# Patient Record
Sex: Female | Born: 1945 | Race: Black or African American | Hispanic: No | Marital: Married | Smoking: Former smoker
Health system: Southern US, Community
[De-identification: ages and names within clinical notes are randomized; demographics above are authoritative.]

## PROBLEM LIST (undated history)

## (undated) DIAGNOSIS — B0229 Other postherpetic nervous system involvement: Secondary | ICD-10-CM

## (undated) DIAGNOSIS — D869 Sarcoidosis, unspecified: Secondary | ICD-10-CM

## (undated) DIAGNOSIS — J449 Chronic obstructive pulmonary disease, unspecified: Secondary | ICD-10-CM

## (undated) DIAGNOSIS — Z9089 Acquired absence of other organs: Secondary | ICD-10-CM

## (undated) DIAGNOSIS — M1990 Unspecified osteoarthritis, unspecified site: Secondary | ICD-10-CM

## (undated) DIAGNOSIS — D860 Sarcoidosis of lung: Secondary | ICD-10-CM

## (undated) DIAGNOSIS — T7840XA Allergy, unspecified, initial encounter: Secondary | ICD-10-CM

## (undated) DIAGNOSIS — M810 Age-related osteoporosis without current pathological fracture: Secondary | ICD-10-CM

## (undated) DIAGNOSIS — H409 Unspecified glaucoma: Secondary | ICD-10-CM

## (undated) DIAGNOSIS — D649 Anemia, unspecified: Secondary | ICD-10-CM

## (undated) DIAGNOSIS — I10 Essential (primary) hypertension: Secondary | ICD-10-CM

## (undated) DIAGNOSIS — C569 Malignant neoplasm of unspecified ovary: Secondary | ICD-10-CM

## (undated) HISTORY — PX: DILATION AND CURETTAGE OF UTERUS: SHX78

## (undated) HISTORY — PX: VULVAR LESION REMOVAL: SHX5391

## (undated) HISTORY — DX: Allergy, unspecified, initial encounter: T78.40XA

## (undated) HISTORY — DX: Unspecified osteoarthritis, unspecified site: M19.90

## (undated) HISTORY — PX: ABDOMINAL HYSTERECTOMY: SHX81

## (undated) HISTORY — PX: EYE SURGERY: SHX253

## (undated) HISTORY — PX: DIAGNOSTIC LAPAROSCOPY: SUR761

## (undated) HISTORY — PX: BREAST EXCISIONAL BIOPSY: SUR124

## (undated) HISTORY — DX: Age-related osteoporosis without current pathological fracture: M81.0

---

## 1999-10-11 DIAGNOSIS — D860 Sarcoidosis of lung: Secondary | ICD-10-CM | POA: Insufficient documentation

## 2004-03-25 ENCOUNTER — Ambulatory Visit: Payer: Self-pay | Admitting: Internal Medicine

## 2005-04-08 ENCOUNTER — Ambulatory Visit: Payer: Self-pay | Admitting: Internal Medicine

## 2006-04-13 ENCOUNTER — Ambulatory Visit: Payer: Self-pay | Admitting: Internal Medicine

## 2007-04-21 ENCOUNTER — Ambulatory Visit: Payer: Self-pay | Admitting: Internal Medicine

## 2008-04-23 ENCOUNTER — Ambulatory Visit: Payer: Self-pay | Admitting: Internal Medicine

## 2008-11-19 ENCOUNTER — Ambulatory Visit: Payer: Self-pay | Admitting: Gastroenterology

## 2009-04-29 ENCOUNTER — Ambulatory Visit: Payer: Self-pay | Admitting: Internal Medicine

## 2009-05-31 ENCOUNTER — Emergency Department: Payer: Self-pay | Admitting: Emergency Medicine

## 2009-12-09 ENCOUNTER — Ambulatory Visit: Payer: Self-pay | Admitting: Internal Medicine

## 2009-12-11 ENCOUNTER — Ambulatory Visit: Payer: Self-pay | Admitting: Gynecologic Oncology

## 2009-12-11 DIAGNOSIS — C569 Malignant neoplasm of unspecified ovary: Secondary | ICD-10-CM | POA: Insufficient documentation

## 2009-12-17 ENCOUNTER — Ambulatory Visit: Payer: Self-pay | Admitting: Internal Medicine

## 2009-12-23 ENCOUNTER — Ambulatory Visit: Payer: Self-pay | Admitting: Gynecologic Oncology

## 2010-01-10 ENCOUNTER — Ambulatory Visit: Payer: Self-pay | Admitting: Gynecologic Oncology

## 2010-04-30 ENCOUNTER — Ambulatory Visit: Payer: Self-pay | Admitting: Internal Medicine

## 2011-05-04 ENCOUNTER — Ambulatory Visit: Payer: Self-pay | Admitting: Internal Medicine

## 2011-05-25 ENCOUNTER — Emergency Department: Payer: Self-pay | Admitting: Emergency Medicine

## 2011-05-25 LAB — BASIC METABOLIC PANEL
Anion Gap: 7 (ref 7–16)
BUN: 20 mg/dL — ABNORMAL HIGH (ref 7–18)
Calcium, Total: 9.8 mg/dL (ref 8.5–10.1)
Chloride: 98 mmol/L (ref 98–107)
Co2: 33 mmol/L — ABNORMAL HIGH (ref 21–32)
Creatinine: 0.72 mg/dL (ref 0.60–1.30)
EGFR (African American): >60
EGFR (Non-African Amer.): >60
Glucose: 87 mg/dL (ref 65–99)
Osmolality: 278 (ref 275–301)
Potassium: 4 mmol/L (ref 3.5–5.1)
Sodium: 138 mmol/L (ref 136–145)

## 2011-05-25 LAB — CBC WITH DIFFERENTIAL/PLATELET
Basophil #: 0 10*3/uL (ref 0.0–0.1)
Basophil %: 0.6 %
Eosinophil #: 0 10*3/uL (ref 0.0–0.7)
Eosinophil %: 0.6 %
HCT: 36.3 % (ref 35.0–47.0)
HGB: 11.7 g/dL — ABNORMAL LOW (ref 12.0–16.0)
Lymphocyte #: 0.6 10*3/uL — ABNORMAL LOW (ref 1.0–3.6)
Lymphocyte %: 9 %
MCH: 27.2 pg (ref 26.0–34.0)
MCHC: 32.3 g/dL (ref 32.0–36.0)
MCV: 84 fL (ref 80–100)
Monocyte #: 0.5 10*3/uL (ref 0.0–0.7)
Monocyte %: 8.1 %
Neutrophil #: 5.4 10*3/uL (ref 1.4–6.5)
Neutrophil %: 81.7 %
Platelet: 315 10*3/uL (ref 150–440)
RBC: 4.32 10*6/uL (ref 3.80–5.20)
RDW: 13.3 % (ref 11.5–14.5)
WBC: 6.6 10*3/uL (ref 3.6–11.0)

## 2011-05-25 LAB — SEDIMENTATION RATE: Erythrocyte Sed Rate: 48 mm/hr — ABNORMAL HIGH (ref 0–30)

## 2011-08-05 ENCOUNTER — Ambulatory Visit: Payer: Self-pay | Admitting: Pain Medicine

## 2011-08-16 ENCOUNTER — Ambulatory Visit: Payer: Self-pay | Admitting: Pain Medicine

## 2011-08-19 ENCOUNTER — Ambulatory Visit: Payer: Self-pay | Admitting: Pain Medicine

## 2011-08-31 ENCOUNTER — Emergency Department: Payer: Self-pay | Admitting: Internal Medicine

## 2011-08-31 LAB — CBC
HCT: 39.5 % (ref 35.0–47.0)
HGB: 12.1 g/dL (ref 12.0–16.0)
MCH: 26.8 pg (ref 26.0–34.0)
MCHC: 30.7 g/dL — ABNORMAL LOW (ref 32.0–36.0)
MCV: 87 fL (ref 80–100)
Platelet: 304 10*3/uL (ref 150–440)
RBC: 4.52 10*6/uL (ref 3.80–5.20)
RDW: 13.7 % (ref 11.5–14.5)
WBC: 16.3 10*3/uL — ABNORMAL HIGH (ref 3.6–11.0)

## 2011-08-31 LAB — COMPREHENSIVE METABOLIC PANEL
Albumin: 4.2 g/dL (ref 3.4–5.0)
Alkaline Phosphatase: 53 U/L (ref 50–136)
Anion Gap: 11 (ref 7–16)
BUN: 29 mg/dL — ABNORMAL HIGH (ref 7–18)
Bilirubin,Total: 0.4 mg/dL (ref 0.2–1.0)
Calcium, Total: 9.8 mg/dL (ref 8.5–10.1)
Chloride: 97 mmol/L — ABNORMAL LOW (ref 98–107)
Co2: 28 mmol/L (ref 21–32)
Creatinine: 0.82 mg/dL (ref 0.60–1.30)
EGFR (African American): >60
EGFR (Non-African Amer.): >60
Glucose: 114 mg/dL — ABNORMAL HIGH (ref 65–99)
Osmolality: 279 (ref 275–301)
Potassium: 3.4 mmol/L — ABNORMAL LOW (ref 3.5–5.1)
SGOT(AST): 27 U/L (ref 15–37)
SGPT (ALT): 21 U/L
Sodium: 136 mmol/L (ref 136–145)
Total Protein: 9 g/dL — ABNORMAL HIGH (ref 6.4–8.2)

## 2011-08-31 LAB — TROPONIN I: Troponin-I: <0.02 ng/mL

## 2011-08-31 LAB — URINALYSIS, COMPLETE
Bilirubin,UR: NEGATIVE
Blood: NEGATIVE
Glucose,UR: NEGATIVE mg/dL (ref 0–75)
Hyaline Cast: 6
Nitrite: NEGATIVE
Ph: 5 (ref 4.5–8.0)
Protein: 30
RBC,UR: 6 /HPF (ref 0–5)
Specific Gravity: 1.024 (ref 1.003–1.030)
Squamous Epithelial: 1
WBC UR: 6 /HPF (ref 0–5)

## 2011-09-07 ENCOUNTER — Ambulatory Visit: Payer: Self-pay | Admitting: Pain Medicine

## 2012-01-10 DIAGNOSIS — D071 Carcinoma in situ of vulva: Secondary | ICD-10-CM | POA: Insufficient documentation

## 2012-05-05 ENCOUNTER — Ambulatory Visit: Payer: Self-pay

## 2012-05-31 DIAGNOSIS — R87629 Unspecified abnormal cytological findings in specimens from vagina: Secondary | ICD-10-CM | POA: Insufficient documentation

## 2012-06-26 DIAGNOSIS — D072 Carcinoma in situ of vagina: Secondary | ICD-10-CM | POA: Insufficient documentation

## 2013-01-16 ENCOUNTER — Encounter: Payer: Self-pay | Admitting: Internal Medicine

## 2013-02-10 ENCOUNTER — Encounter: Payer: Self-pay | Admitting: Internal Medicine

## 2013-02-17 ENCOUNTER — Inpatient Hospital Stay: Payer: Self-pay

## 2013-02-17 LAB — CBC
HCT: 32.9 % — ABNORMAL LOW (ref 35.0–47.0)
HGB: 10.6 g/dL — ABNORMAL LOW (ref 12.0–16.0)
MCH: 26.3 pg (ref 26.0–34.0)
MCHC: 32.1 g/dL (ref 32.0–36.0)
MCV: 82 fL (ref 80–100)
Platelet: 388 10*3/uL (ref 150–440)
RBC: 4.01 10*6/uL (ref 3.80–5.20)
RDW: 13.9 % (ref 11.5–14.5)
WBC: 8.5 10*3/uL (ref 3.6–11.0)

## 2013-02-17 LAB — COMPREHENSIVE METABOLIC PANEL
Albumin: 3.3 g/dL — ABNORMAL LOW (ref 3.4–5.0)
Alkaline Phosphatase: 68 U/L (ref 50–136)
Anion Gap: 3 — ABNORMAL LOW (ref 7–16)
BUN: 19 mg/dL — ABNORMAL HIGH (ref 7–18)
Bilirubin,Total: 0.3 mg/dL (ref 0.2–1.0)
Calcium, Total: 9.2 mg/dL (ref 8.5–10.1)
Chloride: 99 mmol/L (ref 98–107)
Co2: 35 mmol/L — ABNORMAL HIGH (ref 21–32)
Creatinine: 0.88 mg/dL (ref 0.60–1.30)
EGFR (African American): >60
EGFR (Non-African Amer.): >60
Glucose: 75 mg/dL (ref 65–99)
Osmolality: 275 (ref 275–301)
Potassium: 3.9 mmol/L (ref 3.5–5.1)
SGOT(AST): 23 U/L (ref 15–37)
SGPT (ALT): 21 U/L (ref 12–78)
Sodium: 137 mmol/L (ref 136–145)
Total Protein: 7.7 g/dL (ref 6.4–8.2)

## 2013-02-17 LAB — TROPONIN I: Troponin-I: <0.02 ng/mL

## 2013-02-17 LAB — PROTIME-INR
INR: 1
Prothrombin Time: 13.2 secs (ref 11.5–14.7)

## 2013-02-17 LAB — CK TOTAL AND CKMB (NOT AT ARMC)
CK, Total: 66 U/L (ref 21–215)
CK-MB: 0.7 ng/mL (ref 0.5–3.6)

## 2013-02-18 LAB — CBC WITH DIFFERENTIAL/PLATELET
Basophil #: 0 10*3/uL (ref 0.0–0.1)
Basophil %: 0.2 %
Eosinophil #: 0 10*3/uL (ref 0.0–0.7)
Eosinophil %: 0 %
HCT: 32 % — ABNORMAL LOW (ref 35.0–47.0)
HGB: 10.2 g/dL — ABNORMAL LOW (ref 12.0–16.0)
Lymphocyte #: 0.4 10*3/uL — ABNORMAL LOW (ref 1.0–3.6)
Lymphocyte %: 3.9 %
MCH: 26.1 pg (ref 26.0–34.0)
MCHC: 31.8 g/dL — ABNORMAL LOW (ref 32.0–36.0)
MCV: 82 fL (ref 80–100)
Monocyte #: 0.5 x10 3/mm (ref 0.2–0.9)
Monocyte %: 4.7 %
Neutrophil #: 10.4 10*3/uL — ABNORMAL HIGH (ref 1.4–6.5)
Neutrophil %: 91.2 %
Platelet: 395 10*3/uL (ref 150–440)
RBC: 3.9 10*6/uL (ref 3.80–5.20)
RDW: 13.9 % (ref 11.5–14.5)
WBC: 11.4 10*3/uL — ABNORMAL HIGH (ref 3.6–11.0)

## 2013-02-18 LAB — BASIC METABOLIC PANEL
Anion Gap: 1 — ABNORMAL LOW (ref 7–16)
BUN: 19 mg/dL — ABNORMAL HIGH (ref 7–18)
Calcium, Total: 9.3 mg/dL (ref 8.5–10.1)
Chloride: 100 mmol/L (ref 98–107)
Co2: 35 mmol/L — ABNORMAL HIGH (ref 21–32)
Creatinine: 0.81 mg/dL (ref 0.60–1.30)
EGFR (African American): >60
EGFR (Non-African Amer.): >60
Glucose: 120 mg/dL — ABNORMAL HIGH (ref 65–99)
Osmolality: 275 (ref 275–301)
Potassium: 3.7 mmol/L (ref 3.5–5.1)
Sodium: 136 mmol/L (ref 136–145)

## 2013-02-22 LAB — CULTURE, BLOOD (SINGLE)

## 2013-03-12 ENCOUNTER — Encounter: Payer: Self-pay | Admitting: Internal Medicine

## 2013-04-12 ENCOUNTER — Encounter: Payer: Self-pay | Admitting: Internal Medicine

## 2013-05-07 ENCOUNTER — Ambulatory Visit: Payer: Self-pay

## 2013-05-13 ENCOUNTER — Encounter: Payer: Self-pay | Admitting: Internal Medicine

## 2013-06-10 ENCOUNTER — Encounter: Payer: Self-pay | Admitting: Internal Medicine

## 2013-10-05 DIAGNOSIS — I272 Pulmonary hypertension, unspecified: Secondary | ICD-10-CM | POA: Insufficient documentation

## 2013-10-09 ENCOUNTER — Ambulatory Visit: Payer: Self-pay | Admitting: Cardiology

## 2013-12-15 ENCOUNTER — Emergency Department: Payer: Self-pay | Admitting: Emergency Medicine

## 2013-12-15 LAB — CBC WITH DIFFERENTIAL/PLATELET
Basophil #: 0 10*3/uL (ref 0.0–0.1)
Basophil %: 0.4 %
Eosinophil #: 0 10*3/uL (ref 0.0–0.7)
Eosinophil %: 0.2 %
HCT: 36.5 % (ref 35.0–47.0)
HGB: 11.5 g/dL — ABNORMAL LOW (ref 12.0–16.0)
Lymphocyte #: 0.4 10*3/uL — ABNORMAL LOW (ref 1.0–3.6)
Lymphocyte %: 3.2 %
MCH: 27.1 pg (ref 26.0–34.0)
MCHC: 31.4 g/dL — ABNORMAL LOW (ref 32.0–36.0)
MCV: 86 fL (ref 80–100)
Monocyte #: 0.6 x10 3/mm (ref 0.2–0.9)
Monocyte %: 5.4 %
Neutrophil #: 10.2 10*3/uL — ABNORMAL HIGH (ref 1.4–6.5)
Neutrophil %: 90.8 %
Platelet: 365 10*3/uL (ref 150–440)
RBC: 4.23 10*6/uL (ref 3.80–5.20)
RDW: 13.7 % (ref 11.5–14.5)
WBC: 11.3 10*3/uL — ABNORMAL HIGH (ref 3.6–11.0)

## 2013-12-15 LAB — COMPREHENSIVE METABOLIC PANEL
Albumin: 3.8 g/dL (ref 3.4–5.0)
Alkaline Phosphatase: 48 U/L
Anion Gap: 5 — ABNORMAL LOW (ref 7–16)
BUN: 21 mg/dL — ABNORMAL HIGH (ref 7–18)
Bilirubin,Total: 0.4 mg/dL (ref 0.2–1.0)
Calcium, Total: 9.5 mg/dL (ref 8.5–10.1)
Chloride: 97 mmol/L — ABNORMAL LOW (ref 98–107)
Co2: 35 mmol/L — ABNORMAL HIGH (ref 21–32)
Creatinine: 0.96 mg/dL (ref 0.60–1.30)
EGFR (African American): >60
EGFR (Non-African Amer.): >60
Glucose: 98 mg/dL (ref 65–99)
Osmolality: 277 (ref 275–301)
Potassium: 3.8 mmol/L (ref 3.5–5.1)
SGOT(AST): 29 U/L (ref 15–37)
SGPT (ALT): 26 U/L
Sodium: 137 mmol/L (ref 136–145)
Total Protein: 7.8 g/dL (ref 6.4–8.2)

## 2013-12-15 LAB — URINALYSIS, COMPLETE
Bacteria: NONE SEEN
Bilirubin,UR: NEGATIVE
Blood: NEGATIVE
Glucose,UR: NEGATIVE mg/dL (ref 0–75)
Hyaline Cast: 3
Ketone: NEGATIVE
Nitrite: NEGATIVE
Ph: 7 (ref 4.5–8.0)
Protein: NEGATIVE
RBC,UR: NONE SEEN /HPF (ref 0–5)
Specific Gravity: 1.01 (ref 1.003–1.030)
Squamous Epithelial: 8
WBC UR: 2 /HPF (ref 0–5)

## 2013-12-15 LAB — TROPONIN I
Troponin-I: <0.02 ng/mL
Troponin-I: <0.02 ng/mL

## 2013-12-15 LAB — LIPASE, BLOOD: Lipase: 116 U/L (ref 73–393)

## 2014-01-15 ENCOUNTER — Encounter: Payer: Self-pay | Admitting: Internal Medicine

## 2014-02-10 ENCOUNTER — Encounter: Payer: Self-pay | Admitting: Internal Medicine

## 2014-03-12 ENCOUNTER — Encounter: Payer: Self-pay | Admitting: Internal Medicine

## 2014-04-12 ENCOUNTER — Encounter: Payer: Self-pay | Admitting: Internal Medicine

## 2014-05-13 ENCOUNTER — Encounter: Payer: Self-pay | Admitting: Internal Medicine

## 2014-05-14 ENCOUNTER — Ambulatory Visit: Payer: Self-pay

## 2014-05-18 ENCOUNTER — Inpatient Hospital Stay: Payer: Self-pay | Admitting: Internal Medicine

## 2014-05-18 LAB — COMPREHENSIVE METABOLIC PANEL
Albumin: 3.1 g/dL — ABNORMAL LOW (ref 3.4–5.0)
Alkaline Phosphatase: 64 U/L (ref 46–116)
Anion Gap: 7 (ref 7–16)
BUN: 22 mg/dL — ABNORMAL HIGH (ref 7–18)
Bilirubin,Total: 0.3 mg/dL (ref 0.2–1.0)
Calcium, Total: 9.7 mg/dL (ref 8.5–10.1)
Chloride: 93 mmol/L — ABNORMAL LOW (ref 98–107)
Co2: 34 mmol/L — ABNORMAL HIGH (ref 21–32)
Creatinine: 0.72 mg/dL (ref 0.60–1.30)
EGFR (African American): >60
EGFR (Non-African Amer.): >60
Glucose: 109 mg/dL — ABNORMAL HIGH (ref 65–99)
Osmolality: 272 (ref 275–301)
Potassium: 3.7 mmol/L (ref 3.5–5.1)
SGOT(AST): 34 U/L (ref 15–37)
SGPT (ALT): 42 U/L (ref 14–63)
Sodium: 134 mmol/L — ABNORMAL LOW (ref 136–145)
Total Protein: 8 g/dL (ref 6.4–8.2)

## 2014-05-18 LAB — CBC WITH DIFFERENTIAL/PLATELET
Basophil #: 0.2 10*3/uL — ABNORMAL HIGH (ref 0.0–0.1)
Basophil %: 0.6 %
Eosinophil #: 0 10*3/uL (ref 0.0–0.7)
Eosinophil %: 0 %
HCT: 34.5 % — ABNORMAL LOW (ref 35.0–47.0)
HGB: 10.7 g/dL — ABNORMAL LOW (ref 12.0–16.0)
Lymphocyte #: 0.3 10*3/uL — ABNORMAL LOW (ref 1.0–3.6)
Lymphocyte %: 1.1 %
MCH: 25.6 pg — ABNORMAL LOW (ref 26.0–34.0)
MCHC: 31.1 g/dL — ABNORMAL LOW (ref 32.0–36.0)
MCV: 82 fL (ref 80–100)
Monocyte #: 1 x10 3/mm — ABNORMAL HIGH (ref 0.2–0.9)
Monocyte %: 3.6 %
Neutrophil #: 25.8 10*3/uL — ABNORMAL HIGH (ref 1.4–6.5)
Neutrophil %: 94.7 %
Platelet: 430 10*3/uL (ref 150–440)
RBC: 4.19 10*6/uL (ref 3.80–5.20)
RDW: 13.7 % (ref 11.5–14.5)
WBC: 27.3 10*3/uL — ABNORMAL HIGH (ref 3.6–11.0)

## 2014-05-18 LAB — TROPONIN I: Troponin-I: <0.02 ng/mL

## 2014-05-19 LAB — CBC WITH DIFFERENTIAL/PLATELET
Basophil #: 0 10*3/uL (ref 0.0–0.1)
Basophil %: 0.1 %
Eosinophil #: 0 10*3/uL (ref 0.0–0.7)
Eosinophil %: 0 %
HCT: 31.7 % — ABNORMAL LOW (ref 35.0–47.0)
HGB: 9.8 g/dL — ABNORMAL LOW (ref 12.0–16.0)
Lymphocyte #: 0.4 10*3/uL — ABNORMAL LOW (ref 1.0–3.6)
Lymphocyte %: 1.9 %
MCH: 25.4 pg — ABNORMAL LOW (ref 26.0–34.0)
MCHC: 30.9 g/dL — ABNORMAL LOW (ref 32.0–36.0)
MCV: 82 fL (ref 80–100)
Monocyte #: 0.4 x10 3/mm (ref 0.2–0.9)
Monocyte %: 1.9 %
Neutrophil #: 19.1 10*3/uL — ABNORMAL HIGH (ref 1.4–6.5)
Neutrophil %: 96.1 %
Platelet: 411 10*3/uL (ref 150–440)
RBC: 3.86 10*6/uL (ref 3.80–5.20)
RDW: 13.5 % (ref 11.5–14.5)
WBC: 19.9 10*3/uL — ABNORMAL HIGH (ref 3.6–11.0)

## 2014-05-19 LAB — BASIC METABOLIC PANEL
Anion Gap: 2 — ABNORMAL LOW (ref 7–16)
BUN: 20 mg/dL — ABNORMAL HIGH (ref 7–18)
Calcium, Total: 9.6 mg/dL (ref 8.5–10.1)
Chloride: 97 mmol/L — ABNORMAL LOW (ref 98–107)
Co2: 39 mmol/L — ABNORMAL HIGH (ref 21–32)
Creatinine: 0.8 mg/dL (ref 0.60–1.30)
EGFR (African American): >60
EGFR (Non-African Amer.): >60
Glucose: 124 mg/dL — ABNORMAL HIGH (ref 65–99)
Osmolality: 280 (ref 275–301)
Potassium: 4 mmol/L (ref 3.5–5.1)
Sodium: 138 mmol/L (ref 136–145)

## 2014-05-20 LAB — CBC WITH DIFFERENTIAL/PLATELET
Basophil #: 0.1 10*3/uL (ref 0.0–0.1)
Basophil %: 0.4 %
Eosinophil #: 0 10*3/uL (ref 0.0–0.7)
Eosinophil %: 0 %
HCT: 29.8 % — ABNORMAL LOW (ref 35.0–47.0)
HGB: 9.3 g/dL — ABNORMAL LOW (ref 12.0–16.0)
Lymphocyte #: 0.3 10*3/uL — ABNORMAL LOW (ref 1.0–3.6)
Lymphocyte %: 1 %
MCH: 25.7 pg — ABNORMAL LOW (ref 26.0–34.0)
MCHC: 31.2 g/dL — ABNORMAL LOW (ref 32.0–36.0)
MCV: 82 fL (ref 80–100)
Monocyte #: 0.9 x10 3/mm (ref 0.2–0.9)
Monocyte %: 3.3 %
Neutrophil #: 25.6 10*3/uL — ABNORMAL HIGH (ref 1.4–6.5)
Neutrophil %: 95.3 %
Platelet: 401 10*3/uL (ref 150–440)
RBC: 3.62 10*6/uL — ABNORMAL LOW (ref 3.80–5.20)
RDW: 13.9 % (ref 11.5–14.5)
WBC: 26.9 10*3/uL — ABNORMAL HIGH (ref 3.6–11.0)

## 2014-05-20 LAB — CULTURE, BLOOD (SINGLE)

## 2014-06-11 ENCOUNTER — Encounter: Payer: Self-pay | Admitting: Internal Medicine

## 2014-08-02 NOTE — H&P (Signed)
PATIENT NAME:  Autumn [name], SCHRECKENGOST I MR#:  [id number] DATE OF BIRTH:  1945-09-08  DATE OF ADMISSION:  02/17/2013  REFERRING PHYSICIAN: Sheryl L. Benjaman Lobe, MD  PRIMARY CARE PHYSICIAN: Cheral Marker. Ola Spurr, MD, at [facility].  PRIMARY PULMONOLOGIST: [facility].  CHIEF COMPLAINT: Shortness of breath.   HISTORY OF PRESENT ILLNESS: The patient is a pleasant 69 year old African-American female with a history of COPD and sarcoidosis. She presented to the hospital after experiencing significant shortness of breath and dyspnea on exertion than her baseline. Her symptoms started about 2 to 3 days ago with dry cough and rhinorrhea. The cough is persisting, but the rhinorrhea has resolved. The patient has had no sick contacts. No recent travels. No fevers or chills. Of note, the patient is immunosuppressed as she is on chronic steroid therapy for her sarcoidosis. She states that when she wakes up, she is usually short of breath for a little bit, and then the shortness of breath improves during the day. However, the last several days, the shortness of breath has persisted throughout, and she feels that even while she talks for some extended periods of time, she is short of breath. She came into the hospital therefore, and although she has no fever, she was noted to have pneumonia bilaterally on x-ray of the chest, and hospitalist services were contacted for further evaluation and management.   PAST MEDICAL HISTORY: History of sarcoidosis, COPD, ovarian cancer, status post chemotherapy,  history of shingles and posthepatic neuralgia as well as hypertension.  SURGERIES: Vulva surgery.   ALLERGIES: IV DYE AND IODINE.   FAMILY HISTORY: Dad had glaucoma and multiple pneumonias.   SOCIAL HISTORY: Ex-smoker. No alcohol or drug use.   OUTPATIENT MEDICATIONS:  1. Advair 100/50 mcg 1 puff 2 times a day. 2. Albuterol p.r.n. nebulizers 2 puffs 4 times a day. 3. Aleve 220 mg every 8 hours as needed.  4. Atenolol 25 mg 2  times a day.  5. Brimonidine tartrate 0.15% eye solution.  6. Duloxetine 60 mg daily.  7. Gabapentin 300 mg 3 caps 2 times a day.  8. Hydrochlorothiazide/triamterene 25/37.5 one cap once a day.  9. Latanoprost 0.005% ophthalmic solution 1 drop to each eye once a day at bedtime. 10. Nortriptyline 75 mg daily.  11. Prednisone 5 mg daily. 12. ProAir 2 puffs 4 times a day as needed.   REVIEW OF SYSTEMS: CONSTITUTIONAL: Positive for some weight gain recently. Her appetite is good. No fever.  EYES: No blurry vision or double vision.  ENT: No tinnitus or hearing loss. No seasonal allergies. Had some rhinorrhea for a brief period that has resolved. No sore throat.  RESPIRATORY: Positive for dry cough, significant shortness of breath and dyspnea on exertion more so than her baseline. No orthopnea or swelling in the legs.  CARDIOVASCULAR: Denies chest pain. No orthopnea. Has a history of murmur. Although she does not think she has hypertension, she is on a blood pressure medication.  GASTROINTESTINAL: No nausea, vomiting, abdominal pain, bloody stools or dark stools. No rectal bleeding.  ENDOCRINE: No polyuria or nocturia. HEMATOLOGIC AND LYMPHATIC: No anemia or easy bruising.  SKIN: No rashes.  MUSCULOSKELETAL: Denies arthritis or gout.  NEUROLOGIC: Denies focal weakness or numbness.  PSYCHIATRIC: Has history of depression.   PHYSICAL EXAMINATION:  VITAL SIGNS: Temperature on arrival 97.9, pulse rate 81, respiratory rate 18, blood pressure 106/59. O2 saturation: It is stated that it is 98% on room air, but I think she was on oxygen per ER physician while the  vitals were taken.  GENERAL: The patient is a well-developed African-American female sitting in bed in no obvious distress.  HEENT: Normocephalic, atraumatic. Pupils are equal and reactive. Anicteric sclerae. Extraocular muscles intact. Moist mucous membranes.  NECK: Supple. No thyroid tenderness or cervical adenopathy.  CARDIOVASCULAR: S1,  S2, irregularly irregular. Positive for 4/6 murmur at left sternum.  LUNGS: The patient has mild to moderate wheezing mostly at the bases and some rhonchi at the left mid lung.  ABDOMEN: Soft, nontender, nondistended. Positive bowel sounds in all quadrants.  EXTREMITIES: No significant lower extremity edema.  NEUROLOGIC: Cranial nerves II through XII grossly intact. Strength is 5 out of 5 in all extremities. Sensation is intact to light touch.  PSYCHIATRIC: Awake, alert and oriented x3. Pleasant, cooperative.   LABORATORIES AND IMAGING: Chest, PA and lateral, x-ray showing streaky airspace disease with central distribution bilaterally, suspicious for infiltrate/pneumonia and also chronic interstitial prominence, without convincing pulmonary edema. INR is 1. Lactic acid of 2.1. The pH on the ABG shows 7.43, pCO2 of 56, pO2 of 56. WBC of 8.5, hemoglobin 10.6 and platelets 388. Troponin negative. LFTs: Albumin of 3.3, otherwise within normal limits. BUN 19, creatinine 0.88. EKG: Sinus with first-degree AV block, nonspecific ST changes, rate is 81, QRS duration is 84. No acute ST elevations or depressions.   ASSESSMENT AND PLAN: We have a 69 year old female with a history of pulmonary sarcoid, chronic obstructive pulmonary disease, who does go to [place] for pulmonary rehabilitation, not on oxygen at baseline, who comes in for shortness of breath, more so than her baseline, nonproductive cough and is noted to have bilateral pneumonia. She has been given ceftriaxone and a dose of Levaquin here. Given that she is immunocompromised, even though she does not have a fever or significant leukocytosis, which I believe could potentially be secondary to being on steroids chronically, I would bring her into the hospital and start her on IV antibiotics. I would follow with the blood cultures, send in sputum cultures and start the patient on Levaquin with standing IV Solu-Medrol, oxygen as needed. Would also start her on  p.r.n. nebulizers and see how she does. Given that she is immunocompromised, she possibly might not be able to mount a significant response, and therefore the need for admission. We would follow her symptoms and consider another x-ray of the chest if she does not improve. The patient does have mild lactic acidosis likely in the setting of pneumonia as well, and I will go ahead and start her on some IV fluids. Hold the diuretic at this point as she does have borderline blood pressure, but continue the atenolol for her heart, and it is a low dose nonetheless. I would hold Advair at this point. Continue her outpatient depression and neuropathic pain medications as well. I will start her on deep vein thrombosis prophylaxis, put her on some cough medicine as needed.   CODE STATUS: The patient is full code.   TOTAL TIME SPENT: 50 minutes.   ____________________________ Vivien Presto, MD sa:lb D: 02/17/2013 14:34:26 ET T: 02/17/2013 15:04:37 ET JOB#: [id number]  cc: Vivien Presto, MD, <Dictator> Cheral Marker. Ola Spurr, MD Karel Jarvis [facility] MD ELECTRONICALLY SIGNED 03/01/2013 7:04

## 2014-08-02 NOTE — Discharge Summary (Signed)
PATIENT NAME:  Autumn [name], Autumn [name] MR#:  [id number] DATE OF BIRTH:  [birth year]  DATE OF ADMISSION:  02/17/2013 DATE OF DISCHARGE:  02/21/2013  DISCHARGE DIAGNOSES:  1.  Acute on chronic respiratory failure.  2.  Chronic obstructive pulmonary disease.  3.  Advanced sarcoid.  CONSULTING PULMONOLOGIST: Dr. Raul Del.  PRIMARY CARE PHYSICIAN: Dr. Adrian Prows.  HISTORY AND PHYSICAL: Please see initial history and physical for details. Briefly, this is a 69 year old with COPD and advanced sarcoid followed at [facility] pulmonary. She developed increasing shortness of breath, dyspnea on exertion and a dry cough. She had a chest x-ray that showed worsening disease as well as possible pneumonia bilaterally. On admission, she was hypoxic. ABG showed pH 7.43, pCO2 56 and pO2 56 on room air. White blood count was normal.  HOSPITAL COURSE BY ISSUE:  1.  Acute on chronic pulmonary failure. The patient was started on IV Solu-Medrol as well as levofloxacin. She was continued on her outpatient inhalers and nebulizers. She had clinical improvement in her cough and shortness of breath; however, she remained hypoxic. She had a CT scan done without contrast due to her severe allergy. This showed progressive sarcoid disease as well as a tree and bud pattern potentially consistent with atypical infection. Pulmonary recommended continuing her steroids and starting her on home oxygen and having her follow up with [facility] pulmonary. We will arrange that and also send with her a copy of her CT scan.  2.  Hypertension. She remained hypertensive on the atenolol and Maxzide she is on as an outpatient. We did add Norvasc 5 mg once a day and she will continue on this.   DISCHARGE INSTRUCTIONS: The patient will call if she has any worsening chest pain, shortness of breath, fevers or other concerning symptoms.   DISCHARGE MEDICATIONS:  1.  Atenolol 25 mg twice a day.  2.  Maxzide 25/37.5 once a day.  3.  Advair 100/50 mg 1 puff twice a  day.  4.  Latanoprost 0.005 one drop to affected eye once a day.  5.  Gabapentin 300 mg 2 capsules twice a day.  6.  Fluoxetine 60 mg once a day.  7.  Nortriptyline 75 mg once a day.  8.  Brimonidine tartrate 0.15 eye solution.  9.  Aleve 220 mg every 8 hours as needed.  10. Albuterol inhaler 2 puffs 4 times a day. 11. Prednisone 20 mg taper, with 3 tablets a day for 3 days, then 2 tablets a day for 3 days, then 1 tablet a day for 3 days, then 1/2 tablet for 3 days and then resume the home 5 mg dose.  12. Guaifenesin 10 mL every 6 hours as needed for cough.  13. Amlodipine 5 mg once a day.  14. Levofloxacin 750 mg once a day.   HOME OXYGEN: The patient will be on home oxygen 2 L per nasal cannula.   DISCHARGE DIET: Low sodium, regular consistency.   DISCHARGE FOLLOWUP: The patient will follow up with Dr. Ola Spurr in 1 to 2 weeks and with pulmonary at [facility] in 1 to 2 weeks.   TIME SPENT: This discharge took 40 minutes.  ____________________________ Autumn [name]. Ola Spurr, MD dpf:aw D: 02/21/2013 08:34:08 ET T: 02/21/2013 08:50:34 ET JOB#: [id number]  cc: Autumn [name]. Ola Spurr, MD, <Dictator> Peggie Hornak Ola Spurr MD ELECTRONICALLY SIGNED 02/21/2013 21:06

## 2014-08-11 NOTE — H&P (Signed)
PATIENT NAME:  Autumn [name], Autumn [name] MR#:  [id number] DATE OF BIRTH:  09-02-1945  DATE OF ADMISSION:  05/18/2014  REFERRING PHYSICIAN: Ahmed Prima, MD.   FAMILY PHYSICIAN: Cheral Marker. Ola Spurr, MD.   REASON FOR ADMISSION: Acute on chronic respiratory failure.   HISTORY OF PRESENT ILLNESS: The patient is an 69 year old female with chronic respiratory failure on 3 liters of oxygen at home. The patient has a history of sarcoidosis and COPD followed by Dr. Cyndie Chime at [facility]. Also has a history of benign hypertension and previous ovarian cancer status post hysterectomy/oophorectomy. Has been having issues with shortness of breath and cough over the past 1 to 2 weeks. Was given prednisone and Levaquin as an outpatient. She presents to the emergency room with persistent cough and congestion with worsening shortness of breath. In the emergency room, the patient was noted to be febrile with an elevated white count. She is now admitted for further evaluation.   PAST MEDICAL HISTORY: 1. COPD.  2. Sarcoidosis.  3. Chronic respiratory failure, on oxygen.  4. History of ovarian cancer.  5. History of herpes zoster.  6. Glaucoma.  7. Benign hypertension.  8. Hyperlipidemia.  9. Status post hysterectomy/oophorectomy.   MEDICATIONS: 1. Vitamin D3 - 5000 units p.o. daily.  2. Zoloft 25 mg p.o. daily.  3. Prednisone 10 mg p.o. daily.  4. Lipitor 10 mg p.o. at bedtime.  5. Latanoprost eyedrops as directed.  6. Dyazide 1 p.o. daily.  7. Gabapentin 900 mg p.o. b.i.d.  8. Atenolol 25 mg p.o. b.i.d.  9. Advair 250/50, 1 puff b.i.d.   ALLERGIES: IVP DYE AND IODINE.   SOCIAL HISTORY: Negative for alcohol or tobacco abuse.   FAMILY HISTORY: Positive for diabetes and stroke. Negative for colon or breast cancer.   REVIEW OF SYSTEMS:  CONSTITUTIONAL: The patient has had fever, but no change in weight.   EYES: No blurred or double vision. Does have glaucoma which is monitored closely.   ENT: No tinnitus  or hearing loss. No nasal discharge or bleeding. No difficulty swallowing.   RESPIRATORY: The patient has had cough and wheezing, but denies hemoptysis. No painful respiration.   CARDIOVASCULAR: No chest pain or palpitations. No syncope.   GASTROINTESTINAL: Some nausea, but no vomiting or diarrhea. No abdominal pain or change in bowel habits.   GENITOURINARY: No dysuria or hematuria. No incontinence.   ENDOCRINE: No polyuria or polydipsia. No heat or cold intolerance.   HEMATOLOGIC: The patient denies anemia, easy bruising or bleeding.   LYMPHATIC: No swollen glands.   MUSCULOSKELETAL: The patient denies pain in her neck, back, shoulders, knees or hips. No gout.   NEUROLOGIC: No numbness or migraines. Denies stroke or seizures.   PSYCHOLOGICAL: The patient denies anxiety, insomnia, depression.   PHYSICAL EXAMINATION: GENERAL: The patient is chronically ill-appearing, in mild respiratory distress.   VITAL SIGNS: Currently remarkable for a blood pressure of 134/77 with a heart rate of 97 and a respiratory rate of 24 with a temperature of 99.8 and a saturation of 96% on 3 liters.   HEENT: Normocephalic, atraumatic. Pupils equally round and reactive to light and accommodation. Extraocular movements are intact. Sclerae are anicteric. Conjunctivae are clear. Oropharynx is dry but clear.   NECK: Supple without JVD. No adenopathy or thyromegaly is noted.   LUNGS: Reveal diffuse wheezes and rhonchi. No dullness. Respiratory effort is increased.   CARDIAC: Regular rate and rhythm. Normal S1, S2. No scuba rubs, murmurs or gallops. PMI is nondisplaced. Chest wall is nontender.  ABDOMEN: Soft, nontender with normoactive bowel sounds. No organomegaly or masses were appreciated. No hernias or bruits were noted.   EXTREMITIES: Without clubbing, cyanosis, edema. Pulses were 2+ bilaterally.   SKIN: Warm and dry without rash or lesions.   NEUROLOGIC: Cranial nerves 2 through 12 grossly intact.  Deep tendon reflexes were symmetric. Motor and sensory exam is nonfocal.   PSYCHIATRIC: Revealed a patient who was alert and oriented to person, place and time. She was cooperative and used good judgment.   DIAGNOSTIC DATA: Chest x-ray reveals increased markings in the right mid lung suggestive of a developing pneumonia.   LABORATORY DATA: Her white count is 27.3 with a hemoglobin of 10.7. Glucose 109 with a BUN of 22 and a creatinine of 0.72 with a sodium of 134 and a GFR of greater than 60. Troponin was less than 0.02.   ASSESSMENT: 1. Acute on chronic respiratory failure.  2. Developing pneumonia.  3. Chronic obstructive pulmonary disease exacerbation.  4. Anemia of chronic disease.  5. Sarcoidosis.   PLAN: The patient will be admitted to the floor with oxygen, IV steroids, IV antibiotics and DuoNeb SVNs. We will monitor her sugars while on steroids. We will continue to maximize her pulmonary toilet. Blood cultures have been sent. Wean oxygen as tolerated. Follow up labs and chest x-ray in the morning. Further treatment and evaluation will depend upon the patient's progress.   TOTAL TIME SPENT: On this patient was 50 minutes.    ____________________________ Leonie Douglas Doy Hutching, MD jds:TT D: 05/18/2014 15:36:54 ET T: 05/18/2014 15:52:43 ET JOB#: [id number]  cc: Leonie Douglas. Doy Hutching, MD, <Dictator> Cheral Marker. Ola Spurr, MD Lillian Ballester Lennice Sites MD ELECTRONICALLY SIGNED 05/19/2014 13:34

## 2014-08-11 NOTE — Discharge Summary (Signed)
PATIENT NAME:  Autumn [name], Autumn [name] MR#:  [id number] DATE OF BIRTH:  May 31, 1945  DATE OF ADMISSION:  05/18/2014 DATE OF DISCHARGE:  05/20/2014  DISCHARGE DIAGNOSES: 1. Chronic obstructive pulmonary disease exacerbation.  2. Pneumonia.  3. Hypertension.  4. Hyperlipidemia.  5. Sarcoidosis.  6. Leukocytosis due to sarcoid.  MEDICATIONS ON DISCHARGE:  1. Atenolol 25 mg 2 times a day.  2. Hydrochlorothiazide/triamterene 25/37.5 mg once a day.  3. Latanoprost 0.005% ophthalmic solution each eye at bedtime.  4. Advair Diskus 1 puff inhalation 2 times a day.  5. Vitamin D3 5000 international units capsule once a day.  6. Gabapentin 300 mg 3 capsules 2 times a day.  7. Sertraline 25 mg 1 tablet once a day.  8. Atorvastatin 10 mg once a day.  9. Prednisone 20 mg tablet 3 tablets for 5 days, then 2 tablets daily for 5 days, and then 1 tablet once a day for 5 days, and then continue 1/2 tablet once a day.  10. Prednisone 10 mg tablet 1 tablet once a day. After finishing tapering steroids.  11. Albuterol 3 mm inhalation every 6 hours as needed for shortness of breath.  13. Spiriva 18 mcg inhalation capsule once a day.  14. Levaquin 500 mg daily for 3 days.   DIET ON DISCHARGE: Low-sodium, low-fat, low-cholesterol diet with regular consistency.   ACTIVITY: As tolerated.   FOLLOWUP INSTRUCTIONS: Advised to follow in 1-2 weeks with primary care physician,  Cheral Marker. Ola Spurr, MD  HISTORY OF PRESENTING ILLNESS: A 69 year old female with chronic respiratory failure on 3 liters oxygen at home, with sarcoidosis and COPD, history of benign hypertension, and previous ovarian cancer, status post hysterectomy and oophorectomy, had been having issues with shortness of breath and cough over 1-2 weeks, so she was given prednisone and Levaquin as outpatient. Presented to Emergency Room with persistent cough and congestion with worsening shortness of breath. In the Emergency Room, she was noted to be febrile with an  elevated white cell count, and so she was admitted for further evaluation.   HOSPITAL COURSE AND STAY:  1. Chronic obstructive pulmonary disease exacerbation. This was secondary to pneumonia, as it was suspected by chest x-ray. The patient was continued on IV steroid, nebulizer treatment, Spiriva, and Advair. She was also started on erythromycin and ceftriaxone for pneumonia, which we changed to Levaquin on discharge, and the patient was already on home oxygen. After improvement, we switched steroid to oral and arranged for nebulizer at home  2. History of sarcoid. She was on chronic prednisone, so we advised tapering steroid for chronic obstructive pulmonary disease and then continue her baseline steroid for sarcoid.  3. Hypertension. Hydrochlorothiazide/triamterene and atenolol were able to control it, and we continued the same.  4. Hyperlipidemia. We continued statin.  5. Depression. Continued Zoloft.  6. Glaucoma. We continued her eyedrops, which she was taking at home.   [place]. Chest x-ray, PA and lateral, showed increased patchy opacity in mid right lung on frontal view. WBC count on presentation 27.3, hemoglobin 10.7, platelet count 430,000, and MCV 82. On admission, BUN 22, creatine 0.72, sodium 134, potassium 3.7, and chloride 93. Troponin less than 0.02. Blood culture: No growth since admission. WBC count came down to 19.9 on next day on follow-up.   TOTAL TIME SPENT ON THIS DISCHARGE: 35 minutes dizzy.    ____________________________ Ceasar Lund Anselm Jungling, MD vgv:mw D: 05/23/2014 09:09:20 ET T: 05/23/2014 14:03:47 ET JOB#: [id number]  cc: Ceasar Lund. Anselm Jungling,  MD, <Dictator> Cheral Marker. Ola Spurr, MD Rosalio Macadamia [facility] MD ELECTRONICALLY SIGNED 05/29/2014 15:57

## 2015-02-14 DIAGNOSIS — Z7952 Long term (current) use of systemic steroids: Secondary | ICD-10-CM | POA: Insufficient documentation

## 2015-03-29 ENCOUNTER — Emergency Department
Admission: EM | Admit: 2015-03-29 | Discharge: 2015-03-29 | Disposition: A | Discharge status: Home / Self Care | Payer: Medicare Other | Attending: Emergency Medicine | Admitting: Emergency Medicine

## 2015-03-29 ENCOUNTER — Emergency Department: Payer: Medicare Other

## 2015-03-29 ENCOUNTER — Encounter: Payer: Self-pay | Admitting: *Deleted

## 2015-03-29 DIAGNOSIS — I10 Essential (primary) hypertension: Secondary | ICD-10-CM | POA: Insufficient documentation

## 2015-03-29 DIAGNOSIS — Z9981 Dependence on supplemental oxygen: Secondary | ICD-10-CM | POA: Insufficient documentation

## 2015-03-29 DIAGNOSIS — J441 Chronic obstructive pulmonary disease with (acute) exacerbation: Secondary | ICD-10-CM | POA: Diagnosis not present

## 2015-03-29 DIAGNOSIS — R0602 Shortness of breath: Secondary | ICD-10-CM | POA: Diagnosis present

## 2015-03-29 HISTORY — DX: Unspecified glaucoma: H40.9

## 2015-03-29 HISTORY — DX: Chronic obstructive pulmonary disease, unspecified: J44.9

## 2015-03-29 HISTORY — DX: Sarcoidosis, unspecified: D86.9

## 2015-03-29 HISTORY — DX: Essential (primary) hypertension: I10

## 2015-03-29 LAB — TROPONIN I: Troponin I: <0.03 ng/mL (ref <0.031)

## 2015-03-29 LAB — COMPREHENSIVE METABOLIC PANEL
ALT: 20 U/L (ref 14–54)
AST: 21 U/L (ref 15–41)
Albumin: 4.4 g/dL (ref 3.5–5.0)
Alkaline Phosphatase: 46 U/L (ref 38–126)
Anion gap: 8 (ref 5–15)
BUN: 28 mg/dL — ABNORMAL HIGH (ref 6–20)
CO2: 36 mmol/L — ABNORMAL HIGH (ref 22–32)
Calcium: 9.8 mg/dL (ref 8.9–10.3)
Chloride: 98 mmol/L — ABNORMAL LOW (ref 101–111)
Creatinine, Ser: 0.88 mg/dL (ref 0.44–1.00)
GFR calc Af Amer: >60 mL/min (ref >60)
GFR calc non Af Amer: >60 mL/min (ref >60)
Glucose, Bld: 69 mg/dL (ref 65–99)
Potassium: 3.1 mmol/L — ABNORMAL LOW (ref 3.5–5.1)
Sodium: 142 mmol/L (ref 135–145)
Total Bilirubin: 0.7 mg/dL (ref 0.3–1.2)
Total Protein: 7.9 g/dL (ref 6.5–8.1)

## 2015-03-29 LAB — CBC
HCT: 37.6 % (ref 35.0–47.0)
Hemoglobin: 11.8 g/dL — ABNORMAL LOW (ref 12.0–16.0)
MCH: 26.5 pg (ref 26.0–34.0)
MCHC: 31.4 g/dL — ABNORMAL LOW (ref 32.0–36.0)
MCV: 84.2 fL (ref 80.0–100.0)
Platelets: 342 10*3/uL (ref 150–440)
RBC: 4.47 MIL/uL (ref 3.80–5.20)
RDW: 13.9 % (ref 11.5–14.5)
WBC: 8.4 10*3/uL (ref 3.6–11.0)

## 2015-03-29 MED ORDER — HYDROCODONE-HOMATROPINE 5-1.5 MG/5ML PO SYRP: 5.0000 mL | ORAL_SOLUTION | Freq: Four times a day (QID) | ORAL | Status: DC | PRN

## 2015-03-29 MED ORDER — IPRATROPIUM-ALBUTEROL 0.5-2.5 (3) MG/3ML IN SOLN
RESPIRATORY_TRACT | Status: AC
  Administered 2015-03-29: 3 mL via RESPIRATORY_TRACT
  Filled 2015-03-29: qty 3

## 2015-03-29 MED ORDER — IPRATROPIUM-ALBUTEROL 0.5-2.5 (3) MG/3ML IN SOLN
3.0000 mL | Freq: Once | RESPIRATORY_TRACT | Status: DC
  Filled 2015-03-29: qty 3

## 2015-03-29 MED ORDER — IPRATROPIUM-ALBUTEROL 0.5-2.5 (3) MG/3ML IN SOLN
3.0000 mL | Freq: Once | RESPIRATORY_TRACT | Status: DC
  Administered 2015-03-29: 3 mL via RESPIRATORY_TRACT
  Filled 2015-03-29: qty 3

## 2015-03-29 MED ORDER — IPRATROPIUM-ALBUTEROL 0.5-2.5 (3) MG/3ML IN SOLN
3.0000 mL | Freq: Once | RESPIRATORY_TRACT | Status: AC
  Administered 2015-03-29: 3 mL via RESPIRATORY_TRACT

## 2015-03-29 MED ORDER — ALBUTEROL SULFATE (2.5 MG/3ML) 0.083% IN NEBU
5.0000 mg | INHALATION_SOLUTION | Freq: Once | RESPIRATORY_TRACT | Status: DC
  Filled 2015-03-29: qty 6

## 2015-03-29 MED ORDER — GUAIFENESIN 100 MG/5ML PO SOLN: 5.0000 mL | ORAL | Status: DC | PRN

## 2015-03-29 MED ORDER — AZITHROMYCIN 250 MG PO TABS: ORAL_TABLET | ORAL | Status: DC

## 2015-03-29 MED ORDER — PREDNISONE 20 MG PO TABS: ORAL_TABLET | ORAL | Status: DC

## 2015-03-29 MED ORDER — PREDNISONE 20 MG PO TABS
40.0000 mg | ORAL_TABLET | Freq: Once | ORAL | Status: AC
  Administered 2015-03-29: 40 mg via ORAL
  Filled 2015-03-29: qty 2

## 2015-03-29 NOTE — ED Notes (Signed)
Pt informed to return if any life threatening symptoms occur.  

## 2015-03-29 NOTE — Discharge Instructions (Signed)

## 2015-03-29 NOTE — ED Provider Notes (Signed)
[facility] Emergency Department Provider Note  ____________________________________________  Time seen: 2:00 PM  I have reviewed the triage vital signs and the nursing notes.   HISTORY  Chief Complaint Cough and Shortness of Breath    HPI Autumn [name] is a 69 y.o. female who complains of nonproductive cough and shortness of breath for the past week. Worse with exertion. She takes 3 L nasal cannula at home when needed as well as Advair and Spiriva. She's been compliant with these but feels like she is having difficulty getting her symptoms under control. She has COPD. She completed a prednisone taper course today without significant relief of her symptoms. No purulent sputum or fevers or chills. No chest pain or dizziness.     Past Medical History  Diagnosis Date  . COPD (chronic obstructive pulmonary disease) ([place])   . Sarcoid ([place])   . Glaucoma   . Hypertension      There are no active problems to display for this patient.    History reviewed. No pertinent past surgical history.   Current Outpatient Rx  Name  Route  Sig  Dispense  Refill  . azithromycin (ZITHROMAX Z-PAK) 250 MG tablet      Take 2 tablets (500 mg) on  Day 1,  followed by 1 tablet (250 mg) once daily on Days 2 through 5.   6 each   0   . guaiFENesin (ROBITUSSIN) 100 MG/5ML SOLN   Oral   Take 5 mLs (100 mg total) by mouth every 4 (four) hours as needed for cough or to loosen phlegm.   120 mL   0   . HYDROcodone-homatropine (HYCODAN) 5-1.5 MG/5ML syrup   Oral   Take 5 mLs by mouth every 6 (six) hours as needed for cough.   120 mL   0   . predniSONE (DELTASONE) 20 MG tablet      40mg  daily x 3 days, then 20mg  daily x 5 days, then 10mg  daily x 4 days   13 tablet   0      Allergies Ivp dye   History reviewed. No pertinent family history.  Social History Social History  Substance Use Topics  . Smoking status: Never Smoker   . Smokeless tobacco: None  .  Alcohol Use: None    Review of Systems  Constitutional:   No fever or chills. No weight changes Eyes:   No blurry vision or double vision.  ENT:   No sore throat. Cardiovascular:   No chest pain. Respiratory:   Positive shortness of breath and nonproductive cough. Gastrointestinal:   Negative for abdominal pain, vomiting and diarrhea.  No BRBPR or melena. Genitourinary:   Negative for dysuria, urinary retention, bloody urine, or difficulty urinating. Musculoskeletal:   Negative for back pain. No joint swelling or pain. Skin:   Negative for rash. Neurological:   Negative for headaches, focal weakness or numbness. Psychiatric:  No anxiety or depression.   Endocrine:  No hot/cold intolerance, changes in energy, or sleep difficulty.  10-point ROS otherwise negative.  ____________________________________________   PHYSICAL EXAM:  VITAL SIGNS: ED Triage Vitals  Enc Vitals Group     BP 03/29/15 1107 157/66 mmHg     Pulse Rate 03/29/15 1107 84     Resp 03/29/15 1107 18     Temp 03/29/15 1107 98.9 F (37.2 C)     Temp Source 03/29/15 1107 Oral     SpO2 03/29/15 1107 96 %     Weight 03/29/15  1107 149 lb (67.586 kg)     Height 03/29/15 1107 5\' 8"  (1.727 m)     Head Cir --      Peak Flow --      Pain Score 03/29/15 1349 3     Pain Loc --      Pain Edu? --      Excl. in [place]? --      Constitutional:   Alert and oriented. Well appearing and in no distress. Eyes:   No scleral icterus. No conjunctival pallor. PERRL. EOMI ENT   Head:   Normocephalic and atraumatic.   Nose:   No congestion/rhinnorhea. No septal hematoma   Mouth/Throat:   MMM, no pharyngeal erythema. No peritonsillar mass. No uvula shift.   Neck:   No stridor. No SubQ emphysema. No meningismus. Hematological/Lymphatic/Immunilogical:   No cervical lymphadenopathy. Cardiovascular:   RRR. Normal and symmetric distal pulses are present in all extremities. No murmurs, rubs, or gallops. Respiratory:   Coarse  breath sounds diffusely with expiratory wheezing. Prolonged expiratory phase. This is accentuated with forced expiration Gastrointestinal:   Soft and nontender. No distention. There is no CVA tenderness.  No rebound, rigidity, or guarding. Genitourinary:   deferred Musculoskeletal:   Nontender with normal range of motion in all extremities. No joint effusions.  No lower extremity tenderness.  No edema. Neurologic:   Normal speech and language.  CN 2-10 normal. Motor grossly intact. No pronator drift.  Normal gait. No gross focal neurologic deficits are appreciated.  Skin:    Skin is warm, dry and intact. No rash noted.  No petechiae, purpura, or bullae. Psychiatric:   Mood and affect are normal. Speech and behavior are normal. Patient exhibits appropriate insight and judgment.  ____________________________________________    LABS (pertinent positives/negatives) (all labs ordered are listed, but only abnormal results are displayed) Labs Reviewed  CBC - Abnormal; Notable for the following:    Hemoglobin 11.8 (*)    MCHC 31.4 (*)    All other components within normal limits  COMPREHENSIVE METABOLIC PANEL - Abnormal; Notable for the following:    Potassium 3.1 (*)    Chloride 98 (*)    CO2 36 (*)    BUN 28 (*)    All other components within normal limits  TROPONIN I   ____________________________________________   EKG  Interpreted by me Normal sinus rhythm rate of 81, normal axis intervals QRS and ST segments and T waves.  ____________________________________________    RADIOLOGY  Chest x-ray unremarkable  ____________________________________________   PROCEDURES   ____________________________________________   INITIAL IMPRESSION / ASSESSMENT AND PLAN / ED COURSE  Pertinent labs & imaging results that were available during my care of the patient were reviewed by me and considered in my medical decision making (see chart for details).  Patient resents with COPD  exacerbation. Low suspicion for ACS PE TAD pneumothorax or carditis pneumonia or sepsis. We'll start her on a high-dose longer course of prednisone, continue bronchodilator therapy. Also start azithromycin and some additional medications for cough suppression and symptom relief. Follow-up with primary care within a week.     ____________________________________________   FINAL CLINICAL IMPRESSION(S) / ED DIAGNOSES  Final diagnoses:  COPD with acute exacerbation ([place])      Carrie Mew, MD 03/29/15 1450

## 2015-03-29 NOTE — ED Notes (Signed)
Pt states dry cough and SOB for 1 week, pt on 3L [place] PRN, states she tried predisone but not working

## 2015-05-08 ENCOUNTER — Other Ambulatory Visit: Payer: Self-pay | Admitting: Infectious Diseases

## 2015-05-08 DIAGNOSIS — Z1231 Encounter for screening mammogram for malignant neoplasm of breast: Secondary | ICD-10-CM

## 2015-05-16 ENCOUNTER — Ambulatory Visit: Payer: Medicare Other | Attending: Infectious Diseases

## 2015-05-30 ENCOUNTER — Ambulatory Visit
Admission: RE | Admit: 2015-05-30 | Discharge: 2015-05-30 | Disposition: A | Discharge status: Home / Self Care | Payer: Medicare Other | Source: Ambulatory Visit | Attending: Infectious Diseases | Admitting: Infectious Diseases

## 2015-05-30 DIAGNOSIS — Z1231 Encounter for screening mammogram for malignant neoplasm of breast: Secondary | ICD-10-CM

## 2015-05-30 HISTORY — DX: Malignant neoplasm of unspecified ovary: C56.9

## 2015-11-17 ENCOUNTER — Inpatient Hospital Stay
Admission: EM | Admit: 2015-11-17 | Discharge: 2015-11-21 | DRG: 190 | Disposition: A | Discharge status: Home / Self Care | Payer: Medicare Other | Attending: Internal Medicine | Admitting: Internal Medicine

## 2015-11-17 ENCOUNTER — Encounter: Payer: Self-pay | Admitting: Emergency Medicine

## 2015-11-17 ENCOUNTER — Emergency Department: Payer: Medicare Other

## 2015-11-17 DIAGNOSIS — Z9981 Dependence on supplemental oxygen: Secondary | ICD-10-CM | POA: Diagnosis not present

## 2015-11-17 DIAGNOSIS — T380X5A Adverse effect of glucocorticoids and synthetic analogues, initial encounter: Secondary | ICD-10-CM | POA: Diagnosis present

## 2015-11-17 DIAGNOSIS — J441 Chronic obstructive pulmonary disease with (acute) exacerbation: Secondary | ICD-10-CM

## 2015-11-17 DIAGNOSIS — J189 Pneumonia, unspecified organism: Secondary | ICD-10-CM | POA: Diagnosis present

## 2015-11-17 DIAGNOSIS — D72829 Elevated white blood cell count, unspecified: Secondary | ICD-10-CM | POA: Diagnosis present

## 2015-11-17 DIAGNOSIS — H409 Unspecified glaucoma: Secondary | ICD-10-CM | POA: Diagnosis present

## 2015-11-17 DIAGNOSIS — G8929 Other chronic pain: Secondary | ICD-10-CM | POA: Diagnosis present

## 2015-11-17 DIAGNOSIS — G629 Polyneuropathy, unspecified: Secondary | ICD-10-CM | POA: Diagnosis present

## 2015-11-17 DIAGNOSIS — Z803 Family history of malignant neoplasm of breast: Secondary | ICD-10-CM | POA: Diagnosis not present

## 2015-11-17 DIAGNOSIS — J440 Chronic obstructive pulmonary disease with acute lower respiratory infection: Secondary | ICD-10-CM | POA: Diagnosis present

## 2015-11-17 DIAGNOSIS — D869 Sarcoidosis, unspecified: Secondary | ICD-10-CM

## 2015-11-17 DIAGNOSIS — A419 Sepsis, unspecified organism: Secondary | ICD-10-CM

## 2015-11-17 DIAGNOSIS — Z91041 Radiographic dye allergy status: Secondary | ICD-10-CM | POA: Diagnosis not present

## 2015-11-17 DIAGNOSIS — Z8543 Personal history of malignant neoplasm of ovary: Secondary | ICD-10-CM | POA: Diagnosis not present

## 2015-11-17 DIAGNOSIS — R0602 Shortness of breath: Secondary | ICD-10-CM

## 2015-11-17 DIAGNOSIS — I10 Essential (primary) hypertension: Secondary | ICD-10-CM | POA: Diagnosis present

## 2015-11-17 DIAGNOSIS — R748 Abnormal levels of other serum enzymes: Secondary | ICD-10-CM | POA: Diagnosis present

## 2015-11-17 LAB — BASIC METABOLIC PANEL
Anion gap: 10 (ref 5–15)
BUN: 24 mg/dL — ABNORMAL HIGH (ref 6–20)
CO2: 34 mmol/L — ABNORMAL HIGH (ref 22–32)
Calcium: 10.3 mg/dL (ref 8.9–10.3)
Chloride: 94 mmol/L — ABNORMAL LOW (ref 101–111)
Creatinine, Ser: 0.82 mg/dL (ref 0.44–1.00)
GFR calc Af Amer: >60 mL/min (ref >60)
GFR calc non Af Amer: >60 mL/min (ref >60)
Glucose, Bld: 113 mg/dL — ABNORMAL HIGH (ref 65–99)
Potassium: 3.7 mmol/L (ref 3.5–5.1)
Sodium: 138 mmol/L (ref 135–145)

## 2015-11-17 LAB — CBC
HCT: 34.7 % — ABNORMAL LOW (ref 35.0–47.0)
Hemoglobin: 10.9 g/dL — ABNORMAL LOW (ref 12.0–16.0)
MCH: 26.3 pg (ref 26.0–34.0)
MCHC: 31.5 g/dL — ABNORMAL LOW (ref 32.0–36.0)
MCV: 83.6 fL (ref 80.0–100.0)
Platelets: 322 10*3/uL (ref 150–440)
RBC: 4.15 MIL/uL (ref 3.80–5.20)
RDW: 14.4 % (ref 11.5–14.5)
WBC: 22.9 10*3/uL — ABNORMAL HIGH (ref 3.6–11.0)

## 2015-11-17 LAB — TROPONIN I: Troponin I: 0.04 ng/mL (ref <0.03)

## 2015-11-17 MED ORDER — HYDROCODONE-HOMATROPINE 5-1.5 MG/5ML PO SYRP
5.0000 mL | ORAL_SOLUTION | Freq: Four times a day (QID) | ORAL | Status: DC | PRN
Start: 2015-11-17 — End: 2015-11-21

## 2015-11-17 MED ORDER — IPRATROPIUM-ALBUTEROL 0.5-2.5 (3) MG/3ML IN SOLN
3.0000 mL | RESPIRATORY_TRACT | Status: DC
  Administered 2015-11-17 – 2015-11-18 (×4): 3 mL via RESPIRATORY_TRACT
  Filled 2015-11-17 (×4): qty 3

## 2015-11-17 MED ORDER — HEPARIN SODIUM (PORCINE) 5000 UNIT/ML IJ SOLN
5000.0000 [IU] | Freq: Three times a day (TID) | INTRAMUSCULAR | Status: DC
  Administered 2015-11-17 – 2015-11-18 (×2): 5000 [IU] via SUBCUTANEOUS
  Filled 2015-11-17 (×2): qty 1

## 2015-11-17 MED ORDER — TRAZODONE HCL 50 MG PO TABS: 25.0000 mg | ORAL_TABLET | Freq: Every evening | ORAL | Status: DC | PRN

## 2015-11-17 MED ORDER — IPRATROPIUM-ALBUTEROL 0.5-2.5 (3) MG/3ML IN SOLN
3.0000 mL | Freq: Once | RESPIRATORY_TRACT | Status: AC
  Administered 2015-11-17: 3 mL via RESPIRATORY_TRACT
  Filled 2015-11-17: qty 3

## 2015-11-17 MED ORDER — SODIUM CHLORIDE 0.9 % IV SOLN
INTRAVENOUS | Status: DC
  Administered 2015-11-17: 22:00:00 via INTRAVENOUS

## 2015-11-17 MED ORDER — ONDANSETRON HCL 4 MG PO TABS: 4.0000 mg | ORAL_TABLET | Freq: Four times a day (QID) | ORAL | Status: DC | PRN

## 2015-11-17 MED ORDER — HYDROCODONE-ACETAMINOPHEN 5-325 MG PO TABS: 1.0000 | ORAL_TABLET | ORAL | Status: DC | PRN

## 2015-11-17 MED ORDER — METHYLPREDNISOLONE SODIUM SUCC 125 MG IJ SOLR
60.0000 mg | INTRAMUSCULAR | Status: DC
  Administered 2015-11-18 – 2015-11-20 (×3): 60 mg via INTRAVENOUS
  Filled 2015-11-17 (×3): qty 2

## 2015-11-17 MED ORDER — GUAIFENESIN 100 MG/5ML PO SOLN
5.0000 mL | ORAL | Status: DC | PRN
  Administered 2015-11-18 – 2015-11-19 (×3): 100 mg via ORAL
  Filled 2015-11-17 (×3): qty 10

## 2015-11-17 MED ORDER — DOCUSATE SODIUM 100 MG PO CAPS
100.0000 mg | ORAL_CAPSULE | Freq: Two times a day (BID) | ORAL | Status: DC
  Administered 2015-11-17 – 2015-11-21 (×8): 100 mg via ORAL
  Filled 2015-11-17 (×8): qty 1

## 2015-11-17 MED ORDER — AZITHROMYCIN 500 MG PO TABS
500.0000 mg | ORAL_TABLET | Freq: Once | ORAL | Status: AC
  Administered 2015-11-17: 500 mg via ORAL
  Filled 2015-11-17: qty 1

## 2015-11-17 MED ORDER — DEXTROSE 5 % IV SOLN
500.0000 mg | INTRAVENOUS | Status: DC
  Administered 2015-11-18 – 2015-11-20 (×3): 500 mg via INTRAVENOUS
  Filled 2015-11-17 (×4): qty 500

## 2015-11-17 MED ORDER — DEXTROSE 5 % IV SOLN
1.0000 g | INTRAVENOUS | Status: DC
  Administered 2015-11-18 – 2015-11-20 (×3): 1 g via INTRAVENOUS
  Filled 2015-11-17 (×4): qty 10

## 2015-11-17 MED ORDER — ACETAMINOPHEN 650 MG RE SUPP: 650.0000 mg | Freq: Four times a day (QID) | RECTAL | Status: DC | PRN

## 2015-11-17 MED ORDER — DEXTROSE 5 % IV SOLN
2.0000 g | Freq: Once | INTRAVENOUS | Status: AC
  Administered 2015-11-17: 2 g via INTRAVENOUS
  Filled 2015-11-17: qty 2

## 2015-11-17 MED ORDER — ONDANSETRON HCL 4 MG/2ML IJ SOLN: 4.0000 mg | Freq: Four times a day (QID) | INTRAMUSCULAR | Status: DC | PRN

## 2015-11-17 MED ORDER — BISACODYL 5 MG PO TBEC: 5.0000 mg | DELAYED_RELEASE_TABLET | Freq: Every day | ORAL | Status: DC | PRN

## 2015-11-17 MED ORDER — METHYLPREDNISOLONE SODIUM SUCC 125 MG IJ SOLR
125.0000 mg | INTRAMUSCULAR | Status: AC
  Administered 2015-11-17: 125 mg via INTRAVENOUS
  Filled 2015-11-17: qty 2

## 2015-11-17 MED ORDER — ACETAMINOPHEN 325 MG PO TABS
650.0000 mg | ORAL_TABLET | Freq: Four times a day (QID) | ORAL | Status: DC | PRN
  Administered 2015-11-18: 11:00:00 650 mg via ORAL
  Filled 2015-11-17: qty 2

## 2015-11-17 NOTE — ED Notes (Signed)
Pt up to bathroom in room for urination. Pt becomes extremely SOB, fatigued. Pt states she uses oxygen at home as needed, which is about once per month. Pt placed on 2L [place] by RN at time of triage. Pt back in bed and is on 2L , initial saturation when back in bed low 90's. Pt back up to 96% on 2L in approx 10 seconds.

## 2015-11-17 NOTE — H&P (Signed)
[name] NAME: Autumn [name]    MR#:  [id number]  DATE OF BIRTH:  [birth year]  DATE OF ADMISSION:  11/17/2015  PRIMARY CARE PHYSICIAN: Leonel Ramsay, MD   REQUESTING/REFERRING PHYSICIAN: Dr.quale  CHIEF COMPLAINT: Shortness of breath    Chief Complaint  Patient presents with  . Shortness of Breath    cough, wheezing    HISTORY OF PRESENT ILLNESS:  Mikayela [name]  is a 70 y.o. female with a known history of Coronary doses, COPD on oxygen 2 L all the time at home comes in with shortness of breath, wheezing for the past 1 week, patient was given prednisone dose tapper by primary doctor. Patient was on 40 mg of prednisone. Still having shortness of breath, productive cough. No fever. Patient has wheezing.  PAST MEDICAL HISTORY:   Past Medical History:  Diagnosis Date  . COPD (chronic obstructive pulmonary disease) ([place])   . Glaucoma   . Hypertension   . Ovarian cancer ([place])   . Sarcoid ([place])     PAST SURGICAL HISTOIRY:   Past Surgical History:  Procedure Laterality Date  . BREAST BIOPSY Right     SOCIAL HISTORY:   Social History  Substance Use Topics  . Smoking status: Never Smoker  . Smokeless tobacco: Never Used  . Alcohol use Not on file    FAMILY HISTORY:   Family History  Problem Relation Age of Onset  . Breast cancer Mother 72    DRUG ALLERGIES:   Allergies  Allergen Reactions  . Ivp Dye [Iodinated Diagnostic Agents] Hives and Itching    REVIEW OF SYSTEMS:  CONSTITUTIONAL: No fever, fatigue or weakness.  EYES: No blurred or double vision.  EARS, NOSE, AND THROAT: No tinnitus or ear pain.  RESPIRATORY:cough , shortness of breath, wheezing. CARDIOVASCULAR: No chest pain, orthopnea, edema.  GASTROINTESTINAL: No nausea, vomiting, diarrhea or abdominal pain.  GENITOURINARY: No dysuria, hematuria.  ENDOCRINE: No polyuria, nocturia,  HEMATOLOGY: No anemia, easy bruising or  bleeding SKIN: No rash or lesion. MUSCULOSKELETAL: No joint pain or arthritis.   NEUROLOGIC: No tingling, numbness, weakness.  PSYCHIATRY: No anxiety or depression.   MEDICATIONS AT HOME:   Prior to Admission medications   Medication Sig Start Date End Date Taking? Authorizing Provider  azithromycin (ZITHROMAX Z-PAK) 250 MG tablet Take 2 tablets (500 mg) on  Day 1,  followed by 1 tablet (250 mg) once daily on Days 2 through 5. 03/29/15   Carrie Mew, MD  guaiFENesin (ROBITUSSIN) 100 MG/5ML SOLN Take 5 mLs (100 mg total) by mouth every 4 (four) hours as needed for cough or to loosen phlegm. 03/29/15   Carrie Mew, MD  HYDROcodone-homatropine [facility]) 5-1.5 MG/5ML syrup Take 5 mLs by mouth every 6 (six) hours as needed for cough. 03/29/15   Carrie Mew, MD  predniSONE (DELTASONE) 20 MG tablet 40mg  daily x 3 days, then 20mg  daily x 5 days, then 10mg  daily x 4 days 03/30/15   Carrie Mew, MD      VITAL SIGNS:  Blood pressure (!) 176/77, pulse 93, temperature 98.9 F (37.2 C), temperature source Oral, resp. rate 16, height 5\' 7"  (1.702 m), weight 63.5 kg (140 lb), SpO2 98 %.  PHYSICAL EXAMINATION:  GENERAL:  70 y.o.-year-old patient lying in the bed with no acute distress.  EYES: Pupils equal, round, reactive to light and accommodation. No scleral icterus. Extraocular muscles intact.  HEENT: Head atraumatic, normocephalic. Oropharynx and nasopharynx clear.  NECK:  Supple, no jugular venous distention. No thyroid enlargement, no tenderness.  LUNGS: billateral expiratory wheeze in all lung fields. Not using accessory muscles of respiration. CARDIOVASCULAR: S1, S2 normal. No murmurs, rubs, or gallops.  ABDOMEN: Soft, nontender, nondistended. Bowel sounds present. No organomegaly or mass.  EXTREMITIES: No pedal edema, cyanosis, or clubbing.  NEUROLOGIC: Cranial nerves II through XII are intact. Muscle strength 5/5 in all extremities. Sensation intact. Gait not checked.   PSYCHIATRIC: The patient is alert and oriented x 3.  SKIN: No obvious rash, lesion, or ulcer.   LABORATORY PANEL:   CBC  Recent Labs Lab 11/17/15 1711  WBC 22.9*  HGB 10.9*  HCT 34.7*  PLT 322   ------------------------------------------------------------------------------------------------------------------  Chemistries   Recent Labs Lab 11/17/15 1711  NA 138  K 3.7  CL 94*  CO2 34*  GLUCOSE 113*  BUN 24*  CREATININE 0.82  CALCIUM 10.3   ------------------------------------------------------------------------------------------------------------------  Cardiac Enzymes  Recent Labs Lab 11/17/15 1711  TROPONINI 0.04*   ------------------------------------------------------------------------------------------------------------------  RADIOLOGY:  Dg Chest 2 View  Result Date: 11/17/2015 CLINICAL DATA:  Extreme shortness of breath, fatigue, history COPD, sarcoidosis, hypertension, ovarian cancer EXAM: CHEST  2 VIEW COMPARISON:  03/29/2015 FINDINGS: Normal heart size mediastinal contours. Rotated to the LEFT. Extensive LEFT upper lobe scarring and volume loss, less severe RIGHT apex. Underlying emphysematous changes. Question new infiltrate at lateral RIGHT lung base. Remaining lungs clear. No pleural effusion or pneumothorax. IMPRESSION: COPD changes with biapical scarring much more severe on LEFT compatible with history of sarcoidosis. Suspected new infiltrate at lateral RIGHT lung base. Electronically Signed   By: Lavonia Dana M.D.   On: 11/17/2015 18:03    EKG:   Orders placed or performed during the hospital encounter of 11/17/15  . EKG 12-Lead  . EKG 12-Lead  . EKG 12-Lead  . EKG 12-Lead  . ED EKG  . ED EKG   sinus rhythm 90 bpm ,no ST-T changes per.  IMPRESSION AND PLAN:   Right lobe pneumonia; and copd exacerbation;admit to medical service,,IV Medrol, Rocephin, Zithromax, nebulizers. And  See how she does. #2 history of sarcoidosis: #3 leukocytosis  secondary to steroid use.  4. slightly elevated troponins likely secondary to respiratory distress rather than any cardiac event. KG shows normal sinus rhythm 90 bpm no ST-T changes. Discussed  with patient, patient's husband.   All the records are reviewed and case discussed with ED provider. Management plans discussed with the patient, family and they are in agreement.  CODE STATUS: Full code  TOTAL TIME TAKING CARE OF THIS PATIENT: 55 minutes.    Epifanio Lesches M.D on 11/17/2015 at 7:31 PM  Between 7am to 6pm - Pager - [phone]  After 6pm go to [email or web address] - password [email or web address] [facility]  [place] [facility]  Office  [phone]  CC: Primary care physician; FITZGERALD, DAVID Mamie Nick, MD  Note: This dictation was prepared with Dragon dictation along with smaller phrase technology. Any transcriptional errors that result from this process are unintentional.

## 2015-11-17 NOTE — ED Notes (Signed)
Patient transported to X-ray 

## 2015-11-17 NOTE — ED Provider Notes (Signed)
[facility] Emergency Department Provider Note   ____________________________________________   First MD Initiated Contact with Patient 11/17/15 1655     (approximate)  I have reviewed the triage vital signs and the nursing notes.   HISTORY  Chief Complaint Shortness of Breath (cough, wheezing)    HPI Autumn [name] is a 70 y.o. female presents for evaluation of shortness of breath and a cough that has been worsening for the last week. She has COPD and sarcoid and has been taking prednisone for the last several days, was on small baseline dosing and then had it increased to 40 mg and now tapering the last couple days. She reports increasing shortness of breath, wheezing, as well as a productive cough for the last several days.  No chest pain. No fever but occasionally chilled. No nausea vomiting or abdominal pain. Patient reports that she is not getting better with prednisone and inhalers and nebulizers at home  She uses oxygen 2 L at nighttime, but often does not use oxygen during the day. He has been more short of breath recently Past Medical History:  Diagnosis Date  . COPD (chronic obstructive pulmonary disease) ([place])   . Glaucoma   . Hypertension   . Ovarian cancer ([place])   . 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   Past Surgical History:  Procedure Laterality Date  . BREAST BIOPSY Right     Prior to Admission medications   Medication Sig Start Date End Date Taking? Authorizing Provider  azithromycin (ZITHROMAX Z-PAK) 250 MG tablet Take 2 tablets (500 mg) on  Day 1,  followed by 1 tablet (250 mg) once daily on Days 2 through 5. 03/29/15   Carrie Mew, MD  guaiFENesin (ROBITUSSIN) 100 MG/5ML SOLN Take 5 mLs (100 mg total) by mouth every 4 (four) hours as needed for cough or to loosen phlegm. 03/29/15   Carrie Mew, MD  HYDROcodone-homatropine [facility]) 5-1.5 MG/5ML syrup Take 5 mLs by mouth every  6 (six) hours as needed for cough. 03/29/15   Carrie Mew, MD  predniSONE (DELTASONE) 20 MG tablet 40mg  daily x 3 days, then 20mg  daily x 5 days, then 10mg  daily x 4 days 03/30/15   Carrie Mew, MD    Allergies Ivp dye [iodinated diagnostic agents]  Family History  Problem Relation Age of Onset  . Breast cancer Mother 73    Social History Social History  Substance Use Topics  . Smoking status: Never Smoker  . Smokeless tobacco: Never Used  . Alcohol use Not on file    Review of Systems Constitutional: No fever. Increased fatigue Eyes: No visual changes. ENT: No sore throat. Cardiovascular: Denies chest pain. Respiratory: See history of present illness Gastrointestinal: No abdominal pain.  No nausea, no vomiting.  No diarrhea.  No constipation. Genitourinary: Negative for dysuria. Musculoskeletal: Negative for back pain. Skin: Negative for rash. Neurological: Negative for headaches, focal weakness or numbness.  10-point ROS otherwise negative.  ____________________________________________   PHYSICAL EXAM:  VITAL SIGNS: ED Triage Vitals  Enc Vitals Group     BP 11/17/15 1620 (!) 162/90     Pulse Rate 11/17/15 1620 91     Resp 11/17/15 1620 (!) 22     Temp 11/17/15 1623 98.9 F (37.2 C)     Temp Source 11/17/15 1623 Oral     SpO2 11/17/15 1620 (!) 87 %     Weight 11/17/15 1620 140 lb (63.5 kg)  Height 11/17/15 1620 5\' 7"  (1.702 m)     Head Circumference --      Peak Flow --      Pain Score 11/17/15 1621 4     Pain Loc --      Pain Edu? --      Excl. in [place]? --     Constitutional: Alert and oriented. Well appearing and With mild dyspnea. Eyes: Conjunctivae are normal. PERRL. EOMI. Head: Atraumatic. Nose: No congestion/rhinnorhea. Mouth/Throat: Mucous membranes are moist.  Oropharynx non-erythematous. Neck: No stridor.   Cardiovascular: Normal rate, regular rhythm. Grossly normal heart sounds.  Good peripheral circulation. Respiratory: Mildly  tachypnea, speaks in phrases, does have mild end expiratory wheezing and dry crackles noted in multiple lobes. No extremity. Gastrointestinal: Soft and nontender. No distention.  Musculoskeletal: No lower extremity tenderness nor edema.  No joint effusions. Neurologic:  Normal speech and language. No gross focal neurologic deficits are appreciated. Skin:  Skin is warm, dry and intact. No rash noted. Psychiatric: Mood and affect are normal. Speech and behavior are normal.  ____________________________________________   LABS (all labs ordered are listed, but only abnormal results are displayed)  Labs Reviewed  CBC - Abnormal; Notable for the following:       Result Value   WBC 22.9 (*)    Hemoglobin 10.9 (*)    HCT 34.7 (*)    MCHC 31.5 (*)    All other components within normal limits  BASIC METABOLIC PANEL - Abnormal; Notable for the following:    Chloride 94 (*)    CO2 34 (*)    Glucose, Bld 113 (*)    BUN 24 (*)    All other components within normal limits  TROPONIN I - Abnormal; Notable for the following:    Troponin I 0.04 (*)    All other components within normal limits  CULTURE, BLOOD (ROUTINE X 2)  CULTURE, BLOOD (ROUTINE X 2)   ____________________________________________  EKG  Reviewed and interpreted by me at 1640 Heart rate 90 PR 190 QTc 440 Normal sinus rhythm, significant artifact likely due to respiratory variation, no evidence of clear ischemic abnormality is noted. An incomplete right bundle branch block is noted. ____________________________________________  RADIOLOGY  Dg Chest 2 View  Result Date: 11/17/2015 CLINICAL DATA:  Extreme shortness of breath, fatigue, history COPD, sarcoidosis, hypertension, ovarian cancer EXAM: CHEST  2 VIEW COMPARISON:  03/29/2015 FINDINGS: Normal heart size mediastinal contours. Rotated to the LEFT. Extensive LEFT upper lobe scarring and volume loss, less severe RIGHT apex. Underlying emphysematous changes. Question new  infiltrate at lateral RIGHT lung base. Remaining lungs clear. No pleural effusion or pneumothorax. IMPRESSION: COPD changes with biapical scarring much more severe on LEFT compatible with history of sarcoidosis. Suspected new infiltrate at lateral RIGHT lung base. Electronically Signed   By: Ulyses SouthwardMark  Boles M.D.   On: 11/17/2015 18:03    ____________________________________________   PROCEDURES  Procedure(s) performed: None  Procedures  Critical Care performed: No    ____________________________________________   INITIAL IMPRESSION / ASSESSMENT AND PLAN / ED COURSE  Pertinent labs & imaging results that were available during my care of the patient were reviewed by me and considered in my medical decision making (see chart for details).  Patient history increasing dyspnea. Negative cough. Nebulizers and prednisone at home without improvement.  Clinical Course  Patient appears to be failing outpatient treatment for COPD exacerbation. Chest x-ray today notable infiltrate, also significant leukocytosis in this clinical setting I suspect the patient has community-acquired pneumonia.  She denies any recent hospitalizations or antibiotic treatments.  Because of the patient's elevated white count, mild tachypnea on presentation, and significant leukocytosis/chest x-ray finding I will admit her for ongoing management of COPD exacerbation as well as probable mild sepsis due to pneumonia.  Presenting vital signs include a mild tachypnea, in the setting of this and leukocytosis and infiltrate on x-ray appears consistent with mild clinical sepsis. ____________________________________________   FINAL CLINICAL IMPRESSION(S) / ED DIAGNOSES  Final diagnoses:  Community acquired pneumonia  Sepsis, due to unspecified organism ([place])  COPD exacerbation ([place])  Sarcoidosis ([place])      NEW MEDICATIONS STARTED DURING THIS VISIT:  New Prescriptions   No medications on file     Note:  This  document was prepared using Dragon voice recognition software and may include unintentional dictation errors.     Delman Kitten, MD 11/17/15 [phone]

## 2015-11-17 NOTE — ED Notes (Signed)
Report to Sarah, RN

## 2015-11-17 NOTE — ED Notes (Signed)
Report given to floor RN. Pt taken to floor via stretcher. Vital signs stable prior to transport.  

## 2015-11-17 NOTE — ED Triage Notes (Signed)
Pt presents with shortness of breath for one week. Pt currently on oxygen in triage with sats in the 80's.

## 2015-11-18 LAB — GLUCOSE, CAPILLARY: Glucose-Capillary: 116 mg/dL — ABNORMAL HIGH (ref 65–99)

## 2015-11-18 LAB — CBC
HCT: 33.7 % — ABNORMAL LOW (ref 35.0–47.0)
Hemoglobin: 10.8 g/dL — ABNORMAL LOW (ref 12.0–16.0)
MCH: 26.9 pg (ref 26.0–34.0)
MCHC: 32.2 g/dL (ref 32.0–36.0)
MCV: 83.5 fL (ref 80.0–100.0)
Platelets: 293 10*3/uL (ref 150–440)
RBC: 4.04 MIL/uL (ref 3.80–5.20)
RDW: 14.5 % (ref 11.5–14.5)
WBC: 23.2 10*3/uL — ABNORMAL HIGH (ref 3.6–11.0)

## 2015-11-18 LAB — BASIC METABOLIC PANEL
Anion gap: 7 (ref 5–15)
BUN: 23 mg/dL — ABNORMAL HIGH (ref 6–20)
CO2: 35 mmol/L — ABNORMAL HIGH (ref 22–32)
Calcium: 9.7 mg/dL (ref 8.9–10.3)
Chloride: 97 mmol/L — ABNORMAL LOW (ref 101–111)
Creatinine, Ser: 0.7 mg/dL (ref 0.44–1.00)
GFR calc Af Amer: >60 mL/min (ref >60)
GFR calc non Af Amer: >60 mL/min (ref >60)
Glucose, Bld: 143 mg/dL — ABNORMAL HIGH (ref 65–99)
Potassium: 3.6 mmol/L (ref 3.5–5.1)
Sodium: 139 mmol/L (ref 135–145)

## 2015-11-18 MED ORDER — IBUPROFEN-DIPHENHYDRAMINE HCL 200-25 MG PO CAPS: 1.0000 | ORAL_CAPSULE | Freq: Every evening | ORAL | Status: DC | PRN

## 2015-11-18 MED ORDER — GABAPENTIN 300 MG PO CAPS
600.0000 mg | ORAL_CAPSULE | Freq: Three times a day (TID) | ORAL | Status: DC
  Administered 2015-11-18 – 2015-11-21 (×10): 600 mg via ORAL
  Filled 2015-11-18 (×10): qty 2

## 2015-11-18 MED ORDER — ENOXAPARIN SODIUM 40 MG/0.4ML ~~LOC~~ SOLN
40.0000 mg | SUBCUTANEOUS | Status: DC
  Administered 2015-11-18 – 2015-11-20 (×3): 40 mg via SUBCUTANEOUS
  Filled 2015-11-18 (×4): qty 0.4

## 2015-11-18 MED ORDER — DIPHENHYDRAMINE HCL 25 MG PO CAPS: 25.0000 mg | ORAL_CAPSULE | Freq: Every evening | ORAL | Status: DC | PRN

## 2015-11-18 MED ORDER — ATENOLOL 25 MG PO TABS
25.0000 mg | ORAL_TABLET | Freq: Two times a day (BID) | ORAL | Status: DC
  Administered 2015-11-18 – 2015-11-21 (×7): 25 mg via ORAL
  Filled 2015-11-18 (×7): qty 1

## 2015-11-18 MED ORDER — CALCIUM CARBONATE-VITAMIN D 500-200 MG-UNIT PO TABS
1.0000 | ORAL_TABLET | Freq: Two times a day (BID) | ORAL | Status: DC
  Administered 2015-11-18 – 2015-11-21 (×7): 1 via ORAL
  Filled 2015-11-18 (×7): qty 1

## 2015-11-18 MED ORDER — MOMETASONE FURO-FORMOTEROL FUM 200-5 MCG/ACT IN AERO
2.0000 | INHALATION_SPRAY | Freq: Two times a day (BID) | RESPIRATORY_TRACT | Status: DC
  Administered 2015-11-18 – 2015-11-21 (×7): 2 via RESPIRATORY_TRACT
  Filled 2015-11-18: qty 8.8

## 2015-11-18 MED ORDER — CALCIUM CARB-CHOLECALCIFEROL 600-800 MG-UNIT PO TABS: 1.0000 | ORAL_TABLET | Freq: Two times a day (BID) | ORAL | Status: DC

## 2015-11-18 MED ORDER — OMEGA-3-ACID ETHYL ESTERS 1 G PO CAPS
1.0000 g | ORAL_CAPSULE | Freq: Every day | ORAL | Status: DC
  Administered 2015-11-18 – 2015-11-21 (×4): 1 g via ORAL
  Filled 2015-11-18 (×4): qty 1

## 2015-11-18 MED ORDER — LATANOPROST 0.005 % OP SOLN
1.0000 [drp] | Freq: Every day | OPHTHALMIC | Status: DC
  Administered 2015-11-18 – 2015-11-20 (×3): 1 [drp] via OPHTHALMIC
  Filled 2015-11-18: qty 2.5

## 2015-11-18 MED ORDER — TIOTROPIUM BROMIDE MONOHYDRATE 18 MCG IN CAPS
1.0000 | ORAL_CAPSULE | Freq: Every day | RESPIRATORY_TRACT | Status: DC
  Administered 2015-11-18 – 2015-11-21 (×4): 18 ug via RESPIRATORY_TRACT
  Filled 2015-11-18: qty 5

## 2015-11-18 MED ORDER — IPRATROPIUM-ALBUTEROL 0.5-2.5 (3) MG/3ML IN SOLN
3.0000 mL | Freq: Three times a day (TID) | RESPIRATORY_TRACT | Status: DC
  Administered 2015-11-18 – 2015-11-21 (×8): 3 mL via RESPIRATORY_TRACT
  Filled 2015-11-18 (×8): qty 3

## 2015-11-18 MED ORDER — ATORVASTATIN CALCIUM 20 MG PO TABS
10.0000 mg | ORAL_TABLET | Freq: Every day | ORAL | Status: DC
  Administered 2015-11-18 – 2015-11-20 (×3): 10 mg via ORAL
  Filled 2015-11-18 (×3): qty 1

## 2015-11-18 MED ORDER — IBUPROFEN 400 MG PO TABS: 200.0000 mg | ORAL_TABLET | Freq: Every evening | ORAL | Status: DC | PRN

## 2015-11-18 MED ORDER — TRAMADOL HCL 50 MG PO TABS
50.0000 mg | ORAL_TABLET | Freq: Two times a day (BID) | ORAL | Status: DC
  Administered 2015-11-18 – 2015-11-21 (×7): 50 mg via ORAL
  Filled 2015-11-18 (×7): qty 1

## 2015-11-18 NOTE — Plan of Care (Signed)
Problem: Education: Goal: Knowledge of [facility] Education information/materials will improve Outcome: Progressing Pt instructed re spiriva inhaler. Care of capsule post  breakage

## 2015-11-18 NOTE — Care Management Important Message (Signed)
Important Message  Patient Details  Name: Autumn [name] MRN: [id number] Date of Birth: 09-30-1945   Medicare Important Message Given:  Yes    Shelbie Ammons, RN 11/18/2015, 8:28 AM

## 2015-11-18 NOTE — Care Management (Signed)
Admitted to [facility] with the diagnosis of pneumonia. Lives with husband, Chrissie Noa [phone]). Home oxygen x 3 years through [employer]. Uses oxygen as needed 2 liters per nasal cannula. Last seen Dr. Ola Spurr 2 months ago. [facility] Nurse does yearly physical in the home. No home Health. No skilled facility. No falls. Good appetite. Takes care of all basic and instrumental activities of daily living herself, drives. Prescriptions are filled at [facility]. Husband will transport. Shelbie Ammons RN MSN CCM Care Management [phone]

## 2015-11-18 NOTE — Progress Notes (Signed)
[place] at [place] NAME: Yarelin Merenda    MR#:  [id number]  DATE OF BIRTH:  [birth year]  SUBJECTIVE: Patient feels better today. Could not sleep last night.   CHIEF COMPLAINT:   Chief Complaint  Patient presents with  . Shortness of Breath    cough, wheezing    REVIEW OF SYSTEMS:   ROS CONSTITUTIONAL: No fever, fatigue or weakness.  EYES: No blurred or double vision.  EARS, NOSE, AND THROAT: No tinnitus or ear pain.  RESPIRATORY: No cough, shortness of breath, wheezing or hemoptysis.  CARDIOVASCULAR: No chest pain, orthopnea, edema.  GASTROINTESTINAL: No nausea, vomiting, diarrhea or abdominal pain.  GENITOURINARY: No dysuria, hematuria.  ENDOCRINE: No polyuria, nocturia,  HEMATOLOGY: No anemia, easy bruising or bleeding SKIN: No rash or lesion. MUSCULOSKELETAL: No joint pain or arthritis.   NEUROLOGIC: No tingling, numbness, weakness.  PSYCHIATRY: No anxiety or depression.   DRUG ALLERGIES:   Allergies  Allergen Reactions  . Ivp Dye [Iodinated Diagnostic Agents] Hives and Itching    VITALS:  Blood pressure (!) 176/76, pulse 91, temperature 98.4 F (36.9 C), temperature source Oral, resp. rate 18, height 5\' 7"  (1.702 m), weight 62.8 kg (138 lb 6.4 oz), SpO2 100 %.  PHYSICAL EXAMINATION:  GENERAL:  70 y.o.-year-old patient lying in the bed with no acute distress.  EYES: Pupils equal, round, reactive to light and accommodation. No scleral icterus. Extraocular muscles intact.  HEENT: Head atraumatic, normocephalic. Oropharynx and nasopharynx clear.  NECK:  Supple, no jugular venous distention. No thyroid enlargement, no tenderness.  LUNGS:Mild expiratory wheeze in all lung fields. mCARDIOVASCULAR: S1, S2 normal. No murmurs, rubs, or gallops.  ABDOMEN: Soft, nontender, nondistended. Bowel sounds present. No organomegaly or mass.  EXTREMITIES: No pedal edema, cyanosis, or clubbing.  NEUROLOGIC: Cranial nerves II through  XII are intact. Muscle strength 5/5 in all extremities. Sensation intact. Gait not checked.  PSYCHIATRIC: The patient is alert and oriented x 3.  SKIN: No obvious rash, lesion, or ulcer.    LABORATORY PANEL:   CBC  Recent Labs Lab 11/18/15 0503  WBC 23.2*  HGB 10.8*  HCT 33.7*  PLT 293   ------------------------------------------------------------------------------------------------------------------  Chemistries   Recent Labs Lab 11/18/15 0503  NA 139  K 3.6  CL 97*  CO2 35*  GLUCOSE 143*  BUN 23*  CREATININE 0.70  CALCIUM 9.7   ------------------------------------------------------------------------------------------------------------------  Cardiac Enzymes  Recent Labs Lab 11/17/15 1711  TROPONINI 0.04*   ------------------------------------------------------------------------------------------------------------------  RADIOLOGY:  Dg Chest 2 View  Result Date: 11/17/2015 CLINICAL DATA:  Extreme shortness of breath, fatigue, history COPD, sarcoidosis, hypertension, ovarian cancer EXAM: CHEST  2 VIEW COMPARISON:  03/29/2015 FINDINGS: Normal heart size mediastinal contours. Rotated to the LEFT. Extensive LEFT upper lobe scarring and volume loss, less severe RIGHT apex. Underlying emphysematous changes. Question new infiltrate at lateral RIGHT lung base. Remaining lungs clear. No pleural effusion or pneumothorax. IMPRESSION: COPD changes with biapical scarring much more severe on LEFT compatible with history of sarcoidosis. Suspected new infiltrate at lateral RIGHT lung base. Electronically Signed   By: Lavonia Dana M.D.   On: 11/17/2015 18:03    EKG:   Orders placed or performed during the hospital encounter of 11/17/15  . EKG 12-Lead  . EKG 12-Lead  . EKG 12-Lead  . EKG 12-Lead  . ED EKG  . ED EKG    ASSESSMENT AND PLAN:   Community-acquired pneumonia and COPD exacerbation: Patient clinical improving. Continue IV antibiotics, IV  steroids, nebulizers.  Likely discharge home tomorrow with tapering of prednisone, by mouth antibiotics. #2 history of neuropathy: Patient takes Neurontin: Restarted the home medications. #3 history of COPD, sarcoidosis. Started back on home medication with Spiriva. #4 chronic pain: Patient on tramadol. Restarted them.     All the records are reviewed and case discussed with Care Management/Social Workerr. Management plans discussed with the patient, family and they are in agreement.  CODE STATUS: full  TOTAL TIME TAKING CARE OF THIS PATIENT: 35 minutes.   POSSIBLE D/C IN 1-2  DAYS, DEPENDING ON CLINICAL CONDITION.   Epifanio Lesches M.D on 11/18/2015 at 8:00 AM  Between 7am to 6pm - Pager - [phone]  After 6pm go to [email or web address] - password [email or web address] [facility]  [place] [facility]  Office  [phone]  CC: Primary care physician; FITZGERALD, DAVID Mamie Nick, MD   Note: This dictation was prepared with Dragon dictation along with smaller phrase technology. Any transcriptional errors that result from this process are unintentional.

## 2015-11-19 ENCOUNTER — Inpatient Hospital Stay: Payer: Medicare Other

## 2015-11-19 LAB — CBC
HCT: 32.2 % — ABNORMAL LOW (ref 35.0–47.0)
Hemoglobin: 10.6 g/dL — ABNORMAL LOW (ref 12.0–16.0)
MCH: 27.5 pg (ref 26.0–34.0)
MCHC: 33 g/dL (ref 32.0–36.0)
MCV: 83.5 fL (ref 80.0–100.0)
Platelets: 283 10*3/uL (ref 150–440)
RBC: 3.86 MIL/uL (ref 3.80–5.20)
RDW: 14.6 % — ABNORMAL HIGH (ref 11.5–14.5)
WBC: 18.5 10*3/uL — ABNORMAL HIGH (ref 3.6–11.0)

## 2015-11-19 LAB — GLUCOSE, CAPILLARY
Glucose-Capillary: 120 mg/dL — ABNORMAL HIGH (ref 65–99)
Glucose-Capillary: 112 mg/dL — ABNORMAL HIGH (ref 65–99)
Glucose-Capillary: 109 mg/dL — ABNORMAL HIGH (ref 65–99)

## 2015-11-19 MED ORDER — HYDROCODONE-ACETAMINOPHEN 5-325 MG PO TABS: 1.0000 | ORAL_TABLET | ORAL | 0 refills | Status: DC | PRN

## 2015-11-19 MED ORDER — AZITHROMYCIN 500 MG PO TABS: 500.0000 mg | ORAL_TABLET | Freq: Every day | ORAL | 0 refills | Status: DC

## 2015-11-19 MED ORDER — HYDRALAZINE HCL 20 MG/ML IJ SOLN
10.0000 mg | Freq: Once | INTRAMUSCULAR | Status: AC
  Administered 2015-11-19: 10 mg via INTRAVENOUS
  Filled 2015-11-19: qty 1

## 2015-11-19 MED ORDER — AMOXICILLIN-POT CLAVULANATE 875-125 MG PO TABS: 1.0000 | ORAL_TABLET | Freq: Two times a day (BID) | ORAL | 0 refills | Status: DC

## 2015-11-19 MED ORDER — ONDANSETRON HCL 4 MG PO TABS: 4.0000 mg | ORAL_TABLET | Freq: Four times a day (QID) | ORAL | 0 refills | Status: DC | PRN

## 2015-11-19 MED ORDER — PREDNISONE 20 MG PO TABS: ORAL_TABLET | ORAL | 0 refills | Status: DC

## 2015-11-19 NOTE — Care Management (Signed)
CM noticed that discharge order has been discontinued.  Informed that patient had increase in respiratory sx that were concerning for heart failure.  CXR with acute findings when compared to previous chest xray results.

## 2015-11-19 NOTE — Progress Notes (Signed)
[place] at [place] NAME: Autumn [name]    MR#:  [id number]  DATE OF BIRTH:  Mar 10, 1946  SUBJECTIVE: discharge cancelled due to persitent wheezing,high BP,   CHIEF COMPLAINT:   Chief Complaint  Patient presents with  . Shortness of Breath    cough, wheezing    REVIEW OF SYSTEMS:   ROS CONSTITUTIONAL: No fever, fatigue or weakness.  EYES: No blurred or double vision.  EARS, NOSE, AND THROAT: No tinnitus or ear pain.  RESPIRATORY: No cough, shortness of breath, wheezingpresent  CARDIOVASCULAR: No chest pain, orthopnea, edema.  GASTROINTESTINAL: No nausea, vomiting, diarrhea or abdominal pain.  GENITOURINARY: No dysuria, hematuria.  ENDOCRINE: No polyuria, nocturia,  HEMATOLOGY: No anemia, easy bruising or bleeding SKIN: No rash or lesion. MUSCULOSKELETAL: No joint pain or arthritis.   NEUROLOGIC: No tingling, numbness, weakness.  PSYCHIATRY: No anxiety or depression.   DRUG ALLERGIES:   Allergies  Allergen Reactions  . Ivp Dye [Iodinated Diagnostic Agents] Hives and Itching    VITALS:  Blood pressure (!) 115/50, pulse 83, temperature 98.1 F (36.7 C), temperature source Oral, resp. rate 17, height 5\' 7"  (1.702 m), weight 62.1 kg (137 lb), SpO2 98 %.  PHYSICAL EXAMINATION:  GENERAL:  70 y.o.-year-old patient lying in the bed with no acute distress.  EYES: Pupils equal, round, reactive to light and accommodation. No scleral icterus. Extraocular muscles intact.  HEENT: Head atraumatic, normocephalic. Oropharynx and nasopharynx clear.  NECK:  Supple, no jugular venous distention. No thyroid enlargement, no tenderness.  LUNGS:Mild expiratory wheeze in all lung fields. CARDIOVASCULAR: S1, S2 normal. No murmurs, rubs, or gallops.  ABDOMEN: Soft, nontender, nondistended. Bowel sounds present. No organomegaly or mass.  EXTREMITIES: No pedal edema, cyanosis, or clubbing.  NEUROLOGIC: Cranial nerves II through XII are intact.  Muscle strength 5/5 in all extremities. Sensation intact. Gait not checked.  PSYCHIATRIC: The patient is alert and oriented x 3.  SKIN: No obvious rash, lesion, or ulcer.    LABORATORY PANEL:   CBC  Recent Labs Lab 11/19/15 0608  WBC 18.5*  HGB 10.6*  HCT 32.2*  PLT 283   ------------------------------------------------------------------------------------------------------------------  Chemistries   Recent Labs Lab 11/18/15 0503  NA 139  K 3.6  CL 97*  CO2 35*  GLUCOSE 143*  BUN 23*  CREATININE 0.70  CALCIUM 9.7   ------------------------------------------------------------------------------------------------------------------  Cardiac Enzymes  Recent Labs Lab 11/17/15 1711  TROPONINI 0.04*   ------------------------------------------------------------------------------------------------------------------  RADIOLOGY:  Dg Chest 2 View  Result Date: 11/19/2015 CLINICAL DATA:  Shortness of breath. EXAM: CHEST  2 VIEW COMPARISON:  Radiographs of November 17, 2015. FINDINGS: Stable cardiomegaly. Stable biapical scarring is noted, left greater than right, consistent with history of sarcoidosis. Moderate dextroscoliosis of thoracic spine is noted. No pneumothorax or pleural effusion is noted. Right basilar scarring is noted. Opacity seen laterally in right lung base on prior exam appears to be slightly improved. Hyperexpansion of the lungs is noted. Bony thorax is unremarkable. IMPRESSION: Right lateral basilar opacity noted on prior exam appears to be slightly improved, suggesting improving atelectasis or infiltrate. Stable other chronic findings are noted as described above. Electronically Signed   By: Marijo Conception, M.D.   On: 11/19/2015 12:30    EKG:   Orders placed or performed during the hospital encounter of 11/17/15  . EKG 12-Lead  . EKG 12-Lead  . EKG 12-Lead  . EKG 12-Lead  . ED EKG  . ED EKG    ASSESSMENT AND  PLAN:   Community-acquired pneumonia and  COPD exacerbation:  More wheezing on right side;chest xray shows resolving pneumonia, hold discharge today, Continue IV antibiotics, IV steroids, nebulizers.   #2 history of neuropathy: Patient takes Neurontin: Restarted the home medications. #3 history of COPD, sarcoidosis. Started back on home medication with Spiriva. #4 chronic pain: Patient on tramadol. Restarted them. 5.hyn;elevated bp this am .received one dose IV hydrallazine.bp better now.,    All the records are reviewed and case discussed with Care Management/Social Workerr. Management plans discussed with the patient, family and they are in agreement.  CODE STATUS: full  TOTAL TIME TAKING CARE OF THIS PATIENT: 35 minutes.   POSSIBLE D/C IN 1-2  DAYS, DEPENDING ON CLINICAL CONDITION.   Epifanio Lesches M.D on 11/19/2015 at 8:10 PM  Between 7am to 6pm - Pager - [phone]  After 6pm go to [email or web address] - password [email or web address] [facility]  [place] [facility]  Office  [phone]  CC: Primary care physician; FITZGERALD, DAVID Mamie Nick, MD   Note: This dictation was prepared with Dragon dictation along with smaller phrase technology. Any transcriptional errors that result from this process are unintentional.

## 2015-11-19 NOTE — Care Management (Signed)
Patient for discharge home today and no discharge needs identified by care team

## 2015-11-19 NOTE — Discharge Summary (Signed)
Autumn [name], is a 70 y.o. female  DOB 12-Jan-1946  MRN [id number].  Admission date:  11/17/2015  Admitting Physician  Epifanio Lesches, MD  Discharge Date:  11/19/2015   Primary MD  Leonel Ramsay, MD  Recommendations for primary care physician for things to follow:   Follow-up with the primary doctor in 1 week Follow-up with primary pulmonologist in 1-2 weeks.   Admission Diagnosis  Sarcoidosis ([place]) [D86.9] Community acquired pneumonia [J18.9] COPD exacerbation ([place]) [J44.1] Sepsis, due to unspecified organism [facility]) [A41.9]   Discharge Diagnosis  Sarcoidosis ([place]) [D86.9] Community acquired pneumonia [J18.9] COPD exacerbation ([place]) [J44.1] Sepsis, due to unspecified organism ([place]) [A41.9]    Active Problems:   Pneumonia      Past Medical History:  Diagnosis Date  . COPD (chronic obstructive pulmonary disease) ([place])   . Glaucoma   . Hypertension   . Ovarian cancer ([place])   . Sarcoid [facility] ([place]     Past Surgical History:  Procedure Laterality Date  . BREAST BIOPSY Right        History of present illness and  Hospital Course:     Kindly see H&P for history of present illness and admission details, please review complete Labs, Consult reports and Test reports for all details in brief  HPI  from the history and physical done on the day of admission 70 year old female patient with history of sarcoidosis, COPD on oxygen 2 L comes in because of shortness of breath, wheezing. Patient was given prednisone taper as an outpatient.   Hospital Course  #1. Lower lobe pneumonia community-acquired: Started on Rocephin, Zithromax, Solu-Medrol,  nebulizers. had  Severe  wheezing on admission but improved a lot. Discharging her home today with Augmentin, Zithromax. So given prescription for prednisone taper. #2  essential hypertension: Patient takes atenolol.  bp This morning is of elevated to systolic of [id number]. One dose of hydralazine 10 mg IV given. Repeat blood pressure is 160/70. Patient can continue atenolol.25 mg po bid. #3 patient has a history of sarcoidosis: Follows up with the pulmonology at [facility]. #4 history of chronic pain, neuropathy: Patient takes tramadol, Neurontin. Elevated white count on admission secondary to recent steroid use and also pneumonia: White count improved.  Discharge Condition: Stable   Follow UP      Discharge Instructions  and  Discharge Medications   Follow with primary doctor     Medication List    TAKE these medications   ADVAIR DISKUS 250-50 MCG/DOSE Aepb Generic drug:  Fluticasone-Salmeterol Inhale into the lungs.   ADVIL PM 200-25 MG Caps Generic drug:  Ibuprofen-Diphenhydramine HCl Take 1 tablet by mouth at bedtime as needed.   albuterol (2.5 MG/3ML) 0.083% nebulizer solution Commonly known as:  PROVENTIL Take 2.5 mg by nebulization every 4 (four) hours as needed.   amoxicillin-clavulanate 875-125 MG tablet Commonly known as:  AUGMENTIN Take 1 tablet by mouth 2 (two) times daily.   atenolol 25 MG tablet Commonly known as:  TENORMIN Take 1 tablet by mouth 2 (two) times daily.   atorvastatin 10 MG tablet Commonly known as:  LIPITOR Take 1 tablet by mouth daily at 6 PM.   azithromycin 500 MG tablet Commonly known as:  ZITHROMAX Take 1 tablet (500 mg total) by mouth daily.   Brinzolamide-Brimonidine 1-0.2 % Susp Apply 1 drop to eye 3 (three) times daily.   CALCIUM 600+D3 600-800 MG-UNIT Tabs Generic drug:  Calcium Carb-Cholecalciferol Take 1 tablet by mouth 2 (two) times daily with a meal.  FISH OIL PO Take 1 capsule by mouth every evening.   gabapentin 300 MG capsule Commonly known as:  NEURONTIN Take 600 mg by mouth 3 (three) times daily.   HYDROcodone-acetaminophen 5-325 MG tablet Commonly known as:  NORCO/VICODIN Take 1-2  tablets by mouth every 4 (four) hours as needed for moderate pain.   ibuprofen 200 MG tablet Commonly known as:  ADVIL,MOTRIN Take 200 mg by mouth every 6 (six) hours as needed.   latanoprost 0.005 % ophthalmic solution Commonly known as:  XALATAN Apply 1 drop to eye at bedtime.   lidocaine-prilocaine cream Commonly known as:  EMLA Apply 1 application topically 2 (two) times daily as needed.   ondansetron 4 MG tablet Commonly known as:  ZOFRAN Take 1 tablet (4 mg total) by mouth every 6 (six) hours as needed for nausea.   predniSONE 20 MG tablet Commonly known as:  DELTASONE 40mg  daily x 3 days, then 20mg  daily x 5 days, then 10mg  daily x 4 days   SPIRIVA HANDIHALER 18 MCG inhalation capsule Generic drug:  tiotropium Place 1 capsule into inhaler and inhale daily.   traMADol 50 MG tablet Commonly known as:  ULTRAM Take 1 tablet by mouth 2 (two) times daily.         Diet and Activity recommendation: See Discharge Instructions above   Consults obtained -none   Major procedures and Radiology Reports - PLEASE review detailed and final reports for all details, in brief -      Dg Chest 2 View  Result Date: 11/17/2015 CLINICAL DATA:  Extreme shortness of breath, fatigue, history COPD, sarcoidosis, hypertension, ovarian cancer EXAM: CHEST  2 VIEW COMPARISON:  03/29/2015 FINDINGS: Normal heart size mediastinal contours. Rotated to the LEFT. Extensive LEFT upper lobe scarring and volume loss, less severe RIGHT apex. Underlying emphysematous changes. Question new infiltrate at lateral RIGHT lung base. Remaining lungs clear. No pleural effusion or pneumothorax. IMPRESSION: COPD changes with biapical scarring much more severe on LEFT compatible with history of sarcoidosis. Suspected new infiltrate at lateral RIGHT lung base. Electronically Signed   By: Lavonia Dana M.D.   On: 11/17/2015 18:03    Micro Results     No results found for this or any previous visit (from the past  240 hour(s)).     Today   Subjective:   Autumn [name] today has no headache,no chest abdominal pain,no new weakness tingling or numbness, feels much better wants to go home today.   Objective:   Blood pressure (!) 164/74, pulse 87, temperature 98.4 F (36.9 C), temperature source Oral, resp. rate 17, height 5\' 7"  (1.702 m), weight 62.1 kg (137 lb), SpO2 100 %.   Intake/Output Summary (Last 24 hours) at 11/19/15 0910 Last data filed at 11/19/15 0153  Gross per 24 hour  Intake              360 ml  Output              150 ml  Net              210 ml    Exam Awake Alert, Oriented x 3, No new F.N deficits, Normal affect [place].AT,PERRAL Supple Neck,No JVD, No cervical lymphadenopathy appriciated.  Symmetrical Chest wall movement, Good air movement bilaterally, CTAB RRR,No Gallops,Rubs or new Murmurs, No Parasternal Heave +ve B.Sounds, Abd Soft, Non tender, No organomegaly appriciated, No rebound -guarding or rigidity. No Cyanosis, Clubbing or edema, No new Rash or bruise  Data Review   CBC  w Diff: Lab Results  Component Value Date   WBC 18.5 (H) 11/19/2015   HGB 10.6 (L) 11/19/2015   HGB 9.3 (L) 05/20/2014   HCT 32.2 (L) 11/19/2015   HCT 29.8 (L) 05/20/2014   PLT 283 11/19/2015   PLT 401 05/20/2014   LYMPHOPCT 1.0 05/20/2014   MONOPCT 3.3 05/20/2014   EOSPCT 0.0 05/20/2014   BASOPCT 0.4 05/20/2014    CMP: Lab Results  Component Value Date   NA 139 11/18/2015   NA 138 05/19/2014   K 3.6 11/18/2015   K 4.0 05/19/2014   CL 97 (L) 11/18/2015   CL 97 (L) 05/19/2014   CO2 35 (H) 11/18/2015   CO2 39 (H) 05/19/2014   BUN 23 (H) 11/18/2015   BUN 20 (H) 05/19/2014   CREATININE 0.70 11/18/2015   CREATININE 0.80 05/19/2014   PROT 7.9 03/29/2015   PROT 8.0 05/18/2014   ALBUMIN 4.4 03/29/2015   ALBUMIN 3.1 (L) 05/18/2014   BILITOT 0.7 03/29/2015   BILITOT 0.3 05/18/2014   ALKPHOS 46 03/29/2015   ALKPHOS 64 05/18/2014   AST 21 03/29/2015   AST 34 05/18/2014   ALT 20  03/29/2015   ALT 42 05/18/2014  .   Total Time in preparing paper work, data evaluation and todays exam - 64 minutes  Zackary Mckeone M.D on 11/19/2015 at 9:10 AM    Note: This dictation was prepared with Dragon dictation along with smaller phrase technology. Any transcriptional errors that result from this process are unintentional.

## 2015-11-20 LAB — GLUCOSE, CAPILLARY
Glucose-Capillary: 113 mg/dL — ABNORMAL HIGH (ref 65–99)
Glucose-Capillary: 119 mg/dL — ABNORMAL HIGH (ref 65–99)

## 2015-11-20 LAB — CBC
HCT: 34.5 % — ABNORMAL LOW (ref 35.0–47.0)
Hemoglobin: 11.6 g/dL — ABNORMAL LOW (ref 12.0–16.0)
MCH: 28 pg (ref 26.0–34.0)
MCHC: 33.6 g/dL (ref 32.0–36.0)
MCV: 83.4 fL (ref 80.0–100.0)
Platelets: 299 10*3/uL (ref 150–440)
RBC: 4.14 MIL/uL (ref 3.80–5.20)
RDW: 14.2 % (ref 11.5–14.5)
WBC: 15.7 10*3/uL — ABNORMAL HIGH (ref 3.6–11.0)

## 2015-11-20 NOTE — Care Management Important Message (Signed)
Important Message  Patient Details  Name: Autumn [name] MRN: [id number] Date of Birth: November 17, 1945   Medicare Important Message Given:  Yes    Katrina Stack, RN 11/20/2015, 9:29 AM

## 2015-11-20 NOTE — Progress Notes (Signed)
[place] at [place] NAME: Autumn [name]    MR#:  [id number]  DATE OF BIRTH:  Sep 19, 1945  SUBJECTIVE: Wheezing is better. Does have some cough. Blood pressure is better. Chest x-ray showed improvement of right-sided pneumonia.   CHIEF COMPLAINT:   Chief Complaint  Patient presents with  . Shortness of Breath    cough, wheezing    REVIEW OF SYSTEMS:   ROS CONSTITUTIONAL: No fever, fatigue or weakness.  EYES: No blurred or double vision.  EARS, NOSE, AND THROAT: No tinnitus or ear pain.  RESPIRATORY:Cough present,  No shortness of breath, wheezingpresent  CARDIOVASCULAR: No chest pain, orthopnea, edema.  GASTROINTESTINAL: No nausea, vomiting, diarrhea or abdominal pain.  GENITOURINARY: No dysuria, hematuria.  ENDOCRINE: No polyuria, nocturia,  HEMATOLOGY: No anemia, easy bruising or bleeding SKIN: No rash or lesion. MUSCULOSKELETAL: No joint pain or arthritis.   NEUROLOGIC: No tingling, numbness, weakness.  PSYCHIATRY: No anxiety or depression.   DRUG ALLERGIES:   Allergies  Allergen Reactions  . Ivp Dye [Iodinated Diagnostic Agents] Hives and Itching    VITALS:  Blood pressure (!) 161/67, pulse 78, temperature 97.6 F (36.4 C), temperature source Oral, resp. rate 14, height 5\' 7"  (1.702 m), weight 62.4 kg (137 lb 8 oz), SpO2 100 %.  PHYSICAL EXAMINATION:  GENERAL:  70 y.o.-year-old patient lying in the bed with no acute distress.  EYES: Pupils equal, round, reactive to light and accommodation. No scleral icterus. Extraocular muscles intact.  HEENT: Head atraumatic, normocephalic. Oropharynx and nasopharynx clear.  NECK:  Supple, no jugular venous distention. No thyroid enlargement, no tenderness.  LUNGS:Mild expiratory wheeze in all lung fields. CARDIOVASCULAR: S1, S2 normal. No murmurs, rubs, or gallops.  ABDOMEN: Soft, nontender, nondistended. Bowel sounds present. No organomegaly or mass.  EXTREMITIES: No pedal  edema, cyanosis, or clubbing.  NEUROLOGIC: Cranial nerves II through XII are intact. Muscle strength 5/5 in all extremities. Sensation intact. Gait not checked.  PSYCHIATRIC: The patient is alert and oriented x 3.  SKIN: No obvious rash, lesion, or ulcer.    LABORATORY PANEL:   CBC  Recent Labs Lab 11/19/15 0608  WBC 18.5*  HGB 10.6*  HCT 32.2*  PLT 283   ------------------------------------------------------------------------------------------------------------------  Chemistries   Recent Labs Lab 11/18/15 0503  NA 139  K 3.6  CL 97*  CO2 35*  GLUCOSE 143*  BUN 23*  CREATININE 0.70  CALCIUM 9.7   ------------------------------------------------------------------------------------------------------------------  Cardiac Enzymes  Recent Labs Lab 11/17/15 1711  TROPONINI 0.04*   ------------------------------------------------------------------------------------------------------------------  RADIOLOGY:  Dg Chest 2 View  Result Date: 11/19/2015 CLINICAL DATA:  Shortness of breath. EXAM: CHEST  2 VIEW COMPARISON:  Radiographs of November 17, 2015. FINDINGS: Stable cardiomegaly. Stable biapical scarring is noted, left greater than right, consistent with history of sarcoidosis. Moderate dextroscoliosis of thoracic spine is noted. No pneumothorax or pleural effusion is noted. Right basilar scarring is noted. Opacity seen laterally in right lung base on prior exam appears to be slightly improved. Hyperexpansion of the lungs is noted. Bony thorax is unremarkable. IMPRESSION: Right lateral basilar opacity noted on prior exam appears to be slightly improved, suggesting improving atelectasis or infiltrate. Stable other chronic findings are noted as described above. Electronically Signed   By: Marijo Conception, M.D.   On: 11/19/2015 12:30    EKG:   Orders placed or performed during the hospital encounter of 11/17/15  . EKG 12-Lead  . EKG 12-Lead  . EKG 12-Lead  . EKG  12-Lead   . ED EKG  . ED EKG    ASSESSMENT AND PLAN:   Community-acquired pneumonia and COPD exacerbation:   Xray shows improving right pneumonia.continue steroid. Continue  nebulizers, antibiotics, likely discharge home tomorrow. #2 history of neuropathy: Patient takes Neurontin: Restarted the home medications. #3 history of COPD, sarcoidosis. Started back on home medication with Spiriva.  #4 chronic pain: Patient on tramadol. Restarted them. 5.htn; stable today.  All the records are reviewed and case discussed with Care Management/Social Workerr. Management plans discussed with the patient, family and they are in agreement.  CODE STATUS: full  TOTAL TIME TAKING CARE OF THIS PATIENT:  20  minutes.   POSSIBLE D/C IN 1-2  DAYS, DEPENDING ON CLINICAL CONDITION.   Epifanio Lesches M.D on 11/20/2015 at 8:30 AM  Between 7am to 6pm - Pager - [phone]  After 6pm go to [email or web address] - password [email or web address] [facility]  [place] [facility]  Office  [phone]  CC: Primary care physician; FITZGERALD, DAVID Mamie Nick, MD   Note: This dictation was prepared with Dragon dictation along with smaller phrase technology. Any transcriptional errors that result from this process are unintentional.

## 2015-11-20 NOTE — Care Management (Signed)
Patient required dose of IV hydralazine 8/9 for bp of 207/84.   Discussed home health follow up but patient is adamant and firm  that she is not homebound.  She will not qualify for home health

## 2015-11-21 LAB — GLUCOSE, CAPILLARY: Glucose-Capillary: 154 mg/dL — ABNORMAL HIGH (ref 65–99)

## 2015-11-21 NOTE — Discharge Instructions (Signed)
Antibiotic Medicine °Antibiotic medicines are used to treat infections caused by bacteria. They work by injuring or killing the bacteria that is making you sick. °HOW IS AN ANTIBIOTIC CHOSEN? °An antibiotic is chosen based on many factors. To help your health care provider choose one for you, tell your health care provider if: °· You have any allergies. °· You are pregnant or plan to get pregnant. °· You are breastfeeding. °· You are taking any medicines. These include over-the-counter medicines, prescription medicines, and herbal remedies. °· You have a medical condition or problem you have not already discussed. °Your health care provider will also consider: °· How often the medicine has to be taken. °· Common side effects of the medicine. °· The cost of the medicine. °· The taste of the medicine. °If you have questions about why an antibiotic was chosen, make sure to ask. °FOR HOW LONG SHOULD I TAKE MY ANTIBIOTIC? °Continue to take your antibiotic for as long as told by your health care provider. Do not stop taking it when you feel better. If you stop taking it too soon: °· You may start to feel sick again. °· Your infection may become harder to treat. °· Complications may develop. °WHAT IF I MISS A DOSE? °Try not to miss any doses of medicine. If you miss a dose, take it as soon as possible. However, if it is almost time for the next dose: °· If you are taking 2 doses per day, take the missed dose and the next dose 5 to 6 hours apart. °· If you are taking 3 or more doses per day, take the missed dose and the next dose 2 to 4 hours apart, then go back to the normal schedule. °If you cannot make up a missed dose, take the next scheduled dose on time. Then take the missed dose after you have taken all the doses as recommended by your health care provider, as if you had one more dose left. °DO ANTIBIOTICS AFFECT BIRTH CONTROL? °Birth control pills may not work while you are on antibiotics. If you are taking birth  control pills, continue taking them as usual and use a second form of birth control, such as a condom, to avoid unwanted pregnancy. Continue using the second form of birth control until you are finished with your current 1 month cycle of birth control pills. °OTHER INFORMATION °· If there is any medicine left over, throw it away. °· Never take someone else's antibiotics. °· Never take leftover antibiotics. °SEEK MEDICAL CARE IF: °· You get worse. °· You do not feel better within a few days of starting the antibiotic medicine. °· You vomit. °· White patches appear in your mouth. °· You have new joint pain that begins after starting the antibiotic. °· You have new muscle aches that begin after starting the antibiotic. °· You had a fever before starting the antibiotic and it returns. °· You have any symptoms of an allergic reaction, such as an itchy rash. If this happens, stop taking the antibiotic. °SEEK IMMEDIATE MEDICAL CARE IF: °· Your urine turns dark or becomes blood-colored. °· Your skin turns yellow. °· You bruise or bleed easily. °· You have severe diarrhea and abdominal cramps. °· You have a severe headache. °· You have signs of a severe allergic reaction, such as: °¨ Trouble breathing. °¨ Wheezing. °¨ Swelling of the lips, tongue, or face. °¨ Fainting. °¨ Blisters on the skin or in the mouth. °If you have signs of a severe allergic   reaction, stop taking the antibiotic right away.   This information is not intended to replace advice given to you by your health care provider. Make sure you discuss any questions you have with your health care provider.   Document Released: 12/10/2003 Document Revised: 12/18/2014 Document Reviewed: 08/14/2014 Elsevier Interactive Patient Education 2016 [place] Prevention While you may not be able to control the fact that you have asthma, you can take actions to prevent asthma attacks. The best way to prevent asthma attacks is to maintain good  control of your asthma. You can achieve this by:  Taking your medicines as directed.  Avoiding things that can irritate your airways or make your asthma symptoms worse (asthma triggers).  Keeping track of how well your asthma is controlled and of any changes in your symptoms.  Responding quickly to worsening asthma symptoms (asthma attack).  Seeking emergency care when it is needed. WHAT ARE SOME WAYS TO PREVENT AN ASTHMA ATTACK? Have a Plan Work with your health care provider to create a written plan for managing and treating your asthma attacks (asthma action plan). This plan includes:  A list of your asthma triggers and how you can avoid them.  Information on when medicines should be taken and when their dosages should be changed.  The use of a device that measures how well your lungs are working (peak flow meter). Monitor Your Asthma Use your peak flow meter and record your results in a journal every day. A drop in your peak flow numbers on one or more days may indicate the start of an asthma attack. This can happen even before you start to feel symptoms. You can prevent an asthma attack from getting worse by following the steps in your asthma action plan. Avoid Asthma Triggers Work with your asthma health care provider to find out what your asthma triggers are. This can be done by:  Allergy testing.  Keeping a journal that notes when asthma attacks occur and the factors that may have contributed to them.  Determining if there are other medical conditions that are making your asthma worse. Once you have determined your asthma triggers, take steps to avoid them. This may include avoiding excessive or prolonged exposure to:  Dust. Have someone dust and vacuum your home for you once or twice a week. Using a high-efficiency particulate arrestance (HEPA) vacuum is best.  Smoke. This includes campfire smoke, forest fire smoke, and secondhand smoke from tobacco products.  Pet dander.  Avoid contact with animals that you know you are allergic to.  Allergens from trees, grasses or pollens. Avoid spending a lot of time outdoors when pollen counts are high, and on very windy days.  Very cold, dry, or humid air.  Mold.  Foods that contain high amounts of sulfites.  Strong odors.  Outdoor air pollutants, such as [employer].  Indoor air pollutants, such as aerosol sprays and fumes from household cleaners.  Household pests, including dust mites and cockroaches, and pest droppings.  Certain medicines, including NSAIDs. Always talk to your health care provider before stopping or starting any new medicines. Medicines Take over-the-counter and prescription medicines only as told by your health care provider. Many asthma attacks can be prevented by carefully following your medicine schedule. Taking your medicines correctly is especially important when you cannot avoid certain asthma triggers. Act Quickly If an asthma attack does happen, acting quickly can decrease how severe it is and how long it lasts. Take these steps:  Pay attention to your symptoms. If you are coughing, wheezing, or having difficulty breathing, do not wait to see if your symptoms go away on their own. Follow your asthma action plan.  If you have followed your asthma action plan and your symptoms are not improving, call your health care provider or seek immediate medical care at the nearest hospital. It is important to note how often you need to use your fast-acting rescue inhaler. If you are using your rescue inhaler more often, it may mean that your asthma is not under control. Adjusting your asthma treatment plan may help you to prevent future asthma attacks and help you to gain better control of your condition. HOW CAN I PREVENT AN ASTHMA ATTACK WHEN I EXERCISE? Follow advice from your health care provider about whether you should use your fast-acting inhaler before exercising. Many people with asthma  experience exercise-induced bronchoconstriction (EIB). This condition often worsens during vigorous exercise in cold, humid, or dry environments. Usually, people with EIB can stay very active by pre-treating with a fast-acting inhaler before exercising.   This information is not intended to replace advice given to you by your health care provider. Make sure you discuss any questions you have with your health care provider.   Document Released: 03/17/2009 Document Revised: 12/18/2014 Document Reviewed: 08/29/2014 Elsevier Interactive Patient Education 2016 [place].   Asthma, Acute Bronchospasm Acute bronchospasm caused by asthma is also referred to as an asthma attack. Bronchospasm means your air passages become narrowed. The narrowing is caused by inflammation and tightening of the muscles in the air tubes (bronchi) in your lungs. This can make it hard to breathe or cause you to wheeze and cough. CAUSES Possible triggers are:  Animal dander from the skin, hair, or feathers of animals.  Dust mites contained in house dust.  Cockroaches.  Pollen from trees or grass.  Mold.  Cigarette or tobacco smoke.  Air pollutants such as dust, household cleaners, hair sprays, aerosol sprays, paint fumes, strong chemicals, or strong odors.  Cold air or weather changes. Cold air may trigger inflammation. Winds increase molds and pollens in the air.  Strong emotions such as crying or laughing hard.  Stress.  Certain medicines such as aspirin or beta-blockers.  Sulfites in foods and drinks, such as dried fruits and wine.  Infections or inflammatory conditions, such as a flu, cold, or inflammation of the nasal membranes (rhinitis).  Gastroesophageal reflux disease (GERD). GERD is a condition where stomach acid backs up into your esophagus.  Exercise or strenuous activity. SIGNS AND SYMPTOMS   Wheezing.  Excessive coughing, particularly at night.  Chest tightness.  Shortness of  breath. DIAGNOSIS  Your health care provider will ask you about your medical history and perform a physical exam. A chest X-ray or blood testing may be performed to look for other causes of your symptoms or other conditions that may have triggered your asthma attack. TREATMENT  Treatment is aimed at reducing inflammation and opening up the airways in your lungs. Most asthma attacks are treated with inhaled medicines. These include quick relief or rescue medicines (such as bronchodilators) and controller medicines (such as inhaled corticosteroids). These medicines are sometimes given through an inhaler or a nebulizer. Systemic steroid medicine taken by mouth or given through an IV tube also can be used to reduce the inflammation when an attack is moderate or severe. Antibiotic medicines are only used if a bacterial infection is present.  HOME CARE INSTRUCTIONS   Rest.  Drink plenty  of liquids. This helps the mucus to remain thin and be easily coughed up. Only use caffeine in moderation and do not use alcohol until you have recovered from your illness.  Do not smoke. Avoid being exposed to secondhand smoke.  You play a critical role in keeping yourself in good health. Avoid exposure to things that cause you to wheeze or to have breathing problems.  Keep your medicines up-to-date and available. Carefully follow your health care provider's treatment plan.  Take your medicine exactly as prescribed.  When pollen or pollution is bad, keep windows closed and use an air conditioner or go to places with air conditioning.  Asthma requires careful medical care. See your health care provider for a follow-up as advised. If you are more than [redacted] weeks pregnant and you were prescribed any new medicines, let your obstetrician know about the visit and how you are doing. Follow up with your health care provider as directed.  After you have recovered from your asthma attack, make an appointment with your  outpatient doctor to talk about ways to reduce the likelihood of future attacks. If you do not have a doctor who manages your asthma, make an appointment with a primary care doctor to discuss your asthma. SEEK IMMEDIATE MEDICAL CARE IF:   You are getting worse.  You have trouble breathing. If severe, call your local emergency services (911 in the [place]  You develop chest pain or discomfort.  You are vomiting.  You are not able to keep fluids down.  You are coughing up yellow, green, brown, or bloody sputum.  You have a fever and your symptoms suddenly get worse.  You have trouble swallowing. MAKE SURE YOU:   Understand these instructions.  Will watch your condition.  Will get help right away if you are not doing well or get worse.   This information is not intended to replace advice given to you by your health care provider. Make sure you discuss any questions you have with your health care provider.   Document Released: 07/14/2006 Document Revised: 04/03/2013 Document Reviewed: 10/04/2012 Elsevier Interactive Patient Education [employer].

## 2015-11-21 NOTE — Progress Notes (Signed)
Pt for discharge home today. Alert. No resp distress. sats  Remain  Mid to high 90s on room. Pt  Says she uses  Home o2 as needed at home.  amb around room and br w/o difficulty/ cont with a few  Wheezes at times.  Sl d/cd. Discharge instructions discussed with pt.  presc given and home meds discussed also.  Diet/ activity and f/u discussed. Waiting for spouse to pick her up.

## 2015-11-21 NOTE — Progress Notes (Signed)
Home via w/c w/o c/o.

## 2015-11-21 NOTE — Discharge Summary (Signed)
Autumn [name], is a 70 y.o. female  DOB Oct 17, 1945  MRN [id number].  Admission date:  11/17/2015  Admitting Physician  Epifanio Lesches, MD  Discharge Date:  11/21/2015   Primary MD  Leonel Ramsay, MD  Recommendations for primary care physician for things to follow:   Follow-up with the primary doctor in 1 week Follow-up with primary pulmonologist in 1-2 weeks.   Admission Diagnosis  Sarcoidosis ([place]) [D86.9] Community acquired pneumonia [J18.9] COPD exacerbation ([place]) [J44.1] Sepsis, due to unspecified organism [facility]) [A41.9]   Discharge Diagnosis  Sarcoidosis ([place]) [D86.9] Community acquired pneumonia [J18.9] COPD exacerbation ([employer]) [J44.1] Sepsis, due to unspecified organism ([place]) [A41.9]    Active Problems:   Pneumonia      Past Medical History:  Diagnosis Date  . COPD (chronic obstructive pulmonary disease) ([place])   . Glaucoma   . Hypertension   . Ovarian cancer ([place])   . Sarcoid [facility])     Past Surgical History:  Procedure Laterality Date  . BREAST BIOPSY Right        History of present illness and  Hospital Course:     Kindly see H&P for history of present illness and admission details, please review complete Labs, Consult reports and Test reports for all details in brief  HPI  from the history and physical done on the day of admission 70 year old female patient with history of sarcoidosis, COPD on oxygen 2 L comes in because of shortness of breath, wheezing. Patient was given prednisone taper as an outpatient.   Hospital Course  #1. Lower lobe pneumonia community-acquired: Started on Rocephin, Zithromax, Solu-Medrol,  nebulizers. had  Severe  wheezing on admission but improved a lot. Discharging her home today with Augmentin, Zithromax. So given prescription for prednisone taper. #2  essential hypertension: Patient takes atenolol.  bp This morning is of elevated to systolic of [id number]. One dose of hydralazine 10 mg IV given. Repeat blood pressure is 160/70. Patient can continue atenolol.25 mg po bid. #3 patient has a history of sarcoidosis: Follows up with the pulmonology at [facility]. #4 history of chronic pain, neuropathy: Patient takes tramadol, Neurontin. Elevated white count on admission secondary to recent steroid use and also pneumonia: White count improved.  Stable for discharge today  Discharge Condition: Stable   Follow UP      Discharge Instructions  and  Discharge Medications   Follow with primary doctor     Medication List    TAKE these medications   ADVAIR DISKUS 250-50 MCG/DOSE Aepb Generic drug:  Fluticasone-Salmeterol Inhale into the lungs.   ADVIL PM 200-25 MG Caps Generic drug:  Ibuprofen-Diphenhydramine HCl Take 1 tablet by mouth at bedtime as needed.   albuterol (2.5 MG/3ML) 0.083% nebulizer solution Commonly known as:  PROVENTIL Take 2.5 mg by nebulization every 4 (four) hours as needed.   amoxicillin-clavulanate 875-125 MG tablet Commonly known as:  AUGMENTIN Take 1 tablet by mouth 2 (two) times daily.   atenolol 25 MG tablet Commonly known as:  TENORMIN Take 1 tablet by mouth 2 (two) times daily.   atorvastatin 10 MG tablet Commonly known as:  LIPITOR Take 1 tablet by mouth daily at 6 PM.   azithromycin 500 MG tablet Commonly known as:  ZITHROMAX Take 1 tablet (500 mg total) by mouth daily.   Brinzolamide-Brimonidine 1-0.2 % Susp Apply 1 drop to eye 3 (three) times daily.   CALCIUM 600+D3 600-800 MG-UNIT Tabs Generic drug:  Calcium Carb-Cholecalciferol Take 1 tablet by mouth 2 (two) times  daily with a meal.   FISH OIL PO Take 1 capsule by mouth every evening.   gabapentin 300 MG capsule Commonly known as:  NEURONTIN Take 600 mg by mouth 3 (three) times daily.   HYDROcodone-acetaminophen 5-325 MG tablet Commonly  known as:  NORCO/VICODIN Take 1-2 tablets by mouth every 4 (four) hours as needed for moderate pain.   ibuprofen 200 MG tablet Commonly known as:  ADVIL,MOTRIN Take 200 mg by mouth every 6 (six) hours as needed.   latanoprost 0.005 % ophthalmic solution Commonly known as:  XALATAN Apply 1 drop to eye at bedtime.   lidocaine-prilocaine cream Commonly known as:  EMLA Apply 1 application topically 2 (two) times daily as needed.   ondansetron 4 MG tablet Commonly known as:  ZOFRAN Take 1 tablet (4 mg total) by mouth every 6 (six) hours as needed for nausea.   predniSONE 20 MG tablet Commonly known as:  DELTASONE 40mg  daily x 3 days, then 20mg  daily x 5 days, then 10mg  daily x 4 days   SPIRIVA HANDIHALER 18 MCG inhalation capsule Generic drug:  tiotropium Place 1 capsule into inhaler and inhale daily.   traMADol 50 MG tablet Commonly known as:  ULTRAM Take 1 tablet by mouth 2 (two) times daily.         Diet and Activity recommendation: See Discharge Instructions above   Consults obtained -none   Major procedures and Radiology Reports - PLEASE review detailed and final reports for all details, in brief -      Dg Chest 2 View  Result Date: 11/19/2015 CLINICAL DATA:  Shortness of breath. EXAM: CHEST  2 VIEW COMPARISON:  Radiographs of November 17, 2015. FINDINGS: Stable cardiomegaly. Stable biapical scarring is noted, left greater than right, consistent with history of sarcoidosis. Moderate dextroscoliosis of thoracic spine is noted. No pneumothorax or pleural effusion is noted. Right basilar scarring is noted. Opacity seen laterally in right lung base on prior exam appears to be slightly improved. Hyperexpansion of the lungs is noted. Bony thorax is unremarkable. IMPRESSION: Right lateral basilar opacity noted on prior exam appears to be slightly improved, suggesting improving atelectasis or infiltrate. Stable other chronic findings are noted as described above. Electronically  Signed   By: Marijo Conception, M.D.   On: 11/19/2015 12:30   Dg Chest 2 View  Result Date: 11/17/2015 CLINICAL DATA:  Extreme shortness of breath, fatigue, history COPD, sarcoidosis, hypertension, ovarian cancer EXAM: CHEST  2 VIEW COMPARISON:  03/29/2015 FINDINGS: Normal heart size mediastinal contours. Rotated to the LEFT. Extensive LEFT upper lobe scarring and volume loss, less severe RIGHT apex. Underlying emphysematous changes. Question new infiltrate at lateral RIGHT lung base. Remaining lungs clear. No pleural effusion or pneumothorax. IMPRESSION: COPD changes with biapical scarring much more severe on LEFT compatible with history of sarcoidosis. Suspected new infiltrate at lateral RIGHT lung base. Electronically Signed   By: Lavonia Dana M.D.   On: 11/17/2015 18:03    Micro Results     Recent Results (from the past 240 hour(s))  Culture, blood (Routine X 2) w Reflex to ID Panel     Status: None (Preliminary result)   Collection Time: 11/17/15  6:46 PM  Result Value Ref Range Status   Specimen Description BLOOD RIGHT WRIST  Final   Special Requests BOTTLES DRAWN AEROBIC AND ANAEROBIC 5CC  Final   Culture NO GROWTH 3 DAYS  Final   Report Status PENDING  Incomplete  Culture, blood (Routine X 2) w Reflex to  ID Panel     Status: None (Preliminary result)   Collection Time: 11/17/15  7:14 PM  Result Value Ref Range Status   Specimen Description BLOOD LEFT HAND  Final   Special Requests BOTTLES DRAWN AEROBIC AND ANAEROBIC 5CC  Final   Culture NO GROWTH 3 DAYS  Final   Report Status PENDING  Incomplete       Today   Subjective:   Autumn [name] today has no headache,no chest abdominal pain,no new weakness tingling or numbness, feels much better wants to go home today.   Objective:   Blood pressure (!) 150/64, pulse 74, temperature 98.7 F (37.1 C), temperature source Oral, resp. rate 16, height 5\' 7"  (1.702 m), weight 63.1 kg (139 lb 3.2 oz), SpO2 98 %.   Intake/Output Summary  (Last 24 hours) at 11/21/15 0902 Last data filed at 11/20/15 2112  Gross per 24 hour  Intake              360 ml  Output              200 ml  Net              160 ml    Exam Awake Alert, Oriented x 3, No new F.N deficits, Normal affect [place].AT,PERRAL Supple Neck,No JVD, No cervical lymphadenopathy appriciated.  Symmetrical Chest wall movement, Good air movement bilaterally, CTAB RRR,No Gallops,Rubs or new Murmurs, No Parasternal Heave +ve B.Sounds, Abd Soft, Non tender, No organomegaly appriciated, No rebound -guarding or rigidity. No Cyanosis, Clubbing or edema, No new Rash or bruise  Data Review   CBC w Diff:  Lab Results  Component Value Date   WBC 15.7 (H) 11/20/2015   HGB 11.6 (L) 11/20/2015   HGB 9.3 (L) 05/20/2014   HCT 34.5 (L) 11/20/2015   HCT 29.8 (L) 05/20/2014   PLT 299 11/20/2015   PLT 401 05/20/2014   LYMPHOPCT 1.0 05/20/2014   MONOPCT 3.3 05/20/2014   EOSPCT 0.0 05/20/2014   BASOPCT 0.4 05/20/2014    CMP:  Lab Results  Component Value Date   NA 139 11/18/2015   NA 138 05/19/2014   K 3.6 11/18/2015   K 4.0 05/19/2014   CL 97 (L) 11/18/2015   CL 97 (L) 05/19/2014   CO2 35 (H) 11/18/2015   CO2 39 (H) 05/19/2014   BUN 23 (H) 11/18/2015   BUN 20 (H) 05/19/2014   CREATININE 0.70 11/18/2015   CREATININE 0.80 05/19/2014   PROT 7.9 03/29/2015   PROT 8.0 05/18/2014   ALBUMIN 4.4 03/29/2015   ALBUMIN 3.1 (L) 05/18/2014   BILITOT 0.7 03/29/2015   BILITOT 0.3 05/18/2014   ALKPHOS 46 03/29/2015   ALKPHOS 64 05/18/2014   AST 21 03/29/2015   AST 34 05/18/2014   ALT 20 03/29/2015   ALT 42 05/18/2014  .   Total Time in preparing paper work, data evaluation and todays exam - 20 minutes  Jazon Jipson M.D on 11/21/2015 at 9:02 AM    Note: This dictation was prepared with Dragon dictation along with smaller phrase technology. Any transcriptional errors that result from this process are unintentional.

## 2015-11-22 LAB — CULTURE, BLOOD (ROUTINE X 2)
Culture: NO GROWTH
Culture: NO GROWTH

## 2015-12-17 DIAGNOSIS — H2513 Age-related nuclear cataract, bilateral: Secondary | ICD-10-CM | POA: Insufficient documentation

## 2016-01-20 DIAGNOSIS — H402233 Chronic angle-closure glaucoma, bilateral, severe stage: Secondary | ICD-10-CM | POA: Insufficient documentation

## 2016-04-21 ENCOUNTER — Other Ambulatory Visit: Payer: Self-pay | Admitting: Infectious Diseases

## 2016-04-21 DIAGNOSIS — Z1231 Encounter for screening mammogram for malignant neoplasm of breast: Secondary | ICD-10-CM

## 2016-06-01 ENCOUNTER — Ambulatory Visit
Admission: RE | Admit: 2016-06-01 | Discharge: 2016-06-01 | Disposition: A | Discharge status: Home / Self Care | Payer: Medicare Other | Source: Ambulatory Visit | Attending: Infectious Diseases | Admitting: Infectious Diseases

## 2016-06-01 DIAGNOSIS — Z1231 Encounter for screening mammogram for malignant neoplasm of breast: Secondary | ICD-10-CM | POA: Insufficient documentation

## 2016-07-30 DIAGNOSIS — B0229 Other postherpetic nervous system involvement: Secondary | ICD-10-CM | POA: Insufficient documentation

## 2017-03-22 ENCOUNTER — Other Ambulatory Visit: Payer: Self-pay

## 2017-03-22 ENCOUNTER — Ambulatory Visit
Payer: Medicare Other | Attending: Student in an Organized Health Care Education/Training Program | Admitting: Student in an Organized Health Care Education/Training Program

## 2017-03-22 ENCOUNTER — Encounter: Payer: Self-pay | Admitting: Student in an Organized Health Care Education/Training Program

## 2017-03-22 VITALS — BP 128/49 | HR 74 | Ht 68.0 in | Wt 130.0 lb

## 2017-03-22 DIAGNOSIS — B0229 Other postherpetic nervous system involvement: Secondary | ICD-10-CM | POA: Diagnosis not present

## 2017-03-22 DIAGNOSIS — M5414 Radiculopathy, thoracic region: Secondary | ICD-10-CM | POA: Diagnosis not present

## 2017-03-22 DIAGNOSIS — Z803 Family history of malignant neoplasm of breast: Secondary | ICD-10-CM | POA: Diagnosis not present

## 2017-03-22 DIAGNOSIS — Z7952 Long term (current) use of systemic steroids: Secondary | ICD-10-CM | POA: Diagnosis not present

## 2017-03-22 DIAGNOSIS — G894 Chronic pain syndrome: Secondary | ICD-10-CM | POA: Diagnosis not present

## 2017-03-22 DIAGNOSIS — D869 Sarcoidosis, unspecified: Secondary | ICD-10-CM | POA: Insufficient documentation

## 2017-03-22 DIAGNOSIS — Z8249 Family history of ischemic heart disease and other diseases of the circulatory system: Secondary | ICD-10-CM | POA: Insufficient documentation

## 2017-03-22 DIAGNOSIS — Z91041 Radiographic dye allergy status: Secondary | ICD-10-CM | POA: Diagnosis not present

## 2017-03-22 DIAGNOSIS — I272 Pulmonary hypertension, unspecified: Secondary | ICD-10-CM | POA: Insufficient documentation

## 2017-03-22 DIAGNOSIS — F329 Major depressive disorder, single episode, unspecified: Secondary | ICD-10-CM | POA: Insufficient documentation

## 2017-03-22 DIAGNOSIS — Z79891 Long term (current) use of opiate analgesic: Secondary | ICD-10-CM

## 2017-03-22 DIAGNOSIS — I129 Hypertensive chronic kidney disease with stage 1 through stage 4 chronic kidney disease, or unspecified chronic kidney disease: Secondary | ICD-10-CM | POA: Insufficient documentation

## 2017-03-22 DIAGNOSIS — H409 Unspecified glaucoma: Secondary | ICD-10-CM | POA: Diagnosis not present

## 2017-03-22 DIAGNOSIS — Z79899 Other long term (current) drug therapy: Secondary | ICD-10-CM | POA: Diagnosis not present

## 2017-03-22 DIAGNOSIS — J449 Chronic obstructive pulmonary disease, unspecified: Secondary | ICD-10-CM | POA: Insufficient documentation

## 2017-03-22 DIAGNOSIS — Z8543 Personal history of malignant neoplasm of ovary: Secondary | ICD-10-CM | POA: Insufficient documentation

## 2017-03-22 NOTE — Patient Instructions (Addendum)
1. UDS today 2. Discuss intercostal nerve blocks at next visit VS thoracic facets

## 2017-03-22 NOTE — Progress Notes (Signed)
Patient's Name: Autumn [name]  MRN: [id number]  Referring Provider: Leonel Ramsay, MD  DOB: 08-10-1945  PCP: Leonel Ramsay, MD  DOS: 03/22/2017  Note by: Gillis Santa, MD  Service setting: Ambulatory outpatient  Specialty: Interventional Pain Management  Location: [facility] (AMB) Pain Management Facility  Visit type: Initial Patient Evaluation  Patient type: New Patient   Primary Reason(s) for Visit: Encounter for initial evaluation of one or more chronic problems (new to examiner) potentially causing chronic pain, and posing a threat to normal musculoskeletal function. (Level of risk: High) CC: Back Pain (mid)  HPI  Autumn [name] is a 71 y.o. year old, female patient, who comes today to see [place] for the first time for an initial evaluation of her chronic pain. She has Pneumonia; COPD (chronic obstructive pulmonary disease) ([place]); Long term current use of systemic steroids; Post herpetic neuralgia; Pulmonary hypertension ([place]); and Pulmonary sarcoidosis ([place]) on their problem list. Today she comes in for evaluation of her Back Pain (mid)  Pain Assessment: Location: Mid Back Radiating: Radiates from back toward front under breast Onset: More than a month ago Duration:   Quality: Sharp, Aching, Constant Severity: 6 /10 (self-reported pain score)  Note: Reported level is inconsistent with clinical observations. Clinically the patient looks like a 2/10 A 2/10 is viewed as "Mild to Moderate" and described as noticeable and distracting. Impossible to hide from other people. More frequent flare-ups. Still possible to adapt and function close to normal. It can be very annoying and may have occasional stronger flare-ups. With discipline, patients may get used to it and adapt.       When using our objective Pain Scale, levels between 6 and 10/10 are said to belong in an emergency room, as it progressively worsens from a 6/10, described as severely limiting, requiring emergency care not usually available  at an outpatient pain management facility. At a 6/10 level, communication becomes difficult and requires great effort. Assistance to reach the emergency department may be required. Facial flushing and profuse sweating along with potentially dangerous increases in heart rate and blood pressure will be evident. Effect on ADL:   Timing: Constant Modifying factors: Cream and meds  Onset and Duration: Present longer than 3 months Cause of pain: shingles Severity: Getting better, NAS-11 at its worse: 8/10, NAS-11 at its best: 5/10, NAS-11 now: 6/10 and NAS-11 on the average: 7/10 Timing: Evening Aggravating Factors: nothing listed Alleviating Factors: Sleeping and Warm showers or baths Associated Problems: Constipation, Depression, Fatigue and Sweating Quality of Pain: Burning, Constant, Sharp, Throbbing, Tingling and Uncomfortable Previous Examinations or Tests: X-rays, Neurological evaluation and Chiropractic evaluation Previous Treatments: Epidural steroid injections along with DRG trial which was not effective.  The patient comes into the clinics today for the first time for a chronic pain management evaluation.    71 year old female with a history of postherpetic neuralgia status post herpes zoster infection in 2013 and T5-T6 dermatome, history of COPD on home oxygen 1-2 L secondary to sarcoidosis, pulmonary hypertension, chronic prednisone therapy, who presents as a new patient with pain in her back and chest wall region.  Patient has tried various interventional therapies in the past including bilateral T5/6 transforaminal epidural steroid injection with no pain relief on June 2018.  She is also status post a DRG spinal cord stimulator trial with no pain relief on May 2018.  In terms of medications, she is on Belbuca 600 mcg twice daily and gabapentin 2400 mg daily.  She has been on buprenorphine  for approximately 1 year.  Dose was escalated from 150 mcg twice daily to 600 mg twice daily.   Patient notes that she started to endorse some benefit after 400 mcg twice daily.  She states that these medications help her quality of life and function.  Patient was previously seeing [facility] for her thoracic radiculopathy and post herpetic neuralgia.  Today I took the time to provide the patient with information regarding my pain practice. The patient was informed that my practice is divided into two sections: an interventional pain management section, as well as a completely separate and distinct medication management section. I explained that I have procedure days for my interventional therapies, and evaluation days for follow-ups and medication management. Because of the amount of documentation required during both, they are kept separated. This means that there is the possibility that she may be scheduled for a procedure on one day, and medication management the next. I have also informed her that because of staffing and facility limitations, I no longer take patients for medication management only. To illustrate the reasons for this, I gave the patient the example of surgeons, and how inappropriate it would be to refer a patient to his/her care, just to write for the post-surgical antibiotics on a surgery done by a different surgeon.   Because interventional pain management is my board-certified specialty, the patient was informed that joining my practice means that they are open to any and all interventional therapies. I made it clear that this does not mean that they will be forced to have any procedures done. What this means is that I believe interventional therapies to be essential part of the diagnosis and proper management of chronic pain conditions. Therefore, patients not interested in these interventional alternatives will be better served under the care of a different practitioner.  The patient was also made aware of my Comprehensive Pain Management Safety Guidelines where  by joining my practice, they limit all of their nerve blocks and joint injections to those done by our practice, for as long as we are retained to manage their care.   Historic Controlled Substance Pharmacotherapy Review  PMP and historical list of controlled substances: Belbuca 600 mcg film BID, 1200 mcg total MME/day: Approx 36 mg/day (1200 mcg x 0.03) Medications: The patient did not bring the medication(s) to the appointment, as requested in our "New Patient Package" Pharmacodynamics: Desired effects: Analgesia: The patient reports >50% benefit. Reported improvement in function: The patient reports medication allows her to accomplish basic ADLs. Clinically meaningful improvement in function (CMIF): Sustained CMIF goals met Perceived effectiveness: Described as relatively effective, allowing for increase in activities of daily living (ADL) Undesirable effects: Side-effects or Adverse reactions: None reported Historical Monitoring: The patient  reports that she does not use drugs. List of all UDS Test(s): No results found for: MDMA, COCAINSCRNUR, [place], [place], CANNABQUANT, [place], [place] List of other Serum/Urine Drug Screening Test(s):  No results found for: AMPHSCRSER, BARBSCRSER, BENZOSCRSER, COCAINSCRSER, COCAINSCRNUR, PCPSCRSER, PCPQUANT, THCSCRSER, THCU, CANNABQUANT, OPIATESCRSER, OXYSCRSER, PROPOXSCRSER, ETH Historical Background Evaluation: [place] PMP: Six (6) year initial data search conducted.             PMP NARX Score Report:  Narcotic: 460 Sedative: 190 Stimulant: 0 [place] Department of public safety, offender search: Editor, commissioning Information) Non-contributory Risk Assessment Profile: Aberrant behavior: None observed or detected today Risk factors for fatal opioid overdose: None identified today PMP NARX Overdose Risk Score: 390 Fatal overdose hazard ratio (HR): Calculation deferred Non-fatal overdose  hazard ratio (HR): Calculation deferred Risk of opioid abuse or dependence:  0.7-3.0% with doses ? 36 MME/day and 6.1-26% with doses ? 120 MME/day. Substance use disorder (SUD) risk level: Low Opioid risk tool (ORT) (Total Score): 3 Opioid Risk Tool - 03/22/17 1130      Family History of Substance Abuse   Alcohol  Negative    Illegal Drugs  Positive Female    Rx Drugs  Negative      Personal History of Substance Abuse   Alcohol  Negative    Illegal Drugs  Negative    Rx Drugs  Negative      Age   Age between 38-45 years   No      History of Preadolescent Sexual Abuse   History of Preadolescent Sexual Abuse  Negative or Female      Psychological Disease   Psychological Disease  Negative    Depression  Positive      Total Score   Opioid Risk Tool Scoring  3    Opioid Risk Interpretation  Low Risk      ORT Scoring interpretation table:  Score <3 = Low Risk for SUD  Score between 4-7 = Moderate Risk for SUD  Score >8 = High Risk for Opioid Abuse    Pharmacologic Plan: Further information needed.            Initial impression: No immediate contraindications found.  Meds   Current Outpatient Medications:  .  albuterol (PROVENTIL) (2.5 MG/3ML) 0.083% nebulizer solution, Take 2.5 mg by nebulization every 4 (four) hours as needed. , Disp: , Rfl:  .  atenolol (TENORMIN) 25 MG tablet, Take 1 tablet by mouth 2 (two) times daily., Disp: , Rfl:  .  atorvastatin (LIPITOR) 10 MG tablet, Take 1 tablet by mouth daily at 6 PM., Disp: , Rfl:  .  CALCIUM 600+D3 600-800 MG-UNIT TABS, Take 1 tablet by mouth 2 (two) times daily with a meal., Disp: , Rfl: 11 .  Fluticasone-Salmeterol (ADVAIR DISKUS) 250-50 MCG/DOSE AEPB, Inhale into the lungs., Disp: , Rfl:  .  gabapentin (NEURONTIN) 300 MG capsule, Take 600 mg by mouth 3 (three) times daily., Disp: , Rfl:  .  predniSONE (DELTASONE) 20 MG tablet, 78m daily x 3 days, then 268mdaily x 5 days, then 1062maily x 4 days, Disp: 13 tablet, Rfl: 0 .  amoxicillin-clavulanate (AUGMENTIN) 875-125 MG tablet, Take 1 tablet by  mouth 2 (two) times daily. (Patient not taking: Reported on 03/22/2017), Disp: 20 tablet, Rfl: 0 .  azithromycin (ZITHROMAX) 500 MG tablet, Take 1 tablet (500 mg total) by mouth daily. (Patient not taking: Reported on 03/22/2017), Disp: 3 tablet, Rfl: 0 .  BELBUCA 600 MCG FILM, PLACE 1 FILM INSIDE CHEEK EVERY 12 HOURS DNF 12/06, Disp: , Rfl: 0 .  Brinzolamide-Brimonidine 1-0.2 % SUSP, Apply 1 drop to eye 3 (three) times daily., Disp: , Rfl:  .  HYDROcodone-acetaminophen (NORCO/VICODIN) 5-325 MG tablet, Take 1-2 tablets by mouth every 4 (four) hours as needed for moderate pain. (Patient not taking: Reported on 03/22/2017), Disp: 30 tablet, Rfl: 0 .  ibuprofen (ADVIL,MOTRIN) 200 MG tablet, Take 200 mg by mouth every 6 (six) hours as needed., Disp: , Rfl:  .  Ibuprofen-Diphenhydramine HCl (ADVIL PM) 200-25 MG CAPS, Take 1 tablet by mouth at bedtime as needed., Disp: , Rfl:  .  latanoprost (XALATAN) 0.005 % ophthalmic solution, Apply 1 drop to eye at bedtime., Disp: , Rfl:  .  lidocaine-prilocaine (EMLA) cream, Apply 1  application topically 2 (two) times daily as needed., Disp: , Rfl:  .  Omega-3 Fatty Acids (FISH OIL PO), Take 1 capsule by mouth every evening., Disp: , Rfl:  .  ondansetron (ZOFRAN) 4 MG tablet, Take 1 tablet (4 mg total) by mouth every 6 (six) hours as needed for nausea. (Patient not taking: Reported on 03/22/2017), Disp: 20 tablet, Rfl: 0 .  sertraline (ZOLOFT) 25 MG tablet, Take 25 mg by mouth daily., Disp: , Rfl: 11 .  tiotropium (SPIRIVA HANDIHALER) 18 MCG inhalation capsule, Place 1 capsule into inhaler and inhale daily., Disp: , Rfl:  .  traMADol (ULTRAM) 50 MG tablet, Take 1 tablet by mouth 2 (two) times daily., Disp: , Rfl:    ROS  Cardiovascular History: High blood pressure and Heart murmur Pulmonary or Respiratory History: Lung problems, Shortness of breath and Sarcoidosis Neurological History: No reported neurological signs or symptoms such as seizures, abnormal skin  sensations, urinary and/or fecal incontinence, being born with an abnormal open spine and/or a tethered spinal cord Review of Past Neurological Studies: No results found for this or any previous visit. Psychological-Psychiatric History: No reported psychological or psychiatric signs or symptoms such as difficulty sleeping, anxiety, depression, delusions or hallucinations (schizophrenial), mood swings (bipolar disorders) or suicidal ideations or attempts Gastrointestinal History: Irregular, infrequent bowel movements (Constipation) Genitourinary History: No reported renal or genitourinary signs or symptoms such as difficulty voiding or producing urine, peeing blood, non-functioning kidney, kidney stones, difficulty emptying the bladder, difficulty controlling the flow of urine, or chronic kidney disease Hematological History: No reported hematological signs or symptoms such as prolonged bleeding, low or poor functioning platelets, bruising or bleeding easily, hereditary bleeding problems, low energy levels due to low hemoglobin or being anemic Endocrine History: No reported endocrine signs or symptoms such as high or low blood sugar, rapid heart rate due to high thyroid levels, obesity or weight gain due to slow thyroid or thyroid disease Rheumatologic History: No reported rheumatological signs and symptoms such as fatigue, joint pain, tenderness, swelling, redness, heat, stiffness, decreased range of motion, with or without associated rash Musculoskeletal History: Negative for myasthenia gravis, muscular dystrophy, multiple sclerosis or malignant hyperthermia Work History: Retired  Allergies  Ms. Carreto is allergic to ivp dye [iodinated diagnostic agents].  Laboratory Chemistry  Inflammation Markers (CRP: Acute Phase) (ESR: Chronic Phase) Lab Results  Component Value Date   ESRSEDRATE 48 (H) 05/25/2011                 Rheumatology Markers No results found for: RF, ANA, LABURIC, URICUR,  LYMEIGGIGMAB, LYMEABIGMQN              Renal Function Markers Lab Results  Component Value Date   BUN 23 (H) 11/18/2015   CREATININE 0.70 11/18/2015   GFRAA >60 11/18/2015   GFRNONAA >60 11/18/2015                 Hepatic Function Markers Lab Results  Component Value Date   AST 21 03/29/2015   ALT 20 03/29/2015   ALBUMIN 4.4 03/29/2015   ALKPHOS 46 03/29/2015   LIPASE 116 12/15/2013                 Electrolytes Lab Results  Component Value Date   NA 139 11/18/2015   K 3.6 11/18/2015   CL 97 (L) 11/18/2015   CALCIUM 9.7 11/18/2015                 Neuropathy Markers No results found for:  VITAMINB12, FOLATE, HGBA1C, HIV               Bone Pathology Markers No results found for: VD25OH, [id number], [id number], [id number], 25OHVITD1, 25OHVITD2, 25OHVITD3, TESTOFREE, TESTOSTERONE               Coagulation Parameters Lab Results  Component Value Date   INR 1.0 02/17/2013   LABPROT 13.2 02/17/2013   PLT 299 11/20/2015                 Cardiovascular Markers Lab Results  Component Value Date   CKTOTAL 66 02/17/2013   CKMB 0.7 02/17/2013   TROPONINI 0.04 (HH) 11/17/2015   HGB 11.6 (L) 11/20/2015   HCT 34.5 (L) 11/20/2015                 CA Markers No results found for: CEA, CA125, LABCA2               Note: Lab results reviewed.  [place]  Drug: Ms. Stimson  reports that she does not use drugs. Alcohol:  reports that she does not drink alcohol. Tobacco:  reports that  has never smoked. she has never used smokeless tobacco. Medical:  has a past medical history of Allergy, COPD (chronic obstructive pulmonary disease) ([place]), Glaucoma, Hypertension, Osteoporosis, Ovarian cancer ([place]), and Sarcoid. Family: family history includes Breast cancer (age of onset: 70) in her mother; Cancer in her mother; Hypertension in her father.  Past Surgical History:  Procedure Laterality Date  . ABDOMINAL HYSTERECTOMY    . BREAST BIOPSY Right    Active Ambulatory Problems     Diagnosis Date Noted  . Pneumonia 11/17/2015  . COPD (chronic obstructive pulmonary disease) ([place]) 03/22/2017  . Long term current use of systemic steroids 02/14/2015  . Post herpetic neuralgia 07/30/2016  . Pulmonary hypertension ([place]) 10/05/2013  . Pulmonary sarcoidosis ([place]) 10/11/1999   Resolved Ambulatory Problems    Diagnosis Date Noted  . No Resolved Ambulatory Problems   Past Medical History:  Diagnosis Date  . Allergy   . COPD (chronic obstructive pulmonary disease) ([place])   . Glaucoma   . Hypertension   . Osteoporosis   . Ovarian cancer ([place])   . Sarcoid    Constitutional Exam  General appearance: Well nourished, well developed, and well hydrated. In no apparent acute distress Vitals:   03/22/17 1109  BP: (!) 128/49  Pulse: 74  SpO2: 95%  Weight: 130 lb (59 kg)  Height: _0  (1.727 m)   BMI Assessment: Estimated body mass index is 19.77 kg/m as calculated from the following:   Height as of this encounter: _1  (1.727 m).   Weight as of this encounter: 130 lb (59 kg).  BMI interpretation table: BMI level Category Range association with higher incidence of chronic pain  <18 kg/m2 Underweight   18.5-24.9 kg/m2 Ideal body weight   25-29.9 kg/m2 Overweight Increased incidence by 20%  30-34.9 kg/m2 Obese (Class I) Increased incidence by 68%  35-39.9 kg/m2 Severe obesity (Class II) Increased incidence by 136%  >40 kg/m2 Extreme obesity (Class III) Increased incidence by 254%   BMI Readings from Last 4 Encounters:  03/22/17 19.77 kg/m  11/21/15 21.80 kg/m  03/29/15 22.66 kg/m   Wt Readings from Last 4 Encounters:  03/22/17 130 lb (59 kg)  11/21/15 139 lb 3.2 oz (63.1 kg)  03/29/15 149 lb (67.6 kg)  Psych/Mental status: Alert, oriented x 3 (person, place, & time)       Eyes: PERLA Respiratory: Oxygen-dependent  COPD  Cervical Spine Area Exam  Skin & Axial Inspection: No masses, redness, edema, swelling, or associated skin lesions Alignment:  Symmetrical Functional ROM: Unrestricted ROM      Stability: No instability detected Muscle Tone/Strength: Functionally intact. No obvious neuro-muscular anomalies detected. Sensory (Neurological): Unimpaired Palpation: No palpable anomalies              Upper Extremity (UE) Exam    Side: Right upper extremity  Side: Left upper extremity  Skin & Extremity Inspection: Skin color, temperature, and hair growth are WNL. No peripheral edema or cyanosis. No masses, redness, swelling, asymmetry, or associated skin lesions. No contractures.  Skin & Extremity Inspection: Skin color, temperature, and hair growth are WNL. No peripheral edema or cyanosis. No masses, redness, swelling, asymmetry, or associated skin lesions. No contractures.  Functional ROM: Unrestricted ROM          Functional ROM: Unrestricted ROM          Muscle Tone/Strength: Functionally intact. No obvious neuro-muscular anomalies detected.  Muscle Tone/Strength: Functionally intact. No obvious neuro-muscular anomalies detected.  Sensory (Neurological): Unimpaired          Sensory (Neurological): Unimpaired          Palpation: No palpable anomalies              Palpation: No palpable anomalies              Specialized Test(s): Deferred         Specialized Test(s): Deferred          Thoracic Spine Area Exam  Skin & Axial Inspection: No masses, redness, or swelling Alignment: Symmetrical Functional ROM: Unrestricted ROM Stability: No instability detected Muscle Tone/Strength: Functionally intact. No obvious neuro-muscular anomalies detected. Sensory (Neurological): tenderness of left back and flank region T4-T6 dermatome with some allodynia of left thoracic back Muscle strength & Tone: Tender  Lumbar Spine Area Exam  Skin & Axial Inspection: No masses, redness, or swelling Alignment: Symmetrical Functional ROM: Unrestricted ROM      Stability: No instability detected Muscle Tone/Strength: Functionally intact. No obvious  neuro-muscular anomalies detected. Sensory (Neurological): Unimpaired Palpation: No palpable anomalies       Provocative Tests: Lumbar Hyperextension and rotation test: Positive bilaterally for facet joint pain. Lumbar Lateral bending test: Positive       Patrick's Maneuver: evaluation deferred today                    Gait & Posture Assessment  Ambulation: Unassisted Gait: Relatively normal for age and body habitus Posture: WNL   Lower Extremity Exam    Side: Right lower extremity  Side: Left lower extremity  Skin & Extremity Inspection: Skin color, temperature, and hair growth are WNL. No peripheral edema or cyanosis. No masses, redness, swelling, asymmetry, or associated skin lesions. No contractures.  Skin & Extremity Inspection: Skin color, temperature, and hair growth are WNL. No peripheral edema or cyanosis. No masses, redness, swelling, asymmetry, or associated skin lesions. No contractures.  Functional ROM: Unrestricted ROM          Functional ROM: Unrestricted ROM          Muscle Tone/Strength: Functionally intact. No obvious neuro-muscular anomalies detected.  Muscle Tone/Strength: Functionally intact. No obvious neuro-muscular anomalies detected.  Sensory (Neurological): Unimpaired  Sensory (Neurological): Unimpaired  Palpation: No palpable anomalies  Palpation: No palpable anomalies   Assessment  Primary Diagnosis & Pertinent Problem List: The primary encounter diagnosis was Post herpetic neuralgia. Diagnoses of  Thoracic radiculopathy, Chronic pain syndrome, and Chronic use of opiate for therapeutic purpose were also pertinent to this visit.  Visit Diagnosis (New problems to examiner): 1. Post herpetic neuralgia   2. Thoracic radiculopathy   3. Chronic pain syndrome   4. Chronic use of opiate for therapeutic purpose    71 year old female with a history of left flank and chest pain secondary to postherpetic neuralgia in 2013 who presents for chronic pain management.   Patient was previously being seen at [facility].  Her symptoms are managed on Butrans film:Belbuca 600 mcg twice daily along with gabapentin 2400 mg daily.  She states that the medications allow her to function and improve her neuropathic pain symptoms.  She is status post bilateral T5-T6 transforaminal epidural steroid injection in June 2018 with no pain relief and also status post DRG spinal cord stimulator trial on Aug 16, 2016 without any significant pain relief.  Today I will obtain urine drug screen.  Furthermore we also discussed intercostal nerve blocks versus bilateral T5-T6 facet injections for her symptoms.  Patient is not on any blood thinners would like to discuss these procedures in the future.  At this point I believe the patient is low risk for opioid misuse abuse.  She is stable on buccal Butrans film without any compliance concerns.  She will follow-up with me in 1 month for medication management and further discussion of  intercostal nerve blocks versus thoracic facet injections.  Plan: -UDS today -Continue Belbuca at current dose 600 mcg twice daily along with gabapentin 2400 mg's daily.  Patient has prescription for month of January from previous pain provider. -Will discuss/schedule intercostal nerve blocks versus thoracic facet injections at T5/6 bilaterally at next visit.  Orders Placed This Encounter  Procedures  . Compliance Drug Analysis, Ur    Interventional management options: Ms. Rua was informed that there is no guarantee that she would be a candidate for interventional therapies. The decision will be based on the results of diagnostic studies, as well as Ms. Przybysz's risk profile.  Procedure(s) under consideration:  Bilateral T5/6 intercostal nerve blocks under ultrasound guidance -Thoracic facet injections at T5-T6 bilaterally.-   Provider-requested follow-up: Return in about 4 weeks (around 04/19/2017) for Medication Management.  Future  Appointments  Date Time Provider [employer]  04/19/2017 10:15 AM Gillis Santa, MD [facility] None    Primary Care Physician: Leonel Ramsay, MD Location: [facility] Outpatient Pain Management Facility Note by: Gillis Santa, M.D, Date: 03/22/2017; Time: 1:24 PM  Patient Instructions  1. UDS today 2. Discuss intercostal nerve blocks at next visit VS thoracic facets

## 2017-03-28 LAB — COMPLIANCE DRUG ANALYSIS, UR

## 2017-04-19 ENCOUNTER — Encounter: Payer: Medicare Other | Admitting: Student in an Organized Health Care Education/Training Program

## 2017-04-26 ENCOUNTER — Ambulatory Visit
Payer: Medicare Other | Attending: Student in an Organized Health Care Education/Training Program | Admitting: Student in an Organized Health Care Education/Training Program

## 2017-04-26 ENCOUNTER — Encounter: Payer: Self-pay | Admitting: Student in an Organized Health Care Education/Training Program

## 2017-04-26 ENCOUNTER — Other Ambulatory Visit: Payer: Self-pay

## 2017-04-26 VITALS — BP 190/77 | HR 72 | Temp 98.2°F | Resp 16 | Ht 67.0 in

## 2017-04-26 DIAGNOSIS — G8929 Other chronic pain: Secondary | ICD-10-CM | POA: Diagnosis present

## 2017-04-26 DIAGNOSIS — Z9981 Dependence on supplemental oxygen: Secondary | ICD-10-CM | POA: Insufficient documentation

## 2017-04-26 DIAGNOSIS — J189 Pneumonia, unspecified organism: Secondary | ICD-10-CM | POA: Insufficient documentation

## 2017-04-26 DIAGNOSIS — Z79899 Other long term (current) drug therapy: Secondary | ICD-10-CM | POA: Diagnosis not present

## 2017-04-26 DIAGNOSIS — Z7952 Long term (current) use of systemic steroids: Secondary | ICD-10-CM | POA: Diagnosis not present

## 2017-04-26 DIAGNOSIS — Z79891 Long term (current) use of opiate analgesic: Secondary | ICD-10-CM | POA: Diagnosis not present

## 2017-04-26 DIAGNOSIS — Z760 Encounter for issue of repeat prescription: Secondary | ICD-10-CM | POA: Diagnosis not present

## 2017-04-26 DIAGNOSIS — R079 Chest pain, unspecified: Secondary | ICD-10-CM | POA: Insufficient documentation

## 2017-04-26 DIAGNOSIS — M5414 Radiculopathy, thoracic region: Secondary | ICD-10-CM | POA: Insufficient documentation

## 2017-04-26 DIAGNOSIS — G894 Chronic pain syndrome: Secondary | ICD-10-CM | POA: Insufficient documentation

## 2017-04-26 DIAGNOSIS — I272 Pulmonary hypertension, unspecified: Secondary | ICD-10-CM | POA: Diagnosis not present

## 2017-04-26 DIAGNOSIS — J449 Chronic obstructive pulmonary disease, unspecified: Secondary | ICD-10-CM | POA: Diagnosis not present

## 2017-04-26 DIAGNOSIS — B0229 Other postherpetic nervous system involvement: Secondary | ICD-10-CM

## 2017-04-26 DIAGNOSIS — R1010 Upper abdominal pain, unspecified: Secondary | ICD-10-CM | POA: Diagnosis present

## 2017-04-26 DIAGNOSIS — D860 Sarcoidosis of lung: Secondary | ICD-10-CM | POA: Insufficient documentation

## 2017-04-26 MED ORDER — BELBUCA 600 MCG BU FILM: 600.0000 ug | ORAL_FILM | Freq: Two times a day (BID) | BUCCAL | 2 refills | Status: DC

## 2017-04-26 MED ORDER — GABAPENTIN 300 MG PO CAPS: ORAL_CAPSULE | ORAL | 2 refills | Status: DC

## 2017-04-26 NOTE — Progress Notes (Signed)
Safety precautions to be maintained throughout the outpatient stay will include: orient to surroundings, keep bed in low position, maintain call bell within reach at all times, provide assistance with transfer out of bed and ambulation.  

## 2017-04-26 NOTE — Progress Notes (Signed)
Patient's Name: Autumn [name]  MRN: [id number]  Referring Provider: Leonel Ramsay, MD  DOB: [birth year]  PCP: Leonel Ramsay, MD  DOS: 04/26/2017  Note by: Gillis Santa, MD  Service setting: Ambulatory outpatient  Specialty: Interventional Pain Management  Location: [facility] (AMB) Pain Management Facility    Patient type: Established   Primary Reason(s) for Visit: Encounter for prescription drug management. (Level of risk: moderate)  CC: Abdominal Pain (upper)  HPI  Autumn [name] is a 72 y.o. year old, female patient, who comes today for a medication management evaluation. She has Pneumonia; COPD (chronic obstructive pulmonary disease) ([place]); Long term current use of systemic steroids; Post herpetic neuralgia; Pulmonary hypertension (HCC); and Pulmonary sarcoidosis (HCC) on their problem list. Her primarily concern today is the Abdominal Pain (upper)  Pain Assessment: Location: Left Abdomen Radiating: under breast bone to mid back Onset: More than a month ago Duration: Chronic pain Quality: Aching, Burning Severity: 7 /10 (self-reported pain score)  Note: Reported level is inconsistent with clinical observations. Clinically the patient looks like a 2/10 A 2/10 is viewed as "Mild to Moderate" and described as noticeable and distracting. Impossible to hide from other people. More frequent flare-ups. Still possible to adapt and function close to normal. It can be very annoying and may have occasional stronger flare-ups. With discipline, patients may get used to it and adapt.       When using our objective Pain Scale, levels between 6 and 10/10 are said to belong in an emergency room, as it progressively worsens from a 6/10, described as severely limiting, requiring emergency care not usually available at an outpatient pain management facility. At a 6/10 level, communication becomes difficult and requires great effort. Assistance to reach the emergency department may be required. Facial flushing and  profuse sweating along with potentially dangerous increases in heart rate and blood pressure will be evident. Effect on ADL: hand to ambulate, sleeps on back Timing: Constant Modifying factors: medications, cream  [name] was last scheduled for an appointment on 03/22/2017 for medication management. During today's appointment we reviewed Autumn [name]'s chronic pain status, as well as her outpatient medication regimen.  The patient  reports that she does not use drugs. Her body mass index is 20.36 kg/m.  Further details on both, my assessment(s), as well as the proposed treatment plan, please see below.  Controlled Substance Pharmacotherapy Assessment REMS (Risk Evaluation and Mitigation Strategy)  PMP and historical list of controlled substances: Belbuca 600 mcg film BID, 1200 mcg total MME/day: Approx 36 mg/day (1200 mcg x 0.03) Autumn Specking, RN  04/26/2017  1:53 PM  Sign at close encounter Safety precautions to be maintained throughout the outpatient stay will include: orient to surroundings, keep bed in low position, maintain call bell within reach at all times, provide assistance with transfer out of bed and ambulation.    Pharmacokinetics: Liberation and absorption (onset of action): WNL Distribution (time to peak effect): WNL Metabolism and excretion (duration of action): WNL         Pharmacodynamics: Desired effects: Analgesia: [name] reports >50% benefit. Functional ability: Patient reports that medication allows her to accomplish basic ADLs Clinically meaningful improvement in function (CMIF): Sustained CMIF goals met Perceived effectiveness: Described as relatively effective, allowing for increase in activities of daily living (ADL) Undesirable effects: Side-effects or Adverse reactions: None reported Monitoring: [place] PMP: Online review of the past 6-monthperiod conducted. Compliant with practice rules and regulations Last UDS on record: Summary  Date  Value Ref  Range Status  03/22/2017 FINAL  Final    Comment:    ==================================================================== TOXASSURE COMP DRUG ANALYSIS,UR ==================================================================== Test                             Result       Flag       Units Drug Present and Declared for Prescription Verification   Buprenorphine                  23           EXPECTED   ng/mg creat   Norbuprenorphine               112          EXPECTED   ng/mg creat    Source of buprenorphine is a scheduled prescription medication.    Norbuprenorphine is an expected metabolite of buprenorphine.   Gabapentin                     PRESENT      EXPECTED   Sertraline                     PRESENT      EXPECTED   Desmethylsertraline            PRESENT      EXPECTED    Desmethylsertraline is an expected metabolite of sertraline.   Atenolol                       PRESENT      EXPECTED Drug Absent but Declared for Prescription Verification   Hydrocodone                    Not Detected UNEXPECTED ng/mg creat   Tramadol                       Not Detected UNEXPECTED ng/mg creat   Acetaminophen                  Not Detected UNEXPECTED    Acetaminophen, as indicated in the declared medication list, is    not always detected even when used as directed.   Ibuprofen                      Not Detected UNEXPECTED    Ibuprofen, as indicated in the declared medication list, is not    always detected even when used as directed.   Diphenhydramine                Not Detected UNEXPECTED ==================================================================== Test                      Result    Flag   Units      Ref Range   Creatinine              182              mg/dL      >=20 ==================================================================== Declared Medications:  The flagging and interpretation on this report are based on the  following declared medications.  Unexpected results may arise from   inaccuracies in the declared medications.  **Note: The testing scope of this panel includes these medications:  Atenolol  Buprenorphine  Diphenhydramine  Gabapentin  Hydrocodone (Hydrocodone-Acetaminophen)  Sertraline  Tramadol  **  Note: The testing scope of this panel does not include small to  moderate amounts of these reported medications:  Acetaminophen (Hydrocodone-Acetaminophen)  Ibuprofen  **Note: The testing scope of this panel does not include following  reported medications:  Albuterol  Amoxicillin (Augmentin)  Atorvastatin  Azithromycin  Brimonidine Tartrate  Brinzolamide  Calcium (Calcium/Vitamin D3)  Clavulinate (Augmentin)  Fluticasone (Advair)  Latanoprost  Omega-3 Fatty Acids  Ondansetron  Prednisone  Salmeterol (Advair)  Tiotropium  Topical  Vitamin D3 (Calcium/Vitamin D3) ==================================================================== For clinical consultation, please call [phone]. ====================================================================    UDS interpretation: Compliant          Medication Assessment Form: Reviewed. Patient indicates being compliant with therapy Treatment compliance: Compliant Risk Assessment Profile: Aberrant behavior: See prior evaluations. None observed or detected today Comorbid factors increasing risk of overdose: See prior notes. No additional risks detected today Risk of substance use disorder (SUD): Low Opioid Risk Tool - 04/26/17 1352      Family History of Substance Abuse   Alcohol  Negative    Illegal Drugs  Positive Female    Rx Drugs  Negative      Personal History of Substance Abuse   Alcohol  Negative    Illegal Drugs  Negative    Rx Drugs  Negative      Total Score   Opioid Risk Tool Scoring  2    Opioid Risk Interpretation  Low Risk      ORT Scoring interpretation table:  Score <3 = Low Risk for SUD  Score between 4-7 = Moderate Risk for SUD  Score >8 = High Risk for Opioid Abuse    Risk Mitigation Strategies:  Patient Counseling: Covered Patient-Prescriber Agreement (PPA): Present and active  Notification to other healthcare providers: Done  Pharmacologic Plan: No change in therapy, at this time.             Laboratory Chemistry  Inflammation Markers (CRP: Acute Phase) (ESR: Chronic Phase) Lab Results  Component Value Date   ESRSEDRATE 48 (H) 05/25/2011                 Rheumatology Markers No results found for: RF, ANA, LABURIC, URICUR, LYMEIGGIGMAB, LYMEABIGMQN              Renal Function Markers Lab Results  Component Value Date   BUN 23 (H) 11/18/2015   CREATININE 0.70 11/18/2015   GFRAA >60 11/18/2015   GFRNONAA >60 11/18/2015                 Hepatic Function Markers Lab Results  Component Value Date   AST 21 03/29/2015   ALT 20 03/29/2015   ALBUMIN 4.4 03/29/2015   ALKPHOS 46 03/29/2015   LIPASE 116 12/15/2013                 Electrolytes Lab Results  Component Value Date   NA 139 11/18/2015   K 3.6 11/18/2015   CL 97 (L) 11/18/2015   CALCIUM 9.7 11/18/2015                 Neuropathy Markers No results found for: VITAMINB12, FOLATE, HGBA1C, HIV               Bone Pathology Markers No results found for: VD25OH, VD125OH2TOT, PO2423NT6, RW4315QM0, 25OHVITD1, 25OHVITD2, 25OHVITD3, TESTOFREE, TESTOSTERONE               Coagulation Parameters Lab Results  Component Value Date   INR 1.0 02/17/2013   LABPROT 13.2  02/17/2013   PLT 299 11/20/2015                 Cardiovascular Markers Lab Results  Component Value Date   CKTOTAL 66 02/17/2013   CKMB 0.7 02/17/2013   TROPONINI 0.04 (HH) 11/17/2015   HGB 11.6 (L) 11/20/2015   HCT 34.5 (L) 11/20/2015                 CA Markers No results found for: CEA, CA125, LABCA2               Note: Lab results reviewed.  Recent Diagnostic Imaging Results  MM SCREENING BREAST TOMO BILATERAL CLINICAL DATA:  Screening.  EXAM: 2D DIGITAL SCREENING BILATERAL MAMMOGRAM WITH CAD AND  ADJUNCT TOMO  COMPARISON:  Previous exam(s).  ACR Breast Density Category c: The breast tissue is heterogeneously dense, which may obscure small masses.  FINDINGS: There are no findings suspicious for malignancy. Images were processed with CAD.  IMPRESSION: No mammographic evidence of malignancy. A result letter of this screening mammogram will be mailed directly to the patient.  RECOMMENDATION: Screening mammogram in one year. (Code:[id number])  BI-RADS CATEGORY  1: Negative.  Electronically Signed   By: Ammie Ferrier M.D.   On: 06/01/2016 12:59  Complexity Note: Imaging results reviewed. Results shared with Ms. Eulas Post, using Layman's terms.                         Meds   Current Outpatient Medications:  .  albuterol (PROVENTIL) (2.5 MG/3ML) 0.083% nebulizer solution, Take 2.5 mg by nebulization every 4 (four) hours as needed. , Disp: , Rfl:  .  atenolol (TENORMIN) 25 MG tablet, Take 1 tablet by mouth 2 (two) times daily., Disp: , Rfl:  .  atorvastatin (LIPITOR) 10 MG tablet, Take 1 tablet by mouth daily at 6 PM., Disp: , Rfl:  .  BELBUCA 600 MCG FILM, Place 600 mcg inside cheek 2 (two) times daily., Disp: 60 each, Rfl: 2 .  Brinzolamide-Brimonidine 1-0.2 % SUSP, Apply 1 drop to eye 3 (three) times daily., Disp: , Rfl:  .  CALCIUM 600+D3 600-800 MG-UNIT TABS, Take 1 tablet by mouth 2 (two) times daily with a meal., Disp: , Rfl: 11 .  Fluticasone-Salmeterol (ADVAIR DISKUS) 250-50 MCG/DOSE AEPB, Inhale into the lungs., Disp: , Rfl:  .  gabapentin (NEURONTIN) 300 MG capsule, 1 tablet in AM, 3 tablets PM, and 4 tablets qhs, Disp: 240 capsule, Rfl: 2 .  ibuprofen (ADVIL,MOTRIN) 200 MG tablet, Take 200 mg by mouth every 6 (six) hours as needed., Disp: , Rfl:  .  Ibuprofen-Diphenhydramine HCl (ADVIL PM) 200-25 MG CAPS, Take 1 tablet by mouth at bedtime as needed., Disp: , Rfl:  .  latanoprost (XALATAN) 0.005 % ophthalmic solution, Apply 1 drop to eye at bedtime., Disp: , Rfl:  .   lidocaine-prilocaine (EMLA) cream, Apply 1 application topically 2 (two) times daily as needed., Disp: , Rfl:  .  Omega-3 Fatty Acids (FISH OIL PO), Take 1 capsule by mouth every evening., Disp: , Rfl:  .  sertraline (ZOLOFT) 25 MG tablet, Take 25 mg by mouth daily., Disp: , Rfl: 11 .  tiotropium (SPIRIVA HANDIHALER) 18 MCG inhalation capsule, Place 1 capsule into inhaler and inhale daily., Disp: , Rfl:  .  traMADol (ULTRAM) 50 MG tablet, Take 1 tablet by mouth 2 (two) times daily., Disp: , Rfl:  .  HYDROcodone-acetaminophen (NORCO/VICODIN) 5-325 MG tablet, Take 1-2 tablets by mouth every 4 (  four) hours as needed for moderate pain. (Patient not taking: Reported on 03/22/2017), Disp: 30 tablet, Rfl: 0 .  ondansetron (ZOFRAN) 4 MG tablet, Take 1 tablet (4 mg total) by mouth every 6 (six) hours as needed for nausea. (Patient not taking: Reported on 03/22/2017), Disp: 20 tablet, Rfl: 0 .  predniSONE (DELTASONE) 20 MG tablet, 39m daily x 3 days, then 224mdaily x 5 days, then 1048maily x 4 days (Patient not taking: Reported on 04/26/2017), Disp: 13 tablet, Rfl: 0  ROS  Constitutional: Denies any fever or chills Gastrointestinal: No reported hemesis, hematochezia, vomiting, or acute GI distress Musculoskeletal: Denies any acute onset joint swelling, redness, loss of ROM, or weakness Neurological: No reported episodes of acute onset apraxia, aphasia, dysarthria, agnosia, amnesia, paralysis, loss of coordination, or loss of consciousness  Allergies  Ms. [place] allergic to ivp dye [iodinated diagnostic agents].  PFSFillmorerug: Ms. CarMinnieareports that she does not use drugs. Alcohol:  reports that she does not drink alcohol. Tobacco:  reports that  has never smoked. she has never used smokeless tobacco. Medical:  has a past medical history of Allergy, COPD (chronic obstructive [place] disease) ([place], Hypertension, Osteoporosis, Ovarian cancer ([place] Sarcoid. Surgical: Ms. CarMohammedas a  past surgical history that includes Breast biopsy (Right) and Abdominal hysterectomy. Family: family history includes Breast cancer (age of onset: 70)69n her mother; Cancer in her mother; Hypertension in her father.  Constitutional Exam  General appearance: Well nourished, well developed, and well hydrated. In no apparent acute distress Vitals:   04/26/17 1340 04/26/17 1342  BP:  (!) 190/77  Pulse: 72   Resp: 16   Temp: 98.2 F (36.8 C)   SpO2: 95%   Height: _0  (1.702 m)    BMI Assessment: Estimated body mass index is 20.36 kg/m as calculated from the following:   Height as of this encounter: _1  (1.702 m).   Weight as of 03/22/17: 130 lb (59 kg).  BMI interpretation table: BMI level Category Range association with higher incidence of chronic pain  <18 kg/m2 Underweight   18.5-24.9 kg/m2 Ideal body weight   25-29.9 kg/m2 Overweight Increased incidence by 20%  30-34.9 kg/m2 Obese (Class I) Increased incidence by 68%  35-39.9 kg/m2 Severe obesity (Class II) Increased incidence by 136%  >40 kg/m2 Extreme obesity (Class III) Increased incidence by 254%   BMI Readings from Last 4 Encounters:  04/26/17 20.36 kg/m  03/22/17 19.77 kg/m  11/21/15 21.80 kg/m  03/29/15 22.66 kg/m   Wt Readings from Last 4 Encounters:  03/22/17 130 lb (59 kg)  11/21/15 139 lb 3.2 oz (63.1 kg)  03/29/15 149 lb (67.6 kg)  Psych/Mental status: Alert, oriented x 3 (person, place, & time)       Eyes: PERLA Respiratory: Oxygen-dependent COPD  Cervical Spine Area Exam  Skin & Axial Inspection: No masses, redness, edema, swelling, or associated skin lesions Alignment: Symmetrical Functional ROM: Unrestricted ROM      Stability: No instability detected Muscle Tone/Strength: Functionally intact. No obvious neuro-muscular anomalies detected. Sensory (Neurological): Unimpaired Palpation: No palpable anomalies                     Upper Extremity (UE) Exam    Side: Right upper extremity   Side: Left upper extremity   Skin & Extremity Inspection: Skin color, temperature, and hair growth are WNL. No peripheral edema or cyanosis. No masses, redness, swelling, asymmetry, or associated skin lesions.  No contractures.  Skin & Extremity Inspection: Skin color, temperature, and hair growth are WNL. No peripheral edema or cyanosis. No masses, redness, swelling, asymmetry, or associated skin lesions. No contractures.   Functional ROM: Unrestricted ROM          Functional ROM: Unrestricted ROM           Muscle Tone/Strength: Functionally intact. No obvious neuro-muscular anomalies detected.  Muscle Tone/Strength: Functionally intact. No obvious neuro-muscular anomalies detected.   Sensory (Neurological): Unimpaired          Sensory (Neurological): Unimpaired           Palpation: No palpable anomalies              Palpation: No palpable anomalies               Specialized Test(s): Deferred         Specialized Test(s): Deferred           Thoracic Spine Area Exam  Skin & Axial Inspection: No masses, redness, or swelling Alignment: Symmetrical Functional ROM: Unrestricted ROM Stability: No instability detected Muscle Tone/Strength: Functionally intact. No obvious neuro-muscular anomalies detected. Sensory (Neurological): tenderness of left back and flank region T4-T6 dermatome with  allodynia of left thoracic back, at inferior scapular border Muscle strength & Tone: Tender  Lumbar Spine Area Exam  Skin & Axial Inspection: No masses, redness, or swelling Alignment: Symmetrical Functional ROM: Unrestricted ROM      Stability: No instability detected Muscle Tone/Strength: Functionally intact. No obvious neuro-muscular anomalies detected. Sensory (Neurological): Unimpaired Palpation: No palpable anomalies       Provocative Tests: Lumbar Hyperextension and rotation test: Positive bilaterally for facet joint pain. Lumbar Lateral bending test: Positive       Patrick's Maneuver:  evaluation deferred today                    Gait & Posture Assessment  Ambulation: Unassisted Gait: Relatively normal for age and body habitus Posture: WNL   Lower Extremity Exam    Side: Right lower extremity  Side: Left lower extremity  Skin & Extremity Inspection: Skin color, temperature, and hair growth are WNL. No peripheral edema or cyanosis. No masses, redness, swelling, asymmetry, or associated skin lesions. No contractures.  Skin & Extremity Inspection: Skin color, temperature, and hair growth are WNL. No peripheral edema or cyanosis. No masses, redness, swelling, asymmetry, or associated skin lesions. No contractures.  Functional ROM: Unrestricted ROM          Functional ROM: Unrestricted ROM          Muscle Tone/Strength: Functionally intact. No obvious neuro-muscular anomalies detected.  Muscle Tone/Strength: Functionally intact. No obvious neuro-muscular anomalies detected.  Sensory (Neurological): Unimpaired  Sensory (Neurological): Unimpaired  Palpation: No palpable anomalies  Palpation: No palpable anomalies     Assessment  Primary Diagnosis & Pertinent Problem List: The primary encounter diagnosis was Post herpetic neuralgia. Diagnoses of Thoracic radiculopathy, Chronic pain syndrome, and Chronic use of opiate for therapeutic purpose were also pertinent to this visit.  Status Diagnosis  Stable Stable Stable 1. Post herpetic neuralgia   2. Thoracic radiculopathy   3. Chronic pain syndrome   4. Chronic use of opiate for therapeutic purpose       72 year old female with a history of left flank and chest pain secondary to postherpetic neuralgia in 2013 who presents for med refill/ chronic pain management.  Patient was previously being seen at the [facility].  Her symptoms  are managed on [place] film:Belbuca 600 mcg twice daily along with gabapentin 2400 mg daily.  She states that the medications allow her to function and improve her neuropathic  pain symptoms.  She is status post bilateral T5-T6 transforaminal epidural steroid injection in June 2018 with no pain relief and also status post DRG spinal cord stimulator trial on Aug 16, 2016 without any significant pain relief.  In fact the patient is continuing to have left-sided thoracic pain which she states started after her DRG trial.  We discussed T5-T6 facet injections for her symptoms along with thoracic intercostal nerve blocks.  Patient is interested in these procedures but wants to hold off at this time given her concern that previous interventions were not very effective with her last one resulting in ongoing worsening left thoracic pain.  [place] PMP was checked and appropriate.  Patient states that medications allow her to function, improve quality of life, help with range of motion and performing activities of daily living.  We will provide her with refills of Belbuca and gabapentin as below.  We discussed weaning her Belbuca dose at the next visit.  Refill medications as below.  Follow-up in 2 months for medication management.  At that time we will discuss weaning buprenorphine and we can do that after trialing T5-T6 facet injections which may help with pain reduction and assist with her wean.   Plan of Care  Pharmacotherapy (Medications Ordered): Meds ordered this encounter  Medications  . BELBUCA 600 MCG FILM    Sig: Place 600 mcg inside cheek 2 (two) times daily.    Dispense:  60 each    Refill:  2  . gabapentin (NEURONTIN) 300 MG capsule    Sig: 1 tablet in AM, 3 tablets PM, and 4 tablets qhs    Dispense:  240 capsule    Refill:  2   Time Note: Greater than 50% of the 25 minute(s) of face-to-face time spent with Ms. Prehn, was spent in counseling/coordination of care regarding: opioid tolerance, "Drug Holidays", the treatment plan, treatment alternatives, medication side effects, realistic expectations, the goals of pain management (increased in functionality) and  the medication agreement.  Provider-requested follow-up: Return in about 8 weeks (around 06/21/2017) for Medication Management.  Future Appointments  Date Time Provider [place]  06/21/2017 10:00 AM Gillis Santa, MD [facility] None    Primary Care Physician: Leonel Ramsay, MD Location: [facility] Out[facility] Note by: Gillis Santa, M.D Date: 04/26/2017; Time: 2:38 PM  There are no Patient Instructions on file for this visit.

## 2017-05-03 ENCOUNTER — Other Ambulatory Visit: Payer: Self-pay | Admitting: Infectious Diseases

## 2017-05-03 DIAGNOSIS — Z1231 Encounter for screening mammogram for malignant neoplasm of breast: Secondary | ICD-10-CM

## 2017-06-02 ENCOUNTER — Ambulatory Visit
Admission: RE | Admit: 2017-06-02 | Discharge: 2017-06-02 | Disposition: A | Discharge status: Home / Self Care | Payer: Medicare Other | Source: Ambulatory Visit | Attending: Infectious Diseases | Admitting: Infectious Diseases

## 2017-06-02 DIAGNOSIS — Z1231 Encounter for screening mammogram for malignant neoplasm of breast: Secondary | ICD-10-CM | POA: Diagnosis present

## 2017-06-21 ENCOUNTER — Other Ambulatory Visit: Payer: Self-pay

## 2017-06-21 ENCOUNTER — Ambulatory Visit
Payer: Medicare Other | Attending: Student in an Organized Health Care Education/Training Program | Admitting: Student in an Organized Health Care Education/Training Program

## 2017-06-21 ENCOUNTER — Encounter: Payer: Self-pay | Admitting: Student in an Organized Health Care Education/Training Program

## 2017-06-21 VITALS — BP 148/67 | HR 80 | Temp 98.1°F | Resp 16 | Ht 68.0 in | Wt 123.0 lb

## 2017-06-21 DIAGNOSIS — M810 Age-related osteoporosis without current pathological fracture: Secondary | ICD-10-CM | POA: Diagnosis not present

## 2017-06-21 DIAGNOSIS — Z7952 Long term (current) use of systemic steroids: Secondary | ICD-10-CM | POA: Insufficient documentation

## 2017-06-21 DIAGNOSIS — M792 Neuralgia and neuritis, unspecified: Secondary | ICD-10-CM | POA: Insufficient documentation

## 2017-06-21 DIAGNOSIS — Z91041 Radiographic dye allergy status: Secondary | ICD-10-CM | POA: Insufficient documentation

## 2017-06-21 DIAGNOSIS — B0229 Other postherpetic nervous system involvement: Secondary | ICD-10-CM | POA: Diagnosis not present

## 2017-06-21 DIAGNOSIS — Z79891 Long term (current) use of opiate analgesic: Secondary | ICD-10-CM | POA: Insufficient documentation

## 2017-06-21 DIAGNOSIS — M5414 Radiculopathy, thoracic region: Secondary | ICD-10-CM | POA: Diagnosis not present

## 2017-06-21 DIAGNOSIS — G894 Chronic pain syndrome: Secondary | ICD-10-CM | POA: Diagnosis present

## 2017-06-21 DIAGNOSIS — Z79899 Other long term (current) drug therapy: Secondary | ICD-10-CM | POA: Diagnosis not present

## 2017-06-21 MED ORDER — GABAPENTIN 300 MG PO CAPS: ORAL_CAPSULE | ORAL | 2 refills | Status: DC

## 2017-06-21 MED ORDER — BELBUCA 600 MCG BU FILM: 600.0000 ug | ORAL_FILM | Freq: Two times a day (BID) | BUCCAL | 2 refills | Status: DC

## 2017-06-21 NOTE — Patient Instructions (Addendum)
1.  Refill Belbuca and gabapentin as previously prescribed. 2.  Recommend obtaining alpha lipoic acid from over-the-counter to help out with symptoms of neuropathic pain.  Can take up to 600 mg daily.  You have been given 1 script for Belbuca today.  You have gabapentin to be picked up from your pharmacy.

## 2017-06-21 NOTE — Progress Notes (Signed)
Safety precautions to be maintained throughout the outpatient stay will include: orient to surroundings, keep bed in low position, maintain call bell within reach at all times, provide assistance with transfer out of bed and ambulation. O2 @2L  [place]

## 2017-06-21 NOTE — Progress Notes (Signed)
Patient's Name: Autumn [name]  MRN: [id number]  Referring Provider: Leonel Ramsay, MD  DOB: 10-Mar-1946  PCP: Leonel Ramsay, MD  DOS: 06/21/2017  Note by: Gillis Santa, MD  Service setting: Ambulatory outpatient  Specialty: Interventional Pain Management  Location: [facility] (AMB) Pain Management Facility    Patient type: Established   Primary Reason(s) for Visit: Encounter for prescription drug management. (Level of risk: moderate)  CC: Back Pain (mid to upper)  HPI  Autumn [name] is a 72 y.o. year old, female patient, who comes today for a medication management evaluation. She has Pneumonia; COPD (chronic obstructive pulmonary disease) ([place]); Long term current use of systemic steroids; [name] herpetic neuralgia; Pulmonary hypertension (HCC); and Pulmonary sarcoidosis (HCC) on their problem list. Her primarily concern today is the Back Pain (mid to upper)  Pain Assessment: Location: Mid, Lower Back Radiating: radiates from left breast around to back Onset: More than a month ago Duration: Chronic pain Quality: Aching, Burning Severity: 7 /10 (self-reported pain score)  Note: Reported level is inconsistent with clinical observations. Clinically the patient looks like a 3/10 A 3/10 is viewed as "Moderate" and described as significantly interfering with activities of daily living (ADL). It becomes difficult to feed, bathe, get dressed, get on and off the toilet or to perform personal hygiene functions. Difficult to get in and out of bed or a chair without assistance. Very distracting. With effort, it can be ignored when deeply involved in activities.       When using our objective Pain Scale, levels between 6 and 10/10 are said to belong in an emergency room, as it progressively worsens from a 6/10, described as severely limiting, requiring emergency care not usually available at an outpatient pain management facility. At a 6/10 level, communication becomes difficult and requires great effort.  Assistance to reach the emergency department may be required. Facial flushing and profuse sweating along with potentially dangerous increases in heart rate and blood pressure will be evident. Effect on ADL:   Timing: Constant Modifying factors: medications  Autumn [name] was last scheduled for an appointment on 04/26/2017 for medication management. During today's appointment we reviewed Autumn [name]'s chronic pain status, as well as her outpatient medication regimen.  The patient  reports that she does not use drugs. Her body mass index is 18.7 kg/m.  Further details on both, my assessment(s), as well as the proposed treatment plan, please see below.  Controlled Substance Pharmacotherapy Assessment REMS (Risk Evaluation and Mitigation Strategy)  Analgesic: Belbuca 600 mcg BID; MME= Approx 85m/day (1200 mcg x 0.03) Tice, Kori M, RN  06/21/2017 10:33 AM  Sign at close encounter Safety precautions to be maintained throughout the outpatient stay will include: orient to surroundings, keep bed in low position, maintain call bell within reach at all times, provide assistance with transfer out of bed and ambulation. O2 '@2L'  [place]   Pharmacokinetics: Liberation and absorption (onset of action): WNL Distribution (time to peak effect): WNL Metabolism and excretion (duration of action): WNL         Pharmacodynamics: Desired effects: Analgesia: Autumn [name] >50% benefit. Functional ability: Patient reports that medication allows her to accomplish basic ADLs Clinically meaningful improvement in function (CMIF): Sustained CMIF goals met Perceived effectiveness: Described as relatively effective, allowing for increase in activities of daily living (ADL) Undesirable effects: Side-effects or Adverse reactions: None reported Monitoring: [place] PMP: Online review of the past 111-montheriod conducted. Compliant with practice rules and regulations Last UDS on record: Summary  Date Value Ref Range Status   03/22/2017 FINAL  Final    Comment:    ==================================================================== TOXASSURE COMP DRUG ANALYSIS,UR ==================================================================== Test                             Result       Flag       Units Drug Present and Declared for Prescription Verification   Buprenorphine                  23           EXPECTED   ng/mg creat   Norbuprenorphine               112          EXPECTED   ng/mg creat    Source of buprenorphine is a scheduled prescription medication.    Norbuprenorphine is an expected metabolite of buprenorphine.   Gabapentin                     PRESENT      EXPECTED   Sertraline                     PRESENT      EXPECTED   Desmethylsertraline            PRESENT      EXPECTED    Desmethylsertraline is an expected metabolite of sertraline.   Atenolol                       PRESENT      EXPECTED Drug Absent but Declared for Prescription Verification   Hydrocodone                    Not Detected UNEXPECTED ng/mg creat   Tramadol                       Not Detected UNEXPECTED ng/mg creat   Acetaminophen                  Not Detected UNEXPECTED    Acetaminophen, as indicated in the declared medication list, is    not always detected even when used as directed.   Ibuprofen                      Not Detected UNEXPECTED    Ibuprofen, as indicated in the declared medication list, is not    always detected even when used as directed.   Diphenhydramine                Not Detected UNEXPECTED ==================================================================== Test                      Result    Flag   Units      Ref Range   Creatinine              182              mg/dL      >=20 ==================================================================== Declared Medications:  The flagging and interpretation on this report are based on the  following declared medications.  Unexpected results may arise from  inaccuracies in the  declared medications.  **Note: The testing scope of this panel includes these medications:  Atenolol  Buprenorphine  Diphenhydramine  Gabapentin  Hydrocodone (Hydrocodone-Acetaminophen)  Sertraline  Tramadol  **Note: The testing scope of this panel does not include small to  moderate amounts of these reported medications:  Acetaminophen (Hydrocodone-Acetaminophen)  Ibuprofen  **Note: The testing scope of this panel does not include following  reported medications:  Albuterol  Amoxicillin (Augmentin)  Atorvastatin  Azithromycin  Brimonidine Tartrate  Brinzolamide  Calcium (Calcium/Vitamin D3)  Clavulinate (Augmentin)  Fluticasone (Advair)  Latanoprost  Omega-3 Fatty Acids  Ondansetron  Prednisone  Salmeterol (Advair)  Tiotropium  Topical  Vitamin D3 (Calcium/Vitamin D3) ==================================================================== For clinical consultation, please call [phone]. ====================================================================    UDS interpretation: Compliant          Medication Assessment Form: Reviewed. Patient indicates being compliant with therapy Treatment compliance: Compliant Risk Assessment Profile: Aberrant behavior: See prior evaluations. None observed or detected today Comorbid factors increasing risk of overdose: See prior notes. No additional risks detected today Risk of substance use disorder (SUD): Low Opioid Risk Tool - 04/26/17 1352      Family History of Substance Abuse   Alcohol  Negative    Illegal Drugs  Positive Female    Rx Drugs  Negative      Personal History of Substance Abuse   Alcohol  Negative    Illegal Drugs  Negative    Rx Drugs  Negative      Total Score   Opioid Risk Tool Scoring  2    Opioid Risk Interpretation  Low Risk      ORT Scoring interpretation table:  Score <3 = Low Risk for SUD  Score between 4-7 = Moderate Risk for SUD  Score >8 = High Risk for Opioid Abuse   Risk Mitigation  Strategies:  Patient Counseling: Covered Patient-Prescriber Agreement (PPA): Present and active  Notification to other healthcare providers: Done  Pharmacologic Plan: No change in therapy, at this time.             Laboratory Chemistry  Inflammation Markers (CRP: Acute Phase) (ESR: Chronic Phase) Lab Results  Component Value Date   ESRSEDRATE 48 (H) 05/25/2011                         Rheumatology Markers No results found for: RF, ANA, LABURIC, URICUR, LYMEIGGIGMAB, LYMEABIGMQN              Renal Function Markers Lab Results  Component Value Date   BUN 23 (H) 11/18/2015   CREATININE 0.70 11/18/2015   GFRAA >60 11/18/2015   GFRNONAA >60 11/18/2015                 Hepatic Function Markers Lab Results  Component Value Date   AST 21 03/29/2015   ALT 20 03/29/2015   ALBUMIN 4.4 03/29/2015   ALKPHOS 46 03/29/2015   LIPASE 116 12/15/2013                 Electrolytes Lab Results  Component Value Date   NA 139 11/18/2015   K 3.6 11/18/2015   CL 97 (L) 11/18/2015   CALCIUM 9.7 11/18/2015                        Neuropathy Markers No results found for: VITAMINB12, FOLATE, HGBA1C, HIV               Bone Pathology Markers No results found for: [place], [id number], HA1937TK2, IO9735HG9, 25OHVITD1, 25OHVITD2, 25OHVITD3, TESTOFREE, TESTOSTERONE  Coagulation Parameters Lab Results  Component Value Date   INR 1.0 02/17/2013   LABPROT 13.2 02/17/2013   PLT 299 11/20/2015                 Cardiovascular Markers Lab Results  Component Value Date   CKTOTAL 66 02/17/2013   CKMB 0.7 02/17/2013   TROPONINI 0.04 (HH) 11/17/2015   HGB 11.6 (L) 11/20/2015   HCT 34.5 (L) 11/20/2015                 CA Markers No results found for: CEA, CA125, LABCA2               Note: Lab results reviewed.  Recent Diagnostic Imaging Results  MM SCREENING BREAST TOMO BILATERAL CLINICAL DATA:  Screening.  EXAM: DIGITAL SCREENING BILATERAL MAMMOGRAM WITH TOMO AND  CAD  COMPARISON:  Previous exam(s).  ACR Breast Density Category c: The breast tissue is heterogeneously dense, which may obscure small masses.  FINDINGS: There are no findings suspicious for malignancy. Images were processed with CAD.  IMPRESSION: No mammographic evidence of malignancy. A result letter of this screening mammogram will be mailed directly to the patient.  RECOMMENDATION: Screening mammogram in one year. (Code:[id number])  BI-RADS CATEGORY  1: Negative.  Electronically Signed   By: Nolon Nations M.D.   On: 06/02/2017 11:44  Complexity Note: Imaging results reviewed. Results shared with Autumn [name], using Layman's terms.                         Meds   Current Outpatient Medications:  .  albuterol (PROVENTIL) (2.5 MG/3ML) 0.083% nebulizer solution, Take 2.5 mg by nebulization every 4 (four) hours as needed. , Disp: , Rfl:  .  atenolol (TENORMIN) 25 MG tablet, Take 1 tablet by mouth 2 (two) times daily., Disp: , Rfl:  .  atorvastatin (LIPITOR) 10 MG tablet, Take 1 tablet by mouth daily at 6 PM., Disp: , Rfl:  .  [START ON 08/09/2017] BELBUCA 600 MCG FILM, Place 600 mcg inside cheek 2 (two) times daily., Disp: 60 each, Rfl: 2 .  CALCIUM 600+D3 600-800 MG-UNIT TABS, Take 1 tablet by mouth 2 (two) times daily with a meal., Disp: , Rfl: 11 .  denosumab (PROLIA) 60 MG/ML SOLN injection, Inject into the skin., Disp: , Rfl:  .  gabapentin (NEURONTIN) 300 MG capsule, 1 tablet in AM, 3 tablets PM, and 4 tablets qhs, Disp: 240 capsule, Rfl: 2 .  mirtazapine (REMERON) 7.5 MG tablet, every evening., Disp: , Rfl: 11 .  Omega-3 Fatty Acids (FISH OIL PO), Take 1 capsule by mouth every evening., Disp: , Rfl:  .  predniSONE (DELTASONE) 10 MG tablet, Take 10 mg by mouth daily with breakfast. , Disp: , Rfl:  .  sertraline (ZOLOFT) 25 MG tablet, Take 25 mg by mouth daily., Disp: , Rfl: 11 .  tiotropium (SPIRIVA HANDIHALER) 18 MCG inhalation capsule, Place 1 capsule into inhaler and  inhale daily., Disp: , Rfl:  .  Brinzolamide-Brimonidine 1-0.2 % SUSP, Apply 1 drop to eye 3 (three) times daily., Disp: , Rfl:  .  Buprenorphine HCl (BELBUCA) 300 MCG FILM, 300 mcg every 12 (twelve) hours, Disp: , Rfl:  .  Fluticasone-Salmeterol (ADVAIR DISKUS) 250-50 MCG/DOSE AEPB, Inhale into the lungs., Disp: , Rfl:  .  ibuprofen (ADVIL,MOTRIN) 200 MG tablet, Take 200 mg by mouth every 6 (six) hours as needed., Disp: , Rfl:  .  Ibuprofen-Diphenhydramine HCl (ADVIL PM) 200-25 MG CAPS,  Take 1 tablet by mouth at bedtime as needed., Disp: , Rfl:  .  latanoprost (XALATAN) 0.005 % ophthalmic solution, Apply 1 drop to eye at bedtime., Disp: , Rfl:  .  lidocaine-prilocaine (EMLA) cream, Apply 1 application topically 2 (two) times daily as needed., Disp: , Rfl:  .  predniSONE (DELTASONE) 10 MG tablet, , Disp: , Rfl:  .  traMADol (ULTRAM) 50 MG tablet, Take 1 tablet by mouth 2 (two) times daily., Disp: , Rfl:   ROS  Constitutional: Denies any fever or chills Gastrointestinal: No reported hemesis, hematochezia, vomiting, or acute GI distress Musculoskeletal: Denies any acute onset joint swelling, redness, loss of ROM, or weakness Neurological: No reported episodes of acute onset apraxia, aphasia, dysarthria, agnosia, amnesia, paralysis, loss of coordination, or loss of consciousness  Allergies  Autumn [name] is allergic to ivp dye [iodinated diagnostic agents].  [place]  Drug: Autumn [name]  reports that she does not use drugs. Alcohol:  reports that she does not drink alcohol. Tobacco:  reports that  has never smoked. she has never used smokeless tobacco. Medical:  has a past medical history of Allergy, COPD (chronic obstructive pulmonary disease) ([place]), Glaucoma, Hypertension, Osteoporosis, Ovarian cancer ([place]), and Sarcoid. Surgical: Autumn [name]  has a past surgical history that includes Breast biopsy (Right) and Abdominal hysterectomy. Family: family history includes Breast cancer (age of onset: 40) in  her mother; Cancer in her mother; Hypertension in her father.  Constitutional Exam  General appearance: Well nourished, well developed, and well hydrated. In no apparent acute distress Vitals:   06/21/17 1023 06/21/17 1024  BP:  (!) 148/67  Pulse:  80  Resp:  16  Temp:  98.1 F (36.7 C)  SpO2:  96%  Weight: 123 lb (55.8 kg)   Height: '5\' 8"'  (1.727 m)    BMI Assessment: Estimated body mass index is 18.7 kg/m as calculated from the following:   Height as of this encounter: '5\' 8"'  (1.727 m).   Weight as of this encounter: 123 lb (55.8 kg).  BMI interpretation table: BMI level Category Range association with higher incidence of chronic pain  <18 kg/m2 Underweight   18.5-24.9 kg/m2 Ideal body weight   25-29.9 kg/m2 Overweight Increased incidence by 20%  30-34.9 kg/m2 Obese (Class I) Increased incidence by 68%  35-39.9 kg/m2 Severe obesity (Class II) Increased incidence by 136%  >40 kg/m2 Extreme obesity (Class III) Increased incidence by 254%   BMI Readings from Last 4 Encounters:  06/21/17 18.70 kg/m  04/26/17 20.36 kg/m  03/22/17 19.77 kg/m  11/21/15 21.80 kg/m   Wt Readings from Last 4 Encounters:  06/21/17 123 lb (55.8 kg)  03/22/17 130 lb (59 kg)  11/21/15 139 lb 3.2 oz (63.1 kg)  03/29/15 149 lb (67.6 kg)  Psych/Mental status: Alert, oriented x 3 (person, place, & time)       Eyes: PERLA Respiratory: Oxygen-dependent COPD  Cervical Spine Area Exam  Skin & Axial Inspection: No masses, redness, edema, swelling, or associated skin lesions Alignment: Symmetrical Functional ROM: Unrestricted ROM      Stability: No instability detected Muscle Tone/Strength: Functionally intact. No obvious neuro-muscular anomalies detected. Sensory (Neurological): Unimpaired Palpation: No palpable anomalies              Upper Extremity (UE) Exam    Side: Right upper extremity  Side: Left upper extremity  Skin & Extremity Inspection: Skin color, temperature, and hair growth are WNL.  No peripheral edema or cyanosis. No masses, redness, swelling, asymmetry, or associated  skin lesions. No contractures.  Skin & Extremity Inspection: Skin color, temperature, and hair growth are WNL. No peripheral edema or cyanosis. No masses, redness, swelling, asymmetry, or associated skin lesions. No contractures.  Functional ROM: Unrestricted ROM          Functional ROM: Unrestricted ROM          Muscle Tone/Strength: Functionally intact. No obvious neuro-muscular anomalies detected.  Muscle Tone/Strength: Functionally intact. No obvious neuro-muscular anomalies detected.  Sensory (Neurological): Unimpaired          Sensory (Neurological): Unimpaired          Palpation: No palpable anomalies              Palpation: No palpable anomalies              Specialized Test(s): Deferred         Specialized Test(s): Deferred          Thoracic Spine Area Exam  Skin & Axial Inspection:No masses, redness, or swelling Alignment:Symmetrical Functional HFW:YOVZCHYIFOYD ROM Stability:No instability detected Muscle Tone/Strength:Functionally intact. No obvious neuro-muscular anomalies detected. Sensory (Neurological):tenderness of left back and flank region T4-T6 dermatome with  allodynia of left thoracic back, at inferior scapular border Muscle strength & Tone:Tender  Lumbar Spine Area Exam  Skin & Axial Inspection:No masses, redness, or swelling Alignment:Symmetrical Functional XAJ:OINOMVEHMCNO ROM Stability:No instability detected Muscle Tone/Strength:Functionally intact. No obvious neuro-muscular anomalies detected. Sensory (Neurological):Unimpaired Palpation:No palpable anomalies Provocative Tests: Lumbar Hyperextension and rotation test:Positivebilaterally for facet joint pain. Lumbar Lateral bending test:Positive Patrick's Maneuver:evaluation deferred today   Gait & Posture Assessment  Ambulation: Unassisted Gait: Relatively normal for  age and body habitus Posture: WNL   Lower Extremity Exam    Side: Right lower extremity  Side: Left lower extremity  Skin & Extremity Inspection: Skin color, temperature, and hair growth are WNL. No peripheral edema or cyanosis. No masses, redness, swelling, asymmetry, or associated skin lesions. No contractures.  Skin & Extremity Inspection: Skin color, temperature, and hair growth are WNL. No peripheral edema or cyanosis. No masses, redness, swelling, asymmetry, or associated skin lesions. No contractures.  Functional ROM: Unrestricted ROM          Functional ROM: Unrestricted ROM          Muscle Tone/Strength: Functionally intact. No obvious neuro-muscular anomalies detected.  Muscle Tone/Strength: Functionally intact. No obvious neuro-muscular anomalies detected.  Sensory (Neurological): Unimpaired  Sensory (Neurological): Unimpaired  Palpation: No palpable anomalies  Palpation: No palpable anomalies   Assessment  Primary Diagnosis & Pertinent Problem List: The primary encounter diagnosis was [name] herpetic neuralgia. Diagnoses of Thoracic radiculopathy, Chronic pain syndrome, Chronic use of opiate for therapeutic purpose, and Neuropathic pain were also pertinent to this visit.  Status Diagnosis  Persistent Having a Flare-up Persistent 1. [name] herpetic neuralgia   2. Thoracic radiculopathy   3. Chronic pain syndrome   4. Chronic use of opiate for therapeutic purpose   5. Neuropathic pain     General Recommendations: The pain condition that the patient suffers from is best treated with a multidisciplinary approach that involves an increase in physical activity to prevent de-conditioning and worsening of the pain cycle, as well as psychological counseling (formal and/or informal) to address the co-morbid psychological affects of pain. Treatment will often involve judicious use of pain medications and interventional procedures to decrease the pain, allowing the patient to participate in the  physical activity that will ultimately produce long-lasting pain reductions. The goal of the multidisciplinary approach is to  return the patient to a higher level of overall function and to restore their ability to perform activities of daily living.  72 year old female with a history of left flank and chest pain secondary to postherpetic neuralgia in 2013 who presents for med refill/ chronic pain management. Patient was previously being seen at the [facility]. Her symptoms are managed on [place] film:Belbuca600 mcg twice daily along with gabapentin 2400 mg daily. She states that the medications allow her to function and improve her neuropathic pain symptoms. She is status [name] bilateral T5-T6 transforaminal epidural steroid injection in June 2018 with no pain relief and also status [name] DRG spinal cord stimulator trial on Aug 16, 2016 without any significant pain relief.   [place] PMP was checked and appropriate.  Patient states that medications allow her to function, improve quality of life, help with range of motion and performing activities of daily living.  We will provide her with refills of Belbuca and gabapentin as below.  Also recommended the patient try alpha lipoic acid from OTC to assist with symptoms of neuropathy.   Plan of Care  Pharmacotherapy (Medications Ordered): Meds ordered this encounter  Medications  . gabapentin (NEURONTIN) 300 MG capsule    Sig: 1 tablet in AM, 3 tablets PM, and 4 tablets qhs    Dispense:  240 capsule    Refill:  2  . BELBUCA 600 MCG FILM    Sig: Place 600 mcg inside cheek 2 (two) times daily.    Dispense:  60 each    Refill:  2    For chronic pain To last for 30 days from fill date   Future considerations: T5-T6 facet injections   Time Note: Greater than 50% of the 25 minute(s) of face-to-face time spent with Autumn [name], was spent in counseling/coordination of care regarding: opioid tolerance, Autumn [name]'s primary cause of  pain, the treatment plan, treatment alternatives, the risks and possible complications of proposed treatment, medication side effects, the appropriate use of her medications, realistic expectations and the medication agreement..  Provider-requested follow-up: Return in about 4 months (around 10/21/2017) for Medication Management.  Future Appointments  Date Time Provider [place]  10/20/2017 10:00 AM Gillis Santa, MD [facility] None    Primary Care Physician: Leonel Ramsay, MD Location: [facility] Outpatient Pain Management Facility Note by: Gillis Santa, M.D Date: 06/21/2017; Time: 1:41 PM  Patient Instructions  1.  Refill Belbuca and gabapentin as previously prescribed. 2.  Recommend obtaining alpha lipoic acid from over-the-counter to help out with symptoms of neuropathic pain.  Can take up to 600 mg daily.  You have been given 1 script for Belbuca today.  You have gabapentin to be picked up from your pharmacy.

## 2017-07-07 ENCOUNTER — Other Ambulatory Visit: Payer: Self-pay | Admitting: Specialist

## 2017-07-07 DIAGNOSIS — D869 Sarcoidosis, unspecified: Secondary | ICD-10-CM

## 2017-07-07 DIAGNOSIS — R0602 Shortness of breath: Secondary | ICD-10-CM

## 2017-07-20 ENCOUNTER — Ambulatory Visit
Admission: RE | Admit: 2017-07-20 | Discharge: 2017-07-20 | Disposition: A | Discharge status: Home / Self Care | Payer: Medicare Other | Source: Ambulatory Visit | Attending: Specialist | Admitting: Specialist

## 2017-07-20 DIAGNOSIS — R0602 Shortness of breath: Secondary | ICD-10-CM | POA: Diagnosis present

## 2017-07-20 DIAGNOSIS — I700 Atherosclerosis of aorta: Secondary | ICD-10-CM | POA: Diagnosis not present

## 2017-07-20 DIAGNOSIS — I2510 Atherosclerotic heart disease of native coronary artery without angina pectoris: Secondary | ICD-10-CM | POA: Insufficient documentation

## 2017-07-20 DIAGNOSIS — N200 Calculus of kidney: Secondary | ICD-10-CM | POA: Insufficient documentation

## 2017-07-20 DIAGNOSIS — D869 Sarcoidosis, unspecified: Secondary | ICD-10-CM | POA: Diagnosis not present

## 2017-07-28 ENCOUNTER — Other Ambulatory Visit: Payer: Self-pay | Admitting: Specialist

## 2017-07-28 DIAGNOSIS — R0609 Other forms of dyspnea: Principal | ICD-10-CM

## 2017-07-28 DIAGNOSIS — D860 Sarcoidosis of lung: Secondary | ICD-10-CM

## 2017-07-28 DIAGNOSIS — R0600 Dyspnea, unspecified: Secondary | ICD-10-CM

## 2017-07-28 DIAGNOSIS — J439 Emphysema, unspecified: Secondary | ICD-10-CM

## 2017-07-28 DIAGNOSIS — B0229 Other postherpetic nervous system involvement: Secondary | ICD-10-CM

## 2017-08-08 ENCOUNTER — Ambulatory Visit
Admission: RE | Admit: 2017-08-08 | Discharge: 2017-08-08 | Disposition: A | Discharge status: Home / Self Care | Payer: Medicare Other | Source: Ambulatory Visit | Attending: Specialist | Admitting: Specialist

## 2017-08-08 DIAGNOSIS — B0229 Other postherpetic nervous system involvement: Secondary | ICD-10-CM | POA: Insufficient documentation

## 2017-08-08 DIAGNOSIS — J439 Emphysema, unspecified: Secondary | ICD-10-CM | POA: Diagnosis not present

## 2017-08-08 DIAGNOSIS — R0609 Other forms of dyspnea: Secondary | ICD-10-CM | POA: Diagnosis present

## 2017-08-08 DIAGNOSIS — D860 Sarcoidosis of lung: Secondary | ICD-10-CM | POA: Diagnosis present

## 2017-08-08 DIAGNOSIS — R0600 Dyspnea, unspecified: Secondary | ICD-10-CM

## 2017-08-17 ENCOUNTER — Other Ambulatory Visit: Payer: Self-pay | Admitting: Specialist

## 2017-08-17 DIAGNOSIS — R0689 Other abnormalities of breathing: Secondary | ICD-10-CM

## 2017-08-18 ENCOUNTER — Ambulatory Visit: Payer: Medicare Other | Attending: Specialist

## 2017-08-18 DIAGNOSIS — R0689 Other abnormalities of breathing: Secondary | ICD-10-CM | POA: Insufficient documentation

## 2017-08-18 LAB — BLOOD GAS, ARTERIAL
Acid-Base Excess: 14.8 mmol/L — ABNORMAL HIGH (ref 0.0–2.0)
Bicarbonate: 41.4 mmol/L — ABNORMAL HIGH (ref 20.0–28.0)
FIO2: 0.21
O2 Saturation: 91.1 %
Patient temperature: 37
pCO2 arterial: 61 mmHg — ABNORMAL HIGH (ref 32.0–48.0)
pH, Arterial: 7.44 (ref 7.350–7.450)
pO2, Arterial: 59 mmHg — ABNORMAL LOW (ref 83.0–108.0)

## 2017-08-19 ENCOUNTER — Other Ambulatory Visit: Payer: Self-pay | Admitting: Specialist

## 2017-08-19 DIAGNOSIS — E869 Volume depletion, unspecified: Secondary | ICD-10-CM

## 2017-08-19 DIAGNOSIS — R0689 Other abnormalities of breathing: Secondary | ICD-10-CM

## 2017-09-15 ENCOUNTER — Telehealth: Payer: Self-pay | Admitting: *Deleted

## 2017-09-15 NOTE — Telephone Encounter (Signed)
Spoke with patient and she states that she doesn't have an Rx for Gabapentin.  I told her that it would have been escribed and had she checked with her pharmacy.  She states that she asked them to fill it but she never received it.  I told her I would call and check with pharmacy and she states [facility] is [facility].  When I called there they did not have an Rx from 06/21/17   Rechecked the Rx and it was sent and received by [facility].    Autumn [name] states patient signed and picked up Gabapentin on 08/26/17 @ 1738.  And there are 2 refills left.   Called patient back to give her this information, she stated she was just wondering [facility] didn't [facility].  I did ask if she was out of medication yet since she picked up on 08/26/17 and that would have been for a 30 day supply.  She states she is not out of medication.    Reminded patient that if she would like to have [place] escribe Rx's to [facility] she would need to remind [place] and we will change her drug store at her next visit.  Patient verbalizes u/o information.

## 2017-10-06 ENCOUNTER — Ambulatory Visit
Admission: RE | Admit: 2017-10-06 | Discharge: 2017-10-06 | Disposition: A | Discharge status: Home / Self Care | Payer: Medicare Other | Source: Ambulatory Visit | Attending: Nurse Practitioner | Admitting: Nurse Practitioner

## 2017-10-06 ENCOUNTER — Other Ambulatory Visit: Payer: Self-pay | Admitting: Nurse Practitioner

## 2017-10-06 ENCOUNTER — Encounter: Payer: Medicare Other | Attending: Nurse Practitioner | Admitting: Nurse Practitioner

## 2017-10-06 DIAGNOSIS — Z8543 Personal history of malignant neoplasm of ovary: Secondary | ICD-10-CM | POA: Insufficient documentation

## 2017-10-06 DIAGNOSIS — X58XXXA Exposure to other specified factors, initial encounter: Secondary | ICD-10-CM | POA: Diagnosis not present

## 2017-10-06 DIAGNOSIS — I10 Essential (primary) hypertension: Secondary | ICD-10-CM | POA: Insufficient documentation

## 2017-10-06 DIAGNOSIS — H409 Unspecified glaucoma: Secondary | ICD-10-CM | POA: Insufficient documentation

## 2017-10-06 DIAGNOSIS — J449 Chronic obstructive pulmonary disease, unspecified: Secondary | ICD-10-CM | POA: Insufficient documentation

## 2017-10-06 DIAGNOSIS — Z87891 Personal history of nicotine dependence: Secondary | ICD-10-CM | POA: Diagnosis not present

## 2017-10-06 DIAGNOSIS — L97312 Non-pressure chronic ulcer of right ankle with fat layer exposed: Secondary | ICD-10-CM | POA: Insufficient documentation

## 2017-10-06 DIAGNOSIS — S81801A Unspecified open wound, right lower leg, initial encounter: Secondary | ICD-10-CM

## 2017-10-06 DIAGNOSIS — D860 Sarcoidosis of lung: Secondary | ICD-10-CM | POA: Insufficient documentation

## 2017-10-08 NOTE — Progress Notes (Signed)
Autumn [name], Autumn [name] ([id number]) Visit Report for 10/06/2017 Abuse/Suicide Risk Screen Details Patient Name: [name], [name] Date of Service: 10/06/2017 9:45 AM Medical Record Number: [id number] Patient Account Number: [id number] Date of Birth/Sex: Nov 07, 1945 (72 y.o. F) Treating RN: Montey Hora Primary Care Agostino Gorin: FITZGERALD, DAVID Other Clinician: Referring Kyel Purk: Referral, Self Treating Makyia Erxleben/Extender: Cathie Olden in Treatment: 0 Abuse/Suicide Risk Screen Items Answer ABUSE/SUICIDE RISK SCREEN: Has anyone close to you tried to hurt or harm you recentlyo No Do you feel uncomfortable with anyone in your familyo No Has anyone forced you do things that you didnot want to doo No Do you have any thoughts of harming yourselfo No Patient displays signs or symptoms of abuse and/or neglect. No Electronic Signature(s) Signed: 10/06/2017 4:38:21 PM By: Montey Hora Entered By: Montey Hora on 10/06/2017 10:06:29 Autumn Hua I. ([id number]) -------------------------------------------------------------------------------- Activities of Daily Living Details Patient Name: Autumn Gathers I. Date of Service: 10/06/2017 9:45 AM Medical Record Number: [id number] Patient Account Number: [id number] Date of Birth/Sex: [birth year] (72 y.o. F) Treating RN: Montey Hora Primary Care Cohl Behrens: FITZGERALD, DAVID Other Clinician: Referring Olivianna Higley: Referral, Self Treating Marchell Froman/Extender: Cathie Olden in Treatment: 0 Activities of Daily Living Items Answer Activities of Daily Living (Please select one for each item) Drive Automobile Not Able Take Medications Completely Able Use Telephone Completely Able Care for Appearance Completely Able Use Toilet Completely Able Bath / Shower Completely Able Dress Self Completely Able Feed Self Completely Able Walk Completely Able Get In / Out Bed Completely Able Housework Need Assistance Prepare Meals Need Assistance Handle Money  Completely Able Shop for Self Need Assistance Electronic Signature(s) Signed: 10/06/2017 4:38:21 PM By: Montey Hora Entered By: Montey Hora on 10/06/2017 10:06:55 Autumn Gathers I. ([id number]) -------------------------------------------------------------------------------- Education Assessment Details Patient Name: Autumn Gathers I. Date of Service: 10/06/2017 9:45 AM Medical Record Number: [id number] Patient Account Number: [id number] Date of Birth/Sex: 01-25-1946 (72 y.o. F) Treating RN: Montey Hora Primary Care Jaramiah Bossard: [facility], DAVID Other Clinician: Referring Aeralyn Barna: Referral, Self Treating Tanecia Mccay/Extender: Cathie Olden in Treatment: 0 Primary Learner Assessed: Patient Learning Preferences/Education Level/Primary Language Learning Preference: Explanation, Demonstration Highest Education Level: College or Above Preferred Language: English Cognitive Barrier Assessment/Beliefs Language Barrier: No Translator Needed: No Memory Deficit: No Emotional Barrier: No Cultural/Religious Beliefs Affecting Medical Care: No Physical Barrier Assessment Impaired Vision: No Impaired Hearing: No Decreased Hand dexterity: No Knowledge/Comprehension Assessment Knowledge Level: Medium Comprehension Level: Medium Ability to understand written Medium instructions: Ability to understand verbal Medium instructions: Motivation Assessment Anxiety Level: Calm Cooperation: Cooperative Education Importance: Acknowledges Need Interest in Health Problems: Asks Questions Perception: Coherent Willingness to Engage in Self- Medium Management Activities: Readiness to Engage in Self- Medium Management Activities: Electronic Signature(s) Signed: 10/06/2017 4:38:21 PM By: Montey Hora Entered By: Montey Hora on 10/06/2017 10:07:22 Autumn Gathers I. ([id number]) -------------------------------------------------------------------------------- Fall Risk Assessment  Details Patient Name: Autumn Gathers I. Date of Service: 10/06/2017 9:45 AM Medical Record Number: [id number] Patient Account Number: [id number] Date of Birth/Sex: 07-30-1945 (72 y.o. F) Treating RN: Montey Hora Primary Care Bradie Lacock: FITZGERALD, DAVID Other Clinician: Referring Jamerius Boeckman: Referral, Self Treating Shellye Zandi/Extender: Cathie Olden in Treatment: 0 Fall Risk Assessment Items Have you had 2 or more falls in the last 12 monthso 0 No Have you had any fall that resulted in injury in the last 12 monthso 0 No FALL RISK ASSESSMENT: History of falling - immediate or within 3 months 0 No Secondary diagnosis 0 No Ambulatory aid None/bed rest/wheelchair/nurse 0 No Crutches/cane/walker 15 Yes Furniture 0 No IV Access/Saline  Lock 0 No Gait/Training Normal/bed rest/immobile 0 No Weak 10 Yes Impaired 0 No Mental Status Oriented to own ability 0 Yes Electronic Signature(s) Signed: 10/06/2017 4:38:21 PM By: Montey Hora Entered By: Montey Hora on 10/06/2017 10:08:13 Autumn Gathers I. ([id number]) -------------------------------------------------------------------------------- Foot Assessment Details Patient Name: Autumn Gathers I. Date of Service: 10/06/2017 9:45 AM Medical Record Number: [id number] Patient Account Number: [id number] Date of Birth/Sex: 07/28/1945 (72 y.o. F) Treating RN: Montey Hora Primary Care Tayah Idrovo: FITZGERALD, DAVID Other Clinician: Referring Markevion Lattin: Referral, Self Treating Alontae Chaloux/Extender: Cathie Olden in Treatment: 0 Foot Assessment Items Site Locations + = Sensation present, - = Sensation absent, C = Callus, U = Ulcer R = Redness, W = Warmth, M = Maceration, PU = Pre-ulcerative lesion F = Fissure, S = Swelling, D = Dryness Assessment Right: Left: Other Deformity: No No Prior Foot Ulcer: No No Prior Amputation: No No Charcot Joint: No No Ambulatory Status: Ambulatory With Help Assistance Device: Cane Gait: Steady Electronic  Signature(s) Signed: 10/06/2017 4:38:21 PM By: Montey Hora Entered By: Montey Hora on 10/06/2017 10:08:51 Autumn Gathers I. ([id number]) -------------------------------------------------------------------------------- Nutrition Risk Assessment Details Patient Name: Autumn Gathers I. Date of Service: 10/06/2017 9:45 AM Medical Record Number: [id number] Patient Account Number: [id number] Date of Birth/Sex: 1945-12-16 (72 y.o. F) Treating RN: Montey Hora Primary Care Sally-Ann Cutbirth: FITZGERALD, DAVID Other Clinician: Referring Shuntae Herzig: Referral, Self Treating Arash Karstens/Extender: Cathie Olden in Treatment: 0 Height (in): 68 Weight (lbs): 121 Body Mass Index (BMI): 18.4 Nutrition Risk Assessment Items NUTRITION RISK SCREEN: I have an illness or condition that made me change the kind and/or amount of 0 No food I eat I eat fewer than two meals per day 0 No I eat few fruits and vegetables, or milk products 0 No I have three or more drinks of beer, liquor or wine almost every day 0 No I have tooth or mouth problems that make it hard for me to eat 0 No I don't always have enough money to buy the food I need 0 No I eat alone most of the time 0 No I take three or more different prescribed or over-the-counter drugs a day 1 Yes Without wanting to, I have lost or gained 10 pounds in the last six months 2 Yes I am not always physically able to shop, cook and/or feed myself 0 No Nutrition Protocols Good Risk Protocol Provide education on Moderate Risk Protocol 0 nutrition Electronic Signature(s) Signed: 10/06/2017 4:38:21 PM By: Montey Hora Entered By: Montey Hora on 10/06/2017 10:08:30

## 2017-10-11 NOTE — Progress Notes (Signed)
HERMENIA, FRITCHER ([id number]) Visit Report for 10/06/2017 Allergy List Details Patient Name: Autumn [name], Autumn [name]. Date of Service: 10/06/2017 9:45 AM Medical Record Number: [id number] Patient Account Number: [id number] Date of Birth/Sex: [birth year] (72 y.o. F) Treating RN: Montey Hora Primary Care Kindsey Eblin: FITZGERALD, DAVID Other Clinician: Referring Gadge Hermiz: Referral, Self Treating Perle Brickhouse/Extender: [name] Weeks in Treatment: 0 Allergies Active Allergies Iodinated Contrast- Oral and IV Dye metrizamide Allergy Notes Electronic Signature(s) Signed: 10/10/2017 5:11:46 PM By: [name] Entered By: [name] on 10/06/2017 11:01:22 CARTEROnalee Hua [name]. ([id number]) -------------------------------------------------------------------------------- Arrival Information Details Patient Name: Autumn [name]. Date of Service: 10/06/2017 9:45 AM Medical Record Number: [id number] Patient Account Number: [id number] Date of Birth/Sex: 1945/07/09 (72 y.o. F) Treating RN: Montey Hora Primary Care Arabia Nylund: FITZGERALD, DAVID Other Clinician: Referring Kyreese Chio: Referral, Self Treating Yadir Zentner/Extender: Cathie Olden in Treatment: 0 Visit Information Patient Arrived: Ambulatory Arrival Time: 10:02 Accompanied By: spouse Transfer Assistance: None Patient Identification Verified: Yes Secondary Verification Process Yes Completed: Patient Has Alerts: Yes Patient Alerts: ABI [place] BILATERAL >220 Electronic Signature(s) Signed: 10/06/2017 4:38:21 PM By: Montey Hora Entered By: Montey Hora on 10/06/2017 10:30:32 Autumn [name]. ([id number]) -------------------------------------------------------------------------------- Clinic Level of Care Assessment Details Patient Name: Autumn [name]. Date of Service: 10/06/2017 9:45 AM Medical Record Number: [id number] Patient Account Number: [id number] Date of Birth/Sex: 24-Jul-1945 (71 y.o. F) Treating RN: Ahmed Prima Primary  Care Shanin Szymanowski: FITZGERALD, DAVID Other Clinician: Referring Marqual Mi: Referral, Self Treating Riti Rollyson/Extender: Cathie Olden in Treatment: 0 Clinic Level of Care Assessment Items TOOL 1 Quantity Score X - Use when EandM and Procedure is performed on INITIAL visit 1 0 ASSESSMENTS - Nursing Assessment / Reassessment X - General Physical Exam (combine w/ comprehensive assessment (listed just below) when 1 20 performed on new pt. evals) X- 1 25 Comprehensive Assessment (HX, ROS, Risk Assessments, Wounds Hx, etc.) ASSESSMENTS - Wound and Skin Assessment / Reassessment []  - Dermatologic / Skin Assessment (not related to wound area) 0 ASSESSMENTS - Ostomy and/or Continence Assessment and Care []  - Incontinence Assessment and Management 0 []  - 0 Ostomy Care Assessment and Management (repouching, etc.) PROCESS - Coordination of Care X - Simple Patient / Family Education for ongoing care 1 15 []  - 0 Complex (extensive) Patient / Family Education for ongoing care []  - 0 Staff obtains Programmer, systems, Records, Test Results / Process Orders []  - 0 Staff telephones HHA, Nursing Homes / Clarify orders / etc []  - 0 Routine Transfer to another Facility (non-emergent condition) []  - 0 Routine Hospital Admission (non-emergent condition) X- 1 15 New Admissions / Biomedical engineer / Ordering NPWT, Apligraf, etc. []  - 0 Emergency Hospital Admission (emergent condition) PROCESS - Special Needs []  - Pediatric / Minor Patient Management 0 []  - 0 Isolation Patient Management []  - 0 Hearing / Language / Visual special needs []  - 0 Assessment of Community assistance (transportation, D/C planning, etc.) []  - 0 Additional assistance / Altered mentation []  - 0 Support Surface(s) Assessment (bed, cushion, seat, etc.) Buell, Anahli [name]. ([id number]) INTERVENTIONS - Miscellaneous []  - External ear exam 0 []  - 0 Patient Transfer (multiple staff / Civil Service fast streamer / Similar devices) []  - 0 Simple  Staple / Suture removal (25 or less) []  - 0 Complex Staple / Suture removal (26 or more) []  - 0 Hypo/Hyperglycemic Management (do not check if billed separately) X- 1 15 Ankle / Brachial Index (ABI) - do not check if billed separately Has the patient been seen at the hospital within the last three years:  Yes Total Score: 90 Level Of Care: New/Established - Level 3 Electronic Signature(s) Signed: 10/10/2017 5:11:46 PM By: [name] Entered By: [name] on 10/06/2017 12:35:38 Autumn [name]. ([id number]) -------------------------------------------------------------------------------- Encounter Discharge Information Details Patient Name: Autumn [name]. Date of Service: 10/06/2017 9:45 AM Medical Record Number: [id number] Patient Account Number: [id number] Date of Birth/Sex: 07/10/1945 (72 y.o. F) Treating RN: Montey Hora Primary Care Raphaela Cannaday: FITZGERALD, DAVID Other Clinician: Referring Donaven Criswell: Referral, Self Treating Ravin Denardo/Extender: Cathie Olden in Treatment: 0 Encounter Discharge Information Items Discharge Condition: Stable Ambulatory Status: Cane Discharge Destination: Home Transportation: Private Auto Accompanied By: spouse Schedule Follow-up Appointment: Yes Clinical Summary of Care: Electronic Signature(s) Signed: 10/06/2017 12:03:33 PM By: Montey Hora Entered By: Montey Hora on 10/06/2017 12:03:32 Autumn [name]. ([id number]) -------------------------------------------------------------------------------- Lower Extremity Assessment Details Patient Name: Autumn [name]. Date of Service: 10/06/2017 9:45 AM Medical Record Number: [id number] Patient Account Number: [id number] Date of Birth/Sex: Dec 06, 1945 (72 y.o. F) Treating RN: Montey Hora Primary Care Brookelynn Hamor: FITZGERALD, DAVID Other Clinician: Referring Laretta Pyatt: Referral, Self Treating Anil Havard/Extender: Cathie Olden in Treatment: 0 Edema Assessment Assessed: [Left: No]  [Right: No] Edema: [Left: No] [Right: No] Vascular Assessment Pulses: Dorsalis Pedis Palpable: [Left:Yes] [Right:Yes] Doppler Audible: [Left:Yes] [Right:Yes] Posterior Tibial Palpable: [Left:Yes] [Right:Yes] Doppler Audible: [Left:Yes] [Right:Yes] Extremity colors, hair growth, and conditions: Extremity Color: [Left:Normal] [Right:Normal] Hair Growth on Extremity: [Left:No] [Right:No] Temperature of Extremity: [Left:Warm] [Right:Warm] Capillary Refill: [Left:< 3 seconds] [Right:< 3 seconds] Toe Nail Assessment Left: Right: Thick: Yes Yes Discolored: No No Deformed: No No Improper Length and Hygiene: No No Notes ABI [place] BILATERAL >220 Electronic Signature(s) Signed: 10/06/2017 4:38:21 PM By: Montey Hora Entered By: Montey Hora on 10/06/2017 10:30:11 Autumn [name]. ([id number]) -------------------------------------------------------------------------------- Multi Wound Chart Details Patient Name: Autumn [name]. Date of Service: 10/06/2017 9:45 AM Medical Record Number: [id number] Patient Account Number: [id number] Date of Birth/Sex: [birth year] (72 y.o. F) Treating RN: Ahmed Prima Primary Care Lindie Roberson: FITZGERALD, DAVID Other Clinician: Referring Felita Bump: Referral, Self Treating Shan Padgett/Extender: Cathie Olden in Treatment: 0 Vital Signs Height(in): 68 Pulse(bpm): 88 Weight(lbs): 121 Blood Pressure(mmHg): 174/79 Body Mass Index(BMI): 18 Temperature(F): 98.3 Respiratory Rate 16 (breaths/min): Photos: [N/A:N/A] Wound Location: Right Malleolus - Lateral N/A N/A Wounding Event: Gradually Appeared N/A N/A Primary Etiology: To be determined N/A N/A Comorbid History: Glaucoma, Chronic N/A N/A Obstructive Pulmonary Disease (COPD), Hypertension, Received Chemotherapy Date Acquired: 04/12/2017 N/A N/A Weeks of Treatment: 0 N/A N/A Wound Status: Open N/A N/A Measurements L x W x D 0.2x0.2x0.2 N/A N/A (cm) Area (cm) : 0.031 N/A N/A Volume (cm) :  0.006 N/A N/A Starting Position 1 1 (o'clock): Ending Position 1 1 (o'clock): Maximum Distance 1 (cm): 0.2 Undermining: Yes N/A N/A Classification: Full Thickness Without N/A N/A Exposed Support Structures Exudate Amount: Medium N/A N/A Exudate Type: Serous N/A N/A Exudate Color: amber N/A N/A Wound Margin: Flat and Intact N/A N/A Granulation Amount: Medium (34-66%) N/A N/A Granulation Quality: Pink N/A N/A Rayburn, Loyal [name]. ([id number]) Necrotic Amount: Medium (34-66%) N/A N/A Exposed Structures: Fat Layer (Subcutaneous N/A N/A Tissue) Exposed: Yes Fascia: No Tendon: No Muscle: No Joint: No Bone: No Epithelialization: None N/A N/A Debridement: Debridement - Excisional N/A N/A Pre-procedure 10:52 N/A N/A Verification/Time Out Taken: Pain Control: Lidocaine 4% Topical Solution N/A N/A Tissue Debrided: Subcutaneous, Slough N/A N/A Level: Skin/Subcutaneous Tissue N/A N/A Debridement Area (sq cm): 0.04 N/A N/A Instrument: Blade N/A N/A Bleeding: Minimum N/A N/A Hemostasis Achieved: Pressure N/A N/A Procedural Pain: 0 N/A N/A Post Procedural Pain: 0  N/A N/A Debridement Treatment Procedure was tolerated well N/A N/A Response: Post Debridement 0.2x0.2x0.3 N/A N/A Measurements L x W x D (cm) Post Debridement Volume: 0.009 N/A N/A (cm) Periwound Skin Texture: Excoriation: No N/A N/A Induration: No Callus: No Crepitus: No Rash: No Scarring: No Periwound Skin Moisture: Maceration: No N/A N/A Dry/Scaly: No Periwound Skin Color: Ecchymosis: Yes N/A N/A Atrophie Blanche: No Cyanosis: No Erythema: No Hemosiderin Staining: No Mottled: No Pallor: No Rubor: No Temperature: No Abnormality N/A N/A Tenderness on Palpation: Yes N/A N/A Wound Preparation: Ulcer Cleansing: N/A N/A Rinsed/Irrigated with Saline Topical Anesthetic Applied: Other: lidocaine 4% Procedures Performed: Debridement N/A N/A Treatment Notes Wound #1 (Right, Lateral Malleolus) 1. Cleansed  with: Clean wound with Normal Saline Dettmann, Raelie [name]. ([id number]) 2. Anesthetic Topical Lidocaine 4% cream to wound bed prior to debridement 3. Peri-wound Care: Skin Prep 4. Dressing Applied: Other dressing (specify in notes) 5. Secondary Dressing Applied [place] Notes antibiotic ointment Electronic Signature(s) Signed: 10/06/2017 8:34:50 PM By: [name] Previous Signature: 10/06/2017 2:09:09 PM Version By: [name] Entered By: [name] on 10/06/2017 20:34:50 Autumn [name]. ([id number]) -------------------------------------------------------------------------------- [place] Details Patient Name: Autumn [name]. Date of Service: 10/06/2017 9:45 AM Medical Record Number: [id number] Patient Account Number: [id number] Date of Birth/Sex: 1945/11/06 (72 y.o. F) Treating RN: Ahmed Prima Primary Care Jaion Lagrange: FITZGERALD, DAVID Other Clinician: Referring Ranelle Auker: Referral, Self Treating Karessa Onorato/Extender: Cathie Olden in Treatment: 0 Active Inactive ` Abuse / Safety / Falls / Self Care Management Nursing Diagnoses: Potential for falls Goals: Patient will not experience any injury related to falls Date Initiated: 10/06/2017 Target Resolution Date: 01/14/2018 Goal Status: Active Interventions: Assess Activities of Daily Living upon admission and as needed Assess fall risk on admission and as needed Assess: immobility, friction, shearing, incontinence upon admission and as needed Assess impairment of mobility on admission and as needed per policy Assess personal safety and home safety (as indicated) on admission and as needed Notes: ` Nutrition Nursing Diagnoses: Imbalanced nutrition Potential for alteratiion in Nutrition/Potential for imbalanced nutrition Goals: Patient/caregiver agrees to and verbalizes understanding of need to use nutritional supplements and/or vitamins as prescribed Date Initiated: 10/06/2017 Target  Resolution Date: 01/14/2018 Goal Status: Active Interventions: Assess patient nutrition upon admission and as needed per policy Notes: ` Orientation to the Wound Care Program Nursing Diagnoses: Knowledge deficit related to the wound healing center program SHALINI, MAIR [name]. ([id number]) Goals: Patient/caregiver will verbalize understanding of the [place] Date Initiated: 10/06/2017 Target Resolution Date: 10/15/2017 Goal Status: Active Interventions: Provide education on orientation to the wound center Notes: ` Pain, Acute or Chronic Nursing Diagnoses: Pain, acute or chronic: actual or potential Potential alteration in comfort, pain Goals: Patient/caregiver will verbalize adequate pain control between visits Date Initiated: 10/06/2017 Target Resolution Date: 01/14/2018 Goal Status: Active Interventions: Complete pain assessment as per visit requirements Encourage patient to take pain medications as prescribed Notes: ` Wound/Skin Impairment Nursing Diagnoses: Impaired tissue integrity Knowledge deficit related to ulceration/compromised skin integrity Goals: Ulcer/skin breakdown will have a volume reduction of 80% by week 12 Date Initiated: 10/06/2017 Target Resolution Date: 01/07/2018 Goal Status: Active Interventions: Assess patient/caregiver ability to perform ulcer/skin care regimen upon admission and as needed Assess ulceration(s) every visit Notes: Electronic Signature(s) Signed: 10/10/2017 5:11:46 PM By: [name] Entered By: [name] on 10/06/2017 10:52:20 Schneeberger, Onalee Hua [name]. ([id number]) -------------------------------------------------------------------------------- Pain Assessment Details Patient Name: Autumn [name]. Date of Service: 10/06/2017 9:45 AM Medical Record Number: [id number] Patient Account Number:  [id number] Date of Birth/Sex: 02/02/1946 (72 y.o. F) Treating RN: Montey Hora Primary Care Ibrahima Holberg: FITZGERALD, DAVID Other  Clinician: Referring Pallie Swigert: Referral, Self Treating Olufemi Mofield/Extender: Cathie Olden in Treatment: 0 Active Problems Location of Pain Severity and Description of Pain Patient Has Paino Yes Site Locations Pain Location: Pain in Ulcers With Dressing Change: Yes Duration of the Pain. Constant / Intermittento Intermittent Pain Management and Medication Current Pain Management: Electronic Signature(s) Signed: 10/06/2017 4:38:21 PM By: Montey Hora Entered By: Montey Hora on 10/06/2017 10:05:29 Autumn [name]. ([id number]) -------------------------------------------------------------------------------- Patient/Caregiver Education Details Patient Name: Autumn [name]. Date of Service: 10/06/2017 9:45 AM Medical Record Number: [id number] Patient Account Number: [id number] Date of Birth/Gender: 09-08-1945 (72 y.o. F) Treating RN: Montey Hora Primary Care Physician: Ola Spurr, DAVID Other Clinician: Referring Physician: Referral, Self Treating Physician/Extender: Cathie Olden in Treatment: 0 Education Assessment Education Provided To: Patient and Caregiver Education Topics Provided Wound/Skin Impairment: Handouts: Other: wound care as ordered Methods: Demonstration, Explain/Verbal Responses: State content correctly Electronic Signature(s) Signed: 10/06/2017 4:38:21 PM By: Montey Hora Entered By: Montey Hora on 10/06/2017 12:03:49 Mckeever, Onalee Hua [name]. ([id number]) -------------------------------------------------------------------------------- Wound Assessment Details Patient Name: Autumn [name]. Date of Service: 10/06/2017 9:45 AM Medical Record Number: [id number] Patient Account Number: [id number] Date of Birth/Sex: 08-02-1945 (72 y.o. F) Treating RN: Montey Hora Primary Care Adilyn Humes: FITZGERALD, DAVID Other Clinician: Referring Elienai Gailey: Referral, Self Treating Armonte Tortorella/Extender: Cathie Olden in Treatment: 0 Wound Status Wound Number: 1  Primary To be determined Etiology: Wound Location: Right Malleolus - Lateral Wound Open Wounding Event: Gradually Appeared Status: Date Acquired: 04/12/2017 Comorbid Glaucoma, Chronic Obstructive Pulmonary Weeks Of Treatment: 0 History: Disease (COPD), Hypertension, Received Clustered Wound: No Chemotherapy Photos Photo Uploaded By: Montey Hora on 10/06/2017 10:37:35 Wound Measurements Length: (cm) 0.2 Width: (cm) 0.2 Depth: (cm) 0.2 Area: (cm) 0.031 Volume: (cm) 0.006 % Reduction in Area: % Reduction in Volume: Epithelialization: None Tunneling: No Undermining: Yes Starting Position (o'clock): 1 Ending Position (o'clock): 1 Maximum Distance: (cm) 0.2 Wound Description Full Thickness Without Exposed Support Classification: Structures Wound Margin: Flat and Intact Exudate Medium Amount: Exudate Type: Serous Exudate Color: amber Foul Odor After Cleansing: No Slough/Fibrino Yes Wound Bed Granulation Amount: Medium (34-66%) Exposed Structure Granulation Quality: Pink Fascia Exposed: No Necrotic Amount: Medium (34-66%) Fat Layer (Subcutaneous Tissue) Exposed: Yes Fulginiti, Tenika [name]. ([id number]) Necrotic Quality: Adherent Slough Tendon Exposed: No Muscle Exposed: No Joint Exposed: No Bone Exposed: No Periwound Skin Texture Texture Color No Abnormalities Noted: No No Abnormalities Noted: No Callus: No Atrophie Blanche: No Crepitus: No Cyanosis: No Excoriation: No Ecchymosis: Yes Induration: No Erythema: No Rash: No Hemosiderin Staining: No Scarring: No Mottled: No Pallor: No Moisture Rubor: No No Abnormalities Noted: No Dry / Scaly: No Temperature / Pain Maceration: No Temperature: No Abnormality Tenderness on Palpation: Yes Wound Preparation Ulcer Cleansing: Rinsed/Irrigated with Saline Topical Anesthetic Applied: Other: lidocaine 4%, Treatment Notes Wound #1 (Right, Lateral Malleolus) 1. Cleansed with: Clean wound with Normal Saline 2.  Anesthetic Topical Lidocaine 4% cream to wound bed prior to debridement 3. Peri-wound Care: Skin Prep 4. Dressing Applied: Other dressing (specify in notes) 5. Secondary Dressing Applied [place] Notes antibiotic ointment Electronic Signature(s) Signed: 10/06/2017 4:38:21 PM By: Montey Hora Entered By: Montey Hora on 10/06/2017 10:22:25 Autumn [name]. ([id number]) -------------------------------------------------------------------------------- [place] Details Patient Name: Autumn [name]. Date of Service: 10/06/2017 9:45 AM Medical Record Number: [id number] Patient Account Number: [id number] Date of Birth/Sex: Sep 01, 1945 (72 y.o. F) Treating RN: Montey Hora Primary Care Adylee Leonardo: FITZGERALD,  DAVID Other Clinician: Referring Jaskaran Dauzat: Referral, Self Treating Eual Lindstrom/Extender: Cathie Olden in Treatment: 0 Vital Signs Time Taken: 10:05 Temperature (F): 98.3 Height (in): 68 Pulse (bpm): 88 Source: Measured Respiratory Rate (breaths/min): 16 Weight (lbs): 121 Blood Pressure (mmHg): 174/79 Source: Measured Reference Range: 80 - 120 mg / dl Body Mass Index (BMI): 18.4 Electronic Signature(s) Signed: 10/06/2017 4:38:21 PM By: Montey Hora Entered By: Montey Hora on 10/06/2017 10:06:01

## 2017-10-11 NOTE — Progress Notes (Signed)
NEKAYLA, HEIDER ([id number]) Visit Report for 10/06/2017 Chief Complaint Document Details Patient Name: Autumn [name], Autumn I. Date of Service: 10/06/2017 9:45 AM Medical Record Number: [id number] Patient Account Number: [id number] Date of Birth/Sex: 07-14-1945 (72 y.o. F) Treating RN: Ahmed Prima Primary Care Provider: FITZGERALD, DAVID Other Clinician: Referring Provider: Referral, Self Treating Provider/Extender: Cathie Olden in Treatment: 0 Information Obtained from: Patient Chief Complaint chronic right lateral malleolus ulcer Electronic Signature(s) Signed: 10/06/2017 8:37:42 PM By: [name] Entered By: [name] on 10/06/2017 20:37:42 Autumn Gathers I. ([id number]) -------------------------------------------------------------------------------- Debridement Details Patient Name: Autumn Gathers I. Date of Service: 10/06/2017 9:45 AM Medical Record Number: [id number] Patient Account Number: [id number] Date of Birth/Sex: [birth year] (72 y.o. F) Treating RN: Ahmed Prima Primary Care Provider: FITZGERALD, DAVID Other Clinician: Referring Provider: Referral, Self Treating Provider/Extender: Cathie Olden in Treatment: 0 Debridement Performed for Wound #1 Right,Lateral Malleolus Assessment: Performed By: Physician [name], NP Debridement Type: Debridement Pre-procedure Verification/Time Yes - 10:52 Out Taken: Start Time: 10:52 Pain Control: Lidocaine 4% Topical Solution Total Area Debrided (L x W): 0.2 (cm) x 0.2 (cm) = 0.04 (cm) Tissue and other material Non-Viable, Skin: Dermis , Fibrin/Exudate debrided: Level: Skin/Dermis Debridement Description: Selective/Open Wound Instrument: Blade Bleeding: Minimum Hemostasis Achieved: Pressure End Time: 10:59 Procedural Pain: 0 Post Procedural Pain: 0 Response to Treatment: Procedure was tolerated well Level of Consciousness: Awake and Alert Post Procedure Vitals: Temperature: 98.3 Pulse:  88 Respiratory Rate: 16 Blood Pressure: Systolic Blood Pressure: 735 Diastolic Blood Pressure: 79 Post Debridement Measurements of Total Wound Length: (cm) 0.2 Width: (cm) 0.2 Depth: (cm) 0.3 Volume: (cm) 0.009 Character of Wound/Ulcer Post Debridement: Requires Further Debridement Post Procedure Diagnosis Same as Pre-procedure Electronic Signature(s) Signed: 10/06/2017 8:35:12 PM By: [name] Signed: 10/10/2017 5:11:46 PM By: [name] Entered By: [name] on 10/06/2017 20:35:11 Autumn Gathers I. ([id number]) -------------------------------------------------------------------------------- HPI Details Patient Name: Autumn Gathers I. Date of Service: 10/06/2017 9:45 AM Medical Record Number: [id number] Patient Account Number: [id number] Date of Birth/Sex: 1945-12-09 (72 y.o. F) Treating RN: Ahmed Prima Primary Care Provider: [facility], DAVID Other Clinician: Referring Provider: Referral, Self Treating Provider/Extender: Cathie Olden in Treatment: 0 History of Present Illness HPI Description: 10/06/17-She is here for initial evaluation of a chronic right lateral malleolus ulcer. She provides a vague history regarding this ulcer. The ulcer has been present for greater than 6 months, although she cannot specify anything more than that. She denies any known trauma or precipitating event other than scratching. She admits to seeing dermatology (does not remember who; there are no records in epic system) several months ago at which time they removed a scab to reveal the current ulceration; she says at one time they considered performing a biopsy but did not perform because it was "getting better". According to her and her husband the ulcer is essentially unchanged, has never healed, never been worse. She reports pain only with squeezing pressure, otherwise denies pain. She reports minimal to no drainage; has been applying antibiotic ointment/cream. She carries a  history of pulmonary sarcoidosis; non-diabetic; non-smoker; chronic immunosuppression with prednisone Electronic Signature(s) Signed: 10/06/2017 9:25:09 PM By: [name] Entered By: [name] on 10/06/2017 21:25:09 Autumn Gathers I. ([id number]) -------------------------------------------------------------------------------- Physical Exam Details Patient Name: Autumn Gathers I. Date of Service: 10/06/2017 9:45 AM Medical Record Number: [id number] Patient Account Number: [id number] Date of Birth/Sex: [birth year] (72 y.o. F) Treating RN: Ahmed Prima Primary Care Provider: FITZGERALD, DAVID Other Clinician: Referring Provider: Referral, Self Treating Provider/Extender: Cathie Olden in Treatment: 0  Respiratory respirations are even and unlabored. clear/diminished throughout. Cardiovascular RLE- palpable DP, PT. RLE- no edema, warm to touch. Psychiatric . oriented x4. Electronic Signature(s) Signed: 10/06/2017 9:26:34 PM By: [name] Entered By: [name] on 10/06/2017 21:26:34 Autumn Gathers IMarland Kitchen ([id number]) -------------------------------------------------------------------------------- Physician Orders Details Patient Name: Autumn Gathers I. Date of Service: 10/06/2017 9:45 AM Medical Record Number: [id number] Patient Account Number: [id number] Date of Birth/Sex: 12/10/1945 (72 y.o. F) Treating RN: Ahmed Prima Primary Care Provider: FITZGERALD, DAVID Other Clinician: Referring Provider: Referral, Self Treating Provider/Extender: Cathie Olden in Treatment: 0 Verbal / Phone Orders: Yes Clinician: Pinkerton, Debi Read Back and Verified: Yes Diagnosis Coding Wound Cleansing Wound #1 Right,Lateral Malleolus o Clean wound with Normal Saline. o Cleanse wound with mild soap and water o May Shower, gently pat wound dry prior to applying new dressing. Anesthetic (add to Medication List) Wound #1 Right,Lateral Malleolus o Topical Lidocaine 4% cream  applied to wound bed prior to debridement (In Clinic Only). Primary Wound Dressing Wound #1 Right,Lateral Malleolus o Other: - antibiotic ointment Secondary Dressing Wound #1 [place] Dressing Change Frequency Wound #1 Right,Lateral Malleolus o Change dressing every day. Follow-up Appointments Wound #1 Right,Lateral Malleolus o Return Appointment in 2 weeks. Off-Loading Wound #1 Right,Lateral Malleolus o Other: - keep pressure off of affected area Additional Orders / Instructions Wound #1 Right,Lateral Malleolus o Increase protein intake. Oxygen Administration Wound #1 Right,Lateral Malleolus o While patient is in clinic, provide supplimental oxygen via nasal cannula at liters/min __ : - 2 LPM via [facility] [place], [place]. ([id number]) o X-ray, ankle Patient Medications Allergies: Iodinated Contrast- Oral and IV Dye, metrizamide Notifications Medication Indication Start End lidocaine DOSE 1 - topical 4 % cream - 1 cream topical Electronic Signature(s) Signed: 10/06/2017 9:32:27 PM By: [name] Signed: 10/10/2017 5:11:46 PM By: [name] Entered By: [name] on 10/06/2017 11:07:10 Autumn Gathers I. ([id number]) -------------------------------------------------------------------------------- Prescription 10/06/2017 Patient Name: [name] Provider: [name] NP Date of Birth: October 23, 1945 NPI#: [id number] Sex: F DEA#: [id number] Phone #: [phone] License #: Patient Address: [place] [address] [facility] [place], [place] [address] [address], [place], [place] [address] [phone] Allergies Iodinated Contrast- Oral and IV Dye metrizamide Medication Medication: Route: Strength: Form: lidocaine topical 4% cream Class: TOPICAL LOCAL ANESTHETICS Dose: Frequency / Time: Indication: 1 1 cream  topical Number of Refills: Number of Units: 0 Generic Substitution: Start Date: End Date: Administered at Substitution Permitted Facility: Yes Time Administered: Time Discontinued: Note to Pharmacy: Signature(s): Date(s): Electronic Signature(s) Signed: 10/06/2017 9:32:27 PM By: [name] Signed: 10/10/2017 5:11:46 PM By: Penni Homans I. ([id number]) Entered By: [name] on 10/06/2017 11:07:11 Mankin, [name] ([id number]) --------------------------------------------------------------------------------  Problem List Details Patient Name: Autumn Gathers I. Date of Service: 10/06/2017 9:45 AM Medical Record Number: [id number] Patient Account Number: [id number] Date of Birth/Sex: [birth year] (72 y.o. 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 ([id number]) -------------------------------------------------------------------------------- Progress Note Details Patient  Name: Poupard, Rhylin I. Date of Service: 10/06/2017 9:45 AM Medical Record Number: [id number] Patient Account Number: [id number] Date of Birth/Sex: 10/16/1945 (72 y.o. F) Treating RN: Ahmed Prima Primary Care Provider: [facility], DAVID Other Clinician: Referring Provider: Referral, Self Treating Provider/Extender: Cathie Olden in Treatment: 0 Subjective Chief  Complaint Information obtained from Patient chronic right lateral malleolus ulcer History of Present Illness (HPI) 10/06/17-She is here for initial evaluation of a chronic right lateral malleolus ulcer. She provides a vague history regarding this ulcer. The ulcer has been present for greater than 6 months, although she cannot specify anything more than that. She denies any known trauma or precipitating event other than scratching. She admits to seeing dermatology (does not remember who; there are no records in epic system) several months ago at which time they removed a scab to reveal the current ulceration; she says at one time they considered performing a biopsy but did not perform because it was "getting better". According to her and her husband the ulcer is essentially unchanged, has never healed, never been worse. She reports pain only with squeezing pressure, otherwise denies pain. She reports minimal to no drainage; has been applying antibiotic ointment/cream. She carries a history of pulmonary sarcoidosis; non-diabetic; non-smoker; chronic immunosuppression with prednisone Wound History Patient presents with 1 open wound that has been present for approximately 3 or 4 months. Patient has been treating wound in the following manner: mupirocin. Laboratory tests have been performed in the last month. Patient reportedly has not tested positive for an antibiotic resistant organism. Patient reportedly has not tested positive for osteomyelitis. Patient reportedly has not had testing performed to evaluate circulation in the legs. Patient History Information obtained from Patient. Allergies Iodinated Contrast- Oral and IV Dye, metrizamide Family History Cancer - Mother, No family history of Diabetes, Heart Disease, Hereditary Spherocytosis, Hypertension, Kidney Disease, Lung Disease, Seizures, Stroke, Thyroid Problems, Tuberculosis. Social History Former smoker - 20 years, Marital Status -  Married, Alcohol Use - Rarely, Drug Use - No History, Caffeine Use - Rarely. Medical History Eyes Patient has history of Glaucoma Denies history of Cataracts, Optic Neuritis Ear/Nose/Mouth/Throat Denies history of Chronic sinus problems/congestion, Middle ear problems Hematologic/Lymphatic Denies history of Anemia, Hemophilia, Human Immunodeficiency Virus, Lymphedema, Sickle Cell Disease Respiratory Higbie, Cherron I. ([id number]) Patient has history of Chronic Obstructive Pulmonary Disease (COPD) Denies history of Aspiration, Asthma, Pneumothorax, Sleep Apnea, Tuberculosis Cardiovascular Patient has history of Hypertension Denies history of Angina, Arrhythmia, Congestive Heart Failure, Coronary Artery Disease, Deep Vein Thrombosis, Hypotension, Myocardial Infarction, Peripheral Arterial Disease, Peripheral Venous Disease, Phlebitis, Vasculitis Gastrointestinal Denies history of Cirrhosis , Colitis, Crohn s, Hepatitis A, Hepatitis B, Hepatitis C Endocrine Denies history of Type I Diabetes, Type II Diabetes Genitourinary Denies history of End Stage Renal Disease Immunological Denies history of Lupus Erythematosus, Raynaud s, Scleroderma Integumentary (Skin) Denies history of History of Burn, History of pressure wounds Musculoskeletal Denies history of Gout, Rheumatoid Arthritis, Osteoarthritis, Osteomyelitis Neurologic Denies history of Dementia, Neuropathy, Quadriplegia, Paraplegia, Seizure Disorder Oncologic Patient has history of Received Chemotherapy - 2011 Denies history of Received Radiation Psychiatric Denies history of Anorexia/bulimia, Confinement Anxiety Medical And Surgical History Notes Respiratory sarcoidosis Oncologic ovarian cancer - ovaries removed Review of Systems (ROS) Constitutional Symptoms (General Health) The patient has no complaints or symptoms. Eyes Complains or has symptoms of Glasses / Contacts - glasses. Denies complaints or symptoms of Dry Eyes,  Vision Changes. Ear/Nose/Mouth/Throat Denies complaints or symptoms of Difficult clearing ears, Sinusitis. Hematologic/Lymphatic Denies complaints or symptoms of Bleeding /  Clotting Disorders, Human Immunodeficiency Virus. Respiratory Denies complaints or symptoms of Chronic or frequent coughs, Shortness of Breath. Cardiovascular Denies complaints or symptoms of Chest pain, LE edema. Gastrointestinal Denies complaints or symptoms of Frequent diarrhea, Nausea, Vomiting. Endocrine Denies complaints or symptoms of Hepatitis, Thyroid disease, Polydypsia (Excessive Thirst). Genitourinary Denies complaints or symptoms of Kidney failure/ Dialysis, Incontinence/dribbling. Immunological Denies complaints or symptoms of Hives, Itching. Integumentary (Skin) Complains or has symptoms of Wounds. Denies complaints or symptoms of Bleeding or bruising tendency, Breakdown, Swelling. Musculoskeletal Denies complaints or symptoms of Muscle Pain, Muscle Weakness. Neurologic Sweetin, De I. ([id number]) Denies complaints or symptoms of Numbness/parasthesias, Focal/Weakness. Psychiatric Denies complaints or symptoms of Anxiety, Claustrophobia. Objective Constitutional Vitals Time Taken: 10:05 AM, Height: 68 in, Source: Measured, Weight: 121 lbs, Source: Measured, BMI: 18.4, Temperature: 98.3 F, Pulse: 88 bpm, Respiratory Rate: 16 breaths/min, Blood Pressure: 174/79 mmHg. Respiratory respirations are even and unlabored. clear/diminished throughout. Cardiovascular RLE- palpable DP, PT. RLE- no edema, warm to touch. Psychiatric oriented x4. Integumentary (Hair, Skin) Wound #1 status is Open. Original cause of wound was Gradually Appeared. The wound is located on the Right,Lateral Malleolus. The wound measures 0.2cm length x 0.2cm width x 0.2cm depth; 0.031cm^2 area and 0.006cm^3 volume. There is Fat Layer (Subcutaneous Tissue) Exposed exposed. There is no tunneling noted, however, there is  undermining starting at 1:00 and ending at 1:00 with a maximum distance of 0.2cm. There is a medium amount of serous drainage noted. The wound margin is flat and intact. There is medium (34-66%) pink granulation within the wound bed. There is a medium (34-66%) amount of necrotic tissue within the wound bed including Adherent Slough. The periwound skin appearance exhibited: Ecchymosis. The periwound skin appearance did not exhibit: Callus, Crepitus, Excoriation, Induration, Rash, Scarring, Dry/Scaly, Maceration, Atrophie Blanche, Cyanosis, Hemosiderin Staining, Mottled, Pallor, Rubor, Erythema. Periwound temperature was noted as No Abnormality. The periwound has tenderness on palpation. 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 ([id number]) Wound #1 Pre-procedure diagnosis of Wound #1 is a To be determined located on the Right,Lateral Malleolus . There was a Selective/Open Wound Skin/Dermis Debridement with a total area of 0.04 sq cm performed by [name], NP. With the following instrument(s): Blade to remove Non-Viable tissue/material. Material removed includes Skin: Dermis and Fibrin/Exudate and after achieving pain control using Lidocaine 4% Topical Solution. No specimens were taken. A time out was conducted at 10:52, prior to the start of the procedure. A Minimum amount of bleeding was controlled with Pressure. The procedure was tolerated well with a pain level of 0 throughout and a pain level of 0 following the procedure. Patient s Level of Consciousness post procedure was recorded as Awake and Alert. Post Debridement Measurements: 0.2cm length x 0.2cm width x 0.3cm depth; 0.009cm^3 volume. Character of Wound/Ulcer Post Debridement requires further debridement. Post procedure Diagnosis Wound #1: Same as Pre-Procedure Plan Wound Cleansing: Wound #1 Right,Lateral Malleolus: Clean  wound with Normal Saline. Cleanse wound with mild soap and water May Shower, gently pat wound dry prior to applying new dressing. Anesthetic (add to Medication List): Wound #1 Right,Lateral Malleolus: Topical Lidocaine 4% cream applied to wound bed prior to debridement (In Clinic Only). Primary Wound Dressing: Wound #1 Right,Lateral Malleolus: Other: - antibiotic ointment Secondary Dressing: Wound #1 Right,Lateral Malleolus: Foam [place] Dressing Change Frequency: Wound #1 Right,Lateral Malleolus: Change dressing every day. Follow-up Appointments: Wound #1 Right,Lateral Malleolus: Return Appointment in 2 weeks. Off-Loading:  Wound #1 Right,Lateral Malleolus: Other: - keep pressure off of affected area Additional Orders / Instructions: Wound #1 Right,Lateral Malleolus: Increase protein intake. Oxygen Administration: Wound #1 Right,Lateral Malleolus: While patient is in clinic, provide supplimental oxygen via nasal cannula at liters/min __ : - 2 LPM via [facility] ordered were: X-ray, ankle The following medication(s) was prescribed: lidocaine topical 4 % cream 1 1 cream topical was prescribed at facility [facility] ([id number]) Electronic Signature(s) Signed: 10/06/2017 9:26:49 PM By: [name] Entered By: [name] on 10/06/2017 21:26:49 Autumn Gathers I. ([id number]) -------------------------------------------------------------------------------- ROS/PFSH Details Patient Name: Autumn Gathers I. Date of Service: 10/06/2017 9:45 AM Medical Record Number: [id number] Patient Account Number: [id number] Date of Birth/Sex: Aug 20, 1945 (72 y.o. F) Treating RN: Montey Hora Primary Care Provider: FITZGERALD, DAVID Other Clinician: Referring Provider: Referral, Self Treating Provider/Extender: Cathie Olden in Treatment: 0 Information Obtained From Patient Wound History Do you currently have one or more open woundso Yes How many open wounds do you  currently haveo 1 Approximately how long have you had your woundso 3 or 4 months How have you been treating your wound(s) until nowo mupirocin Has your wound(s) ever healed and then re-openedo No Have you had any lab work done in the past montho Yes Who ordered the lab work doneo PCP Have you tested positive for an antibiotic resistant organism (MRSA, VRE)o No Have you tested positive for osteomyelitis (bone infection)o No Have you had any tests for circulation on your legso No Constitutional Symptoms (General Health) Complaints and Symptoms: No Complaints or Symptoms Complaints and Symptoms: Negative for: Fatigue; Fever; Chills; Marked Weight Change Eyes Complaints and Symptoms: Positive for: Glasses / Contacts - glasses Negative for: Dry Eyes; Vision Changes Medical History: Positive for: Glaucoma Negative for: Cataracts; Optic Neuritis Ear/Nose/Mouth/Throat Complaints and Symptoms: Negative for: Difficult clearing ears; Sinusitis Medical History: Negative for: Chronic sinus problems/congestion; Middle ear problems Hematologic/Lymphatic Complaints and Symptoms: Negative for: Bleeding / Clotting Disorders; Human Immunodeficiency Virus Medical History: Negative for: Anemia; Hemophilia; Human Immunodeficiency Virus; Lymphedema; Sickle Cell Disease Respiratory Busbee, Jillann I. ([id number]) Complaints and Symptoms: Negative for: Chronic or frequent coughs; Shortness of Breath Medical History: Positive for: Chronic Obstructive Pulmonary Disease (COPD) Negative for: Aspiration; Asthma; Pneumothorax; Sleep Apnea; Tuberculosis Past Medical History Notes: sarcoidosis Cardiovascular Complaints and Symptoms: Negative for: Chest pain; LE edema Medical History: Positive for: Hypertension Negative for: Angina; Arrhythmia; Congestive Heart Failure; Coronary Artery Disease; Deep Vein Thrombosis; Hypotension; Myocardial Infarction; Peripheral Arterial Disease; Peripheral Venous Disease;  Phlebitis; Vasculitis Gastrointestinal Complaints and Symptoms: Negative for: Frequent diarrhea; Nausea; Vomiting Medical History: Negative for: Cirrhosis ; Colitis; Crohnos; Hepatitis A; Hepatitis B; Hepatitis C Endocrine Complaints and Symptoms: Negative for: Hepatitis; Thyroid disease; Polydypsia (Excessive Thirst) Medical History: Negative for: Type I Diabetes; Type II Diabetes Genitourinary Complaints and Symptoms: Negative for: Kidney failure/ Dialysis; Incontinence/dribbling Medical History: Negative for: End Stage Renal Disease Immunological Complaints and Symptoms: Negative for: Hives; Itching Medical History: Negative for: Lupus Erythematosus; Raynaudos; Scleroderma Integumentary (Skin) Complaints and Symptoms: Positive for: Wounds Negative for: Bleeding or bruising tendency; Breakdown; Swelling Medical History: Negative for: History of Burn; History of pressure wounds Yurick, Anokhi I. ([id number]) Musculoskeletal Complaints and Symptoms: Negative for: Muscle Pain; Muscle Weakness Medical History: Negative for: Gout; Rheumatoid Arthritis; Osteoarthritis; Osteomyelitis Neurologic Complaints and Symptoms: Negative for: Numbness/parasthesias; Focal/Weakness Medical History: Negative for: Dementia; Neuropathy; Quadriplegia; Paraplegia; Seizure Disorder Psychiatric Complaints and Symptoms: Negative for: Anxiety; Claustrophobia Medical History: Negative for: Anorexia/bulimia; Confinement Anxiety Oncologic Medical History: Positive for: Received Chemotherapy -  2011 Negative for: Received Radiation Past Medical History Notes: ovarian cancer - ovaries removed HBO Extended History Items Eyes: Glaucoma Immunizations Pneumococcal Vaccine: Received Pneumococcal Vaccination: Yes Immunization Notes: up to date Implantable Devices Family and Social History Cancer: Yes - Mother; Diabetes: No; Heart Disease: No; Hereditary Spherocytosis: No; Hypertension: No; Kidney  Disease: No; Lung Disease: No; Seizures: No; Stroke: No; Thyroid Problems: No; Tuberculosis: No; Former smoker - 36 years; Marital Status - Married; Alcohol Use: Rarely; Drug Use: No History; Caffeine Use: Rarely; Financial Concerns: No; Food, Clothing or Shelter Needs: No; Support System Lacking: No; Transportation Concerns: No; Advanced Directives: No; Patient does not want information on Advanced Directives Electronic Signature(s) Signed: 10/06/2017 4:38:21 PM By: Montey Hora Signed: 10/06/2017 9:32:27 PM By: [name] Entered By: Montey Hora on 10/06/2017 10:14:29 Tigges, [name] ([id number]) -------------------------------------------------------------------------------- [place] Details Patient Name: Autumn Gathers I. Date of Service: 10/06/2017 Medical Record Number: [id number] Patient Account Number: [id number] Date of Birth/Sex: [birth year] (72 y.o. 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

## 2017-10-20 ENCOUNTER — Encounter: Payer: Medicare Other | Attending: Nurse Practitioner | Admitting: Nurse Practitioner

## 2017-10-20 ENCOUNTER — Encounter: Payer: Self-pay | Admitting: Student in an Organized Health Care Education/Training Program

## 2017-10-20 ENCOUNTER — Ambulatory Visit
Payer: Medicare Other | Attending: Student in an Organized Health Care Education/Training Program | Admitting: Student in an Organized Health Care Education/Training Program

## 2017-10-20 ENCOUNTER — Other Ambulatory Visit: Payer: Self-pay

## 2017-10-20 VITALS — BP 141/59 | HR 84 | Temp 98.5°F | Resp 18 | Ht 68.0 in | Wt 121.0 lb

## 2017-10-20 DIAGNOSIS — Z888 Allergy status to other drugs, medicaments and biological substances status: Secondary | ICD-10-CM | POA: Insufficient documentation

## 2017-10-20 DIAGNOSIS — Z760 Encounter for issue of repeat prescription: Secondary | ICD-10-CM | POA: Insufficient documentation

## 2017-10-20 DIAGNOSIS — Z7952 Long term (current) use of systemic steroids: Secondary | ICD-10-CM | POA: Diagnosis not present

## 2017-10-20 DIAGNOSIS — J449 Chronic obstructive pulmonary disease, unspecified: Secondary | ICD-10-CM | POA: Diagnosis not present

## 2017-10-20 DIAGNOSIS — D860 Sarcoidosis of lung: Secondary | ICD-10-CM | POA: Insufficient documentation

## 2017-10-20 DIAGNOSIS — Z79891 Long term (current) use of opiate analgesic: Secondary | ICD-10-CM | POA: Insufficient documentation

## 2017-10-20 DIAGNOSIS — I10 Essential (primary) hypertension: Secondary | ICD-10-CM | POA: Insufficient documentation

## 2017-10-20 DIAGNOSIS — Z8543 Personal history of malignant neoplasm of ovary: Secondary | ICD-10-CM | POA: Diagnosis not present

## 2017-10-20 DIAGNOSIS — L97312 Non-pressure chronic ulcer of right ankle with fat layer exposed: Secondary | ICD-10-CM | POA: Diagnosis present

## 2017-10-20 DIAGNOSIS — M810 Age-related osteoporosis without current pathological fracture: Secondary | ICD-10-CM | POA: Diagnosis not present

## 2017-10-20 DIAGNOSIS — B0229 Other postherpetic nervous system involvement: Secondary | ICD-10-CM | POA: Diagnosis not present

## 2017-10-20 DIAGNOSIS — Z79899 Other long term (current) drug therapy: Secondary | ICD-10-CM | POA: Diagnosis not present

## 2017-10-20 DIAGNOSIS — I272 Pulmonary hypertension, unspecified: Secondary | ICD-10-CM | POA: Diagnosis not present

## 2017-10-20 DIAGNOSIS — M792 Neuralgia and neuritis, unspecified: Secondary | ICD-10-CM

## 2017-10-20 DIAGNOSIS — Z9221 Personal history of antineoplastic chemotherapy: Secondary | ICD-10-CM | POA: Insufficient documentation

## 2017-10-20 DIAGNOSIS — G629 Polyneuropathy, unspecified: Secondary | ICD-10-CM | POA: Diagnosis not present

## 2017-10-20 DIAGNOSIS — Z91041 Radiographic dye allergy status: Secondary | ICD-10-CM | POA: Insufficient documentation

## 2017-10-20 DIAGNOSIS — G894 Chronic pain syndrome: Secondary | ICD-10-CM | POA: Diagnosis not present

## 2017-10-20 DIAGNOSIS — M5414 Radiculopathy, thoracic region: Secondary | ICD-10-CM | POA: Insufficient documentation

## 2017-10-20 DIAGNOSIS — Z5181 Encounter for therapeutic drug level monitoring: Secondary | ICD-10-CM | POA: Insufficient documentation

## 2017-10-20 MED ORDER — GABAPENTIN 300 MG PO CAPS: ORAL_CAPSULE | ORAL | 6 refills | Status: DC

## 2017-10-20 MED ORDER — BUPRENORPHINE HCL 450 MCG BU FILM: 450.0000 ug | ORAL_FILM | Freq: Two times a day (BID) | BUCCAL | 1 refills | Status: DC

## 2017-10-20 MED ORDER — TIZANIDINE HCL 4 MG PO TABS: 4.0000 mg | ORAL_TABLET | Freq: Two times a day (BID) | ORAL | 1 refills | Status: DC | PRN

## 2017-10-20 NOTE — Progress Notes (Signed)
Patient's Name: Autumn [name]  MRN: [id number]  Referring Provider: Leonel Ramsay, MD  DOB: 22-Aug-1945  PCP: Leonel Ramsay, MD  DOS: 10/20/2017  Note by: Gillis Santa, MD  Service setting: Ambulatory outpatient  Specialty: Interventional Pain Management  Location: [facility] (AMB) Pain Management Facility    Patient type: Established   Primary Reason(s) for Visit: Encounter for prescription drug management. (Level of risk: moderate)  CC: Medication Refill  HPI  Autumn [name] is a 72 y.o. year old, female patient, who comes today for a medication management evaluation. She has Pneumonia; COPD (chronic obstructive pulmonary disease) ([place]); Long term current use of systemic steroids; Post herpetic neuralgia; Pulmonary hypertension (HCC); and Pulmonary sarcoidosis (HCC) on their problem list. Her primarily concern today is the Medication Refill  Pain Assessment: Location: Mid, Left Back Radiating: Pain radiates across from mid-left back to mid-left chest Onset: More than a month ago Duration: Chronic pain Quality: Constant Severity: 7 /10 (subjective, self-reported pain score)  Note: Reported level is inconsistent with clinical observations.                         When using our objective Pain Scale, levels between 6 and 10/10 are said to belong in an emergency room, as it progressively worsens from a 6/10, described as severely limiting, requiring emergency care not usually available at an outpatient pain management facility. At a 6/10 level, communication becomes difficult and requires great effort. Assistance to reach the emergency department may be required. Facial flushing and profuse sweating along with potentially dangerous increases in heart rate and blood pressure will be evident. Effect on ADL: "Keeps me from wanting to do anything and not much of an appetite"  Timing: Constant Modifying factors: Tylenol BP: (!) 141/59  HR: 84  Autumn [name] was last scheduled for an appointment on  06/21/2017 for medication management. During today's appointment we reviewed Autumn [name]'s chronic pain status, as well as her outpatient medication regimen.  Will wean Butrans from 600 mcg twice daily to 450 mcg twice daily.  Patient is experiencing muscle spasms in her mid thoracic spine.  Will trial tizanidine.  Patient can continue gabapentin.  The patient  reports that she does not use drugs. Her body mass index is 18.4 kg/m.  Further details on both, my assessment(s), as well as the proposed treatment plan, please see below.  Controlled Substance Pharmacotherapy Assessment REMS (Risk Evaluation and Mitigation Strategy)  Analgesic: Belbuca 600 mg MME/day: [address] [place], [place]  10/20/2017 11:04 AM  Sign at close encounter Nursing Pain Medication Assessment:  Safety precautions to be maintained throughout the outpatient stay will include: orient to surroundings, keep bed in low position, maintain call bell within reach at all times, provide assistance with transfer out of bed and ambulation.  Medication Inspection Compliance: Autumn [name] did not comply with our request to bring her pills to be counted. She was reminded that bringing the medication bottles, even when empty, is a requirement.  Medication: None brought in. Pill/Patch Count: None available to be counted. Bottle Appearance: No container available. Did not bring bottle(s) to appointment. Filled Date: N/A Last Medication intake:  Yesterday   Patient instructed to please bring medication for count at next visit.  Patient verbalizes u/o information.   Autumn Napoleon, RN  10/20/2017 10:39 AM  Sign at close encounter Safety precautions to be maintained throughout the outpatient stay will include: orient to surroundings, keep bed in low position,  maintain call bell within reach at all times, provide assistance with transfer out of bed and ambulation.    Pharmacokinetics: Liberation and absorption (onset of action):  WNL Distribution (time to peak effect): WNL Metabolism and excretion (duration of action): WNL         Pharmacodynamics: Desired effects: Analgesia: [name] reports >50% benefit. Functional ability: Patient reports that medication allows her to accomplish basic ADLs Clinically meaningful improvement in function (CMIF): Sustained CMIF goals met Perceived effectiveness: Described as relatively effective, allowing for increase in activities of daily living (ADL) Undesirable effects: Side-effects or Adverse reactions: None reported Monitoring: [place] PMP: Online review of the past 22-monthperiod conducted. Compliant with practice rules and regulations Last UDS on record: Summary  Date Value Ref Range Status  03/22/2017 FINAL  Final    Comment:    ==================================================================== TOXASSURE COMP DRUG ANALYSIS,UR ==================================================================== Test                             Result       Flag       Units Drug Present and Declared for Prescription Verification   Buprenorphine                  23           EXPECTED   ng/mg creat   Norbuprenorphine               112          EXPECTED   ng/mg creat    Source of buprenorphine is a scheduled prescription medication.    Norbuprenorphine is an expected metabolite of buprenorphine.   Gabapentin                     PRESENT      EXPECTED   Sertraline                     PRESENT      EXPECTED   Desmethylsertraline            PRESENT      EXPECTED    Desmethylsertraline is an expected metabolite of sertraline.   Atenolol                       PRESENT      EXPECTED Drug Absent but Declared for Prescription Verification   Hydrocodone                    Not Detected UNEXPECTED ng/mg creat   Tramadol                       Not Detected UNEXPECTED ng/mg creat   Acetaminophen                  Not Detected UNEXPECTED    Acetaminophen, as indicated in the declared medication list, is     not always detected even when used as directed.   Ibuprofen                      Not Detected UNEXPECTED    Ibuprofen, as indicated in the declared medication list, is not    always detected even when used as directed.   Diphenhydramine                Not Detected UNEXPECTED ==================================================================== Test  Result    Flag   Units      Ref Range   Creatinine              182              mg/dL      >=20 ==================================================================== Declared Medications:  The flagging and interpretation on this report are based on the  following declared medications.  Unexpected results may arise from  inaccuracies in the declared medications.  **Note: The testing scope of this panel includes these medications:  Atenolol  Buprenorphine  Diphenhydramine  Gabapentin  Hydrocodone (Hydrocodone-Acetaminophen)  Sertraline  Tramadol  **Note: The testing scope of this panel does not include small to  moderate amounts of these reported medications:  Acetaminophen (Hydrocodone-Acetaminophen)  Ibuprofen  **Note: The testing scope of this panel does not include following  reported medications:  Albuterol  Amoxicillin (Augmentin)  Atorvastatin  Azithromycin  Brimonidine Tartrate  Brinzolamide  Calcium (Calcium/Vitamin D3)  Clavulinate (Augmentin)  Fluticasone (Advair)  Latanoprost  Omega-3 Fatty Acids  Ondansetron  Prednisone  Salmeterol (Advair)  Tiotropium  Topical  Vitamin D3 (Calcium/Vitamin D3) ==================================================================== For clinical consultation, please call [phone]. ====================================================================    UDS interpretation: Compliant          Medication Assessment Form: Reviewed. Patient indicates being compliant with therapy Treatment compliance: Compliant Risk Assessment Profile: Aberrant behavior: See  prior evaluations. None observed or detected today Comorbid factors increasing risk of overdose: See prior notes. No additional risks detected today Risk of substance use disorder (SUD): Low Opioid Risk Tool - 10/20/17 1038      Family History of Substance Abuse   Alcohol  Negative    Illegal Drugs  Negative    Rx Drugs  Negative      Personal History of Substance Abuse   Alcohol  Negative    Illegal Drugs  Negative    Rx Drugs  Negative      Age   Age between 2-45 years   No      History of Preadolescent Sexual Abuse   History of Preadolescent Sexual Abuse  Negative or Female      Psychological Disease   Psychological Disease  Negative    Depression  Negative      Total Score   Opioid Risk Tool Scoring  0    Opioid Risk Interpretation  Low Risk      ORT Scoring interpretation table:  Score <3 = Low Risk for SUD  Score between 4-7 = Moderate Risk for SUD  Score >8 = High Risk for Opioid Abuse   Risk Mitigation Strategies:  Patient Counseling: Covered Patient-Prescriber Agreement (PPA): Present and active  Notification to other healthcare providers: Done  Pharmacologic Plan: Therapy adjustment: Will wean Belbuca to 450 mcg twice daily from 600 mcg twice daily.  Patient interested in weaning off of her Belbuca.             Laboratory Chemistry  Inflammation Markers (CRP: Acute Phase) (ESR: Chronic Phase) Lab Results  Component Value Date   ESRSEDRATE 48 (H) 05/25/2011                         Rheumatology Markers No results found for: RF, ANA, LABURIC, URICUR, LYMEIGGIGMAB, LYMEABIGMQN, HLAB27                      Renal Function Markers Lab Results  Component Value Date   BUN 23 (  H) 11/18/2015   CREATININE 0.70 11/18/2015   GFRAA >60 11/18/2015   GFRNONAA >60 11/18/2015                             Hepatic Function Markers Lab Results  Component Value Date   AST 21 03/29/2015   ALT 20 03/29/2015   ALBUMIN 4.4 03/29/2015   ALKPHOS 46 03/29/2015   LIPASE  116 12/15/2013                        Electrolytes Lab Results  Component Value Date   NA 139 11/18/2015   K 3.6 11/18/2015   CL 97 (L) 11/18/2015   CALCIUM 9.7 11/18/2015                        Neuropathy Markers No results found for: VITAMINB12, FOLATE, HGBA1C, HIV                      Bone Pathology Markers No results found for: VD25OH, VD125OH2TOT, MA2633HL4, TG2563SL3, 25OHVITD1, 25OHVITD2, 25OHVITD3, TESTOFREE, TESTOSTERONE                       Coagulation Parameters Lab Results  Component Value Date   INR 1.0 02/17/2013   LABPROT 13.2 02/17/2013   PLT 299 11/20/2015                        Cardiovascular Markers Lab Results  Component Value Date   CKTOTAL 66 02/17/2013   CKMB 0.7 02/17/2013   TROPONINI 0.04 (HH) 11/17/2015   HGB 11.6 (L) 11/20/2015   HCT 34.5 (L) 11/20/2015                         CA Markers No results found for: CEA, CA125, LABCA2                      Note: Lab results reviewed.  Recent Diagnostic Imaging Results  DG Ankle Complete Right CLINICAL DATA:  Nonhealing wound of the right lower extremity.  EXAM: RIGHT ANKLE - COMPLETE 3+ VIEW  COMPARISON:  None.  FINDINGS: There is a soft tissue wound over the tip of the distal fibula. No soft tissue air.  There is no fracture. There is no bone resorption to suggest osteomyelitis.  The ankle mortise is normally spaced and aligned.  IMPRESSION: 1. No evidence of osteomyelitis. 2. No fracture. 3. Lateral right ankle soft tissue wound.  Electronically Signed   By: Lajean Manes M.D.   On: 10/06/2017 19:05  Complexity Note: Imaging results reviewed. Results shared with Ms. Eulas Post, using Layman's terms.                         Meds   Current Outpatient Medications:  .  albuterol (PROVENTIL) (2.5 MG/3ML) 0.083% nebulizer solution, Take 2.5 mg by nebulization every 4 (four) hours as needed. , Disp: , Rfl:  .  atenolol (TENORMIN) 25 MG tablet, Take 1 tablet by mouth 2 (two) times  daily., Disp: , Rfl:  .  atorvastatin (LIPITOR) 10 MG tablet, Take 1 tablet by mouth daily at 6 PM., Disp: , Rfl:  .  Brinzolamide-Brimonidine 1-0.2 % SUSP, Apply 1 drop to eye 3 (three) times daily., Disp: , Rfl:  .  CALCIUM 600+D3 600-800 MG-UNIT TABS, Take 1 tablet by mouth 2 (two) times daily with a meal., Disp: , Rfl: 11 .  denosumab (PROLIA) 60 MG/ML SOLN injection, Inject into the skin., Disp: , Rfl:  .  Fluticasone-Salmeterol (ADVAIR DISKUS) 250-50 MCG/DOSE AEPB, Inhale into the lungs., Disp: , Rfl:  .  gabapentin (NEURONTIN) 300 MG capsule, 1 tablet in AM, 3 tablets PM, and 4 tablets qhs, Disp: 240 capsule, Rfl: 6 .  ibuprofen (ADVIL,MOTRIN) 200 MG tablet, Take 200 mg by mouth every 6 (six) hours as needed., Disp: , Rfl:  .  Ibuprofen-Diphenhydramine HCl (ADVIL PM) 200-25 MG CAPS, Take 1 tablet by mouth at bedtime as needed., Disp: , Rfl:  .  latanoprost (XALATAN) 0.005 % ophthalmic solution, Apply 1 drop to eye at bedtime., Disp: , Rfl:  .  lidocaine-prilocaine (EMLA) cream, Apply 1 application topically 2 (two) times daily as needed., Disp: , Rfl:  .  mirtazapine (REMERON) 7.5 MG tablet, every evening., Disp: , Rfl: 11 .  Omega-3 Fatty Acids (FISH OIL PO), Take 1 capsule by mouth every evening., Disp: , Rfl:  .  predniSONE (DELTASONE) 10 MG tablet, , Disp: , Rfl:  .  predniSONE (DELTASONE) 10 MG tablet, Take 10 mg by mouth daily with breakfast. , Disp: , Rfl:  .  sertraline (ZOLOFT) 25 MG tablet, Take 25 mg by mouth daily., Disp: , Rfl: 11 .  tiotropium (SPIRIVA HANDIHALER) 18 MCG inhalation capsule, Place 1 capsule into inhaler and inhale daily., Disp: , Rfl:  .  Buprenorphine HCl (BELBUCA) 450 MCG FILM, Place 450 mcg inside cheek 2 (two) times daily., Disp: 60 each, Rfl: 1 .  tiZANidine (ZANAFLEX) 4 MG tablet, Take 1 tablet (4 mg total) by mouth 2 (two) times daily as needed for muscle spasms., Disp: 60 tablet, Rfl: 1 .  traMADol (ULTRAM) 50 MG tablet, Take 1 tablet by mouth 2 (two)  times daily., Disp: , Rfl:   ROS  Constitutional: Denies any fever or chills Gastrointestinal: No reported hemesis, hematochezia, vomiting, or acute GI distress Musculoskeletal: Denies any acute onset joint swelling, redness, loss of ROM, or weakness Neurological: No reported episodes of acute onset apraxia, aphasia, dysarthria, agnosia, amnesia, paralysis, loss of coordination, or loss of consciousness  Allergies  Ms. Beighley is allergic to ivp dye [iodinated diagnostic agents].  [place]  Drug: Ms. Meeker  reports that she does not use drugs. Alcohol:  reports that she does not drink alcohol. Tobacco:  reports that she has never smoked. She has never used smokeless tobacco. Medical:  has a past medical history of Allergy, COPD (chronic obstructive pulmonary disease) ([place]), Glaucoma, Hypertension, Osteoporosis, Ovarian cancer ([place]), and Sarcoid. Surgical: Ms. Linehan  has a past surgical history that includes Breast biopsy (Right) and Abdominal hysterectomy. Family: family history includes Breast cancer (age of onset: 92) in her mother; Cancer in her mother; Hypertension in her father.  Constitutional Exam  General appearance: Well nourished, well developed, and well hydrated. In no apparent acute distress Vitals:   10/20/17 1029  BP: (!) 141/59  Pulse: 84  Resp: 18  Temp: 98.5 F (36.9 C)  SpO2: 99%  Weight: 121 lb (54.9 kg)  Height: _0  (1.727 m)   BMI Assessment: Estimated body mass index is 18.4 kg/m as calculated from the following:   Height as of this encounter: _1  (1.727 m).   Weight as of this encounter: 121 lb (54.9 kg).  BMI interpretation table: BMI level Category Range association with higher incidence of  chronic pain  <18 kg/m2 Underweight   18.5-24.9 kg/m2 Ideal body weight   25-29.9 kg/m2 Overweight Increased incidence by 20%  30-34.9 kg/m2 Obese (Class I) Increased incidence by 68%  35-39.9 kg/m2 Severe obesity (Class II) Increased incidence by 136%  >40  kg/m2 Extreme obesity (Class III) Increased incidence by 254%   Patient's current BMI Ideal Body weight  Body mass index is 18.4 kg/m. Ideal body weight: 63.9 kg (140 lb 14 oz)   BMI Readings from Last 4 Encounters:  10/20/17 18.40 kg/m  06/21/17 18.70 kg/m  04/26/17 20.36 kg/m  03/22/17 19.77 kg/m   Wt Readings from Last 4 Encounters:  10/20/17 121 lb (54.9 kg)  06/21/17 123 lb (55.8 kg)  03/22/17 130 lb (59 kg)  11/21/15 139 lb 3.2 oz (63.1 kg)  Psych/Mental status: Alert, oriented x 3 (person, place, & time)       Eyes: PERLA Respiratory: Oxygen-dependent COPD  Cervical Spine Area Exam  Skin & Axial Inspection: No masses, redness, edema, swelling, or associated skin lesions Alignment: Symmetrical Functional ROM: Unrestricted ROM      Stability: No instability detected Muscle Tone/Strength: Functionally intact. No obvious neuro-muscular anomalies detected. Sensory (Neurological): Unimpaired Palpation: No palpable anomalies                     Upper Extremity (UE) Exam    Side: Right upper extremity  Side: Left upper extremity   Skin & Extremity Inspection: Skin color, temperature, and hair growth are WNL. No peripheral edema or cyanosis. No masses, redness, swelling, asymmetry, or associated skin lesions. No contractures.  Skin & Extremity Inspection: Skin color, temperature, and hair growth are WNL. No peripheral edema or cyanosis. No masses, redness, swelling, asymmetry, or associated skin lesions. No contractures.   Functional ROM: Unrestricted ROM          Functional ROM: Unrestricted ROM           Muscle Tone/Strength: Functionally intact. No obvious neuro-muscular anomalies detected.  Muscle Tone/Strength: Functionally intact. No obvious neuro-muscular anomalies detected.   Sensory (Neurological): Unimpaired          Sensory (Neurological): Unimpaired           Palpation: No palpable anomalies              Palpation: No palpable anomalies                Specialized Test(s): Deferred         Specialized Test(s): Deferred           Thoracic Spine Area Exam  Skin & Axial Inspection:No masses, redness, or swelling Alignment:Symmetrical Functional WEX:[id number] ROM Stability:No instability detected Muscle Tone/Strength:Functionally intact. No obvious neuro-muscular anomalies detected. Sensory (Neurological):tenderness of left back and flank region T4-T6 dermatome with allodynia of left thoracic back, at inferior scapular border Muscle strength & Tone:Tender  Lumbar Spine Area Exam  Skin & Axial Inspection:No masses, redness, or swelling Alignment:Symmetrical Functional BOF:[id number] ROM Stability:No instability detected Muscle Tone/Strength:Functionally intact. No obvious neuro-muscular anomalies detected. Sensory (Neurological):Unimpaired Palpation:No palpable anomalies Provocative Tests: Lumbar Hyperextension and rotation test:Positivebilaterally for facet joint pain. Lumbar Lateral bending test:Positive Patrick's Maneuver:evaluation deferred today   Gait & Posture Assessment  Ambulation: Unassisted Gait: Relatively normal for age and body habitus Posture: WNL   Lower Extremity Exam    Side: Right lower extremity  Side: Left lower extremity  Stability: No instability observed          Stability: No instability observed  Skin & Extremity Inspection: Skin color, temperature, and hair growth are WNL. No peripheral edema or cyanosis. No masses, redness, swelling, asymmetry, or associated skin lesions. No contractures.  Skin & Extremity Inspection: Skin color, temperature, and hair growth are WNL. No peripheral edema or cyanosis. No masses, redness, swelling, asymmetry, or associated skin lesions. No contractures.  Functional ROM: Unrestricted ROM                  Functional ROM: Unrestricted ROM                  Muscle Tone/Strength: Functionally intact. No obvious  neuro-muscular anomalies detected.  Muscle Tone/Strength: Functionally intact. No obvious neuro-muscular anomalies detected.  Sensory (Neurological): Unimpaired  Sensory (Neurological): Unimpaired  Palpation: No palpable anomalies  Palpation: No palpable anomalies   Assessment  Primary Diagnosis & Pertinent Problem List: The primary encounter diagnosis was Post herpetic neuralgia. Diagnoses of Thoracic radiculopathy, Chronic pain syndrome, Chronic use of opiate for therapeutic purpose, and Neuropathic pain were also pertinent to this visit.  Status Diagnosis  Controlled Controlled Controlled 1. Post herpetic neuralgia   2. Thoracic radiculopathy   3. Chronic pain syndrome   4. Chronic use of opiate for therapeutic purpose   5. Neuropathic pain      General Recommendations: The pain condition that the patient suffers from is best treated with a multidisciplinary approach that involves an increase in physical activity to prevent de-conditioning and worsening of the pain cycle, as well as psychological counseling (formal and/or informal) to address the co-morbid psychological affects of pain. Treatment will often involve judicious use of pain medications and interventional procedures to decrease the pain, allowing the patient to participate in the physical activity that will ultimately produce long-lasting pain reductions. The goal of the multidisciplinary approach is to return the patient to a higher level of overall function and to restore their ability to perform activities of daily living.  72 year old female with a history of left flank and chest pain secondary to postherpetic neuralgia in 2013 who presents formed refill/chronic pain management. Patient was previously being seen at the [facility]. Her symptoms are managed on Butrans film:Belbuca600 mcg twice daily along with gabapentin 2400 mg daily. She states that the medications allow her to function and improve her  neuropathic pain symptoms. She is status post bilateral T5-T6 transforaminal epidural steroid injection in June 2018 with no pain relief and also status post DRG spinal cord stimulator trial on Aug 16, 2016 without any significant pain relief.  Patient follows up today for medication management.  Patient is interested in weaning her Belbuca.  We will decrease her Belbuca dose from 600 mcg twice daily to 450 mg twice daily.  Patient is not endorsing  muscle pain and swelling in her thoracic region that is tender to palpation.  This is likely secondary to myofascial pain syndrome.  She is also having muscle spasms in this region.  I will prescribe her tizanidine and gabapentin to take as needed as below.   Plan of Care  Pharmacotherapy (Medications Ordered): Meds ordered this encounter  Medications  . gabapentin (NEURONTIN) 300 MG capsule    Sig: 1 tablet in AM, 3 tablets PM, and 4 tablets qhs    Dispense:  240 capsule    Refill:  6  . Buprenorphine HCl (BELBUCA) 450 MCG FILM    Sig: Place 450 mcg inside cheek 2 (two) times daily.    Dispense:  60 each    Refill:  1  . tiZANidine (ZANAFLEX) 4 MG tablet    Sig: Take 1 tablet (4 mg total) by mouth 2 (two) times daily as needed for muscle spasms.    Dispense:  60 tablet    Refill:  1   Future considerations: T5-T6 facet injections   Time Note: Greater than 50% of the 25 minute(s) of face-to-face time spent with Ms. Turnbaugh, was spent in counseling/coordination of care regarding: opioid tolerance, Ms. Pigeon's primary cause of pain, the treatment plan, treatment alternatives, the risks and possible complications of proposed treatment, medication side effects, the appropriate use of her medications, realistic expectations and the medication agreement..  Provider-requested follow-up: Return in about 8 weeks (around 12/15/2017) for Medication Management.  Future Appointments  Date Time Provider [place]  10/20/2017  1:30 PM [name],  NP [facility] None  12/15/2017 10:00 AM Gillis Santa, MD [facility] None    Primary Care Physician: Leonel Ramsay, MD Location: [facility] Note by: Gillis Santa, M.D Date: 10/20/2017; Time: 1:28 PM  Patient Instructions  Belbuca 450 mcg x 2 months given to fill today Gabapentin and zanaflex escribed to [facility] pharmacy

## 2017-10-20 NOTE — Progress Notes (Signed)
Nursing Pain Medication Assessment:  Safety precautions to be maintained throughout the outpatient stay will include: orient to surroundings, keep bed in low position, maintain call bell within reach at all times, provide assistance with transfer out of bed and ambulation.  Medication Inspection Compliance: Autumn [name] did not comply with our request to bring her pills to be counted. She was reminded that bringing the medication bottles, even when empty, is a requirement.  Medication: None brought in. Pill/Patch Count: None available to be counted. Bottle Appearance: No container available. Did not bring bottle(s) to appointment. Filled Date: N/A Last Medication intake:  Yesterday   Patient instructed to please bring medication for count at next visit.  Patient verbalizes u/o information.

## 2017-10-20 NOTE — Patient Instructions (Signed)
Belbuca 450 mcg x 2 months given to fill today Gabapentin and zanaflex escribed to [facility]

## 2017-10-20 NOTE — Progress Notes (Signed)
Safety precautions to be maintained throughout the outpatient stay will include: orient to surroundings, keep bed in low position, maintain call bell within reach at all times, provide assistance with transfer out of bed and ambulation.  

## 2017-10-22 NOTE — Progress Notes (Signed)
Autumn [name], Autumn [name] ([id number]) Visit Report for 10/20/2017 Arrival Information Details Patient Name: Autumn [name] I. Date of Service: 10/20/2017 1:30 PM Medical Record Number: [id number] Patient Account Number: [id number] Date of Birth/Sex: 1945/07/20 (72 y.o. F) Treating RN: Autumn [name] Primary Care Autumn [name]: Autumn [name] Other Clinician: Referring Autumn [name]: Autumn [name] Treating Autumn [name]: Autumn [name] in Treatment: 2 Visit Information History Since Last Visit Added or deleted any medications: No Patient Arrived: Wheel Chair Any new allergies or adverse reactions: No Arrival Time: 13:46 Had a fall or experienced change in No Accompanied By: spouse activities of daily living that may affect Transfer Assistance: None risk of falls: Patient Identification Verified: Yes Signs or symptoms of abuse/neglect since last visito No Secondary Verification Process Yes Hospitalized since last visit: No Completed: Implantable device outside of the clinic excluding No Patient Has Alerts: Yes cellular tissue based products placed in the center Patient Alerts: ABI Zearing BILATERAL since last visit: >220 Has Dressing in Place as Prescribed: Yes Pain Present Now: No Electronic Signature(s) Signed: 10/20/2017 5:06:29 PM By: Autumn [name] Entered By: Autumn [name] on 10/20/2017 13:49:36 Autumn [name], Autumn I. ([id number]) -------------------------------------------------------------------------------- Clinic Level of Care Assessment Details Patient Name: Autumn [name] I. Date of Service: 10/20/2017 1:30 PM Medical Record Number: [id number] Patient Account Number: [id number] Date of Birth/Sex: [birth year] (72 y.o. F) Treating RN: Autumn [name] Primary Care Autumn [name]: Autumn [name] Other Clinician: Referring Ayshia Gramlich: Autumn [name] Treating Imaan Padgett/Extender: Autumn [name] in Treatment: 2 Clinic Level of Care Assessment Items TOOL 4 Quantity Score X - Use when  only an EandM is performed on FOLLOW-UP visit 1 0 ASSESSMENTS - Nursing Assessment / Reassessment X - Reassessment of Co-morbidities (includes updates in patient status) 1 10 X- 1 5 Reassessment of Adherence to Treatment Plan ASSESSMENTS - Wound and Skin Assessment / Reassessment X - Simple Wound Assessment / Reassessment - one wound 1 5 []  - 0 Complex Wound Assessment / Reassessment - multiple wounds []  - 0 Dermatologic / Skin Assessment (not related to wound area) ASSESSMENTS - Focused Assessment []  - Circumferential Edema Measurements - multi extremities 0 []  - 0 Nutritional Assessment / Counseling / Intervention []  - 0 Lower Extremity Assessment (monofilament, tuning fork, pulses) []  - 0 Peripheral Arterial Disease Assessment (using hand held doppler) ASSESSMENTS - Ostomy and/or Continence Assessment and Care []  - Incontinence Assessment and Management 0 []  - 0 Ostomy Care Assessment and Management (repouching, etc.) PROCESS - Coordination of Care X - Simple Patient / Family Education for ongoing care 1 15 []  - 0 Complex (extensive) Patient / Family Education for ongoing care []  - 0 Staff obtains Programmer, systems, Records, Test Results / Process Orders []  - 0 Staff telephones HHA, Nursing Homes / Clarify orders / etc []  - 0 Routine Transfer to another Facility (non-emergent condition) []  - 0 Routine Hospital Admission (non-emergent condition) []  - 0 New Admissions / Biomedical engineer / Ordering NPWT, Apligraf, etc. []  - 0 Emergency Hospital Admission (emergent condition) X- 1 10 Simple Discharge Coordination Autumn [name], Autumn I. ([id number]) []  - 0 Complex (extensive) Discharge Coordination PROCESS - Special Needs []  - Pediatric / Minor Patient Management 0 []  - 0 Isolation Patient Management []  - 0 Hearing / Language / Visual special needs []  - 0 Assessment of Community assistance (transportation, D/C planning, etc.) []  - 0 Additional assistance / Altered  mentation []  - 0 Support Surface(s) Assessment (bed, cushion, seat, etc.) INTERVENTIONS - Wound Cleansing / Measurement X - Simple Wound Cleansing - one wound 1 5 []  - 0 Complex  Wound Cleansing - multiple wounds X- 1 5 Wound Imaging (photographs - any number of wounds) []  - 0 Wound Tracing (instead of photographs) X- 1 5 Simple Wound Measurement - one wound []  - 0 Complex Wound Measurement - multiple wounds INTERVENTIONS - Wound Dressings X - Small Wound Dressing one or multiple wounds 1 10 []  - 0 Medium Wound Dressing one or multiple wounds []  - 0 Large Wound Dressing one or multiple wounds X- 1 5 Application of Medications - topical []  - 0 Application of Medications - injection INTERVENTIONS - Miscellaneous []  - External ear exam 0 []  - 0 Specimen Collection (cultures, biopsies, blood, body fluids, etc.) []  - 0 Specimen(s) / Culture(s) sent or taken to Lab for analysis []  - 0 Patient Transfer (multiple staff / Civil Service fast streamer / Similar devices) []  - 0 Simple Staple / Suture removal (25 or less) []  - 0 Complex Staple / Suture removal (26 or more) []  - 0 Hypo / Hyperglycemic Management (close monitor of Blood Glucose) []  - 0 Ankle / Brachial Index (ABI) - do not check if billed separately X- 1 5 Vital Signs Autumn [name], Autumn I. ([id number]) Has the patient been seen at the hospital within the last three years: Yes Total Score: 80 Level Of Care: New/Established - Level 3 Electronic Signature(s) Signed: 10/21/2017 11:31:29 AM By: [name] Entered By: [name] on 10/20/2017 16:30:12 Autumn [name] I. ([id number]) -------------------------------------------------------------------------------- Encounter Discharge Information Details Patient Name: Autumn [name] I. Date of Service: 10/20/2017 1:30 PM Medical Record Number: [id number] Patient Account Number: [id number] Date of Birth/Sex: October 29, 1945 (72 y.o. F) Treating RN: Roger Shelter Primary Care Andrell Tallman:  Autumn [name] Other Clinician: Referring Zayed Griffie: Autumn [name] Treating Autumn [name]/Extender: Autumn [name] in Treatment: 2 Encounter Discharge Information Items Discharge Condition: Stable Ambulatory Status: Wheelchair Discharge Destination: Home Transportation: Private Auto Schedule Follow-up Appointment: Yes Clinical Summary of Care: Electronic Signature(s) Signed: 10/21/2017 10:58:55 AM By: Roger Shelter Entered By: Roger Shelter on 10/20/2017 14:15:37 Valentine, Autumn Hua I. ([id number]) -------------------------------------------------------------------------------- Lower Extremity Assessment Details Patient Name: Autumn [name] I. Date of Service: 10/20/2017 1:30 PM Medical Record Number: [id number] Patient Account Number: [id number] Date of Birth/Sex: 1945-07-29 (72 y.o. F) Treating RN: Autumn [name] Primary Care Leilene Diprima: Autumn [name] Other Clinician: Referring Letricia Krinsky: Autumn [name] Treating Winter Trefz/Extender: [name] Weeks in Treatment: 2 Vascular Assessment Pulses: Dorsalis Pedis Palpable: [Right:Yes] Posterior Tibial Extremity colors, hair growth, and conditions: Extremity Color: [Right:Normal] Hair Growth on Extremity: [Right:No] Temperature of Extremity: [Right:Warm] Capillary Refill: [Right:< 3 seconds] Toe Nail Assessment Left: Right: Thick: Yes Discolored: No Deformed: No Improper Length and Hygiene: Yes Electronic Signature(s) Signed: 10/20/2017 5:06:29 PM By: Autumn [name] Entered By: Autumn [name] on 10/20/2017 13:53:13 Autumn [name], Autumn I. ([id number]) -------------------------------------------------------------------------------- Multi Wound Chart Details Patient Name: Autumn [name] I. Date of Service: 10/20/2017 1:30 PM Medical Record Number: [id number] Patient Account Number: [id number] Date of Birth/Sex: April 07, 1946 (72 y.o. F) Treating RN: Autumn [name] Primary Care Mahdiya Mossberg: Autumn [name] Other  Clinician: Referring Sargon Scouten: Autumn [name] Treating Jaymari Cromie/Extender: [name] Weeks in Treatment: 2 Vital Signs Height(in): 68 Pulse(bpm): 21 Weight(lbs): 121 Blood Pressure(mmHg): 128/52 Body Mass Index(BMI): 18 Temperature(F): 97.9 Respiratory Rate 18 (breaths/min): Photos: [1:No Photos] [N/A:N/A] Wound Location: [1:Right Malleolus - Lateral] [N/A:N/A] Wounding Event: [1:Gradually Appeared] [N/A:N/A] Primary Etiology: [1:To be determined] [N/A:N/A] Comorbid History: [1:Glaucoma, Chronic Obstructive Pulmonary Disease (COPD), Hypertension, Received Chemotherapy] [N/A:N/A] Date Acquired: [1:04/12/2017] [N/A:N/A] Weeks of Treatment: [1:2] [N/A:N/A] Wound Status: [1:Open] [N/A:N/A] Measurements L x W x D [1:0.2x0.2x0.2] [N/A:N/A] (cm) Area (cm) : [1:0.031] [N/A:N/A] Volume (  cm) : [1:0.006] [N/A:N/A] % Reduction in Area: [1:0.00%] [N/A:N/A] % Reduction in Volume: [1:0.00%] [N/A:N/A] Classification: [1:Full Thickness Without Exposed Support Structures] [N/A:N/A] Exudate Amount: [1:Medium] [N/A:N/A] Exudate Type: [1:Serous] [N/A:N/A] Exudate Color: [1:amber] [N/A:N/A] Wound Margin: [1:Flat and Intact] [N/A:N/A] Granulation Amount: [1:Medium (34-66%)] [N/A:N/A] Granulation Quality: [1:Pink] [N/A:N/A] Necrotic Amount: [1:Medium (34-66%)] [N/A:N/A] Exposed Structures: [1:Fat Layer (Subcutaneous Tissue) Exposed: Yes Fascia: No Tendon: No Muscle: No Joint: No Bone: No] [N/A:N/A] Epithelialization: [1:None] [N/A:N/A] Periwound Skin Texture: [1:Excoriation: No Induration: No] [N/A:N/A] Callus: No Crepitus: No Rash: No Scarring: No Periwound Skin Moisture: Maceration: No N/A N/A Dry/Scaly: No Periwound Skin Color: Ecchymosis: Yes N/A N/A Atrophie Blanche: No Cyanosis: No Erythema: No Hemosiderin Staining: No Mottled: No Pallor: No Rubor: No Temperature: No Abnormality N/A N/A Tenderness on Palpation: Yes N/A N/A Wound Preparation: Ulcer Cleansing: N/A  N/A Rinsed/Irrigated with Saline Topical Anesthetic Applied: Other: lidocaine 4% Treatment Notes Wound #1 (Right, Lateral Malleolus) 1. Cleansed with: Clean wound with Normal Saline 2. Anesthetic Topical Lidocaine 4% cream to wound bed prior to debridement 4. Dressing Applied: Prisma Ag 5. Secondary [place] Signature(s) Signed: 10/20/2017 2:51:35 PM By: [name] Entered By: [name] on 10/20/2017 14:51:34 Autumn [name] I. ([id number]) -------------------------------------------------------------------------------- Multi-Disciplinary Care Plan Details Patient Name: Autumn [name] I. Date of Service: 10/20/2017 1:30 PM Medical Record Number: [id number] Patient Account Number: [id number] Date of Birth/Sex: 09/19/1945 (72 y.o. F) Treating RN: Autumn [name] Primary Care Kaileigh Viswanathan: Autumn [name] Other Clinician: Referring Zeynab Klett: Autumn [name] Treating Marina Boerner/Extender: Autumn [name] in Treatment: 2 Active Inactive ` Abuse / Safety / Falls / Self Care Management Nursing Diagnoses: Potential for falls Goals: Patient will not experience any injury related to falls Date Initiated: 10/06/2017 Target Resolution Date: 01/14/2018 Goal Status: Active Interventions: Assess Activities of Daily Living upon admission and as needed Assess fall risk on admission and as needed Assess: immobility, friction, shearing, incontinence upon admission and as needed Assess impairment of mobility on admission and as needed per policy Assess personal safety and home safety (as indicated) on admission and as needed Notes: ` Nutrition Nursing Diagnoses: Imbalanced nutrition Potential for alteratiion in Nutrition/Potential for imbalanced nutrition Goals: Patient/caregiver agrees to and verbalizes understanding of need to use nutritional supplements and/or vitamins as prescribed Date Initiated: 10/06/2017 Target Resolution Date:  01/14/2018 Goal Status: Active Interventions: Assess patient nutrition upon admission and as needed per policy Notes: ` Orientation to the Wound Care Program Nursing Diagnoses: Knowledge deficit related to the wound healing center program NAZIYA, HEGWOOD I. ([id number]) Goals: Patient/caregiver will verbalize understanding of the [facility] Date Initiated: 10/06/2017 Target Resolution Date: 10/15/2017 Goal Status: Active Interventions: Provide education on orientation to the wound center Notes: ` Pain, Acute or Chronic Nursing Diagnoses: Pain, acute or chronic: actual or potential Potential alteration in comfort, pain Goals: Patient/caregiver will verbalize adequate pain control between visits Date Initiated: 10/06/2017 Target Resolution Date: 01/14/2018 Goal Status: Active Interventions: Complete pain assessment as per visit requirements Encourage patient to take pain medications as prescribed Notes: ` Wound/Skin Impairment Nursing Diagnoses: Impaired tissue integrity Knowledge deficit related to ulceration/compromised skin integrity Goals: Ulcer/skin breakdown will have a volume reduction of 80% by week 12 Date Initiated: 10/06/2017 Target Resolution Date: 01/07/2018 Goal Status: Active Interventions: Assess patient/caregiver ability to perform ulcer/skin care regimen upon admission and as needed Assess ulceration(s) every visit Notes: Electronic Signature(s) Signed: 10/21/2017 11:31:29 AM By: [name] Entered By: [name] on 10/20/2017 13:58:19 Autumn [name], Autumn I. ([id number]) -------------------------------------------------------------------------------- Pain Assessment Details  Patient Name: Autumn [name], Autumn I. Date of Service: 10/20/2017 1:30 PM Medical Record Number: [id number] Patient Account Number: [id number] Date of Birth/Sex: [birth year] (72 y.o. F) Treating RN: Autumn [name] Primary Care Morgin Halls: Autumn [name] Other  Clinician: Referring Valori Hollenkamp: Autumn [name] Treating Cintya Daughety/Extender: [name] Weeks in Treatment: 2 Active Problems Location of Pain Severity and Description of Pain Patient Has Paino No Site Locations Pain Management and Medication Current Pain Management: Electronic Signature(s) Signed: 10/20/2017 5:06:29 PM By: Autumn [name] Entered By: Autumn [name] on 10/20/2017 13:49:43 Autumn [name], Autumn Hua I. ([id number]) -------------------------------------------------------------------------------- Patient/Caregiver Education Details Patient Name: Autumn [name] I. Date of Service: 10/20/2017 1:30 PM Medical Record Number: [id number] Patient Account Number: [id number] Date of Birth/Gender: 12-16-1945 (72 y.o. F) Treating RN: Roger Shelter Primary Care Physician: Autumn [name] Other Clinician: Referring Physician: FITZGERALD, [name] Treating Physician/Extender: Autumn [name] in Treatment: 2 Education Assessment Education Provided To: Patient Education Topics Provided Wound Debridement: Handouts: Wound Debridement Methods: Explain/Verbal Responses: State content correctly Wound/Skin Impairment: Handouts: Caring for Your Ulcer Methods: Explain/Verbal Responses: State content correctly Electronic Signature(s) Signed: 10/21/2017 10:58:55 AM By: Roger Shelter Entered By: Roger Shelter on 10/20/2017 14:15:53 Autumn [name], Autumn I. ([id number]) -------------------------------------------------------------------------------- Wound Assessment Details Patient Name: Autumn [name] I. Date of Service: 10/20/2017 1:30 PM Medical Record Number: [id number] Patient Account Number: [id number] Date of Birth/Sex: 1945/08/29 (72 y.o. F) Treating RN: Autumn [name] Primary Care Jabari Swoveland: Autumn [name] Other Clinician: Referring Jelisha Weed: Autumn [name] Treating Bernadean Saling/Extender: [name] Weeks in Treatment: 2 Wound Status Wound Number: 1 Primary To be  determined Etiology: Wound Location: Right Malleolus - Lateral Wound Open Wounding Event: Gradually Appeared Status: Date Acquired: 04/12/2017 Comorbid Glaucoma, Chronic Obstructive Pulmonary Weeks Of Treatment: 2 History: Disease (COPD), Hypertension, Received Clustered Wound: No Chemotherapy Photos Photo Uploaded By: Autumn [name] on 10/20/2017 16:17:11 Wound Measurements Length: (cm) 0.2 Width: (cm) 0.2 Depth: (cm) 0.2 Area: (cm) 0.031 Volume: (cm) 0.006 % Reduction in Area: 0% % Reduction in Volume: 0% Epithelialization: None Tunneling: No Undermining: No Wound Description Full Thickness Without Exposed Support Classification: Structures Wound Margin: Flat and Intact Exudate Medium Amount: Exudate Type: Serous Exudate Color: amber Foul Odor After Cleansing: No Slough/Fibrino Yes Wound Bed Granulation Amount: Medium (34-66%) Exposed Structure Granulation Quality: Pink Fascia Exposed: No Necrotic Amount: Medium (34-66%) Fat Layer (Subcutaneous Tissue) Exposed: Yes Necrotic Quality: Adherent Slough Tendon Exposed: No Muscle Exposed: No Joint Exposed: No Bone Exposed: No Autumn [name], Autumn I. ([id number]) Periwound Skin Texture Texture Color No Abnormalities Noted: No No Abnormalities Noted: No Callus: No Atrophie Blanche: No Crepitus: No Cyanosis: No Excoriation: No Ecchymosis: Yes Induration: No Erythema: No Rash: No Hemosiderin Staining: No Scarring: No Mottled: No Pallor: No Moisture Rubor: No No Abnormalities Noted: No Dry / Scaly: No Temperature / Pain Maceration: No Temperature: No Abnormality Tenderness on Palpation: Yes Wound Preparation Ulcer Cleansing: Rinsed/Irrigated with Saline Topical Anesthetic Applied: Other: lidocaine 4%, Treatment Notes Wound #1 (Right, Lateral Malleolus) 1. Cleansed with: Clean wound with Normal Saline 2. Anesthetic Topical Lidocaine 4% cream to wound bed prior to debridement 4. Dressing  Applied: Prisma Ag 5. Secondary [place] Signature(s) Signed: 10/20/2017 5:06:29 PM By: Autumn [name] Entered By: Autumn [name] on 10/20/2017 13:52:49 Autumn [name], Autumn Hua I. ([id number]) -------------------------------------------------------------------------------- Vitals Details Patient Name: Autumn [name] I. Date of Service: 10/20/2017 1:30 PM Medical Record Number: [id number] Patient Account Number: [id number] Date of Birth/Sex: 04/24/1945 (72 y.o. F) Treating RN: Autumn [name] Primary Care Dalvin Clipper: Autumn [name] Other Clinician: Referring Kerilyn Cortner: Autumn [name] Treating Tayon Parekh/Extender: [name] Weeks in  Treatment: 2 Vital Signs Time Taken: 13:49 Temperature (F): 97.9 Height (in): 68 Pulse (bpm): 86 Weight (lbs): 121 Respiratory Rate (breaths/min): 18 Body Mass Index (BMI): 18.4 Blood Pressure (mmHg): 128/52 Reference Range: 80 - 120 mg / dl Electronic Signature(s) Signed: 10/20/2017 5:06:29 PM By: Autumn [name] Entered By: Autumn [name] on 10/20/2017 13:50:02

## 2017-10-22 NOTE — Progress Notes (Signed)
ELIZEBETH, KLUESNER ([id number]) Visit Report for 10/20/2017 Chief Complaint Document Details Patient Name: Autumn [name], Autumn [name] I. Date of Service: 10/20/2017 1:30 PM Medical Record Number: [id number] Patient Account Number: [id number] Date of Birth/Sex: 1945/09/26 (72 y.o. F) Treating RN: Ahmed Prima Primary Care Provider: FITZGERALD, DAVID Other Clinician: Referring Provider: FITZGERALD, DAVID Treating Provider/Extender: Cathie Olden in Treatment: 2 Information Obtained from: Patient Chief Complaint chronic right lateral malleolus ulcer Electronic Signature(s) Signed: 10/20/2017 2:54:08 PM By: [name] Entered By: [name] on 10/20/2017 14:54:07 Ciaravino, [name] ([id number]) -------------------------------------------------------------------------------- HPI Details Patient Name: Autumn Gathers I. Date of Service: 10/20/2017 1:30 PM Medical Record Number: [id number] Patient Account Number: [id number] Date of Birth/Sex: 1946/01/25 (72 y.o. F) Treating RN: Ahmed Prima Primary Care Provider: FITZGERALD, DAVID Other Clinician: Referring Provider: FITZGERALD, DAVID Treating Provider/Extender: Cathie Olden in Treatment: 2 History of Present Illness HPI Description: 10/06/17-She is here for initial evaluation of a chronic right lateral malleolus ulcer. She provides a vague history regarding this ulcer. The ulcer has been present for greater than 6 months, although she cannot specify anything more than that. She denies any known trauma or precipitating event other than scratching. She admits to seeing dermatology (does not remember who; there are no records in epic system) several months ago at which time they removed a scab to reveal the current ulceration; she says at one time they considered performing a biopsy but did not perform because it was "getting better". According to her and her husband the ulcer is essentially unchanged, has never healed, never been worse.  She reports pain only with squeezing pressure, otherwise denies pain. She reports minimal to no drainage; has been applying antibiotic ointment/cream. She carries a history of pulmonary sarcoidosis; non-diabetic; non-smoker; chronic immunosuppression with prednisone 10/20/17-She is here for follow-up evaluation for chronic right lateral malleolus ulcer. There is essentially no change. Did review you and see records. 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 She is voicing no complaints or concerns. We will transition to collagen product and evaluate for any improvement. Electronic Signature(s) Signed: 10/20/2017 2:56:24 PM By: [name] Entered By: [name] on 10/20/2017 14:56:24 Autumn Gathers IMarland Kitchen ([id number]) -------------------------------------------------------------------------------- Physician Orders Details Patient Name: Autumn Gathers I. Date of Service: 10/20/2017 1:30 PM Medical Record Number: [id number] Patient Account Number: [id number] Date of Birth/Sex: 08-13-1945 (72 y.o. F) Treating RN: Ahmed Prima Primary Care Provider: FITZGERALD, DAVID Other Clinician: Referring Provider: FITZGERALD, DAVID Treating Provider/Extender: Cathie Olden in Treatment: 2 Verbal / Phone Orders: Yes Clinician: Pinkerton, Debi Read Back and Verified: Yes Diagnosis Coding Wound Cleansing Wound #1 Right,Lateral Malleolus o Clean wound with Normal Saline. o Cleanse wound with mild soap and water o May Shower, gently pat wound dry prior to applying new dressing. Anesthetic (add to Medication List) Wound #1 Right,Lateral Malleolus o Topical Lidocaine 4% cream applied to wound bed prior to debridement (In Clinic Only). Primary Wound Dressing Wound #1 Right,Lateral Malleolus o Silver Collagen Secondary Dressing Wound #1 [place] Dressing Change  Frequency Wound #1 Right,Lateral Malleolus o Change dressing every day. Follow-up Appointments Wound #1 Right,Lateral Malleolus o Return Appointment in 2 weeks. Off-Loading Wound #1 Right,Lateral Malleolus o Other: - keep pressure off of affected area Additional Orders / Instructions Wound #1 Right,Lateral Malleolus o Increase protein intake. Oxygen Administration Wound #1 Right,Lateral Malleolus o While patient is in clinic, provide supplimental oxygen via nasal cannula at liters/min __ : - 2  LPM via [place] Patient Medications KATHYRN, WARMUTH I. ([id number]) Allergies: Iodinated Contrast- Oral and IV Dye, metrizamide Notifications Medication Indication Start End lidocaine DOSE 1 - topical 4 % cream - 1 cream topical Electronic Signature(s) Signed: 10/20/2017 9:10:59 PM By: [name] Signed: 10/21/2017 11:31:29 AM By: [name] Entered By: [name] on 10/20/2017 14:02:56 Autumn Gathers I. ([id number]) -------------------------------------------------------------------------------- Prescription 10/20/2017 Patient Name: Autumn [name] Provider: [name] NP Date of Birth: 29-Dec-1945 NPI#: [id number] Sex: F DEA#: [id number] Phone #: [phone] License #: Patient Address: [place] [address] [facility] [place], [place] [address] [address], [place], [place] [address] [phone] Allergies Iodinated Contrast- Oral and IV Dye metrizamide Medication Medication: Route: Strength: Form: lidocaine topical 4% cream Class: TOPICAL LOCAL ANESTHETICS Dose: Frequency / Time: Indication: 1 1 cream topical Number of Refills: Number of Units: 0 Generic Substitution: Start Date: End Date: Administered at Substitution Permitted Facility: Yes Time Administered: Time Discontinued: Note to Pharmacy: Signature(s): Date(s): Electronic Signature(s) Signed: 10/20/2017 9:10:59 PM  By: [name] Signed: 10/21/2017 11:31:29 AM By: Penni Homans I. ([id number]) Entered By: [name] on 10/20/2017 14:02:56 Vineyard, [name] ([id number]) --------------------------------------------------------------------------------  Problem List Details Patient Name: Autumn Gathers I. Date of Service: 10/20/2017 1:30 PM Medical Record Number: [id number] Patient Account Number: [id number] Date of Birth/Sex: 05/11/1945 (72 y.o. 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 ([id number]) -------------------------------------------------------------------------------- Progress Note Details Patient Name: Autumn Gathers I. Date of Service: 10/20/2017 1:30 PM Medical Record Number: [id number] Patient Account Number: [id number] Date of Birth/Sex: [birth year] (72 y.o. F) Treating RN: Ahmed Prima Primary Care Provider: FITZGERALD, DAVID Other Clinician: Referring Provider: FITZGERALD, DAVID Treating Provider/Extender: Cathie Olden in Treatment: 2 Subjective Chief Complaint Information obtained from Patient chronic right lateral malleolus ulcer History of Present Illness (HPI) 10/06/17-She is here for initial evaluation of a chronic right lateral malleolus ulcer. She provides a vague history regarding this ulcer. The ulcer has been present for greater than 6 months, although she cannot specify anything more than that.  She denies any known trauma or precipitating event other than scratching. She admits to seeing dermatology (does not remember who; there are no records in epic system) several months ago at which time they removed a scab to reveal the current ulceration; she says at one time they considered performing a biopsy but did not perform because it was "getting better". According to her and her husband the ulcer is essentially unchanged, has never healed, never been worse. She reports pain only with squeezing pressure, otherwise denies pain. She reports minimal to no drainage; has been applying antibiotic ointment/cream. She carries a history of pulmonary sarcoidosis; non-diabetic; non-smoker; chronic immunosuppression with prednisone 10/20/17-She is here for follow-up evaluation for chronic right lateral malleolus ulcer. There is essentially no change. Did review you and see records. 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 She is voicing no complaints or concerns. We will transition to collagen product and evaluate for any improvement. Patient History Information  obtained from Patient. Family History Cancer - Mother, No family history of Diabetes, Heart Disease, Hereditary Spherocytosis, Hypertension, Kidney Disease, Lung Disease, Seizures, Stroke, Thyroid Problems, Tuberculosis. Social History Former smoker - 20 years, Marital Status - Married, Alcohol Use - Rarely, Drug Use - No History, Caffeine Use - Rarely. Medical And Surgical History Notes Respiratory sarcoidosis Oncologic ovarian cancer - ovaries removed Steinmetz, Natash I. ([id number]) Objective Constitutional Vitals Time Taken: 1:49 PM, Height: 68 in, Weight: 121 lbs, BMI: 18.4, Temperature: 97.9 F, Pulse: 86 bpm, Respiratory Rate: 18 breaths/min, Blood Pressure: 128/52 mmHg. Integumentary (Hair, Skin) Wound #1 status is Open. Original cause of wound was  Gradually Appeared. The wound is located on the Right,Lateral Malleolus. The wound measures 0.2cm length x 0.2cm width x 0.2cm depth; 0.031cm^2 area and 0.006cm^3 volume. There is Fat Layer (Subcutaneous Tissue) Exposed exposed. There is no tunneling or undermining noted. There is a medium amount of serous drainage noted. The wound margin is flat and intact. There is medium (34-66%) pink granulation within the wound bed. There is a medium (34-66%) amount of necrotic tissue within the wound bed including Adherent Slough. The periwound skin appearance exhibited: Ecchymosis. The periwound skin appearance did not exhibit: Callus, Crepitus, Excoriation, Induration, Rash, Scarring, Dry/Scaly, Maceration, Atrophie Blanche, Cyanosis, Hemosiderin Staining, Mottled, Pallor, Rubor, Erythema. Periwound temperature was noted as No Abnormality. The periwound has tenderness on palpation. 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 Cleanse wound with mild soap and water May Shower, gently pat wound dry prior to applying new dressing. Anesthetic (add to Medication List): Wound #1 Right,Lateral Malleolus: Topical Lidocaine 4% cream applied to wound bed prior to debridement (In Clinic Only). Primary Wound Dressing: Wound #1 Right,Lateral Malleolus: Silver Collagen Secondary Dressing: Wound #1 Right,Lateral Malleolus: Foam [place] Dressing Change Frequency: Wound #1 Right,Lateral Malleolus: Change dressing every day. Follow-up Appointments: Wound #1 Right,Lateral Malleolus: Return Appointment in 2 weeks. Off-Loading: MELLONIE, GUESS I. ([id number]) Wound #1 Right,Lateral Malleolus: Other: - keep pressure off of affected area Additional Orders / Instructions: Wound #1 Right,Lateral Malleolus: Increase protein intake. Oxygen  Administration: Wound #1 Right,Lateral Malleolus: While patient is in clinic, provide supplimental oxygen via nasal cannula at liters/min __ : - 2 LPM via [place] The following medication(s) was prescribed: lidocaine topical 4 % cream 1 1 cream topical was prescribed at facility Electronic Signature(s) Signed: 10/20/2017 2:56:55 PM By: [name] Entered By: [name] on 10/20/2017 14:56:54 Youngers, [name] ([id number]) -------------------------------------------------------------------------------- ROS/PFSH Details Patient Name: Autumn Gathers I. Date of Service: 10/20/2017 1:30 PM Medical Record Number: [id number] Patient Account Number: [id number] Date of Birth/Sex: 04-06-1946 (72 y.o. F) Treating RN: Ahmed Prima Primary Care Provider: FITZGERALD, DAVID Other Clinician: Referring Provider: FITZGERALD, DAVID Treating Provider/Extender: [name] Weeks in Treatment: 2 Information Obtained From Patient Wound History Do you currently have one or more open woundso Yes How many open wounds do you currently haveo 1 Approximately how long have you had your woundso 3 or 4 months How have you been treating your wound(s) until nowo mupirocin Has your wound(s) ever healed and then re-openedo No Have you had any lab work done in the past montho Yes Who ordered the lab work doneo PCP Have you tested positive for an antibiotic resistant organism (MRSA, VRE)o No Have you tested positive for osteomyelitis (bone infection)o No Have you had any tests for circulation on your legso No Eyes  Medical History: Positive for: Glaucoma Negative for: Cataracts; Optic Neuritis Ear/Nose/Mouth/Throat Medical History: Negative for: Chronic sinus problems/congestion; Middle ear problems Hematologic/Lymphatic Medical History: Negative for: Anemia; Hemophilia; Human Immunodeficiency Virus; Lymphedema; Sickle Cell Disease Respiratory Medical History: Positive for: Chronic Obstructive Pulmonary Disease  (COPD) Negative for: Aspiration; Asthma; Pneumothorax; Sleep Apnea; Tuberculosis Past Medical History Notes: sarcoidosis Cardiovascular Medical History: Positive for: Hypertension Negative for: Angina; Arrhythmia; Congestive Heart Failure; Coronary Artery Disease; Deep Vein Thrombosis; Hypotension; Myocardial Infarction; Peripheral Arterial Disease; Peripheral Venous Disease; Phlebitis; Vasculitis Gastrointestinal Medical History: Negative for: Cirrhosis ; Colitis; Crohnos; Hepatitis A; Hepatitis B; Hepatitis C Plourde, Caro I. ([id number]) Endocrine Medical History: Negative for: Type I Diabetes; Type II Diabetes Genitourinary Medical History: Negative for: End Stage Renal Disease Immunological Medical History: Negative for: Lupus Erythematosus; Raynaudos; Scleroderma Integumentary (Skin) Medical History: Negative for: History of Burn; History of pressure wounds Musculoskeletal Medical History: Negative for: Gout; Rheumatoid Arthritis; Osteoarthritis; Osteomyelitis Neurologic Medical History: Negative for: Dementia; Neuropathy; Quadriplegia; Paraplegia; Seizure Disorder Oncologic Medical History: Positive for: Received Chemotherapy - 2011 Negative for: Received Radiation Past Medical History Notes: ovarian cancer - ovaries removed Psychiatric Medical History: Negative for: Anorexia/bulimia; Confinement Anxiety HBO Extended History Items Eyes: Glaucoma Immunizations Pneumococcal Vaccine: Received Pneumococcal Vaccination: Yes Immunization Notes: up to date Implantable Devices Family and Social History Cancer: Yes - Mother; Diabetes: No; Heart Disease: No; Hereditary Spherocytosis: No; Hypertension: No; Kidney Disease: No; Lung Disease: No; Seizures: No; Stroke: No; Thyroid Problems: No; Tuberculosis: No; Former smoker - 37 years; Marital Status - Married; Alcohol Use: Rarely; Drug Use: No History; Caffeine Use: Rarely; Financial Concerns: No; Food, Clothing [place],  [place] ([id number]) or Shelter Needs: No; Support System Lacking: No; Transportation Concerns: No; Advanced Directives: No; Patient does not want information on Advanced Directives Physician Affirmation I have reviewed and agree with the above information. Electronic Signature(s) Signed: 10/20/2017 9:10:59 PM By: [name] Signed: 10/21/2017 11:31:29 AM By: [name] Entered By: [name] on 10/20/2017 14:56:35 Jayson, [name] ([id number]) -------------------------------------------------------------------------------- SuperBill Details Patient Name: Autumn Gathers I. Date of Service: 10/20/2017 Medical Record Number: [id number] Patient Account Number: [id number] Date of Birth/Sex: 1945/10/25 (72 y.o. 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

## 2017-11-03 ENCOUNTER — Encounter: Payer: Medicare Other | Admitting: Nurse Practitioner

## 2017-11-03 DIAGNOSIS — L97312 Non-pressure chronic ulcer of right ankle with fat layer exposed: Secondary | ICD-10-CM | POA: Diagnosis not present

## 2017-11-17 ENCOUNTER — Encounter: Payer: Medicare Other | Attending: Nurse Practitioner | Admitting: Nurse Practitioner

## 2017-11-17 DIAGNOSIS — J449 Chronic obstructive pulmonary disease, unspecified: Secondary | ICD-10-CM | POA: Insufficient documentation

## 2017-11-17 DIAGNOSIS — L97311 Non-pressure chronic ulcer of right ankle limited to breakdown of skin: Secondary | ICD-10-CM | POA: Insufficient documentation

## 2017-11-17 DIAGNOSIS — I10 Essential (primary) hypertension: Secondary | ICD-10-CM | POA: Insufficient documentation

## 2017-11-17 DIAGNOSIS — D860 Sarcoidosis of lung: Secondary | ICD-10-CM | POA: Diagnosis not present

## 2017-11-22 ENCOUNTER — Encounter: Payer: Self-pay | Admitting: Student in an Organized Health Care Education/Training Program

## 2017-11-22 ENCOUNTER — Ambulatory Visit
Payer: Medicare Other | Attending: Student in an Organized Health Care Education/Training Program | Admitting: Student in an Organized Health Care Education/Training Program

## 2017-11-22 ENCOUNTER — Other Ambulatory Visit: Payer: Self-pay

## 2017-11-22 VITALS — BP 107/56 | HR 66 | Temp 98.6°F | Resp 18 | Ht 68.0 in | Wt 121.0 lb

## 2017-11-22 DIAGNOSIS — Z8543 Personal history of malignant neoplasm of ovary: Secondary | ICD-10-CM | POA: Diagnosis not present

## 2017-11-22 DIAGNOSIS — R1012 Left upper quadrant pain: Secondary | ICD-10-CM | POA: Diagnosis not present

## 2017-11-22 DIAGNOSIS — G894 Chronic pain syndrome: Secondary | ICD-10-CM | POA: Diagnosis not present

## 2017-11-22 DIAGNOSIS — J449 Chronic obstructive pulmonary disease, unspecified: Secondary | ICD-10-CM | POA: Insufficient documentation

## 2017-11-22 DIAGNOSIS — M5414 Radiculopathy, thoracic region: Secondary | ICD-10-CM | POA: Insufficient documentation

## 2017-11-22 DIAGNOSIS — I10 Essential (primary) hypertension: Secondary | ICD-10-CM | POA: Insufficient documentation

## 2017-11-22 DIAGNOSIS — Z5181 Encounter for therapeutic drug level monitoring: Secondary | ICD-10-CM | POA: Diagnosis present

## 2017-11-22 DIAGNOSIS — Z803 Family history of malignant neoplasm of breast: Secondary | ICD-10-CM | POA: Insufficient documentation

## 2017-11-22 DIAGNOSIS — Z79899 Other long term (current) drug therapy: Secondary | ICD-10-CM | POA: Insufficient documentation

## 2017-11-22 DIAGNOSIS — I272 Pulmonary hypertension, unspecified: Secondary | ICD-10-CM | POA: Insufficient documentation

## 2017-11-22 DIAGNOSIS — M792 Neuralgia and neuritis, unspecified: Secondary | ICD-10-CM

## 2017-11-22 DIAGNOSIS — Z7952 Long term (current) use of systemic steroids: Secondary | ICD-10-CM | POA: Diagnosis not present

## 2017-11-22 DIAGNOSIS — D869 Sarcoidosis, unspecified: Secondary | ICD-10-CM | POA: Diagnosis not present

## 2017-11-22 DIAGNOSIS — Z79891 Long term (current) use of opiate analgesic: Secondary | ICD-10-CM | POA: Diagnosis not present

## 2017-11-22 DIAGNOSIS — B0229 Other postherpetic nervous system involvement: Secondary | ICD-10-CM | POA: Diagnosis not present

## 2017-11-22 MED ORDER — PREGABALIN 100 MG PO CAPS: 100.0000 mg | ORAL_CAPSULE | Freq: Three times a day (TID) | ORAL | 1 refills | Status: DC

## 2017-11-22 MED ORDER — BUPRENORPHINE HCL 300 MCG BU FILM: 300.0000 ug | ORAL_FILM | Freq: Two times a day (BID) | BUCCAL | 0 refills | Status: AC

## 2017-11-22 NOTE — Patient Instructions (Signed)
Stop gabapentin.  Start Lyrica at prescribed dose.  We will continue to wean [place]- e prescribed to pharmacy

## 2017-11-22 NOTE — Progress Notes (Signed)
Patient's Name: Autumn [name]  MRN: [id number]  Referring Provider: Leonel Ramsay, MD  DOB: Sep 01, 1945  PCP: Baxter Hire, MD  DOS: 11/22/2017  Note by: Gillis Santa, MD  Service setting: Ambulatory outpatient  Specialty: Interventional Pain Management  Location: [facility] (AMB) Pain Management Facility    Patient type: Established   Primary Reason(s) for Visit: Encounter for prescription drug management. (Level of risk: moderate)  CC: Abdominal Pain (left upper)  HPI  [name] is a 72 y.o. year old, female patient, who comes today for a medication management evaluation. She has Pneumonia; COPD (chronic obstructive pulmonary disease) ([place]); Long term current use of systemic steroids; Post herpetic neuralgia; Pulmonary hypertension (HCC); and Pulmonary sarcoidosis (HCC) on their problem list. Her primarily concern today is the Abdominal Pain (left upper)  Pain Assessment: Location: Left, Upper Abdomen Radiating: radiates around to back Onset: More than a month ago Duration: Chronic pain Quality: Constant, Sharp, Stabbing Severity: 7 /10 (subjective, self-reported pain score)  Note: Reported level is inconsistent with clinical observations. Clinically the patient looks like a 3/10 A 3/10 is viewed as "Moderate" and described as significantly interfering with activities of daily living (ADL). It becomes difficult to feed, bathe, get dressed, get on and off the toilet or to perform personal hygiene functions. Difficult to get in and out of bed or a chair without assistance. Very distracting. With effort, it can be ignored when deeply involved in activities. Information on the proper use of the pain scale provided to the patient today. When using our objective Pain Scale, levels between 6 and 10/10 are said to belong in an emergency room, as it progressively worsens from a 6/10, described as severely limiting, requiring emergency care not usually available at an outpatient pain management  facility. At a 6/10 level, communication becomes difficult and requires great effort. Assistance to reach the emergency department may be required. Facial flushing and profuse sweating along with potentially dangerous increases in heart rate and blood pressure will be evident. Effect on ADL: "I cant seem to do much" Timing: Constant Modifying factors: belbuca calms it some BP: (!) 107/56  HR: 66  [name] was last scheduled for an appointment on 10/20/2017 for medication management. During today's appointment we reviewed [name]'s chronic pain status, as well as her outpatient medication regimen.  Patient follows up today for medication management.  At her last visit we weaned her buccal Butrans from 600 mcg twice daily to 450 mcg twice daily.  She states that she is tolerating the wean fine without any issues.  She does not endorse any worsening of pain with the wean.  Patient is also interested in trialing Lyrica.  The patient  reports that she does not use drugs. Her body mass index is 18.4 kg/m.  Further details on both, my assessment(s), as well as the proposed treatment plan, please see below.  Controlled Substance Pharmacotherapy Assessment REMS (Risk Evaluation and Mitigation Strategy)  Analgesic: Butrans buccal (Belbuca) 450 mcg twice daily MME/day: 28 mg/day.  [name], RN  11/22/2017 12:14 PM  Signed Nursing Pain Medication Assessment:  Safety precautions to be maintained throughout the outpatient stay will include: orient to surroundings, keep bed in low position, maintain call bell within reach at all times, provide assistance with transfer out of bed and ambulation.  Medication Inspection Compliance: Pill count conducted under aseptic conditions, in front of the patient. Neither the pills nor the bottle was removed from the patient's sight at any time.  Once count was completed pills were immediately returned to the patient in their original bottle.  Medication:  Belbuca Pill/Patch Count: 3 of 60 patches remain Pill/Patch Appearance: Markings consistent with prescribed medication Bottle Appearance: Standard pharmacy container. Clearly labeled. Filled Date:07/15/219  Last Medication intake:  Today   Pharmacokinetics: Liberation and absorption (onset of action): WNL Distribution (time to peak effect): WNL Metabolism and excretion (duration of action): WNL         Pharmacodynamics: Desired effects: Analgesia: [name] reports >50% benefit. Functional ability: Patient reports that medication allows her to accomplish basic ADLs Clinically meaningful improvement in function (CMIF): Sustained CMIF goals met Perceived effectiveness: Described as relatively effective, allowing for increase in activities of daily living (ADL) Undesirable effects: Side-effects or Adverse reactions: None reported Monitoring: [place] PMP: Online review of the past 56-monthperiod conducted. Compliant with practice rules and regulations Last UDS on record: Summary  Date Value Ref Range Status  03/22/2017 FINAL  Final    Comment:    ==================================================================== TOXASSURE COMP DRUG ANALYSIS,UR ==================================================================== Test                             Result       Flag       Units Drug Present and Declared for Prescription Verification   Buprenorphine                  23           EXPECTED   ng/mg creat   Norbuprenorphine               112          EXPECTED   ng/mg creat    Source of buprenorphine is a scheduled prescription medication.    Norbuprenorphine is an expected metabolite of buprenorphine.   Gabapentin                     PRESENT      EXPECTED   Sertraline                     PRESENT      EXPECTED   Desmethylsertraline            PRESENT      EXPECTED    Desmethylsertraline is an expected metabolite of sertraline.   Atenolol                       PRESENT      EXPECTED Drug Absent  but Declared for Prescription Verification   Hydrocodone                    Not Detected UNEXPECTED ng/mg creat   Tramadol                       Not Detected UNEXPECTED ng/mg creat   Acetaminophen                  Not Detected UNEXPECTED    Acetaminophen, as indicated in the declared medication list, is    not always detected even when used as directed.   Ibuprofen                      Not Detected UNEXPECTED    Ibuprofen, as indicated in the declared medication list, is not    always detected even when  used as directed.   Diphenhydramine                Not Detected UNEXPECTED ==================================================================== Test                      Result    Flag   Units      Ref Range   Creatinine              182              mg/dL      >=20 ==================================================================== Declared Medications:  The flagging and interpretation on this report are based on the  following declared medications.  Unexpected results may arise from  inaccuracies in the declared medications.  **Note: The testing scope of this panel includes these medications:  Atenolol  Buprenorphine  Diphenhydramine  Gabapentin  Hydrocodone (Hydrocodone-Acetaminophen)  Sertraline  Tramadol  **Note: The testing scope of this panel does not include small to  moderate amounts of these reported medications:  Acetaminophen (Hydrocodone-Acetaminophen)  Ibuprofen  **Note: The testing scope of this panel does not include following  reported medications:  Albuterol  Amoxicillin (Augmentin)  Atorvastatin  Azithromycin  Brimonidine Tartrate  Brinzolamide  Calcium (Calcium/Vitamin D3)  Clavulinate (Augmentin)  Fluticasone (Advair)  Latanoprost  Omega-3 Fatty Acids  Ondansetron  Prednisone  Salmeterol (Advair)  Tiotropium  Topical  Vitamin D3 (Calcium/Vitamin D3) ==================================================================== For clinical consultation,  please call [phone]. ====================================================================    UDS interpretation: Compliant          Medication Assessment Form: Reviewed. Patient indicates being compliant with therapy Treatment compliance: Compliant Risk Assessment Profile: Aberrant behavior: See prior evaluations. None observed or detected today Comorbid factors increasing risk of overdose: See prior notes. No additional risks detected today Risk of substance use disorder (SUD): Low  ORT Scoring interpretation table:  Score <3 = Low Risk for SUD  Score between 4-7 = Moderate Risk for SUD  Score >8 = High Risk for Opioid Abuse   Risk Mitigation Strategies:  Patient Counseling: Covered Patient-Prescriber Agreement (PPA): Present and active  Notification to other healthcare providers: Done  Pharmacologic Plan: Will wean Belbuca to 300 mcg BID              Laboratory Chemistry  Inflammation Markers (CRP: Acute Phase) (ESR: Chronic Phase) Lab Results  Component Value Date   ESRSEDRATE 48 (H) 05/25/2011                         Rheumatology Markers No results found for: RF, ANA, LABURIC, URICUR, LYMEIGGIGMAB, LYMEABIGMQN, HLAB27                      Renal Function Markers Lab Results  Component Value Date   BUN 23 (H) 11/18/2015   CREATININE 0.70 11/18/2015   GFRAA >60 11/18/2015   GFRNONAA >60 11/18/2015                             Hepatic Function Markers Lab Results  Component Value Date   AST 21 03/29/2015   ALT 20 03/29/2015   ALBUMIN 4.4 03/29/2015   ALKPHOS 46 03/29/2015   LIPASE 116 12/15/2013                        Electrolytes Lab Results  Component Value Date   NA 139 11/18/2015  K 3.6 11/18/2015   CL 97 (L) 11/18/2015   CALCIUM 9.7 11/18/2015                        Neuropathy Markers No results found for: VITAMINB12, FOLATE, HGBA1C, HIV                      Bone Pathology Markers No results found for: VD25OH, [id number], QM2500BB0,  WU8891QX4, 25OHVITD1, 25OHVITD2, 25OHVITD3, TESTOFREE, TESTOSTERONE                       Coagulation Parameters Lab Results  Component Value Date   INR 1.0 02/17/2013   LABPROT 13.2 02/17/2013   PLT 299 11/20/2015                        Cardiovascular Markers Lab Results  Component Value Date   CKTOTAL 66 02/17/2013   CKMB 0.7 02/17/2013   TROPONINI 0.04 (HH) 11/17/2015   HGB 11.6 (L) 11/20/2015   HCT 34.5 (L) 11/20/2015                         CA Markers No results found for: CEA, CA125, LABCA2                      Note: Lab results reviewed.  Recent Diagnostic Imaging Results  DG Ankle Complete Right CLINICAL DATA:  Nonhealing wound of the right lower extremity.  EXAM: RIGHT ANKLE - COMPLETE 3+ VIEW  COMPARISON:  None.  FINDINGS: There is a soft tissue wound over the tip of the distal fibula. No soft tissue air.  There is no fracture. There is no bone resorption to suggest osteomyelitis.  The ankle mortise is normally spaced and aligned.  IMPRESSION: 1. No evidence of osteomyelitis. 2. No fracture. 3. Lateral right ankle soft tissue wound.  Electronically Signed   By: Lajean Manes M.D.   On: 10/06/2017 19:05  Complexity Note: Imaging results reviewed. Results shared with Ms. Eulas Post, using Layman's terms.                         Meds   Current Outpatient Medications:  .  albuterol (PROVENTIL) (2.5 MG/3ML) 0.083% nebulizer solution, Take 2.5 mg by nebulization every 4 (four) hours as needed. , Disp: , Rfl:  .  atenolol (TENORMIN) 25 MG tablet, Take 1 tablet by mouth 2 (two) times daily., Disp: , Rfl:  .  atorvastatin (LIPITOR) 10 MG tablet, Take 1 tablet by mouth daily at 6 PM., Disp: , Rfl:  .  CALCIUM 600+D3 600-800 MG-UNIT TABS, Take 1 tablet by mouth 2 (two) times daily with a meal., Disp: , Rfl: 11 .  denosumab (PROLIA) 60 MG/ML SOLN injection, Inject into the skin., Disp: , Rfl:  .  Fluticasone-Salmeterol (ADVAIR DISKUS) 250-50 MCG/DOSE AEPB,  Inhale into the lungs., Disp: , Rfl:  .  ibuprofen (ADVIL,MOTRIN) 200 MG tablet, Take 200 mg by mouth every 6 (six) hours as needed., Disp: , Rfl:  .  predniSONE (DELTASONE) 10 MG tablet, Take 10 mg by mouth daily with breakfast. , Disp: , Rfl:  .  sertraline (ZOLOFT) 25 MG tablet, Take 25 mg by mouth daily., Disp: , Rfl: 11 .  tiZANidine (ZANAFLEX) 4 MG tablet, Take 1 tablet (4 mg total) by mouth 2 (two) times daily as needed for muscle  spasms., Disp: 60 tablet, Rfl: 1 .  Brinzolamide-Brimonidine 1-0.2 % SUSP, Apply 1 drop to eye 3 (three) times daily., Disp: , Rfl:  .  Buprenorphine HCl (BELBUCA) 300 MCG FILM, Place 300 mcg inside cheek 2 (two) times daily., Disp: 60 each, Rfl: 0 .  Ibuprofen-Diphenhydramine HCl (ADVIL PM) 200-25 MG CAPS, Take 1 tablet by mouth at bedtime as needed., Disp: , Rfl:  .  latanoprost (XALATAN) 0.005 % ophthalmic solution, Apply 1 drop to eye at bedtime., Disp: , Rfl:  .  lidocaine-prilocaine (EMLA) cream, Apply 1 application topically 2 (two) times daily as needed., Disp: , Rfl:  .  mirtazapine (REMERON) 7.5 MG tablet, every evening., Disp: , Rfl: 11 .  Omega-3 Fatty Acids (FISH OIL PO), Take 1 capsule by mouth every evening., Disp: , Rfl:  .  predniSONE (DELTASONE) 10 MG tablet, , Disp: , Rfl:  .  pregabalin (LYRICA) 100 MG capsule, Take 1 capsule (100 mg total) by mouth 3 (three) times daily., Disp: 90 capsule, Rfl: 1 .  tiotropium (SPIRIVA HANDIHALER) 18 MCG inhalation capsule, Place 1 capsule into inhaler and inhale daily., Disp: , Rfl:  .  traMADol (ULTRAM) 50 MG tablet, Take 1 tablet by mouth 2 (two) times daily., Disp: , Rfl:   ROS  Constitutional: Denies any fever or chills Gastrointestinal: No reported hemesis, hematochezia, vomiting, or acute GI distress Musculoskeletal: Denies any acute onset joint swelling, redness, loss of ROM, or weakness Neurological: No reported episodes of acute onset apraxia, aphasia, dysarthria, agnosia, amnesia, paralysis, loss  of coordination, or loss of consciousness  Allergies  Ms. Deblois is allergic to ivp dye [iodinated diagnostic agents].  Post  Drug: Ms. Ricciardelli  reports that she does not use drugs. Alcohol:  reports that she does not drink alcohol. Tobacco:  reports that she has never smoked. She has never used smokeless tobacco. Medical:  has a past medical history of Allergy, COPD (chronic obstructive pulmonary disease) ([place]), Glaucoma, Hypertension, Osteoporosis, Ovarian cancer ([place]), and Sarcoid. Surgical: Ms. Menard  has a past surgical history that includes Breast biopsy (Right) and Abdominal hysterectomy. Family: family history includes Breast cancer (age of onset: 7) in her mother; Cancer in her mother; Hypertension in her father.  Constitutional Exam  General appearance: Well nourished, well developed, and well hydrated. In no apparent acute distress Vitals:   11/22/17 1204  BP: (!) 107/56  Pulse: 66  Resp: 18  Temp: 98.6 F (37 C)  SpO2: 100%  Weight: 121 lb (54.9 kg)  Height: '5\' 8"'  (1.727 m)   BMI Assessment: Estimated body mass index is 18.4 kg/m as calculated from the following:   Height as of this encounter: '5\' 8"'  (1.727 m).   Weight as of this encounter: 121 lb (54.9 kg).  BMI interpretation table: BMI level Category Range association with higher incidence of chronic pain  <18 kg/m2 Underweight   18.5-24.9 kg/m2 Ideal body weight   25-29.9 kg/m2 Overweight Increased incidence by 20%  30-34.9 kg/m2 Obese (Class I) Increased incidence by 68%  35-39.9 kg/m2 Severe obesity (Class II) Increased incidence by 136%  >40 kg/m2 Extreme obesity (Class III) Increased incidence by 254%   Patient's current BMI Ideal Body weight  Body mass index is 18.4 kg/m. Ideal body weight: 63.9 kg (140 lb 14 oz)   BMI Readings from Last 4 Encounters:  11/22/17 18.40 kg/m  10/20/17 18.40 kg/m  06/21/17 18.70 kg/m  04/26/17 20.36 kg/m   Wt Readings from Last 4 Encounters:  11/22/17 121 lb  (  54.9 kg)  10/20/17 121 lb (54.9 kg)  06/21/17 123 lb (55.8 kg)  03/22/17 130 lb (59 kg)  Psych/Mental status: Alert, oriented x 3 (person, place, & time)       Eyes: PERLA Respiratory: No evidence of acute respiratory distress Cervical Spine Area Exam  Skin & Axial Inspection:No masses, redness, edema, swelling, or associated skin lesions Alignment:Symmetrical Functional WCB:[id number] ROM Stability:No instability detected Muscle Tone/Strength:Functionally intact. No obvious neuro-muscular anomalies detected. Sensory (Neurological):Unimpaired Palpation:No palpable anomalies         Upper Extremity (UE) Exam    Side:Right upper extremity  Side:Left upper extremity   Skin & Extremity Inspection:Skin color, temperature, and hair growth are WNL. No peripheral edema or cyanosis. No masses, redness, swelling, asymmetry, or associated skin lesions. No contractures.  Skin & Extremity Inspection:Skin color, temperature, and hair growth are WNL. No peripheral edema or cyanosis. No masses, redness, swelling, asymmetry, or associated skin lesions. No contractures.   Functional WVP:[id number] ROM  Functional EVO:[id number] ROM   Muscle Tone/Strength:Functionally intact. No obvious neuro-muscular anomalies detected.  Muscle Tone/Strength:Functionally intact. No obvious neuro-muscular anomalies detected.   Sensory (Neurological):Unimpaired  Sensory (Neurological):Unimpaired   Palpation:No palpable anomalies  Palpation:No palpable anomalies   Specialized Test(s):Deferred  Specialized Test(s):Deferred    Thoracic Spine Area Exam  Skin & Axial Inspection:No masses, redness, or swelling Alignment:Symmetrical Functional BZJ:IRCVELFYBOFB ROM Stability:No instability detected Muscle Tone/Strength:Functionally intact. No obvious neuro-muscular anomalies  detected. Sensory (Neurological):tenderness of left back and flank region T4-T6 dermatome with allodynia of left thoracic back, at inferior scapular border Muscle strength & Tone:Tender  Lumbar Spine Area Exam  Skin & Axial Inspection:No masses, redness, or swelling Alignment:Symmetrical Functional PZW:CHENIDPOEUMP ROM Stability:No instability detected Muscle Tone/Strength:Functionally intact. No obvious neuro-muscular anomalies detected. Sensory (Neurological):Unimpaired Palpation:No palpable anomalies Provocative Tests: Lumbar Hyperextension and rotation test:Positivebilaterally for facet joint pain. Lumbar Lateral bending test:Positive Patrick's Maneuver:evaluation deferred today    Gait & Posture Assessment  Ambulation: Patient came in today in a wheel chair Gait: Limited. Using assistive device to ambulate Posture: Difficulty standing up straight, due to pain   Lower Extremity Exam    Side: Right lower extremity  Side: Left lower extremity  Stability: No instability observed          Stability: No instability observed          Skin & Extremity Inspection: Skin color, temperature, and hair growth are WNL. No peripheral edema or cyanosis. No masses, redness, swelling, asymmetry, or associated skin lesions. No contractures.  Skin & Extremity Inspection: Skin color, temperature, and hair growth are WNL. No peripheral edema or cyanosis. No masses, redness, swelling, asymmetry, or associated skin lesions. No contractures.  Functional ROM: Unrestricted ROM                  Functional ROM: Unrestricted ROM                  Muscle Tone/Strength: Functionally intact. No obvious neuro-muscular anomalies detected.  Muscle Tone/Strength: Functionally intact. No obvious neuro-muscular anomalies detected.  Sensory (Neurological): Unimpaired  Sensory (Neurological): Unimpaired  Palpation: No palpable anomalies  Palpation: No palpable anomalies    Assessment  Primary Diagnosis & Pertinent Problem List: The primary encounter diagnosis was Post herpetic neuralgia. Diagnoses of Thoracic radiculopathy, Chronic pain syndrome, Chronic use of opiate for therapeutic purpose, Neuropathic pain, and Long term current use of systemic steroids were also pertinent to this visit.  Status Diagnosis  Persistent Persistent Persistent 1. Post herpetic neuralgia   2. Thoracic radiculopathy  3. Chronic pain syndrome   4. Chronic use of opiate for therapeutic purpose   5. Neuropathic pain   6. Long term current use of systemic steroids      General Recommendations: The pain condition that the patient suffers from is best treated with a multidisciplinary approach that involves an increase in physical activity to prevent de-conditioning and worsening of the pain cycle, as well as psychological counseling (formal and/or informal) to address the co-morbid psychological affects of pain. Treatment will often involve judicious use of pain medications and interventional procedures to decrease the pain, allowing the patient to participate in the physical activity that will ultimately produce long-lasting pain reductions. The goal of the multidisciplinary approach is to return the patient to a higher level of overall function and to restore their ability to perform activities of daily living.  72 year old female with a history of left flank and chest pain secondary to postherpetic neuralgia in 2013 who presents formed refill/chronic pain management. Patient was previously being seen at the [facility]. Her symptoms were managed on Butrans film:Belbuca600 mcg twice daily along with gabapentin 2400 mg daily. She states that the medications allow her to function and improve her neuropathic pain symptoms. She is status post bilateral T5-T6 transforaminal epidural steroid injection in June 2018 with no pain relief and also status post DRG spinal cord  stimulator trial on Aug 16, 2016 without any significant pain relief.  Patient follows up today for medication management.  Patient is interested in weaning her Belbuca further. At her last visit we weaned her buccal Butrans from 600 mcg twice daily to 450 mcg twice daily.  She states that she is tolerating the wean fine without any issues.  She does not endorse any worsening of pain with the wean.  Patient is also interested in trialing Lyrica.  Plan: -Wean Belbuca to 300 mcg twice daily -Stop gabapentin -Start Lyrica 100 mg 3 times daily -Continue tizanidine as prescribed no refill needed  Future considerations:T5-T6 facet injections  Plan of Care  Pharmacotherapy (Medications Ordered): Meds ordered this encounter  Medications  . Buprenorphine HCl (BELBUCA) 300 MCG FILM    Sig: Place 300 mcg inside cheek 2 (two) times daily.    Dispense:  60 each    Refill:  0  . pregabalin (LYRICA) 100 MG capsule    Sig: Take 1 capsule (100 mg total) by mouth 3 (three) times daily.    Dispense:  90 capsule    Refill:  1    Do not place this medication, or any other prescription from our practice, on "Automatic Refill". Patient may have prescription filled one day early if pharmacy is closed on scheduled refill date.   Lab-work, procedure(s), and/or referral(s): No orders of the defined types were placed in this encounter.  Time Note:Greater than 50% of the 31mnute(s) of face-to-face time spent with Ms.CHosmer was spent in counseling/coordination of care regarding:opioid tolerance,Ms.Yadav's primary cause of pain, the treatment plan, treatment alternatives, the risks and possible complications of proposed treatment, medication side effects, the appropriate use ofhermedications, realistic expectations and the medication agreement..  Provider-requested follow-up: Return in about 4 weeks (around 12/20/2017) for Medication Management.  Future Appointments  Date Time Provider [place] 12/01/2017  1:00 PM CLawanda Cousins NP [facility] None  12/20/2017 10:45 AM [facility]is [facility]    Primary Care Physician: JBaxter Hire MD [facility] Note by: BGillis Santa M.D Date: 11/22/2017; Time: 12:47 PM  Patient Instructions  Stop gabapentin.  Start Lyrica at prescribed dose.  We will continue to wean Belbuca- e prescribed to pharmacy

## 2017-11-22 NOTE — Progress Notes (Signed)
Nursing Pain Medication Assessment:  Safety precautions to be maintained throughout the outpatient stay will include: orient to surroundings, keep bed in low position, maintain call bell within reach at all times, provide assistance with transfer out of bed and ambulation.  Medication Inspection Compliance: Pill count conducted under aseptic conditions, in front of the patient. Neither the pills nor the bottle was removed from the patient's sight at any time. Once count was completed pills were immediately returned to the patient in their original bottle.  Medication: [place] Pill/Patch Count: 3 of 60 patches remain Pill/Patch Appearance: Markings consistent with prescribed medication Bottle Appearance: Standard pharmacy container. Clearly labeled. Filled Date:07/15/219  Last Medication intake:  Today

## 2017-11-28 NOTE — Progress Notes (Signed)
HAYAT, WARBINGTON ([id number]) Visit Report for 11/17/2017 Chief Complaint Document Details Patient Name: Autumn [name], Autumn I. Date of Service: 11/17/2017 10:15 AM Medical Record Number: [id number] Patient Account Number: [id number] Date of Birth/Sex: 01-25-1946 (72 y.o. F) Treating RN: Ahmed Prima Primary Care Provider: FITZGERALD, DAVID Other Clinician: Referring Provider: FITZGERALD, DAVID Treating Provider/Extender: Cathie Olden in Treatment: 6 Information Obtained from: Patient Chief Complaint chronic right lateral malleolus ulcer Electronic Signature(s) Signed: 11/17/2017 11:18:23 AM By: [name] Entered By: [name] on 11/17/2017 11:18:23 Autumn [name], Autumn Hua I. ([id number]) -------------------------------------------------------------------------------- HPI Details Patient Name: Autumn Gathers I. Date of Service: 11/17/2017 10:15 AM Medical Record Number: [id number] Patient Account Number: [id number] Date of Birth/Sex: [birth year] (72 y.o. F) Treating RN: Ahmed Prima Primary Care Provider: FITZGERALD, DAVID Other Clinician: Referring Provider: FITZGERALD, DAVID Treating Provider/Extender: Cathie Olden in Treatment: 6 History of Present Illness HPI Description: 10/06/17-She is here for initial evaluation of a chronic right lateral malleolus ulcer. She provides a vague history regarding this ulcer. The ulcer has been present for greater than 6 months, although she cannot specify anything more than that. She denies any known trauma or precipitating event other than scratching. She admits to seeing dermatology (does not remember who; there are no records in epic system) several months ago at which time they removed a scab to reveal the current ulceration; she says at one time they considered performing a biopsy but did not perform because it was "getting better". According to her and her husband the ulcer is essentially unchanged, has never healed, never been worse.  She reports pain only with squeezing pressure, otherwise denies pain. She reports minimal to no drainage; has been applying antibiotic ointment/cream. She carries a history of pulmonary sarcoidosis; non-diabetic; non-smoker; chronic immunosuppression with prednisone 10/20/17-She is here for follow-up evaluation for chronic right lateral malleolus ulcer. There is essentially no change. Did review you and see records. 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 She is voicing no complaints or concerns. We will transition to collagen product and evaluate for any improvement. 11/03/17-She is seen in follow-up evaluation for chronic right lateral malleolus ulcer. After selective debridement of dried exudate and epidermis it appears that most of the wound is epithelialized with a small area of partial-thickness opening to the proximal aspect; the other alternative to my assessment is that there is an extremely dry and fibrotic base, not yielding to sharp debridement. We will trial since her for 2 weeks, as this will not damage tissue there is epithelialized but may contribute and softening and/or enzymatically debriding any fibrotic/nonviable tissue. 11/17/17-She is seen in follow-up vibration for chronic right lateral malleolus ulcer. There is no evidence of drainage; she and her spouse deny any evidence of drainage over the last 2 weeks. There is continued improvement and pain. We will continue with 2 additional weeks of Santyl; I do believe she is healed but will continue with treatment in the case that there is nonvisible opening. She will follow-up in 2 weeks with anticipation of discharge at that time Electronic Signature(s) Signed: 11/17/2017 11:19:27 AM By: [name] Entered By: [name] on 11/17/2017 11:19:27 Autumn Gathers I.  ([id number]) -------------------------------------------------------------------------------- Physician Orders Details Patient Name: Autumn Gathers I. Date of Service: 11/17/2017 10:15 AM Medical Record Number: [id number] Patient Account Number: [id number] Date of Birth/Sex: 02-23-1946 (72 y.o. F) Treating RN: Ahmed Prima Primary Care Provider: FITZGERALD, DAVID Other Clinician: Referring Provider: FITZGERALD, DAVID Treating Provider/Extender: [name] Weeks in  Treatment: 6 Verbal / Phone Orders: Yes Clinician: Pinkerton, Debi Read Back and Verified: Yes Diagnosis Coding Wound Cleansing Wound #1 Right,Lateral Malleolus o Clean wound with Normal Saline. o Cleanse wound with mild soap and water o May Shower, gently pat wound dry prior to applying new dressing. Anesthetic (add to Medication List) Wound #1 Right,Lateral Malleolus o Topical Lidocaine 4% cream applied to wound bed prior to debridement (In Clinic Only). Skin Barriers/Peri-Wound Care Wound #1 Right,Lateral Malleolus o Skin Prep Primary Wound Dressing o Santyl Ointment Secondary Dressing Wound #1 Right,Lateral Malleolus o Boardered Foam Dressing Dressing Change Frequency Wound #1 Right,Lateral Malleolus o Change dressing every day. Follow-up Appointments o Return Appointment in 2 weeks. Off-Loading Wound #1 Right,Lateral Malleolus o Other: - keep pressure off of affected area Additional Orders / Instructions Wound #1 Right,Lateral Malleolus o Increase protein intake. Patient Medications Allergies: Iodinated Contrast- Oral and IV Dye, metrizamide Notifications Medication Indication Start End lidocaine DOSE 1 - topical 4 % cream - 1 cream topical Autumn [name], Autumn I. ([id number]) Electronic Signature(s) Signed: 11/17/2017 4:46:33 PM By: [name] Signed: 11/17/2017 5:08:35 PM By: [name] Entered By: [name] on 11/17/2017 10:42:04 Autumn Gathers I.  ([id number]) -------------------------------------------------------------------------------- Prescription 11/17/2017 Patient Name: Autumn [name] Provider: [name] NP Date of Birth: 08-Jan-1946 NPI#: [id number] Sex: F DEA#: [id number] Phone #: [phone] License #: Patient Address: [place] [address] [facility] [place], [place] [address] [address] [place], [place] [address] [phone] Allergies Iodinated Contrast- Oral and IV Dye metrizamide Medication Medication: Route: Strength: Form: lidocaine topical 4% cream Class: TOPICAL LOCAL ANESTHETICS Dose: Frequency / Time: Indication: 1 1 cream topical Number of Refills: Number of Units: 0 Generic Substitution: Start Date: End Date: Administered at Substitution Permitted Facility: Yes Time Administered: Time Discontinued: Note to Pharmacy: Signature(s): Date(s): Electronic Signature(s) Signed: 11/17/2017 4:46:33 PM By: [name] Signed: 11/17/2017 5:08:35 PM By: Saverio Danker I. ([id number]) Entered By: [name] on 11/17/2017 10:42:05 Autumn Hua I. ([id number]) --------------------------------------------------------------------------------  Problem List Details Patient Name: Autumn Gathers I. Date of Service: 11/17/2017 10:15 AM Medical Record Number: [id number] Patient Account Number: [id number] Date of Birth/Sex: 1945/11/02 (72 y.o. 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 ([id number]) -------------------------------------------------------------------------------- Progress Note Details Patient Name: Autumn Gathers I. Date of Service: 11/17/2017 10:15 AM Medical Record Number: [id number] Patient Account Number: [id number] Date of Birth/Sex: 08/17/1945 (72 y.o. F) Treating RN: Ahmed Prima Primary Care Provider: FITZGERALD, DAVID Other Clinician: Referring Provider: FITZGERALD, DAVID Treating Provider/Extender: Cathie Olden in Treatment: 6 Subjective Chief Complaint Information obtained from Patient chronic right lateral malleolus ulcer History of Present Illness (HPI) 10/06/17-She is here for initial evaluation of a chronic right lateral malleolus ulcer. She provides a vague history regarding this ulcer. The ulcer has been present for greater than 6 months, although she cannot specify anything more than that. She denies any known trauma or precipitating event other than scratching. She admits to seeing dermatology (does not remember who; there are no records in epic system) several months ago  at which time they removed a scab to reveal the current ulceration; she says at one time they considered performing a biopsy but did not perform because it was "getting better". According to her and her husband the ulcer is essentially unchanged, has never healed, never been worse. She reports pain only with squeezing pressure, otherwise denies pain. She reports minimal to no drainage; has been applying antibiotic ointment/cream. She carries a history of pulmonary sarcoidosis; non-diabetic; non-smoker; chronic immunosuppression with prednisone 10/20/17-She is here for follow-up evaluation for chronic right lateral malleolus ulcer. There is essentially no change. Did review you and see records. 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 She is voicing no complaints or concerns. We will transition to collagen product and evaluate for any improvement. 11/03/17-She is seen in follow-up evaluation for chronic right lateral malleolus ulcer. After selective debridement of dried exudate and epidermis it appears that most of the wound is epithelialized with a small area of partial-thickness opening to the proximal aspect; the other alternative to my assessment is that there is an extremely dry and fibrotic base, not yielding to sharp debridement. We will trial since her for 2 weeks, as this will not damage tissue there is epithelialized but may contribute and softening and/or enzymatically debriding any fibrotic/nonviable tissue. 11/17/17-She is seen in follow-up vibration for chronic right lateral malleolus ulcer. There is no evidence of drainage; she and her spouse deny any evidence of drainage over the last 2 weeks. There is continued improvement and pain. We will continue with 2 additional weeks of Santyl; I do believe she is healed but will continue with treatment in the case that there is nonvisible opening. She will follow-up in 2 weeks with anticipation of discharge at that time Patient History Information obtained from Patient. Family History Cancer - Mother, No family history of Diabetes, Heart Disease, Hereditary Spherocytosis, Hypertension, Kidney Disease, Lung Disease, Seizures, Stroke, Thyroid Problems, Tuberculosis. Social History Former smoker - 20 years, Marital Status - Married, Alcohol Use - Rarely, Drug Use - No History, Caffeine Use - Rarely. Medical And Surgical History Notes Respiratory sarcoidosis Oncologic ovarian cancer - ovaries removed Seres, Autumn I. ([id number]) Review of Systems (ROS) Constitutional Symptoms (General Health) Denies complaints or symptoms of Fatigue, Fever, Chills. Respiratory Denies complaints or symptoms of  Shortness of Breath. Cardiovascular Denies complaints or symptoms of Chest pain, LE edema. Objective Constitutional Vitals Time Taken: 10:23 AM, Height: 68 in, Weight: 121 lbs, BMI: 18.4, Temperature: 98.1 F, Pulse: 81 bpm, Respiratory Rate: 18 breaths/min, Blood Pressure: 163/65 mmHg. Integumentary (Hair, Skin) Wound #1 status is Open. Original cause of wound was Gradually Appeared. The wound is located on the Right,Lateral Malleolus. The wound measures 0.1cm length x 0.1cm width x 0.2cm depth; 0.008cm^2 area and 0.002cm^3 volume. There is Fat Layer (Subcutaneous Tissue) Exposed exposed. There is no tunneling or undermining noted. There is a medium amount of serosanguineous drainage noted. The wound margin is flat and intact. There is medium (34-66%) pink granulation within the wound bed. There is a medium (34-66%) amount of necrotic tissue within the wound bed including Adherent Slough. The periwound skin appearance exhibited: Callus, Induration, Ecchymosis. The periwound skin appearance did not exhibit: Crepitus, Excoriation, Rash, Scarring, Dry/Scaly, Maceration, Atrophie Blanche, Cyanosis, Hemosiderin Staining, Mottled, Pallor, Rubor, Erythema. Periwound temperature was noted as No Abnormality. The periwound has tenderness on palpation. 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 Cleanse wound with mild soap and water May Shower, gently pat wound dry prior to applying new dressing. Anesthetic (add to Medication List): Autumn [name], Autumn I. ([id number]) Wound #1 Right,Lateral Malleolus: Topical Lidocaine 4% cream applied to wound bed prior to debridement (In Clinic Only). Skin Barriers/Peri-Wound Care: Wound #1 Right,Lateral Malleolus: Skin Prep Primary Wound Dressing: Santyl Ointment Secondary Dressing: Wound #1  Right,Lateral Malleolus: Boardered Foam Dressing Dressing Change Frequency: Wound #1 Right,Lateral Malleolus: Change dressing every day. Follow-up Appointments: Return Appointment in 2 weeks. Off-Loading: Wound #1 Right,Lateral Malleolus: Other: - keep pressure off of affected area Additional Orders / Instructions: Wound #1 Right,Lateral Malleolus: Increase protein intake. The following medication(s) was prescribed: lidocaine topical 4 % cream 1 1 cream topical was prescribed at facility Electronic Signature(s) Signed: 11/17/2017 11:20:37 AM By: [name] Entered By: [name] on 11/17/2017 11:20:37 Autumn Gathers I. ([id number]) -------------------------------------------------------------------------------- ROS/PFSH Details Patient Name: Autumn Gathers I. Date of Service: 11/17/2017 10:15 AM Medical Record Number: [id number] Patient Account Number: [id number] Date of Birth/Sex: [birth year] (72 y.o. F) Treating RN: Ahmed Prima Primary Care Provider: FITZGERALD, DAVID Other Clinician: Referring Provider: FITZGERALD, DAVID Treating Provider/Extender: Cathie Olden in Treatment: 6 Information Obtained From Patient Wound History Do you currently have one or more open woundso Yes How many open wounds do you currently haveo 1 Approximately how long have you had your woundso 3 or 4 months How have you been treating your wound(s) until nowo mupirocin Has your wound(s) ever healed and then re-openedo No Have you had any lab work done in the past montho Yes Who ordered the lab work doneo PCP Have you tested positive for an antibiotic resistant organism (MRSA, VRE)o No Have you tested positive for osteomyelitis (bone infection)o No Have you had any tests for circulation on your legso No Constitutional Symptoms (General Health) Complaints and Symptoms: Negative for: Fatigue; Fever; Chills Respiratory Complaints and Symptoms: Negative for: Shortness of Breath Medical  History: Positive for: Chronic Obstructive Pulmonary Disease (COPD) Negative for: Aspiration; Asthma; Pneumothorax; Sleep Apnea; Tuberculosis Past Medical History Notes: sarcoidosis Cardiovascular Complaints and Symptoms: Negative for: Chest pain; LE edema Medical History: Positive for: Hypertension Negative for: Angina; Arrhythmia; Congestive Heart Failure; Coronary Artery Disease; Deep Vein Thrombosis; Hypotension; Myocardial Infarction; Peripheral Arterial Disease; Peripheral Venous Disease; Phlebitis; Vasculitis Eyes Medical History: Positive for: Glaucoma Negative for: Cataracts; Optic Neuritis Ear/Nose/Mouth/Throat Autumn [name], Autumn [name] ([id number]) Medical History: Negative for: Chronic sinus problems/congestion; Middle ear problems Hematologic/Lymphatic Medical History: Negative for: Anemia; Hemophilia; Human Immunodeficiency Virus; Lymphedema; Sickle Cell Disease Gastrointestinal Medical History: Negative for: Cirrhosis ; Colitis; Crohnos; Hepatitis A; Hepatitis B; Hepatitis C Endocrine Medical History: Negative for: Type I Diabetes; Type II Diabetes Genitourinary Medical History: Negative for: End Stage Renal Disease Immunological Medical History: Negative for: Lupus Erythematosus; Raynaudos; Scleroderma Integumentary (Skin) Medical History: Negative for: History of Burn; History of pressure wounds Musculoskeletal Medical History: Negative for: Gout; Rheumatoid Arthritis; Osteoarthritis; Osteomyelitis Neurologic Medical History: Negative for: Dementia; Neuropathy; Quadriplegia; Paraplegia; Seizure Disorder Oncologic Medical History: Positive for: Received Chemotherapy - 2011 Negative for: Received Radiation Past Medical History Notes: ovarian cancer - ovaries removed Psychiatric Medical History: Negative for: Anorexia/bulimia; Confinement Anxiety HBO Extended History Items Eyes: Glaucoma Autumn [name], Autumn I. ([id number]) Immunizations Pneumococcal  Vaccine: Received Pneumococcal Vaccination: Yes Immunization Notes: up to date Implantable Devices Family and Social History Cancer: Yes - Mother; Diabetes: No; Heart Disease: No; Hereditary Spherocytosis: No; Hypertension: No; Kidney Disease: No; Lung  Disease: No; Seizures: No; Stroke: No; Thyroid Problems: No; Tuberculosis: No; Former smoker - 39 years; Marital Status - Married; Alcohol Use: Rarely; Drug Use: No History; Caffeine Use: Rarely; Financial Concerns: No; Food, Clothing or Shelter Needs: No; Support System Lacking: No; Transportation Concerns: No; Advanced Directives: No; Patient does not want information on Advanced Directives Physician Affirmation I have reviewed and agree with the above information. Electronic Signature(s) Signed: 11/17/2017 4:46:33 PM By: [name] Signed: 11/17/2017 5:08:35 PM By: [name] Entered By: [name] on 11/17/2017 11:20:16 Autumn Gathers I. ([id number]) -------------------------------------------------------------------------------- SuperBill Details Patient Name: Autumn Gathers I. Date of Service: 11/17/2017 Medical Record Number: [id number] Patient Account Number: [id number] Date of Birth/Sex: 1945-05-30 (72 y.o. 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

## 2017-11-28 NOTE — Progress Notes (Signed)
BALBINA, DEPACE ([id number]) Visit Report for 11/17/2017 Arrival Information Details Patient Name: Autumn [name], Autumn [name] I. Date of Service: 11/17/2017 10:15 AM Medical Record Number: [id number] Patient Account Number: [id number] Date of Birth/Sex: 01-Jan-1946 (72 y.o. F) Treating RN: Montey Hora Primary Care Lenna Hagarty: FITZGERALD, DAVID Other Clinician: Referring Deontae Robson: FITZGERALD, DAVID Treating Caliyah Sieh/Extender: Cathie Olden in Treatment: 6 Visit Information History Since Last Visit Added or deleted any medications: No Patient Arrived: Kasandra Knudsen Any new allergies or adverse reactions: No Arrival Time: 10:21 Had a fall or experienced change in No Accompanied By: husband activities of daily living that may affect Transfer Assistance: None risk of falls: Patient Identification Verified: Yes Signs or symptoms of abuse/neglect since last visito No Secondary Verification Process Yes Hospitalized since last visit: No Completed: Implantable device outside of the clinic excluding No Patient Has Alerts: Yes cellular tissue based products placed in the center Patient Alerts: ABI [place] BILATERAL since last visit: >220 Has Dressing in Place as Prescribed: Yes Pain Present Now: No Electronic Signature(s) Signed: 11/17/2017 4:27:39 PM By: Montey Hora Entered By: Montey Hora on 11/17/2017 10:23:25 Autumn Gathers I. ([id number]) -------------------------------------------------------------------------------- Clinic Level of Care Assessment Details Patient Name: Autumn Gathers I. Date of Service: 11/17/2017 10:15 AM Medical Record Number: [id number] Patient Account Number: [id number] Date of Birth/Sex: 1945/06/24 (72 y.o. F) Treating RN: Ahmed Prima Primary Care Thaine Garriga: FITZGERALD, DAVID Other Clinician: Referring Mehr Depaoli: FITZGERALD, DAVID Treating Lexa Coronado/Extender: Cathie Olden in Treatment: 6 Clinic Level of Care Assessment Items TOOL 4 Quantity Score X - Use when only an  EandM is performed on FOLLOW-UP visit 1 0 ASSESSMENTS - Nursing Assessment / Reassessment X - Reassessment of Co-morbidities (includes updates in patient status) 1 10 X- 1 5 Reassessment of Adherence to Treatment Plan ASSESSMENTS - Wound and Skin Assessment / Reassessment X - Simple Wound Assessment / Reassessment - one wound 1 5 []  - 0 Complex Wound Assessment / Reassessment - multiple wounds []  - 0 Dermatologic / Skin Assessment (not related to wound area) ASSESSMENTS - Focused Assessment []  - Circumferential Edema Measurements - multi extremities 0 []  - 0 Nutritional Assessment / Counseling / Intervention []  - 0 Lower Extremity Assessment (monofilament, tuning fork, pulses) []  - 0 Peripheral Arterial Disease Assessment (using hand held doppler) ASSESSMENTS - Ostomy and/or Continence Assessment and Care []  - Incontinence Assessment and Management 0 []  - 0 Ostomy Care Assessment and Management (repouching, etc.) PROCESS - Coordination of Care X - Simple Patient / Family Education for ongoing care 1 15 []  - 0 Complex (extensive) Patient / Family Education for ongoing care []  - 0 Staff obtains Programmer, systems, Records, Test Results / Process Orders []  - 0 Staff telephones HHA, Nursing Homes / Clarify orders / etc []  - 0 Routine Transfer to another Facility (non-emergent condition) []  - 0 Routine Hospital Admission (non-emergent condition) []  - 0 New Admissions / Biomedical engineer / Ordering NPWT, Apligraf, etc. []  - 0 Emergency Hospital Admission (emergent condition) X- 1 10 Simple Discharge Coordination Carey, Danelia I. ([id number]) []  - 0 Complex (extensive) Discharge Coordination PROCESS - Special Needs []  - Pediatric / Minor Patient Management 0 []  - 0 Isolation Patient Management []  - 0 Hearing / Language / Visual special needs []  - 0 Assessment of Community assistance (transportation, D/C planning, etc.) []  - 0 Additional assistance / Altered mentation []  -  0 Support Surface(s) Assessment (bed, cushion, seat, etc.) INTERVENTIONS - Wound Cleansing / Measurement X - Simple Wound Cleansing - one wound 1 5 []  - 0 Complex Wound  Cleansing - multiple wounds X- 1 5 Wound Imaging (photographs - any number of wounds) []  - 0 Wound Tracing (instead of photographs) X- 1 5 Simple Wound Measurement - one wound []  - 0 Complex Wound Measurement - multiple wounds INTERVENTIONS - Wound Dressings X - Small Wound Dressing one or multiple wounds 1 10 []  - 0 Medium Wound Dressing one or multiple wounds []  - 0 Large Wound Dressing one or multiple wounds X- 1 5 Application of Medications - topical []  - 0 Application of Medications - injection INTERVENTIONS - Miscellaneous []  - External ear exam 0 []  - 0 Specimen Collection (cultures, biopsies, blood, body fluids, etc.) []  - 0 Specimen(s) / Culture(s) sent or taken to Lab for analysis []  - 0 Patient Transfer (multiple staff / Civil Service fast streamer / Similar devices) []  - 0 Simple Staple / Suture removal (25 or less) []  - 0 Complex Staple / Suture removal (26 or more) []  - 0 Hypo / Hyperglycemic Management (close monitor of Blood Glucose) []  - 0 Ankle / Brachial Index (ABI) - do not check if billed separately X- 1 5 Vital Signs Cardinal, Audryana I. ([id number]) Has the patient been seen at the hospital within the last three years: Yes Total Score: 80 Level Of Care: New/Established - Level 3 Electronic Signature(s) Signed: 11/17/2017 4:46:33 PM By: [name] Entered By: [name] on 11/17/2017 11:18:25 Autumn Gathers I. ([id number]) -------------------------------------------------------------------------------- Encounter Discharge Information Details Patient Name: Autumn Gathers I. Date of Service: 11/17/2017 10:15 AM Medical Record Number: [id number] Patient Account Number: [id number] Date of Birth/Sex: Jan 07, 1946 (72 y.o. F) Treating RN: Roger Shelter Primary Care Stacey Maura: FITZGERALD, DAVID  Other Clinician: Referring Newton Frutiger: FITZGERALD, DAVID Treating Vauda Salvucci/Extender: Cathie Olden in Treatment: 6 Encounter Discharge Information Items Discharge Condition: Stable Ambulatory Status: Walker Discharge Destination: Home Transportation: Private Auto Accompanied By: husband Schedule Follow-up Appointment: Yes Clinical Summary of Care: Electronic Signature(s) Signed: 11/17/2017 11:54:11 AM By: Roger Shelter Entered By: Roger Shelter on 11/17/2017 10:51:35 Autumn Gathers I. ([id number]) -------------------------------------------------------------------------------- Lower Extremity Assessment Details Patient Name: Autumn Gathers I. Date of Service: 11/17/2017 10:15 AM Medical Record Number: [id number] Patient Account Number: [id number] Date of Birth/Sex: Dec 28, 1945 (72 y.o. F) Treating RN: Montey Hora Primary Care Britton Perkinson: FITZGERALD, DAVID Other Clinician: Referring Jenise Iannelli: FITZGERALD, DAVID Treating Takumi Din/Extender: [name] Weeks in Treatment: 6 Vascular Assessment Pulses: Dorsalis Pedis Palpable: [Right:Yes] Posterior Tibial Extremity colors, hair growth, and conditions: Extremity Color: [Right:Normal] Hair Growth on Extremity: [Right:No] Temperature of Extremity: [Right:Warm] Capillary Refill: [Right:< 3 seconds] Toe Nail Assessment Left: Right: Thick: Yes Discolored: No Deformed: No Improper Length and Hygiene: Yes Electronic Signature(s) Signed: 11/17/2017 4:27:39 PM By: Montey Hora Entered By: Montey Hora on 11/17/2017 10:29:20 Horseman, [name] ([id number]) -------------------------------------------------------------------------------- Multi Wound Chart Details Patient Name: Autumn Gathers I. Date of Service: 11/17/2017 10:15 AM Medical Record Number: [id number] Patient Account Number: [id number] Date of Birth/Sex: [birth year] (72 y.o. F) Treating RN: Ahmed Prima Primary Care Latisa Belay: FITZGERALD, DAVID Other  Clinician: Referring Tae Robak: FITZGERALD, DAVID Treating Vondell Babers/Extender: [name] Weeks in Treatment: 6 Vital Signs Height(in): 68 Pulse(bpm): 45 Weight(lbs): 121 Blood Pressure(mmHg): 163/65 Body Mass Index(BMI): 18 Temperature(F): 98.1 Respiratory Rate 18 (breaths/min): Photos: [N/A:N/A] Wound Location: Right Malleolus - Lateral N/A N/A Wounding Event: Gradually Appeared N/A N/A Primary Etiology: Venous Leg Ulcer N/A N/A Comorbid History: Glaucoma, Chronic N/A N/A Obstructive Pulmonary Disease (COPD), Hypertension, Received Chemotherapy Date Acquired: 04/12/2017 N/A N/A Weeks of Treatment: 6 N/A N/A Wound Status: Open N/A N/A Measurements L x W x D 0.1x0.1x0.2 N/A  N/A (cm) Area (cm) : 0.008 N/A N/A Volume (cm) : 0.002 N/A N/A % Reduction in Area: 74.20% N/A N/A % Reduction in Volume: 66.70% N/A N/A Classification: Full Thickness Without N/A N/A Exposed Support Structures Exudate Amount: Medium N/A N/A Exudate Type: Serosanguineous N/A N/A Exudate Color: red, brown N/A N/A Wound Margin: Flat and Intact N/A N/A Granulation Amount: Medium (34-66%) N/A N/A Granulation Quality: Pink N/A N/A Necrotic Amount: Medium (34-66%) N/A N/A Exposed Structures: Fat Layer (Subcutaneous N/A N/A Tissue) Exposed: Yes Fascia: No Gabrys, Adaijah I. ([id number]) Tendon: No Muscle: No Joint: No Bone: No Epithelialization: None N/A N/A Periwound Skin Texture: Induration: Yes N/A N/A Callus: Yes Excoriation: No Crepitus: No Rash: No Scarring: No Periwound Skin Moisture: Maceration: No N/A N/A Dry/Scaly: No Periwound Skin Color: Ecchymosis: Yes N/A N/A Atrophie Blanche: No Cyanosis: No Erythema: No Hemosiderin Staining: No Mottled: No Pallor: No Rubor: No Temperature: No Abnormality N/A N/A Tenderness on Palpation: Yes N/A N/A Wound Preparation: Ulcer Cleansing: N/A N/A Rinsed/Irrigated with Saline Topical Anesthetic Applied: Other: lidocaine 4% Treatment  Notes Wound #1 (Right, Lateral Malleolus) 1. Cleansed with: Clean wound with Normal Saline 2. Anesthetic Topical Lidocaine 4% cream to wound bed prior to debridement 4. Dressing Applied: Santyl Ointment 5. Secondary Dressing Applied Bordered Foam Dressing Electronic Signature(s) Signed: 11/17/2017 11:18:14 AM By: [name] Entered By: [name] on 11/17/2017 11:18:14 Autumn Gathers I. ([id number]) -------------------------------------------------------------------------------- [place] Details Patient Name: Autumn Gathers I. Date of Service: 11/17/2017 10:15 AM Medical Record Number: [id number] Patient Account Number: [id number] Date of Birth/Sex: Feb 19, 1946 (72 y.o. F) Treating RN: Ahmed Prima Primary Care Loic Hobin: FITZGERALD, DAVID Other Clinician: Referring Nader Boys: FITZGERALD, DAVID Treating Zorina Mallin/Extender: Cathie Olden in Treatment: 6 Active Inactive ` Abuse / Safety / Falls / Self Care Management Nursing Diagnoses: Potential for falls Goals: Patient will not experience any injury related to falls Date Initiated: 10/06/2017 Target Resolution Date: 01/14/2018 Goal Status: Active Interventions: Assess Activities of Daily Living upon admission and as needed Assess fall risk on admission and as needed Assess: immobility, friction, shearing, incontinence upon admission and as needed Assess impairment of mobility on admission and as needed per policy Assess personal safety and home safety (as indicated) on admission and as needed Notes: ` Nutrition Nursing Diagnoses: Imbalanced nutrition Potential for alteratiion in Nutrition/Potential for imbalanced nutrition Goals: Patient/caregiver agrees to and verbalizes understanding of need to use nutritional supplements and/or vitamins as prescribed Date Initiated: 10/06/2017 Target Resolution Date: 01/14/2018 Goal Status: Active Interventions: Assess patient nutrition upon admission and as  needed per policy Notes: ` Orientation to the Wound Care Program Nursing Diagnoses: Knowledge deficit related to the wound healing center program LADORIS, LYTHGOE I. ([id number]) Goals: Patient/caregiver will verbalize understanding of the [facility] Date Initiated: 10/06/2017 Target Resolution Date: 10/15/2017 Goal Status: Active Interventions: Provide education on orientation to the wound center Notes: ` Pain, Acute or Chronic Nursing Diagnoses: Pain, acute or chronic: actual or potential Potential alteration in comfort, pain Goals: Patient/caregiver will verbalize adequate pain control between visits Date Initiated: 10/06/2017 Target Resolution Date: 01/14/2018 Goal Status: Active Interventions: Complete pain assessment as per visit requirements Encourage patient to take pain medications as prescribed Notes: ` Wound/Skin Impairment Nursing Diagnoses: Impaired tissue integrity Knowledge deficit related to ulceration/compromised skin integrity Goals: Ulcer/skin breakdown will have a volume reduction of 80% by week 12 Date Initiated: 10/06/2017 Target Resolution Date: 01/07/2018 Goal Status: Active Interventions: Assess patient/caregiver ability to perform ulcer/skin care regimen upon admission and as needed Assess  ulceration(s) every visit Notes: Electronic Signature(s) Signed: 11/17/2017 4:46:33 PM By: [name] Entered By: [name] on 11/17/2017 10:32:32 Autumn Gathers I. ([id number]) -------------------------------------------------------------------------------- Pain Assessment Details Patient Name: Autumn Gathers I. Date of Service: 11/17/2017 10:15 AM Medical Record Number: [id number] Patient Account Number: [id number] Date of Birth/Sex: 09-Feb-1946 (72 y.o. F) Treating RN: Montey Hora Primary Care Debanhi Blaker: FITZGERALD, DAVID Other Clinician: Referring Orissa Arreaga: FITZGERALD, DAVID Treating Swati Granberry/Extender: [name] Weeks in  Treatment: 6 Active Problems Location of Pain Severity and Description of Pain Patient Has Paino No Site Locations Pain Management and Medication Current Pain Management: Electronic Signature(s) Signed: 11/17/2017 4:27:39 PM By: Montey Hora Entered By: Montey Hora on 11/17/2017 10:23:30 Autumn Gathers I. ([id number]) -------------------------------------------------------------------------------- Patient/Caregiver Education Details Patient Name: Autumn Gathers I. Date of Service: 11/17/2017 10:15 AM Medical Record Number: [id number] Patient Account Number: [id number] Date of Birth/Gender: [birth year] (72 y.o. F) Treating RN: Roger Shelter Primary Care Physician: FITZGERALD, DAVID Other Clinician: Referring Physician: FITZGERALD, DAVID Treating Physician/Extender: Cathie Olden in Treatment: 6 Education Assessment Education Provided To: Patient Education Topics Provided Wound/Skin Impairment: Handouts: Caring for Your Ulcer Methods: Explain/Verbal Responses: State content correctly Electronic Signature(s) Signed: 11/17/2017 11:54:11 AM By: Roger Shelter Entered By: Roger Shelter on 11/17/2017 10:51:45 Henriquez, [name] ([id number]) -------------------------------------------------------------------------------- Wound Assessment Details Patient Name: Autumn Gathers I. Date of Service: 11/17/2017 10:15 AM Medical Record Number: [id number] Patient Account Number: [id number] Date of Birth/Sex: February 12, 1946 (72 y.o. F) Treating RN: Montey Hora Primary Care [place] Oborn: FITZGERALD, DAVID Other Clinician: Referring Icesis Renn: FITZGERALD, DAVID Treating Kemper Heupel/Extender: [name] Weeks in Treatment: 6 Wound Status Wound Number: 1 Primary Venous Leg Ulcer Etiology: Wound Location: Right Malleolus - Lateral Wound Open Wounding Event: Gradually Appeared Status: Date Acquired: 04/12/2017 Comorbid Glaucoma, Chronic Obstructive Pulmonary Weeks Of Treatment: 6 History:  Disease (COPD), Hypertension, Received Clustered Wound: No Chemotherapy Photos Photo Uploaded By: Montey Hora on 11/17/2017 10:48:45 Wound Measurements Length: (cm) 0.1 Width: (cm) 0.1 Depth: (cm) 0.2 Area: (cm) 0.008 Volume: (cm) 0.002 % Reduction in Area: 74.2% % Reduction in Volume: 66.7% Epithelialization: None Tunneling: No Undermining: No Wound Description Full Thickness Without Exposed Support Classification: Structures Wound Margin: Flat and Intact Exudate Medium Amount: Exudate Type: Serosanguineous Exudate Color: red, brown Foul Odor After Cleansing: No Slough/Fibrino Yes Wound Bed Granulation Amount: Medium (34-66%) Exposed Structure Granulation Quality: Pink Fascia Exposed: No Necrotic Amount: Medium (34-66%) Fat Layer (Subcutaneous Tissue) Exposed: Yes Necrotic Quality: Adherent Slough Tendon Exposed: No Muscle Exposed: No Joint Exposed: No Bone Exposed: No Dimario, Estalene I. ([id number]) Periwound Skin Texture Texture Color No Abnormalities Noted: No No Abnormalities Noted: No Callus: Yes Atrophie Blanche: No Crepitus: No Cyanosis: No Excoriation: No Ecchymosis: Yes Induration: Yes Erythema: No Rash: No Hemosiderin Staining: No Scarring: No Mottled: No Pallor: No Moisture Rubor: No No Abnormalities Noted: No Dry / Scaly: No Temperature / Pain Maceration: No Temperature: No Abnormality Tenderness on Palpation: Yes Wound Preparation Ulcer Cleansing: Rinsed/Irrigated with Saline Topical Anesthetic Applied: Other: lidocaine 4%, Treatment Notes Wound #1 (Right, Lateral Malleolus) 1. Cleansed with: Clean wound with Normal Saline 2. Anesthetic Topical Lidocaine 4% cream to wound bed prior to debridement 4. Dressing Applied: Santyl Ointment 5. Secondary Dressing Applied Bordered Foam Dressing Electronic Signature(s) Signed: 11/17/2017 4:27:39 PM By: Montey Hora Entered By: Montey Hora on 11/17/2017 10:28:59 Autumn Gathers  I. ([id number]) -------------------------------------------------------------------------------- Vitals Details Patient Name: Autumn Gathers I. Date of Service: 11/17/2017 10:15 AM Medical Record Number: [id number] Patient Account Number: [id number] Date of Birth/Sex: 1945-07-13 (72 y.o. F) Treating RN: Marjory Lies,  [facility] Primary Care Rafel Garde: FITZGERALD, DAVID Other Clinician: Referring Lilia Letterman: FITZGERALD, DAVID Treating Merlyn Bollen/Extender: [name] Weeks in Treatment: 6 Vital Signs Time Taken: 10:23 Temperature (F): 98.1 Height (in): 68 Pulse (bpm): 81 Weight (lbs): 121 Respiratory Rate (breaths/min): 18 Body Mass Index (BMI): 18.4 Blood Pressure (mmHg): 163/65 Reference Range: 80 - 120 mg / dl Electronic Signature(s) Signed: 11/17/2017 4:27:39 PM By: Montey Hora Entered By: Montey Hora on 11/17/2017 10:24:50

## 2017-11-29 ENCOUNTER — Telehealth: Payer: Self-pay

## 2017-11-29 MED ORDER — LIDOCAINE 5 % EX OINT: 1.0000 "application " | TOPICAL_OINTMENT | Freq: Four times a day (QID) | CUTANEOUS | 2 refills | Status: AC | PRN

## 2017-11-29 NOTE — Telephone Encounter (Signed)
Spoke with patient to let her know that Dr Holley Raring has escribed Lidocaine cream to [facility].

## 2017-11-29 NOTE — Telephone Encounter (Signed)
Pt called and stated her and Dr Holley Raring discussed calling in some Lidocaine cream for her pain, She want's to know if he was calling in prescription? If so her pharmacy is [facility]

## 2017-12-01 ENCOUNTER — Encounter: Payer: Medicare Other | Admitting: Nurse Practitioner

## 2017-12-01 DIAGNOSIS — L97311 Non-pressure chronic ulcer of right ankle limited to breakdown of skin: Secondary | ICD-10-CM | POA: Diagnosis not present

## 2017-12-10 NOTE — Progress Notes (Signed)
MORIYA, MITCHELL ([id number]) Visit Report for 12/01/2017 Arrival Information Details Patient Name: LILLIAN, BALLESTER I. Date of Service: 12/01/2017 1:00 PM Medical Record Number: [id number] Patient Account Number: [id number] Date of Birth/Sex: [birth year] (72 y.o. F) Treating RN: Montey Hora Primary Care Aurther Harlin: Harrel Lemon Other Clinician: Referring Preeti Winegardner: FITZGERALD, DAVID Treating Markesia Crilly/Extender: Cathie Olden in Treatment: 8 Visit Information History Since Last Visit Added or deleted any medications: No Patient Arrived: Kasandra Knudsen Any new allergies or adverse reactions: No Arrival Time: 12:53 Had a fall or experienced change in No Accompanied By: spouse activities of daily living that may affect Transfer Assistance: None risk of falls: Patient Identification Verified: Yes Signs or symptoms of abuse/neglect since last visito No Secondary Verification Process Yes Hospitalized since last visit: No Completed: Implantable device outside of the clinic excluding No Patient Has Alerts: Yes cellular tissue based products placed in the center Patient Alerts: ABI [place] BILATERAL since last visit: >220 Has Dressing in Place as Prescribed: Yes Pain Present Now: No Electronic Signature(s) Signed: 12/02/2017 4:18:40 PM By: Montey Hora Entered By: Montey Hora on 12/01/2017 12:53:19 Duprey, [name] ([id number]) -------------------------------------------------------------------------------- Clinic Level of Care Assessment Details Patient Name: [name] Date of Service: 12/01/2017 1:00 PM Medical Record Number: [id number] Patient Account Number: [id number] Date of Birth/Sex: [birth year] (72 y.o. F) Treating RN: Ahmed Prima Primary Care Maylen Waltermire: Harrel Lemon Other Clinician: Referring Elizzie Westergard: FITZGERALD, DAVID Treating Cannon Arreola/Extender: Cathie Olden in Treatment: 8 Clinic Level of Care Assessment Items TOOL 4 Quantity Score X - Use when only an EandM  is performed on FOLLOW-UP visit 1 0 ASSESSMENTS - Nursing Assessment / Reassessment X - Reassessment of Co-morbidities (includes updates in patient status) 1 10 X- 1 5 Reassessment of Adherence to Treatment Plan ASSESSMENTS - Wound and Skin Assessment / Reassessment X - Simple Wound Assessment / Reassessment - one wound 1 5 []  - 0 Complex Wound Assessment / Reassessment - multiple wounds []  - 0 Dermatologic / Skin Assessment (not related to wound area) ASSESSMENTS - Focused Assessment []  - Circumferential Edema Measurements - multi extremities 0 []  - 0 Nutritional Assessment / Counseling / Intervention []  - 0 Lower Extremity Assessment (monofilament, tuning fork, pulses) []  - 0 Peripheral Arterial Disease Assessment (using hand held doppler) ASSESSMENTS - Ostomy and/or Continence Assessment and Care []  - Incontinence Assessment and Management 0 []  - 0 Ostomy Care Assessment and Management (repouching, etc.) PROCESS - Coordination of Care X - Simple Patient / Family Education for ongoing care 1 15 []  - 0 Complex (extensive) Patient / Family Education for ongoing care []  - 0 Staff obtains Programmer, systems, Records, Test Results / Process Orders []  - 0 Staff telephones HHA, Nursing Homes / Clarify orders / etc []  - 0 Routine Transfer to another Facility (non-emergent condition) []  - 0 Routine Hospital Admission (non-emergent condition) []  - 0 New Admissions / Biomedical engineer / Ordering NPWT, Apligraf, etc. []  - 0 Emergency Hospital Admission (emergent condition) X- 1 10 Simple Discharge Coordination Pucci, Rosemarie I. ([id number]) []  - 0 Complex (extensive) Discharge Coordination PROCESS - Special Needs []  - Pediatric / Minor Patient Management 0 []  - 0 Isolation Patient Management []  - 0 Hearing / Language / Visual special needs []  - 0 Assessment of Community assistance (transportation, D/C planning, etc.) []  - 0 Additional assistance / Altered mentation []  -  0 Support Surface(s) Assessment (bed, cushion, seat, etc.) INTERVENTIONS - Wound Cleansing / Measurement X - Simple Wound Cleansing - one wound 1 5 []  - 0 Complex Wound  Cleansing - multiple wounds X- 1 5 Wound Imaging (photographs - any number of wounds) []  - 0 Wound Tracing (instead of photographs) []  - 0 Simple Wound Measurement - one wound []  - 0 Complex Wound Measurement - multiple wounds INTERVENTIONS - Wound Dressings []  - Small Wound Dressing one or multiple wounds 0 []  - 0 Medium Wound Dressing one or multiple wounds []  - 0 Large Wound Dressing one or multiple wounds []  - 0 Application of Medications - topical []  - 0 Application of Medications - injection INTERVENTIONS - Miscellaneous []  - External ear exam 0 []  - 0 Specimen Collection (cultures, biopsies, blood, body fluids, etc.) []  - 0 Specimen(s) / Culture(s) sent or taken to Lab for analysis []  - 0 Patient Transfer (multiple staff / Civil Service fast streamer / Similar devices) []  - 0 Simple Staple / Suture removal (25 or less) []  - 0 Complex Staple / Suture removal (26 or more) []  - 0 Hypo / Hyperglycemic Management (close monitor of Blood Glucose) []  - 0 Ankle / Brachial Index (ABI) - do not check if billed separately X- 1 5 Vital Signs Duffus, Gretta I. ([id number]) Has the patient been seen at the hospital within the last three years: Yes Total Score: 60 Level Of Care: New/Established - Level 2 Electronic Signature(s) Signed: 12/02/2017 3:54:45 PM By: [name] Entered By: [name] on 12/01/2017 13:07:21 [name] ([id number]) -------------------------------------------------------------------------------- Encounter Discharge Information Details Patient Name: [name] Date of Service: 12/01/2017 1:00 PM Medical Record Number: [id number] Patient Account Number: [id number] Date of Birth/Sex: 10/30/1945 (72 y.o. F) Treating RN: Ahmed Prima Primary Care Baudelia Schroepfer: Harrel Lemon Other  Clinician: Referring Yosgart Pavey: FITZGERALD, DAVID Treating Hyder Deman/Extender: Cathie Olden in Treatment: 8 Encounter Discharge Information Items Discharge Condition: Stable Ambulatory Status: Cane Discharge Destination: Home Transportation: Private Auto Accompanied By: husband and grandsons Schedule Follow-up Appointment: No Clinical Summary of Care: Electronic Signature(s) Signed: 12/02/2017 3:54:45 PM By: [name] Entered By: [name] on 12/01/2017 13:08:43 Fitzgibbon, [name] ([id number]) -------------------------------------------------------------------------------- Lower Extremity Assessment Details Patient Name: [name] Date of Service: 12/01/2017 1:00 PM Medical Record Number: [id number] Patient Account Number: [id number] Date of Birth/Sex: 1945/09/30 (72 y.o. F) Treating RN: Montey Hora Primary Care Nellene Courtois: Harrel Lemon Other Clinician: Referring Geanette Buonocore: FITZGERALD, DAVID Treating Renu Asby/Extender: [name] Weeks in Treatment: 8 Vascular Assessment Pulses: Dorsalis Pedis Palpable: [Right:Yes] Posterior Tibial Extremity colors, hair growth, and conditions: Extremity Color: [Right:Hyperpigmented] Hair Growth on Extremity: [Right:No] Temperature of Extremity: [Right:Warm] Capillary Refill: [Right:< 3 seconds] Toe Nail Assessment Left: Right: Thick: Yes Discolored: Yes Deformed: No Improper Length and Hygiene: No Electronic Signature(s) Signed: 12/02/2017 4:18:40 PM By: Montey Hora Entered By: Montey Hora on 12/01/2017 13:00:19 Ellerman, [name] ([id number]) -------------------------------------------------------------------------------- Multi Wound Chart Details Patient Name: [name] Date of Service: 12/01/2017 1:00 PM Medical Record Number: [id number] Patient Account Number: [id number] Date of Birth/Sex: 07-26-1945 (72 y.o. F) Treating RN: Ahmed Prima Primary Care Jonie Burdell: Harrel Lemon Other  Clinician: Referring Manu Rubey: FITZGERALD, DAVID Treating Leydi Winstead/Extender: [name] Weeks in Treatment: 8 Vital Signs Height(in): 68 Pulse(bpm): 69 Weight(lbs): 121 Blood Pressure(mmHg): 152/67 Body Mass Index(BMI): 18 Temperature(F): 98.5 Respiratory Rate 18 (breaths/min): Photos: [1:No Photos] [N/A:N/A] Wound Location: [1:Right, Lateral Malleolus] [N/A:N/A] Wounding Event: [1:Gradually Appeared] [N/A:N/A] Primary Etiology: [1:Venous Leg Ulcer] [N/A:N/A] Comorbid History: [1:Glaucoma, Chronic Obstructive Pulmonary Disease (COPD), Hypertension, Received Chemotherapy] [N/A:N/A] Date Acquired: [1:04/12/2017] [N/A:N/A] Weeks of Treatment: [1:8] [N/A:N/A] Wound Status: [1:Healed - Epithelialized] [N/A:N/A] Measurements L x W x D [1:0x0x0] [N/A:N/A] (cm) Area (cm) : [  1:0] [N/A:N/A] Volume (cm) : [1:0] [N/A:N/A] % Reduction in Area: [1:100.00%] [N/A:N/A] % Reduction in Volume: [1:100.00%] [N/A:N/A] Classification: [1:Full Thickness Without Exposed Support Structures] [N/A:N/A] Exudate Amount: [1:Small] [N/A:N/A] Exudate Type: [1:Serosanguineous] [N/A:N/A] Exudate Color: [1:red, brown] [N/A:N/A] Wound Margin: [1:Flat and Intact] [N/A:N/A] Granulation Amount: [1:Medium (34-66%)] [N/A:N/A] Granulation Quality: [1:Pink] [N/A:N/A] Necrotic Amount: [1:Medium (34-66%)] [N/A:N/A] Exposed Structures: [1:Fat Layer (Subcutaneous Tissue) Exposed: Yes Fascia: No Tendon: No Muscle: No Joint: No Bone: No] [N/A:N/A] Epithelialization: [1:None] [N/A:N/A] Periwound Skin Texture: [1:Induration: Yes Callus: Yes] [N/A:N/A] Excoriation: No Crepitus: No Rash: No Scarring: No Periwound Skin Moisture: Maceration: No N/A N/A Dry/Scaly: No Periwound Skin Color: Ecchymosis: Yes N/A N/A Atrophie Blanche: No Cyanosis: No Erythema: No Hemosiderin Staining: No Mottled: No Pallor: No Rubor: No Temperature: No Abnormality N/A N/A Tenderness on Palpation: Yes N/A N/A Wound  Preparation: Ulcer Cleansing: N/A N/A Rinsed/Irrigated with Saline Topical Anesthetic Applied: Other: lidocaine 4% Treatment Notes Electronic Signature(s) Signed: 12/01/2017 1:17:17 PM By: [name] Entered By: [name] on 12/01/2017 13:17:17 [name] ([id number]) -------------------------------------------------------------------------------- [place] Details Patient Name: [name] Date of Service: 12/01/2017 1:00 PM Medical Record Number: [id number] Patient Account Number: [id number] Date of Birth/Sex: 11-12-1945 (72 y.o. F) Treating RN: Ahmed Prima Primary Care Selenne Coggin: Harrel Lemon Other Clinician: Referring Davonna Ertl: FITZGERALD, DAVID Treating Cashius Grandstaff/Extender: [name] Weeks in Treatment: 8 Active Inactive Electronic Signature(s) Signed: 12/02/2017 3:54:45 PM By: [name] Entered By: [name] on 12/01/2017 13:08:28 [name] ([id number]) -------------------------------------------------------------------------------- Pain Assessment Details Patient Name: [name] Date of Service: 12/01/2017 1:00 PM Medical Record Number: [id number] Patient Account Number: [id number] Date of Birth/Sex: Sep 08, 1945 (72 y.o. F) Treating RN: Montey Hora Primary Care Avilene Marrin: Harrel Lemon Other Clinician: Referring Adamari Frede: FITZGERALD, DAVID Treating Lissandro Dilorenzo/Extender: [name] Weeks in Treatment: 8 Active Problems Location of Pain Severity and Description of Pain Patient Has Paino No Site Locations Pain Management and Medication Current Pain Management: Electronic Signature(s) Signed: 12/02/2017 4:18:40 PM By: Montey Hora Entered By: Montey Hora on 12/01/2017 12:53:26 Tufaro, [name] ([id number]) -------------------------------------------------------------------------------- Patient/Caregiver Education Details Patient Name: [name] Date of Service: 12/01/2017 1:00 PM Medical  Record Number: [id number] Patient Account Number: [id number] Date of Birth/Gender: 1946/01/17 (72 y.o. F) Treating RN: Ahmed Prima Primary Care Physician: Harrel Lemon Other Clinician: Referring Physician: FITZGERALD, DAVID Treating Physician/Extender: Cathie Olden in Treatment: 8 Education Assessment Education Provided To: Patient and Caregiver husband Education Topics Provided Wound/Skin Impairment: Handouts: Other: Please call our office if you have any questions or concerns. Methods: Explain/Verbal Responses: State content correctly Electronic Signature(s) Signed: 12/02/2017 3:54:45 PM By: [name] Entered By: [name] on 12/01/2017 13:08:13 Prescott, [name] ([id number]) -------------------------------------------------------------------------------- Wound Assessment Details Patient Name: [name] Date of Service: 12/01/2017 1:00 PM Medical Record Number: [id number] Patient Account Number: [id number] Date of Birth/Sex: [birth year] (72 y.o. F) Treating RN: Ahmed Prima Primary Care Abdulkadir Emmanuel: Harrel Lemon Other Clinician: Referring Rosanna Bickle: FITZGERALD, DAVID Treating Kenyatte Chatmon/Extender: [name] Weeks in Treatment: 8 Wound Status Wound Number: 1 Primary Venous Leg Ulcer Etiology: Wound Location: Right, Lateral Malleolus Wound Healed - Epithelialized Wounding Event: Gradually Appeared Status: Date Acquired: 04/12/2017 Comorbid Glaucoma, Chronic Obstructive Pulmonary Weeks Of Treatment: 8 History: Disease (COPD), Hypertension, Received Clustered Wound: No Chemotherapy Photos Photo Uploaded By: Montey Hora on 12/01/2017 13:51:23 Wound Measurements Length: (cm) 0 % Redu Width: (cm) 0 % Redu Depth: (cm) 0 Epithe Area: (cm) 0 Tunne Volume: (cm) 0 Under ction in Area: 100% ction in Volume: 100% lialization: None ling:  No mining: No Wound Description Full Thickness Without Exposed  Support Classification: Structures Wound Margin: Flat and Intact Exudate Small Amount: Exudate Type: Serosanguineous Exudate Color: red, brown Foul Odor After Cleansing: No Slough/Fibrino Yes Wound Bed Granulation Amount: Medium (34-66%) Exposed Structure Granulation Quality: Pink Fascia Exposed: No Necrotic Amount: Medium (34-66%) Fat Layer (Subcutaneous Tissue) Exposed: Yes Necrotic Quality: Adherent Slough Tendon Exposed: No Muscle Exposed: No Joint Exposed: No Bone Exposed: No Burpee, Neveah I. ([id number]) Periwound Skin Texture Texture Color No Abnormalities Noted: No No Abnormalities Noted: No Callus: Yes Atrophie Blanche: No Crepitus: No Cyanosis: No Excoriation: No Ecchymosis: Yes Induration: Yes Erythema: No Rash: No Hemosiderin Staining: No Scarring: No Mottled: No Pallor: No Moisture Rubor: No No Abnormalities Noted: No Dry / Scaly: No Temperature / Pain Maceration: No Temperature: No Abnormality Tenderness on Palpation: Yes Wound Preparation Ulcer Cleansing: Rinsed/Irrigated with Saline Topical Anesthetic Applied: Other: lidocaine 4%, Electronic Signature(s) Signed: 12/02/2017 3:54:45 PM By: [name] Entered By: [name] on 12/01/2017 13:07:33 Demers, [name] ([id number]) -------------------------------------------------------------------------------- Vitals Details Patient Name: [name] Date of Service: 12/01/2017 1:00 PM Medical Record Number: [id number] Patient Account Number: [id number] Date of Birth/Sex: Nov 07, 1945 (72 y.o. F) Treating RN: Montey Hora Primary Care Keyuana Wank: Harrel Lemon Other Clinician: Referring Izaac Reisig: FITZGERALD, DAVID Treating Reina Wilton/Extender: [name] Weeks in Treatment: 8 Vital Signs Time Taken: 12:54 Temperature (F): 98.5 Height (in): 68 Pulse (bpm): 78 Weight (lbs): 121 Respiratory Rate (breaths/min): 18 Body Mass Index (BMI): 18.4 Blood Pressure (mmHg):  152/67 Reference Range: 80 - 120 mg / dl Electronic Signature(s) Signed: 12/02/2017 4:18:40 PM By: Montey Hora Entered By: Montey Hora on 12/01/2017 12:55:18

## 2017-12-14 NOTE — Progress Notes (Signed)
Autumn [name] ([id number]) Visit Report for 12/01/2017 Chief Complaint Document Details Patient Name: Autumn [name] I. Date of Service: 12/01/2017 1:00 PM Medical Record Number: [id number] Patient Account Number: [id number] Date of Birth/Sex: 26-Jan-1946 (72 y.o. F) Treating RN: Ahmed Prima Primary Care Provider: Harrel Lemon Other Clinician: Referring Provider: FITZGERALD, DAVID Treating Provider/Extender: Cathie Olden in Treatment: 8 Information Obtained from: Patient Chief Complaint chronic right lateral malleolus ulcer Electronic Signature(s) Signed: 12/01/2017 1:17:25 PM By: [name] Entered By: [name] on 12/01/2017 13:17:25 [name], [name] ([id number]) -------------------------------------------------------------------------------- HPI Details Patient Name: Autumn [name] I. Date of Service: 12/01/2017 1:00 PM Medical Record Number: [id number] Patient Account Number: [id number] Date of Birth/Sex: Aug 23, 1945 (72 y.o. F) Treating RN: Ahmed Prima Primary Care Provider: Harrel Lemon Other Clinician: Referring Provider: FITZGERALD, DAVID Treating Provider/Extender: Cathie Olden in Treatment: 8 History of Present Illness HPI Description: 10/06/17-She is here for initial evaluation of a chronic right lateral malleolus ulcer. She provides a vague history regarding this ulcer. The ulcer has been present for greater than 6 months, although she cannot specify anything more than that. She denies any known trauma or precipitating event other than scratching. She admits to seeing dermatology (does not remember who; there are no records in epic system) several months ago at which time they removed a scab to reveal the current ulceration; she says at one time they considered performing a biopsy but did not perform because it was "getting better". According to her and her husband the ulcer is essentially unchanged, has never healed, never been worse. She reports  pain only with squeezing pressure, otherwise denies pain. She reports minimal to no drainage; has been applying antibiotic ointment/cream. She carries a history of pulmonary sarcoidosis; non-diabetic; non-smoker; chronic immunosuppression with prednisone 10/20/17-She is here for follow-up evaluation for chronic right lateral malleolus ulcer. There is essentially no change. Did review you and see records. 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 She is voicing no complaints or concerns. We will transition to collagen product and evaluate for any improvement. 11/03/17-She is seen in follow-up evaluation for chronic right lateral malleolus ulcer. After selective debridement of dried exudate and epidermis it appears that most of the wound is epithelialized with a small area of partial-thickness opening to the proximal aspect; the other alternative to my assessment is that there is an extremely dry and fibrotic base, not yielding to sharp debridement. We will trial since her for 2 weeks, as this will not damage tissue there is epithelialized but may contribute and softening and/or enzymatically debriding any fibrotic/nonviable tissue. 11/17/17-She is seen in follow-up evaluation for chronic right lateral malleolus ulcer. There is no evidence of drainage; she and her spouse deny any evidence of drainage over the last 2 weeks. There is continued improvement and pain. We will continue with 2 additional weeks of Santyl; I do believe she is healed but will continue with treatment in the case that there is nonvisible opening. She will follow-up in 2 weeks with anticipation of discharge at that time 12/01/17-She is seen in follow evaluation right lateral malleolus ulcer. There continues to be no evidence of drainage or opening. She will be discharged from wound care clinic. She and her husband have been advised to continue with dry dressing  for additional 2 weeks and monitor for any sign of drainage and if continued evidence of healing/no drainage after two weeks she may submerge in water Electronic Signature(s) Signed: 12/01/2017 1:19:38  PM By: [name] Entered By: [name] on 12/01/2017 13:19:38 Autumn [name] ([id number]) -------------------------------------------------------------------------------- Physician Orders Details Patient Name: Autumn [name] I. Date of Service: 12/01/2017 1:00 PM Medical Record Number: [id number] Patient Account Number: [id number] Date of Birth/Sex: 1945/10/07 (72 y.o. F) Treating RN: Ahmed Prima Primary Care Provider: Harrel Lemon Other Clinician: Referring Provider: FITZGERALD, DAVID Treating Provider/Extender: Cathie Olden in Treatment: 8 Verbal / Phone Orders: Yes Clinician: Carolyne Fiscal, Debi Read Back and Verified: Yes Diagnosis Coding Discharge From [facility] Wound #1 Right,Lateral Malleolus o Discharge from [place] - Please call our office if you have any questions or concerns. Electronic Signature(s) Signed: 12/02/2017 3:54:45 PM By: [name] Signed: 12/13/2017 5:47:54 PM By: [name] Entered By: [name] on 12/01/2017 13:06:59 [name], [name] ([id number]) -------------------------------------------------------------------------------- Problem List Details Patient Name: Autumn [name] I. Date of Service: 12/01/2017 1:00 PM Medical Record Number: [id number] Patient Account Number: [id number] Date of Birth/Sex: 15-Dec-1945 (72 y.o. 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 ([id number]) -------------------------------------------------------------------------------- Progress Note Details Patient Name: Autumn [name] I. Date of Service: 12/01/2017 1:00 PM Medical Record Number: [id number] Patient Account Number: [id number] Date of Birth/Sex: 1945-04-27 (72 y.o. F) Treating RN: Ahmed Prima Primary Care Provider: Harrel Lemon Other Clinician: Referring Provider: FITZGERALD, DAVID Treating Provider/Extender: Cathie Olden in Treatment: 8 Subjective Chief Complaint Information obtained from Patient chronic right lateral malleolus ulcer History of Present Illness (HPI) 10/06/17-She is here for initial evaluation of a chronic right lateral malleolus ulcer. She provides a vague history regarding this ulcer. The ulcer has been present for greater than 6 months, although she cannot specify anything more than that. She denies any known trauma or precipitating event other than scratching. She admits to seeing dermatology (does not remember who; there are no records in epic system) several months ago at which time they removed a scab to reveal the current ulceration; she says at one time they considered performing a biopsy but did not perform because it was "getting better". According to her and her husband the ulcer is essentially unchanged, has never healed, never been worse. She reports pain only with squeezing pressure, otherwise denies pain. She reports minimal to no drainage; has been applying antibiotic ointment/cream. She carries a history of pulmonary sarcoidosis; non-diabetic; non-smoker; chronic immunosuppression with prednisone 10/20/17-She is here for follow-up evaluation for chronic right lateral malleolus ulcer. There is essentially no change. Did review you  and see records. 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 She is voicing no complaints or concerns. We will transition to collagen product and evaluate for any improvement. 11/03/17-She is seen in follow-up evaluation for chronic right lateral malleolus ulcer. After selective debridement of dried exudate and epidermis it appears that most of the wound is epithelialized with a small area of partial-thickness opening to the proximal aspect; the other alternative to my assessment is that there is an extremely dry and fibrotic base, not yielding to sharp debridement. We will trial since her for 2 weeks, as this will not  damage tissue there is epithelialized but may contribute and softening and/or enzymatically debriding any fibrotic/nonviable tissue. 11/17/17-She is seen in follow-up evaluation for chronic right lateral malleolus ulcer. There is no evidence of drainage; she and her spouse deny any evidence of drainage over the last 2 weeks. There is continued improvement and pain. We will continue with 2 additional weeks of Santyl; I do believe she is healed but will continue with treatment in the case that there is nonvisible opening. She will follow-up in 2 weeks with anticipation of discharge at that time 12/01/17-She is seen in follow evaluation right lateral malleolus ulcer. There continues to be no evidence of drainage or opening. She will be discharged from wound care clinic. She and her husband have been advised to continue with dry dressing for additional 2 weeks and monitor for any sign of drainage and if continued evidence of healing/no drainage after two weeks she may submerge in water Objective Constitutional Vitals Time Taken: 12:54 PM, Height: 68 in, Weight: 121 lbs, BMI: 18.4, Temperature: 98.5 F, Pulse: 78 bpm, Respiratory Rate: 18 breaths/min, Blood Pressure: 152/67 mmHg. CONLEY, PAWLING I.  ([id number]) Integumentary (Hair, Skin) Wound #1 status is Healed - Epithelialized. Original cause of wound was Gradually Appeared. The wound is located on the Right,Lateral Malleolus. The wound measures 0cm length x 0cm width x 0cm depth; 0cm^2 area and 0cm^3 volume. There is Fat Layer (Subcutaneous Tissue) Exposed exposed. There is no tunneling or undermining noted. There is a small amount of serosanguineous drainage noted. The wound margin is flat and intact. There is medium (34-66%) pink granulation within the wound bed. There is a medium (34-66%) amount of necrotic tissue within the wound bed including Adherent Slough. The periwound skin appearance exhibited: Callus, Induration, Ecchymosis. The periwound skin appearance did not exhibit: Crepitus, Excoriation, Rash, Scarring, Dry/Scaly, Maceration, Atrophie Blanche, Cyanosis, Hemosiderin Staining, Mottled, Pallor, Rubor, Erythema. Periwound temperature was noted as No Abnormality. The periwound has tenderness on palpation. 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 Electronic Signature(s) Signed: 12/01/2017 1:20:01 PM By: [name] Entered By: [name] on 12/01/2017 13:20:01 Autumn [name] I. ([id number]) -------------------------------------------------------------------------------- SuperBill Details Patient Name: Autumn [name] I. Date of Service: 12/01/2017 Medical Record Number: [id number] Patient Account Number: [id number] Date of Birth/Sex: [birth year] (72 y.o. 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

## 2017-12-15 ENCOUNTER — Encounter: Payer: Medicare Other | Admitting: Student in an Organized Health Care Education/Training Program

## 2017-12-20 ENCOUNTER — Encounter: Payer: Medicare Other | Admitting: Student in an Organized Health Care Education/Training Program

## 2017-12-27 ENCOUNTER — Ambulatory Visit
Payer: Medicare Other | Attending: Student in an Organized Health Care Education/Training Program | Admitting: Student in an Organized Health Care Education/Training Program

## 2017-12-27 ENCOUNTER — Other Ambulatory Visit: Payer: Self-pay

## 2017-12-27 VITALS — BP 132/56 | HR 73 | Temp 98.6°F | Ht 67.0 in | Wt 123.0 lb

## 2017-12-27 DIAGNOSIS — S81801A Unspecified open wound, right lower leg, initial encounter: Secondary | ICD-10-CM | POA: Insufficient documentation

## 2017-12-27 DIAGNOSIS — H409 Unspecified glaucoma: Secondary | ICD-10-CM | POA: Insufficient documentation

## 2017-12-27 DIAGNOSIS — B0229 Other postherpetic nervous system involvement: Secondary | ICD-10-CM | POA: Diagnosis not present

## 2017-12-27 DIAGNOSIS — Z79891 Long term (current) use of opiate analgesic: Secondary | ICD-10-CM | POA: Diagnosis not present

## 2017-12-27 DIAGNOSIS — I272 Pulmonary hypertension, unspecified: Secondary | ICD-10-CM | POA: Insufficient documentation

## 2017-12-27 DIAGNOSIS — M792 Neuralgia and neuritis, unspecified: Secondary | ICD-10-CM

## 2017-12-27 DIAGNOSIS — Z7952 Long term (current) use of systemic steroids: Secondary | ICD-10-CM | POA: Insufficient documentation

## 2017-12-27 DIAGNOSIS — Z760 Encounter for issue of repeat prescription: Secondary | ICD-10-CM | POA: Insufficient documentation

## 2017-12-27 DIAGNOSIS — Z79899 Other long term (current) drug therapy: Secondary | ICD-10-CM | POA: Diagnosis not present

## 2017-12-27 DIAGNOSIS — I10 Essential (primary) hypertension: Secondary | ICD-10-CM | POA: Diagnosis not present

## 2017-12-27 DIAGNOSIS — G894 Chronic pain syndrome: Secondary | ICD-10-CM

## 2017-12-27 DIAGNOSIS — M5414 Radiculopathy, thoracic region: Secondary | ICD-10-CM

## 2017-12-27 DIAGNOSIS — F329 Major depressive disorder, single episode, unspecified: Secondary | ICD-10-CM | POA: Insufficient documentation

## 2017-12-27 DIAGNOSIS — D860 Sarcoidosis of lung: Secondary | ICD-10-CM | POA: Diagnosis not present

## 2017-12-27 DIAGNOSIS — Z5181 Encounter for therapeutic drug level monitoring: Secondary | ICD-10-CM | POA: Diagnosis present

## 2017-12-27 DIAGNOSIS — J449 Chronic obstructive pulmonary disease, unspecified: Secondary | ICD-10-CM | POA: Insufficient documentation

## 2017-12-27 DIAGNOSIS — X58XXXA Exposure to other specified factors, initial encounter: Secondary | ICD-10-CM | POA: Insufficient documentation

## 2017-12-27 MED ORDER — BUPRENORPHINE HCL 150 MCG BU FILM: 150.0000 ug | ORAL_FILM | Freq: Two times a day (BID) | BUCCAL | 1 refills | Status: DC

## 2017-12-27 NOTE — Patient Instructions (Addendum)
1.  We will continue with belbuca wean.  Reduce to 150 mcg twice daily. 2.  Given sedation at your current Lyrica dose, reduce to 100 mg twice daily.   Pick up electronic prescription for Belbuca at your pharmacy.         Medication Rules  Applies to: All patients receiving prescriptions (written or electronic).  Pharmacy of record: Pharmacy where electronic prescriptions will be sent. If written prescriptions are taken to a different pharmacy, please inform the nursing staff. The pharmacy listed in the electronic medical record should be the one where you would like electronic prescriptions to be sent.  Prescription refills: Only during scheduled appointments. Applies to both, written and electronic prescriptions.  NOTE: The following applies primarily to controlled substances (Opioid* Pain Medications).   Patient's responsibilities: 1. Pain Pills: Bring all pain pills to every appointment (except for procedure appointments). 2. Pill Bottles: Bring pills in original pharmacy bottle. Always bring newest bottle. Bring bottle, even if empty. 3. Medication refills: You are responsible for knowing and keeping track of what medications you need refilled. The day before your appointment, write a list of all prescriptions that need to be refilled. Bring that list to your appointment and give it to the admitting nurse. Prescriptions will be written only during appointments. If you forget a medication, it will not be "Called in", "Faxed", or "electronically sent". You will need to get another appointment to get these prescribed. 4. Prescription Accuracy: You are responsible for carefully inspecting your prescriptions before leaving our office. Have the discharge nurse carefully go over each prescription with you, before taking them home. Make sure that your name is accurately spelled, that your address is correct. Check the name and dose of your medication to make sure it is accurate. Check  the number of pills, and the written instructions to make sure they are clear and accurate. Make sure that you are given enough medication to last until your next medication refill appointment. 5. Taking Medication: Take medication as prescribed. Never take more pills than instructed. Never take medication more frequently than prescribed. Taking less pills or less frequently is permitted and encouraged, when it comes to controlled substances (written prescriptions).  6. Inform other Doctors: Always inform, all of your healthcare providers, of all the medications you take. 7. Pain Medication from other Providers: You are not allowed to accept any additional pain medication from any other Doctor or Healthcare provider. There are two exceptions to this rule. (see below) In the event that you require additional pain medication, you are responsible for notifying us, as stated below. 8. Medication Agreement: You are responsible for carefully reading and following our Medication Agreement. This must be signed before receiving any prescriptions from our practice. Safely store a copy of your signed Agreement. Violations to the Agreement will result in no further prescriptions. (Additional copies of our Medication Agreement are available upon request.) 9. Laws, Rules, & Regulations: All patients are expected to follow all Federal and [employer], [employer], Rules, [employer]. Ignorance of the Laws does not constitute a valid excuse. The use of any illegal substances is prohibited. 10. Adopted CDC guidelines & recommendations: Target dosing levels will be at or below 60 MME/day. Use of benzodiazepines** is not recommended.  Exceptions: There are only two exceptions to the rule of not receiving pain medications from other Healthcare Providers. 1. Exception #1 (Emergencies): In the event of an emergency (i.e.: accident requiring emergency care), you are allowed to receive additional  pain medication. However, you are  responsible for: As soon as you are able, call our office [phone], at any time of the day or night, and leave a message stating your name, the date and nature of the emergency, and the name and dose of the medication prescribed. In the event that your call is answered by a member of our staff, make sure to document and save the date, time, and the name of the person that took your information.  2. Exception #2 (Planned Surgery): In the event that you are scheduled by another doctor or dentist to have any type of surgery or procedure, you are allowed (for a period no longer than 30 days), to receive additional pain medication, for the acute post-op pain. However, in this case, you are responsible for picking up a copy of our "Post-op Pain Management for Surgeons" handout, and giving it to your surgeon or dentist. This document is available at our office, and does not require an appointment to obtain it. Simply go to our office during business hours (Monday-Thursday from 8:00 AM to 4:00 PM) (Friday 8:00 AM to 12:00 Noon) or if you have a scheduled appointment with [place], prior to your surgery, and ask for it by name. In addition, you will need to provide [place] with your name, name of your surgeon, type of surgery, and date of procedure or surgery.  *Opioid medications include: morphine, codeine, oxycodone, oxymorphone, hydrocodone, hydromorphone, meperidine, tramadol, tapentadol, buprenorphine, fentanyl, methadone. **Benzodiazepine medications include: diazepam (Valium), alprazolam (Xanax), clonazepam (Klonopine), lorazepam (Ativan), clorazepate (Tranxene), chlordiazepoxide (Librium), estazolam (Prosom), oxazepam (Serax), temazepam (Restoril), triazolam (Halcion) (Last updated: 06/09/2017)

## 2017-12-27 NOTE — Progress Notes (Signed)
Patient's Name: Autumn [name]  MRN: [id number]  Referring Provider: Baxter Hire, MD  DOB: Jan 14, 1946  PCP: Baxter Hire, MD  DOS: 12/27/2017  Note by: Gillis Santa, MD  Service setting: Ambulatory outpatient  Specialty: Interventional Pain Management  Location: [facility] (AMB) Pain Management Facility    Patient type: Established   Primary Reason(s) for Visit: Encounter for prescription drug management. (Level of risk: moderate)  CC: Medication Refill  HPI  Autumn [name] is a 72 y.o. year old, female patient, who comes today for a medication management evaluation. She has Pneumonia; COPD (chronic obstructive pulmonary disease) ([place]); Long term current use of systemic steroids; [name] herpetic neuralgia; Pulmonary hypertension (HCC); and Pulmonary sarcoidosis (HCC) on their problem list. Her primarily concern today is the Medication Refill  Pain Assessment: Location: Left, Anterior Chest Radiating: Radiates from left chest to left mid back  Onset: More than a month ago Duration: Chronic pain Quality: Constant, Sharp, Burning, Stabbing Severity: 7 /10 (subjective, self-reported pain score)  Note: Reported level is inconsistent with clinical observations. Clinically the patient looks like a 3/10 A 3/10 is viewed as "Moderate" and described as significantly interfering with activities of daily living (ADL). It becomes difficult to feed, bathe, get dressed, get on and off the toilet or to perform personal hygiene functions. Difficult to get in and out of bed or a chair without assistance. Very distracting. With effort, it can be ignored when deeply involved in activities.       When using our objective Pain Scale, levels between 6 and 10/10 are said to belong in an emergency room, as it progressively worsens from a 6/10, described as severely limiting, requiring emergency care not usually available at an outpatient pain management facility. At a 6/10 level, communication becomes difficult and requires  great effort. Assistance to reach the emergency department may be required. Facial flushing and profuse sweating along with potentially dangerous increases in heart rate and blood pressure will be evident. Effect on ADL: " I don't have a lot of energy"  Timing: Constant Modifying factors: Medications (Belbuca)  BP: (!) 132/56  HR: 73  Autumn [name] was last scheduled for an appointment on 12/20/2017 for medication management. During today's appointment we reviewed Autumn [name]'s chronic pain status, as well as her outpatient medication regimen.  Patient follows up in the context of her belbuca wean.  Her current dose is at 300 micro grams twice daily.  She states that she is doing fine with the wean.  She is also on Lyrica 100 mill grams 3 times a day.  Is endorsing sedation with Lyrica  The patient  reports that she does not use drugs. Her body mass index is 19.26 kg/m.  Further details on both, my assessment(s), as well as the proposed treatment plan, please see below.  Controlled Substance Pharmacotherapy Assessment REMS (Risk Evaluation and Mitigation Strategy)  Analgesic: Belbuca 300 mcg twice daily MME/day: Less than 20 mg/day.  Janne Napoleon, RN  12/27/2017  2:42 PM  Sign at close encounter Safety precautions to be maintained throughout the outpatient stay will include: orient to surroundings, keep bed in low position, maintain call bell within reach at all times, provide assistance with transfer out of bed and ambulation.   Nursing Pain Medication Assessment:  Safety precautions to be maintained throughout the outpatient stay will include: orient to surroundings, keep bed in low position, maintain call bell within reach at all times, provide assistance with transfer out of bed and ambulation.  Medication Inspection Compliance: Ms. Jankovich did not comply with our request to bring her pills to be counted. She was reminded that bringing the medication bottles, even when empty, is a  requirement.  Medication: None brought in. (Belbuca)  Pill/Patch Count: None available to be counted. Bottle Appearance: No container available. Did not bring bottle(s) to appointment. Filled Date: N/A Last Medication intake:  Ran out of medicine more than 48 hours ago    Pharmacokinetics: Liberation and absorption (onset of action): WNL Distribution (time to peak effect): WNL Metabolism and excretion (duration of action): WNL         Pharmacodynamics: Desired effects: Analgesia: Ms. Mangini reports >50% benefit. Functional ability: Patient reports that medication allows her to accomplish basic ADLs Clinically meaningful improvement in function (CMIF): Sustained CMIF goals met Perceived effectiveness: Described as relatively effective, allowing for increase in activities of daily living (ADL) Undesirable effects: Side-effects or Adverse reactions: None reported Monitoring: [place] PMP: Online review of the past 41-monthperiod conducted. Compliant with practice rules and regulations Last UDS on record: Summary  Date Value Ref Range Status  03/22/2017 FINAL  Final    Comment:    ==================================================================== TOXASSURE COMP DRUG ANALYSIS,UR ==================================================================== Test                             Result       Flag       Units Drug Present and Declared for Prescription Verification   Buprenorphine                  23           EXPECTED   ng/mg creat   Norbuprenorphine               112          EXPECTED   ng/mg creat    Source of buprenorphine is a scheduled prescription medication.    Norbuprenorphine is an expected metabolite of buprenorphine.   Gabapentin                     PRESENT      EXPECTED   Sertraline                     PRESENT      EXPECTED   Desmethylsertraline            PRESENT      EXPECTED    Desmethylsertraline is an expected metabolite of sertraline.   Atenolol                        PRESENT      EXPECTED Drug Absent but Declared for Prescription Verification   Hydrocodone                    Not Detected UNEXPECTED ng/mg creat   Tramadol                       Not Detected UNEXPECTED ng/mg creat   Acetaminophen                  Not Detected UNEXPECTED    Acetaminophen, as indicated in the declared medication list, is    not always detected even when used as directed.   Ibuprofen  Not Detected UNEXPECTED    Ibuprofen, as indicated in the declared medication list, is not    always detected even when used as directed.   Diphenhydramine                Not Detected UNEXPECTED ==================================================================== Test                      Result    Flag   Units      Ref Range   Creatinine              182              mg/dL      >=20 ==================================================================== Declared Medications:  The flagging and interpretation on this report are based on the  following declared medications.  Unexpected results may arise from  inaccuracies in the declared medications.  **Note: The testing scope of this panel includes these medications:  Atenolol  Buprenorphine  Diphenhydramine  Gabapentin  Hydrocodone (Hydrocodone-Acetaminophen)  Sertraline  Tramadol  **Note: The testing scope of this panel does not include small to  moderate amounts of these reported medications:  Acetaminophen (Hydrocodone-Acetaminophen)  Ibuprofen  **Note: The testing scope of this panel does not include following  reported medications:  Albuterol  Amoxicillin (Augmentin)  Atorvastatin  Azithromycin  Brimonidine Tartrate  Brinzolamide  Calcium (Calcium/Vitamin D3)  Clavulinate (Augmentin)  Fluticasone (Advair)  Latanoprost  Omega-3 Fatty Acids  Ondansetron  Prednisone  Salmeterol (Advair)  Tiotropium  Topical  Vitamin D3 (Calcium/Vitamin  D3) ==================================================================== For clinical consultation, please call [phone]. ====================================================================    UDS interpretation: Compliant          Medication Assessment Form: Reviewed. Patient indicates being compliant with therapy Treatment compliance: Compliant Risk Assessment Profile: Aberrant behavior: See prior evaluations. None observed or detected today Comorbid factors increasing risk of overdose: See prior notes. No additional risks detected today Opioid risk tool (ORT) (Total Score): 3 Personal History of Substance Abuse (SUD-Substance use disorder):  Alcohol: Negative  Illegal Drugs: Negative  Rx Drugs: Negative  ORT Risk Level calculation: Low Risk Risk of substance use disorder (SUD): Low Opioid Risk Tool - 12/27/17 1441      Family History of Substance Abuse   Alcohol  Negative    Illegal Drugs  Positive Female    Rx Drugs  Negative      Personal History of Substance Abuse   Alcohol  Negative    Illegal Drugs  Negative    Rx Drugs  Negative      Age   Age between 54-45 years   No      History of Preadolescent Sexual Abuse   History of Preadolescent Sexual Abuse  Negative or Female      Psychological Disease   Psychological Disease  Negative    Depression  Positive      Total Score   Opioid Risk Tool Scoring  3    Opioid Risk Interpretation  Low Risk      ORT Scoring interpretation table:  Score <3 = Low Risk for SUD  Score between 4-7 = Moderate Risk for SUD  Score >8 = High Risk for Opioid Abuse   Risk Mitigation Strategies:  Patient Counseling: Covered Patient-Prescriber Agreement (PPA): Present and active  Notification to other healthcare providers: Done  Pharmacologic Plan: Continue weaning belbuca 300 mcg-->150 mcg twice daily             Laboratory Chemistry  Inflammation Markers (CRP:  Acute Phase) (ESR: Chronic Phase) Lab Results  Component Value  Date   ESRSEDRATE 48 (H) 05/25/2011                         Rheumatology Markers No results found for: RF, ANA, LABURIC, URICUR, LYMEIGGIGMAB, LYMEABIGMQN, HLAB27                      Renal Function Markers Lab Results  Component Value Date   BUN 23 (H) 11/18/2015   CREATININE 0.70 11/18/2015   GFRAA >60 11/18/2015   GFRNONAA >60 11/18/2015                             Hepatic Function Markers Lab Results  Component Value Date   AST 21 03/29/2015   ALT 20 03/29/2015   ALBUMIN 4.4 03/29/2015   ALKPHOS 46 03/29/2015   LIPASE 116 12/15/2013                        Electrolytes Lab Results  Component Value Date   NA 139 11/18/2015   K 3.6 11/18/2015   CL 97 (L) 11/18/2015   CALCIUM 9.7 11/18/2015                        Neuropathy Markers No results found for: VITAMINB12, FOLATE, HGBA1C, HIV                      CNS Tests No results found for: COLORCSF, APPEARCSF, RBCCOUNTCSF, WBCCSF, POLYSCSF, LYMPHSCSF, EOSCSF, PROTEINCSF, GLUCCSF, JCVIRUS, CSFOLI, IGGCSF                      Bone Pathology Markers No results found for: VD25OH, [id number], [id number], DJ2426ST4, 25OHVITD1, 25OHVITD2, 25OHVITD3, TESTOFREE, TESTOSTERONE                       Coagulation Parameters Lab Results  Component Value Date   INR 1.0 02/17/2013   LABPROT 13.2 02/17/2013   PLT 299 11/20/2015                        Cardiovascular Markers Lab Results  Component Value Date   CKTOTAL 66 02/17/2013   CKMB 0.7 02/17/2013   TROPONINI 0.04 (HH) 11/17/2015   HGB 11.6 (L) 11/20/2015   HCT 34.5 (L) 11/20/2015                         CA Markers No results found for: CEA, CA125, LABCA2                      Note: Lab results reviewed.  Recent Diagnostic Imaging Results  DG Ankle Complete Right CLINICAL DATA:  Nonhealing wound of the right lower extremity.  EXAM: RIGHT ANKLE - COMPLETE 3+ VIEW  COMPARISON:  None.  FINDINGS: There is a soft tissue wound over the tip of the distal fibula.  No soft tissue air.  There is no fracture. There is no bone resorption to suggest osteomyelitis.  The ankle mortise is normally spaced and aligned.  IMPRESSION: 1. No evidence of osteomyelitis. 2. No fracture. 3. Lateral right ankle soft tissue wound.  Electronically Signed   By: Lajean Manes M.D.   On: 10/06/2017 19:05  Complexity Note: Imaging  results reviewed. Results shared with Autumn [name], using Layman's terms.                         Meds   Current Outpatient Medications:  .  albuterol (PROVENTIL) (2.5 MG/3ML) 0.083% nebulizer solution, Take 2.5 mg by nebulization every 4 (four) hours as needed. , Disp: , Rfl:  .  atenolol (TENORMIN) 25 MG tablet, Take 1 tablet by mouth 2 (two) times daily., Disp: , Rfl:  .  atorvastatin (LIPITOR) 10 MG tablet, Take 1 tablet by mouth daily at 6 PM., Disp: , Rfl:  .  Brinzolamide-Brimonidine 1-0.2 % SUSP, Apply 1 drop to eye 3 (three) times daily., Disp: , Rfl:  .  CALCIUM 600+D3 600-800 MG-UNIT TABS, Take 1 tablet by mouth 2 (two) times daily with a meal., Disp: , Rfl: 11 .  denosumab (PROLIA) 60 MG/ML SOLN injection, Inject into the skin., Disp: , Rfl:  .  Fluticasone-Salmeterol (ADVAIR DISKUS) 250-50 MCG/DOSE AEPB, Inhale into the lungs., Disp: , Rfl:  .  ibuprofen (ADVIL,MOTRIN) 200 MG tablet, Take 200 mg by mouth every 6 (six) hours as needed., Disp: , Rfl:  .  Ibuprofen-Diphenhydramine HCl (ADVIL PM) 200-25 MG CAPS, Take 1 tablet by mouth at bedtime as needed., Disp: , Rfl:  .  latanoprost (XALATAN) 0.005 % ophthalmic solution, Apply 1 drop to eye at bedtime., Disp: , Rfl:  .  lidocaine (XYLOCAINE) 5 % ointment, Apply 1 application topically 4 (four) times daily as needed for moderate pain., Disp: 35.44 g, Rfl: 2 .  lidocaine-prilocaine (EMLA) cream, Apply 1 application topically 2 (two) times daily as needed., Disp: , Rfl:  .  mirtazapine (REMERON) 7.5 MG tablet, every evening., Disp: , Rfl: 11 .  Omega-3 Fatty Acids (FISH OIL PO),  Take 1 capsule by mouth every evening., Disp: , Rfl:  .  predniSONE (DELTASONE) 10 MG tablet, , Disp: , Rfl:  .  predniSONE (DELTASONE) 10 MG tablet, Take 10 mg by mouth daily with breakfast. , Disp: , Rfl:  .  pregabalin (LYRICA) 100 MG capsule, Take 1 capsule (100 mg total) by mouth 3 (three) times daily., Disp: 90 capsule, Rfl: 1 .  sertraline (ZOLOFT) 25 MG tablet, Take 25 mg by mouth daily., Disp: , Rfl: 11 .  tiotropium (SPIRIVA HANDIHALER) 18 MCG inhalation capsule, Place 1 capsule into inhaler and inhale daily., Disp: , Rfl:  .  tiZANidine (ZANAFLEX) 4 MG tablet, Take 1 tablet (4 mg total) by mouth 2 (two) times daily as needed for muscle spasms., Disp: 60 tablet, Rfl: 1 .  Buprenorphine HCl (BELBUCA) 150 MCG FILM, Place 150 mcg inside cheek every 12 (twelve) hours., Disp: 60 each, Rfl: 1 .  traMADol (ULTRAM) 50 MG tablet, Take 1 tablet by mouth 2 (two) times daily., Disp: , Rfl:   ROS  Constitutional: Denies any fever or chills Gastrointestinal: No reported hemesis, hematochezia, vomiting, or acute GI distress Musculoskeletal: Denies any acute onset joint swelling, redness, loss of ROM, or weakness Neurological: No reported episodes of acute onset apraxia, aphasia, dysarthria, agnosia, amnesia, paralysis, loss of coordination, or loss of consciousness  Allergies  Autumn [name] is allergic to ivp dye [iodinated diagnostic agents].  [place]  Drug: Autumn [name]  reports that she does not use drugs. Alcohol:  reports that she does not drink alcohol. Tobacco:  reports that she has never smoked. She has never used smokeless tobacco. Medical:  has a past medical history of Allergy, COPD (chronic obstructive pulmonary  disease) ([place]), Glaucoma, Hypertension, Osteoporosis, [place]ian cancer ([place] and Sarcoid. Surgical: Autumn [name]  has a past surgical history that includes Breast biopsy (Right) and Abdominal hysterectomy. Family: family history includes Breast cancer (age of onset: 20) in her mother;  Cancer in her mother; Hypertension in her father.  Constitutional Exam  General appearance: Well nourished, well developed, and well hydrated. In no apparent acute distress Vitals:   12/27/17 1431  BP: (!) 132/56  Pulse: 73  Temp: 98.6 F (37 C)  SpO2: 96%  Weight: 123 lb (55.8 kg)  Height: '5\' 7"'  (1.702 m)   BMI Assessment: Estimated body mass index is 19.26 kg/m as calculated from the following:   Height as of this encounter: '5\' 7"'  (1.702 m).   Weight as of this encounter: 123 lb (55.8 kg).  BMI interpretation table: BMI level Category Range association with higher incidence of chronic pain  <18 kg/m2 Underweight   18.5-24.9 kg/m2 Ideal body weight   25-29.9 kg/m2 Overweight Increased incidence by 20%  30-34.9 kg/m2 Obese (Class I) Increased incidence by 68%  35-39.9 kg/m2 Severe obesity (Class II) Increased incidence by 136%  >40 kg/m2 Extreme obesity (Class III) Increased incidence by 254%   Patient's current BMI Ideal Body weight  Body mass index is 19.26 kg/m. Ideal body weight: 61.6 kg (135 lb 12.9 oz)   BMI Readings from Last 4 Encounters:  12/27/17 19.26 kg/m  11/22/17 18.40 kg/m  10/20/17 18.40 kg/m  06/21/17 18.70 kg/m   Wt Readings from Last 4 Encounters:  12/27/17 123 lb (55.8 kg)  11/22/17 121 lb (54.9 kg)  10/20/17 121 lb (54.9 kg)  06/21/17 123 lb (55.8 kg)  Psych/Mental status: Alert, oriented x 3 (person, place, & time)       Eyes: PERLA Respiratory: No evidence of acute respiratory distress  Cervical Spine Area Exam  Skin & Axial Inspection: No masses, redness, edema, swelling, or associated skin lesions Alignment: Symmetrical Functional ROM: Unrestricted ROM      Stability: No instability detected Muscle Tone/Strength: Functionally intact. No obvious neuro-muscular anomalies detected. Sensory (Neurological): Unimpaired Palpation: No palpable anomalies              Upper Extremity (UE) Exam    Side: Right upper extremity  Side: Left upper  extremity  Skin & Extremity Inspection: Skin color, temperature, and hair growth are WNL. No peripheral edema or cyanosis. No masses, redness, swelling, asymmetry, or associated skin lesions. No contractures.  Skin & Extremity Inspection: Skin color, temperature, and hair growth are WNL. No peripheral edema or cyanosis. No masses, redness, swelling, asymmetry, or associated skin lesions. No contractures.  Functional ROM: Unrestricted ROM          Functional ROM: Unrestricted ROM          Muscle Tone/Strength: Functionally intact. No obvious neuro-muscular anomalies detected.  Muscle Tone/Strength: Functionally intact. No obvious neuro-muscular anomalies detected.  Sensory (Neurological): Unimpaired          Sensory (Neurological): Unimpaired          Palpation: No palpable anomalies              Palpation: No palpable anomalies              Provocative Test(s):  Phalen's test: deferred Tinel's test: deferred Apley's scratch test (touch opposite shoulder):  Action 1 (Across chest): deferred Action 2 (Overhead): deferred Action 3 (LB reach): deferred   Provocative Test(s):  Phalen's test: deferred Tinel's test: deferred Apley's scratch test (touch opposite  shoulder):  Action 1 (Across chest): deferred Action 2 (Overhead): deferred Action 3 (LB reach): deferred    Thoracic Spine Area Exam  Skin & Axial Inspection:No masses, redness, or swelling Alignment:Symmetrical Functional VQM:[id number] ROM Stability:No instability detected Muscle Tone/Strength:Functionally intact. No obvious neuro-muscular anomalies detected. Sensory (Neurological):tenderness of left back and flank region T4-T6 dermatome with allodynia of left thoracic back, at inferior scapular border Muscle strength & Tone:Tender  Lumbar Spine Area Exam  Skin & Axial Inspection:No masses, redness, or swelling Alignment:Symmetrical Functional IZT:[id number] ROM Stability:No instability detected Muscle  Tone/Strength:Functionally intact. No obvious neuro-muscular anomalies detected. Sensory (Neurological):Unimpaired Palpation:No palpable anomalies Provocative Tests: Lumbar Hyperextension and rotation test:Positivebilaterally for facet joint pain. Lumbar Lateral bending test:Positive Patrick's Maneuver:evaluation deferred today    Gait & Posture Assessment  Ambulation: Patient came in today in a wheel chair Gait: Limited. Using assistive device to ambulate Posture: Difficulty standing up straight, due to pain   Lower Extremity Exam    Side: Right lower extremity  Side: Left lower extremity  Stability: No instability observed          Stability: No instability observed          Skin & Extremity Inspection: Skin color, temperature, and hair growth are WNL. No peripheral edema or cyanosis. No masses, redness, swelling, asymmetry, or associated skin lesions. No contractures.  Skin & Extremity Inspection: Skin color, temperature, and hair growth are WNL. No peripheral edema or cyanosis. No masses, redness, swelling, asymmetry, or associated skin lesions. No contractures.  Functional ROM: Unrestricted ROM                  Functional ROM: Unrestricted ROM                  Muscle Tone/Strength: Functionally intact. No obvious neuro-muscular anomalies detected.  Muscle Tone/Strength: Functionally intact. No obvious neuro-muscular anomalies detected.  Sensory (Neurological): Unimpaired  Sensory (Neurological): Unimpaired  Palpation: No palpable anomalies  Palpation: No palpable anomalies     Assessment  Primary Diagnosis & Pertinent Problem List: The primary encounter diagnosis was [name] herpetic neuralgia. Diagnoses of Thoracic radiculopathy, Chronic pain syndrome, Chronic use of opiate for therapeutic purpose, and Neuropathic pain were also pertinent to this visit.  Status Diagnosis  Persistent Persistent Persistent 1. [name] herpetic neuralgia    2. Thoracic radiculopathy   3. Chronic pain syndrome   4. Chronic use of opiate for therapeutic purpose   5. Neuropathic pain      General Recommendations: The pain condition that the patient suffers from is best treated with a multidisciplinary approach that involves an increase in physical activity to prevent de-conditioning and worsening of the pain cycle, as well as psychological counseling (formal and/or informal) to address the co-morbid psychological affects of pain. Treatment will often involve judicious use of pain medications and interventional procedures to decrease the pain, allowing the patient to participate in the physical activity that will ultimately produce long-lasting pain reductions. The goal of the multidisciplinary approach is to return the patient to a higher level of overall function and to restore their ability to perform activities of daily living.  72 year old female with a history of left flank and chest pain secondary to postherpetic neuralgia in 2013 who presents formed refill/chronic pain management. Patient was previously being seen at the [facility]. Her symptoms were managed on [place] film:Belbuca600 mcg twice daily along with gabapentin 2400 mg daily. She states that the medications allow her to function and improve her neuropathic pain symptoms. She  is status [name] bilateral T5-T6 transforaminal epidural steroid injection in June 2018 with no pain relief and also status [name] DRG spinal cord stimulator trial on Aug 16, 2016 without any significant pain relief.  Patient follows up today for medication management. Patient is interested in weaning her Belbuca further. At her last visit we weaned her belbuca from 450 mcg twice daily to 300 mcg twice daily.  She states that she is tolerating the wean fine without any issues.  She does not endorse any worsening of pain with the wean.  We will reduce her belbuca to 150 mcg twice daily.  She was  started Lyrica 100 mg TID at her last visit (after discontinuation of gabapentin) and finds this beneficial although is endorsing sedation.  I recommend she reduce her Lyrica intake to 100 mg twice daily and see if that improves side effects of sedation.  Plan of Care  Pharmacotherapy (Medications Ordered): Meds ordered this encounter  Medications  . Buprenorphine HCl (BELBUCA) 150 MCG FILM    Sig: Place 150 mcg inside cheek every 12 (twelve) hours.    Dispense:  60 each    Refill:  1   Future considerations:T5-T6 facet injections  Time Note:Greater than 50% of the 35mnute(s) of face-to-face time spent with AutumnChandler was spent in counseling/coordination of care regarding:opioid tolerance,AutumnChandler's primary cause of pain, the treatment plan, treatment alternatives, the risks and possible complications of proposed treatment, medication side effects, the appropriate use ofhermedications, realistic expectations and the medication agreement..  Provider-requested follow-up: Return in about 8 weeks (around 02/21/2018) for Medication Management.  No future appointments.  Primary Care Physician: JBaxter Hire [facility] Note by: BGillis Santa M.D Date: 12/27/2017; Time: 3:01 PM  Patient Instructions     1.  We will continue with belbuca wean.  Reduce to 150 mcg twice daily. 2.  Given sedation at your current Lyrica dose, reduce to 100 mg twice daily.   Pick up electronic prescription for Belbuca at your pharmacy.         Medication Rules  Applies to: All patients receiving prescriptions (written or electronic).  Pharmacy of record: Pharmacy where electronic prescriptions will be sent. If written prescriptions are taken to a different pharmacy, please inform the nursing staff. The pharmacy listed in the electronic medical record should be the one where you would like electronic prescriptions to be sent.  Prescription  refills: Only during scheduled appointments. Applies to both, written and electronic prescriptions.  NOTE: The following applies primarily to controlled substances (Opioid* Pain Medications).   Patient's responsibilities: 1. Pain Pills: Bring all pain pills to every appointment (except for procedure appointments). 2. Pill Bottles: Bring pills in original pharmacy bottle. Always bring newest bottle. Bring bottle, even if empty. 3. Medication refills: You are responsible for knowing and keeping track of what medications you need refilled. The day before your appointment, write a list of all prescriptions that need to be refilled. Bring that list to your appointment and give it to the admitting nurse. Prescriptions will be written only during appointments. If you forget a medication, it will not be "Called in", "Faxed", or "electronically sent". You will need to get another appointment to get these prescribed. 4. Prescription Accuracy: You are responsible for carefully inspecting your prescriptions before leaving our office. Have the discharge nurse carefully go over each prescription with you, before taking them home. Make sure that your name is accurately spelled, that your address is correct. Check the name  and dose of your medication to make sure it is accurate. Check the number of pills, and the written instructions to make sure they are clear and accurate. Make sure that you are given enough medication to last until your next medication refill appointment. 5. Taking Medication: Take medication as prescribed. Never take more pills than instructed. Never take medication more frequently than prescribed. Taking less pills or less frequently is permitted and encouraged, when it comes to controlled substances (written prescriptions).  6. Inform other Doctors: Always inform, all of your healthcare providers, of all the medications you take. 7. Pain Medication from other Providers: You are not allowed to  accept any additional pain medication from any other Doctor or Healthcare provider. There are two exceptions to this rule. (see below) In the event that you require additional pain medication, you are responsible for notifying us, as stated below. 8. Medication Agreement: You are responsible for carefully reading and following our Medication Agreement. This must be signed before receiving any prescriptions from our practice. Safely store a copy of your signed Agreement. Violations to the Agreement will result in no further prescriptions. (Additional copies of our Medication Agreement are available upon request.) 9. Laws, Rules, & Regulations: All patients are expected to follow all Federal and [employer], [employer], Rules, [employer]. Ignorance of the Laws does not constitute a valid excuse. The use of any illegal substances is prohibited. 10. Adopted CDC guidelines & recommendations: Target dosing levels will be at or below 60 MME/day. Use of benzodiazepines** is not recommended.  Exceptions: There are only two exceptions to the rule of not receiving pain medications from other Healthcare Providers. 1. Exception #1 (Emergencies): In the event of an emergency (i.e.: accident requiring emergency care), you are allowed to receive additional pain medication. However, you are responsible for: As soon as you are able, call our office [phone], at any time of the day or night, and leave a message stating your name, the date and nature of the emergency, and the name and dose of the medication prescribed. In the event that your call is answered by a member of our staff, make sure to document and save the date, time, and the name of the person that took your information.  2. Exception #2 (Planned Surgery): In the event that you are scheduled by another doctor or dentist to have any type of surgery or procedure, you are allowed (for a period no longer than 30 days), to receive additional pain medication, for  the acute [name]-op pain. However, in this case, you are responsible for picking up a copy of our "[name]-op Pain Management for Surgeons" handout, and giving it to your surgeon or dentist. This document is available at our office, and does not require an appointment to obtain it. Simply go to our office during business hours (Monday-Thursday from 8:00 AM to 4:00 PM) (Friday 8:00 AM to 12:00 Noon) or if you have a scheduled appointment with [place], prior to your surgery, and ask for it by name. In addition, you will need to provide [place] with your name, name of your surgeon, type of surgery, and date of procedure or surgery.  *Opioid medications include: morphine, codeine, oxycodone, oxymorphone, hydrocodone, hydromorphone, meperidine, tramadol, tapentadol, buprenorphine, fentanyl, methadone. **Benzodiazepine medications include: diazepam (Valium), alprazolam (Xanax), clonazepam (Klonopine), lorazepam (Ativan), clorazepate (Tranxene), chlordiazepoxide (Librium), estazolam (Prosom), oxazepam (Serax), temazepam (Restoril), triazolam (Halcion) (Last updated: 06/09/2017)

## 2017-12-27 NOTE — Progress Notes (Signed)
Safety precautions to be maintained throughout the outpatient stay will include: orient to surroundings, keep bed in low position, maintain call bell within reach at all times, provide assistance with transfer out of bed and ambulation.   Nursing Pain Medication Assessment:  Safety precautions to be maintained throughout the outpatient stay will include: orient to surroundings, keep bed in low position, maintain call bell within reach at all times, provide assistance with transfer out of bed and ambulation.  Medication Inspection Compliance: Autumn [name] did not comply with our request to bring her pills to be counted. She was reminded that bringing the medication bottles, even when empty, is a requirement.  Medication: None brought in. (Belbuca)  Pill/Patch Count: None available to be counted. Bottle Appearance: No container available. Did not bring bottle(s) to appointment. Filled Date: N/A Last Medication intake:  Ran out of medicine more than 48 hours ago

## 2018-02-04 ENCOUNTER — Encounter: Payer: Self-pay | Admitting: Emergency Medicine

## 2018-02-04 ENCOUNTER — Inpatient Hospital Stay
Admission: EM | Admit: 2018-02-04 | Discharge: 2018-02-15 | DRG: 190 | Disposition: A | Discharge status: Skilled Nursing Facility | Payer: Medicare Other | Attending: Internal Medicine | Admitting: Internal Medicine

## 2018-02-04 ENCOUNTER — Emergency Department: Payer: Medicare Other

## 2018-02-04 DIAGNOSIS — Z9981 Dependence on supplemental oxygen: Secondary | ICD-10-CM

## 2018-02-04 DIAGNOSIS — D649 Anemia, unspecified: Secondary | ICD-10-CM | POA: Diagnosis present

## 2018-02-04 DIAGNOSIS — Z515 Encounter for palliative care: Secondary | ICD-10-CM | POA: Diagnosis not present

## 2018-02-04 DIAGNOSIS — J9601 Acute respiratory failure with hypoxia: Secondary | ICD-10-CM | POA: Diagnosis not present

## 2018-02-04 DIAGNOSIS — J9610 Chronic respiratory failure, unspecified whether with hypoxia or hypercapnia: Secondary | ICD-10-CM

## 2018-02-04 DIAGNOSIS — I6783 Posterior reversible encephalopathy syndrome: Secondary | ICD-10-CM | POA: Diagnosis present

## 2018-02-04 DIAGNOSIS — Z7902 Long term (current) use of antithrombotics/antiplatelets: Secondary | ICD-10-CM

## 2018-02-04 DIAGNOSIS — G894 Chronic pain syndrome: Secondary | ICD-10-CM | POA: Diagnosis present

## 2018-02-04 DIAGNOSIS — J9621 Acute and chronic respiratory failure with hypoxia: Secondary | ICD-10-CM | POA: Diagnosis present

## 2018-02-04 DIAGNOSIS — D860 Sarcoidosis of lung: Secondary | ICD-10-CM | POA: Diagnosis present

## 2018-02-04 DIAGNOSIS — I272 Pulmonary hypertension, unspecified: Secondary | ICD-10-CM | POA: Diagnosis present

## 2018-02-04 DIAGNOSIS — Z681 Body mass index (BMI) 19 or less, adult: Secondary | ICD-10-CM

## 2018-02-04 DIAGNOSIS — Z7951 Long term (current) use of inhaled steroids: Secondary | ICD-10-CM

## 2018-02-04 DIAGNOSIS — Z7982 Long term (current) use of aspirin: Secondary | ICD-10-CM

## 2018-02-04 DIAGNOSIS — Z79899 Other long term (current) drug therapy: Secondary | ICD-10-CM | POA: Diagnosis not present

## 2018-02-04 DIAGNOSIS — J441 Chronic obstructive pulmonary disease with (acute) exacerbation: Principal | ICD-10-CM | POA: Diagnosis present

## 2018-02-04 DIAGNOSIS — I5020 Unspecified systolic (congestive) heart failure: Secondary | ICD-10-CM | POA: Diagnosis present

## 2018-02-04 DIAGNOSIS — Z7952 Long term (current) use of systemic steroids: Secondary | ICD-10-CM | POA: Diagnosis not present

## 2018-02-04 DIAGNOSIS — I110 Hypertensive heart disease with heart failure: Secondary | ICD-10-CM | POA: Diagnosis present

## 2018-02-04 DIAGNOSIS — J189 Pneumonia, unspecified organism: Secondary | ICD-10-CM

## 2018-02-04 DIAGNOSIS — J9600 Acute respiratory failure, unspecified whether with hypoxia or hypercapnia: Secondary | ICD-10-CM | POA: Diagnosis present

## 2018-02-04 DIAGNOSIS — J9602 Acute respiratory failure with hypercapnia: Secondary | ICD-10-CM | POA: Diagnosis not present

## 2018-02-04 DIAGNOSIS — Z8543 Personal history of malignant neoplasm of ovary: Secondary | ICD-10-CM

## 2018-02-04 DIAGNOSIS — M810 Age-related osteoporosis without current pathological fracture: Secondary | ICD-10-CM | POA: Diagnosis present

## 2018-02-04 DIAGNOSIS — Z90710 Acquired absence of both cervix and uterus: Secondary | ICD-10-CM | POA: Diagnosis not present

## 2018-02-04 DIAGNOSIS — E869 Volume depletion, unspecified: Secondary | ICD-10-CM | POA: Diagnosis present

## 2018-02-04 DIAGNOSIS — J440 Chronic obstructive pulmonary disease with acute lower respiratory infection: Secondary | ICD-10-CM | POA: Diagnosis present

## 2018-02-04 DIAGNOSIS — E785 Hyperlipidemia, unspecified: Secondary | ICD-10-CM | POA: Diagnosis present

## 2018-02-04 DIAGNOSIS — H409 Unspecified glaucoma: Secondary | ICD-10-CM | POA: Diagnosis present

## 2018-02-04 DIAGNOSIS — J9622 Acute and chronic respiratory failure with hypercapnia: Secondary | ICD-10-CM | POA: Diagnosis present

## 2018-02-04 DIAGNOSIS — Z66 Do not resuscitate: Secondary | ICD-10-CM | POA: Diagnosis present

## 2018-02-04 DIAGNOSIS — I5021 Acute systolic (congestive) heart failure: Secondary | ICD-10-CM | POA: Diagnosis not present

## 2018-02-04 DIAGNOSIS — E43 Unspecified severe protein-calorie malnutrition: Secondary | ICD-10-CM | POA: Diagnosis present

## 2018-02-04 DIAGNOSIS — F419 Anxiety disorder, unspecified: Secondary | ICD-10-CM | POA: Diagnosis present

## 2018-02-04 DIAGNOSIS — E870 Hyperosmolality and hypernatremia: Secondary | ICD-10-CM | POA: Diagnosis not present

## 2018-02-04 DIAGNOSIS — J9811 Atelectasis: Secondary | ICD-10-CM

## 2018-02-04 LAB — BASIC METABOLIC PANEL
Anion gap: 10 (ref 5–15)
BUN: 21 mg/dL (ref 8–23)
CO2: 35 mmol/L — ABNORMAL HIGH (ref 22–32)
Calcium: 9.8 mg/dL (ref 8.9–10.3)
Chloride: 98 mmol/L (ref 98–111)
Creatinine, Ser: 0.66 mg/dL (ref 0.44–1.00)
GFR calc Af Amer: >60 mL/min (ref >60)
GFR calc non Af Amer: >60 mL/min (ref >60)
Glucose, Bld: 90 mg/dL (ref 70–99)
Potassium: 3.7 mmol/L (ref 3.5–5.1)
Sodium: 143 mmol/L (ref 135–145)

## 2018-02-04 LAB — CBC
HCT: 38.8 % (ref 36.0–46.0)
Hemoglobin: 11.4 g/dL — ABNORMAL LOW (ref 12.0–15.0)
MCH: 26.4 pg (ref 26.0–34.0)
MCHC: 29.4 g/dL — ABNORMAL LOW (ref 30.0–36.0)
MCV: 89.8 fL (ref 80.0–100.0)
Platelets: 302 10*3/uL (ref 150–400)
RBC: 4.32 MIL/uL (ref 3.87–5.11)
RDW: 13 % (ref 11.5–15.5)
WBC: 13 10*3/uL — ABNORMAL HIGH (ref 4.0–10.5)
nRBC: 0 % (ref 0.0–0.2)

## 2018-02-04 LAB — BRAIN NATRIURETIC PEPTIDE: B Natriuretic Peptide: 53 pg/mL (ref 0.0–100.0)

## 2018-02-04 LAB — TROPONIN I: Troponin I: <0.03 ng/mL (ref <0.03)

## 2018-02-04 LAB — FIBRIN DERIVATIVES D-DIMER (ARMC ONLY): Fibrin derivatives D-dimer (ARMC): 515.03 ng/mL (FEU) — ABNORMAL HIGH (ref 0.00–499.00)

## 2018-02-04 MED ORDER — METHYLPREDNISOLONE SODIUM SUCC 125 MG IJ SOLR
125.0000 mg | Freq: Once | INTRAMUSCULAR | Status: AC
  Administered 2018-02-04: 125 mg via INTRAVENOUS
  Filled 2018-02-04: qty 2

## 2018-02-04 MED ORDER — IPRATROPIUM-ALBUTEROL 0.5-2.5 (3) MG/3ML IN SOLN
3.0000 mL | Freq: Once | RESPIRATORY_TRACT | Status: AC
  Administered 2018-02-04: 3 mL via RESPIRATORY_TRACT
  Filled 2018-02-04: qty 3

## 2018-02-04 MED ORDER — LABETALOL HCL 5 MG/ML IV SOLN
10.0000 mg | Freq: Once | INTRAVENOUS | Status: AC
  Administered 2018-02-04: 10 mg via INTRAVENOUS
  Filled 2018-02-04: qty 4

## 2018-02-04 MED ORDER — ALBUTEROL SULFATE (2.5 MG/3ML) 0.083% IN NEBU
5.0000 mg | INHALATION_SOLUTION | Freq: Once | RESPIRATORY_TRACT | Status: AC
  Administered 2018-02-04: 5 mg via RESPIRATORY_TRACT
  Filled 2018-02-04: qty 6

## 2018-02-04 NOTE — ED Triage Notes (Signed)
PT arrives with complaints of shortness of breath for the last two weeks. PT denies new complaints today but states "it just aint getting better." Inspiratory and expiratory wheezes heard on auscultation. Lower left and right lungs diminished. Pt wears 2 lL  Chronically but states today she moved her oxygen level to 3L. Pt states she took albuterol treatment this am. No complaints of chest pain or acute pain. Pt reports non productive cough.

## 2018-02-04 NOTE — ED Notes (Signed)
Pt to CT

## 2018-02-04 NOTE — H&P (Signed)
[facility] [place] NAME: Autumn [name]    MR#:  [id number]  DATE OF BIRTH:  01-Jun-1945  DATE OF ADMISSION:  02/04/2018  PRIMARY CARE PHYSICIAN: Baxter Hire, MD   REQUESTING/REFERRING PHYSICIAN:   CHIEF COMPLAINT:   Chief Complaint  Patient presents with  . Shortness of Breath    HISTORY OF PRESENT ILLNESS: Autumn [name]  is a 72 y.o. female with a known history of pulmonary sarcoidosis, COPD, chronic respiratory failure on continuous 2 L oxygen per nasal cannula at home. Patient was brought to emergency room for worsening fatigue, generalized weakness, shortness of breath and wheezing going on for the past 2 weeks, gradually getting worse.  She has been using inhalers, at home, more frequently, without any improvement.  Her prednisone dose was increased from 10 mg to 20 mg daily by her pulmonologist Dr. Raul Del, 3 days ago, again without any improvement.  Patient went up and her oxygen from 2 L to 3 L per nasal cannula over the past 2 weeks. She admits to chronic dry cough, but no fever, chills, nausea, vomiting, diarrhea, bleeding. At the arrival to emergency room patient was noted with oxygen saturation at 70% on 3 L per nasal cannula.  She is currently on 6 L to maintain oxygen saturation around 92%. Blood test done emergency room, including CBC and CMP are grossly unremarkable except for borderline elevation in WBC at 13,000.  Troponin level is less than 0.03. EKG shows normal sinus rhythm with heart rate is 76 bpm, normal intervals, normal axis, no acute ischemic changes. Chest x-ray and CT of the chest, without contrast show significant areas of chronic lung scarring, greatest in the left upper lobe, stable from the prior studies, clear related to sarcoidosis.  No acute changes. Unfortunately, patient is severely allergic to dye, so IV contrast is contraindicated. Patient is admitted for further evaluation and treatment.  PAST  MEDICAL HISTORY:   Past Medical History:  Diagnosis Date  . Allergy   . COPD (chronic obstructive pulmonary disease) ([place])   . Glaucoma   . Hypertension   . Osteoporosis   . Ovarian cancer ([place])   . Sarcoid     PAST SURGICAL HISTORY:  Past Surgical History:  Procedure Laterality Date  . ABDOMINAL HYSTERECTOMY    . BREAST BIOPSY Right     SOCIAL HISTORY:  Social History   Tobacco Use  . Smoking status: Never Smoker  . Smokeless tobacco: Never Used  Substance Use Topics  . Alcohol use: No    Frequency: Never    FAMILY HISTORY:  Family History  Problem Relation Age of Onset  . Breast cancer Mother 80  . Cancer Mother   . Hypertension Father     DRUG ALLERGIES:  Allergies  Allergen Reactions  . Ivp Dye [Iodinated Diagnostic Agents] Hives and Itching    REVIEW OF SYSTEMS:   CONSTITUTIONAL: No fever, positive for fatigue and generalized weakness.  EYES: No changes in vision.  EARS, NOSE, AND THROAT: No tinnitus or ear pain.  RESPIRATORY: Positive for chronic dry cough, shortness of breath, wheezing; no hemoptysis.  CARDIOVASCULAR: No chest pain, orthopnea, edema.  GASTROINTESTINAL: No nausea, vomiting, diarrhea or abdominal pain.  GENITOURINARY: No dysuria, hematuria.  ENDOCRINE: No polyuria, nocturia. HEMATOLOGY: No bleeding. SKIN: No rash or lesion. MUSCULOSKELETAL: No joint pain at this time.   NEUROLOGIC: No focal weakness.  PSYCHIATRY: Positive history of anxiety and depression disorder.   MEDICATIONS AT HOME:  Prior to Admission medications   Medication Sig Start Date End Date Taking? Authorizing Provider  albuterol (PROVENTIL) (2.5 MG/3ML) 0.083% nebulizer solution Take 2.5 mg by nebulization every 4 (four) hours as needed.  07/24/15   [provider]  atenolol (TENORMIN) 25 MG tablet Take 1 tablet by mouth 2 (two) times daily. 07/29/15   [provider]  atorvastatin (LIPITOR) 10 MG tablet Take 1 tablet by mouth daily at 6 PM. 02/14/15    [provider]  Brinzolamide-Brimonidine 1-0.2 % SUSP Apply 1 drop to eye 3 (three) times daily.    [provider]  Buprenorphine HCl (BELBUCA) 150 MCG FILM Place 150 mcg inside cheek every 12 (twelve) hours. 12/27/17   Gillis Santa, MD  CALCIUM 600+D3 600-800 MG-UNIT TABS Take 1 tablet by mouth 2 (two) times daily with a meal. 10/23/15   [provider]  denosumab (PROLIA) 60 MG/ML SOLN injection Inject into the skin. 04/27/17   [provider]  Fluticasone-Salmeterol (ADVAIR DISKUS) 250-50 MCG/DOSE AEPB Inhale into the lungs. 08/18/15   [provider]  ibuprofen (ADVIL,MOTRIN) 200 MG tablet Take 200 mg by mouth every 6 (six) hours as needed.    [provider]  Ibuprofen-Diphenhydramine HCl (ADVIL PM) 200-25 MG CAPS Take 1 tablet by mouth at bedtime as needed.    [provider]  latanoprost (XALATAN) 0.005 % ophthalmic solution Apply 1 drop to eye at bedtime. 07/20/13   [provider]  lidocaine (XYLOCAINE) 5 % ointment Apply 1 application topically 4 (four) times daily as needed for moderate pain. 11/29/17 02/27/18  Gillis Santa, MD  lidocaine-prilocaine (EMLA) cream Apply 1 application topically 2 (two) times daily as needed.    [provider]  mirtazapine (REMERON) 7.5 MG tablet every evening. 04/29/17   [provider]  Omega-3 Fatty Acids (FISH OIL PO) Take 1 capsule by mouth every evening.    [provider]  predniSONE (DELTASONE) 10 MG tablet  06/20/17   [provider]  predniSONE (DELTASONE) 10 MG tablet Take 10 mg by mouth daily with breakfast.  05/05/17   [provider]  pregabalin (LYRICA) 100 MG capsule Take 1 capsule (100 mg total) by mouth 3 (three) times daily. 11/22/17   Gillis Santa, MD  sertraline (ZOLOFT) 25 MG tablet Take 25 mg by mouth daily. 02/16/17   [provider]  tiotropium (SPIRIVA HANDIHALER) 18 MCG inhalation capsule Place 1 capsule into  inhaler and inhale daily. 07/24/15   [provider]  tiZANidine (ZANAFLEX) 4 MG tablet Take 1 tablet (4 mg total) by mouth 2 (two) times daily as needed for muscle spasms. 10/20/17 10/20/18  Gillis Santa, MD  traMADol (ULTRAM) 50 MG tablet Take 1 tablet by mouth 2 (two) times daily. 11/07/15   [provider]      PHYSICAL EXAMINATION:   VITAL SIGNS: Blood pressure (!) 199/87, pulse 90, temperature 97.9 F (36.6 C), temperature source Oral, resp. rate (!) 25, height 5\' 8"  (1.727 m), weight 54.4 kg, SpO2 (!) 87 %.  GENERAL:  72 y.o.-year-old patient lying in the bed with mild respiratory distress, at rest.  She looks chronically ill, exhausted. EYES: Pupils equal, round, reactive to light and accommodation. No scleral icterus. Extraocular muscles intact.  HEENT: Head atraumatic, normocephalic. Oropharynx and nasopharynx clear.  NECK:  Supple, no jugular venous distention. No thyroid enlargement, no tenderness.  LUNGS: Tachypneic.  On 6 L oxygen per nasal cannula.  Reduced breath sounds and wheezing noted bilaterally.  Mild to  moderate use of accessory muscles of respiration is noted.  CARDIOVASCULAR: S1, S2 normal. No S3/S4.  ABDOMEN: Soft, nontender, nondistended. Bowel sounds present. No organomegaly or mass.  EXTREMITIES: No pedal edema, cyanosis, or clubbing.  NEUROLOGIC: No focal weakness PSYCHIATRIC: The patient is alert and oriented x 3.  SKIN: No obvious rash, lesion, or ulcer.   LABORATORY PANEL:   CBC Recent Labs  Lab 02/04/18 1430  WBC 13.0*  HGB 11.4*  HCT 38.8  PLT 302  MCV 89.8  MCH 26.4  MCHC 29.4*  RDW 13.0   ------------------------------------------------------------------------------------------------------------------  Chemistries  Recent Labs  Lab 02/04/18 1430  NA 143  K 3.7  CL 98  CO2 35*  GLUCOSE 90  BUN 21  CREATININE 0.66  CALCIUM 9.8    ------------------------------------------------------------------------------------------------------------------ estimated creatinine clearance is 53 mL/min (by C-G formula based on SCr of 0.66 mg/dL). ------------------------------------------------------------------------------------------------------------------ No results for input(s): TSH, T4TOTAL, T3FREE, THYROIDAB in the last 72 hours.  Invalid input(s): FREET3   Coagulation profile No results for input(s): INR, PROTIME in the last 168 hours. ------------------------------------------------------------------------------------------------------------------- No results for input(s): DDIMER in the last 72 hours. -------------------------------------------------------------------------------------------------------------------  Cardiac Enzymes Recent Labs  Lab 02/04/18 1430  TROPONINI <0.03   ------------------------------------------------------------------------------------------------------------------ Invalid input(s): POCBNP  ---------------------------------------------------------------------------------------------------------------  Urinalysis    Component Value Date/Time   COLORURINE Yellow 12/15/2013 1649   APPEARANCEUR Clear 12/15/2013 1649   LABSPEC 1.010 12/15/2013 1649   PHURINE 7.0 12/15/2013 1649   GLUCOSEU Negative 12/15/2013 1649   HGBUR Negative 12/15/2013 1649   BILIRUBINUR Negative 12/15/2013 1649   KETONESUR Negative 12/15/2013 1649   PROTEINUR Negative 12/15/2013 1649   NITRITE Negative 12/15/2013 1649   LEUKOCYTESUR Trace 12/15/2013 1649     RADIOLOGY: Dg Chest 2 View  Result Date: 02/04/2018 CLINICAL DATA:  Increasing shortness of breath over the last 2 weeks. No cough, fever or chest pain. EXAM: CHEST - 2 VIEW COMPARISON:  CT, 07/20/2017.  Chest radiograph, 11/19/2015. FINDINGS: Cardiac silhouette is normal in size. No mediastinal or hilar masses. Chronic pleuroparenchymal scarring in  the upper lobes, left greater than right. There are associated calcifications in the left upper lobe. Mild scarring at the lung bases. No evidence of pneumonia or pulmonary edema. No pleural effusion or pneumothorax. Skeletal structures are demineralized. There is a scoliosis of the thoracolumbar spine. IMPRESSION: 1. No acute cardiopulmonary disease. 2. Significant areas of chronic lung scarring, greatest in the left upper lobe, stable from the prior studies. Electronically Signed   By: Lajean Manes M.D.   On: 02/04/2018 15:21    EKG: Orders placed or performed during the hospital encounter of 02/04/18  . ED EKG within 10 minutes  . ED EKG within 10 minutes  . EKG 12-Lead  . EKG 12-Lead    IMPRESSION AND PLAN:  1.  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  No signs of infectious process.  Will place patient on BiPAP, continue oxygen supplement.  We will start steroids IV and duo nebs.  Will rule out cardiac cause.  Continue to monitor patient on telemetry and follow troponin levels and check 2D echo to further evaluate the cardiac function.  Pulmonary service is consulted for further evaluation and treatment.  2.  Pulmonary sarcoidosis, progressing.  Will change prednisone to IV steroids.  Continue oxygen supplement.  Further recommendations, per pulmonary consultant.  3.  Acute COPD exacerbation.  We will treat with IV steroids, duo nebs,  oxygen supplement.  Will place patient on BiPAP and admit her to stepdown for close observation.  4.  Hypertension.  BP is elevated.  Will resume home BP medications and add PRN agents for better control.  5.  Hyperlipidemia, on statin.  All the records are reviewed and case discussed with ED and ICU providers. Management plans discussed with the patient, family and they are in agreement.  CODE STATUS: Full Code Status History     Date Active Date Inactive Code Status Order ID Comments User Context   11/17/2015 1929 11/21/2015 1558 Full Code [id number]  Epifanio Lesches, MD ED       TOTAL TIME TAKING CARE OF THIS PATIENT: 50 minutes.    Amelia Jo M.D on 02/04/2018 at 10:09 PM  Between 7am to 6pm - Pager - [phone]  After 6pm go to [email or web address] - password [email or web address] [facility] [place] [facility]  [phone]  CC: Primary care physician; Baxter Hire, MD

## 2018-02-04 NOTE — ED Notes (Signed)
pt assisted back to bed. Pt becoming extremely short of breath after walking. O2 up to 6l/min via [place]. O2 sats noted to be down to low 80's. Dr Alfred Levins informed.

## 2018-02-04 NOTE — ED Notes (Signed)
Cardiopulmonary in to place pt on Bi pap.

## 2018-02-04 NOTE — ED Notes (Signed)
Pt returned from CT °

## 2018-02-04 NOTE — ED Notes (Signed)
Pt assisted up to toilet 

## 2018-02-04 NOTE — ED Provider Notes (Signed)
[facility] Emergency Department Provider Note  ____________________________________________  Time seen: Approximately 7:38 PM  I have reviewed the triage vital signs and the nursing notes.   HISTORY  Chief Complaint Shortness of Breath   HPI Autumn [name] is a 72 y.o. 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  Patient reports 2 weeks of constant wheezing and shortness of breath.  Has been using her inhalers at home with no significant relief.  Saw her pulmonologist Dr. Raul Del 3 days ago and had her prednisone doubled from 10 mg daily to 20 mg daily.  She reports no relief of her symptoms.  She denies worsening symptoms.  No chest pain, no fever or chills, no nausea or vomiting.  No personal family history of blood clots, no recent travel immobilization, no leg pain or swelling, no hemoptysis.  She has been using her inhalers at home with no relief.  At times had to increase her baseline oxygen to 3L at home over the last 2 weeks.  She does have a dry cough however that is chronic.  Past Medical History:  Diagnosis Date  . Allergy   . COPD (chronic obstructive pulmonary disease) ([place])   . Glaucoma   . Hypertension   . Osteoporosis   . Ovarian cancer ([place])   . Sarcoid     Patient Active Problem List   Diagnosis Date Noted  . COPD (chronic obstructive pulmonary disease) ([place]) 03/22/2017  . Post herpetic neuralgia 07/30/2016  . Pneumonia 11/17/2015  . Long term current use of systemic steroids 02/14/2015  . Pulmonary hypertension ([place]) 10/05/2013  . 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 ABDOMINAL HYSTERECTOMY    . BREAST BIOPSY Right     Prior to Admission medications   Medication Sig Start Date End Date Taking? Authorizing Provider  albuterol (PROVENTIL) (2.5 MG/3ML) 0.083%  nebulizer solution Take 2.5 mg by nebulization every 4 (four) hours as needed.  07/24/15   [provider]  atenolol (TENORMIN) 25 MG tablet Take 1 tablet by mouth 2 (two) times daily. 07/29/15   [provider]  atorvastatin (LIPITOR) 10 MG tablet Take 1 tablet by mouth daily at 6 PM. 02/14/15   [provider]  Brinzolamide-Brimonidine 1-0.2 % SUSP Apply 1 drop to eye 3 (three) times daily.    [provider]  Buprenorphine HCl (BELBUCA) 150 MCG FILM Place 150 mcg inside cheek every 12 (twelve) hours. 12/27/17   Gillis Santa, MD  CALCIUM 600+D3 600-800 MG-UNIT TABS Take 1 tablet by mouth 2 (two) times daily with a meal. 10/23/15   [provider]  denosumab (PROLIA) 60 MG/ML SOLN injection Inject into the skin. 04/27/17   [provider]  Fluticasone-Salmeterol (ADVAIR DISKUS) 250-50 MCG/DOSE AEPB Inhale into the lungs. 08/18/15   [provider]  ibuprofen (ADVIL,MOTRIN) 200 MG tablet Take 200 mg by mouth every 6 (six) hours as needed.    [provider]  Ibuprofen-Diphenhydramine HCl (ADVIL PM) 200-25 MG CAPS Take 1 tablet by mouth at bedtime as needed.    [provider]  latanoprost (XALATAN) 0.005 % ophthalmic solution Apply 1 drop to eye at bedtime. 07/20/13   [provider]  lidocaine (XYLOCAINE) 5 % ointment Apply 1 application topically 4 (four) times daily as needed for moderate pain. 11/29/17 02/27/18  Gillis Santa, MD  lidocaine-prilocaine (  EMLA) cream Apply 1 application topically 2 (two) times daily as needed.    [provider]  mirtazapine (REMERON) 7.5 MG tablet every evening. 04/29/17   [provider]  Omega-3 Fatty Acids (FISH OIL PO) Take 1 capsule by mouth every evening.    [provider]  predniSONE (DELTASONE) 10 MG tablet  06/20/17   [provider]  predniSONE (DELTASONE) 10 MG tablet Take 10 mg by mouth daily with breakfast.  05/05/17   [provider]  pregabalin (LYRICA) 100 MG capsule Take 1 capsule (100 mg total) by mouth 3 (three) times daily. 11/22/17   Gillis Santa, MD  sertraline (ZOLOFT) 25 MG tablet Take 25 mg by mouth daily. 02/16/17   [provider]  tiotropium (SPIRIVA HANDIHALER) 18 MCG inhalation capsule Place 1 capsule into inhaler and inhale daily. 07/24/15   [provider]  tiZANidine (ZANAFLEX) 4 MG tablet Take 1 tablet (4 mg total) by mouth 2 (two) times daily as needed for muscle spasms. 10/20/17 10/20/18  Gillis Santa, MD  traMADol (ULTRAM) 50 MG tablet Take 1 tablet by mouth 2 (two) times daily. 11/07/15   [provider]    Allergies Ivp dye [iodinated diagnostic agents]  Family History  Problem Relation Age of Onset  . Breast cancer Mother 85  . Cancer Mother   . Hypertension Father     Social History Social History   Tobacco Use  . Smoking status: Never Smoker  . Smokeless tobacco: Never Used  Substance Use Topics  . Alcohol use: No    Frequency: Never  . Drug use: No    Review of Systems  Constitutional: Negative for fever. Eyes: Negative for visual changes. ENT: Negative for sore throat. Neck: No neck pain  Cardiovascular: Negative for chest pain. Respiratory: + shortness of breath, wheezing, cough Gastrointestinal: Negative for abdominal pain, vomiting or diarrhea. Genitourinary: Negative for dysuria. Musculoskeletal: Negative for back pain. Skin: Negative for rash. Neurological: Negative for headaches, weakness or numbness. Psych: No SI or HI  ____________________________________________   PHYSICAL EXAM:  VITAL SIGNS: ED Triage Vitals  Enc Vitals Group     BP 02/04/18 1421 (!) 192/76     Pulse Rate 02/04/18 1421 73     Resp 02/04/18 1421 (!) 22     Temp 02/04/18 1421 98.4 F (36.9 C)     Temp Source 02/04/18 1421 Oral     SpO2 02/04/18 1421 99 %     Weight 02/04/18 1424 120 lb (54.4 kg)     Height 02/04/18 1424 5\' 8"  (1.727 m)      Head Circumference --      Peak Flow --      Pain Score 02/04/18 1729 2     Pain Loc --      Pain Edu? --      Excl. in [place]? --     Constitutional: Alert and oriented. Well appearing and in no apparent distress. HEENT:      Head: Normocephalic and atraumatic.         Eyes: Conjunctivae are normal. Sclera is non-icteric.       Mouth/Throat: Mucous membranes are moist.       Neck: Supple with no signs of meningismus. Cardiovascular: Regular rate and rhythm. No murmurs, gallops, or rubs. 2+ symmetrical distal pulses are present in all extremities. No JVD. Respiratory: Increased work of breathing, tachypneic, satting well on 2 L nasal cannula, diffuse expiratory wheezes and crackles bilaterally with diminished aeration Gastrointestinal:  Soft, non tender, and non distended with positive bowel sounds. No rebound or guarding. Musculoskeletal: Nontender with normal range of motion in all extremities. No edema, cyanosis, or erythema of extremities. Neurologic: Normal speech and language. Face is symmetric. Moving all extremities. No gross focal neurologic deficits are appreciated. Skin: Skin is warm, dry and intact. No rash noted. Psychiatric: Mood and affect are normal. Speech and behavior are normal.  ____________________________________________   LABS (all labs ordered are listed, but only abnormal results are displayed)  Labs Reviewed  BASIC METABOLIC PANEL - Abnormal; Notable for the following components:      Result Value   CO2 35 (*)    All other components within normal limits  CBC - Abnormal; Notable for the following components:   WBC 13.0 (*)    Hemoglobin 11.4 (*)    MCHC 29.4 (*)    All other components within normal limits  FIBRIN DERIVATIVES D-DIMER ([facility] ONLY) - Abnormal; Notable for the following components:   Fibrin derivatives D-dimer ([facility]) 515.03 (*)    All other components within normal limits  TROPONIN I  BRAIN NATRIURETIC PEPTIDE    ____________________________________________  EKG  ED ECG REPORT I, Rudene Re, the attending physician, personally viewed and interpreted this ECG.  Normal sinus rhythm, rate of 76, normal intervals, normal axis, no ST elevations or depressions, Q waves in lead V3. Unchanged from prior ____________________________________________  RADIOLOGY  I have personally reviewed the images performed during this visit and I agree with the Radiologist's read.   Interpretation by Radiologist:  Dg Chest 2 View  Result Date: 02/04/2018 CLINICAL DATA:  Increasing shortness of breath over the last 2 weeks. No cough, fever or chest pain. EXAM: CHEST - 2 VIEW COMPARISON:  CT, 07/20/2017.  Chest radiograph, 11/19/2015. FINDINGS: Cardiac silhouette is normal in size. No mediastinal or hilar masses. Chronic pleuroparenchymal scarring in the upper lobes, left greater than right. There are associated calcifications in the left upper lobe. Mild scarring at the lung bases. No evidence of pneumonia or pulmonary edema. No pleural effusion or pneumothorax. Skeletal structures are demineralized. There is a scoliosis of the thoracolumbar spine. IMPRESSION: 1. No acute cardiopulmonary disease. 2. Significant areas of chronic lung scarring, greatest in the left upper lobe, stable from the prior studies. Electronically Signed   By: Lajean Manes M.D.   On: 02/04/2018 15:21     ____________________________________________   PROCEDURES  Procedure(s) performed: None Procedures Critical Care performed: yes  CRITICAL CARE Performed by: Rudene Re  ?  Total critical care time: 35 min  Critical care time was exclusive of separately billable procedures and treating other patients.  Critical care was necessary to treat or prevent imminent or life-threatening deterioration.  Critical care was time spent personally by me on the following activities: development of treatment plan with patient and/or  surrogate as well as nursing, discussions with consultants, evaluation of patient's response to treatment, examination of patient, obtaining history from patient or surrogate, ordering and performing treatments and interventions, ordering and review of laboratory studies, ordering and review of radiographic studies, pulse oximetry and re-evaluation of patient's condition. ____________________________________________   INITIAL IMPRESSION / ASSESSMENT AND PLAN / ED COURSE   72 y.o. 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 Symptoms constant for 2 weeks, not improving on higher dose of steroid started 3 days ago by pulmonologist. No flu like symptoms, no  fever.  Chest x-ray with no infiltrate.  Labs showing mild leukocytosis in the setting of recently increased dose of steroids.  Patient is afebrile, satting well on her home oxygen. Will give duoneb x 3 and give solumedrol. Will check for PE with a d-dimer since patient does not seem to be improving on steroids    _________________________ 9:12 PM on 02/04/2018 -----------------------------------------  D-dimer is negative when adjusted for age. Patient took a few steps from bed to toilet and became severely SOB, dested to  78% on 3L [place], O2 increased to 5L with resolution of hypoxia. Discussed with Dr. Olen Pel for admission for acute on chronic respiratory failure.   As part of my medical decision making, I reviewed the following data within the [place] notes reviewed and incorporated, Labs reviewed , EKG interpreted , Old EKG reviewed, Old chart reviewed, Radiograph reviewed , Discussed with admitting physician , Notes from prior ED visits and  Controlled Substance Database    Pertinent labs & imaging results that were available during my care of the patient were reviewed by me and considered in my  medical decision making (see chart for details).    ____________________________________________   FINAL CLINICAL IMPRESSION(S) / ED DIAGNOSES  Final diagnoses:  Acute on chronic respiratory failure with hypoxia (HCC)  COPD exacerbation (HCC)      NEW MEDICATIONS STARTED DURING THIS VISIT:  ED Discharge Orders    None       Note:  This document was prepared using Dragon voice recognition software and may include unintentional dictation errors.    Rudene Re, MD 02/04/18 2114

## 2018-02-05 ENCOUNTER — Inpatient Hospital Stay
Admit: 2018-02-05 | Discharge: 2018-02-05 | Disposition: A | Discharge status: Home / Self Care | Payer: Medicare Other | Attending: Internal Medicine | Admitting: Internal Medicine

## 2018-02-05 LAB — BLOOD GAS, ARTERIAL
Acid-Base Excess: 19 mmol/L — ABNORMAL HIGH (ref 0.0–2.0)
Bicarbonate: 45.9 mmol/L — ABNORMAL HIGH (ref 20.0–28.0)
Delivery systems: POSITIVE
Expiratory PAP: 5
FIO2: 75
Inspiratory PAP: 14
O2 Saturation: 99.9 %
Patient temperature: 37
pCO2 arterial: 63 mmHg — ABNORMAL HIGH (ref 32.0–48.0)
pH, Arterial: 7.47 — ABNORMAL HIGH (ref 7.350–7.450)
pO2, Arterial: 296 mmHg — ABNORMAL HIGH (ref 83.0–108.0)

## 2018-02-05 LAB — MRSA PCR SCREENING: MRSA by PCR: NEGATIVE

## 2018-02-05 LAB — CBC
HCT: 40.2 % (ref 36.0–46.0)
Hemoglobin: 11.9 g/dL — ABNORMAL LOW (ref 12.0–15.0)
MCH: 26.5 pg (ref 26.0–34.0)
MCHC: 29.6 g/dL — ABNORMAL LOW (ref 30.0–36.0)
MCV: 89.5 fL (ref 80.0–100.0)
Platelets: 284 10*3/uL (ref 150–400)
RBC: 4.49 MIL/uL (ref 3.87–5.11)
RDW: 13.1 % (ref 11.5–15.5)
WBC: 10.6 10*3/uL — ABNORMAL HIGH (ref 4.0–10.5)
nRBC: 0 % (ref 0.0–0.2)

## 2018-02-05 LAB — BASIC METABOLIC PANEL
Anion gap: 13 (ref 5–15)
BUN: 27 mg/dL — ABNORMAL HIGH (ref 8–23)
CO2: 33 mmol/L — ABNORMAL HIGH (ref 22–32)
Calcium: 9.3 mg/dL (ref 8.9–10.3)
Chloride: 97 mmol/L — ABNORMAL LOW (ref 98–111)
Creatinine, Ser: 0.9 mg/dL (ref 0.44–1.00)
GFR calc Af Amer: >60 mL/min (ref >60)
GFR calc non Af Amer: >60 mL/min (ref >60)
Glucose, Bld: 145 mg/dL — ABNORMAL HIGH (ref 70–99)
Potassium: 3.4 mmol/L — ABNORMAL LOW (ref 3.5–5.1)
Sodium: 143 mmol/L (ref 135–145)

## 2018-02-05 LAB — GLUCOSE, CAPILLARY
Glucose-Capillary: 110 mg/dL — ABNORMAL HIGH (ref 70–99)
Glucose-Capillary: 230 mg/dL — ABNORMAL HIGH (ref 70–99)

## 2018-02-05 LAB — MAGNESIUM: Magnesium: 2.3 mg/dL (ref 1.7–2.4)

## 2018-02-05 LAB — ECHOCARDIOGRAM COMPLETE
Height: 68 in
Weight: 1841.28 oz

## 2018-02-05 LAB — PROCALCITONIN: Procalcitonin: <0.1 ng/mL

## 2018-02-05 LAB — PHOSPHORUS: Phosphorus: 2.9 mg/dL (ref 2.5–4.6)

## 2018-02-05 LAB — TROPONIN I: Troponin I: 0.03 ng/mL (ref <0.03)

## 2018-02-05 MED ORDER — ACETAMINOPHEN 650 MG RE SUPP: 650.0000 mg | Freq: Four times a day (QID) | RECTAL | Status: DC | PRN

## 2018-02-05 MED ORDER — FUROSEMIDE 10 MG/ML IJ SOLN
40.0000 mg | Freq: Once | INTRAMUSCULAR | Status: AC
  Administered 2018-02-05: 40 mg via INTRAVENOUS
  Filled 2018-02-05: qty 4

## 2018-02-05 MED ORDER — METOPROLOL TARTRATE 5 MG/5ML IV SOLN
5.0000 mg | INTRAVENOUS | Status: DC | PRN
  Administered 2018-02-05 – 2018-02-07 (×3): 5 mg via INTRAVENOUS
  Filled 2018-02-05 (×2): qty 5

## 2018-02-05 MED ORDER — MIRTAZAPINE 15 MG PO TABS
7.5000 mg | ORAL_TABLET | Freq: Every day | ORAL | Status: DC
  Administered 2018-02-05 – 2018-02-14 (×11): 7.5 mg via ORAL
  Filled 2018-02-05 (×12): qty 1

## 2018-02-05 MED ORDER — DOCUSATE SODIUM 100 MG PO CAPS
100.0000 mg | ORAL_CAPSULE | Freq: Two times a day (BID) | ORAL | Status: DC
  Administered 2018-02-05 – 2018-02-15 (×15): 100 mg via ORAL
  Filled 2018-02-05 (×19): qty 1

## 2018-02-05 MED ORDER — ENSURE ENLIVE PO LIQD
237.0000 mL | Freq: Two times a day (BID) | ORAL | Status: DC
  Administered 2018-02-07 – 2018-02-08 (×3): 237 mL via ORAL

## 2018-02-05 MED ORDER — CALCIUM CARB-CHOLECALCIFEROL 600-800 MG-UNIT PO TABS: 1.0000 | ORAL_TABLET | Freq: Two times a day (BID) | ORAL | Status: DC

## 2018-02-05 MED ORDER — METHYLPREDNISOLONE SODIUM SUCC 125 MG IJ SOLR: 80.0000 mg | Freq: Four times a day (QID) | INTRAMUSCULAR | Status: DC

## 2018-02-05 MED ORDER — ORAL CARE MOUTH RINSE: 15.0000 mL | Freq: Two times a day (BID) | OROMUCOSAL | Status: DC

## 2018-02-05 MED ORDER — LATANOPROST 0.005 % OP SOLN
1.0000 [drp] | Freq: Every day | OPHTHALMIC | Status: DC
  Administered 2018-02-05 – 2018-02-07 (×2): 1 [drp] via OPHTHALMIC
  Filled 2018-02-05: qty 2.5

## 2018-02-05 MED ORDER — METOPROLOL TARTRATE 5 MG/5ML IV SOLN
INTRAVENOUS | Status: AC
  Filled 2018-02-05: qty 5

## 2018-02-05 MED ORDER — CHLORHEXIDINE GLUCONATE 0.12 % MT SOLN
15.0000 mL | Freq: Two times a day (BID) | OROMUCOSAL | Status: DC
  Administered 2018-02-05 – 2018-02-08 (×8): 15 mL via OROMUCOSAL
  Filled 2018-02-05 (×6): qty 15

## 2018-02-05 MED ORDER — HYDRALAZINE HCL 20 MG/ML IJ SOLN
20.0000 mg | Freq: Once | INTRAMUSCULAR | Status: AC
  Administered 2018-02-05: 20 mg via INTRAVENOUS
  Filled 2018-02-05: qty 1

## 2018-02-05 MED ORDER — SODIUM CHLORIDE 0.9 % IV SOLN
500.0000 mg | INTRAVENOUS | Status: DC
  Filled 2018-02-05: qty 500

## 2018-02-05 MED ORDER — TIOTROPIUM BROMIDE MONOHYDRATE 18 MCG IN CAPS
1.0000 | ORAL_CAPSULE | Freq: Every day | RESPIRATORY_TRACT | Status: DC
  Administered 2018-02-05 – 2018-02-06 (×2): 18 ug via RESPIRATORY_TRACT
  Filled 2018-02-05: qty 5

## 2018-02-05 MED ORDER — ATORVASTATIN CALCIUM 20 MG PO TABS
10.0000 mg | ORAL_TABLET | Freq: Every day | ORAL | Status: DC
  Administered 2018-02-05 – 2018-02-15 (×11): 10 mg via ORAL
  Filled 2018-02-05 (×11): qty 1

## 2018-02-05 MED ORDER — ONDANSETRON HCL 4 MG PO TABS: 4.0000 mg | ORAL_TABLET | Freq: Four times a day (QID) | ORAL | Status: DC | PRN

## 2018-02-05 MED ORDER — FLUTICASONE FUROATE-VILANTEROL 200-25 MCG/INH IN AEPB
1.0000 | INHALATION_SPRAY | Freq: Every day | RESPIRATORY_TRACT | Status: DC
  Administered 2018-02-05 – 2018-02-06 (×2): 1 via RESPIRATORY_TRACT
  Filled 2018-02-05: qty 28

## 2018-02-05 MED ORDER — ACETAMINOPHEN 325 MG PO TABS
650.0000 mg | ORAL_TABLET | Freq: Four times a day (QID) | ORAL | Status: DC | PRN
  Administered 2018-02-07 – 2018-02-08 (×2): 650 mg via ORAL
  Filled 2018-02-05 (×2): qty 2

## 2018-02-05 MED ORDER — CALCIUM CARBONATE-VITAMIN D 500-200 MG-UNIT PO TABS
1.0000 | ORAL_TABLET | Freq: Two times a day (BID) | ORAL | Status: DC
  Administered 2018-02-05 – 2018-02-15 (×19): 1 via ORAL
  Filled 2018-02-05 (×19): qty 1

## 2018-02-05 MED ORDER — OMEGA-3-ACID ETHYL ESTERS 1 G PO CAPS
1.0000 g | ORAL_CAPSULE | Freq: Every day | ORAL | Status: DC
  Administered 2018-02-05 – 2018-02-15 (×9): 1 g via ORAL
  Filled 2018-02-05 (×9): qty 1

## 2018-02-05 MED ORDER — FENTANYL CITRATE (PF) 100 MCG/2ML IJ SOLN: 12.5000 ug | INTRAMUSCULAR | Status: DC | PRN

## 2018-02-05 MED ORDER — SODIUM CHLORIDE 0.9 % IV SOLN
1.0000 g | INTRAVENOUS | Status: DC
  Administered 2018-02-05 – 2018-02-06 (×2): 1 g via INTRAVENOUS
  Filled 2018-02-05 (×2): qty 1

## 2018-02-05 MED ORDER — HYDROCODONE-ACETAMINOPHEN 5-325 MG PO TABS
1.0000 | ORAL_TABLET | ORAL | Status: DC | PRN
  Administered 2018-02-05 – 2018-02-07 (×5): 1 via ORAL
  Administered 2018-02-10 – 2018-02-11 (×3): 2 via ORAL
  Administered 2018-02-11: 1 via ORAL
  Administered 2018-02-11 – 2018-02-15 (×4): 2 via ORAL
  Filled 2018-02-05 (×3): qty 2
  Filled 2018-02-05 (×2): qty 1
  Filled 2018-02-05: qty 2
  Filled 2018-02-05 (×2): qty 1
  Filled 2018-02-05: qty 2
  Filled 2018-02-05: qty 1
  Filled 2018-02-05 (×2): qty 2
  Filled 2018-02-05: qty 1

## 2018-02-05 MED ORDER — PREGABALIN 50 MG PO CAPS
100.0000 mg | ORAL_CAPSULE | Freq: Three times a day (TID) | ORAL | Status: DC
  Administered 2018-02-05 – 2018-02-14 (×27): 100 mg via ORAL
  Filled 2018-02-05 (×27): qty 2

## 2018-02-05 MED ORDER — TRAZODONE HCL 50 MG PO TABS
25.0000 mg | ORAL_TABLET | Freq: Every evening | ORAL | Status: DC | PRN
  Administered 2018-02-09 – 2018-02-14 (×3): 25 mg via ORAL
  Filled 2018-02-05 (×3): qty 1

## 2018-02-05 MED ORDER — HEPARIN SODIUM (PORCINE) 5000 UNIT/ML IJ SOLN
5000.0000 [IU] | Freq: Three times a day (TID) | INTRAMUSCULAR | Status: DC
  Administered 2018-02-05 – 2018-02-15 (×31): 5000 [IU] via SUBCUTANEOUS
  Filled 2018-02-05 (×31): qty 1

## 2018-02-05 MED ORDER — BUPRENORPHINE HCL 150 MCG BU FILM: 150.0000 ug | ORAL_FILM | Freq: Two times a day (BID) | BUCCAL | Status: DC

## 2018-02-05 MED ORDER — ADULT MULTIVITAMIN W/MINERALS CH
1.0000 | ORAL_TABLET | Freq: Every day | ORAL | Status: DC
  Administered 2018-02-08 – 2018-02-15 (×8): 1 via ORAL
  Filled 2018-02-05 (×8): qty 1

## 2018-02-05 MED ORDER — LORAZEPAM 2 MG/ML IJ SOLN
INTRAMUSCULAR | Status: AC
  Filled 2018-02-05: qty 1

## 2018-02-05 MED ORDER — SODIUM CHLORIDE 0.9 % IV SOLN
500.0000 mg | INTRAVENOUS | Status: DC
  Administered 2018-02-05 – 2018-02-06 (×2): 500 mg via INTRAVENOUS
  Filled 2018-02-05 (×2): qty 500

## 2018-02-05 MED ORDER — SODIUM CHLORIDE 0.9 % IV SOLN
1.0000 g | INTRAVENOUS | Status: DC
  Filled 2018-02-05: qty 10

## 2018-02-05 MED ORDER — POTASSIUM CHLORIDE 10 MEQ/100ML IV SOLN
10.0000 meq | INTRAVENOUS | Status: AC
  Administered 2018-02-05 (×3): 10 meq via INTRAVENOUS
  Filled 2018-02-05 (×3): qty 100

## 2018-02-05 MED ORDER — BISACODYL 5 MG PO TBEC: 5.0000 mg | DELAYED_RELEASE_TABLET | Freq: Every day | ORAL | Status: DC | PRN

## 2018-02-05 MED ORDER — ATENOLOL 25 MG PO TABS: 25.0000 mg | ORAL_TABLET | Freq: Two times a day (BID) | ORAL | Status: DC

## 2018-02-05 MED ORDER — LORAZEPAM 2 MG/ML IJ SOLN
1.0000 mg | INTRAMUSCULAR | Status: DC | PRN
  Administered 2018-02-05: 1 mg via INTRAVENOUS

## 2018-02-05 MED ORDER — METHYLPREDNISOLONE SODIUM SUCC 40 MG IJ SOLR
40.0000 mg | Freq: Three times a day (TID) | INTRAMUSCULAR | Status: DC
  Administered 2018-02-05 – 2018-02-08 (×8): 40 mg via INTRAVENOUS
  Filled 2018-02-05 (×8): qty 1

## 2018-02-05 MED ORDER — SERTRALINE HCL 50 MG PO TABS
25.0000 mg | ORAL_TABLET | Freq: Every day | ORAL | Status: DC
  Administered 2018-02-05 – 2018-02-15 (×11): 25 mg via ORAL
  Filled 2018-02-05 (×11): qty 1

## 2018-02-05 MED ORDER — SODIUM CHLORIDE 0.9 % IV SOLN: Freq: Once | INTRAVENOUS | Status: DC

## 2018-02-05 MED ORDER — METHYLPREDNISOLONE SODIUM SUCC 125 MG IJ SOLR: 60.0000 mg | Freq: Two times a day (BID) | INTRAMUSCULAR | Status: DC

## 2018-02-05 MED ORDER — IPRATROPIUM-ALBUTEROL 0.5-2.5 (3) MG/3ML IN SOLN
3.0000 mL | Freq: Four times a day (QID) | RESPIRATORY_TRACT | Status: DC
  Administered 2018-02-05 – 2018-02-12 (×32): 3 mL via RESPIRATORY_TRACT
  Filled 2018-02-05 (×31): qty 3

## 2018-02-05 MED ORDER — ATENOLOL 25 MG PO TABS
25.0000 mg | ORAL_TABLET | Freq: Two times a day (BID) | ORAL | Status: DC
  Administered 2018-02-05 – 2018-02-07 (×5): 25 mg via ORAL
  Filled 2018-02-05 (×5): qty 1

## 2018-02-05 MED ORDER — ONDANSETRON HCL 4 MG/2ML IJ SOLN
4.0000 mg | Freq: Four times a day (QID) | INTRAMUSCULAR | Status: DC | PRN
  Administered 2018-02-05: 4 mg via INTRAVENOUS
  Filled 2018-02-05: qty 2

## 2018-02-05 MED ORDER — LIDOCAINE 5 % EX OINT
1.0000 "application " | TOPICAL_OINTMENT | Freq: Four times a day (QID) | CUTANEOUS | Status: DC | PRN
  Filled 2018-02-05: qty 35.44

## 2018-02-05 NOTE — Progress Notes (Signed)
Patient admitted via ER with increasing SOB x several days, not improved from increasing po steroids. On admission pt was clammy and had soaked a gown. Placed on BiPAP , given meds for HTN and IV lasix. Diuresed 800 mls via bedpan,  transisitioned to [place] 02 this am , currently on 3 L and sats 95-96%.

## 2018-02-05 NOTE — Progress Notes (Signed)
[place] Progress Note Patient Name: Autumn [name] DOB: 1945/12/02 MRN: [id number]   Date of Service  02/05/2018  HPI/Events of Note  72 yo female with PMH of COPD. Admitted with [facility]. Limited information. No H&P in chart at this time. VSS.   eICU Interventions  No new orders.      Intervention Category Evaluation Type: New Patient Evaluation  Lysle Dingwall 02/05/2018, 12:50 AM

## 2018-02-05 NOTE — Progress Notes (Addendum)
Late note 1704 patient complains of severe nausea. Given prn for nausea. BiPAP removed in anticipation of vomiting. Placed on 3 L nasal cannula.

## 2018-02-05 NOTE — Progress Notes (Signed)
1205 Patient very anxious and states she cant breathe. Oxygen saturations in the 70s.. Pulse ox probe changed. Oxygen turned up to 3 L nasal cannula. No success in increasing patients oxygenation. Patient more anxious. B/P up and heart rate up. Placed on BiPAP at 30% oxygen- no success in increasing oxygen saturation. FIO2 on BiPAP increased to 50% and RT called for help. BiPAP mask readjusted again. FIO2 increased on BiPAP to 70%. RT in room now to help with BiPAP. Dr.Samaan consulted. Given prn for anxiety and also for increased heart rate and blood pressure. BiPAP on 100% , patients oxygen saturations increasing and patient is less anxious.

## 2018-02-05 NOTE — Progress Notes (Signed)
Tried patient off BiPAP for 30 minutes. Patient unable to keep oxygen saturation above 84%. Placed back on BiPAP.

## 2018-02-05 NOTE — Progress Notes (Signed)
Initial Nutrition Assessment  DOCUMENTATION CODES:   Severe malnutrition in context of chronic illness, Underweight  INTERVENTION:  Recommend liberalizing diet to regular.  Provide Ensure Enlive po BID, each supplement provides 350 kcal and 20 grams of protein. Patient prefers vanilla.  Provide daily MVI.  Discussed increased needs for calories and protein related to sarcoidosis/COPD. Encouraged patient to increase to 2 bottles of Boost Plus daily at home and add high-calorie, high-protein snacks between meals.  NUTRITION DIAGNOSIS:   Severe Malnutrition related to chronic illness(sarcoidosis, COPD, hx ovarian cancer) as evidenced by severe fat depletion, severe muscle depletion.  GOAL:   Patient will meet greater than or equal to 90% of their needs  MONITOR:   PO intake, Supplement acceptance, Weight trends, Labs, I & O's  REASON FOR ASSESSMENT:   Malnutrition Screening Tool, Consult Assessment of nutrition requirement/status  ASSESSMENT:   72 year old female with PMHx of sarcoidosis, COPD, glaucoma, HTN, hx ovarian cancer, OP who is admitted with acute exacerbation of sarcoidosis, acute exacerbation of COPD, pulmonary hypertension.   Met with patient at bedside. She reports her appetite is good but she cannot gain her weight back. She lives at home with her husband. They both cook meals. She typically eats 3 meals per day. For breakfast she may have cereal, oatmeal, a bagel, or occasionally eggs with sausage or bacon. For lunch she has soup or a sandwich. For dinner she has meat or beans and potatoes. She also drinks a vanilla Boost Plus once daily and a whey protein shake once daily. She reports she is not eating well because she does not like the food.  Patient reports her UBW was around 140-150 lbs (63.6-68.2 kg). She reports she started losing weight 3-4 years ago when she was receiving treatment for ovarian cancer. Per chart patient was 67.6 kg on 03/29/2015 and 63.1 kg  on 11/21/2015. She was 55.8 kg on 06/21/2017 and is currently 52.2 kg (115.03 lbs). She has lost 3.6 kg (6.5% body weight) over the past 7 months, which is not significant for time frame.  Medications reviewed and include: Oscal with D 1 tablet BID, Colace 100 mg BID, Solu-Medrol 60 mg Q12hrs IV, Remeron 7.5 mg QHS, Lovaza 1 gram daily, sertraline 25 mg daily.  Labs reviewed: CBG 110-230, Potassium 3.4, Chloride 97, CO2 33, BUN 27.  Discussed with RN.  NUTRITION - FOCUSED PHYSICAL EXAM:    Most Recent Value  Orbital Region  Severe depletion  Upper Arm Region  Severe depletion  Thoracic and Lumbar Region  Severe depletion  Buccal Region  Severe depletion  Temple Region  Severe depletion  Clavicle Bone Region  Severe depletion  Clavicle and Acromion Bone Region  Severe depletion  Scapular Bone Region  Severe depletion  Dorsal Hand  Severe depletion  Patellar Region  Severe depletion  Anterior Thigh Region  Severe depletion  Posterior Calf Region  Severe depletion  Edema (RD Assessment)  None  Hair  Reviewed  Eyes  Reviewed  Mouth  Reviewed  Skin  Reviewed  Nails  Reviewed     Diet Order:   Diet Order            Diet Heart Room service appropriate? Yes; Fluid consistency: Thin  Diet effective now              EDUCATION NEEDS:   Education needs have been addressed  Skin:  Skin Assessment: Reviewed RN Assessment  Last BM:  02/04/2018  Height:   Ht Readings from  Last 1 Encounters:  02/05/18 '5\' 8"'  (1.727 m)    Weight:   Wt Readings from Last 1 Encounters:  02/05/18 52.2 kg    Ideal Body Weight:  63.6 kg  BMI:  Body mass index is 17.5 kg/m.  Estimated Nutritional Needs:   Kcal:  1400-1600  Protein:  70-80 grams  Fluid:  1.4-1.6 L/day  Willey Blade, MS, RD, LDN Office: [phone] Pager: [phone] After Hours/Weekend Pager: [phone]

## 2018-02-05 NOTE — Progress Notes (Signed)
[facility] [place] NAME: Autumn [name]    MR#:  [id number]  DATE OF BIRTH:  09-14-1945  SUBJECTIVE:  CHIEF COMPLAINT:   Chief Complaint  Patient presents with  . Shortness of Breath  Improved overnight, discussion with intensivist-for possible floor transfer later today  REVIEW OF SYSTEMS:  CONSTITUTIONAL: No fever, fatigue or weakness.  EYES: No blurred or double vision.  EARS, NOSE, AND THROAT: No tinnitus or ear pain.  RESPIRATORY: No cough, shortness of breath, wheezing or hemoptysis.  CARDIOVASCULAR: No chest pain, orthopnea, edema.  GASTROINTESTINAL: No nausea, vomiting, diarrhea or abdominal pain.  GENITOURINARY: No dysuria, hematuria.  ENDOCRINE: No polyuria, nocturia,  HEMATOLOGY: No anemia, easy bruising or bleeding SKIN: No rash or lesion. MUSCULOSKELETAL: No joint pain or arthritis.   NEUROLOGIC: No tingling, numbness, weakness.  PSYCHIATRY: No anxiety or depression.   ROS  DRUG ALLERGIES:   Allergies  Allergen Reactions  . Ivp Dye [Iodinated Diagnostic Agents] Hives and Itching    VITALS:  Blood pressure (!) 193/90, pulse (!) 107, temperature 98.9 F (37.2 C), resp. rate (!) 24, height 5\' 8"  (1.727 m), weight 52.2 kg, SpO2 97 %.  PHYSICAL EXAMINATION:  GENERAL:  72 y.o.-year-old patient lying in the bed with no acute distress.  EYES: Pupils equal, round, reactive to light and accommodation. No scleral icterus. Extraocular muscles intact.  HEENT: Head atraumatic, normocephalic. Oropharynx and nasopharynx clear.  NECK:  Supple, no jugular venous distention. No thyroid enlargement, no tenderness.  LUNGS: Normal breath sounds bilaterally, no wheezing, rales,rhonchi or crepitation. No use of accessory muscles of respiration.  CARDIOVASCULAR: S1, S2 normal. No murmurs, rubs, or gallops.  ABDOMEN: Soft, nontender, nondistended. Bowel sounds present. No organomegaly or mass.  EXTREMITIES: No pedal edema, cyanosis, or  clubbing.  NEUROLOGIC: Cranial nerves II through XII are intact. Muscle strength 5/5 in all extremities. Sensation intact. Gait not checked.  PSYCHIATRIC: The patient is alert and oriented x 3.  SKIN: No obvious rash, lesion, or ulcer.   Physical Exam LABORATORY PANEL:   CBC Recent Labs  Lab 02/05/18 0542  WBC 10.6*  HGB 11.9*  HCT 40.2  PLT 284   ------------------------------------------------------------------------------------------------------------------  Chemistries  Recent Labs  Lab 02/05/18 0542  NA 143  K 3.4*  CL 97*  CO2 33*  GLUCOSE 145*  BUN 27*  CREATININE 0.90  CALCIUM 9.3  MG 2.3   ------------------------------------------------------------------------------------------------------------------  Cardiac Enzymes Recent Labs  Lab 02/04/18 1430 02/05/18 0542  TROPONINI <0.03 0.03*   ------------------------------------------------------------------------------------------------------------------  RADIOLOGY:  Dg Chest 2 View  Result Date: 02/04/2018 CLINICAL DATA:  Increasing shortness of breath over the last 2 weeks. No cough, fever or chest pain. EXAM: CHEST - 2 VIEW COMPARISON:  CT, 07/20/2017.  Chest radiograph, 11/19/2015. FINDINGS: Cardiac silhouette is normal in size. No mediastinal or hilar masses. Chronic pleuroparenchymal scarring in the upper lobes, left greater than right. There are associated calcifications in the left upper lobe. Mild scarring at the lung bases. No evidence of pneumonia or pulmonary edema. No pleural effusion or pneumothorax. Skeletal structures are demineralized. There is a scoliosis of the thoracolumbar spine. IMPRESSION: 1. No acute cardiopulmonary disease. 2. Significant areas of chronic lung scarring, greatest in the left upper lobe, stable from the prior studies. Electronically Signed   By: Lajean Manes M.D.   On: 02/04/2018 15:21   Ct Chest Wo Contrast  Result Date: 02/04/2018 CLINICAL DATA:  72 year old female  with shortness of breath. History of sarcoid. EXAM:  CT CHEST WITHOUT CONTRAST TECHNIQUE: Multidetector CT imaging of the chest was performed following the standard protocol without IV contrast. COMPARISON:  Chest radiograph dated 02/04/2018 and CT dated 07/20/2017 FINDINGS: Evaluation of this exam is limited in the absence of intravenous contrast. Cardiovascular: Mild cardiomegaly. No pericardial effusion. There is multi vessel coronary vascular calcification. There is moderate atherosclerotic calcification of the thoracic aorta. There is mild dilatation of the main pulmonary trunk suggestive of pulmonary hypertension. Clinical correlation is recommended. Mediastinum/Nodes: Calcified hilar and mediastinal granuloma. No adenopathy. The esophagus is grossly unremarkable. No mediastinal fluid collection. Lungs/Pleura: Bilateral upper lobe predominant scarring, left apical cystic changes and associated traction bronchiectasis similar to prior CT. Right apical pleural based soft tissue density appears stable and similar to the prior CT. Scattered nodularity primarily in the right lower lobe also similar to prior CT. The constellation of findings are consistent with history of sarcoid. No new consolidative changes. There is no pleural effusion or pneumothorax. Upper Abdomen: Calcified splenic granuloma. Musculoskeletal: Scoliosis. No acute osseous pathology. IMPRESSION: 1. No acute intrathoracic pathology. 2. Pulmonary and mediastinal manifestation of sarcoid similar to prior study. Electronically Signed   By: Anner Crete M.D.   On: 02/04/2018 23:37    ASSESSMENT AND PLAN:  1.  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  No signs of infectious process.  Will place patient on BiPAP, continue oxygen supplement.  We will start steroids IV and duo nebs.  Will rule out cardiac cause.  Continue to  monitor patient on telemetry and follow troponin levels and check 2D echo to further evaluate the cardiac function.  Pulmonary service is consulted for further evaluation and treatment.  2.  Pulmonary sarcoidosis, progressing.  Will change prednisone to IV steroids.  Continue oxygen supplement.  Further recommendations, per pulmonary consultant.  3.  Acute COPD exacerbation.  We will treat with IV steroids, duo nebs, oxygen supplement.  Will place patient on BiPAP and admit her to stepdown for close observation.  4.  Hypertension.  BP is elevated.  Will resume home BP medications and add PRN agents for better control.  5.  Hyperlipidemia, on statin.   All the records are reviewed and case discussed with Care Management/Social Workerr. Management plans discussed with the patient, family and they are in agreement.  CODE STATUS: full  TOTAL TIME TAKING CARE OF THIS PATIENT: 35 minutes.     POSSIBLE D/C IN 2-3 DAYS, DEPENDING ON CLINICAL CONDITION.   Avel Peace Eladia Frame M.D on 02/05/2018   Between 7am to 6pm - Pager - [phone]  After 6pm go to [email or web address] - password [email or web address] [facility]  Office  [phone]  CC: Primary care physician; Baxter Hire, MD  Note: This dictation was prepared with Dragon dictation along with smaller phrase technology. Any transcriptional errors that result from this process are unintentional.

## 2018-02-05 NOTE — Progress Notes (Signed)
*  PRELIMINARY RESULTS* Echocardiogram 2D Echocardiogram has been performed.  Autumn [name] 02/05/2018, 3:51 PM

## 2018-02-05 NOTE — Consult Note (Addendum)
PULMONARY / CRITICAL CARE MEDICINE  Name: Autumn [name] MRN: [id number] DOB: 18-Aug-1945    LOS: 1  Referring Provider: Dr. Duane Boston Reason for Referral: Acute respiratory failure requiring BiPAP Brief patient description: 72 year old female with a history of sarcoidosis and COPD, on home O2 2 L nasal cannula who is being admitted with an acute exacerbation of sarcoidosis and superimposed pulmonary hypertension  HPI: 72 year old female with a medical history as indicated below, chronic home O2 at 2 L nasal cannula for sarcoidosis and superimposed COPD, who presented to the ED with worsening shortness of breath over the last 2 weeks.  She is Dr. Gust Brooms patient and was recently seen and started on a steroid taper and asked to titrate her home O2 up to 3 L.  Today she took multiple bronchodilators and increased oxygen without any relief in her symptoms since she decided to come to the emergency room.  Shortness of breath is associated with a nonproductive cough.  She denies fever chills but reports fatigue.  Her ABG in the ED showed PCO2 of 61, PO2 of 59 and a pH of 7.44.  CBC showed mild leukocytosis of 13K.  CT chest shows sarcoidosis with superimposed pulmonary hypertension.  She is being admitted to the ICU for further management. She does not have a recent 2D echo.  Past Medical History:  Diagnosis Date  . Allergy   . COPD (chronic obstructive pulmonary disease) ([place])   . Glaucoma   . Hypertension   . Osteoporosis   . Ovarian cancer ([place])   . Sarcoid    Past Surgical History:  Procedure Laterality Date  . ABDOMINAL HYSTERECTOMY    . BREAST BIOPSY Right    Prior to Admission medications   Medication Sig Start Date End Date Taking? Authorizing Provider  amLODipine (NORVASC) 5 MG tablet Take 5 mg by mouth daily.   Yes [provider]  clopidogrel (PLAVIX) 75 MG tablet Take 75 mg by mouth daily.   Yes [provider]  donepezil (ARICEPT) 5 MG tablet Take 1 tablet (5  mg total) by mouth at bedtime. 12/05/17 01/14/18 Yes Sowles, Drue Stager, MD  empagliflozin (JARDIANCE) 25 MG TABS tablet Take 25 mg by mouth daily.   Yes [provider]  glycopyrrolate (ROBINUL) 1 MG tablet Take 1 mg by mouth 2 (two) times daily.   Yes [provider]  insulin aspart (NOVOLOG FLEXPEN) 100 UNIT/ML FlexPen Inject 12 Units into the skin 2 (two) times daily.   Yes [provider]  insulin aspart (NOVOLOG) 100 UNIT/ML FlexPen Inject 18 Units into the skin daily. At 1700   Yes [provider]  Insulin Degludec-Liraglutide (XULTOPHY) 100-3.6 UNIT-MG/ML SOPN Inject 50 Units into the skin daily.   Yes [provider]  levETIRAcetam (KEPPRA) 500 MG tablet Take 500 mg by mouth 2 (two) times daily.   Yes [provider]  lipase/protease/amylase (CREON) 12000 units CPEP capsule Take 6,000 Units by mouth 3 (three) times daily before meals.   Yes [provider]  lipase/protease/amylase (CREON) 12000 units CPEP capsule Take 3,000 Units by mouth at bedtime. With snack   Yes [provider]  lisinopril (PRINIVIL,ZESTRIL) 5 MG tablet Take 5 mg by mouth daily.   Yes [provider]  metoprolol succinate (TOPROL-XL) 25 MG 24 hr tablet Take 1 tablet (25 mg total) by mouth daily. 12/05/17  Yes Sowles, Drue Stager, MD  rosuvastatin (CRESTOR) 40 MG tablet Take 1 tablet (40 mg total) by mouth daily. 12/05/17 01/14/18 Yes Sowles, Drue Stager,  MD  aspirin EC 81 MG tablet Take 81 mg by mouth daily.    [provider]  famotidine (PEPCID) 20 MG tablet Take 1 tablet (20 mg total) by mouth 2 (two) times daily. 12/05/17 01/04/18  Steele Sizer, MD  gabapentin (NEURONTIN) 300 MG capsule Take 1 capsule (300 mg total) by mouth 2 (two) times daily. 12/05/17 01/04/18  Steele Sizer, MD  insulin glargine (LANTUS) 100 UNIT/ML injection Inject 0.1 mLs (10 Units total) into the skin daily. 12/05/17 01/04/18  Steele Sizer, MD  lacosamide 100 MG TABS  Take 1 tablet (100 mg total) by mouth 2 (two) times daily. Patient not taking: Reported on 01/14/2018 04/22/17   Fritzi Mandes, MD  promethazine (PHENERGAN) 12.5 MG tablet Take 1 tablet (12.5 mg total) by mouth every 6 (six) hours as needed for nausea or vomiting. Patient not taking: Reported on 01/14/2018 02/08/17   Stark Klein, MD  sertraline (ZOLOFT) 25 MG tablet Take 1 tablet (25 mg total) by mouth daily. Patient not taking: Reported on 01/14/2018 12/05/17   Steele Sizer, MD   Allergies Allergies  Allergen Reactions  . Ivp Dye [Iodinated Diagnostic Agents] Hives and Itching    Family History Family History  Problem Relation Age of Onset  . Breast cancer Mother 55  . Cancer Mother   . Hypertension Father    Social History  reports that she has never smoked. She has never used smokeless tobacco. She reports that she does not drink alcohol or use drugs.  Review Of Systems: Unable to obtain as patient is status on continuous BiPAP  VITAL SIGNS: BP (!) 111/56   Pulse 91   Temp 98.4 F (36.9 C)   Resp 20   Ht 5\' 8"  (1.727 m)   Wt 52.2 kg   SpO2 100%   BMI 17.50 kg/m   HEMODYNAMICS:    VENTILATOR SETTINGS:    INTAKE / OUTPUT: No intake/output data recorded.  PHYSICAL EXAMINATION: General: Appears acutely ill, in moderate respiratory distress HEENT: PERRLA, trachea midline, no JVD Neuro: Alert and oriented x4, no focal deficits Cardiovascular: Apical pulse regular, S1-S2, no murmur regurg or gallop, +2 pulses bilaterally, no edema Lungs: Tachypneic, short expiratory span, bilateral breath sounds, diminished in the bases, no wheezing Abdomen: Nondistended, normal bowel sounds in all 4 quadrants, palpation reveals no organomegaly Musculoskeletal: Positive range of motion in upper and lower extremities, no joint deformities Skin: Warm and dry  LABS:  BMET Recent Labs  Lab 02/04/18 1430  NA 143  K 3.7  CL 98  CO2 35*  BUN 21  CREATININE 0.66  GLUCOSE 90     Electrolytes Recent Labs  Lab 02/04/18 1430  CALCIUM 9.8    CBC Recent Labs  Lab 02/04/18 1430 02/05/18 0542  WBC 13.0* 10.6*  HGB 11.4* 11.9*  HCT 38.8 40.2  PLT 302 284    Coag's No results for input(s): APTT, INR in the last 168 hours.  Sepsis Markers No results for input(s): LATICACIDVEN, PROCALCITON, O2SATVEN in the last 168 hours.  ABG No results for input(s): PHART, PCO2ART, PO2ART in the last 168 hours.  Liver Enzymes No results for input(s): AST, ALT, ALKPHOS, BILITOT, ALBUMIN in the last 168 hours.  Cardiac Enzymes Recent Labs  Lab 02/04/18 1430  TROPONINI <0.03    Glucose Recent Labs  Lab 02/05/18 0042  GLUCAP 230*    Imaging Dg Chest 2 View  Result Date: 02/04/2018 CLINICAL DATA:  Increasing shortness of breath over the last 2 weeks. No cough,  fever or chest pain. EXAM: CHEST - 2 VIEW COMPARISON:  CT, 07/20/2017.  Chest radiograph, 11/19/2015. FINDINGS: Cardiac silhouette is normal in size. No mediastinal or hilar masses. Chronic pleuroparenchymal scarring in the upper lobes, left greater than right. There are associated calcifications in the left upper lobe. Mild scarring at the lung bases. No evidence of pneumonia or pulmonary edema. No pleural effusion or pneumothorax. Skeletal structures are demineralized. There is a scoliosis of the thoracolumbar spine. IMPRESSION: 1. No acute cardiopulmonary disease. 2. Significant areas of chronic lung scarring, greatest in the left upper lobe, stable from the prior studies. Electronically Signed   By: Lajean Manes M.D.   On: 02/04/2018 15:21   Ct Chest Wo Contrast  Result Date: 02/04/2018 CLINICAL DATA:  72 year old female with shortness of breath. History of sarcoid. EXAM: CT CHEST WITHOUT CONTRAST TECHNIQUE: Multidetector CT imaging of the chest was performed following the standard protocol without IV contrast. COMPARISON:  Chest radiograph dated 02/04/2018 and CT dated 07/20/2017 FINDINGS: Evaluation  of this exam is limited in the absence of intravenous contrast. Cardiovascular: Mild cardiomegaly. No pericardial effusion. There is multi vessel coronary vascular calcification. There is moderate atherosclerotic calcification of the thoracic aorta. There is mild dilatation of the main pulmonary trunk suggestive of pulmonary hypertension. Clinical correlation is recommended. Mediastinum/Nodes: Calcified hilar and mediastinal granuloma. No adenopathy. The esophagus is grossly unremarkable. No mediastinal fluid collection. Lungs/Pleura: Bilateral upper lobe predominant scarring, left apical cystic changes and associated traction bronchiectasis similar to prior CT. Right apical pleural based soft tissue density appears stable and similar to the prior CT. Scattered nodularity primarily in the right lower lobe also similar to prior CT. The constellation of findings are consistent with history of sarcoid. No new consolidative changes. There is no pleural effusion or pneumothorax. Upper Abdomen: Calcified splenic granuloma. Musculoskeletal: Scoliosis. No acute osseous pathology. IMPRESSION: 1. No acute intrathoracic pathology. 2. Pulmonary and mediastinal manifestation of sarcoid similar to prior study. Electronically Signed   By: Anner Crete M.D.   On: 02/04/2018 23:37   STUDIES:  2D echo pending  CULTURES: None  ANTIBIOTICS: None  SIGNIFICANT EVENTS: 10/26: Admitted  LINES/TUBES: Peripheral IVs   DISCUSSION: 72 year old female presenting with an acute exacerbation of sarcoidosis and pulmonary hypertension  ASSESSMENT  Acute hypoxic/hypercarbic respiratory failure with underling chronic respiratory failure, pulmonary sarcoidosis and baseline home O2 2 to 3 L/min and on steroid Pulmonary hypertension  Acute COPD exacerbation Atelectasis and possible pneumonia with infective etiology can not be ruled out Hypertension Anemia  PLAN Continues BiPAP and titrate to nasal cannula as  tolerated Nebulized bronchodilators, inhaled  and IV steroids Empiric Rocephin + Zithro, follow with Procalcitonin  2D echo to evaluate pulmonary pressures We will consider referral to pulmonary hypertension clinic IV nitrates and diuretics Optimize antihypertensives and monitor hemodynamics Continue all home medications   Best Practice: Code Status: Full code Diet: Regular diet GI prophylaxis: Not indicated VTE prophylaxis: Subcu heparin  FAMILY  - Updates: Patient and spouse updated on current treatment plan at bedside  Magdalene S. [facility] ANP-BC Pulmonary and Critical Care Medicine [facility] Pager [phone] or [phone]  NB: This document was prepared using Dragon voice recognition software and may include unintentional dictation errors.    02/05/2018, 5:58 AM   I agree with the documented

## 2018-02-05 NOTE — Progress Notes (Signed)
Update- Breathing better on BiPAP. Oxygen saturations 100%. Patient is calmer.

## 2018-02-06 DIAGNOSIS — J9602 Acute respiratory failure with hypercapnia: Secondary | ICD-10-CM

## 2018-02-06 DIAGNOSIS — J9601 Acute respiratory failure with hypoxia: Secondary | ICD-10-CM

## 2018-02-06 DIAGNOSIS — E43 Unspecified severe protein-calorie malnutrition: Secondary | ICD-10-CM

## 2018-02-06 LAB — CBC WITH DIFFERENTIAL/PLATELET
Abs Immature Granulocytes: 0.07 10*3/uL (ref 0.00–0.07)
Basophils Absolute: 0 10*3/uL (ref 0.0–0.1)
Basophils Relative: 0 %
Eosinophils Absolute: 0 10*3/uL (ref 0.0–0.5)
Eosinophils Relative: 0 %
HCT: 41.4 % (ref 36.0–46.0)
Hemoglobin: 12.8 g/dL (ref 12.0–15.0)
Immature Granulocytes: 1 %
Lymphocytes Relative: 2 %
Lymphs Abs: 0.3 10*3/uL — ABNORMAL LOW (ref 0.7–4.0)
MCH: 27.2 pg (ref 26.0–34.0)
MCHC: 30.9 g/dL (ref 30.0–36.0)
MCV: 88.1 fL (ref 80.0–100.0)
Monocytes Absolute: 0.2 10*3/uL (ref 0.1–1.0)
Monocytes Relative: 2 %
Neutro Abs: 12.1 10*3/uL — ABNORMAL HIGH (ref 1.7–7.7)
Neutrophils Relative %: 95 %
Platelets: 306 10*3/uL (ref 150–400)
RBC: 4.7 MIL/uL (ref 3.87–5.11)
RDW: 13.3 % (ref 11.5–15.5)
WBC: 12.7 10*3/uL — ABNORMAL HIGH (ref 4.0–10.5)
nRBC: 0 % (ref 0.0–0.2)

## 2018-02-06 LAB — BASIC METABOLIC PANEL
Anion gap: 11 (ref 5–15)
BUN: 24 mg/dL — ABNORMAL HIGH (ref 8–23)
CO2: 35 mmol/L — ABNORMAL HIGH (ref 22–32)
Calcium: 9.4 mg/dL (ref 8.9–10.3)
Chloride: 100 mmol/L (ref 98–111)
Creatinine, Ser: 0.74 mg/dL (ref 0.44–1.00)
GFR calc Af Amer: >60 mL/min (ref >60)
GFR calc non Af Amer: >60 mL/min (ref >60)
Glucose, Bld: 118 mg/dL — ABNORMAL HIGH (ref 70–99)
Potassium: 4 mmol/L (ref 3.5–5.1)
Sodium: 146 mmol/L — ABNORMAL HIGH (ref 135–145)

## 2018-02-06 LAB — GLUCOSE, CAPILLARY: Glucose-Capillary: 117 mg/dL — ABNORMAL HIGH (ref 70–99)

## 2018-02-06 LAB — BLOOD GAS, ARTERIAL
Acid-Base Excess: 12 mmol/L — ABNORMAL HIGH (ref 0.0–2.0)
Bicarbonate: 38.2 mmol/L — ABNORMAL HIGH (ref 20.0–28.0)
Delivery systems: POSITIVE
Expiratory PAP: 5
FIO2: 0.5
Inspiratory PAP: 14
O2 Saturation: 89.6 %
Patient temperature: 37
RATE: 8 resp/min
pCO2 arterial: 55 mmHg — ABNORMAL HIGH (ref 32.0–48.0)
pH, Arterial: 7.45 (ref 7.350–7.450)
pO2, Arterial: 55 mmHg — ABNORMAL LOW (ref 83.0–108.0)

## 2018-02-06 LAB — INFLUENZA PANEL BY PCR (TYPE A & B)
Influenza A By PCR: NEGATIVE
Influenza B By PCR: NEGATIVE

## 2018-02-06 MED ORDER — BUDESONIDE 0.5 MG/2ML IN SUSP
0.5000 mg | Freq: Two times a day (BID) | RESPIRATORY_TRACT | Status: DC
  Administered 2018-02-07 – 2018-02-15 (×17): 0.5 mg via RESPIRATORY_TRACT
  Filled 2018-02-06 (×17): qty 2

## 2018-02-06 MED ORDER — HYDRALAZINE HCL 20 MG/ML IJ SOLN
10.0000 mg | INTRAMUSCULAR | Status: DC | PRN
  Administered 2018-02-06 – 2018-02-12 (×7): 10 mg via INTRAVENOUS
  Filled 2018-02-06 (×7): qty 1

## 2018-02-06 MED ORDER — SODIUM CHLORIDE 0.9 % IV SOLN
500.0000 mg | INTRAVENOUS | Status: AC
  Administered 2018-02-07: 500 mg via INTRAVENOUS
  Filled 2018-02-06: qty 500

## 2018-02-06 MED ORDER — CLONIDINE HCL 0.1 MG PO TABS
0.2000 mg | ORAL_TABLET | Freq: Once | ORAL | Status: AC
  Administered 2018-02-06: 0.2 mg via ORAL
  Filled 2018-02-06: qty 2

## 2018-02-06 MED ORDER — MONTELUKAST SODIUM 10 MG PO TABS
10.0000 mg | ORAL_TABLET | Freq: Every day | ORAL | Status: DC
  Administered 2018-02-06 – 2018-02-14 (×9): 10 mg via ORAL
  Filled 2018-02-06 (×9): qty 1

## 2018-02-06 MED ORDER — SODIUM CHLORIDE 0.9 % IV SOLN
1.0000 g | INTRAVENOUS | Status: AC
  Administered 2018-02-07: 1 g via INTRAVENOUS
  Filled 2018-02-06: qty 10
  Filled 2018-02-06: qty 1
  Filled 2018-02-06: qty 10

## 2018-02-06 NOTE — Progress Notes (Signed)
Off bipap per patient's request.  Non labored breathing therefore gave PO meds and then patient soon started to complain of shortness of breath with o2 sats down to 83% on 3L nasal cannula.  Patient asked to be placed back on bipap.  Bipap resumed, husband at bedside.

## 2018-02-06 NOTE — Progress Notes (Signed)
Name: Autumn [name] MRN: [id number] DOB: 1945/04/24     CONSULTATION DATE: 02/04/2018  Subjective & objective: Does not tolerate being off BiPAP because of desaturation.  PAST MEDICAL HISTORY :   has a past medical history of Allergy, COPD (chronic obstructive pulmonary disease) ([place]), Glaucoma, Hypertension, Osteoporosis, Ovarian cancer ([place]), and Sarcoid.  has a past surgical history that includes Breast biopsy (Right) and Abdominal hysterectomy. Prior to Admission medications   Medication Sig Start Date End Date Taking? Authorizing Provider  acetaminophen (TYLENOL) 500 MG tablet Take 500 mg by mouth every 4 (four) hours as needed for mild pain or moderate pain.   Yes [provider]  albuterol (PROVENTIL) (2.5 MG/3ML) 0.083% nebulizer solution Take 2.5 mg by nebulization every 6 (six) hours as needed for wheezing or shortness of breath.    Yes [provider]  atenolol (TENORMIN) 50 MG tablet Take 50 mg by mouth daily.    Yes [provider]  atorvastatin (LIPITOR) 10 MG tablet Take 1 tablet by mouth daily.    Yes [provider]  Buprenorphine HCl (BELBUCA) 150 MCG FILM Place 150 mcg inside cheek every 12 (twelve) hours. 12/27/17  Yes Gillis Santa, MD  CALCIUM 600+D3 600-400 MG-UNIT TABS Take 1 tablet by mouth 2 (two) times daily with a meal.    Yes [provider]  denosumab (PROLIA) 60 MG/ML SOLN injection Inject 60 mg into the skin as directed.  04/27/17  Yes [provider]  fluticasone (FLONASE) 50 MCG/ACT nasal spray Place 2 sprays into both nostrils daily.   Yes [provider]  Fluticasone-Salmeterol (ADVAIR DISKUS) 250-50 MCG/DOSE AEPB Inhale 1 puff into the lungs 2 (two) times daily.    Yes [provider]  lidocaine (XYLOCAINE) 5 % ointment Apply 1 application topically 4 (four) times daily as needed for moderate pain. 11/29/17 02/27/18 Yes Gillis Santa, MD  lidocaine (XYLOCAINE) 5 % ointment Apply 1  application topically 4 (four) times daily as needed for mild pain.   Yes [provider]  montelukast (SINGULAIR) 10 MG tablet Take 10 mg by mouth at bedtime.   Yes [provider]  Omega-3 Fatty Acids (FISH OIL) 1000 MG CAPS Take 1,000 mg by mouth daily.    Yes [provider]  predniSONE (DELTASONE) 10 MG tablet Take 10 mg by mouth daily.  06/20/17  Yes [provider]  pregabalin (LYRICA) 100 MG capsule Take 1 capsule (100 mg total) by mouth 3 (three) times daily. 11/22/17  Yes Gillis Santa, MD  Propylene Glycol (SYSTANE BALANCE) 0.6 % SOLN Place 1 drop into both eyes daily as needed (dryness).   Yes [provider]  sertraline (ZOLOFT) 25 MG tablet Take 25 mg by mouth daily. 02/16/17  Yes [provider]  tiZANidine (ZANAFLEX) 4 MG tablet Take 1 tablet (4 mg total) by mouth 2 (two) times daily as needed for muscle spasms. Patient not taking: Reported on 02/06/2018 10/20/17 10/20/18  Gillis Santa, MD   Allergies  Allergen Reactions  . Ivp Dye [Iodinated Diagnostic Agents] Hives and Itching    FAMILY HISTORY:  family history includes Breast cancer (age of onset: 41) in her mother; Cancer in her mother; Hypertension in her father. SOCIAL HISTORY:  reports that she has never smoked. She has never used smokeless tobacco. She reports that she does not drink alcohol or use drugs.  REVIEW OF SYSTEMS:   Unable to obtain due to critical illness   VITAL SIGNS: Temp:  [98.2 F (36.8  C)-98.9 F (37.2 C)] 98.7 F (37.1 C) (10/28 1150) Pulse Rate:  [73-115] 88 (10/28 1430) Resp:  [17-39] 25 (10/28 1430) BP: (98-197)/(57-104) 197/88 (10/28 1438) SpO2:  [79 %-100 %] 98 % (10/28 1430) FiO2 (%):  [40 %-100 %] 60 % (10/28 1430)   Physical examination: Somnolent arousable and no focal neurological deficits On a BiPAP 12/5 40%, no distress, bilateral equal air entry with no adventitious sounds S1 sinus rhythm no murmur Benign abdominal exam  with normal peristalsis No leg edema   Assessment and plan:   *Acute on chronic respiratory failure with baseline home O2 3 L nasal cannula.  Continues to require BiPAP because of desaturation once off PPV. *Pulmonary sarcoidosis steroid-dependent *Pulmonary hypertension.  *Acute exacerbation of COPD -Monitor ABG, work of breathing, optimize BiPAP settings. -Optimize bronchodilators, inhaled steroids, systemic steroids, empiric antibiotics and diuresis to improve lung compliance.  Atelectasis and pneumonia. -Empiric Rocephin.  Monitor CXR + CBC + FiO2 requirement -Consider de-escalation of antimicrobial if markers for infective etiology remains negative.  Hypertension.  Echocardiogram 02/05/2018 LVEF 10% grade 1 diastolic dysfunction and diffuse hypokinesis -Optimize antihypertensives and monitor hemodynamics  Prerenal azotemia and hypernatremia with intravascular volume depletion -Optimize hydration, monitor renal panel and urine output  Anemia -Keep hemoglobin more than 7 g/dL  Full code  DVT & GI prophylaxis.  Continue with supportive care  Critical care time 35 minutes

## 2018-02-06 NOTE — Progress Notes (Signed)
PT Cancellation Note  Patient Details Name: Autumn [name] MRN: [id number] DOB: 1945-07-07   Cancelled Treatment:    Reason Eval/Treat Not Completed: Medical issues which prohibited therapy(Consult received and chart reviewed.  Patient recently returned to BiPAP due to worsening SOB, hypoxia; unable to tolerate evaluation at this time.  Will hold this date and re-attempt next date as patient medically appropriate and available.)   Whitfield Dulay H. Owens Shark, PT, DPT, NCS 02/06/18, 11:34 AM [phone]

## 2018-02-06 NOTE — Progress Notes (Signed)
[facility] [place] NAME: Autumn [name]    MR#:  [id number]  DATE OF BIRTH:  03-26-1946  SUBJECTIVE:  CHIEF COMPLAINT:   Chief Complaint  Patient presents with  . Shortness of Breath  And discussion with intensivist, encephalopathy due to hypertension, continue tenuous respiratory status due to sarcoid disease  REVIEW OF SYSTEMS:  CONSTITUTIONAL: No fever, fatigue or weakness.  EYES: No blurred or double vision.  EARS, NOSE, AND THROAT: No tinnitus or ear pain.  RESPIRATORY: No cough, shortness of breath, wheezing or hemoptysis.  CARDIOVASCULAR: No chest pain, orthopnea, edema.  GASTROINTESTINAL: No nausea, vomiting, diarrhea or abdominal pain.  GENITOURINARY: No dysuria, hematuria.  ENDOCRINE: No polyuria, nocturia,  HEMATOLOGY: No anemia, easy bruising or bleeding SKIN: No rash or lesion. MUSCULOSKELETAL: No joint pain or arthritis.   NEUROLOGIC: No tingling, numbness, weakness.  PSYCHIATRY: No anxiety or depression.   ROS  DRUG ALLERGIES:   Allergies  Allergen Reactions  . Ivp Dye [Iodinated Diagnostic Agents] Hives and Itching    VITALS:  Blood pressure (!) 176/104, pulse (!) 107, temperature 98.7 F (37.1 C), temperature source Oral, resp. rate (!) 28, height 5\' 8"  (1.727 m), weight 52.2 kg, SpO2 100 %.  PHYSICAL EXAMINATION:  GENERAL:  72 y.o.-year-old patient lying in the bed with no acute distress.  EYES: Pupils equal, round, reactive to light and accommodation. No scleral icterus. Extraocular muscles intact.  HEENT: Head atraumatic, normocephalic. Oropharynx and nasopharynx clear.  NECK:  Supple, no jugular venous distention. No thyroid enlargement, no tenderness.  LUNGS: Normal breath sounds bilaterally, no wheezing, rales,rhonchi or crepitation. No use of accessory muscles of respiration.  CARDIOVASCULAR: S1, S2 normal. No murmurs, rubs, or gallops.  ABDOMEN: Soft, nontender, nondistended. Bowel sounds present. No  organomegaly or mass.  EXTREMITIES: No pedal edema, cyanosis, or clubbing.  NEUROLOGIC: Cranial nerves II through XII are intact. Muscle strength 5/5 in all extremities. Sensation intact. Gait not checked.  PSYCHIATRIC: The patient is alert and oriented x 3.  SKIN: No obvious rash, lesion, or ulcer.   Physical Exam LABORATORY PANEL:   CBC Recent Labs  Lab 02/06/18 0400  WBC 12.7*  HGB 12.8  HCT 41.4  PLT 306   ------------------------------------------------------------------------------------------------------------------  Chemistries  Recent Labs  Lab 02/05/18 0542 02/06/18 0400  NA 143 146*  K 3.4* 4.0  CL 97* 100  CO2 33* 35*  GLUCOSE 145* 118*  BUN 27* 24*  CREATININE 0.90 0.74  CALCIUM 9.3 9.4  MG 2.3  --    ------------------------------------------------------------------------------------------------------------------  Cardiac Enzymes Recent Labs  Lab 02/04/18 1430 02/05/18 0542  TROPONINI <0.03 0.03*   ------------------------------------------------------------------------------------------------------------------  RADIOLOGY:  Dg Chest 2 View  Result Date: 02/04/2018 CLINICAL DATA:  Increasing shortness of breath over the last 2 weeks. No cough, fever or chest pain. EXAM: CHEST - 2 VIEW COMPARISON:  CT, 07/20/2017.  Chest radiograph, 11/19/2015. FINDINGS: Cardiac silhouette is normal in size. No mediastinal or hilar masses. Chronic pleuroparenchymal scarring in the upper lobes, left greater than right. There are associated calcifications in the left upper lobe. Mild scarring at the lung bases. No evidence of pneumonia or pulmonary edema. No pleural effusion or pneumothorax. Skeletal structures are demineralized. There is a scoliosis of the thoracolumbar spine. IMPRESSION: 1. No acute cardiopulmonary disease. 2. Significant areas of chronic lung scarring, greatest in the left upper lobe, stable from the prior studies. Electronically Signed   By: Lajean Manes M.D.   On: 02/04/2018 15:21  Ct Chest Wo Contrast  Result Date: 02/04/2018 CLINICAL DATA:  72 year old female with shortness of breath. History of sarcoid. EXAM: CT CHEST WITHOUT CONTRAST TECHNIQUE: Multidetector CT imaging of the chest was performed following the standard protocol without IV contrast. COMPARISON:  Chest radiograph dated 02/04/2018 and CT dated 07/20/2017 FINDINGS: Evaluation of this exam is limited in the absence of intravenous contrast. Cardiovascular: Mild cardiomegaly. No pericardial effusion. There is multi vessel coronary vascular calcification. There is moderate atherosclerotic calcification of the thoracic aorta. There is mild dilatation of the main pulmonary trunk suggestive of pulmonary hypertension. Clinical correlation is recommended. Mediastinum/Nodes: Calcified hilar and mediastinal granuloma. No adenopathy. The esophagus is grossly unremarkable. No mediastinal fluid collection. Lungs/Pleura: Bilateral upper lobe predominant scarring, left apical cystic changes and associated traction bronchiectasis similar to prior CT. Right apical pleural based soft tissue density appears stable and similar to the prior CT. Scattered nodularity primarily in the right lower lobe also similar to prior CT. The constellation of findings are consistent with history of sarcoid. No new consolidative changes. There is no pleural effusion or pneumothorax. Upper Abdomen: Calcified splenic granuloma. Musculoskeletal: Scoliosis. No acute osseous pathology. IMPRESSION: 1. No acute intrathoracic pathology. 2. Pulmonary and mediastinal manifestation of sarcoid similar to prior study. Electronically Signed   By: Anner Crete M.D.   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 Management plans discussed with the patient, family and they are in agreement.  CODE STATUS: full  TOTAL TIME TAKING CARE OF THIS PATIENT: 35 minutes.   POSSIBLE D/C IN 2-3 DAYS, DEPENDING ON CLINICAL CONDITION.   Avel Peace Kamryn Gauthier M.D on 02/06/2018   Between 7am to 6pm - Pager - [phone]  After 6pm go to [email or web address] - password [email or web address] [facility]  Office  [phone]  CC: Primary care physician; Baxter Hire, MD  Note: This dictation was prepared with Dragon dictation along with smaller phrase technology. Any transcriptional errors that result from this process are unintentional.

## 2018-02-06 NOTE — Progress Notes (Signed)
RN spoke with Dr. Soyla Murphy and made MD aware that patient's blood pressure is remaining elevated even after IV hydralazine was given around 1440 and IV metoprolol was given around 1620.  MD gave order for one time dose of PO clonidine 0.2 mg, RN also asked MD to speak with patient's husband in room who is asking to see the doctor.  MD acknowledged.

## 2018-02-06 NOTE — Progress Notes (Signed)
Pt calling out for breathing treatment, O2 sats down to 76% on 3 L [place]. Patient placed back on bipap and RT notified.

## 2018-02-07 ENCOUNTER — Inpatient Hospital Stay: Payer: Medicare Other

## 2018-02-07 DIAGNOSIS — J9621 Acute and chronic respiratory failure with hypoxia: Secondary | ICD-10-CM

## 2018-02-07 LAB — MAGNESIUM: Magnesium: 2.7 mg/dL — ABNORMAL HIGH (ref 1.7–2.4)

## 2018-02-07 LAB — CBC WITH DIFFERENTIAL/PLATELET
Abs Immature Granulocytes: 0.08 10*3/uL — ABNORMAL HIGH (ref 0.00–0.07)
Basophils Absolute: 0 10*3/uL (ref 0.0–0.1)
Basophils Relative: 0 %
Eosinophils Absolute: 0 10*3/uL (ref 0.0–0.5)
Eosinophils Relative: 0 %
HCT: 40.5 % (ref 36.0–46.0)
Hemoglobin: 11.9 g/dL — ABNORMAL LOW (ref 12.0–15.0)
Immature Granulocytes: 1 %
Lymphocytes Relative: 3 %
Lymphs Abs: 0.4 10*3/uL — ABNORMAL LOW (ref 0.7–4.0)
MCH: 26.2 pg (ref 26.0–34.0)
MCHC: 29.4 g/dL — ABNORMAL LOW (ref 30.0–36.0)
MCV: 89.2 fL (ref 80.0–100.0)
Monocytes Absolute: 0.6 10*3/uL (ref 0.1–1.0)
Monocytes Relative: 5 %
Neutro Abs: 12.3 10*3/uL — ABNORMAL HIGH (ref 1.7–7.7)
Neutrophils Relative %: 91 %
Platelets: 264 10*3/uL (ref 150–400)
RBC: 4.54 MIL/uL (ref 3.87–5.11)
RDW: 13.7 % (ref 11.5–15.5)
WBC: 13.3 10*3/uL — ABNORMAL HIGH (ref 4.0–10.5)
nRBC: 0 % (ref 0.0–0.2)

## 2018-02-07 LAB — BASIC METABOLIC PANEL
Anion gap: 9 (ref 5–15)
BUN: 41 mg/dL — ABNORMAL HIGH (ref 8–23)
CO2: 35 mmol/L — ABNORMAL HIGH (ref 22–32)
Calcium: 9.2 mg/dL (ref 8.9–10.3)
Chloride: 101 mmol/L (ref 98–111)
Creatinine, Ser: 0.73 mg/dL (ref 0.44–1.00)
GFR calc Af Amer: >60 mL/min (ref >60)
GFR calc non Af Amer: >60 mL/min (ref >60)
Glucose, Bld: 126 mg/dL — ABNORMAL HIGH (ref 70–99)
Potassium: 3.9 mmol/L (ref 3.5–5.1)
Sodium: 145 mmol/L (ref 135–145)

## 2018-02-07 LAB — GLUCOSE, CAPILLARY
Glucose-Capillary: 108 mg/dL — ABNORMAL HIGH (ref 70–99)
Glucose-Capillary: 111 mg/dL — ABNORMAL HIGH (ref 70–99)
Glucose-Capillary: 104 mg/dL — ABNORMAL HIGH (ref 70–99)
Glucose-Capillary: 132 mg/dL — ABNORMAL HIGH (ref 70–99)
Glucose-Capillary: 117 mg/dL — ABNORMAL HIGH (ref 70–99)
Glucose-Capillary: 127 mg/dL — ABNORMAL HIGH (ref 70–99)

## 2018-02-07 LAB — PHOSPHORUS: Phosphorus: 3.2 mg/dL (ref 2.5–4.6)

## 2018-02-07 MED ORDER — TIZANIDINE HCL 4 MG PO TABS
4.0000 mg | ORAL_TABLET | Freq: Two times a day (BID) | ORAL | Status: DC | PRN
  Filled 2018-02-07: qty 1

## 2018-02-07 MED ORDER — FAMOTIDINE 20 MG PO TABS
20.0000 mg | ORAL_TABLET | Freq: Every day | ORAL | Status: DC
  Administered 2018-02-07 – 2018-02-14 (×8): 20 mg via ORAL
  Filled 2018-02-07 (×8): qty 1

## 2018-02-07 MED ORDER — ATENOLOL 50 MG PO TABS
50.0000 mg | ORAL_TABLET | Freq: Two times a day (BID) | ORAL | Status: AC
  Administered 2018-02-07 – 2018-02-09 (×5): 50 mg via ORAL
  Filled 2018-02-07: qty 1
  Filled 2018-02-07 (×2): qty 2
  Filled 2018-02-07 (×2): qty 1

## 2018-02-07 MED ORDER — ATENOLOL 25 MG PO TABS
25.0000 mg | ORAL_TABLET | ORAL | Status: AC
  Administered 2018-02-07: 25 mg via ORAL
  Filled 2018-02-07: qty 1

## 2018-02-07 MED ORDER — LABETALOL HCL 5 MG/ML IV SOLN
10.0000 mg | Freq: Once | INTRAVENOUS | Status: AC
  Administered 2018-02-08: 10 mg via INTRAVENOUS
  Filled 2018-02-07: qty 4

## 2018-02-07 MED ORDER — ALPRAZOLAM 0.25 MG PO TABS
0.2500 mg | ORAL_TABLET | Freq: Three times a day (TID) | ORAL | Status: DC | PRN
  Administered 2018-02-07 – 2018-02-12 (×4): 0.25 mg via ORAL
  Filled 2018-02-07 (×4): qty 1

## 2018-02-07 MED ORDER — SODIUM CHLORIDE 0.45 % IV SOLN
INTRAVENOUS | Status: DC
  Administered 2018-02-07 – 2018-02-09 (×3): via INTRAVENOUS

## 2018-02-07 NOTE — Progress Notes (Signed)
[facility] [place] NAME: Autumn [name]    MR#:  [id number]  DATE OF BIRTH:  02-12-1946  SUBJECTIVE:  CHIEF COMPLAINT:   Chief Complaint  Patient presents with  . Shortness of Breath  Continues to require BiPAP,  in discussion with intensivist-we will continue to need aggressive pulmonary toilet/BiPAP  REVIEW OF SYSTEMS:  CONSTITUTIONAL: No fever, fatigue or weakness.  EYES: No blurred or double vision.  EARS, NOSE, AND THROAT: No tinnitus or ear pain.  RESPIRATORY: No cough, shortness of breath, wheezing or hemoptysis.  CARDIOVASCULAR: No chest pain, orthopnea, edema.  GASTROINTESTINAL: No nausea, vomiting, diarrhea or abdominal pain.  GENITOURINARY: No dysuria, hematuria.  ENDOCRINE: No polyuria, nocturia,  HEMATOLOGY: No anemia, easy bruising or bleeding SKIN: No rash or lesion. MUSCULOSKELETAL: No joint pain or arthritis.   NEUROLOGIC: No tingling, numbness, weakness.  PSYCHIATRY: No anxiety or depression.   ROS  DRUG ALLERGIES:   Allergies  Allergen Reactions  . Ivp Dye [Iodinated Diagnostic Agents] Hives and Itching    VITALS:  Blood pressure (!) 150/73, pulse 87, temperature 98.6 F (37 C), temperature source Axillary, resp. rate (!) 25, height 5\' 8"  (1.727 m), weight 54.6 kg, SpO2 97 %.  PHYSICAL EXAMINATION:  GENERAL:  72 y.o.-year-old patient lying in the bed with no acute distress.  EYES: Pupils equal, round, reactive to light and accommodation. No scleral icterus. Extraocular muscles intact.  HEENT: Head atraumatic, normocephalic. Oropharynx and nasopharynx clear.  NECK:  Supple, no jugular venous distention. No thyroid enlargement, no tenderness.  LUNGS: Normal breath sounds bilaterally, no wheezing, rales,rhonchi or crepitation. No use of accessory muscles of respiration.  CARDIOVASCULAR: S1, S2 normal. No murmurs, rubs, or gallops.  ABDOMEN: Soft, nontender, nondistended. Bowel sounds present. No organomegaly or  mass.  EXTREMITIES: No pedal edema, cyanosis, or clubbing.  NEUROLOGIC: Cranial nerves II through XII are intact. Muscle strength 5/5 in all extremities. Sensation intact. Gait not checked.  PSYCHIATRIC: The patient is alert and oriented x 3.  SKIN: No obvious rash, lesion, or ulcer.   Physical Exam LABORATORY PANEL:   CBC Recent Labs  Lab 02/07/18 0324  WBC 13.3*  HGB 11.9*  HCT 40.5  PLT 264   ------------------------------------------------------------------------------------------------------------------  Chemistries  Recent Labs  Lab 02/07/18 0324  NA 145  K 3.9  CL 101  CO2 35*  GLUCOSE 126*  BUN 41*  CREATININE 0.73  CALCIUM 9.2  MG 2.7*   ------------------------------------------------------------------------------------------------------------------  Cardiac Enzymes Recent Labs  Lab 02/04/18 1430 02/05/18 0542  TROPONINI <0.03 0.03*   ------------------------------------------------------------------------------------------------------------------  RADIOLOGY:  Dg Chest Port 1 View  Result Date: 02/07/2018 CLINICAL DATA:  Respiratory failure.  History of sarcoidosis. EXAM: PORTABLE CHEST 1 VIEW COMPARISON:  Chest radiograph and chest CT February 04, 2018 FINDINGS: There is scarring in the left upper lobe with asymmetric apical pleural thickening, stable. There are calcifications in this area as well. There is a moderate degree of scarring in the right upper lobe. There is mild bibasilar scarring. There is no edema or consolidation. The cardiac silhouette is normal. There are calcified hilar and mediastinal lymph nodes. No adenopathy by size criteria evident. No bone lesions. Scoliosis stable. IMPRESSION: Stable upper lobe scarring, more severe on the left than on the right with asymmetric left apical pleural thickening, stable. Areas of calcification in the left upper lobe stable. Milder scarring right base. Calcified mediastinal and hilar lymph nodes. These  are changes consistent with residua from prior sarcoidosis. No edema  or consolidation. Stable cardiac silhouette. Stable scoliosis. Electronically Signed   By: Lowella Grip III M.D.   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 Management plans discussed with the patient, family and they are in agreement.  CODE STATUS: full  TOTAL TIME TAKING CARE OF THIS PATIENT: 35 minutes.   POSSIBLE D/C IN 2-3 DAYS, DEPENDING ON CLINICAL CONDITION.   Autumn [name] M.D on 02/07/2018   Between 7am to 6pm - Pager - [phone]  After 6pm go to [email or web address] - password [email or web address] [facility]  Office  [phone]  CC: Primary care physician; Autumn Hire, MD  Note: This dictation was prepared with Dragon dictation along with smaller phrase technology. Any transcriptional errors that result from this process are unintentional.

## 2018-02-07 NOTE — Consult Note (Signed)
ANTICOAGULATION CONSULT NOTE  Pharmacy Antibiotic Note  Autumn [name] is a 72 y.o. female admitted on 02/04/2018 with pneumonia. Patient's pertinent history includes pulmonary sarcoidosis, COPD, chronic respiratory failure on continuous 2 L oxygen per nasal cannula at home. Patient comes from home and is empirically treated for CAP. Pharmacy has been consulted for azithromycin and ceftiraxone dosing.   Plan: Patient ordered to finish azithromycin IV 500 mg q24h today for a total of 3 day course.   Continue ceftriaxone IV 1 g q24h today. Follow up on the morning rounds tomorrow, may be de-escalated if no infectious iteology present.    Height: 5\' 8"  (172.7 cm) Weight: 120 lb 5.9 oz (54.6 kg) IBW/kg (Calculated) : 63.9  Temp (24hrs), Avg:98.4 F (36.9 C), Min:97.8 F (36.6 C), Max:98.9 F (37.2 C)  Recent Labs  Lab 02/04/18 1430 02/05/18 0542 02/06/18 0400 02/07/18 0324  WBC 13.0* 10.6* 12.7* 13.3*  CREATININE 0.66 0.90 0.74 0.73    Estimated Creatinine Clearance: 53.2 mL/min (by C-G formula based on SCr of 0.73 mg/dL).    Allergies  Allergen Reactions  . Ivp Dye [Iodinated Diagnostic Agents] Hives and Itching    Antimicrobials this admission: Azithromycin 10/27 >> 10/29  Ceftriaxone 10/27 >>   Dose adjustments this admission:   Microbiology results: 10/27 MRSA PCR: negative   Thank you for allowing pharmacy to be a part of this patient's care.  Ocie Doyne, PharmD candidate  02/07/2018 11:29 AM

## 2018-02-07 NOTE — Progress Notes (Signed)
PT Cancellation Note  Patient Details Name: Autumn [name] MRN: [id number] DOB: 07-Jan-1946   Cancelled Treatment:    Reason Eval/Treat Not Completed: Medical issues which prohibited therapy(Chart reviewed for re-attempt at evaluation.  Patient remains on BiPAP, unable to participate with evaluation.  Will re-attempt next date.)   Dempsey Knotek H. Owens Shark, PT, DPT, NCS 02/07/18, 2:53 PM [phone]

## 2018-02-07 NOTE — Progress Notes (Addendum)
Name: Autumn [name] MRN: [id number] DOB: Apr 06, 1946     CONSULTATION DATE: 02/04/2018 Subjective & objective: Continue to require BiPAP 35% 12/5 and moderately controlled hypertension   PAST MEDICAL HISTORY :   has a past medical history of Allergy, COPD (chronic obstructive pulmonary disease) ([place]), Glaucoma, Hypertension, Osteoporosis, Ovarian cancer ([place]), and Sarcoid.  has a past surgical history that includes Breast biopsy (Right) and Abdominal hysterectomy. Prior to Admission medications   Medication Sig Start Date End Date Taking? Authorizing Provider  acetaminophen (TYLENOL) 500 MG tablet Take 500 mg by mouth every 4 (four) hours as needed for mild pain or moderate pain.   Yes [provider]  albuterol (PROVENTIL) (2.5 MG/3ML) 0.083% nebulizer solution Take 2.5 mg by nebulization every 6 (six) hours as needed for wheezing or shortness of breath.    Yes [provider]  atenolol (TENORMIN) 50 MG tablet Take 50 mg by mouth daily.    Yes [provider]  atorvastatin (LIPITOR) 10 MG tablet Take 1 tablet by mouth daily.    Yes [provider]  Buprenorphine HCl (BELBUCA) 150 MCG FILM Place 150 mcg inside cheek every 12 (twelve) hours. 12/27/17  Yes Gillis Santa, MD  CALCIUM 600+D3 600-400 MG-UNIT TABS Take 1 tablet by mouth 2 (two) times daily with a meal.    Yes [provider]  denosumab (PROLIA) 60 MG/ML SOLN injection Inject 60 mg into the skin as directed.  04/27/17  Yes [provider]  fluticasone (FLONASE) 50 MCG/ACT nasal spray Place 2 sprays into both nostrils daily.   Yes [provider]  Fluticasone-Salmeterol (ADVAIR DISKUS) 250-50 MCG/DOSE AEPB Inhale 1 puff into the lungs 2 (two) times daily.    Yes [provider]  lidocaine (XYLOCAINE) 5 % ointment Apply 1 application topically 4 (four) times daily as needed for moderate pain. 11/29/17 02/27/18 Yes Gillis Santa, MD  lidocaine (XYLOCAINE) 5 %  ointment Apply 1 application topically 4 (four) times daily as needed for mild pain.   Yes [provider]  montelukast (SINGULAIR) 10 MG tablet Take 10 mg by mouth at bedtime.   Yes [provider]  Omega-3 Fatty Acids (FISH OIL) 1000 MG CAPS Take 1,000 mg by mouth daily.    Yes [provider]  predniSONE (DELTASONE) 10 MG tablet Take 10 mg by mouth daily.  06/20/17  Yes [provider]  pregabalin (LYRICA) 100 MG capsule Take 1 capsule (100 mg total) by mouth 3 (three) times daily. 11/22/17  Yes Gillis Santa, MD  Propylene Glycol (SYSTANE BALANCE) 0.6 % SOLN Place 1 drop into both eyes daily as needed (dryness).   Yes [provider]  sertraline (ZOLOFT) 25 MG tablet Take 25 mg by mouth daily. 02/16/17  Yes [provider]  tiZANidine (ZANAFLEX) 4 MG tablet Take 1 tablet (4 mg total) by mouth 2 (two) times daily as needed for muscle spasms. Patient not taking: Reported on 02/06/2018 10/20/17 10/20/18  Gillis Santa, MD   Allergies  Allergen Reactions  . Ivp Dye [Iodinated Diagnostic Agents] Hives and Itching    FAMILY HISTORY:  family history includes Breast cancer (age of onset: 32) in her mother; Cancer in her mother; Hypertension in her father. SOCIAL HISTORY:  reports that she has never smoked. She has never used smokeless tobacco. She reports that she does not drink alcohol or use drugs.  REVIEW OF SYSTEMS:   Unable to obtain due to critical illness   VITAL SIGNS: Temp:  [97.8 F (  36.6 C)-98.9 F (37.2 C)] 98.6 F (37 C) (10/29 0738) Pulse Rate:  [78-115] 82 (10/29 0800) Resp:  [18-31] 20 (10/29 0800) BP: (91-221)/(60-145) 221/107 (10/29 0911) SpO2:  [79 %-100 %] 96 % (10/29 0800) FiO2 (%):  [35 %-80 %] 35 % (10/29 0745) Weight:  [54.6 kg] 54.6 kg (10/29 0500)  Physical examination: Awake and oriented and no focal neurological deficits On a BiPAP 12/5 35%, no distress, bilateral equal air entry with no adventitious  sounds S1 sinus rhythm no murmur Benign abdominal exam with normal peristalsis No leg edema   Assessment and plan:   *Acute on chronic respiratory failure with baseline home O2 3 L nasal cannula.  Continues to require BiPAP because of desaturation once off PPV. *Pulmonary sarcoidosis steroid-dependent *Pulmonary hypertension.  *Acute exacerbation of COPD -Monitor ABG, work of breathing, optimize BiPAP settings. -Optimize bronchodilators, inhaled steroids, systemic steroids, empiric antibiotics and diuresis to improve lung compliance.  Anxiety -Optimize Xanax  Atelectasis and pneumonia. -Empiric Rocephin.  Monitor CXR + CBC + FiO2 requirement -Consider de-escalation of antimicrobial if markers for infective etiology remains negative.  Hypertension.  Echocardiogram 02/05/2018 LVEF 77% grade 1 diastolic dysfunction and diffuse hypokinesis -Optimize antihypertensives and monitor hemodynamics  Prerenal azotemia and hypernatremia with intravascular volume depletion -Optimize hydration, monitor renal panel and urine output  Anemia -Keep hemoglobin more than 7 g/dL  Full code  DVT & GI prophylaxis.  Continue with supportive care Autumn [name] was updated at the bedside and he agreed with the plan of care Critical care time 35 minutes

## 2018-02-07 NOTE — Progress Notes (Signed)
Dr. Soyla Murphy present and gave order for xanax 0.25 mg PRN q8H.  25 mg of atenolol ordered to equal new dose of 50 mg since previous order for 25 mg has already been given for blood pressure this morning.

## 2018-02-08 ENCOUNTER — Other Ambulatory Visit: Payer: Self-pay

## 2018-02-08 LAB — BLOOD GAS, ARTERIAL
Acid-Base Excess: 13.8 mmol/L — ABNORMAL HIGH (ref 0.0–2.0)
Bicarbonate: 39.8 mmol/L — ABNORMAL HIGH (ref 20.0–28.0)
FIO2: 0.6
O2 Saturation: 99.9 %
Patient temperature: 37
pCO2 arterial: 56 mmHg — ABNORMAL HIGH (ref 32.0–48.0)
pH, Arterial: 7.46 — ABNORMAL HIGH (ref 7.350–7.450)
pO2, Arterial: 280 mmHg — ABNORMAL HIGH (ref 83.0–108.0)

## 2018-02-08 LAB — CBC
HCT: 38.5 % (ref 36.0–46.0)
Hemoglobin: 11.2 g/dL — ABNORMAL LOW (ref 12.0–15.0)
MCH: 26.4 pg (ref 26.0–34.0)
MCHC: 29.1 g/dL — ABNORMAL LOW (ref 30.0–36.0)
MCV: 90.6 fL (ref 80.0–100.0)
Platelets: 236 10*3/uL (ref 150–400)
RBC: 4.25 MIL/uL (ref 3.87–5.11)
RDW: 13.6 % (ref 11.5–15.5)
WBC: 14.8 10*3/uL — ABNORMAL HIGH (ref 4.0–10.5)
nRBC: 0 % (ref 0.0–0.2)

## 2018-02-08 LAB — BASIC METABOLIC PANEL
Anion gap: 9 (ref 5–15)
BUN: 39 mg/dL — ABNORMAL HIGH (ref 8–23)
CO2: 36 mmol/L — ABNORMAL HIGH (ref 22–32)
Calcium: 8.9 mg/dL (ref 8.9–10.3)
Chloride: 103 mmol/L (ref 98–111)
Creatinine, Ser: 0.73 mg/dL (ref 0.44–1.00)
GFR calc Af Amer: >60 mL/min (ref >60)
GFR calc non Af Amer: >60 mL/min (ref >60)
Glucose, Bld: 131 mg/dL — ABNORMAL HIGH (ref 70–99)
Potassium: 4.7 mmol/L (ref 3.5–5.1)
Sodium: 148 mmol/L — ABNORMAL HIGH (ref 135–145)

## 2018-02-08 LAB — GLUCOSE, CAPILLARY
Glucose-Capillary: 133 mg/dL — ABNORMAL HIGH (ref 70–99)
Glucose-Capillary: 100 mg/dL — ABNORMAL HIGH (ref 70–99)
Glucose-Capillary: 106 mg/dL — ABNORMAL HIGH (ref 70–99)
Glucose-Capillary: 169 mg/dL — ABNORMAL HIGH (ref 70–99)
Glucose-Capillary: 109 mg/dL — ABNORMAL HIGH (ref 70–99)

## 2018-02-08 MED ORDER — LISINOPRIL 20 MG PO TABS
20.0000 mg | ORAL_TABLET | Freq: Every day | ORAL | Status: DC
  Administered 2018-02-08 – 2018-02-15 (×8): 20 mg via ORAL
  Filled 2018-02-08 (×9): qty 1

## 2018-02-08 MED ORDER — METHYLPREDNISOLONE SODIUM SUCC 40 MG IJ SOLR
40.0000 mg | Freq: Two times a day (BID) | INTRAMUSCULAR | Status: AC
  Administered 2018-02-08: 40 mg via INTRAVENOUS
  Filled 2018-02-08: qty 1

## 2018-02-08 MED ORDER — ORAL CARE MOUTH RINSE
15.0000 mL | Freq: Two times a day (BID) | OROMUCOSAL | Status: DC
  Administered 2018-02-09 – 2018-02-15 (×9): 15 mL via OROMUCOSAL

## 2018-02-08 MED ORDER — PREDNISONE 10 MG PO TABS
10.0000 mg | ORAL_TABLET | Freq: Every day | ORAL | Status: DC
  Administered 2018-02-09 – 2018-02-10 (×2): 10 mg via ORAL
  Filled 2018-02-08 (×3): qty 1

## 2018-02-08 NOTE — Progress Notes (Signed)
[place] [facility] [place] NAME: Autumn [name]    MR#:  [id number]  DATE OF BIRTH:  1945/05/30  SUBJECTIVE:  CHIEF COMPLAINT:   Chief Complaint  Patient presents with  . Shortness of Breath  Continues to require BiPAP,  in discussion with intensivist-we will continue to need aggressive pulmonary toilet/BiPAP  REVIEW OF SYSTEMS:  CONSTITUTIONAL: No fever, fatigue or weakness.  EYES: No blurred or double vision.  EARS, NOSE, AND THROAT: No tinnitus or ear pain.  RESPIRATORY: No cough, shortness of breath, wheezing or hemoptysis.  CARDIOVASCULAR: No chest pain, orthopnea, edema.  GASTROINTESTINAL: No nausea, vomiting, diarrhea or abdominal pain.  GENITOURINARY: No dysuria, hematuria.  ENDOCRINE: No polyuria, nocturia,  HEMATOLOGY: No anemia, easy bruising or bleeding SKIN: No rash or lesion. MUSCULOSKELETAL: No joint pain or arthritis.   NEUROLOGIC: No tingling, numbness, weakness.  PSYCHIATRY: No anxiety or depression.   ROS  DRUG ALLERGIES:   Allergies  Allergen Reactions  . Ivp Dye [Iodinated Diagnostic Agents] Hives and Itching    VITALS:  Blood pressure (!) 148/64, pulse 95, temperature 98.3 F (36.8 C), temperature source Axillary, resp. rate (!) 22, height 5\' 8"  (1.727 m), weight 55.2 kg, SpO2 100 %.  PHYSICAL EXAMINATION:  GENERAL:  72 y.o.-year-old patient lying in the bed with no acute distress.  EYES: Pupils equal, round, reactive to light and accommodation. No scleral icterus. Extraocular muscles intact.  HEENT: Head atraumatic, normocephalic. Oropharynx and nasopharynx clear.  NECK:  Supple, no jugular venous distention. No thyroid enlargement, no tenderness.  LUNGS: Normal breath sounds bilaterally, no wheezing, rales,rhonchi or crepitation. No use of accessory muscles of respiration.  CARDIOVASCULAR: S1, S2 normal. No murmurs, rubs, or gallops.  ABDOMEN: Soft, nontender, nondistended. Bowel sounds present. No organomegaly  or mass.  EXTREMITIES: No pedal edema, cyanosis, or clubbing.  NEUROLOGIC: Cranial nerves II through XII are intact. Muscle strength 5/5 in all extremities. Sensation intact. Gait not checked.  PSYCHIATRIC: The patient is alert and oriented x 3.  SKIN: No obvious rash, lesion, or ulcer.   Physical Exam LABORATORY PANEL:   CBC Recent Labs  Lab 02/08/18 0327  WBC 14.8*  HGB 11.2*  HCT 38.5  PLT 236   ------------------------------------------------------------------------------------------------------------------  Chemistries  Recent Labs  Lab 02/07/18 0324 02/08/18 0327  NA 145 148*  K 3.9 4.7  CL 101 103  CO2 35* 36*  GLUCOSE 126* 131*  BUN 41* 39*  CREATININE 0.73 0.73  CALCIUM 9.2 8.9  MG 2.7*  --    ------------------------------------------------------------------------------------------------------------------  Cardiac Enzymes Recent Labs  Lab 02/04/18 1430 02/05/18 0542  TROPONINI <0.03 0.03*   ------------------------------------------------------------------------------------------------------------------  RADIOLOGY:  Dg Chest Port 1 View  Result Date: 02/07/2018 CLINICAL DATA:  Respiratory failure.  History of sarcoidosis. EXAM: PORTABLE CHEST 1 VIEW COMPARISON:  Chest radiograph and chest CT February 04, 2018 FINDINGS: There is scarring in the left upper lobe with asymmetric apical pleural thickening, stable. There are calcifications in this area as well. There is a moderate degree of scarring in the right upper lobe. There is mild bibasilar scarring. There is no edema or consolidation. The cardiac silhouette is normal. There are calcified hilar and mediastinal lymph nodes. No adenopathy by size criteria evident. No bone lesions. Scoliosis stable. IMPRESSION: Stable upper lobe scarring, more severe on the left than on the right with asymmetric left apical pleural thickening, stable. Areas of calcification in the left upper lobe stable. Milder scarring right  base. Calcified mediastinal and hilar  lymph nodes. These are changes consistent with residua from prior sarcoidosis. No edema or consolidation. Stable cardiac silhouette. Stable scoliosis. Electronically Signed   By: Lowella Grip III M.D.   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 No improvement continue aggressive pulmonary toilet bronchodilator therapy, IV Solu-Medrol with tapering as tolerated, empiric Rocephin/azithromycin, supplemental oxygen/BiPAP with weaning as tolerated, follow-up on cultures Echocardiogram noted for 45% ejection fraction, severe pulmonary hypertension  *Acute on COPD exacerbation Plan of care as stated above  *Acute PRES Will need good blood pressure control   *Acute malignant hypertension  Controlled on current regiment   *Chronic hyperlipidemia Stable on statin rx   All the records are reviewed and case discussed with Care Management/Social Workerr. Management plans discussed with the patient, family and they are in agreement.  CODE STATUS: full  TOTAL TIME TAKING CARE OF THIS PATIENT: 35 minutes.   POSSIBLE D/C IN 2-3 DAYS, DEPENDING ON CLINICAL CONDITION.   Avel Peace Salary M.D on 02/08/2018   Between 7am to 6pm - Pager - [phone]  After 6pm go to [email or web address] - password [email or web address] [facility]  Office  [phone]  CC: Primary care physician; Baxter Hire, MD  Note: This dictation was prepared with Dragon dictation along with smaller phrase technology. Any transcriptional errors that result from this process are unintentional.

## 2018-02-08 NOTE — Progress Notes (Signed)
PT Cancellation Note  Patient Details Name: Autumn [name] MRN: [id number] DOB: [birth year]   Cancelled Treatment:    Reason Eval/Treat Not Completed: Medical issues which prohibited therapy.  Pt remains on BiPaP and unable to participate in PT evaluation.  Will complete order at this time.  Please consult later if need arises.   Roxanne Gates, PT, DPT 02/08/2018, 1:22 PM

## 2018-02-08 NOTE — Progress Notes (Signed)
Name: Autumn [name] MRN: [id number] DOB: 1945-08-30     CONSULTATION DATE: 02/04/2018 Subjective & objectives: High flow nasal cannula 45%  PAST MEDICAL HISTORY :   has a past medical history of Allergy, COPD (chronic obstructive pulmonary disease) ([place]), Glaucoma, Hypertension, Osteoporosis, Ovarian cancer ([place]), and Sarcoid.  has a past surgical history that includes Breast biopsy (Right) and Abdominal hysterectomy. Prior to Admission medications   Medication Sig Start Date End Date Taking? Authorizing Provider  acetaminophen (TYLENOL) 500 MG tablet Take 500 mg by mouth every 4 (four) hours as needed for mild pain or moderate pain.   Yes [provider]  albuterol (PROVENTIL) (2.5 MG/3ML) 0.083% nebulizer solution Take 2.5 mg by nebulization every 6 (six) hours as needed for wheezing or shortness of breath.    Yes [provider]  atenolol (TENORMIN) 50 MG tablet Take 50 mg by mouth daily.    Yes [provider]  atorvastatin (LIPITOR) 10 MG tablet Take 1 tablet by mouth daily.    Yes [provider]  Buprenorphine HCl (BELBUCA) 150 MCG FILM Place 150 mcg inside cheek every 12 (twelve) hours. 12/27/17  Yes Gillis Santa, MD  CALCIUM 600+D3 600-400 MG-UNIT TABS Take 1 tablet by mouth 2 (two) times daily with a meal.    Yes [provider]  denosumab (PROLIA) 60 MG/ML SOLN injection Inject 60 mg into the skin as directed.  04/27/17  Yes [provider]  fluticasone (FLONASE) 50 MCG/ACT nasal spray Place 2 sprays into both nostrils daily.   Yes [provider]  Fluticasone-Salmeterol (ADVAIR DISKUS) 250-50 MCG/DOSE AEPB Inhale 1 puff into the lungs 2 (two) times daily.    Yes [provider]  lidocaine (XYLOCAINE) 5 % ointment Apply 1 application topically 4 (four) times daily as needed for moderate pain. 11/29/17 02/27/18 Yes Gillis Santa, MD  lidocaine (XYLOCAINE) 5 % ointment Apply 1 application topically 4 (four)  times daily as needed for mild pain.   Yes [provider]  montelukast (SINGULAIR) 10 MG tablet Take 10 mg by mouth at bedtime.   Yes [provider]  Omega-3 Fatty Acids (FISH OIL) 1000 MG CAPS Take 1,000 mg by mouth daily.    Yes [provider]  predniSONE (DELTASONE) 10 MG tablet Take 10 mg by mouth daily.  06/20/17  Yes [provider]  pregabalin (LYRICA) 100 MG capsule Take 1 capsule (100 mg total) by mouth 3 (three) times daily. 11/22/17  Yes Gillis Santa, MD  Propylene Glycol (SYSTANE BALANCE) 0.6 % SOLN Place 1 drop into both eyes daily as needed (dryness).   Yes [provider]  sertraline (ZOLOFT) 25 MG tablet Take 25 mg by mouth daily. 02/16/17  Yes [provider]  tiZANidine (ZANAFLEX) 4 MG tablet Take 1 tablet (4 mg total) by mouth 2 (two) times daily as needed for muscle spasms. Patient not taking: Reported on 02/06/2018 10/20/17 10/20/18  Gillis Santa, MD   Allergies  Allergen Reactions  . Ivp Dye [Iodinated Diagnostic Agents] Hives and Itching    FAMILY HISTORY:  family history includes Breast cancer (age of onset: 43) in her mother; Cancer in her mother; Hypertension in her father. SOCIAL HISTORY:  reports that she has never smoked. She has never used smokeless tobacco. She reports that she does not drink alcohol or use drugs.  REVIEW OF SYSTEMS:   Unable to obtain due to critical illness   VITAL SIGNS: Temp:  [98 F (36.7 C)-98.6 F (37 C)] 98.3  F (36.8 C) (10/30 0800) Pulse Rate:  [66-99] 78 (10/30 1400) Resp:  [13-26] 26 (10/30 1400) BP: (113-202)/(54-87) 144/61 (10/30 1400) SpO2:  [90 %-100 %] 100 % (10/30 1400) FiO2 (%):  [42 %-60 %] 45 % (10/30 0400) Weight:  [55.2 kg] 55.2 kg (10/30 0500)   Physical examination: Awake and oriented and no focal neurological deficits On HFNC , no distress, bilateral equal air entry with no adventitious sounds S1 sinus rhythm no murmur Benign abdominal exam with  normal peristalsis No leg edema  Assessment and plan:   *Acute on chronic respiratory failure with baseline home O2 3 L nasal cannula.Tolerating high flow nasal cannula 45%. *Pulmonary sarcoidosis steroid-dependent *Pulmonary hypertension.  *Acute exacerbation of COPD -Titrate FiO2 to keep O2 sat 88 to 92% -Optimize bronchodilators, inhaled steroids, systemic steroids, empiric antibiotics and diuresis to improve lung compliance.  Anxiety -Optimize Xanax  Atelectasis and pneumonia. -c/w Rocephin, d/c Zithro. Monitor CXR + CBC + FiO2 requirement -Consider de-escalation of antimicrobial if markers for infective etiology remains negative.  Hypertension.Echocardiogram 02/05/2018 LVEF 75% grade 1 diastolic dysfunction and diffuse hypokinesis -Optimize antihypertensives and monitor hemodynamics  Prerenal azotemia and hypernatremia with intravascular volume depletion -Optimize hydration, monitor renal panel and urine output  Anemia -Keep hemoglobin more than 7 g/dL  Full code  DVT &GI prophylaxis. Continue with supportive care Mr. Defrank was updated at the bedside and he agreed with the plan of care Critical care time 35 minutes

## 2018-02-09 ENCOUNTER — Inpatient Hospital Stay: Payer: Medicare Other

## 2018-02-09 DIAGNOSIS — D860 Sarcoidosis of lung: Secondary | ICD-10-CM

## 2018-02-09 LAB — CBC WITH DIFFERENTIAL/PLATELET
Abs Immature Granulocytes: 0.09 10*3/uL — ABNORMAL HIGH (ref 0.00–0.07)
Basophils Absolute: 0 10*3/uL (ref 0.0–0.1)
Basophils Relative: 0 %
Eosinophils Absolute: 0 10*3/uL (ref 0.0–0.5)
Eosinophils Relative: 0 %
HCT: 37.6 % (ref 36.0–46.0)
Hemoglobin: 10.9 g/dL — ABNORMAL LOW (ref 12.0–15.0)
Immature Granulocytes: 1 %
Lymphocytes Relative: 3 %
Lymphs Abs: 0.4 10*3/uL — ABNORMAL LOW (ref 0.7–4.0)
MCH: 26 pg (ref 26.0–34.0)
MCHC: 29 g/dL — ABNORMAL LOW (ref 30.0–36.0)
MCV: 89.5 fL (ref 80.0–100.0)
Monocytes Absolute: 1 10*3/uL (ref 0.1–1.0)
Monocytes Relative: 7 %
Neutro Abs: 12.8 10*3/uL — ABNORMAL HIGH (ref 1.7–7.7)
Neutrophils Relative %: 89 %
Platelets: 240 10*3/uL (ref 150–400)
RBC: 4.2 MIL/uL (ref 3.87–5.11)
RDW: 13.5 % (ref 11.5–15.5)
WBC: 14.3 10*3/uL — ABNORMAL HIGH (ref 4.0–10.5)
nRBC: 0 % (ref 0.0–0.2)

## 2018-02-09 LAB — GLUCOSE, CAPILLARY
Glucose-Capillary: 101 mg/dL — ABNORMAL HIGH (ref 70–99)
Glucose-Capillary: 79 mg/dL (ref 70–99)

## 2018-02-09 NOTE — Progress Notes (Signed)
Family Meeting Note  Advance Directive:no  Today a meeting took place with the Patient.   The following clinical team members were present during this meeting:MD  The following were discussed:Patient's diagnosis: Acute on chronic hypoxic respiratory failure, sarcoidosis flare patient's progosis: Unable to determine and Goals for treatment: DNR  Additional follow-up to be provided: Patient is now DNR and is agreeable for palliative care consultation.  Palliative care consult is placed.  DNR written in the patient's chart.  Time spent during discussion: 20 minutes  Autumn Hoaglin, MD

## 2018-02-09 NOTE — Progress Notes (Signed)
Nutrition Follow-up  DOCUMENTATION CODES:   Severe malnutrition in context of chronic illness, Underweight  INTERVENTION:  Continue Ensure Enlive po BID, each supplement provides 350 kcal and 20 grams of protein.  Continue daily MVI.  NUTRITION DIAGNOSIS:   Severe Malnutrition related to chronic illness(sarcoidosis, COPD, hx ovarian cancer) as evidenced by severe fat depletion, severe muscle depletion.  Ongoing.  GOAL:   Patient will meet greater than or equal to 90% of their needs  Progressing.  MONITOR:   PO intake, Supplement acceptance, Weight trends, Labs, I & O's  REASON FOR ASSESSMENT:   Malnutrition Screening Tool, Consult Assessment of nutrition requirement/status  ASSESSMENT:   72 year old female with PMHx of sarcoidosis, COPD, glaucoma, HTN, hx ovarian cancer, OP who is admitted with acute exacerbation of sarcoidosis, acute exacerbation of COPD, pulmonary hypertension.  Patient's intake has been 0-20%. However yesterday for lunch she finished 90% of her meal. She is drinking 1-2 bottles of Ensure per day. Per chart patient had 600 mL UOP yesterday + 3 occurrences unmeasured UOP. Had 2 BMs yesterday.  Medications reviewed and include: Oscal with D 1 tablet BID, Colace, famotidine, lisinopril, MVI daily, Lovaza 1 gram daily, prednisone 10 mg daily, sertraline, NS @ 50 mL/hr.  Labs reviewed: CBG 79-109. On 10/30 Sodium 148, CO2 36, BUN 39.  Diet Order:   Diet Order            Diet regular Room service appropriate? Yes; Fluid consistency: Thin  Diet effective now              EDUCATION NEEDS:   Education needs have been addressed  Skin:  Skin Assessment: Reviewed RN Assessment  Last BM:  02/09/2018 - smear type 7  Height:   Ht Readings from Last 1 Encounters:  02/08/18 5\' 7"  (1.702 m)    Weight:   Wt Readings from Last 1 Encounters:  02/09/18 56.8 kg    Ideal Body Weight:  63.6 kg  BMI:  Body mass index is 19.61 kg/m.  Estimated  Nutritional Needs:   Kcal:  1400-1600  Protein:  70-80 grams  Fluid:  1.4-1.6 L/day  Willey Blade, MS, RD, LDN Office: [phone] Pager: [phone] After Hours/Weekend Pager: [phone]

## 2018-02-09 NOTE — Progress Notes (Signed)
[place] at [place] NAME: Autumn [name]    MR#:  [id number]  DATE OF BIRTH:  [birth year]  SUBJECTIVE:  Oxygen is being weaned down.  Shortness of breath with some improvement.  REVIEW OF SYSTEMS:    Review of Systems  Constitutional: Negative for fever, chills weight loss HENT: Negative for ear pain, nosebleeds, congestion, facial swelling, rhinorrhea, neck pain, neck stiffness and ear discharge.   Respiratory: Positive for cough, shortness of breath, wheezing  Cardiovascular: Negative for chest pain, palpitations and leg swelling.  Gastrointestinal: Negative for heartburn, abdominal pain, vomiting, diarrhea or consitpation Genitourinary: Negative for dysuria, urgency, frequency, hematuria Musculoskeletal: Negative for back pain or joint pain Neurological: Negative for dizziness, seizures, syncope, focal weakness,  numbness and headaches.  Hematological: Does not bruise/bleed easily.  Psychiatric/Behavioral: Negative for hallucinations, confusion, dysphoric mood    Tolerating Diet: yes      DRUG ALLERGIES:   Allergies  Allergen Reactions  . Ivp Dye [Iodinated Diagnostic Agents] Hives and Itching    VITALS:  Blood pressure (!) 143/68, pulse 75, temperature 98.7 F (37.1 C), resp. rate 17, height 5\' 7"  (1.702 m), weight 56.8 kg, SpO2 96 %.  PHYSICAL EXAMINATION:  Constitutional: Appears well-developed and well-nourished. No distress.  Frail-appearing HENT: Normocephalic. [name] Oropharynx is clear and moist.  Eyes: Conjunctivae and EOM are normal. PERRLA, no scleral icterus.  Neck: Normal ROM. Neck supple. No JVD. No tracheal deviation. CVS: RRR, S1/S2 +, no murmurs, no gallops, no carotid bruit.  Pulmonary: Normal respiratory effort with bilateral inspiratory crackles and rhonchi Abdominal: Soft. BS +,  no distension, tenderness, rebound or guarding.  Musculoskeletal: Normal range of motion. No edema and no tenderness.  Neuro:  Alert. CN 2-12 grossly intact. No focal deficits. Skin: Skin is warm and dry. No rash noted. Psychiatric: Normal mood and affect.      LABORATORY PANEL:   CBC Recent Labs  Lab 02/09/18 0552  WBC 14.3*  HGB 10.9*  HCT 37.6  PLT 240   ------------------------------------------------------------------------------------------------------------------  Chemistries  Recent Labs  Lab 02/07/18 0324 02/08/18 0327  NA 145 148*  K 3.9 4.7  CL 101 103  CO2 35* 36*  GLUCOSE 126* 131*  BUN 41* 39*  CREATININE 0.73 0.73  CALCIUM 9.2 8.9  MG 2.7*  --    ------------------------------------------------------------------------------------------------------------------  Cardiac Enzymes Recent Labs  Lab 02/04/18 1430 02/05/18 0542  TROPONINI <0.03 0.03*   ------------------------------------------------------------------------------------------------------------------  RADIOLOGY:  Dg Chest Port 1 View  Result Date: 02/09/2018 CLINICAL DATA:  Respiratory distress.  Follow-up exam. EXAM: PORTABLE CHEST 1 VIEW COMPARISON:  02/07/2018 and multiple prior studies. FINDINGS: No change in bilateral upper lobe and right mid lung scarring. No new lung opacities. No evidence pneumonia or pulmonary edema. No pleural effusion or pneumothorax. IMPRESSION: 1. No acute findings. 2. Stable chronic changes including extensive upper lobe scarring. Electronically Signed   By: Lajean Manes M.D.   On: 02/09/2018 07:11     ASSESSMENT AND PLAN:   72 year old female with history of chronic hypoxic respiratory failure on 3 L oxygen at home due to sarcoid who presented with increasing shortness of breath.  1.  Acute on chronic hypoxic respiratory failure due to sarcoid flare: Wean high flow nasal cannula to baseline as tolerated  Patient is steroid-dependent Wean steroids as tolerated Keep O2 88 to 92% as per pulmonary Continue bronchodilators and inhaled steroids  2.  Hyperlipidemia: Continue  statin  3.  Acute PRES 1 second and will  go back with history of essential hypertension: Continue lisinopril   Management plans discussed with the patient and she is in agreement.  CODE STATUS: DNR  TOTAL TIME TAKING CARE OF THIS PATIENT: 28 minutes.     POSSIBLE D/C ??, DEPENDING ON CLINICAL CONDITION.   Daiya Tamer M.D on 02/09/2018 at 11:30 AM  Between 7am to 6pm - Pager - [phone] After 6pm go to [email or web address] - password [email or web address] [facility]  Office  [phone]  CC: Primary care physician; Baxter Hire, MD  Note: This dictation was prepared with Dragon dictation along with smaller phrase technology. Any transcriptional errors that result from this process are unintentional.

## 2018-02-09 NOTE — Care Management Important Message (Signed)
Important Message  Patient Details  Name: Autumn [name] MRN: [id number] Date of Birth: 09-22-1945   Medicare Important Message Given:  Yes    Juliann Pulse A Kynzi Levay 02/09/2018, 10:48 AM

## 2018-02-09 NOTE — Progress Notes (Signed)
She is seen in follow-up after transfer from ICU/SDU She is comfortable on [place] O2 She has no new complaints She has minimal cough and denies chest pain  Vitals:   02/09/18 0208 02/09/18 0416 02/09/18 0845 02/09/18 1334  BP:  (!) 143/68  (!) 168/74  Pulse:  75  81  Resp:  17  20  Temp:  98.7 F (37.1 C)  97.8 F (36.6 C)  TempSrc:    Oral  SpO2: 99% 100% 96% 100%  Weight:  56.8 kg    Height:       5 LPM [place]  NAD HEENT WNL No JVD or lymphadenopathy Diffuse coarse wheezes Moderate kyphoscoliosis RRR, no M NABS, soft No C/C/E No focal neurologic deficits  BMP Latest Ref Rng & Units 02/08/2018 02/07/2018 02/06/2018  Glucose 70 - 99 mg/dL 131(H) 126(H) 118(H)  BUN 8 - 23 mg/dL 39(H) 41(H) 24(H)  Creatinine 0.44 - 1.00 mg/dL 0.73 0.73 0.74  Sodium 135 - 145 mmol/L 148(H) 145 146(H)  Potassium 3.5 - 5.1 mmol/L 4.7 3.9 4.0  Chloride 98 - 111 mmol/L 103 101 100  CO2 22 - 32 mmol/L 36(H) 35(H) 35(H)  Calcium 8.9 - 10.3 mg/dL 8.9 9.2 9.4   CBC Latest Ref Rng & Units 02/09/2018 02/08/2018 02/07/2018  WBC 4.0 - 10.5 K/uL 14.3(H) 14.8(H) 13.3(H)  Hemoglobin 12.0 - 15.0 g/dL 10.9(L) 11.2(L) 11.9(L)  Hematocrit 36.0 - 46.0 % 37.6 38.5 40.5  Platelets 150 - 400 K/uL 240 236 264   CXR: Severe kyphoscoliosis.  Chronic bilateral upper lobe changes.  No acute findings.  IMP: Chronic pulmonary sarcoidosis COPD on the basis of sarcoidosis Acute/chronic hypoxemic respiratory failure Persistent wheezing  PLAN/REC: Continue supplemental O2 maintaining SPO2 >90%.  Wean as tolerated to baseline of 2 LPM Continue systemic steroids at her current dose of prednisone 10 mg (this is her long-standing baseline dose Continue nebulized steroids and bronchodilators Advance diet and activity Discharge planning per primary team Please note that she is followed by Dr. Raul Del as an outpatient PCCM service will sign off.  Please call if we can be of further assistance.  Merton Border, MD PCCM  service Mobile [phone] Pager [phone] 02/09/2018 1:55 PM

## 2018-02-10 MED ORDER — METHYLPREDNISOLONE SODIUM SUCC 125 MG IJ SOLR
60.0000 mg | INTRAMUSCULAR | Status: DC
  Administered 2018-02-10 – 2018-02-11 (×2): 60 mg via INTRAVENOUS
  Filled 2018-02-10 (×2): qty 2

## 2018-02-10 NOTE — Evaluation (Signed)
Physical Therapy Evaluation Patient Details Name: Autumn [name] MRN: [id number] DOB: 1946/01/29 Today's Date: 02/10/2018   History of Present Illness   72 y.o. female with a known history of pulmonary sarcoidosis, COPD, chronic respiratory failure on continuous 2 L oxygen per nasal cannula at home.  She was having increased weezing, shortness of breath, weakness and ultimately needed to come to the ED and spent nearly a week in the CCU.  Clinical Impression  Pt with some wheezing and shortness of breath on arrival, but actually showed good strength with U&LEs testing, did well with mobility and was able to ambulate ~35 ft with walker.  She was slow and needed a few standing rest breaks (as well as an increase in O2 from 3 to 4 liters secondary to sats dropping to mid 80s) but as she has done no real ambulation in ~1 weeks this was relatively good.  Per medical situation she should be good to return home with HHPT and the assist she will have from husband.  Did educate on [facility] program should further pulmonary focused exercises be necessary.    Follow Up Recommendations Home health PT(possible trasition to [facility] s/p HHPT if needed)    Equipment Recommendations    none recommended   Recommendations for Other Services       Precautions / Restrictions Precautions Precautions: Fall Restrictions Weight Bearing Restrictions: No      Mobility  Bed Mobility Overal bed mobility: Independent             General bed mobility comments: Pt easily gets to EOB w/o assist, no dizziness but some initial fatigue  Transfers Overall transfer level: Modified independent Equipment used: Rolling walker (2 wheeled)             General transfer comment: Pt needed multiple repeated cues to set up (U&LEs) and sequence getting to standing  Ambulation/Gait Ambulation/Gait assistance: Min guard Gait Distance (Feet): 35 Feet Assistive device: Rolling walker (2 wheeled)       General  Gait Details: Pt initially on 3 liters, sats dropped to mid/high 80s, on 4 liters she stayed 95% or higher.  She needed to brief standing rest breaks and fatigued quickly but overall showed good safety and mechanics with ambulation.  Pt actually did better than expected given the inherent lack of mobility over the last week.  Stairs            Wheelchair Mobility    Modified Rankin (Stroke Patients Only)       Balance Overall balance assessment: Modified Independent                                           Pertinent Vitals/Pain Pain Assessment: No/denies pain    Home Living Family/patient expects to be discharged to:: Private residence Living Arrangements: Spouse/significant other   Type of Home: House Home Access: Stairs to enter Entrance Stairs-Rails: None Entrance Stairs-Number of Steps: 3   Home Equipment: Environmental consultant - 2 wheels;Cane - single point      Prior Function Level of Independence: Independent with assistive device(s)         Comments: occasionally gets around w/o AD, does not drive on go on long outings but stays relatively active     Hand Dominance        Extremity/Trunk Assessment   Upper Extremity Assessment Upper Extremity Assessment: Overall WFL for tasks  assessed;Generalized weakness    Lower Extremity Assessment Lower Extremity Assessment: Overall WFL for tasks assessed;Generalized weakness       Communication   Communication: No difficulties  Cognition Arousal/Alertness: Awake/alert Behavior During Therapy: WFL for tasks assessed/performed Overall Cognitive Status: Within Functional Limits for tasks assessed                                        General Comments General comments (skin integrity, edema, etc.): Pt on 3 liters O2 on arrival, sats in the mid/high 90s.  With some bed mobility she dropped to mid 80s and after speaking with nurse we increased to 4 liters for ambulation.  Sats stayed  appropriate on this, returned to 3 liters in recliner post session.     Exercises     Assessment/Plan    PT Assessment Patient needs continued PT services  PT Problem List Decreased strength;Decreased range of motion;Decreased activity tolerance;Decreased balance;Decreased mobility;Decreased coordination;Decreased knowledge of use of DME;Decreased knowledge of precautions;Decreased safety awareness;Cardiopulmonary status limiting activity       PT Treatment Interventions DME instruction;Gait training;Stair training;Functional mobility training;Therapeutic activities;Therapeutic exercise;Balance training;Neuromuscular re-education;Patient/family education    PT Goals (Current goals can be found in the Care Plan section)  Acute Rehab PT Goals Patient Stated Goal: get breathing better PT Goal Formulation: With patient Time For Goal Achievement: 02/24/18 Potential to Achieve Goals: Good    Frequency Min 2X/week   Barriers to discharge        Co-evaluation               AM-PAC PT "6 Clicks" Daily Activity  Outcome Measure Difficulty turning over in bed (including adjusting bedclothes, sheets and blankets)?: None Difficulty moving from lying on back to sitting on the side of the bed? : None Difficulty sitting down on and standing up from a chair with arms (e.g., wheelchair, bedside commode, etc,.)?: A Little Help needed moving to and from a bed to chair (including a wheelchair)?: None Help needed walking in hospital room?: A Little Help needed climbing 3-5 steps with a railing? : A Lot 6 Click Score: 20    End of Session Equipment Utilized During Treatment: Gait belt Activity Tolerance: Patient limited by fatigue Patient left: with chair alarm set;with call bell/phone within reach;with family/visitor present Nurse Communication: Mobility status PT Visit Diagnosis: Difficulty in walking, not elsewhere classified (R26.2);Muscle weakness (generalized) (M62.81)    Time:  2952-8413 PT Time Calculation (min) (ACUTE ONLY): 27 min   Charges:   PT Evaluation $PT Eval Low Complexity: 1 Low PT Treatments $Gait Training: 8-22 mins        Kreg Shropshire, DPT 02/10/2018, 1:51 PM

## 2018-02-10 NOTE — Plan of Care (Signed)

## 2018-02-10 NOTE — Progress Notes (Signed)
[place] at [place] NAME: Autumn [name]    MR#:  [id number]  DATE OF BIRTH:  08/25/1945  SUBJECTIVE:  CHIEF COMPLAINT:   Chief Complaint  Patient presents with  . Shortness of Breath   -Still very short of breath on minimal exertion.  Wheezing this morning -On 3 L oxygen via nasal cannula  REVIEW OF SYSTEMS:  Review of Systems  Constitutional: Positive for malaise/fatigue. Negative for chills and fever.  HENT: Negative for hearing loss.   Eyes: Negative for blurred vision and double vision.  Respiratory: Positive for shortness of breath and wheezing. Negative for cough.   Cardiovascular: Negative for chest pain and palpitations.  Gastrointestinal: Negative for abdominal pain, constipation, diarrhea, nausea and vomiting.  Genitourinary: Negative for dysuria.  Musculoskeletal: Negative for myalgias.  Neurological: Negative for dizziness, focal weakness, seizures, weakness and headaches.  Psychiatric/Behavioral: Negative for depression.    DRUG ALLERGIES:   Allergies  Allergen Reactions  . Ivp Dye [Iodinated Diagnostic Agents] Hives and Itching    VITALS:  Blood pressure 136/76, pulse 80, temperature 98.6 F (37 C), temperature source Oral, resp. rate 20, height 5\' 7"  (1.702 m), weight 54.5 kg, SpO2 99 %.  PHYSICAL EXAMINATION:  Physical Exam  GENERAL:  72 y.o.-year-old thin built patient lying in the bed with no acute distress.  EYES: Pupils equal, round, reactive to light and accommodation. No scleral icterus. Extraocular muscles intact.  HEENT: Head atraumatic, normocephalic. Oropharynx and nasopharynx clear.  NECK:  Supple, no jugular venous distention. No thyroid enlargement, no tenderness.  LUNGS: Diffuse expiratory wheezing all over the lung fields, worse at the bases, no rales,rhonchi or crepitation. No use of accessory muscles of respiration.  CARDIOVASCULAR: S1, S2 normal. No  rubs, or gallops. 2/6 systolic murmur  present ABDOMEN: Soft, nontender, nondistended. Bowel sounds present. No organomegaly or mass.  EXTREMITIES: No pedal edema, cyanosis, or clubbing.  NEUROLOGIC: Cranial nerves II through XII are intact. Muscle strength 5/5 in all extremities. Sensation intact. Gait not checked.  PSYCHIATRIC: The patient is alert and oriented x 3.  SKIN: No obvious rash, lesion, or ulcer.    LABORATORY PANEL:   CBC Recent Labs  Lab 02/09/18 0552  WBC 14.3*  HGB 10.9*  HCT 37.6  PLT 240   ------------------------------------------------------------------------------------------------------------------  Chemistries  Recent Labs  Lab 02/07/18 0324 02/08/18 0327  NA 145 148*  K 3.9 4.7  CL 101 103  CO2 35* 36*  GLUCOSE 126* 131*  BUN 41* 39*  CREATININE 0.73 0.73  CALCIUM 9.2 8.9  MG 2.7*  --    ------------------------------------------------------------------------------------------------------------------  Cardiac Enzymes Recent Labs  Lab 02/05/18 0542  TROPONINI 0.03*   ------------------------------------------------------------------------------------------------------------------  RADIOLOGY:  Dg Chest Port 1 View  Result Date: 02/09/2018 CLINICAL DATA:  Respiratory distress.  Follow-up exam. EXAM: PORTABLE CHEST 1 VIEW COMPARISON:  02/07/2018 and multiple prior studies. FINDINGS: No change in bilateral upper lobe and right mid lung scarring. No new lung opacities. No evidence pneumonia or pulmonary edema. No pleural effusion or pneumothorax. IMPRESSION: 1. No acute findings. 2. Stable chronic changes including extensive upper lobe scarring. Electronically Signed   By: Lajean Manes M.D.   On: 02/09/2018 07:11    EKG:   Orders placed or performed during the hospital encounter of 02/04/18  . ED EKG within 10 minutes  . ED EKG within 10 minutes  . EKG 12-Lead  . EKG 12-Lead    ASSESSMENT AND PLAN:   72 year old female with  past medical history significant for chronic  respiratory failure secondary to COPD on 2 L home oxygen, pulmonary sarcoidosis, hypertension, glaucoma and osteoporosis presents to hospital secondary to worsening shortness of breath.  1.  Acute on chronic hypoxic respiratory failure-secondary to COPD exacerbation -Admitted to ICU, was on BiPAP. -Currently still requiring 3 L oxygen wheezing and dyspneic on exertion. -Change her steroids to IV again today -Encouraged to do incentive spirometry -Encourage ambulation.  2.  Hypertension-patient on atenolol and lisinopril  3.  Glaucoma-continue eyedrops  4.  Sarcoidosis-patient on chronic oral prednisone.  Can be restarted after her IV taper  5.  DVT prophylaxis-subcutaneous heparin  Physical therapy to encourage ambulation     All the records are reviewed and case discussed with Care Management/Social Workerr. Management plans discussed with the patient, family and they are in agreement.  CODE STATUS: DNR  TOTAL TIME TAKING CARE OF THIS PATIENT: 37 minutes.   POSSIBLE D/C IN 1-2 DAYS, DEPENDING ON CLINICAL CONDITION.   Gladstone Lighter M.D on 02/10/2018 at 10:40 AM  Between 7am to 6pm - Pager - [phone]  After 6pm go to [email or web address] - password [email or web address] [facility]  Office  [phone]  CC: Primary care physician; Baxter Hire, MD

## 2018-02-11 LAB — CBC
HCT: 34 % — ABNORMAL LOW (ref 36.0–46.0)
Hemoglobin: 9.9 g/dL — ABNORMAL LOW (ref 12.0–15.0)
MCH: 26.2 pg (ref 26.0–34.0)
MCHC: 29.1 g/dL — ABNORMAL LOW (ref 30.0–36.0)
MCV: 89.9 fL (ref 80.0–100.0)
Platelets: 217 10*3/uL (ref 150–400)
RBC: 3.78 MIL/uL — ABNORMAL LOW (ref 3.87–5.11)
RDW: 13.4 % (ref 11.5–15.5)
WBC: 13 10*3/uL — ABNORMAL HIGH (ref 4.0–10.5)
nRBC: 0 % (ref 0.0–0.2)

## 2018-02-11 LAB — BASIC METABOLIC PANEL
Anion gap: 8 (ref 5–15)
BUN: 28 mg/dL — ABNORMAL HIGH (ref 8–23)
CO2: 36 mmol/L — ABNORMAL HIGH (ref 22–32)
Calcium: 8.9 mg/dL (ref 8.9–10.3)
Chloride: 102 mmol/L (ref 98–111)
Creatinine, Ser: 0.59 mg/dL (ref 0.44–1.00)
GFR calc Af Amer: >60 mL/min (ref >60)
GFR calc non Af Amer: >60 mL/min (ref >60)
Glucose, Bld: 119 mg/dL — ABNORMAL HIGH (ref 70–99)
Potassium: 3.7 mmol/L (ref 3.5–5.1)
Sodium: 146 mmol/L — ABNORMAL HIGH (ref 135–145)

## 2018-02-11 MED ORDER — METHYLPREDNISOLONE SODIUM SUCC 125 MG IJ SOLR
60.0000 mg | Freq: Two times a day (BID) | INTRAMUSCULAR | Status: DC
  Administered 2018-02-11 – 2018-02-15 (×8): 60 mg via INTRAVENOUS
  Filled 2018-02-11 (×8): qty 2

## 2018-02-11 MED ORDER — ALBUTEROL SULFATE (2.5 MG/3ML) 0.083% IN NEBU
2.5000 mg | INHALATION_SOLUTION | RESPIRATORY_TRACT | Status: DC | PRN
  Administered 2018-02-12: 17:00:00 2.5 mg via RESPIRATORY_TRACT
  Filled 2018-02-11: qty 3

## 2018-02-11 NOTE — Progress Notes (Signed)
[place] at [place] NAME: Autumn [name]    MR#:  [id number]  DATE OF BIRTH:  1945/09/18  SUBJECTIVE:  CHIEF COMPLAINT:   Chief Complaint  Patient presents with  . Shortness of Breath   -Feels some better today.  Continues to have expiratory wheezing all over the lung fields today. -Remains on 2 L oxygen which is her acute O2  REVIEW OF SYSTEMS:  Review of Systems  Constitutional: Positive for malaise/fatigue. Negative for chills and fever.  HENT: Negative for hearing loss.   Eyes: Negative for blurred vision and double vision.  Respiratory: Positive for shortness of breath and wheezing. Negative for cough.   Cardiovascular: Negative for chest pain and palpitations.  Gastrointestinal: Negative for abdominal pain, constipation, diarrhea, nausea and vomiting.  Genitourinary: Negative for dysuria.  Musculoskeletal: Negative for myalgias.  Neurological: Negative for dizziness, focal weakness, seizures, weakness and headaches.  Psychiatric/Behavioral: Negative for depression.    DRUG ALLERGIES:   Allergies  Allergen Reactions  . Ivp Dye [Iodinated Diagnostic Agents] Hives and Itching    VITALS:  Blood pressure 139/69, pulse 99, temperature 98.7 F (37.1 C), temperature source Oral, resp. rate 20, height 5\' 7"  (1.702 m), weight 54.5 kg, SpO2 100 %.  PHYSICAL EXAMINATION:  Physical Exam  GENERAL:  72 y.o.-year-old thin built patient lying in the bed with no acute distress.  EYES: Pupils equal, round, reactive to light and accommodation. No scleral icterus. Extraocular muscles intact.  HEENT: Head atraumatic, normocephalic. Oropharynx and nasopharynx clear.  NECK:  Supple, no jugular venous distention. No thyroid enlargement, no tenderness.  LUNGS: Diffuse expiratory wheezing all over the lung fields, worse at the bases, no rales,rhonchi or crepitation. No use of accessory muscles of respiration.  CARDIOVASCULAR: S1, S2 normal. No   rubs, or gallops. 2/6 systolic murmur present ABDOMEN: Soft, nontender, nondistended. Bowel sounds present. No organomegaly or mass.  EXTREMITIES: No pedal edema, cyanosis, or clubbing.  NEUROLOGIC: Cranial nerves II through XII are intact. Muscle strength 5/5 in all extremities. Sensation intact. Gait not checked.  PSYCHIATRIC: The patient is alert and oriented x 3.  SKIN: No obvious rash, lesion, or ulcer.    LABORATORY PANEL:   CBC Recent Labs  Lab 02/11/18 0538  WBC 13.0*  HGB 9.9*  HCT 34.0*  PLT 217   ------------------------------------------------------------------------------------------------------------------  Chemistries  Recent Labs  Lab 02/07/18 0324  02/11/18 0538  NA 145   < > 146*  K 3.9   < > 3.7  CL 101   < > 102  CO2 35*   < > 36*  GLUCOSE 126*   < > 119*  BUN 41*   < > 28*  CREATININE 0.73   < > 0.59  CALCIUM 9.2   < > 8.9  MG 2.7*  --   --    < > = values in this interval not displayed.   ------------------------------------------------------------------------------------------------------------------  Cardiac Enzymes Recent Labs  Lab 02/05/18 0542  TROPONINI 0.03*   ------------------------------------------------------------------------------------------------------------------  RADIOLOGY:  No results found.  EKG:   Orders placed or performed during the hospital encounter of 02/04/18  . ED EKG within 10 minutes  . ED EKG within 10 minutes  . EKG 12-Lead  . EKG 12-Lead    ASSESSMENT AND PLAN:   72 year old female with past medical history significant for chronic respiratory failure secondary to COPD on 2 L home oxygen, pulmonary sarcoidosis, hypertension, glaucoma and osteoporosis presents to hospital secondary to worsening shortness  of breath.  1.  Acute on chronic hypoxic respiratory failure-secondary to COPD exacerbation -Admitted to ICU, was on BiPAP. -Currently  requiring 2 L oxygen which is her chronic home O2, but still  wheezing  -Continue IV steroids for 1 more day, inhalers and duo nebs -Encouraged to do incentive spirometry -Encourage ambulation. -CT chest last week showing pulmonary sarcoidosis  2.  Hypertension-patient on lisinopril  3.  Glaucoma-continue eyedrops  4.  Sarcoidosis-patient on chronic oral prednisone.  Can be restarted after her IV taper  5.  DVT prophylaxis-subcutaneous heparin  Physical therapy to encourage ambulation  Husband updated at bedside   All the records are reviewed and case discussed with Care Management/Social Workerr. Management plans discussed with the patient, family and they are in agreement.  CODE STATUS: DNR  TOTAL TIME TAKING CARE OF THIS PATIENT: 37 minutes.   POSSIBLE D/C IN 1-2 DAYS, DEPENDING ON CLINICAL CONDITION.   Gladstone Lighter M.D on 02/11/2018 at 12:52 PM  Between 7am to 6pm - Pager - [phone]  After 6pm go to [email or web address] - password [email or web address] [facility]  Office  [phone]  CC: Primary care physician; Baxter Hire, MD

## 2018-02-11 NOTE — Progress Notes (Signed)
Physical Therapy Treatment Patient Details Name: Autumn [name] MRN: [id number] DOB: Dec 20, 1945 Today's Date: 02/11/2018    History of Present Illness  72 y.o. female with a known history of pulmonary sarcoidosis, COPD, chronic respiratory failure on continuous 2 L oxygen per nasal cannula at home.  She was having increased weezing, shortness of breath, weakness and ultimately needed to come to the ED and spent nearly a week in the CCU.    PT Comments    Pt in bed, has been up in chair today but willing to walk.  HR and O2 stable at rest on 2 lpm.  Bed mobility with ease.  Stood and was able to ambulate with slow steady gait and min guard.  When in hallway O2 read 84% HR 148.  Returned to bed where it was rechecked and back at baseline.  ? Accuracy of pulse ox reading.  After seated rest, she requested to try again.  Similar reading noted and again back at baseline upon sitting with no recovery time.  Pt stated she feels good when walking overall.  Discussed readings with RN.    Pt has walker at home.  Encouraged it's use at home upon discharge.  She voiced understanding.  Follow Up Recommendations  Home health PT     Equipment Recommendations  Rolling walker with 5" wheels    Recommendations for Other Services       Precautions / Restrictions Precautions Precautions: Fall Restrictions Weight Bearing Restrictions: No    Mobility  Bed Mobility Overal bed mobility: Independent             General bed mobility comments: Pt easily gets to EOB w/o assist, no dizziness but some initial fatigue  Transfers Overall transfer level: Modified independent Equipment used: Rolling walker (2 wheeled)                Ambulation/Gait Ambulation/Gait assistance: Min guard Gait Distance (Feet): 35 Feet Assistive device: Rolling walker (2 wheeled) Gait Pattern/deviations: Step-through pattern;Decreased step length - right;Decreased step length - left;Trunk flexed Gait velocity:  decreased   General Gait Details: slow and generally "weak" appearance with gait but no LOB or buckling   Stairs             Wheelchair Mobility    Modified Rankin (Stroke Patients Only)       Balance Overall balance assessment: Modified Independent                                          Cognition Arousal/Alertness: Awake/alert Behavior During Therapy: WFL for tasks assessed/performed Overall Cognitive Status: Within Functional Limits for tasks assessed                                        Exercises      General Comments        Pertinent Vitals/Pain Pain Assessment: No/denies pain    Home Living                      Prior Function            PT Goals (current goals can now be found in the care plan section) Progress towards PT goals: Progressing toward goals    Frequency    Min 2X/week  PT Plan Current plan remains appropriate    Co-evaluation              AM-PAC PT "6 Clicks" Daily Activity  Outcome Measure  Difficulty turning over in bed (including adjusting bedclothes, sheets and blankets)?: None Difficulty moving from lying on back to sitting on the side of the bed? : None Difficulty sitting down on and standing up from a chair with arms (e.g., wheelchair, bedside commode, etc,.)?: A Little Help needed moving to and from a bed to chair (including a wheelchair)?: None Help needed walking in hospital room?: A Little Help needed climbing 3-5 steps with a railing? : A Little 6 Click Score: 21    End of Session Equipment Utilized During Treatment: Gait belt Activity Tolerance: Patient limited by fatigue;Treatment limited secondary to medical complications (Comment) Patient left: in bed;with call bell/phone within reach;with bed alarm set;with family/visitor present Nurse Communication: Other (comment)       Time: 0240-9735 PT Time Calculation (min) (ACUTE ONLY): 12  min  Charges:  $Gait Training: 8-22 mins                    Chesley Noon, PTA 02/11/18, 3:53 PM

## 2018-02-11 NOTE — Progress Notes (Deleted)
PT Cancellation Note  Patient Details Name: Autumn [name] MRN: [id number] DOB: Aug 30, 1945   Cancelled Treatment:    Reason Eval/Treat Not Completed: Other (comment)   Pt in recliner, stated she had recently walked unit with nursing staff. Reported no difficulty with mobility today.  Will continue as appropriate.   Chesley Noon 02/11/2018, 12:53 PM

## 2018-02-12 ENCOUNTER — Inpatient Hospital Stay: Payer: Medicare Other

## 2018-02-12 MED ORDER — LEVALBUTEROL HCL 1.25 MG/0.5ML IN NEBU
1.2500 mg | INHALATION_SOLUTION | Freq: Four times a day (QID) | RESPIRATORY_TRACT | Status: DC
  Administered 2018-02-13 (×2): 1.25 mg via RESPIRATORY_TRACT
  Filled 2018-02-12 (×2): qty 0.5

## 2018-02-12 NOTE — Progress Notes (Signed)
[facility] [place] NAME: Autumn [name]    MR#:  [id number]  DATE OF BIRTH:  September 14, 1945  SUBJECTIVE:  CHIEF COMPLAINT:   Chief Complaint  Patient presents with  . Shortness of Breath   -Wheezing still present, especially worse after exertion. -On 2 L oxygen now which is her chronic home O2  REVIEW OF SYSTEMS:  Review of Systems  Constitutional: Positive for malaise/fatigue. Negative for chills and fever.  HENT: Negative for hearing loss.   Eyes: Negative for blurred vision and double vision.  Respiratory: Positive for shortness of breath and wheezing. Negative for cough.   Cardiovascular: Negative for chest pain and palpitations.  Gastrointestinal: Negative for abdominal pain, constipation, diarrhea, nausea and vomiting.  Genitourinary: Negative for dysuria.  Musculoskeletal: Negative for myalgias.  Neurological: Negative for dizziness, focal weakness, seizures, weakness and headaches.  Psychiatric/Behavioral: Negative for depression.    DRUG ALLERGIES:   Allergies  Allergen Reactions  . Ivp Dye [Iodinated Diagnostic Agents] Hives and Itching    VITALS:  Blood pressure (!) 162/82, pulse 99, temperature 97.6 F (36.4 C), temperature source Oral, resp. rate 16, height 5\' 7"  (1.702 m), weight 55.7 kg, SpO2 100 %.  PHYSICAL EXAMINATION:  Physical Exam  GENERAL:  72 y.o.-year-old thin built patient lying in the bed with no acute distress.  EYES: Pupils equal, round, reactive to light and accommodation. No scleral icterus. Extraocular muscles intact.  HEENT: Head atraumatic, normocephalic. Oropharynx and nasopharynx clear.  NECK:  Supple, no jugular venous distention. No thyroid enlargement, no tenderness.  LUNGS: Improved expiratory wheezing all over the lung fields, worse at the bases, no rales,rhonchi or crepitation. No use of accessory muscles of respiration.  CARDIOVASCULAR: S1, S2 normal. No  rubs, or gallops. 2/6 systolic  murmur present ABDOMEN: Soft, nontender, nondistended. Bowel sounds present. No organomegaly or mass.  EXTREMITIES: No pedal edema, cyanosis, or clubbing.  NEUROLOGIC: Cranial nerves II through XII are intact. Muscle strength 5/5 in all extremities. Sensation intact. Gait not checked.  PSYCHIATRIC: The patient is alert and oriented x 3.  SKIN: No obvious rash, lesion, or ulcer.    LABORATORY PANEL:   CBC Recent Labs  Lab 02/11/18 0538  WBC 13.0*  HGB 9.9*  HCT 34.0*  PLT 217   ------------------------------------------------------------------------------------------------------------------  Chemistries  Recent Labs  Lab 02/07/18 0324  02/11/18 0538  NA 145   < > 146*  K 3.9   < > 3.7  CL 101   < > 102  CO2 35*   < > 36*  GLUCOSE 126*   < > 119*  BUN 41*   < > 28*  CREATININE 0.73   < > 0.59  CALCIUM 9.2   < > 8.9  MG 2.7*  --   --    < > = values in this interval not displayed.   ------------------------------------------------------------------------------------------------------------------  Cardiac Enzymes No results for input(s): TROPONINI in the last 168 hours. ------------------------------------------------------------------------------------------------------------------  RADIOLOGY:  No results found.  EKG:   Orders placed or performed during the hospital encounter of 02/04/18  . ED EKG within 10 minutes  . ED EKG within 10 minutes  . EKG 12-Lead  . EKG 12-Lead    ASSESSMENT AND PLAN:   72 year old female with past medical history significant for chronic respiratory failure secondary to COPD on 2 L home oxygen, pulmonary sarcoidosis, hypertension, glaucoma and osteoporosis presents to hospital secondary to worsening shortness of breath.  1.  Acute on chronic  hypoxic respiratory failure-secondary to COPD exacerbation -Admitted to ICU, was on BiPAP. -Currently  requiring 2 L oxygen which is her chronic home O2, but still wheezing -especially with  exertion -Continue IV steroids for today, inhalers and duo nebs -Encouraged to do incentive spirometry -Encourage ambulation. -CT chest last week showing pulmonary sarcoidosis -Repeat chest x-ray today  2.  Hypertension-patient on lisinopril  3.  Glaucoma-continue eyedrops  4.  Sarcoidosis-patient on chronic oral prednisone.  Can be restarted after her IV taper  5.  DVT prophylaxis-subcutaneous heparin  Physical therapy to encourage ambulation  Husband updated at bedside   All the records are reviewed and case discussed with Care Management/Social Workerr. Management plans discussed with the patient, family and they are in agreement.  CODE STATUS: DNR  TOTAL TIME TAKING CARE OF THIS PATIENT: 34 minutes.   POSSIBLE D/C IN 1-2 DAYS, DEPENDING ON CLINICAL CONDITION.   Gladstone Lighter M.D on 02/12/2018 at 1:26 PM  Between 7am to 6pm - Pager - [phone]  After 6pm go to [email or web address] - password [email or web address] [facility]  Office  [phone]  CC: Primary care physician; Baxter Hire, MD

## 2018-02-13 DIAGNOSIS — I5021 Acute systolic (congestive) heart failure: Secondary | ICD-10-CM

## 2018-02-13 DIAGNOSIS — J441 Chronic obstructive pulmonary disease with (acute) exacerbation: Principal | ICD-10-CM

## 2018-02-13 LAB — CBC
HCT: 36.3 % (ref 36.0–46.0)
Hemoglobin: 10.7 g/dL — ABNORMAL LOW (ref 12.0–15.0)
MCH: 26.2 pg (ref 26.0–34.0)
MCHC: 29.5 g/dL — ABNORMAL LOW (ref 30.0–36.0)
MCV: 89 fL (ref 80.0–100.0)
Platelets: 297 10*3/uL (ref 150–400)
RBC: 4.08 MIL/uL (ref 3.87–5.11)
RDW: 13.7 % (ref 11.5–15.5)
WBC: 17.1 10*3/uL — ABNORMAL HIGH (ref 4.0–10.5)
nRBC: 0 % (ref 0.0–0.2)

## 2018-02-13 LAB — BASIC METABOLIC PANEL
Anion gap: 10 (ref 5–15)
BUN: 28 mg/dL — ABNORMAL HIGH (ref 8–23)
CO2: 35 mmol/L — ABNORMAL HIGH (ref 22–32)
Calcium: 10 mg/dL (ref 8.9–10.3)
Chloride: 100 mmol/L (ref 98–111)
Creatinine, Ser: 0.68 mg/dL (ref 0.44–1.00)
GFR calc Af Amer: >60 mL/min (ref >60)
GFR calc non Af Amer: >60 mL/min (ref >60)
Glucose, Bld: 131 mg/dL — ABNORMAL HIGH (ref 70–99)
Potassium: 4.6 mmol/L (ref 3.5–5.1)
Sodium: 145 mmol/L (ref 135–145)

## 2018-02-13 MED ORDER — AZITHROMYCIN 500 MG PO TABS
250.0000 mg | ORAL_TABLET | Freq: Every day | ORAL | Status: DC
  Administered 2018-02-13 – 2018-02-15 (×3): 250 mg via ORAL
  Filled 2018-02-13 (×3): qty 1

## 2018-02-13 MED ORDER — ATENOLOL 50 MG PO TABS
50.0000 mg | ORAL_TABLET | Freq: Every day | ORAL | Status: DC
  Administered 2018-02-13 – 2018-02-15 (×3): 50 mg via ORAL
  Filled 2018-02-13 (×3): qty 1

## 2018-02-13 MED ORDER — IPRATROPIUM-ALBUTEROL 0.5-2.5 (3) MG/3ML IN SOLN
3.0000 mL | RESPIRATORY_TRACT | Status: DC
  Administered 2018-02-13 – 2018-02-15 (×13): 3 mL via RESPIRATORY_TRACT
  Filled 2018-02-13 (×13): qty 3

## 2018-02-13 MED ORDER — FUROSEMIDE 10 MG/ML IJ SOLN
20.0000 mg | Freq: Once | INTRAMUSCULAR | Status: AC
  Administered 2018-02-13: 13:00:00 20 mg via INTRAVENOUS
  Filled 2018-02-13: qty 2

## 2018-02-13 MED ORDER — ARFORMOTEROL TARTRATE 15 MCG/2ML IN NEBU
15.0000 ug | INHALATION_SOLUTION | Freq: Two times a day (BID) | RESPIRATORY_TRACT | Status: DC
  Administered 2018-02-13 – 2018-02-15 (×5): 15 ug via RESPIRATORY_TRACT
  Filled 2018-02-13 (×6): qty 2

## 2018-02-13 NOTE — Care Management Important Message (Signed)
Important Message  Patient Details  Name: Autumn [name] MRN: [id number] Date of Birth: 1945/11/29   Medicare Important Message Given:  Yes    Juliann Pulse A Corde Antonini 02/13/2018, 11:07 AM

## 2018-02-13 NOTE — Plan of Care (Signed)

## 2018-02-13 NOTE — Progress Notes (Signed)
Physical Therapy Treatment Patient Details Name: Autumn [name] MRN: [id number] DOB: 10-24-1945 Today's Date: 02/13/2018    History of Present Illness  72 y.o. female with a known history of pulmonary sarcoidosis, COPD, chronic respiratory failure on continuous 2 L oxygen per nasal cannula at home.  She was having increased weezing, shortness of breath, weakness and ultimately needed to come to the ED and spent nearly a week in the CCU.    PT Comments    Pt in bed resting, sitting upright in bed holding onto ankles, generally hunched over and looking weak.  Wants to attempt therapy.  HR  109 at rest with max 117 during session today.  Bed mobility remains without assist and use of rails.  She required min assist to stand today for short periods of time.  While she was able to march in place x 10, gait was deferred due to general weakness and fatigue with limited activity.  She was able to stand 2 trials but was quite limited in her activity today.  Discussed discharge plan with pt and husband.  She continues to generally decline each therapy session.  They would like to consider SNF to increase overall strength and mobility skills upon discharge.  Discussed with care management, social worker and Therapist, sports.    If pt is to return directly home she will require a wheelchair,  3-in 1 commode, walker, HHPT and assist with all mobility.  Patient suffers from pulmonary sarcoidosis and COPD  which impairs his/her ability to perform daily activities like toileting, dressing, grooming, and bathing in the home. A walker will not resolve the patient's issue with performing activities of daily living. A wheelchair is required/recommended and will allow patient to safely perform daily activities.   Patient can safely propel the wheelchair in the home or has a caregiver who can provide assistance.    Follow Up Recommendations  SNF     Equipment Recommendations       Recommendations for Other Services        Precautions / Restrictions Precautions Precautions: Fall Restrictions Weight Bearing Restrictions: No Other Position/Activity Restrictions: Watch heart rate    Mobility  Bed Mobility Overal bed mobility: Independent                Transfers Overall transfer level: Needs assistance Equipment used: Rolling walker (2 wheeled) Transfers: Sit to/from Stand Sit to Stand: Min guard;Min assist            Ambulation/Gait             General Gait Details: unable   Stairs             Wheelchair Mobility    Modified Rankin (Stroke Patients Only)       Balance Overall balance assessment: Needs assistance Sitting-balance support: Feet supported;Single extremity supported Sitting balance-Leahy Scale: Fair Sitting balance - Comments: able to sit unsupported but leans down on right elbow for support/rest   Standing balance support: Bilateral upper extremity supported Standing balance-Leahy Scale: Poor Standing balance comment: forward lean today with heavy reliance on walker                            Cognition Arousal/Alertness: Awake/alert Behavior During Therapy: WFL for tasks assessed/performed Overall Cognitive Status: Within Functional Limits for tasks assessed  Exercises      General Comments        Pertinent Vitals/Pain Pain Assessment: No/denies pain    Home Living                      Prior Function            PT Goals (current goals can now be found in the care plan section) Progress towards PT goals: Not progressing toward goals - comment    Frequency    Min 2X/week      PT Plan Discharge plan needs to be updated    Co-evaluation              AM-PAC PT "6 Clicks" Daily Activity  Outcome Measure  Difficulty turning over in bed (including adjusting bedclothes, sheets and blankets)?: None Difficulty moving from lying on back to sitting on  the side of the bed? : None Difficulty sitting down on and standing up from a chair with arms (e.g., wheelchair, bedside commode, etc,.)?: A Little Help needed moving to and from a bed to chair (including a wheelchair)?: A Little Help needed walking in hospital room?: A Lot Help needed climbing 3-5 steps with a railing? : Total 6 Click Score: 17    End of Session Equipment Utilized During Treatment: Gait belt Activity Tolerance: Patient limited by fatigue Patient left: in bed;with bed alarm set;with call bell/phone within reach;with family/visitor present Nurse Communication: Mobility status;Other (comment)       Time: 1021-1173 PT Time Calculation (min) (ACUTE ONLY): 16 min  Charges:  $Therapeutic Activity: 8-22 mins                     Chesley Noon, PTA 02/13/18, 11:03 AM

## 2018-02-13 NOTE — Progress Notes (Signed)
Pnt declines eye drops for glaucoma. Both pnt and her husband said that her opthalmologist told hershe no longer needed to take eye drops.

## 2018-02-13 NOTE — Progress Notes (Signed)
Flutter instructions complete, pt and family understands technique and reason for use.

## 2018-02-13 NOTE — Progress Notes (Signed)
[facility] [place] NAME: Autumn [name]    MR#:  [id number]  DATE OF BIRTH:  08-13-1945  SUBJECTIVE:  CHIEF COMPLAINT:   Chief Complaint  Patient presents with  . Shortness of Breath   Continues to have wheezing and weakness.  Feels worse than yesterday.  On 2 L oxygen.  REVIEW OF SYSTEMS:  Review of Systems  Constitutional: Positive for malaise/fatigue. Negative for chills and fever.  HENT: Negative for hearing loss.   Eyes: Negative for blurred vision and double vision.  Respiratory: Positive for shortness of breath and wheezing. Negative for cough.   Cardiovascular: Negative for chest pain and palpitations.  Gastrointestinal: Negative for abdominal pain, constipation, diarrhea, nausea and vomiting.  Genitourinary: Negative for dysuria.  Musculoskeletal: Negative for myalgias.  Neurological: Negative for dizziness, focal weakness, seizures, weakness and headaches.  Psychiatric/Behavioral: Negative for depression.   DRUG ALLERGIES:   Allergies  Allergen Reactions  . Ivp Dye [Iodinated Diagnostic Agents] Hives and Itching    VITALS:  Blood pressure 124/66, pulse 83, temperature 98.5 F (36.9 C), temperature source Oral, resp. rate 20, height 5\' 7"  (1.702 m), weight 55.7 kg, SpO2 98 %.  PHYSICAL EXAMINATION:  Physical Exam  GENERAL:  72 y.o.-year-old thin built patient lying in the bed with no acute distress.  EYES: Pupils equal, round, reactive to light and accommodation. No scleral icterus. Extraocular muscles intact.  HEENT: Head atraumatic, normocephalic. Oropharynx and nasopharynx clear.  NECK:  Supple, no jugular venous distention. No thyroid enlargement, no tenderness.  LUNGS: Bilateral wheezing.  Decreased air entry. CARDIOVASCULAR: S1, S2 normal. No  rubs, or gallops. 2/6 systolic murmur present ABDOMEN: Soft, nontender, nondistended. Bowel sounds present. No organomegaly or mass.  EXTREMITIES: No pedal edema,  cyanosis, or clubbing.  NEUROLOGIC: Cranial nerves II through XII are intact. Muscle strength 5/5 in all extremities. Sensation intact. Gait not checked.  PSYCHIATRIC: The patient is alert and oriented x 3.  Anxious SKIN: No obvious rash, lesion, or ulcer.    LABORATORY PANEL:   CBC Recent Labs  Lab 02/13/18 0459  WBC 17.1*  HGB 10.7*  HCT 36.3  PLT 297   ------------------------------------------------------------------------------------------------------------------  Chemistries  Recent Labs  Lab 02/07/18 0324  02/13/18 0459  NA 145   < > 145  K 3.9   < > 4.6  CL 101   < > 100  CO2 35*   < > 35*  GLUCOSE 126*   < > 131*  BUN 41*   < > 28*  CREATININE 0.73   < > 0.68  CALCIUM 9.2   < > 10.0  MG 2.7*  --   --    < > = values in this interval not displayed.   ------------------------------------------------------------------------------------------------------------------  Cardiac Enzymes No results for input(s): TROPONINI in the last 168 hours. ------------------------------------------------------------------------------------------------------------------  RADIOLOGY:  Dg Chest 2 View  Result Date: 02/12/2018 CLINICAL DATA:  Shortness of breath EXAM: CHEST - 2 VIEW COMPARISON:  02/09/2018 FINDINGS: Cardiac shadow is stable. The lungs are hyperinflated. Stable scoliosis is seen. Scarring is noted in the apices bilaterally without acute focal infiltrate. No new focal abnormality is seen. Multiple calcified lymph nodes are identified in the hila and mediastinum consistent with prior granulomatous disease. IMPRESSION: Chronic apical scarring without acute abnormality. Electronically Signed   By: Inez Catalina M.D.   On: 02/12/2018 15:32    EKG:   Orders placed or performed during the hospital encounter of 02/04/18  . ED  EKG within 10 minutes  . ED EKG within 10 minutes  . EKG 12-Lead  . EKG 12-Lead    ASSESSMENT AND PLAN:   72 year old female with past medical  history significant for chronic respiratory failure secondary to COPD on 2 L home oxygen, pulmonary sarcoidosis, hypertension, glaucoma and osteoporosis presents to hospital secondary to worsening shortness of breath.  1.  Acute on chronic hypoxic respiratory failure-secondary to COPD exacerbation -IV steroids, Antibiotics.  Azithromycin added - Scheduled Nebulizers - Inhalers -Wean O2 as tolerated -Consulted pulmonary.  Appreciate input.  2.  Hypertension-patient on lisinopril  3.  Glaucoma-continue eyedrops  4.  Sarcoidosis-patient on chronic oral prednisone.    5.  DVT prophylaxis-subcutaneous heparin  Husband updated at bedside  All the records are reviewed and case discussed with Care Management/Social Worker Management plans discussed with the patient, family and they are in agreement.  CODE STATUS: DNR  TOTAL TIME TAKING CARE OF THIS PATIENT: 34 minutes.   POSSIBLE D/C IN 1-2 DAYS, DEPENDING ON CLINICAL CONDITION.  Neita Carp M.D on 02/13/2018 at 2:04 PM  Between 7am to 6pm - Pager - [phone]  After 6pm go to [email or web address] - password [email or web address] [facility]  Office  [phone]  CC: Primary care physician; Baxter Hire, MD

## 2018-02-13 NOTE — Progress Notes (Signed)
   CHIEF COMPLAINT:   Chief Complaint  Patient presents with  . Shortness of Breath    Subjective  Remains SOB +Wheezing Resting comfortably   ECHO shows EF 45%  Has COPD  On IV steroids and NEB therapy Feels bad      Review of Systems:  Gen:  Denies  fever, sweats, chills weigh loss  HEENT: Denies blurred vision, double vision, ear pain, eye pain, hearing loss, nose bleeds, sore throat Cardiac:  No dizziness, chest pain or heaviness, chest tightness,edema, No JVD Resp:   +cough -sputum production, +shortness of breath,+wheezing, -hemoptysis,  Gi: Denies swallowing difficulty, stomach pain, nausea or vomiting, diarrhea, constipation, bowel incontinence Gu:  Denies bladder incontinence, burning urine Ext:   Denies Joint pain, stiffness or swelling Skin: Denies  skin rash, easy bruising or bleeding or hives Endoc:  Denies polyuria, polydipsia , polyphagia or weight change Psych:   Denies depression, insomnia or hallucinations  Other:  All other systems negative   Objective   Examination:  General exam: Appears calm and comfortable  Respiratory system: Clear to auscultation. Respiratory effort normal.+wheezing/crackles HEENT: [place]/AT, PERRLA, no thrush, no stridor. Cardiovascular system: S1 & S2 heard, RRR. No JVD, murmurs, rubs, gallops or clicks. No pedal edema. Gastrointestinal system: Abdomen is nondistended, soft and nontender. No organomegaly or masses felt. Normal bowel sounds heard. Central nervous system: Alert and oriented. No focal neurological deficits. Extremities: Symmetric 5 x 5 power. Skin: No rashes, lesions or ulcers Psychiatry: Judgement and insight appear normal. Mood & affect appropriate.   VITALS:  height is 5\' 7"  (1.702 m) and weight is 55.7 kg. Her oral temperature is 98.6 F (37 C). Her blood pressure is 121/69 and her pulse is 78. Her respiration is 18 and oxygen saturation is 97%.   I personally reviewed Labs under Results  section.  Radiology Reports Dg Chest 2 View  Result Date: 02/12/2018 CLINICAL DATA:  Shortness of breath EXAM: CHEST - 2 VIEW COMPARISON:  02/09/2018 FINDINGS: Cardiac shadow is stable. The lungs are hyperinflated. Stable scoliosis is seen. Scarring is noted in the apices bilaterally without acute focal infiltrate. No new focal abnormality is seen. Multiple calcified lymph nodes are identified in the hila and mediastinum consistent with prior granulomatous disease. IMPRESSION: Chronic apical scarring without acute abnormality. Electronically Signed   By: Inez Catalina M.D.   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 All questions answered  Corrin Parker, M.D.  [facility]  Medical Director [place] Director [facility] Cardio-Pulmonary Department

## 2018-02-13 NOTE — NC FL2 (Signed)
[place] LEVEL OF CARE SCREENING TOOL     IDENTIFICATION  Patient Name: Autumn [name] Birthdate: [birth year] Sex: female Admission Date (Current Location): 02/04/2018  [place] and [place] Number:  Engineering geologist and Address:  [facility], [address] [place], [place] [address]      Provider Number: [id number]  Attending Physician Name and Address:  Hillary Bow, MD  Relative Name and Phone Number:  Corinthian Kemler- spouse [phone]    Current Level of Care: Hospital Recommended Level of Care: [facility] Prior Approval Number:    Date Approved/Denied:   PASRR Number: [id number]  Discharge Plan: SNF    Current Diagnoses: Patient Active Problem List   Diagnosis Date Noted  . Protein-calorie malnutrition, severe 02/06/2018  . Acute respiratory failure ([place]) 02/04/2018  . COPD (chronic obstructive pulmonary disease) ([place]) 03/22/2017  . Post herpetic neuralgia 07/30/2016  . Pneumonia 11/17/2015  . Long term current use of systemic steroids 02/14/2015  . Pulmonary hypertension ([place]) 10/05/2013  . 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    Orientation RESPIRATION BLADDER Height & Weight     Self, Time, Place  O2(2 liters ) Incontinent Weight: 122 lb 11.2 oz (55.7 kg) Height:  5\' 7"  (170.2 cm)  BEHAVIORAL SYMPTOMS/MOOD NEUROLOGICAL BOWEL NUTRITION STATUS  (none) (none) Incontinent Diet(Regular)  AMBULATORY STATUS COMMUNICATION OF NEEDS Skin   Extensive Assist Verbally Normal                       Personal Care Assistance Level of Assistance  Bathing, Feeding, Dressing Bathing Assistance: Limited assistance Feeding assistance: Independent Dressing Assistance: Limited assistance     Functional Limitations Info  Sight, Hearing, Speech Sight Info: Adequate Hearing Info: Adequate Speech Info: Adequate    SPECIAL CARE FACTORS FREQUENCY  PT (By licensed PT), OT (By licensed OT)     PT  Frequency: 5 OT Frequency: 5            Contractures Contractures Info: Not present    Additional Factors Info  Code Status, Allergies Code Status Info: DNR Allergies Info:  Ivp Dye Iodinated Diagnostic Agents           Current Medications (02/13/2018):  This is the current hospital active medication list Current Facility-Administered Medications  Medication Dose Route Frequency Provider Last Rate Last Dose  . acetaminophen (TYLENOL) tablet 650 mg  650 mg Oral Q6H PRN Amelia Jo, MD   650 mg at 02/08/18 1145   Or  . acetaminophen (TYLENOL) suppository 650 mg  650 mg Rectal Q6H PRN Amelia Jo, MD      . ALPRAZolam Duanne Moron) tablet 0.25 mg  0.25 mg Oral Q8H PRN Cassandria Santee, MD   0.25 mg at 02/12/18 1715  . arformoterol (BROVANA) nebulizer solution 15 mcg  15 mcg Nebulization BID Flora Lipps, MD   15 mcg at 02/13/18 1523  . atenolol (TENORMIN) tablet 50 mg  50 mg Oral Daily Sudini, Srikar, MD   50 mg at 02/13/18 1012  . atorvastatin (LIPITOR) tablet 10 mg  10 mg Oral q1800 Amelia Jo, MD   10 mg at 02/13/18 1622  . azithromycin (ZITHROMAX) tablet 250 mg  250 mg Oral Daily Flora Lipps, MD   250 mg at 02/13/18 1314  . bisacodyl (DULCOLAX) EC tablet 5 mg  5 mg Oral Daily PRN Amelia Jo, MD      . budesonide (PULMICORT) nebulizer solution 0.5 mg  0.5 mg Nebulization BID Awilda Bill, NP  0.5 mg at 02/13/18 0720  . calcium-vitamin D (OSCAL WITH D) 500-200 MG-UNIT per tablet 1 tablet  1 tablet Oral BID WC Amelia Jo, MD   1 tablet at 02/13/18 1315  . docusate sodium (COLACE) capsule 100 mg  100 mg Oral BID Amelia Jo, MD   100 mg at 02/12/18 2154  . famotidine (PEPCID) tablet 20 mg  20 mg Oral QHS Cassandria Santee, MD   20 mg at 02/12/18 2153  . feeding supplement (ENSURE ENLIVE) (ENSURE ENLIVE) liquid 237 mL  237 mL Oral BID BM Samaan, Maged, MD   237 mL at 02/08/18 1445  . fentaNYL (SUBLIMAZE) injection 12.5 mcg  12.5 mcg Intravenous Q2H PRN Tukov-Yual, Magdalene S,  NP      . heparin injection 5,000 Units  5,000 Units Subcutaneous Q8H Amelia Jo, MD   5,000 Units at 02/13/18 1316  . hydrALAZINE (APRESOLINE) injection 10 mg  10 mg Intravenous Q4H PRN Tukov-Yual, Magdalene S, NP   10 mg at 02/12/18 1529  . HYDROcodone-acetaminophen (NORCO/VICODIN) 5-325 MG per tablet 1-2 tablet  1-2 tablet Oral Q4H PRN Amelia Jo, MD   2 tablet at 02/13/18 1622  . ipratropium-albuterol (DUONEB) 0.5-2.5 (3) MG/3ML nebulizer solution 3 mL  3 mL Nebulization Q4H Sudini, Srikar, MD   3 mL at 02/13/18 1523  . lidocaine (XYLOCAINE) 5 % ointment 1 application  1 application Topical QID PRN Amelia Jo, MD      . lisinopril (PRINIVIL,ZESTRIL) tablet 20 mg  20 mg Oral Daily Samaan, Maged, MD   20 mg at 02/13/18 1012  . MEDLINE mouth rinse  15 mL Mouth Rinse BID Soyla Murphy, Maged, MD   15 mL at 02/13/18 1022  . methylPREDNISolone sodium succinate (SOLU-MEDROL) 125 mg/2 mL injection 60 mg  60 mg Intravenous Q12H Gladstone Lighter, MD   60 mg at 02/13/18 1012  . metoprolol tartrate (LOPRESSOR) injection 5 mg  5 mg Intravenous Q4H PRN Cassandria Santee, MD   5 mg at 02/07/18 0744  . mirtazapine (REMERON) tablet 7.5 mg  7.5 mg Oral QHS Amelia Jo, MD   7.5 mg at 02/12/18 2149  . montelukast (SINGULAIR) tablet 10 mg  10 mg Oral QHS Cassandria Santee, MD   10 mg at 02/12/18 2151  . multivitamin with minerals tablet 1 tablet  1 tablet Oral Daily Cassandria Santee, MD   1 tablet at 02/13/18 1012  . omega-3 acid ethyl esters (LOVAZA) capsule 1 g  1 g Oral Daily Amelia Jo, MD   1 g at 02/13/18 1012  . ondansetron (ZOFRAN) tablet 4 mg  4 mg Oral Q6H PRN Amelia Jo, MD       Or  . ondansetron [facility]) injection 4 mg  4 mg Intravenous Q6H PRN Amelia Jo, MD   4 mg at 02/05/18 1704  . pregabalin (LYRICA) capsule 100 mg  100 mg Oral TID Amelia Jo, MD   100 mg at 02/13/18 1621  . sertraline (ZOLOFT) tablet 25 mg  25 mg Oral Daily Amelia Jo, MD   25 mg at 02/13/18 1012  . tiZANidine  (ZANAFLEX) tablet 4 mg  4 mg Oral BID PRN Cassandria Santee, MD      . traZODone (DESYREL) tablet 25 mg  25 mg Oral QHS PRN Amelia Jo, MD   25 mg at 02/10/18 2117     Discharge Medications: Please see discharge summary for a list of discharge medications.  Relevant Imaging Results:  Relevant Lab Results:   Additional Information SSN: [id number]  Rock Sobol  Louretta Shorten, [place]

## 2018-02-14 DIAGNOSIS — Z515 Encounter for palliative care: Secondary | ICD-10-CM

## 2018-02-14 MED ORDER — PREGABALIN 50 MG PO CAPS
100.0000 mg | ORAL_CAPSULE | Freq: Every day | ORAL | Status: DC
  Administered 2018-02-15: 11:00:00 100 mg via ORAL
  Filled 2018-02-14: qty 2

## 2018-02-14 MED ORDER — TIZANIDINE HCL 2 MG PO TABS
2.0000 mg | ORAL_TABLET | Freq: Two times a day (BID) | ORAL | Status: DC | PRN
  Filled 2018-02-14: qty 1

## 2018-02-14 MED ORDER — BOOST / RESOURCE BREEZE PO LIQD CUSTOM
1.0000 | Freq: Three times a day (TID) | ORAL | Status: DC
  Administered 2018-02-14 – 2018-02-15 (×4): 1 via ORAL

## 2018-02-14 NOTE — Clinical Social Work Note (Signed)
Clinical Social Work Assessment  Patient Details  Name: Autumn [name] MRN: [id number] Date of Birth: June 03, 1945  Date of referral:  02/14/18               Reason for consult:  Facility Placement                Permission sought to share information with:  Case Manager, Customer service manager, Family Supports Permission granted to share information::  Yes, Verbal Permission Granted  Name::      SNF  Agency::   [facility]   Relationship::     Contact Information:     Housing/Transportation Living arrangements for the past 2 months:  [place] of Information:  Patient Patient Interpreter Needed:  None Criminal Activity/Legal Involvement Pertinent to Current Situation/Hospitalization:  No - Comment as needed Significant Relationships:  Spouse Lives with:  Spouse Do you feel safe going back to the place where you live?  Yes Need for family participation in patient care:  Yes (Comment)  Care giving concerns:  Patient lives with husband in [place]    Social Worker assessment / plan:  CSW consulted for facility placement. CSW met with patient and husband Kaysee Hergert at bedside. CSW introduced self and explained role. Patient states that she lives with her husband but understands she needs rehab. CSW explained that PT has recommended SNF and patient and husband are in agreement. Patient states that her preference would be [facility] and second choice would be [facility]. CSW initiated bed search and will give offers once received. CSW will follow for discharge planning.   Employment status:  Retired Nurse, adult PT Recommendations:  [place] / Referral to community resources:  [place]  Patient/Family's Response to care:  Patient and husband thanked CSW for assistance   Patient/Family's Understanding of and Emotional Response to Diagnosis, Current Treatment, and Prognosis:   Patient and husband understand patient's diagnoses and treatment.   Emotional Assessment Appearance:  Appears stated age Attitude/Demeanor/Rapport:    Affect (typically observed):  Accepting, Restless Orientation:  Oriented to Self, Oriented to Place, Oriented to  Time, Oriented to Situation Alcohol / Substance use:  Not Applicable Psych involvement (Current and /or in the community):  No (Comment)  Discharge Needs  Concerns to be addressed:  Discharge Planning Concerns Readmission within the last 30 days:  No Current discharge risk:  Dependent with Mobility Barriers to Discharge:  Continued Medical Work up   [employer], [place] 02/14/2018, 9:34 AM

## 2018-02-14 NOTE — Progress Notes (Signed)
Physical Therapy Treatment Patient Details Name: Autumn [name] MRN: [id number] DOB: [birth year] Today's Date: 02/14/2018    History of Present Illness  72 y.o. female with a known history of pulmonary sarcoidosis, COPD, chronic respiratory failure on continuous 2 L oxygen per nasal cannula at home.  She was having increased weezing, shortness of breath, weakness and ultimately needed to come to the ED and spent nearly a week in the CCU.    PT Comments    Patient agreeable to PT at start of session, family and visitor at bedside. No complaints of pain, just complaints of SOB. Patient able to perform sit <> stand for total of 3 times during session, CGA-minAx1 for last transfer. 88min seated rest breaks in between transfers. Ambulated in room with spO2 >90%, RW, CGA, O2 via [place] ~33ft. Patient very fatigued at end of session and in bed with nursing at bedside. The patient would benefit from continued skilled PT to address limitations in mobility, ambulation, endurance, activity tolerance, as well as to optimize education, safety, and independence.      Follow Up Recommendations  SNF     Equipment Recommendations  Rolling walker with 5" wheels    Recommendations for Other Services       Precautions / Restrictions Precautions Precautions: Fall Restrictions Weight Bearing Restrictions: No Other Position/Activity Restrictions: HR and spO2    Mobility  Bed Mobility Overal bed mobility: Independent                Transfers Overall transfer level: Needs assistance Equipment used: Rolling walker (2 wheeled) Transfers: Sit to/from Stand Sit to Stand: Min guard;Min assist         General transfer comment: Patient needed minAx1 after 3rd sit to stand during session, husband eager to assist.  Ambulation/Gait Ambulation/Gait assistance: Min guard Gait Distance (Feet): 10 Feet Assistive device: Rolling walker (2 wheeled)   Gait velocity: decreased   General Gait Details:  increased trunk flexion   Stairs             Wheelchair Mobility    Modified Rankin (Stroke Patients Only)       Balance Overall balance assessment: Needs assistance Sitting-balance support: Feet supported Sitting balance-Leahy Scale: Fair     Standing balance support: Bilateral upper extremity supported Standing balance-Leahy Scale: Poor Standing balance comment: forward lean today with heavy reliance on walker                            Cognition Arousal/Alertness: Awake/alert Behavior During Therapy: WFL for tasks assessed/performed Overall Cognitive Status: Within Functional Limits for tasks assessed                                        Exercises Other Exercises Other Exercises: total of 3 sit to stands during session. Able to tolerate standing ~30secs for washing. Performed standing marching with RW after seated rest break ~20secs.     General Comments        Pertinent Vitals/Pain Pain Assessment: No/denies pain    Home Living                      Prior Function            PT Goals (current goals can now be found in the care plan section) Progress towards PT goals: Not progressing toward  goals - comment(significant difficulty with breathing this session. Unable to tolerate ambulation >43ft.)    Frequency    Min 2X/week      PT Plan Current plan remains appropriate    Co-evaluation              AM-PAC PT "6 Clicks" Daily Activity  Outcome Measure  Difficulty turning over in bed (including adjusting bedclothes, sheets and blankets)?: None Difficulty moving from lying on back to sitting on the side of the bed? : None Difficulty sitting down on and standing up from a chair with arms (e.g., wheelchair, bedside commode, etc,.)?: A Little Help needed moving to and from a bed to chair (including a wheelchair)?: A Little Help needed walking in hospital room?: A Lot Help needed climbing 3-5 steps with a  railing? : Total 6 Click Score: 17    End of Session Equipment Utilized During Treatment: Gait belt Activity Tolerance: Patient limited by fatigue Patient left: in bed;with nursing/sitter in room;with family/visitor present Nurse Communication: Mobility status;Other (comment) PT Visit Diagnosis: Difficulty in walking, not elsewhere classified (R26.2);Muscle weakness (generalized) (M62.81)     Time: 1540-1606 PT Time Calculation (min) (ACUTE ONLY): 26 min  Charges:  $Therapeutic Exercise: 8-22 mins $Therapeutic Activity: 8-22 mins                     Lieutenant Diego PT, DPT 4:14 PM,02/14/18 [phone]

## 2018-02-14 NOTE — Progress Notes (Signed)
Nutrition Follow-up  DOCUMENTATION CODES:   Severe malnutrition in context of chronic illness, Underweight  INTERVENTION:  Will discontinue Ensure Enlive as patient is not drinking them.  Provide Boost Breeze po TID, each supplement provides 250 kcal and 9 grams of protein.  Provide Magic cup TID with meals, each supplement provides 290 kcal and 9 grams of protein.  Continue daily MVI.  NUTRITION DIAGNOSIS:   Severe Malnutrition related to chronic illness(sarcoidosis, COPD, hx ovarian cancer) as evidenced by severe fat depletion, severe muscle depletion.  Ongoing.  GOAL:   Patient will meet greater than or equal to 90% of their needs  Progressing.  MONITOR:   PO intake, Supplement acceptance, Weight trends, Labs, I & O's  REASON FOR ASSESSMENT:   Malnutrition Screening Tool, Consult Assessment of nutrition requirement/status  ASSESSMENT:   72 year old female with PMHx of sarcoidosis, COPD, glaucoma, HTN, hx ovarian cancer, OP who is admitted with acute exacerbation of sarcoidosis, acute exacerbation of COPD, pulmonary hypertension.  Appetite and intake still decreased. Meal completion is now at 40-50%. Patient is no longer drinking Ensure. Abdomen is soft. Patient had two bowel movements yesterday.  Medications reviewed and include: azithromycin, Oscal with D BID, Colace 100 mg BID, famotidine, Solu-Medrol 60 mg Q12hrs IV, Remeron 7.5 mg QHS, MVI daily, Lovaza 1 gram daily, sertraline.  Labs reviewed.  I/O: 400 mL UOP recorded yesterday  Weight trend: 55.7 kg on 11/5; -1.1 kg from 10/31  Diet Order:   Diet Order            Diet regular Room service appropriate? Yes; Fluid consistency: Thin  Diet effective now              EDUCATION NEEDS:   Education needs have been addressed  Skin:  Skin Assessment: Skin Integrity Issues:(ulceration right outer ankle)  Last BM:  02/13/2018 - smear type 6  Height:   Ht Readings from Last 1 Encounters:  02/08/18 5'  7" (1.702 m)    Weight:   Wt Readings from Last 1 Encounters:  02/14/18 55.7 kg    Ideal Body Weight:  63.6 kg  BMI:  Body mass index is 19.22 kg/m.  Estimated Nutritional Needs:   Kcal:  1400-1600  Protein:  70-80 grams  Fluid:  1.4-1.6 L/day  Willey Blade, MS, RD, LDN Office: [phone] Pager: [phone] After Hours/Weekend Pager: [phone]

## 2018-02-14 NOTE — Consult Note (Signed)
[facility]  [phone] [phone]   Name: Autumn [name] Date: 02/14/2018 MRN: [id number]  DOB: 1945-09-12  Patient Care Team: Baxter Hire, MD as PCP - General (Internal Medicine)    REASON FOR CONSULTATION: Palliative Care consult requested for this 72 y.o. female with multiple medical problems including O2 dependent COPD, pulmonary sarcoidosis, who was admitted on 02/04/2018 with acute on chronic respiratory failure secondary COPD exacerbation.  Patient has had slow improvement in respiratory status with IV steroids and antibiotics.  Patient is being discharged to the SNF for rehab.  Palliative care was asked to help establish goals of care.   SOCIAL HISTORY:    Patient is married.  Lives at home with her husband.  ADVANCE DIRECTIVES:  Unknown  CODE STATUS: DNR  PAST MEDICAL HISTORY: Past Medical History:  Diagnosis Date  . Allergy   . COPD (chronic obstructive pulmonary disease) ([place])   . Glaucoma   . Hypertension   . Osteoporosis   . Ovarian cancer ([place])   . Sarcoid     PAST SURGICAL HISTORY:  Past Surgical History:  Procedure Laterality Date  . ABDOMINAL HYSTERECTOMY    . BREAST BIOPSY Right     HEMATOLOGY/ONCOLOGY HISTORY:   No history exists.    ALLERGIES:  is allergic to ivp dye [iodinated diagnostic agents].  MEDICATIONS:  Current Facility-Administered Medications  Medication Dose Route Frequency Provider Last Rate Last Dose  . acetaminophen (TYLENOL) tablet 650 mg  650 mg Oral Q6H PRN Amelia Jo, MD   650 mg at 02/08/18 1145   Or  . acetaminophen (TYLENOL) suppository 650 mg  650 mg Rectal Q6H PRN Amelia Jo, MD      . ALPRAZolam Duanne Moron) tablet 0.25 mg  0.25 mg Oral Q8H PRN Cassandria Santee, MD   0.25 mg at 02/12/18 1715  . arformoterol (BROVANA) nebulizer solution 15 mcg  15 mcg Nebulization BID Flora Lipps, MD   15 mcg at 02/14/18 0754  . atenolol (TENORMIN) tablet 50 mg   50 mg Oral Daily Hillary Bow, MD   50 mg at 02/14/18 0950  . atorvastatin (LIPITOR) tablet 10 mg  10 mg Oral q1800 Amelia Jo, MD   10 mg at 02/13/18 1622  . azithromycin (ZITHROMAX) tablet 250 mg  250 mg Oral Daily Flora Lipps, MD   250 mg at 02/14/18 0950  . bisacodyl (DULCOLAX) EC tablet 5 mg  5 mg Oral Daily PRN Amelia Jo, MD      . budesonide (PULMICORT) nebulizer solution 0.5 mg  0.5 mg Nebulization BID Awilda Bill, NP   0.5 mg at 02/14/18 0740  . calcium-vitamin D (OSCAL WITH D) 500-200 MG-UNIT per tablet 1 tablet  1 tablet Oral BID WC Amelia Jo, MD   1 tablet at 02/14/18 1225  . docusate sodium (COLACE) capsule 100 mg  100 mg Oral BID Amelia Jo, MD   100 mg at 02/12/18 2154  . famotidine (PEPCID) tablet 20 mg  20 mg Oral QHS Cassandria Santee, MD   20 mg at 02/13/18 2101  . feeding supplement (BOOST / RESOURCE BREEZE) liquid 1 Container  1 Container Oral TID BM Hillary Bow, MD   1 Container at 02/14/18 1401  . heparin injection 5,000 Units  5,000 Units Subcutaneous Q8H Amelia Jo, MD   5,000 Units at 02/14/18 1359  . hydrALAZINE (APRESOLINE) injection 10 mg  10 mg Intravenous Q4H PRN Tukov-Yual, Magdalene S, NP   10 mg at 02/12/18  1529  . HYDROcodone-acetaminophen (NORCO/VICODIN) 5-325 MG per tablet 1-2 tablet  1-2 tablet Oral Q4H PRN Amelia Jo, MD   2 tablet at 02/13/18 1622  . ipratropium-albuterol (DUONEB) 0.5-2.5 (3) MG/3ML nebulizer solution 3 mL  3 mL Nebulization Q4H Sudini, Srikar, MD   3 mL at 02/14/18 1117  . lidocaine (XYLOCAINE) 5 % ointment 1 application  1 application Topical QID PRN Amelia Jo, MD      . lisinopril (PRINIVIL,ZESTRIL) tablet 20 mg  20 mg Oral Daily Cassandria Santee, MD   20 mg at 02/14/18 0951  . MEDLINE mouth rinse  15 mL Mouth Rinse BID Cassandria Santee, MD   15 mL at 02/14/18 0951  . methylPREDNISolone sodium succinate (SOLU-MEDROL) 125 mg/2 mL injection 60 mg  60 mg Intravenous Q12H Gladstone Lighter, MD   60 mg at 02/14/18 0951    . metoprolol tartrate (LOPRESSOR) injection 5 mg  5 mg Intravenous Q4H PRN Cassandria Santee, MD   5 mg at 02/07/18 0744  . mirtazapine (REMERON) tablet 7.5 mg  7.5 mg Oral QHS Amelia Jo, MD   7.5 mg at 02/13/18 2101  . montelukast (SINGULAIR) tablet 10 mg  10 mg Oral QHS Cassandria Santee, MD   10 mg at 02/13/18 2101  . multivitamin with minerals tablet 1 tablet  1 tablet Oral Daily Cassandria Santee, MD   1 tablet at 02/14/18 0950  . omega-3 acid ethyl esters (LOVAZA) capsule 1 g  1 g Oral Daily Amelia Jo, MD   1 g at 02/14/18 0950  . ondansetron (ZOFRAN) tablet 4 mg  4 mg Oral Q6H PRN Amelia Jo, MD       Or  . ondansetron [facility]) injection 4 mg  4 mg Intravenous Q6H PRN Amelia Jo, MD   4 mg at 02/05/18 1704  . [START ON 02/15/2018] pregabalin (LYRICA) capsule 100 mg  100 mg Oral Daily Sudini, Srikar, MD      . sertraline (ZOLOFT) tablet 25 mg  25 mg Oral Daily Amelia Jo, MD   25 mg at 02/14/18 0951  . tiZANidine (ZANAFLEX) tablet 2 mg  2 mg Oral BID PRN Hillary Bow, MD      . traZODone (DESYREL) tablet 25 mg  25 mg Oral QHS PRN Amelia Jo, MD   25 mg at 02/10/18 2117    VITAL SIGNS: BP (!) 110/59 (BP Location: Left Arm)   Pulse 72   Temp 98.3 F (36.8 C) (Oral)   Resp 20   Ht '5\' 7"'  (1.702 m)   Wt 122 lb 11.2 oz (55.7 kg)   SpO2 100%   BMI 19.22 kg/m  Filed Weights   02/10/18 0445 02/12/18 0500 02/14/18 0358  Weight: 120 lb 3.2 oz (54.5 kg) 122 lb 11.2 oz (55.7 kg) 122 lb 11.2 oz (55.7 kg)    Estimated body mass index is 19.22 kg/m as calculated from the following:   Height as of this encounter: '5\' 7"'  (1.702 m).   Weight as of this encounter: 122 lb 11.2 oz (55.7 kg).  LABS: CBC:    Component Value Date/Time   WBC 17.1 (H) 02/13/2018 0459   HGB 10.7 (L) 02/13/2018 0459   HGB 9.3 (L) 05/20/2014 0457   HCT 36.3 02/13/2018 0459   HCT 29.8 (L) 05/20/2014 0457   PLT 297 02/13/2018 0459   PLT 401 05/20/2014 0457   MCV 89.0 02/13/2018 0459   MCV 82 05/20/2014  0457   NEUTROABS 12.8 (H) 02/09/2018 0552   NEUTROABS 25.6 (H) 05/20/2014  0457   LYMPHSABS 0.4 (L) 02/09/2018 0552   LYMPHSABS 0.3 (L) 05/20/2014 0457   MONOABS 1.0 02/09/2018 0552   MONOABS 0.9 05/20/2014 0457   EOSABS 0.0 02/09/2018 0552   EOSABS 0.0 05/20/2014 0457   BASOSABS 0.0 02/09/2018 0552   BASOSABS 0.1 05/20/2014 0457   Comprehensive Metabolic Panel:    Component Value Date/Time   NA 145 02/13/2018 0459   NA 138 05/19/2014 0453   K 4.6 02/13/2018 0459   K 4.0 05/19/2014 0453   CL 100 02/13/2018 0459   CL 97 (L) 05/19/2014 0453   CO2 35 (H) 02/13/2018 0459   CO2 39 (H) 05/19/2014 0453   BUN 28 (H) 02/13/2018 0459   BUN 20 (H) 05/19/2014 0453   CREATININE 0.68 02/13/2018 0459   CREATININE 0.80 05/19/2014 0453   GLUCOSE 131 (H) 02/13/2018 0459   GLUCOSE 124 (H) 05/19/2014 0453   CALCIUM 10.0 02/13/2018 0459   CALCIUM 9.6 05/19/2014 0453   AST 21 03/29/2015 1121   AST 34 05/18/2014 1401   ALT 20 03/29/2015 1121   ALT 42 05/18/2014 1401   ALKPHOS 46 03/29/2015 1121   ALKPHOS 64 05/18/2014 1401   BILITOT 0.7 03/29/2015 1121   BILITOT 0.3 05/18/2014 1401   PROT 7.9 03/29/2015 1121   PROT 8.0 05/18/2014 1401   ALBUMIN 4.4 03/29/2015 1121   ALBUMIN 3.1 (L) 05/18/2014 1401    RADIOGRAPHIC STUDIES: Dg Chest 2 View  Result Date: 02/12/2018 CLINICAL DATA:  Shortness of breath EXAM: CHEST - 2 VIEW COMPARISON:  02/09/2018 FINDINGS: Cardiac shadow is stable. The lungs are hyperinflated. Stable scoliosis is seen. Scarring is noted in the apices bilaterally without acute focal infiltrate. No new focal abnormality is seen. Multiple calcified lymph nodes are identified in the hila and mediastinum consistent with prior granulomatous disease. IMPRESSION: Chronic apical scarring without acute abnormality. Electronically Signed   By: Inez Catalina M.D.   On: 02/12/2018 15:32   Dg Chest 2 View  Result Date: 02/04/2018 CLINICAL DATA:  Increasing shortness of breath over the last  2 weeks. No cough, fever or chest pain. EXAM: CHEST - 2 VIEW COMPARISON:  CT, 07/20/2017.  Chest radiograph, 11/19/2015. FINDINGS: Cardiac silhouette is normal in size. No mediastinal or hilar masses. Chronic pleuroparenchymal scarring in the upper lobes, left greater than right. There are associated calcifications in the left upper lobe. Mild scarring at the lung bases. No evidence of pneumonia or pulmonary edema. No pleural effusion or pneumothorax. Skeletal structures are demineralized. There is a scoliosis of the thoracolumbar spine. IMPRESSION: 1. No acute cardiopulmonary disease. 2. Significant areas of chronic lung scarring, greatest in the left upper lobe, stable from the prior studies. Electronically Signed   By: Lajean Manes M.D.   On: 02/04/2018 15:21   Ct Chest Wo Contrast  Result Date: 02/04/2018 CLINICAL DATA:  72 year old female with shortness of breath. History of sarcoid. EXAM: CT CHEST WITHOUT CONTRAST TECHNIQUE: Multidetector CT imaging of the chest was performed following the standard protocol without IV contrast. COMPARISON:  Chest radiograph dated 02/04/2018 and CT dated 07/20/2017 FINDINGS: Evaluation of this exam is limited in the absence of intravenous contrast. Cardiovascular: Mild cardiomegaly. No pericardial effusion. There is multi vessel coronary vascular calcification. There is moderate atherosclerotic calcification of the thoracic aorta. There is mild dilatation of the main pulmonary trunk suggestive of pulmonary hypertension. Clinical correlation is recommended. Mediastinum/Nodes: Calcified hilar and mediastinal granuloma. No adenopathy. The esophagus is grossly unremarkable. No mediastinal fluid collection. Lungs/Pleura: Bilateral upper lobe  predominant scarring, left apical cystic changes and associated traction bronchiectasis similar to prior CT. Right apical pleural based soft tissue density appears stable and similar to the prior CT. Scattered nodularity primarily in the  right lower lobe also similar to prior CT. The constellation of findings are consistent with history of sarcoid. No new consolidative changes. There is no pleural effusion or pneumothorax. Upper Abdomen: Calcified splenic granuloma. Musculoskeletal: Scoliosis. No acute osseous pathology. IMPRESSION: 1. No acute intrathoracic pathology. 2. Pulmonary and mediastinal manifestation of sarcoid similar to prior study. Electronically Signed   By: Anner Crete M.D.   On: 02/04/2018 23:37   Dg Chest Port 1 View  Result Date: 02/09/2018 CLINICAL DATA:  Respiratory distress.  Follow-up exam. EXAM: PORTABLE CHEST 1 VIEW COMPARISON:  02/07/2018 and multiple prior studies. FINDINGS: No change in bilateral upper lobe and right mid lung scarring. No new lung opacities. No evidence pneumonia or pulmonary edema. No pleural effusion or pneumothorax. IMPRESSION: 1. No acute findings. 2. Stable chronic changes including extensive upper lobe scarring. Electronically Signed   By: Lajean Manes M.D.   On: 02/09/2018 07:11   Dg Chest Port 1 View  Result Date: 02/07/2018 CLINICAL DATA:  Respiratory failure.  History of sarcoidosis. EXAM: PORTABLE CHEST 1 VIEW COMPARISON:  Chest radiograph and chest CT February 04, 2018 FINDINGS: There is scarring in the left upper lobe with asymmetric apical pleural thickening, stable. There are calcifications in this area as well. There is a moderate degree of scarring in the right upper lobe. There is mild bibasilar scarring. There is no edema or consolidation. The cardiac silhouette is normal. There are calcified hilar and mediastinal lymph nodes. No adenopathy by size criteria evident. No bone lesions. Scoliosis stable. IMPRESSION: Stable upper lobe scarring, more severe on the left than on the right with asymmetric left apical pleural thickening, stable. Areas of calcification in the left upper lobe stable. Milder scarring right base. Calcified mediastinal and hilar lymph nodes. These are  changes consistent with residua from prior sarcoidosis. No edema or consolidation. Stable cardiac silhouette. Stable scoliosis. Electronically Signed   By: Lowella Grip III M.D.   On: 02/07/2018 07:47    PERFORMANCE STATUS (ECOG) : 3 - Symptomatic, >50% confined to bed  Review of Systems As noted above. Otherwise, a complete review of systems is negative.  Physical Exam General: NAD, frail appearing, thin Cardiovascular: regular rate and rhythm Pulmonary: Coarse anterior fields Abdomen: soft, nontender, + bowel sounds GU: no suprapubic tenderness Extremities: no edema, no joint deformities Skin: no rashes Neurological: Weakness but otherwise nonfocal  IMPRESSION: I met with patient and discussed her goals.  Patient reports slow improvement and feels slightly better today than she did yesterday.  She confirms plan for rehab after discharge.  Patient has a bed at [facility].  Prior to this hospitalization, patient says that she was fairly independent at home.  She confirms being O2 dependent but did not feel that her breathing limited her activities of daily living.  We discussed her clinical trajectory and future medical decision making.  We discussed the exacerbation cycle of chronic respiratory disease.  Patient verbalized an understanding that it is possible she might not return to her previous baseline.  However, she states her goals are consistent with continued treatment and rehabilitation.  I would recommend that patient is followed at rehab by palliative care.  In the event of decline or if patient fails to return to her previous baseline, patient would likely be clinically appropriate  for hospice care in the home.  DNR confirmed  PLAN: Continue supportive care Plan to discharge to rehab when medically ready Recommend palliative care follow-up at time of discharge   Time Total: 30 minutes  Visit consisted of counseling and education dealing with the complex and  emotionally intense issues of symptom management and palliative care in the setting of serious and potentially life-threatening illness.Greater than 50%  of this time was spent counseling and coordinating care related to the above assessment and plan.  Signed by: Altha Harm, PhD, DNP, NP-C, [facility] [phone] (Work Cell)

## 2018-02-14 NOTE — Plan of Care (Signed)

## 2018-02-14 NOTE — Progress Notes (Signed)
[place] [facility] [place] NAME: Autumn [name]    MR#:  [id number]  DATE OF BIRTH:  [birth year]  SUBJECTIVE:  CHIEF COMPLAINT:   Chief Complaint  Patient presents with  . Shortness of Breath   Weak and wheezing. On 2 L O2  REVIEW OF SYSTEMS:  Review of Systems  Constitutional: Positive for malaise/fatigue. Negative for chills and fever.  HENT: Negative for hearing loss.   Eyes: Negative for blurred vision and double vision.  Respiratory: Positive for shortness of breath and wheezing. Negative for cough.   Cardiovascular: Negative for chest pain and palpitations.  Gastrointestinal: Negative for abdominal pain, constipation, diarrhea, nausea and vomiting.  Genitourinary: Negative for dysuria.  Musculoskeletal: Negative for myalgias.  Neurological: Negative for dizziness, focal weakness, seizures, weakness and headaches.  Psychiatric/Behavioral: Negative for depression.   DRUG ALLERGIES:   Allergies  Allergen Reactions  . Ivp Dye [Iodinated Diagnostic Agents] Hives and Itching    VITALS:  Blood pressure (!) 110/59, pulse 72, temperature 98.3 F (36.8 C), temperature source Oral, resp. rate 20, height 5\' 7"  (1.702 m), weight 55.7 kg, SpO2 100 %.  PHYSICAL EXAMINATION:  Physical Exam  GENERAL:  72 y.o.-year-old thin built patient lying in the bed with no acute distress.  EYES: Pupils equal, round, reactive to light and accommodation. No scleral icterus. Extraocular muscles intact.  HEENT: Head atraumatic, normocephalic. Oropharynx and nasopharynx clear.  NECK:  Supple, no jugular venous distention. No thyroid enlargement, no tenderness.  LUNGS: Bilateral wheezing.  Decreased air entry. CARDIOVASCULAR: S1, S2 normal. No  rubs, or gallops. 2/6 systolic murmur present ABDOMEN: Soft, nontender, nondistended. Bowel sounds present. No organomegaly or mass.  EXTREMITIES: No pedal edema, cyanosis, or clubbing.  NEUROLOGIC: Cranial nerves II  through XII are intact. Muscle strength 5/5 in all extremities. Sensation intact. Gait not checked.  PSYCHIATRIC: The patient is alert and oriented x 3.  Anxious SKIN: No obvious rash, lesion, or ulcer.   LABORATORY PANEL:   CBC Recent Labs  Lab 02/13/18 0459  WBC 17.1*  HGB 10.7*  HCT 36.3  PLT 297   ------------------------------------------------------------------------------------------------------------------  Chemistries  Recent Labs  Lab 02/13/18 0459  NA 145  K 4.6  CL 100  CO2 35*  GLUCOSE 131*  BUN 28*  CREATININE 0.68  CALCIUM 10.0   ------------------------------------------------------------------------------------------------------------------  Cardiac Enzymes No results for input(s): TROPONINI in the last 168 hours. ------------------------------------------------------------------------------------------------------------------  RADIOLOGY:  Dg Chest 2 View  Result Date: 02/12/2018 CLINICAL DATA:  Shortness of breath EXAM: CHEST - 2 VIEW COMPARISON:  02/09/2018 FINDINGS: Cardiac shadow is stable. The lungs are hyperinflated. Stable scoliosis is seen. Scarring is noted in the apices bilaterally without acute focal infiltrate. No new focal abnormality is seen. Multiple calcified lymph nodes are identified in the hila and mediastinum consistent with prior granulomatous disease. IMPRESSION: Chronic apical scarring without acute abnormality. Electronically Signed   By: Inez Catalina M.D.   On: 02/12/2018 15:32    EKG:   Orders placed or performed during the hospital encounter of 02/04/18  . ED EKG within 10 minutes  . ED EKG within 10 minutes  . EKG 12-Lead  . EKG 12-Lead    ASSESSMENT AND PLAN:   72 year old female with past medical history significant for chronic respiratory failure secondary to COPD on 2 L home oxygen, pulmonary sarcoidosis, hypertension, glaucoma and osteoporosis presents to hospital secondary to worsening shortness of breath.  1.   Acute on chronic hypoxic respiratory failure-secondary to  COPD exacerbation -IV steroids, Azithromycin - Scheduled Nebulizers - Inhalers -Wean O2 as tolerated - Discussed with Dr. Lanney Gins  2.  Hypertension-patient on lisinopril  3.  Glaucoma-continue eyedrops  4.  Sarcoidosis-patient on chronic oral prednisone and now on IV steroids  5.  DVT prophylaxis-subcutaneous heparin  Husband updated at bedside  All the records are reviewed and case discussed with Care Management/Social Worker Management plans discussed with the patient, family and they are in agreement.  CODE STATUS: DNR  TOTAL TIME TAKING CARE OF THIS PATIENT: 30 minutes.   POSSIBLE D/C IN 1-2 DAYS, DEPENDING ON CLINICAL CONDITION.  Leia Alf Tonnie Stillman M.D on 02/14/2018 at 2:47 PM  Between 7am to 6pm - Pager - [phone]  After 6pm go to [email or web address] - password [email or web address] [facility]  Office  [phone]  CC: Primary care physician; Baxter Hire, MD

## 2018-02-14 NOTE — Progress Notes (Signed)
New referral for outpatient Palliative to follow at [facility] received from [place]. No discharge date at this time. Patient information faxed to referral. Flo Shanks RN, BSN, [facility] [place], hospital liaison [phone]

## 2018-02-14 NOTE — Consult Note (Signed)
Pulmonary Medicine Consultation           Date: 02/14/2018,   MRN# [id number] Autumn [name] 1945/11/14 Code Status:     Code Status Orders  (From admission, onward)         Start     Ordered   02/09/18 1130  Do not attempt resuscitation (DNR)  Continuous    Question Answer Comment  In the event of cardiac or respiratory ARREST Do not call a "code blue"   In the event of cardiac or respiratory ARREST Do not perform Intubation, CPR, defibrillation or ACLS   In the event of cardiac or respiratory ARREST Use medication by any route, position, wound care, and other measures to relive pain and suffering. May use oxygen, suction and manual treatment of airway obstruction as needed for comfort.      02/09/18 1129        Code Status History    Date Active Date Inactive Code Status Order ID Comments User Context   02/05/2018 0050 02/09/2018 1129 Full Code [id number]  Amelia Jo, MD Inpatient   11/17/2015 1929 11/21/2015 1558 Full Code [id number]  Epifanio Lesches, MD ED           AdmissionWeight: 54.4 kg                 CurrentWeight: 55.7 kg      CHIEF COMPLAINT:    COPD & Sarcoid s/p MICU    HISTORY OF PRESENT ILLNESS    a 72 y.o. female with a known history of pulmonary sarcoidosis,chronic respiratory failure on continuous 2 L oxygen per nasal cannula at home.  She has a smoking history of only 2 packs in her lifetime.  She has scoliosis which is significant.  She had been initially diagnosed with sarcoid several years ago by [facility] pul[facility] in [place] but has been followed by Dr Vella Kohler since then due to proximity to residence.  She has been on gabapentin, lyrica, buprenophrine, zanaflex for chronic pain syndrome which has worsenened after shingles 4 years ago.  She relates that despite being compliant with nebulizers she became short of breath and was asked to increase her prednisone but eventually continued to get worsening sob and decided to come in to ED when  she was treated for URI and sent home.  She then returned and was found to be in acute hypoxic respiratory failure and was upgraded to micu for empiric CAP treatment.  Since then she has improved clincially and is now down to her usual home O2 dose at 2L/min  PAST MEDICAL HISTORY   Past Medical History:  Diagnosis Date  . Allergy   . COPD (chronic obstructive pulmonary disease) ([place])   . Glaucoma   . Hypertension   . Osteoporosis   . Ovarian cancer ([place])   . Sarcoid      SURGICAL HISTORY   Past Surgical History:  Procedure Laterality Date  . ABDOMINAL HYSTERECTOMY    . BREAST BIOPSY Right      FAMILY HISTORY   Family History  Problem Relation Age of Onset  . Breast cancer Mother 46  . Cancer Mother   . Hypertension Father      SOCIAL HISTORY   Social History   Tobacco Use  . Smoking status: Never Smoker  . Smokeless tobacco: Never Used  Substance Use Topics  . Alcohol use: No    Frequency: Never  . Drug use: No     MEDICATIONS  Home Medication: reviewed today ambuloatory meds   Current Medication:  Current Facility-Administered Medications:  .  acetaminophen (TYLENOL) tablet 650 mg, 650 mg, Oral, Q6H PRN, 650 mg at 02/08/18 1145 **OR** acetaminophen (TYLENOL) suppository 650 mg, 650 mg, Rectal, Q6H PRN, Amelia Jo, MD .  ALPRAZolam Duanne Moron) tablet 0.25 mg, 0.25 mg, Oral, Q8H PRN, Soyla Murphy, Maged, MD, 0.25 mg at 02/12/18 1715 .  arformoterol (BROVANA) nebulizer solution 15 mcg, 15 mcg, Nebulization, BID, Flora Lipps, MD, 15 mcg at 02/14/18 0754 .  atenolol (TENORMIN) tablet 50 mg, 50 mg, Oral, Daily, Sudini, Srikar, MD, 50 mg at 02/14/18 0950 .  atorvastatin (LIPITOR) tablet 10 mg, 10 mg, Oral, q1800, Amelia Jo, MD, 10 mg at 02/13/18 1622 .  azithromycin (ZITHROMAX) tablet 250 mg, 250 mg, Oral, Daily, Kasa, Kurian, MD, 250 mg at 02/14/18 0950 .  bisacodyl (DULCOLAX) EC tablet 5 mg, 5 mg, Oral, Daily PRN, Amelia Jo, MD .  budesonide (PULMICORT)  nebulizer solution 0.5 mg, 0.5 mg, Nebulization, BID, Awilda Bill, NP, 0.5 mg at 02/14/18 0740 .  calcium-vitamin D (OSCAL WITH D) 500-200 MG-UNIT per tablet 1 tablet, 1 tablet, Oral, BID WC, Amelia Jo, MD, 1 tablet at 02/14/18 1225 .  docusate sodium (COLACE) capsule 100 mg, 100 mg, Oral, BID, Amelia Jo, MD, 100 mg at 02/12/18 2154 .  famotidine (PEPCID) tablet 20 mg, 20 mg, Oral, QHS, Samaan, Maged, MD, 20 mg at 02/13/18 2101 .  feeding supplement (BOOST / RESOURCE BREEZE) liquid 1 Container, 1 Container, Oral, TID BM, Hillary Bow, MD, 1 Container at 02/14/18 1401 .  fentaNYL (SUBLIMAZE) injection 12.5 mcg, 12.5 mcg, Intravenous, Q2H PRN, Tukov-Yual, Magdalene S, NP .  heparin injection 5,000 Units, 5,000 Units, Subcutaneous, Q8H, Amelia Jo, MD, 5,000 Units at 02/14/18 1359 .  hydrALAZINE (APRESOLINE) injection 10 mg, 10 mg, Intravenous, Q4H PRN, Tukov-Yual, Magdalene S, NP, 10 mg at 02/12/18 1529 .  HYDROcodone-acetaminophen (NORCO/VICODIN) 5-325 MG per tablet 1-2 tablet, 1-2 tablet, Oral, Q4H PRN, Amelia Jo, MD, 2 tablet at 02/13/18 1622 .  ipratropium-albuterol (DUONEB) 0.5-2.5 (3) MG/3ML nebulizer solution 3 mL, 3 mL, Nebulization, Q4H, Sudini, Srikar, MD, 3 mL at 02/14/18 1117 .  lidocaine (XYLOCAINE) 5 % ointment 1 application, 1 application, Topical, QID PRN, Amelia Jo, MD .  lisinopril (PRINIVIL,ZESTRIL) tablet 20 mg, 20 mg, Oral, Daily, Samaan, Maged, MD, 20 mg at 02/14/18 0951 .  MEDLINE mouth rinse, 15 mL, Mouth Rinse, BID, Samaan, Maged, MD, 15 mL at 02/14/18 0951 .  methylPREDNISolone sodium succinate (SOLU-MEDROL) 125 mg/2 mL injection 60 mg, 60 mg, Intravenous, Q12H, Gladstone Lighter, MD, 60 mg at 02/14/18 0951 .  metoprolol tartrate (LOPRESSOR) injection 5 mg, 5 mg, Intravenous, Q4H PRN, Soyla Murphy, Maged, MD, 5 mg at 02/07/18 0744 .  mirtazapine (REMERON) tablet 7.5 mg, 7.5 mg, Oral, QHS, Amelia Jo, MD, 7.5 mg at 02/13/18 2101 .  montelukast  (SINGULAIR) tablet 10 mg, 10 mg, Oral, QHS, Samaan, Maged, MD, 10 mg at 02/13/18 2101 .  multivitamin with minerals tablet 1 tablet, 1 tablet, Oral, Daily, Soyla Murphy, Maged, MD, 1 tablet at 02/14/18 0950 .  omega-3 acid ethyl esters (LOVAZA) capsule 1 g, 1 g, Oral, Daily, Amelia Jo, MD, 1 g at 02/14/18 0950 .  ondansetron (ZOFRAN) tablet 4 mg, 4 mg, Oral, Q6H PRN **OR** ondansetron (ZOFRAN) injection 4 mg, 4 mg, Intravenous, Q6H PRN, Amelia Jo, MD, 4 mg at 02/05/18 1704 .  pregabalin (LYRICA) capsule 100 mg, 100 mg, Oral, TID, Amelia Jo, MD, 100 mg at 02/14/18  77 .  sertraline (ZOLOFT) tablet 25 mg, 25 mg, Oral, Daily, Amelia Jo, MD, 25 mg at 02/14/18 0951 .  tiZANidine (ZANAFLEX) tablet 4 mg, 4 mg, Oral, BID PRN, Soyla Murphy, Maged, MD .  traZODone (DESYREL) tablet 25 mg, 25 mg, Oral, QHS PRN, Amelia Jo, MD, 25 mg at 02/10/18 2117    ALLERGIES   Ivp dye [iodinated diagnostic agents]     REVIEW OF SYSTEMS    Review of Systems:  Gen:  Denies  fever, sweats, chills weigh loss  HEENT: Denies blurred vision, double vision, ear pain, eye pain, hearing loss, nose bleeds, sore throat Cardiac:  No dizziness, chest pain or heaviness, chest tightness,edema Resp:   Denies cough or sputum porduction, shortness of breath,wheezing, hemoptysis,  Gi: Denies swallowing difficulty, stomach pain, nausea or vomiting, diarrhea, constipation, bowel incontinence Gu:  Denies bladder incontinence, burning urine Ext:   Denies Joint pain, stiffness or swelling Skin: Denies  skin rash, easy bruising or bleeding or hives Endoc:  Denies polyuria, polydipsia , polyphagia or weight change Psych:   Denies depression, insomnia or hallucinations   Other:  All other systems negative   VS: BP (!) 110/59 (BP Location: Left Arm)   Pulse 72   Temp 98.3 F (36.8 C) (Oral)   Resp 20   Ht 5\' 7"  (1.702 m)   Wt 55.7 kg   SpO2 100%   BMI 19.22 kg/m      PHYSICAL EXAM  Physical Examination:    GENERAL:NAD, no fevers, chills, no weakness no fatigue HEAD: Normocephalic, atraumatic.  EYES: Pupils equal, round, reactive to light. Extraocular muscles intact. No scleral icterus.  MOUTH: Moist mucosal membrane. Dentition intact. No abscess noted.  EAR, NOSE, THROAT: Clear without exudates. No external lesions.  NECK: Supple. No thyromegaly. No nodules. No JVD.  PULMONARY: Diffuse coarse rhonchi bilaterally  CARDIOVASCULAR: S1 and S2. Regular rate and rhythm. No murmurs, rubs, or gallops. No edema. Pedal pulses 2+ bilaterally.  GASTROINTESTINAL: Soft, nontender, nondistended. No masses. Positive bowel sounds. No hepatosplenomegaly.  MUSCULOSKELETAL: No swelling, clubbing, or edema. Range of motion full in all extremities.  NEUROLOGIC: Cranial nerves II through XII are intact. No gross focal neurological deficits. Sensation intact. Reflexes intact.  SKIN: No ulceration, lesions, rashes, or cyanosis. Skin warm and dry. Turgor intact.  PSYCHIATRIC: Mood, affect within normal limits. The patient is awake, alert and oriented x 3. Insight, judgment intact.        Ref Range & Units 1d ago 3d ago 5d ago 6d ago  WBC 4.0 - 10.5 K/uL 17.1High   13.0High   14.3High   14.8High    RBC 3.87 - 5.11 MIL/uL 4.08  3.78Low   4.20  4.25   Hemoglobin 12.0 - 15.0 g/dL 10.7Low   9.9Low   10.9Low   11.2Low    HCT 36.0 - 46.0 % 36.3  34.0Low   37.6  38.5   MCV 80.0 - 100.0 fL 89.0  89.9  89.5  90.6   MCH 26.0 - 34.0 pg 26.2  26.2  26.0  26.4   MCHC 30.0 - 36.0 g/dL 29.5Low   29.1Low   29.0Low   29.1Low    RDW 11.5 - 15.5 % 13.7  13.4  13.5  13.6   Platelets 150 - 400 K/uL 297  217  240  236   nRBC 0.0 - 0.2 % 0.0  0.0 CM 0.0  0.0 CM  Comment: Performed at [facility], [address] [place], [place] [address]  Neutrophils Relative %    89    Basophils Absolute    0.0    Immature Granulocytes    1    Abs Immature Granulocytes    0.09High  CM   Neutro Abs    12.8High     Lymphocytes  Relative    3    Lymphs Abs    0.4Low     Monocytes Relative    7      Ref Range & Units 1d ago 3d ago 6d ago 7d ago  Sodium 135 - 145 mmol/L 145  146High   148High   145   Potassium 3.5 - 5.1 mmol/L 4.6  3.7  4.7  3.9   Chloride 98 - 111 mmol/L 100  102  103  101   CO2 22 - 32 mmol/L 35High   36High   36High   35High    Glucose, Bld 70 - 99 mg/dL 131High   119High   131High   126High    BUN 8 - 23 mg/dL 28High   28High   39High   41High    Creatinine, Ser 0.44 - 1.00 mg/dL 0.68  0.59  0.73  0.73   Calcium 8.9 - 10.3 mg/dL 10.0  8.9  8.9  9.2   GFR calc non Af Amer >60 mL/min >60  >60  >60  >60   GFR calc Af Amer >60 mL/min >60  >60 CM >60 CM >60 CM  Comment: (NOTE)        IMAGING    Dg Chest 2 View  Result Date: 02/12/2018 CLINICAL DATA:  Shortness of breath EXAM: CHEST - 2 VIEW COMPARISON:  02/09/2018 FINDINGS: Cardiac shadow is stable. The lungs are hyperinflated. Stable scoliosis is seen. Scarring is noted in the apices bilaterally without acute focal infiltrate. No new focal abnormality is seen. Multiple calcified lymph nodes are identified in the hila and mediastinum consistent with prior granulomatous disease. IMPRESSION: Chronic apical scarring without acute abnormality. Electronically Signed   By: Inez Catalina M.D.   On: 02/12/2018 15:32   Dg Chest 2 View  Result Date: 02/04/2018 CLINICAL DATA:  Increasing shortness of breath over the last 2 weeks. No cough, fever or chest pain. EXAM: CHEST - 2 VIEW COMPARISON:  CT, 07/20/2017.  Chest radiograph, 11/19/2015. FINDINGS: Cardiac silhouette is normal in size. No mediastinal or hilar masses. Chronic pleuroparenchymal scarring in the upper lobes, left greater than right. There are associated calcifications in the left upper lobe. Mild scarring at the lung bases. No evidence of pneumonia or pulmonary edema. No pleural effusion or pneumothorax. Skeletal structures are demineralized. There is a scoliosis of the thoracolumbar spine.  IMPRESSION: 1. No acute cardiopulmonary disease. 2. Significant areas of chronic lung scarring, greatest in the left upper lobe, stable from the prior studies. Electronically Signed   By: Lajean Manes M.D.   On: 02/04/2018 15:21   Ct Chest Wo Contrast  Result Date: 02/04/2018 CLINICAL DATA:  72 year old female with shortness of breath. History of sarcoid. EXAM: CT CHEST WITHOUT CONTRAST TECHNIQUE: Multidetector CT imaging of the chest was performed following the standard protocol without IV contrast. COMPARISON:  Chest radiograph dated 02/04/2018 and CT dated 07/20/2017 FINDINGS: Evaluation of this exam is limited in the absence of intravenous contrast. Cardiovascular: Mild cardiomegaly. No pericardial effusion. There is multi vessel coronary vascular calcification. There is moderate atherosclerotic calcification of the thoracic aorta. There is mild dilatation of the main pulmonary trunk suggestive of pulmonary hypertension. Clinical correlation is  recommended. Mediastinum/Nodes: Calcified hilar and mediastinal granuloma. No adenopathy. The esophagus is grossly unremarkable. No mediastinal fluid collection. Lungs/Pleura: Bilateral upper lobe predominant scarring, left apical cystic changes and associated traction bronchiectasis similar to prior CT. Right apical pleural based soft tissue density appears stable and similar to the prior CT. Scattered nodularity primarily in the right lower lobe also similar to prior CT. The constellation of findings are consistent with history of sarcoid. No new consolidative changes. There is no pleural effusion or pneumothorax. Upper Abdomen: Calcified splenic granuloma. Musculoskeletal: Scoliosis. No acute osseous pathology. IMPRESSION: 1. No acute intrathoracic pathology. 2. Pulmonary and mediastinal manifestation of sarcoid similar to prior study. Electronically Signed   By: Anner Crete M.D.   On: 02/04/2018 23:37   Dg Chest Port 1 View  Result Date:  02/09/2018 CLINICAL DATA:  Respiratory distress.  Follow-up exam. EXAM: PORTABLE CHEST 1 VIEW COMPARISON:  02/07/2018 and multiple prior studies. FINDINGS: No change in bilateral upper lobe and right mid lung scarring. No new lung opacities. No evidence pneumonia or pulmonary edema. No pleural effusion or pneumothorax. IMPRESSION: 1. No acute findings. 2. Stable chronic changes including extensive upper lobe scarring. Electronically Signed   By: Lajean Manes M.D.   On: 02/09/2018 07:11   Dg Chest Port 1 View  Result Date: 02/07/2018 CLINICAL DATA:  Respiratory failure.  History of sarcoidosis. EXAM: PORTABLE CHEST 1 VIEW COMPARISON:  Chest radiograph and chest CT February 04, 2018 FINDINGS: There is scarring in the left upper lobe with asymmetric apical pleural thickening, stable. There are calcifications in this area as well. There is a moderate degree of scarring in the right upper lobe. There is mild bibasilar scarring. There is no edema or consolidation. The cardiac silhouette is normal. There are calcified hilar and mediastinal lymph nodes. No adenopathy by size criteria evident. No bone lesions. Scoliosis stable. IMPRESSION: Stable upper lobe scarring, more severe on the left than on the right with asymmetric left apical pleural thickening, stable. Areas of calcification in the left upper lobe stable. Milder scarring right base. Calcified mediastinal and hilar lymph nodes. These are changes consistent with residua from prior sarcoidosis. No edema or consolidation. Stable cardiac silhouette. Stable scoliosis. Electronically Signed   By: Lowella Grip III M.D.   On: 02/07/2018 07:47    Age:        72 Height:     172.7 cm Weight:     52.2 kg BSA:        1.57 m^2 Pt. Status: Room:       IC04A   PERFORMING   [place]  ATTENDING    [place], [place]    Tyrone Schimke, Angela  REFERRING    Amelia Jo  SONOGRAPHER  Charmayne Sheer,  RDCS  cc:  ------------------------------------------------------------------- LV EF: 45%  ------------------------------------------------------------------- Indications:      Dyspnea.  ------------------------------------------------------------------- History:   PMH:  Ovarian cancer.  Chronic obstructive pulmonary disease.  Risk factors:  Hypertension.  ------------------------------------------------------------------- Study Conclusions  - Left ventricle: There was mild focal basal hypertrophy of the   septum. Systolic function was mildly reduced. The estimated   ejection fraction was 45%. Diffuse hypokinesis. Doppler   parameters are consistent with abnormal left ventricular   relaxation (grade 1 diastolic dysfunction). - Aortic valve: There was moderate regurgitation. - Mitral valve: There was mild regurgitation. - Left atrium: The atrium was mildly dilated. - Pulmonary arteries: PA peak pressure: 56 mm Hg (S).  Impressions:  - The right ventricular systolic pressure was increased consistent   with severe pulmonary hypertension. Mild LV systolic dysfunction   and diastollic dysfunction, with mild to moderate AR, mild MR   with mild MVP.  ASSESSMENT/PLAN    Acute on Chronic hypoxemic and hypercarbic respiratory failure   Likely multifactorial including:    - COPD , cystic sarcoidosis, HFrEF    - currently on brovana, pulmicort, zithromax and solumedrol bid 60mg      -remains on 2L [place]    - recommend finishing empiric CAP tx as patient is relatively immunocompromised from chronic steroids and is unlikey to mount a good response to detect infection clinically    - Aggressive bronchopulmonary hygiene with incentive spirometry and albuterol nebulizer q6h   - physical therapy to help combat atelectasis    -decrease any sedating medications as possible   Pt followed by Dr Vella Kohler pulmonary on outpatient  Patient/Family are satisfied with Plan of action and  management. All questions answered     Claudette Stapler MD Division of Pulmonary and Critical Care Medicine 2:23 PM 02/14/2018

## 2018-02-14 NOTE — Clinical Social Work Note (Signed)
CSW spoke with Magda Paganini at [facility]. Per Magda Paganini, they can accept patient for short term rehab. Magda Paganini will begin [facility] authorization. CSW notified patient of above. CSW will continue to follow for discharge planning.   [place], [place]

## 2018-02-15 DIAGNOSIS — J9621 Acute and chronic respiratory failure with hypoxia: Secondary | ICD-10-CM

## 2018-02-15 DIAGNOSIS — J9610 Chronic respiratory failure, unspecified whether with hypoxia or hypercapnia: Secondary | ICD-10-CM

## 2018-02-15 MED ORDER — MIRTAZAPINE 7.5 MG PO TABS: 7.5000 mg | ORAL_TABLET | Freq: Every day | ORAL | 2 refills | Status: DC

## 2018-02-15 MED ORDER — PREDNISONE 10 MG PO TABS: 10.0000 mg | ORAL_TABLET | Freq: Every day | ORAL | Status: DC

## 2018-02-15 MED ORDER — ALPRAZOLAM 0.25 MG PO TABS: 0.2500 mg | ORAL_TABLET | Freq: Two times a day (BID) | ORAL | Status: DC

## 2018-02-15 MED ORDER — ALPRAZOLAM 0.25 MG PO TABS: 0.2500 mg | ORAL_TABLET | Freq: Two times a day (BID) | ORAL | 0 refills | Status: DC

## 2018-02-15 MED ORDER — AZITHROMYCIN 250 MG PO TABS: 250.0000 mg | ORAL_TABLET | Freq: Every day | ORAL | 0 refills | Status: AC

## 2018-02-15 MED ORDER — ARFORMOTEROL TARTRATE 15 MCG/2ML IN NEBU: 15.0000 ug | INHALATION_SOLUTION | Freq: Two times a day (BID) | RESPIRATORY_TRACT | 1 refills | Status: DC

## 2018-02-15 MED ORDER — PREDNISONE 10 MG (21) PO TBPK: ORAL_TABLET | ORAL | 0 refills | Status: DC

## 2018-02-15 MED ORDER — IPRATROPIUM-ALBUTEROL 0.5-2.5 (3) MG/3ML IN SOLN: 3.0000 mL | Freq: Four times a day (QID) | RESPIRATORY_TRACT | 0 refills | Status: DC | PRN

## 2018-02-15 NOTE — Clinical Social Work Note (Signed)
Patient is medically ready for discharge today. CSW notified patient and husband at bedside of discharge today to [facility]. Patient and husband in agreement. CSW notified Magda Paganini at [facility] of discharge today. Patient will be transported by EMS. RN to call report and call for transport.   [place], [place]

## 2018-02-15 NOTE — Progress Notes (Signed)
Handoff report called to Fanny Skates, Therapist, sports at [facility]

## 2018-02-15 NOTE — Progress Notes (Signed)
Pt discharged to [facility] via EMS. Husband with pt. Handoff report given to EMS.

## 2018-02-15 NOTE — Progress Notes (Signed)
[facility]  [phone] [phone]   Name: Autumn [name] Date: 02/15/2018 MRN: [id number]  DOB: 07/11/1945  Patient Care Team: Baxter Hire, MD as PCP - General (Internal Medicine)    REASON FOR CONSULTATION: Palliative Care consult requested for this 72 y.o. female with multiple medical problems including O2 dependent COPD, pulmonary sarcoidosis, who was admitted on 02/04/2018 with acute on chronic respiratory failure secondary COPD exacerbation.  Patient has had slow improvement in respiratory status with IV steroids and antibiotics.  Patient is being discharged to the SNF for rehab.  Palliative care was asked to help establish goals of care.   CODE STATUS: DNR  PAST MEDICAL HISTORY: Past Medical History:  Diagnosis Date  . Allergy   . COPD (chronic obstructive pulmonary disease) ([place])   . Glaucoma   . Hypertension   . Osteoporosis   . Ovarian cancer ([place])   . Sarcoid     PAST SURGICAL HISTORY:  Past Surgical History:  Procedure Laterality Date  . ABDOMINAL HYSTERECTOMY    . BREAST BIOPSY Right     HEMATOLOGY/ONCOLOGY HISTORY:   No history exists.    ALLERGIES:  is allergic to ivp dye [iodinated diagnostic agents].  MEDICATIONS:  Current Facility-Administered Medications  Medication Dose Route Frequency Provider Last Rate Last Dose  . acetaminophen (TYLENOL) tablet 650 mg  650 mg Oral Q6H PRN Amelia Jo, MD   650 mg at 02/08/18 1145   Or  . acetaminophen (TYLENOL) suppository 650 mg  650 mg Rectal Q6H PRN Amelia Jo, MD      . ALPRAZolam Duanne Moron) tablet 0.25 mg  0.25 mg Oral Q8H PRN Cassandria Santee, MD   0.25 mg at 02/12/18 1715  . arformoterol (BROVANA) nebulizer solution 15 mcg  15 mcg Nebulization BID Flora Lipps, MD   15 mcg at 02/15/18 0729  . atenolol (TENORMIN) tablet 50 mg  50 mg Oral Daily Hillary Bow, MD   50 mg at 02/14/18 0950  . atorvastatin (LIPITOR) tablet 10 mg  10 mg  Oral q1800 Amelia Jo, MD   10 mg at 02/14/18 1756  . azithromycin (ZITHROMAX) tablet 250 mg  250 mg Oral Daily Flora Lipps, MD   250 mg at 02/14/18 0950  . bisacodyl (DULCOLAX) EC tablet 5 mg  5 mg Oral Daily PRN Amelia Jo, MD      . budesonide (PULMICORT) nebulizer solution 0.5 mg  0.5 mg Nebulization BID Awilda Bill, NP   0.5 mg at 02/15/18 0729  . calcium-vitamin D (OSCAL WITH D) 500-200 MG-UNIT per tablet 1 tablet  1 tablet Oral BID WC Amelia Jo, MD   1 tablet at 02/14/18 1225  . docusate sodium (COLACE) capsule 100 mg  100 mg Oral BID Amelia Jo, MD   100 mg at 02/12/18 2154  . famotidine (PEPCID) tablet 20 mg  20 mg Oral QHS Cassandria Santee, MD   20 mg at 02/14/18 2133  . feeding supplement (BOOST / RESOURCE BREEZE) liquid 1 Container  1 Container Oral TID BM Hillary Bow, MD   1 Container at 02/14/18 2003  . heparin injection 5,000 Units  5,000 Units Subcutaneous Q8H Amelia Jo, MD   5,000 Units at 02/14/18 2133  . hydrALAZINE (APRESOLINE) injection 10 mg  10 mg Intravenous Q4H PRN Tukov-Yual, Magdalene S, NP   10 mg at 02/12/18 1529  . HYDROcodone-acetaminophen (NORCO/VICODIN) 5-325 MG per tablet 1-2 tablet  1-2 tablet Oral Q4H PRN Amelia Jo, MD  2 tablet at 02/13/18 1622  . ipratropium-albuterol (DUONEB) 0.5-2.5 (3) MG/3ML nebulizer solution 3 mL  3 mL Nebulization Q4H Sudini, Alveta Heimlich, MD   3 mL at 02/15/18 0729  . lidocaine (XYLOCAINE) 5 % ointment 1 application  1 application Topical QID PRN Amelia Jo, MD      . lisinopril (PRINIVIL,ZESTRIL) tablet 20 mg  20 mg Oral Daily Cassandria Santee, MD   20 mg at 02/14/18 0951  . MEDLINE mouth rinse  15 mL Mouth Rinse BID Cassandria Santee, MD   15 mL at 02/14/18 2134  . methylPREDNISolone sodium succinate (SOLU-MEDROL) 125 mg/2 mL injection 60 mg  60 mg Intravenous Q12H Gladstone Lighter, MD   60 mg at 02/14/18 2001  . metoprolol tartrate (LOPRESSOR) injection 5 mg  5 mg Intravenous Q4H PRN Cassandria Santee, MD   5 mg at  02/07/18 0744  . mirtazapine (REMERON) tablet 7.5 mg  7.5 mg Oral QHS Amelia Jo, MD   7.5 mg at 02/14/18 2136  . montelukast (SINGULAIR) tablet 10 mg  10 mg Oral QHS Cassandria Santee, MD   10 mg at 02/14/18 2133  . multivitamin with minerals tablet 1 tablet  1 tablet Oral Daily Cassandria Santee, MD   1 tablet at 02/14/18 0950  . omega-3 acid ethyl esters (LOVAZA) capsule 1 g  1 g Oral Daily Amelia Jo, MD   1 g at 02/14/18 0950  . ondansetron (ZOFRAN) tablet 4 mg  4 mg Oral Q6H PRN Amelia Jo, MD       Or  . ondansetron [facility]) injection 4 mg  4 mg Intravenous Q6H PRN Amelia Jo, MD   4 mg at 02/05/18 1704  . pregabalin (LYRICA) capsule 100 mg  100 mg Oral Daily Sudini, Srikar, MD      . sertraline (ZOLOFT) tablet 25 mg  25 mg Oral Daily Amelia Jo, MD   25 mg at 02/14/18 0951  . tiZANidine (ZANAFLEX) tablet 2 mg  2 mg Oral BID PRN Hillary Bow, MD      . traZODone (DESYREL) tablet 25 mg  25 mg Oral QHS PRN Amelia Jo, MD   25 mg at 02/14/18 2134    VITAL SIGNS: BP 119/63 (BP Location: Left Arm)   Pulse 91   Temp 98.6 F (37 C) (Oral)   Resp 16   Ht 5' 7" (1.702 m)   Wt 122 lb 11.2 oz (55.7 kg)   SpO2 100%   BMI 19.22 kg/m  Filed Weights   02/10/18 0445 02/12/18 0500 02/14/18 0358  Weight: 120 lb 3.2 oz (54.5 kg) 122 lb 11.2 oz (55.7 kg) 122 lb 11.2 oz (55.7 kg)    Estimated body mass index is 19.22 kg/m as calculated from the following:   Height as of this encounter: 5' 7" (1.702 m).   Weight as of this encounter: 122 lb 11.2 oz (55.7 kg).  LABS: CBC:    Component Value Date/Time   WBC 17.1 (H) 02/13/2018 0459   HGB 10.7 (L) 02/13/2018 0459   HGB 9.3 (L) 05/20/2014 0457   HCT 36.3 02/13/2018 0459   HCT 29.8 (L) 05/20/2014 0457   PLT 297 02/13/2018 0459   PLT 401 05/20/2014 0457   MCV 89.0 02/13/2018 0459   MCV 82 05/20/2014 0457   NEUTROABS 12.8 (H) 02/09/2018 0552   NEUTROABS 25.6 (H) 05/20/2014 0457   LYMPHSABS 0.4 (L) 02/09/2018 0552   LYMPHSABS  0.3 (L) 05/20/2014 0457   MONOABS 1.0 02/09/2018 0552   MONOABS 0.9 05/20/2014 0457  EOSABS 0.0 02/09/2018 0552   EOSABS 0.0 05/20/2014 0457   BASOSABS 0.0 02/09/2018 0552   BASOSABS 0.1 05/20/2014 0457   Comprehensive Metabolic Panel:    Component Value Date/Time   NA 145 02/13/2018 0459   NA 138 05/19/2014 0453   K 4.6 02/13/2018 0459   K 4.0 05/19/2014 0453   CL 100 02/13/2018 0459   CL 97 (L) 05/19/2014 0453   CO2 35 (H) 02/13/2018 0459   CO2 39 (H) 05/19/2014 0453   BUN 28 (H) 02/13/2018 0459   BUN 20 (H) 05/19/2014 0453   CREATININE 0.68 02/13/2018 0459   CREATININE 0.80 05/19/2014 0453   GLUCOSE 131 (H) 02/13/2018 0459   GLUCOSE 124 (H) 05/19/2014 0453   CALCIUM 10.0 02/13/2018 0459   CALCIUM 9.6 05/19/2014 0453   AST 21 03/29/2015 1121   AST 34 05/18/2014 1401   ALT 20 03/29/2015 1121   ALT 42 05/18/2014 1401   ALKPHOS 46 03/29/2015 1121   ALKPHOS 64 05/18/2014 1401   BILITOT 0.7 03/29/2015 1121   BILITOT 0.3 05/18/2014 1401   PROT 7.9 03/29/2015 1121   PROT 8.0 05/18/2014 1401   ALBUMIN 4.4 03/29/2015 1121   ALBUMIN 3.1 (L) 05/18/2014 1401    RADIOGRAPHIC STUDIES: Dg Chest 2 View  Result Date: 02/12/2018 CLINICAL DATA:  Shortness of breath EXAM: CHEST - 2 VIEW COMPARISON:  02/09/2018 FINDINGS: Cardiac shadow is stable. The lungs are hyperinflated. Stable scoliosis is seen. Scarring is noted in the apices bilaterally without acute focal infiltrate. No new focal abnormality is seen. Multiple calcified lymph nodes are identified in the hila and mediastinum consistent with prior granulomatous disease. IMPRESSION: Chronic apical scarring without acute abnormality. Electronically Signed   By: Inez Catalina M.D.   On: 02/12/2018 15:32   Dg Chest 2 View  Result Date: 02/04/2018 CLINICAL DATA:  Increasing shortness of breath over the last 2 weeks. No cough, fever or chest pain. EXAM: CHEST - 2 VIEW COMPARISON:  CT, 07/20/2017.  Chest radiograph, 11/19/2015. FINDINGS:  Cardiac silhouette is normal in size. No mediastinal or hilar masses. Chronic pleuroparenchymal scarring in the upper lobes, left greater than right. There are associated calcifications in the left upper lobe. Mild scarring at the lung bases. No evidence of pneumonia or pulmonary edema. No pleural effusion or pneumothorax. Skeletal structures are demineralized. There is a scoliosis of the thoracolumbar spine. IMPRESSION: 1. No acute cardiopulmonary disease. 2. Significant areas of chronic lung scarring, greatest in the left upper lobe, stable from the prior studies. Electronically Signed   By: Lajean Manes M.D.   On: 02/04/2018 15:21   Ct Chest Wo Contrast  Result Date: 02/04/2018 CLINICAL DATA:  73 year old female with shortness of breath. History of sarcoid. EXAM: CT CHEST WITHOUT CONTRAST TECHNIQUE: Multidetector CT imaging of the chest was performed following the standard protocol without IV contrast. COMPARISON:  Chest radiograph dated 02/04/2018 and CT dated 07/20/2017 FINDINGS: Evaluation of this exam is limited in the absence of intravenous contrast. Cardiovascular: Mild cardiomegaly. No pericardial effusion. There is multi vessel coronary vascular calcification. There is moderate atherosclerotic calcification of the thoracic aorta. There is mild dilatation of the main pulmonary trunk suggestive of pulmonary hypertension. Clinical correlation is recommended. Mediastinum/Nodes: Calcified hilar and mediastinal granuloma. No adenopathy. The esophagus is grossly unremarkable. No mediastinal fluid collection. Lungs/Pleura: Bilateral upper lobe predominant scarring, left apical cystic changes and associated traction bronchiectasis similar to prior CT. Right apical pleural based soft tissue density appears stable and similar to the prior CT.  Scattered nodularity primarily in the right lower lobe also similar to prior CT. The constellation of findings are consistent with history of sarcoid. No new  consolidative changes. There is no pleural effusion or pneumothorax. Upper Abdomen: Calcified splenic granuloma. Musculoskeletal: Scoliosis. No acute osseous pathology. IMPRESSION: 1. No acute intrathoracic pathology. 2. Pulmonary and mediastinal manifestation of sarcoid similar to prior study. Electronically Signed   By: Arash  Radparvar M.D.   On: 02/04/2018 23:37   Dg Chest Port 1 View  Result Date: 02/09/2018 CLINICAL DATA:  Respiratory distress.  Follow-up exam. EXAM: PORTABLE CHEST 1 VIEW COMPARISON:  02/07/2018 and multiple prior studies. FINDINGS: No change in bilateral upper lobe and right mid lung scarring. No new lung opacities. No evidence pneumonia or pulmonary edema. No pleural effusion or pneumothorax. IMPRESSION: 1. No acute findings. 2. Stable chronic changes including extensive upper lobe scarring. Electronically Signed   By: David  Ormond M.D.   On: 02/09/2018 07:11   Dg Chest Port 1 View  Result Date: 02/07/2018 CLINICAL DATA:  Respiratory failure.  History of sarcoidosis. EXAM: PORTABLE CHEST 1 VIEW COMPARISON:  Chest radiograph and chest CT February 04, 2018 FINDINGS: There is scarring in the left upper lobe with asymmetric apical pleural thickening, stable. There are calcifications in this area as well. There is a moderate degree of scarring in the right upper lobe. There is mild bibasilar scarring. There is no edema or consolidation. The cardiac silhouette is normal. There are calcified hilar and mediastinal lymph nodes. No adenopathy by size criteria evident. No bone lesions. Scoliosis stable. IMPRESSION: Stable upper lobe scarring, more severe on the left than on the right with asymmetric left apical pleural thickening, stable. Areas of calcification in the left upper lobe stable. Milder scarring right base. Calcified mediastinal and hilar lymph nodes. These are changes consistent with residua from prior sarcoidosis. No edema or consolidation. Stable cardiac silhouette. Stable  scoliosis. Electronically Signed   By: William  Woodruff III M.D.   On: 02/07/2018 07:47    PERFORMANCE STATUS (ECOG) : 3 - Symptomatic, >50% confined to bed  Review of Systems As noted above. Otherwise, a complete review of systems is negative.  Physical Exam General: NAD, frail appearing, thin Cardiovascular: regular rate and rhythm Pulmonary: Poor air movement without wheeze, on O2  Abdomen: soft, nontender, + bowel sounds Extremities: no edema Skin: no rashes Neurological: Weakness but otherwise nonfocal  IMPRESSION: I met with patient and her husband.  Patient reports breathing about the same today.  She still has intermittent episodes of acute on chronic dyspnea, worse on exertion.  Patient endorses anxiety exacerbated by dyspnea.  She was taking alprazolam at home at bedtime.  Alprazolam is ordered inpatient but patient has not received a dose in several days.  Will schedule alprazolam twice daily.  I discussed with patient and husband the possibility that she might not return to her previous functional baseline.  There is still wanting to try rehab and I would recommend palliative care following that setting.  However, we discussed the option of hospice at home if patient further declines or does not return to her previous baseline.  Both patient and husband seemed open to the idea of hospice.  PLAN: Schedule alprazolam 0.25 mg twice daily Plan for rehab with palliative care Recommend consideration for future hospice in the home in the event of decline Recommend use of cool room temps and bedside fan   Time Total: 25 minutes  Visit consisted of counseling and education dealing with the   complex and emotionally intense issues of symptom management and palliative care in the setting of serious and potentially life-threatening illness.Greater than 50%  of this time was spent counseling and coordinating care related to the above assessment and plan.  Signed by: Altha Harm,  PhD, DNP, NP-C, [facility] [phone] (Work Cell)

## 2018-02-15 NOTE — Discharge Summary (Signed)
[facility] [place] NAME: Autumn [name]    MR#:  [id number]  DATE OF BIRTH:  [birth year]  DATE OF ADMISSION:  02/04/2018   ADMITTING PHYSICIAN: Amelia Jo, MD  DATE OF DISCHARGE:  02/15/18  PRIMARY CARE PHYSICIAN: Baxter Hire, MD   ADMISSION DIAGNOSIS:   COPD exacerbation ([place]) [J44.1] Acute on chronic respiratory failure with hypoxia ([facility]) [J96.21] Acute respiratory failure ([place]) [J96.00]  DISCHARGE DIAGNOSIS:   Active Problems:   Acute respiratory failure ([facility])   Protein-calorie malnutrition, severe   Palliative care encounter   Acute on chronic respiratory failure with hypoxia ([place])   SECONDARY DIAGNOSIS:   Past Medical History:  Diagnosis Date  . Allergy   . COPD (chronic obstructive pulmonary disease) ([place])   . Glaucoma   . Hypertension   . Osteoporosis   . Ovarian cancer ([place])   . Sarcoid     HOSPITAL COURSE:   72 year old female with past medical history significant for chronic respiratory failure secondary to COPD on 2 L home oxygen, pulmonary sarcoidosis, hypertension, glaucoma and osteoporosis presents to hospital secondary to worsening shortness of breath.  1.  Acute on chronic hypoxic respiratory failure-secondary to COPD exacerbation -initially admitted to ICU, was on BiPAP. -Now only requiring 2 L oxygen which is her chronic home O2, but still has some wheezing -especially with exertion -received IV steroids, inhalers and duo nebs- change to a long taper at discharge - chronically on low dose prednisone -appreciate pulm consult -CT chest last week showing pulmonary sarcoidosis  2.  Hypertension-patient on lisinopril  3.  Glaucoma-continue eyedrops  4.  Sarcoidosis-patient on chronic oral prednisone.     Physical therapy recommended rehab. Discharge to [facility] today   DISCHARGE CONDITIONS:   Guarded  CONSULTS OBTAINED:   Treatment Team:  Allyne Gee, MD  DRUG  ALLERGIES:   Allergies  Allergen Reactions  . Ivp Dye [Iodinated Diagnostic Agents] Hives and Itching   DISCHARGE MEDICATIONS:   Allergies as of 02/15/2018      Reactions   Ivp Dye [iodinated Diagnostic Agents] Hives, Itching      Medication List    STOP taking these medications   ADVAIR DISKUS 250-50 MCG/DOSE Aepb Generic drug:  Fluticasone-Salmeterol   albuterol (2.5 MG/3ML) 0.083% nebulizer solution Commonly known as:  PROVENTIL   Buprenorphine HCl 150 MCG Film   tiZANidine 4 MG tablet Commonly known as:  ZANAFLEX     TAKE these medications   acetaminophen 500 MG tablet Commonly known as:  TYLENOL Take 500 mg by mouth every 4 (four) hours as needed for mild pain or moderate pain.   ALPRAZolam 0.25 MG tablet Commonly known as:  XANAX Take 1 tablet (0.25 mg total) by mouth 2 (two) times daily.   arformoterol 15 MCG/2ML Nebu Commonly known as:  BROVANA Take 2 mLs (15 mcg total) by nebulization 2 (two) times daily.   atenolol 50 MG tablet Commonly known as:  TENORMIN Take 50 mg by mouth daily.   atorvastatin 10 MG tablet Commonly known as:  LIPITOR Take 1 tablet by mouth daily.   azithromycin 250 MG tablet Commonly known as:  ZITHROMAX Take 1 tablet (250 mg total) by mouth daily for 3 days. Start taking on:  02/16/2018   CALCIUM 600+D3 600-400 MG-UNIT Tabs Generic drug:  Calcium Carbonate-Vitamin D3 Take 1 tablet by mouth 2 (two) times daily with a meal.   Fish Oil 1000 MG Caps Take 1,000 mg by mouth  daily.   fluticasone 50 MCG/ACT nasal spray Commonly known as:  FLONASE Place 2 sprays into both nostrils daily.   ipratropium-albuterol 0.5-2.5 (3) MG/3ML Soln Commonly known as:  DUONEB Take 3 mLs by nebulization every 6 (six) hours as needed.   lidocaine 5 % ointment Commonly known as:  XYLOCAINE Apply 1 application topically 4 (four) times daily as needed for moderate pain. What changed:  Another medication with the same name was removed. Continue  taking this medication, and follow the directions you see here.   mirtazapine 7.5 MG tablet Commonly known as:  REMERON Take 1 tablet (7.5 mg total) by mouth at bedtime. What changed:    how much to take  how to take this  when to take this   montelukast 10 MG tablet Commonly known as:  SINGULAIR Take 10 mg by mouth at bedtime.   predniSONE 10 MG (21) Tbpk tablet Commonly known as:  STERAPRED UNI-PAK 21 TAB 6 tabs PO x 2 days 5 tabs PO x 2 days 4 tabs PO x 2 days 3 tabs PO x 2 days 2 tabs PO x 2 days 1 tab PO x 2 days and stop What changed:  You were already taking a medication with the same name, and this prescription was added. Make sure you understand how and when to take each.   predniSONE 10 MG tablet Commonly known as:  DELTASONE Take 1 tablet (10 mg total) by mouth daily. Start after finishing the prednisone taper Start taking on:  02/28/2018 What changed:    additional instructions  These instructions start on 02/28/2018. If you are unsure what to do until then, ask your doctor or other care provider.   pregabalin 100 MG capsule Commonly known as:  LYRICA Take 1 capsule (100 mg total) by mouth 3 (three) times daily.   PROLIA 60 MG/ML Soln injection Generic drug:  denosumab Inject 60 mg into the skin as directed.   sertraline 25 MG tablet Commonly known as:  ZOLOFT Take 25 mg by mouth daily.   SYSTANE BALANCE 0.6 % Soln Generic drug:  Propylene Glycol Place 1 drop into both eyes daily as needed (dryness).        DISCHARGE INSTRUCTIONS:   1. PCP f/u in 1-2 weeks 2. Palliative care f/u after discharge 3. Pulmonary follow up in 2 weeks  DIET:   Cardiac diet  ACTIVITY:   Activity as tolerated  OXYGEN:   Home Oxygen: Yes.    Oxygen Delivery: 2 liters/min via Patient connected to nasal cannula oxygen  DISCHARGE LOCATION:   nursing home   If you experience worsening of your admission symptoms, develop shortness of breath, life  threatening emergency, suicidal or homicidal thoughts you must seek medical attention immediately by calling 911 or calling your MD immediately  if symptoms less severe.  You Must read complete instructions/literature along with all the possible adverse reactions/side effects for all the Medicines you take and that have been prescribed to you. Take any new Medicines after you have completely understood and accpet all the possible adverse reactions/side effects.   Please note  You were cared for by a hospitalist during your hospital stay. If you have any questions about your discharge medications or the care you received while you were in the hospital after you are discharged, you can call the unit and asked to speak with the hospitalist on call if the hospitalist that took care of you is not available. Once you are discharged, your primary care physician  will handle any further medical issues. Please note that NO REFILLS for any discharge medications will be authorized once you are discharged, as it is imperative that you return to your primary care physician (or establish a relationship with a primary care physician if you do not have one) for your aftercare needs so that they can reassess your need for medications and monitor your lab values.    On the day of Discharge:  VITAL SIGNS:   Blood pressure 119/63, pulse 81, temperature 98.6 F (37 C), temperature source Oral, resp. rate 18, height 5\' 7"  (1.702 m), weight 55.7 kg, SpO2 99 %.  PHYSICAL EXAMINATION:    GENERAL:  72 y.o.-year-old thin built patient lying in the bed with no acute distress.  EYES: Pupils equal, round, reactive to light and accommodation. No scleral icterus. Extraocular muscles intact.  HEENT: Head atraumatic, normocephalic. Oropharynx and nasopharynx clear.  NECK:  Supple, no jugular venous distention. No thyroid enlargement, no tenderness.  LUNGS: Improved expiratory wheezing all over the lung fields, worse at the  bases, no rales,rhonchi or crepitation. No use of accessory muscles of respiration.  CARDIOVASCULAR: S1, S2 normal. No  rubs, or gallops. 2/6 systolic murmur present ABDOMEN: Soft, nontender, nondistended. Bowel sounds present. No organomegaly or mass.  EXTREMITIES: No pedal edema, cyanosis, or clubbing.  NEUROLOGIC: Cranial nerves II through XII are intact. Muscle strength 5/5 in all extremities. Sensation intact. Gait not checked. Globally very weak PSYCHIATRIC: The patient is alert and oriented x 3.  SKIN: No obvious rash, lesion, or ulcer.    DATA REVIEW:   CBC Recent Labs  Lab 02/13/18 0459  WBC 17.1*  HGB 10.7*  HCT 36.3  PLT 297    Chemistries  Recent Labs  Lab 02/13/18 0459  NA 145  K 4.6  CL 100  CO2 35*  GLUCOSE 131*  BUN 28*  CREATININE 0.68  CALCIUM 10.0     Microbiology Results  Results for orders placed or performed during the hospital encounter of 02/04/18  MRSA PCR Screening     Status: None   Collection Time: 02/05/18 12:53 AM  Result Value Ref Range Status   MRSA by PCR NEGATIVE NEGATIVE Final    Comment:        The GeneXpert MRSA Assay (FDA approved for NASAL specimens only), is one component of a comprehensive MRSA colonization surveillance program. It is not intended to diagnose MRSA infection nor to guide or monitor treatment for MRSA infections. Performed at [facility], [address], [place], [place] [address]     RADIOLOGY:  No results found.   Management plans discussed with the patient, family and they are in agreement.  CODE STATUS:     Code Status Orders  (From admission, onward)         Start     Ordered   02/09/18 1130  Do not attempt resuscitation (DNR)  Continuous    Question Answer Comment  In the event of cardiac or respiratory ARREST Do not call a "code blue"   In the event of cardiac or respiratory ARREST Do not perform Intubation, CPR, defibrillation or ACLS   In the event of cardiac or  respiratory ARREST Use medication by any route, position, wound care, and other measures to relive pain and suffering. May use oxygen, suction and manual treatment of airway obstruction as needed for comfort.      02/09/18 1129        Code Status History    Date  Active Date Inactive Code Status Order ID Comments User Context   02/05/2018 0050 02/09/2018 1129 Full Code [id number]  Amelia Jo, MD Inpatient   11/17/2015 1929 11/21/2015 1558 Full Code [id number]  Epifanio Lesches, MD ED      TOTAL TIME TAKING CARE OF THIS PATIENT: 38 minutes.    Gladstone Lighter M.D on 02/15/2018 at 2:03 PM  Between 7am to 6pm - Pager - [phone]  After 6pm go to [email or web address] - Proofreader  [employer]  Hospitalists  Office  [phone]  CC: Primary care physician; Baxter Hire, MD   Note: This dictation was prepared with Dragon dictation along with smaller phrase technology. Any transcriptional errors that result from this process are unintentional.

## 2018-02-15 NOTE — Progress Notes (Signed)
Advance care planning  Purpose of Encounter COPD  Parties in Attendance Patient and her husband who is her healthcare power of attorney  Patients Decisional capacity Alert and oriented.  Able to make decisions.  Discussed with patient and husband at bedside regarding COPD exacerbation, prognosis and treatment plan.  We had lengthy discussion regarding pulmonary input, course of COPD and expected timeline for symptoms to improve with her COPD exacerbation.  I have advised that COPD is a progressively worsening disease over time.  That they should expect loss of appetite, weakness, wheezing and cough to linger on for days 2 weeks.  All questions answered. CODE STATUS  is DO NOT RESUSCITATE  Time spent - 20 minutes

## 2018-02-16 ENCOUNTER — Encounter: Payer: Medicare Other | Admitting: Student in an Organized Health Care Education/Training Program

## 2018-02-21 ENCOUNTER — Encounter: Payer: Medicare Other | Admitting: Student in an Organized Health Care Education/Training Program

## 2018-04-07 DIAGNOSIS — F32A Depression, unspecified: Secondary | ICD-10-CM | POA: Insufficient documentation

## 2018-05-01 ENCOUNTER — Other Ambulatory Visit: Payer: Self-pay

## 2018-05-01 ENCOUNTER — Ambulatory Visit
Payer: Medicare Other | Attending: Student in an Organized Health Care Education/Training Program | Admitting: Student in an Organized Health Care Education/Training Program

## 2018-05-01 ENCOUNTER — Encounter: Payer: Self-pay | Admitting: Student in an Organized Health Care Education/Training Program

## 2018-05-01 VITALS — BP 104/60 | HR 74 | Temp 97.8°F | Ht 68.0 in | Wt 120.0 lb

## 2018-05-01 DIAGNOSIS — Z7952 Long term (current) use of systemic steroids: Secondary | ICD-10-CM

## 2018-05-01 DIAGNOSIS — M792 Neuralgia and neuritis, unspecified: Secondary | ICD-10-CM | POA: Insufficient documentation

## 2018-05-01 DIAGNOSIS — Z79891 Long term (current) use of opiate analgesic: Secondary | ICD-10-CM | POA: Diagnosis present

## 2018-05-01 DIAGNOSIS — M5414 Radiculopathy, thoracic region: Secondary | ICD-10-CM | POA: Diagnosis present

## 2018-05-01 DIAGNOSIS — B0229 Other postherpetic nervous system involvement: Secondary | ICD-10-CM

## 2018-05-01 DIAGNOSIS — G894 Chronic pain syndrome: Secondary | ICD-10-CM | POA: Insufficient documentation

## 2018-05-01 MED ORDER — DULOXETINE HCL 30 MG PO CPEP: 30.0000 mg | ORAL_CAPSULE | Freq: Every day | ORAL | 2 refills | Status: DC

## 2018-05-01 MED ORDER — PREGABALIN 100 MG PO CAPS: 100.0000 mg | ORAL_CAPSULE | Freq: Every day | ORAL | 2 refills | Status: DC

## 2018-05-01 NOTE — Progress Notes (Signed)
Nursing Pain Medication Assessment:  Safety precautions to be maintained throughout the outpatient stay will include: orient to surroundings, keep bed in low position, maintain call bell within reach at all times, provide assistance with transfer out of bed and ambulation.  Medication Inspection Compliance: Autumn [name] did not comply with our request to bring her pills to be counted. She was reminded that bringing the medication bottles, even when empty, is a requirement.  Medication: None brought in. Pill/Patch Count: None available to be counted. Bottle Appearance: No container available. Did not bring bottle(s) to appointment. Filled Date: N/A Last Medication intake:  Today

## 2018-05-01 NOTE — Progress Notes (Signed)
Patient's Name: Autumn [name]  MRN: [id number]  Referring Provider: Baxter Hire, MD  DOB: [birth year]  PCP: Baxter Hire, MD  DOS: 05/01/2018  Note by: Gillis Santa, MD  Service setting: Ambulatory outpatient  Specialty: Interventional Pain Management  Location: [facility] (AMB) Pain Management Facility    Patient type: Established   Primary Reason(s) for Visit: Encounter for prescription drug management. (Level of risk: moderate)  CC: Back Pain  HPI  Ms. Tilmon is a 73 y.o. year old, female patient, who comes today for a medication management evaluation. She has Pneumonia; COPD (chronic obstructive pulmonary disease) ([place]); Long term current use of systemic steroids; Post herpetic neuralgia; Pulmonary hypertension ([place]); Pulmonary sarcoidosis ([place]); Acute respiratory failure ([place]); Protein-calorie malnutrition, severe; Palliative care encounter; and Acute on chronic respiratory failure with hypoxia ([facility]) on their problem list. Her primarily concern today is the Back Pain  Pain Assessment: Location: Left Back Radiating: Denies Onset: More than a month ago Duration: Chronic pain Quality: Sharp Severity: 7 /10 (subjective, self-reported pain score)  Note: Reported level is compatible with observation.                         When using our objective Pain Scale, levels between 6 and 10/10 are said to belong in an emergency room, as it progressively worsens from a 6/10, described as severely limiting, requiring emergency care not usually available at an outpatient pain management facility. At a 6/10 level, communication becomes difficult and requires great effort. Assistance to reach the emergency department may be required. Facial flushing and profuse sweating along with potentially dangerous increases in heart rate and blood pressure will be evident. Effect on ADL: limits my daily activities Timing: Constant Modifying factors: tylenol, bio freeze, medications BP: 104/60  HR: 74  Ms. Bardales  was last scheduled for an appointment on 12/27/2017 for medication management. During today's appointment we reviewed Ms. Finkle's chronic pain status, as well as her outpatient medication regimen.  Patient follows up today for medication management.  Patient's last visit with me was in September.  Since then, patient was hospitalized for 12 days at the end of October for COPD exacerbation.  She then went on to pulmonary rehab.  Patient continues to wear oxygen.  She states that her respiratory status has improved since her COPD exacerbation requiring hospitalization.  In regards to her pain.  Patient continues to have lower back and diffuse wide body pain.  She has weaned herself off of belbuca.  No significant increase in her pain since being off belbuca.  She is endorsing vision changes and blurry vision.  This could be secondary to Lyrica.  She is currently on a dose of 100 mg twice daily.  I recommended the patient decrease her Lyrica 100 mg nightly to see if this helps out with her vision symptoms.  Patient has tried and failed gabapentin in the past.  We can consider adjuvant therapy with Cymbalta.  Patient is currently on Zoloft as well, low-dose 25 mg.  Risks of serotonin syndrome discussed with patient however a current dose of Cymbalta and Lexapro, risk is low.  The patient  reports no history of drug use. Her body mass index is 18.25 kg/m.  Further details on both, my assessment(s), as well as the proposed treatment plan, please see below.   Laboratory Chemistry  Inflammation Markers (CRP: Acute Phase) (ESR: Chronic Phase) Lab Results  Component Value Date   ESRSEDRATE 48 (H) 05/25/2011  Rheumatology Markers No results found for: RF, ANA, LABURIC, URICUR, LYMEIGGIGMAB, LYMEABIGMQN, HLAB27                      Renal Function Markers Lab Results  Component Value Date   BUN 28 (H) 02/13/2018   CREATININE 0.68 02/13/2018   GFRAA >60 02/13/2018   GFRNONAA >60  02/13/2018                             Hepatic Function Markers Lab Results  Component Value Date   AST 21 03/29/2015   ALT 20 03/29/2015   ALBUMIN 4.4 03/29/2015   ALKPHOS 46 03/29/2015   LIPASE 116 12/15/2013                        Electrolytes Lab Results  Component Value Date   NA 145 02/13/2018   K 4.6 02/13/2018   CL 100 02/13/2018   CALCIUM 10.0 02/13/2018   MG 2.7 (H) 02/07/2018   PHOS 3.2 02/07/2018                        Neuropathy Markers No results found for: VITAMINB12, FOLATE, HGBA1C, HIV                      CNS Tests No results found for: COLORCSF, APPEARCSF, RBCCOUNTCSF, WBCCSF, POLYSCSF, LYMPHSCSF, EOSCSF, PROTEINCSF, GLUCCSF, JCVIRUS, CSFOLI, IGGCSF                      Bone Pathology Markers No results found for: VD25OH, [id number], [id number], NL8921JH4, 25OHVITD1, 25OHVITD2, 25OHVITD3, TESTOFREE, TESTOSTERONE                       Coagulation Parameters Lab Results  Component Value Date   INR 1.0 02/17/2013   LABPROT 13.2 02/17/2013   PLT 297 02/13/2018                        Cardiovascular Markers Lab Results  Component Value Date   BNP 53.0 02/04/2018   CKTOTAL 66 02/17/2013   CKMB 0.7 02/17/2013   TROPONINI 0.03 (HH) 02/05/2018   HGB 10.7 (L) 02/13/2018   HCT 36.3 02/13/2018                         CA Markers No results found for: CEA, CA125, LABCA2                      Note: Lab results reviewed.  Recent Diagnostic Imaging Results  DG Chest 2 View CLINICAL DATA:  Shortness of breath  EXAM: CHEST - 2 VIEW  COMPARISON:  02/09/2018  FINDINGS: Cardiac shadow is stable. The lungs are hyperinflated. Stable scoliosis is seen. Scarring is noted in the apices bilaterally without acute focal infiltrate. No new focal abnormality is seen. Multiple calcified lymph nodes are identified in the hila and mediastinum consistent with prior granulomatous disease.  IMPRESSION: Chronic apical scarring without acute  abnormality.  Electronically Signed   By: Inez Catalina M.D.   On: 02/12/2018 15:32  Complexity Note: Imaging results reviewed. Results shared with Ms. Eulas Post, using Layman's terms.                         Meds  Current Outpatient Medications:  .  acetaminophen (TYLENOL) 500 MG tablet, Take 500 mg by mouth every 4 (four) hours as needed for mild pain or moderate pain., Disp: , Rfl:  .  ALPRAZolam (XANAX) 0.25 MG tablet, Take 1 tablet (0.25 mg total) by mouth 2 (two) times daily., Disp: 25 tablet, Rfl: 0 .  arformoterol (BROVANA) 15 MCG/2ML NEBU, Take 2 mLs (15 mcg total) by nebulization 2 (two) times daily., Disp: 120 mL, Rfl: 1 .  atenolol (TENORMIN) 50 MG tablet, Take 50 mg by mouth daily. , Disp: , Rfl:  .  atorvastatin (LIPITOR) 10 MG tablet, Take 1 tablet by mouth daily. , Disp: , Rfl:  .  CALCIUM 600+D3 600-400 MG-UNIT TABS, Take 1 tablet by mouth 2 (two) times daily with a meal. , Disp: , Rfl: 11 .  denosumab (PROLIA) 60 MG/ML SOLN injection, Inject 60 mg into the skin as directed. , Disp: , Rfl:  .  fluticasone (FLONASE) 50 MCG/ACT nasal spray, Place 2 sprays into both nostrils daily., Disp: , Rfl:  .  ipratropium-albuterol (DUONEB) 0.5-2.5 (3) MG/3ML SOLN, Take 3 mLs by nebulization every 6 (six) hours as needed., Disp: 360 mL, Rfl: 0 .  mirtazapine (REMERON) 7.5 MG tablet, Take 1 tablet (7.5 mg total) by mouth at bedtime., Disp: 30 tablet, Rfl: 2 .  montelukast (SINGULAIR) 10 MG tablet, Take 10 mg by mouth at bedtime., Disp: , Rfl:  .  Omega-3 Fatty Acids (FISH OIL) 1000 MG CAPS, Take 1,000 mg by mouth daily. , Disp: , Rfl:  .  predniSONE (DELTASONE) 10 MG tablet, Take 1 tablet (10 mg total) by mouth daily. Start after finishing the prednisone taper, Disp: , Rfl:  .  pregabalin (LYRICA) 100 MG capsule, Take 1 capsule (100 mg total) by mouth at bedtime., Disp: 60 capsule, Rfl: 2 .  Propylene Glycol (SYSTANE BALANCE) 0.6 % SOLN, Place 1 drop into both eyes daily as needed  (dryness)., Disp: , Rfl:  .  sertraline (ZOLOFT) 25 MG tablet, Take 25 mg by mouth daily., Disp: , Rfl: 11 .  DULoxetine (CYMBALTA) 30 MG capsule, Take 1 capsule (30 mg total) by mouth daily., Disp: 30 capsule, Rfl: 2  ROS  Constitutional: Denies any fever or chills Gastrointestinal: No reported hemesis, hematochezia, vomiting, or acute GI distress Musculoskeletal: Denies any acute onset joint swelling, redness, loss of ROM, or weakness Neurological: No reported episodes of acute onset apraxia, aphasia, dysarthria, agnosia, amnesia, paralysis, loss of coordination, or loss of consciousness  Allergies  Ms. Castille is allergic to ivp dye [iodinated diagnostic agents].  [place]  Drug: Ms. Caicedo  reports no history of drug use. Alcohol:  reports no history of alcohol use. Tobacco:  reports that she has never smoked. She has never used smokeless tobacco. Medical:  has a past medical history of Allergy, COPD (chronic obstructive pulmonary disease) ([place]), Glaucoma, Hypertension, Osteoporosis, Ovarian cancer ([place]), and Sarcoid. Surgical: Ms. Hazell  has a past surgical history that includes Breast biopsy (Right) and Abdominal hysterectomy. Family: family history includes Breast cancer (age of onset: 64) in her mother; Cancer in her mother; Hypertension in her father.  Constitutional Exam  General appearance: Well nourished, well developed, and well hydrated. In no apparent acute distress Vitals:   05/01/18 1352  BP: 104/60  Pulse: 74  Temp: 97.8 F (36.6 C)  SpO2: 98%  Weight: 120 lb (54.4 kg)  Height: _0  (1.727 m)   BMI Assessment: Estimated body mass index is 18.25 kg/m as calculated from  the following:   Height as of this encounter: _0  (1.727 m).   Weight as of this encounter: 120 lb (54.4 kg).  BMI interpretation table: BMI level Category Range association with higher incidence of chronic pain  <18 kg/m2 Underweight   18.5-24.9 kg/m2 Ideal body weight   25-29.9 kg/m2  Overweight Increased incidence by 20%  30-34.9 kg/m2 Obese (Class I) Increased incidence by 68%  35-39.9 kg/m2 Severe obesity (Class II) Increased incidence by 136%  >40 kg/m2 Extreme obesity (Class III) Increased incidence by 254%   Patient's current BMI Ideal Body weight  Body mass index is 18.25 kg/m. Ideal body weight: 63.9 kg (140 lb 14 oz)   BMI Readings from Last 4 Encounters:  05/01/18 18.25 kg/m  02/14/18 19.22 kg/m  12/27/17 19.26 kg/m  11/22/17 18.40 kg/m   Wt Readings from Last 4 Encounters:  05/01/18 120 lb (54.4 kg)  02/14/18 122 lb 11.2 oz (55.7 kg)  12/27/17 123 lb (55.8 kg)  11/22/17 121 lb (54.9 kg)  Psych/Mental status: Alert, oriented x 3 (person, place, & time)       Eyes: PERLA Respiratory: Oxygen-dependent COPD  Cervical Spine Area Exam  Skin & Axial Inspection: No masses, redness, edema, swelling, or associated skin lesions Alignment: Symmetrical Functional ROM: Unrestricted ROM      Stability: No instability detected Muscle Tone/Strength: Functionally intact. No obvious neuro-muscular anomalies detected. Sensory (Neurological): Unimpaired Palpation: No palpable anomalies              Upper Extremity (UE) Exam    Side: Right upper extremity  Side: Left upper extremity  Skin & Extremity Inspection: Skin color, temperature, and hair growth are WNL. No peripheral edema or cyanosis. No masses, redness, swelling, asymmetry, or associated skin lesions. No contractures.  Skin & Extremity Inspection: Skin color, temperature, and hair growth are WNL. No peripheral edema or cyanosis. No masses, redness, swelling, asymmetry, or associated skin lesions. No contractures.  Functional ROM: Unrestricted ROM          Functional ROM: Unrestricted ROM          Muscle Tone/Strength: Functionally intact. No obvious neuro-muscular anomalies detected.  Muscle Tone/Strength: Functionally intact. No obvious neuro-muscular anomalies detected.  Sensory (Neurological):  Unimpaired          Sensory (Neurological): Unimpaired          Palpation: No palpable anomalies              Palpation: No palpable anomalies              Provocative Test(s):  Phalen's test: deferred Tinel's test: deferred Apley's scratch test (touch opposite shoulder):  Action 1 (Across chest): deferred Action 2 (Overhead): deferred Action 3 (LB reach): deferred   Provocative Test(s):  Phalen's test: deferred Tinel's test: deferred Apley's scratch test (touch opposite shoulder):  Action 1 (Across chest): deferred Action 2 (Overhead): deferred Action 3 (LB reach): deferred    Thoracic Spine Area Exam  Skin & Axial Inspection:No masses, redness, or swelling Alignment:Symmetrical Functional RSW:[id number] ROM Stability:No instability detected Muscle Tone/Strength:Functionally intact. No obvious neuro-muscular anomalies detected. Sensory (Neurological):tenderness of left back and flank region T4-T6 dermatome with allodynia of left thoracic back, at inferior scapular border Muscle strength & Tone:Tender  Lumbar Spine Area Exam  Skin & Axial Inspection:No masses, redness, or swelling Alignment:Symmetrical Functional GHW:[id number] ROM Stability:No instability detected Muscle Tone/Strength:Functionally intact. No obvious neuro-muscular anomalies detected. Sensory (Neurological):Unimpaired Palpation:No palpable anomalies Provocative Tests: Lumbar Hyperextension and rotation test:Positivebilaterally for facet joint  pain. Lumbar Lateral bending test:Positive Patrick's Maneuver:evaluation deferred today    Gait & Posture Assessment  Ambulation:Patient came in today in a wheel chair Gait:Limited. Using assistive device to ambulate Posture:Difficulty standing up straight, due to pain  Lower Extremity Exam    Side:Right lower extremity  Side:Left lower extremity  Stability:No instability observed   Stability:No instability observed  Skin & Extremity Inspection:Skin color, temperature, and hair growth are WNL. No peripheral edema or cyanosis. No masses, redness, swelling, asymmetry, or associated skin lesions. No contractures.  Skin & Extremity Inspection:Skin color, temperature, and hair growth are WNL. No peripheral edema or cyanosis. No masses, redness, swelling, asymmetry, or associated skin lesions. No contractures.  Functional TOI:[id number] ROM   Functional ASN:[id number] ROM   Muscle Tone/Strength:Functionally intact. No obvious neuro-muscular anomalies detected.  Muscle Tone/Strength:Functionally intact. No obvious neuro-muscular anomalies detected.  Sensory (Neurological):Unimpaired  Sensory (Neurological):Unimpaired  Palpation:No palpable anomalies  Palpation:No palpable anomalies    Assessment  Primary Diagnosis & Pertinent Problem List: The primary encounter diagnosis was Post herpetic neuralgia. Diagnoses of Thoracic radiculopathy, Chronic pain syndrome, Chronic use of opiate for therapeutic purpose, Neuropathic pain, and Long term current use of systemic steroids were also pertinent to this visit.  Status Diagnosis  Controlled Controlled Controlled 1. Post herpetic neuralgia   2. Thoracic radiculopathy   3. Chronic pain syndrome   4. Chronic use of opiate for therapeutic purpose   5. Neuropathic pain   6. Long term current use of systemic steroids      73 year old female with a history of left flank and chest pain secondary to postherpetic neuralgia in 2013 who presents formed refill/chronic pain management. Patient was previously being [facility]. Her symptoms weremanaged on [place] film:Belbuca600 mcg twice daily along which she has weaned off under my care. She is status post bilateral T5-T6 transforaminal epidural steroid injection in June 2018 with no pain relief and also status post DRG  spinal cord stimulator trial on Aug 16, 2016 without any significant pain relief.  Patient follows up today for medication management.  Patient's last visit with me was in September.  Since then, patient was hospitalized for 12 days at the end of October for COPD exacerbation.  She then went on to pulmonary rehab.  Patient continues to wear oxygen.  She states that her respiratory status has improved since her COPD exacerbation requiring hospitalization.  In regards to her pain.  Patient continues to have thoracic and lower back and diffuse wide body pain.  She has weaned herself off of belbuca.  No significant increase in her pain since being off belbuca.  She is endorsing vision changes and blurry vision.  This could be secondary to Lyrica.  She is currently on a dose of 100 mg twice daily.  I recommended the patient decrease her Lyrica to 100 mg nightly to see if this helps out with her vision symptoms.  Patient has tried and failed gabapentin in the past.  We can consider adjuvant therapy with Cymbalta.  Patient is currently on Zoloft as well, low-dose 25 mg.  Risks of serotonin syndrome discussed with patient however a current dose of Cymbalta and Lexapro, risk is low.  Future considerations:  thoracic intercostal nerve blocks at T5/6 and/or thoracic facet injections.  Plan of Care  Pharmacotherapy (Medications Ordered): Meds ordered this encounter  Medications  . pregabalin (LYRICA) 100 MG capsule    Sig: Take 1 capsule (100 mg total) by mouth at bedtime.    Dispense:  60 capsule    Refill:  2    Do not place this medication, or any other prescription from our practice, on "Automatic Refill". Patient may have prescription filled one day early if pharmacy is closed on scheduled refill date.  . DULoxetine (CYMBALTA) 30 MG capsule    Sig: Take 1 capsule (30 mg total) by mouth daily.    Dispense:  30 capsule    Refill:  2   Provider-requested follow-up: Return in about 8 weeks (around  06/26/2018) for Medication Management.  Future Appointments  Date Time Provider [place]  06/22/2018 10:30 AM Gillis Santa, MD [facility] None    Primary Care Physician: Baxter Hire, MD Location: [facility] Note by: Gillis Santa, M.D Date: 05/01/2018; Time: 3:03 PM  Patient Instructions  1. Decrease Lyrica to 100 mg at night 2. Ok to start Cymbalta 30 mg daily  Cymbalta and Lyrica has been escribed to your pharmacy.

## 2018-05-01 NOTE — Patient Instructions (Addendum)
1. Decrease Lyrica to 100 mg at night 2. Ok to start Cymbalta 30 mg daily  Cymbalta and Lyrica has been escribed to your pharmacy.

## 2018-05-12 ENCOUNTER — Other Ambulatory Visit: Payer: Self-pay | Admitting: Internal Medicine

## 2018-05-12 DIAGNOSIS — Z1231 Encounter for screening mammogram for malignant neoplasm of breast: Secondary | ICD-10-CM

## 2018-05-23 ENCOUNTER — Other Ambulatory Visit: Payer: Self-pay | Admitting: Student in an Organized Health Care Education/Training Program

## 2018-06-12 ENCOUNTER — Telehealth: Payer: Self-pay | Admitting: Student in an Organized Health Care Education/Training Program

## 2018-06-12 NOTE — Telephone Encounter (Signed)
Pt called and stated that at her last visit dr Holley Raring prescribed her a muscle relaxer along with her lyrica. Pt states she wasn't able to take the muscle relaxer and had to take more lyrica and wanted to know if she could get a refill of her lyrica. I asked pt if she called and let anyone know that the muscle relaxer wasn't working and she stated no.

## 2018-06-12 NOTE — Telephone Encounter (Signed)
Spoke with patient.  She states that the Cymbalta didn't work so she started back taking 2 of the Lyrica and has not ran out. Notified patient that Dr Holley Raring was out of the office this week, but that I would give him the message.

## 2018-06-19 ENCOUNTER — Ambulatory Visit
Admission: RE | Admit: 2018-06-19 | Discharge: 2018-06-19 | Disposition: A | Discharge status: Home / Self Care | Payer: Medicare Other | Source: Ambulatory Visit | Attending: Internal Medicine | Admitting: Internal Medicine

## 2018-06-19 DIAGNOSIS — Z1231 Encounter for screening mammogram for malignant neoplasm of breast: Secondary | ICD-10-CM | POA: Insufficient documentation

## 2018-06-20 DIAGNOSIS — K6282 Dysplasia of anus: Secondary | ICD-10-CM | POA: Insufficient documentation

## 2018-06-22 ENCOUNTER — Encounter: Payer: Medicare Other | Admitting: Student in an Organized Health Care Education/Training Program

## 2018-08-01 ENCOUNTER — Other Ambulatory Visit: Payer: Self-pay | Admitting: Cardiology

## 2018-08-01 DIAGNOSIS — R1084 Generalized abdominal pain: Secondary | ICD-10-CM

## 2018-09-11 ENCOUNTER — Ambulatory Visit
Admission: RE | Admit: 2018-09-11 | Discharge: 2018-09-11 | Disposition: A | Discharge status: Home / Self Care | Payer: Medicare Other | Source: Ambulatory Visit | Attending: Cardiology | Admitting: Cardiology

## 2018-09-11 ENCOUNTER — Other Ambulatory Visit: Payer: Self-pay

## 2018-09-11 DIAGNOSIS — R1084 Generalized abdominal pain: Secondary | ICD-10-CM | POA: Insufficient documentation

## 2018-10-18 ENCOUNTER — Telehealth: Payer: Self-pay

## 2018-10-18 NOTE — Telephone Encounter (Signed)
Pre virtual appointment call.  Voicemail has not been set up so unable to leave a message.

## 2018-10-19 ENCOUNTER — Encounter: Payer: Self-pay | Admitting: Student in an Organized Health Care Education/Training Program

## 2018-10-19 ENCOUNTER — Ambulatory Visit
Payer: Medicare Other | Attending: Student in an Organized Health Care Education/Training Program | Admitting: Student in an Organized Health Care Education/Training Program

## 2018-10-19 ENCOUNTER — Other Ambulatory Visit: Payer: Self-pay

## 2018-10-19 VITALS — BP 184/85 | HR 78 | Temp 98.2°F | Resp 16 | Ht 68.0 in | Wt 139.0 lb

## 2018-10-19 DIAGNOSIS — Z79891 Long term (current) use of opiate analgesic: Secondary | ICD-10-CM | POA: Diagnosis not present

## 2018-10-19 DIAGNOSIS — B0229 Other postherpetic nervous system involvement: Secondary | ICD-10-CM

## 2018-10-19 DIAGNOSIS — M5414 Radiculopathy, thoracic region: Secondary | ICD-10-CM

## 2018-10-19 DIAGNOSIS — Z7952 Long term (current) use of systemic steroids: Secondary | ICD-10-CM

## 2018-10-19 DIAGNOSIS — G894 Chronic pain syndrome: Secondary | ICD-10-CM

## 2018-10-19 DIAGNOSIS — M792 Neuralgia and neuritis, unspecified: Secondary | ICD-10-CM

## 2018-10-19 MED ORDER — PREGABALIN 75 MG PO CAPS: 75.0000 mg | ORAL_CAPSULE | Freq: Three times a day (TID) | ORAL | 2 refills | Status: DC

## 2018-10-19 MED ORDER — HYDROCODONE-ACETAMINOPHEN 5-325 MG PO TABS: 1.0000 | ORAL_TABLET | Freq: Every day | ORAL | 0 refills | Status: AC | PRN

## 2018-10-19 NOTE — Progress Notes (Signed)
Safety precautions to be maintained throughout the outpatient stay will include: orient to surroundings, keep bed in low position, maintain call bell within reach at all times, provide assistance with transfer out of bed and ambulation.  

## 2018-10-19 NOTE — Progress Notes (Signed)
Patient's Name: Autumn [name]  MRN: [id number]  Referring Provider: Baxter Hire, MD  DOB: Aug 29, 1945  PCP: Baxter Hire, MD  DOS: 10/19/2018  Note by: Gillis Santa, MD  Service setting: Ambulatory outpatient  Attending: Gillis Santa, MD  Location: [facility] (AMB) Pain Management Facility  Specialty: Interventional Pain Management  Patient type: Established   Primary Reason(s) for Visit: Encounter for prescription drug management. (Level of risk: moderate)  CC: Back Pain (lower, mid)  HPI  Autumn [name] is a 73 y.o. year old, female patient, who comes today for a medication management evaluation. She has Pneumonia; COPD (chronic obstructive pulmonary disease) ([place]); Long term current use of systemic steroids; Post herpetic neuralgia; Pulmonary hypertension ([place]); Pulmonary sarcoidosis ([place]); Acute respiratory failure ([place]); Protein-calorie malnutrition, severe; Palliative care encounter; and Acute on chronic respiratory failure with hypoxia (HCC) on their problem list. Her primarily concern today is the Back Pain (lower, mid)  Pain Assessment: Location: Lower, Mid Back Radiating:   Onset: More than a month ago Duration: Chronic pain Quality: Sharp Severity: 7 /10 (subjective, self-reported pain score)  Note: Reported level is compatible with observation.                         When using our objective Pain Scale, levels between 6 and 10/10 are said to belong in an emergency room, as it progressively worsens from a 6/10, described as severely limiting, requiring emergency care not usually available at an outpatient pain management facility. At a 6/10 level, communication becomes difficult and requires great effort. Assistance to reach the emergency department may be required. Facial flushing and profuse sweating along with potentially dangerous increases in heart rate and blood pressure will be evident. Effect on ADL: limits daily activities Timing: Constant Modifying factors: tylenol sometimes  help BP: (!) 184/85  HR: 78  [name] was last scheduled for an appointment on 06/12/2018 for medication management. During today's appointment we reviewed Autumn [name]'s chronic pain status, as well as her outpatient medication regimen.  Patient follows up today for medication management.  She states that Cymbalta was not effective.  She has went back on Lyrica.  She is taking this at 75 mg twice daily.  She only has mild sedation.  She states that it is somewhat beneficial.  She is requesting a dose increase to 3 times a day.  This is reasonable.  Patient would also like medication for breakthrough pain.  She has tried hydrocodone in the past and I believe low-dose small quantity is reasonable for when she has breakthrough pain.  The patient  reports no history of drug use. Her body mass index is 21.13 kg/m.  Further details on both, my assessment(s), as well as the proposed treatment plan, please see below.  Controlled Substance Pharmacotherapy Assessment REMS (Risk Evaluation and Mitigation Strategy)  Analgesic: Start hydrocodone 5 mg; 1/2 tablet to 1 tablet daily PRN Rise Patience, RN  10/19/2018  9:36 AM  Signed Safety precautions to be maintained throughout the outpatient stay will include: orient to surroundings, keep bed in low position, maintain call bell within reach at all times, provide assistance with transfer out of bed and ambulation.     Summary  Date Value Ref Range Status  03/22/2017 FINAL  Final    Comment:    ==================================================================== TOXASSURE COMP DRUG ANALYSIS,UR ==================================================================== Test  Result       Flag       Units Drug Present and Declared for Prescription Verification   Buprenorphine                  23           EXPECTED   ng/mg creat   Norbuprenorphine               112          EXPECTED   ng/mg creat    Source of buprenorphine is a  scheduled prescription medication.    Norbuprenorphine is an expected metabolite of buprenorphine.   Gabapentin                     PRESENT      EXPECTED   Sertraline                     PRESENT      EXPECTED   Desmethylsertraline            PRESENT      EXPECTED    Desmethylsertraline is an expected metabolite of sertraline.   Atenolol                       PRESENT      EXPECTED Drug Absent but Declared for Prescription Verification   Hydrocodone                    Not Detected UNEXPECTED ng/mg creat   Tramadol                       Not Detected UNEXPECTED ng/mg creat   Acetaminophen                  Not Detected UNEXPECTED    Acetaminophen, as indicated in the declared medication list, is    not always detected even when used as directed.   Ibuprofen                      Not Detected UNEXPECTED    Ibuprofen, as indicated in the declared medication list, is not    always detected even when used as directed.   Diphenhydramine                Not Detected UNEXPECTED ==================================================================== Test                      Result    Flag   Units      Ref Range   Creatinine              182              mg/dL      >=20 ==================================================================== Declared Medications:  The flagging and interpretation on this report are based on the  following declared medications.  Unexpected results may arise from  inaccuracies in the declared medications.  **Note: The testing scope of this panel includes these medications:  Atenolol  Buprenorphine  Diphenhydramine  Gabapentin  Hydrocodone (Hydrocodone-Acetaminophen)  Sertraline  Tramadol  **Note: The testing scope of this panel does not include small to  moderate amounts of these reported medications:  Acetaminophen (Hydrocodone-Acetaminophen)  Ibuprofen  **Note: The testing scope of this panel does not include following  reported medications:  Albuterol  Amoxicillin  (Augmentin)  Atorvastatin  Azithromycin  Brimonidine  Tartrate  Brinzolamide  Calcium (Calcium/Vitamin D3)  Clavulinate (Augmentin)  Fluticasone (Advair)  Latanoprost  Omega-3 Fatty Acids  Ondansetron  Prednisone  Salmeterol (Advair)  Tiotropium  Topical  Vitamin D3 (Calcium/Vitamin D3) ==================================================================== For clinical consultation, please call [phone]. ====================================================================    UDS interpretation: Not applicable          Medication Assessment Form: N/A Treatment compliance: N/A Risk Assessment Profile: Aberrant behavior: See initial evaluations. None observed or detected today Comorbid factors increasing risk of overdose: See initial evaluation. No additional risks detected today Opioid risk tool (ORT):  Opioid Risk  05/01/2018  Alcohol 0  Illegal Drugs 0  Rx Drugs 0  Alcohol 0  Illegal Drugs 0  Rx Drugs 0  Age between 16-45 years  0  History of Preadolescent Sexual Abuse 0  Psychological Disease 0  Depression 0  Opioid Risk Tool Scoring 0  Opioid Risk Interpretation Low Risk    ORT Scoring interpretation table:  Score <3 = Low Risk for SUD  Score between 4-7 = Moderate Risk for SUD  Score >8 = High Risk for Opioid Abuse   Risk of substance use disorder (SUD): Low  Risk Mitigation Strategies:  Patient Counseling: Covered Patient-Prescriber Agreement (PPA): Present and active  Notification to other healthcare providers: Done  Pharmacologic Plan: Low-dose hydrocodone for breakthrough pain.  Increase Lyrica to 3 times daily             Laboratory Chemistry   SAFETY SCREENING Profile Lab Results  Component Value Date   MRSAPCR NEGATIVE 02/05/2018   Inflammation Markers (CRP: Acute Phase) (ESR: Chronic Phase) Lab Results  Component Value Date   ESRSEDRATE 48 (H) 05/25/2011                         Rheumatology Markers No results found for: RF, ANA,  LABURIC, URICUR, LYMEIGGIGMAB, LYMEABIGMQN, HLAB27                      Renal Function Markers Lab Results  Component Value Date   BUN 28 (H) 02/13/2018   CREATININE 0.68 02/13/2018   GFRAA >60 02/13/2018   GFRNONAA >60 02/13/2018                             Hepatic Function Markers Lab Results  Component Value Date   AST 21 03/29/2015   ALT 20 03/29/2015   ALBUMIN 4.4 03/29/2015   ALKPHOS 46 03/29/2015   LIPASE 116 12/15/2013                        Electrolytes Lab Results  Component Value Date   NA 145 02/13/2018   K 4.6 02/13/2018   CL 100 02/13/2018   CALCIUM 10.0 02/13/2018   MG 2.7 (H) 02/07/2018   PHOS 3.2 02/07/2018                        Neuropathy Markers No results found for: VITAMINB12, FOLATE, HGBA1C, HIV                      CNS Tests No results found for: COLORCSF, APPEARCSF, RBCCOUNTCSF, WBCCSF, POLYSCSF, LYMPHSCSF, EOSCSF, PROTEINCSF, GLUCCSF, JCVIRUS, CSFOLI, IGGCSF, LABACHR, ACETBL                      Bone Pathology Markers No results  found for: Marveen Reeks, [id number], [id number], 25OHVITD1, 25OHVITD2, 25OHVITD3, TESTOFREE, TESTOSTERONE                       Coagulation Parameters Lab Results  Component Value Date   INR 1.0 02/17/2013   LABPROT 13.2 02/17/2013   PLT 297 02/13/2018                        Cardiovascular Markers Lab Results  Component Value Date   BNP 53.0 02/04/2018   CKTOTAL 66 02/17/2013   CKMB 0.7 02/17/2013   TROPONINI 0.03 (HH) 02/05/2018   HGB 10.7 (L) 02/13/2018   HCT 36.3 02/13/2018                         ID Test(s) Lab Results  Component Value Date   MRSAPCR NEGATIVE 02/05/2018   MICROTEXT  05/18/2014       SOURCE: RIGHT WRIST    COMMENT                   NO GROWTH IN 75 HOURS   ANTIBIOTIC                                                       MICROTEXT  05/18/2014       SOURCE: RIGHT HAND    COMMENT                   NO GROWTH IN 48 HOURS   ANTIBIOTIC                                                         CA Markers No results found for: CEA, CA125, LABCA2                      Endocrine Markers No results found for: TSH, FREET4, TESTOFREE, TESTOSTERONE, SHBG, ESTRADIOL, ESTRADIOLPCT, ESTRADIOLFRE, LABPREG, ACTH                      Note: Lab results reviewed.  Recent Diagnostic Imaging Results  US Abdomen Complete CLINICAL DATA:  Abdominal pain  EXAM: ABDOMEN ULTRASOUND COMPLETE  COMPARISON:  12/16/2013  FINDINGS: Gallbladder: No gallstones or wall thickening visualized. No sonographic Murphy sign noted by sonographer.  Common bile duct: Diameter: 1.5  Liver: No focal lesion identified. Within normal limits in parenchymal echogenicity. Portal vein is patent on color Doppler imaging with normal direction of blood flow towards the liver.  IVC: No abnormality visualized.  Pancreas: Visualized portion unremarkable.  Spleen: Size and appearance within normal limits. Small calcifications in the spleen likely dystrophic.  Right Kidney: Length: 10.9 cm. Echogenicity within normal limits. No mass. Mild hydronephrosis.  Left Kidney: Length: 11 cm. Echogenicity within normal limits. No hydronephrosis. 13 mm anechoic mid pole renal mass most consistent with a cyst.  Abdominal aorta: Abdominal aortic ectasia with atherosclerosis. Abdominal aorta measures 2.5 cm in diameter.  Other findings: None.  IMPRESSION: 1. No cholelithiasis or sonographic evidence of acute cholecystitis. 2. Mild right hydronephrosis. 3. Ectatic abdominal aorta, at risk  for aneurysm development. Recommend follow-up aortic ultrasound in 5 years. This recommendation follows ACR consensus guidelines: White Paper of the ACR Incidental Findings Committee II on Vascular Findings. J Am Coll Radiol 2013; 10:789-794.  Electronically Signed   By: Kathreen Devoid   On: 09/11/2018 10:16  Complexity Note: Imaging results reviewed. Results shared with Ms. Eulas Post, using Layman's terms.                                Meds   Current Outpatient Medications:  .  acetaminophen (TYLENOL) 500 MG tablet, Take 500 mg by mouth every 4 (four) hours as needed for mild pain or moderate pain., Disp: , Rfl:  .  arformoterol (BROVANA) 15 MCG/2ML NEBU, Take 2 mLs (15 mcg total) by nebulization 2 (two) times daily., Disp: 120 mL, Rfl: 1 .  atenolol (TENORMIN) 50 MG tablet, Take 50 mg by mouth daily. , Disp: , Rfl:  .  atorvastatin (LIPITOR) 10 MG tablet, Take 1 tablet by mouth daily. , Disp: , Rfl:  .  denosumab (PROLIA) 60 MG/ML SOLN injection, Inject 60 mg into the skin as directed. , Disp: , Rfl:  .  fluticasone (FLONASE) 50 MCG/ACT nasal spray, Place 2 sprays into both nostrils daily., Disp: , Rfl:  .  HYDROcodone-acetaminophen (NORCO/VICODIN) 5-325 MG tablet, Take 1 tablet by mouth daily as needed for severe pain. Must last 30 days., Disp: 30 tablet, Rfl: 0 .  ipratropium-albuterol (DUONEB) 0.5-2.5 (3) MG/3ML SOLN, Take 3 mLs by nebulization every 6 (six) hours as needed., Disp: 360 mL, Rfl: 0 .  mirtazapine (REMERON) 7.5 MG tablet, Take 1 tablet (7.5 mg total) by mouth at bedtime., Disp: 30 tablet, Rfl: 2 .  montelukast (SINGULAIR) 10 MG tablet, Take 10 mg by mouth at bedtime., Disp: , Rfl:  .  Omega-3 Fatty Acids (FISH OIL) 1000 MG CAPS, Take 1,000 mg by mouth daily. , Disp: , Rfl:  .  predniSONE (DELTASONE) 10 MG tablet, Take 1 tablet (10 mg total) by mouth daily. Start after finishing the prednisone taper, Disp: , Rfl:  .  pregabalin (LYRICA) 75 MG capsule, Take 1 capsule (75 mg total) by mouth 3 (three) times daily., Disp: 90 capsule, Rfl: 2 .  Propylene Glycol (SYSTANE BALANCE) 0.6 % SOLN, Place 1 drop into both eyes daily as needed (dryness)., Disp: , Rfl:  .  sertraline (ZOLOFT) 25 MG tablet, Take 25 mg by mouth daily., Disp: , Rfl: 11 .  CALCIUM 600+D3 600-400 MG-UNIT TABS, Take 1 tablet by mouth 2 (two) times daily with a meal. , Disp: , Rfl: 11  ROS  Constitutional: Denies any fever or  chills Gastrointestinal: No reported hemesis, hematochezia, vomiting, or acute GI distress Musculoskeletal: Denies any acute onset joint swelling, redness, loss of ROM, or weakness Neurological: No reported episodes of acute onset apraxia, aphasia, dysarthria, agnosia, amnesia, paralysis, loss of coordination, or loss of consciousness  Allergies  Ms. Shippee is allergic to ivp dye [iodinated diagnostic agents].  [place]  Drug: Ms. Suire  reports no history of drug use. Alcohol:  reports no history of alcohol use. Tobacco:  reports that she has never smoked. She has never used smokeless tobacco. Medical:  has a past medical history of Allergy, COPD (chronic obstructive pulmonary disease) ([place]), Glaucoma, Hypertension, Osteoporosis, Ovarian cancer ([place]), and Sarcoid. Surgical: Ms. Sens  has a past surgical history that includes Abdominal hysterectomy and Breast excisional biopsy ([place]). Family: family history includes  Breast cancer (age of onset: 49) in her mother; Cancer in her mother; Hypertension in her father.  Constitutional Exam  General appearance: Well nourished, well developed, and well hydrated. In no apparent acute distress Vitals:   10/19/18 0948  BP: (!) 184/85  Pulse: 78  Resp: 16  Temp: 98.2 F (36.8 C)  TempSrc: Oral  SpO2: 100%  Weight: 139 lb (63 kg)  Height: _0  (1.727 m)   BMI Assessment: Estimated body mass index is 21.13 kg/m as calculated from the following:   Height as of this encounter: _1  (1.727 m).   Weight as of this encounter: 139 lb (63 kg).  BMI interpretation table: BMI level Category Range association with higher incidence of chronic pain  <18 kg/m2 Underweight   18.5-24.9 kg/m2 Ideal body weight   25-29.9 kg/m2 Overweight Increased incidence by 20%  30-34.9 kg/m2 Obese (Class I) Increased incidence by 68%  35-39.9 kg/m2 Severe obesity (Class II) Increased incidence by 136%  >40 kg/m2 Extreme obesity (Class III) Increased incidence by 254%    Patient's current BMI Ideal Body weight  Body mass index is 21.13 kg/m. Ideal body weight: 63.9 kg (140 lb 14 oz)   BMI Readings from Last 4 Encounters:  10/19/18 21.13 kg/m  05/01/18 18.25 kg/m  02/14/18 19.22 kg/m  12/27/17 19.26 kg/m   Wt Readings from Last 4 Encounters:  10/19/18 139 lb (63 kg)  05/01/18 120 lb (54.4 kg)  02/14/18 122 lb 11.2 oz (55.7 kg)  12/27/17 123 lb (55.8 kg)  Psych/Mental status: Alert, oriented x 3 (person, place, & time)       Eyes: PERLA Respiratory: No evidence of acute respiratory distress  Cervical Spine Area Exam  Skin & Axial Inspection: No masses, redness, edema, swelling, or associated skin lesions Alignment: Symmetrical Functional ROM: Unrestricted ROM      Stability: No instability detected Muscle Tone/Strength: Functionally intact. No obvious neuro-muscular anomalies detected. Sensory (Neurological): Unimpaired Palpation: No palpable anomalies              Upper Extremity (UE) Exam    Side: Right upper extremity  Side: Left upper extremity  Skin & Extremity Inspection: Skin color, temperature, and hair growth are WNL. No peripheral edema or cyanosis. No masses, redness, swelling, asymmetry, or associated skin lesions. No contractures.  Skin & Extremity Inspection: Skin color, temperature, and hair growth are WNL. No peripheral edema or cyanosis. No masses, redness, swelling, asymmetry, or associated skin lesions. No contractures.  Functional ROM: Unrestricted ROM          Functional ROM: Unrestricted ROM          Muscle Tone/Strength: Functionally intact. No obvious neuro-muscular anomalies detected.  Muscle Tone/Strength: Functionally intact. No obvious neuro-muscular anomalies detected.  Sensory (Neurological): Unimpaired          Sensory (Neurological): Unimpaired          Palpation: No palpable anomalies              Palpation: No palpable anomalies              Provocative Test(s):  Phalen's test: deferred Tinel's test:  deferred Apley's scratch test (touch opposite shoulder):  Action 1 (Across chest): deferred Action 2 (Overhead): deferred Action 3 (LB reach): deferred   Provocative Test(s):  Phalen's test: deferred Tinel's test: deferred Apley's scratch test (touch opposite shoulder):  Action 1 (Across chest): deferred Action 2 (Overhead): deferred Action 3 (LB reach): deferred    Thoracic Spine Area Exam  Skin & Axial Inspection:No masses, redness, or swelling Alignment:Symmetrical Functional HYQ:[id number] ROM Stability:No instability detected Muscle Tone/Strength:Functionally intact. No obvious neuro-muscular anomalies detected. Sensory (Neurological):tenderness of left back and flank region T4-T6 dermatome with allodynia of left thoracic back, at inferior scapular border Muscle strength & Tone:Tender  Lumbar Spine Area Exam  Skin & Axial Inspection:No masses, redness, or swelling Alignment:Symmetrical Functional WUX:[id number] ROM Stability:No instability detected Muscle Tone/Strength:Functionally intact. No obvious neuro-muscular anomalies detected. Sensory (Neurological):Unimpaired Palpation:No palpable anomalies Provocative Tests: Lumbar Hyperextension and rotation test:Positivebilaterally for facet joint pain. Lumbar Lateral bending test:Positive Patrick's Maneuver:evaluation deferred today    Gait & Posture Assessment  Ambulation:Patient came in today in a wheel chair Gait:Limited. Using assistive device to ambulate Posture:Difficulty standing up straight, due to pain  Lower Extremity Exam    Side:Right lower extremity  Side:Left lower extremity  Stability:No instability observed  Stability:No instability observed  Skin & Extremity Inspection:Skin color, temperature, and hair growth are WNL. No peripheral edema or cyanosis. No masses, redness, swelling, asymmetry, or associated skin  lesions. No contractures.  Skin & Extremity Inspection:Skin color, temperature, and hair growth are WNL. No peripheral edema or cyanosis. No masses, redness, swelling, asymmetry, or associated skin lesions. No contractures.  Functional GUY:[id number] ROM   Functional OVF:[id number] ROM   Muscle Tone/Strength:Functionally intact. No obvious neuro-muscular anomalies detected.  Muscle Tone/Strength:Functionally intact. No obvious neuro-muscular anomalies detected.  Sensory (Neurological):Unimpaired  Sensory (Neurological):Unimpaired   Assessment   Status Diagnosis  Controlled Controlled Controlled 1. Post herpetic neuralgia   2. Thoracic radiculopathy   3. Chronic pain syndrome   4. Chronic use of opiate for therapeutic purpose   5. Neuropathic pain   6. Long term current use of systemic steroids      Increase Lyrica to 75 mg 3 times daily.  If side effects of sedation can modify dosing 200 mg twice daily.  Hydrocodone, low-dose for breakthrough pain.  Quantity 30 to hopefully last 6 to 8 weeks.  Plan of Care  Pharmacotherapy (Medications Ordered): Meds ordered this encounter  Medications  . pregabalin (LYRICA) 75 MG capsule    Sig: Take 1 capsule (75 mg total) by mouth 3 (three) times daily.    Dispense:  90 capsule    Refill:  2    Do not place this medication, or any other prescription from our practice, on "Automatic Refill". Patient may have prescription filled one day early if pharmacy is closed on scheduled refill date.  [name] HYDROcodone-acetaminophen (NORCO/VICODIN) 5-325 MG tablet    Sig: Take 1 tablet by mouth daily as needed for severe pain. Must last 30 days.    Dispense:  30 tablet    Refill:  0    Chronic Pain. (STOP Act - Not applicable). Fill one day early if closed on scheduled refill date.    Planned follow-up:   Return in about 8 weeks (around 12/14/2018) for Medication Management.        Recent Visits No visits were  found meeting these conditions.  Showing recent visits within past 90 days and meeting all other requirements   Today's Visits Date Type Provider Dept  10/19/18 Office Visit Gillis Santa, MD [facility]  Showing today's visits and meeting all other requirements   Future Appointments Date Type Provider Dept  12/14/18 Appointment Gillis Santa, MD [facility]  Showing future appointments within next 90 days and meeting all other requirements   Primary Care Physician: Baxter Hire, MD Location: [facility] Note by: Carlus Pavlov  Holley Raring, MD Date: 10/19/2018; Time: 10:54 AM  Note: This dictation was prepared with Dragon dictation. Any transcriptional errors that may result from this process are unintentional.

## 2018-10-19 NOTE — Patient Instructions (Signed)
Hydrocodone with Tylenol and Lyrica has been escribed to your pharmacy.

## 2018-11-23 ENCOUNTER — Telehealth: Payer: Self-pay | Admitting: *Deleted

## 2018-11-23 NOTE — Telephone Encounter (Signed)
Attempted to call patient, voicemail not set up

## 2018-11-24 NOTE — Telephone Encounter (Signed)
Returned patient phone call re; swelling in legs after taking hydrocodone.  States tbat she noticed the swelling and the next day she did not take the medication and the swelling went away.  Sbe describes the swelling as just being puffy and denies any other adverse reactions. I did tell her that she could either stop the medication all together or if she wanted to try again and make sure she is drinking plenty of fluids and see if that same reaction occurs.  Patient verbalizes u/o instruction.  Has an appt on 12/14/18 for medication management.

## 2018-11-27 ENCOUNTER — Telehealth: Payer: Self-pay | Admitting: Student in an Organized Health Care Education/Training Program

## 2018-11-27 NOTE — Telephone Encounter (Addendum)
Patient states she is out of meds  Hydrocodone. Also was prescribed Lyrica. Only received 1 month each but med refill appt is for Sept 3. Please check and let patient know status of med refill.

## 2018-11-27 NOTE — Telephone Encounter (Signed)
Please schedule for an earlier appointment for med refill.  Patient has refills on Lyrica, but not for Hydrocodone.

## 2018-11-28 NOTE — Telephone Encounter (Signed)
Patient decided she wants to keep original appt. She isnt sure she wants to take those meds again because they didn't do that much good.

## 2018-11-28 NOTE — Telephone Encounter (Signed)
Tried to call patient at 3:45 11-27-18 unable to contact,

## 2018-12-13 ENCOUNTER — Encounter: Payer: Self-pay | Admitting: Student in an Organized Health Care Education/Training Program

## 2018-12-14 ENCOUNTER — Encounter: Payer: Self-pay | Admitting: Student in an Organized Health Care Education/Training Program

## 2018-12-14 ENCOUNTER — Ambulatory Visit
Payer: Medicare Other | Attending: Student in an Organized Health Care Education/Training Program | Admitting: Student in an Organized Health Care Education/Training Program

## 2018-12-14 ENCOUNTER — Other Ambulatory Visit: Payer: Self-pay

## 2018-12-14 DIAGNOSIS — M5414 Radiculopathy, thoracic region: Secondary | ICD-10-CM | POA: Diagnosis not present

## 2018-12-14 DIAGNOSIS — B0229 Other postherpetic nervous system involvement: Secondary | ICD-10-CM | POA: Diagnosis not present

## 2018-12-14 DIAGNOSIS — Z79891 Long term (current) use of opiate analgesic: Secondary | ICD-10-CM | POA: Diagnosis not present

## 2018-12-14 DIAGNOSIS — G894 Chronic pain syndrome: Secondary | ICD-10-CM

## 2018-12-14 DIAGNOSIS — Z7952 Long term (current) use of systemic steroids: Secondary | ICD-10-CM

## 2018-12-14 DIAGNOSIS — M792 Neuralgia and neuritis, unspecified: Secondary | ICD-10-CM | POA: Insufficient documentation

## 2018-12-14 MED ORDER — PREGABALIN 75 MG PO CAPS: 75.0000 mg | ORAL_CAPSULE | Freq: Three times a day (TID) | ORAL | 1 refills | Status: DC

## 2018-12-14 MED ORDER — HYDROCODONE-ACETAMINOPHEN 10-325 MG PO TABS: 1.0000 | ORAL_TABLET | Freq: Two times a day (BID) | ORAL | 0 refills | Status: DC | PRN

## 2018-12-14 NOTE — Progress Notes (Signed)
Pain Management Virtual Encounter Note - Virtual Visit via [place] (real-time audio visits between healthcare provider and patient).   Patient's Phone No. & Preferred Pharmacy:  [phone] (home); [phone] (mobile); (Preferred) [phone] [email or web address]  [id number] - [place], [place] - [address] [address] Phone: [phone] Fax: [phone]    Pre-screening note:  Our staff contacted Autumn [name] and offered her an "in person", "face-to-face" appointment versus a telephone encounter. She indicated preferring the telephone encounter, at this time.   Reason for Virtual Visit: COVID-19*  Social distancing based on CDC and AMA recommendations.   I contacted Autumn [name] on 12/14/2018 via video conference.      I clearly identified myself as Gillis Santa, MD. I verified that I was speaking with the correct person using two identifiers (Name: Autumn [name], and date of birth: 1946-01-28).  Advanced Informed Consent I sought verbal advanced consent from Autumn [name] for virtual visit interactions. I informed Autumn [name] of possible security and privacy concerns, risks, and limitations associated with providing "not-in-person" medical evaluation and management services. I also informed Autumn [name] of the availability of "in-person" appointments. Finally, I informed her that there would be a charge for the virtual visit and that she could be  personally, fully or partially, financially responsible for it. Autumn [name] expressed understanding and agreed to proceed.   Historic Elements   Autumn [name] is a 73 y.o. year old, female patient evaluated today after her last encounter by our practice on 11/27/2018. Autumn [name]  has a past medical history of Allergy, COPD (chronic obstructive pulmonary disease) ([place]), Glaucoma, Hypertension, Osteoporosis, Ovarian cancer ([place]), and Sarcoid. She also  has a past surgical history that  includes Abdominal hysterectomy and Breast excisional biopsy ([place]). Autumn [name] has a current medication list which includes the following prescription(s): acetaminophen, arformoterol, atenolol, atorvastatin, cetirizine, denosumab, fluticasone, ipratropium-albuterol, montelukast, prednisone, pregabalin, propylene glycol, sertraline, calcium 600+d3, hydrocodone-acetaminophen, and fish oil. She  reports that she has never smoked. She has never used smokeless tobacco. She reports that she does not drink alcohol or use drugs. Autumn [name] is allergic to ivp dye [iodinated diagnostic agents].   HPI  Today, she is being contacted for medication management.  Continues to have worsening low back pain that is making it difficult for her to get around her house.  She describes it as a bandlike pattern.  We discussed increasing her hydrocodone from 5 mg to 10 mg.  Patient is tried buprenorphine in the past and was on high-dose belbuca which was not effective for her.  I would like for her to continue her Lyrica as prescribed 75 mg 3 times daily.  Pharmacotherapy Assessment  Analgesic 12/05/2018  1   10/19/2018  Pregabalin 75 MG Capsule  90.00 30 Bi Lat   [id number]   Nor (0921)   1  1.51 LME  Medicare   [place]  11/01/2018  1   10/19/2018  Pregabalin 75 MG Capsule  90.00 30 Bi Lat   [id number]   Nor (0921)   0  1.51 LME  Medicare   [place]  10/19/2018  1   10/19/2018  Hydrocodone-Acetamin 5-325 MG  30.00 30 Bi Lat   [id number]   [place] (0921)   0  5.00 MME  Private Pay   [place]     Monitoring: Pharmacotherapy: No side-effects or adverse reactions reported. [place] PMP: PDMP not reviewed this encounter.  Compliance: No problems identified. Effectiveness: Clinically acceptable. Plan: Refer to "POC".  UDS:  Summary  Date Value Ref Range Status  03/22/2017 FINAL  Final    Comment:    ==================================================================== TOXASSURE COMP DRUG  ANALYSIS,UR ==================================================================== Test                             Result       Flag       Units Drug Present and Declared for Prescription Verification   Buprenorphine                  23           EXPECTED   ng/mg creat   Norbuprenorphine               112          EXPECTED   ng/mg creat    Source of buprenorphine is a scheduled prescription medication.    Norbuprenorphine is an expected metabolite of buprenorphine.   Gabapentin                     PRESENT      EXPECTED   Sertraline                     PRESENT      EXPECTED   Desmethylsertraline            PRESENT      EXPECTED    Desmethylsertraline is an expected metabolite of sertraline.   Atenolol                       PRESENT      EXPECTED Drug Absent but Declared for Prescription Verification   Hydrocodone                    Not Detected UNEXPECTED ng/mg creat   Tramadol                       Not Detected UNEXPECTED ng/mg creat   Acetaminophen                  Not Detected UNEXPECTED    Acetaminophen, as indicated in the declared medication list, is    not always detected even when used as directed.   Ibuprofen                      Not Detected UNEXPECTED    Ibuprofen, as indicated in the declared medication list, is not    always detected even when used as directed.   Diphenhydramine                Not Detected UNEXPECTED ==================================================================== Test                      Result    Flag   Units      Ref Range   Creatinine              182              mg/dL      >=20 ==================================================================== Declared Medications:  The flagging and interpretation on this report are based on the  following declared medications.  Unexpected results may arise from  inaccuracies in the declared medications.  **Note: The testing scope of this panel includes these  medications:  Atenolol  Buprenorphine   Diphenhydramine  Gabapentin  Hydrocodone (Hydrocodone-Acetaminophen)  Sertraline  Tramadol  **Note: The testing scope of this panel does not include small to  moderate amounts of these reported medications:  Acetaminophen (Hydrocodone-Acetaminophen)  Ibuprofen  **Note: The testing scope of this panel does not include following  reported medications:  Albuterol  Amoxicillin (Augmentin)  Atorvastatin  Azithromycin  Brimonidine Tartrate  Brinzolamide  Calcium (Calcium/Vitamin D3)  Clavulinate (Augmentin)  Fluticasone (Advair)  Latanoprost  Omega-3 Fatty Acids  Ondansetron  Prednisone  Salmeterol (Advair)  Tiotropium  Topical  Vitamin D3 (Calcium/Vitamin D3) ==================================================================== For clinical consultation, please call [phone]. ====================================================================    Laboratory Chemistry Profile (12 mo)  Renal: 02/13/2018: BUN 28; Creatinine, Ser 0.68  Lab Results  Component Value Date   GFRAA >60 02/13/2018   GFRNONAA >60 02/13/2018   Hepatic: No results found for requested labs within last 8760 hours. Lab Results  Component Value Date   AST 21 03/29/2015   ALT 20 03/29/2015   Other: No results found for requested labs within last 8760 hours. Note: Above Lab results reviewed.  Imaging  Last 90 days:  No results found.  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 Diagnoses of Thoracic radiculopathy, Chronic pain syndrome, Chronic use of opiate for therapeutic purpose, Neuropathic pain, and Long term current use of systemic steroids were also pertinent to this visit.  Plan of Care  I have discontinued Markeda I. Chervenak's mirtazapine. I am also having her start on HYDROcodone-acetaminophen. Additionally, I am having her maintain her atenolol, atorvastatin, Calcium 600+D3, Fish Oil, sertraline, denosumab, montelukast, acetaminophen, fluticasone, Propylene  Glycol, arformoterol, ipratropium-albuterol, predniSONE, cetirizine, and pregabalin.  Pharmacotherapy (Medications Ordered): Meds ordered this encounter  Medications  . pregabalin (LYRICA) 75 MG capsule    Sig: Take 1 capsule (75 mg total) by mouth 3 (three) times daily.    Dispense:  90 capsule    Refill:  1    Do not place this medication, or any other prescription from our practice, on "Automatic Refill". Patient may have prescription filled one day early if pharmacy is closed on scheduled refill date.  [name] HYDROcodone-acetaminophen (NORCO) 10-325 MG tablet    Sig: Take 1 tablet by mouth 2 (two) times daily as needed for severe pain. Must last 30 days.    Dispense:  60 tablet    Refill:  0    Chronic Pain. (STOP Act - Not applicable). Fill one day early if closed on scheduled refill date.   Orders:  No orders of the defined types were placed in this encounter.  Follow-up plan:   Return in about 8 weeks (around 02/08/2019) for Medication Management, virtual.        Recent Visits Date Type Provider Dept  10/19/18 Office Visit Gillis Santa, MD [facility]  Showing recent visits within past 90 days and meeting all other requirements   Today's Visits Date Type Provider Dept  12/14/18 Office Visit Gillis Santa, MD [facility]  Showing today's visits and meeting all other requirements   Future Appointments No visits were found meeting these conditions.  Showing future appointments within next 90 days and meeting all other requirements   I discussed the assessment and treatment plan with the patient. The patient was provided an opportunity to ask questions and all were answered. The patient agreed with the plan and demonstrated an understanding of the instructions.  Patient advised to call back or seek an in-person evaluation if  the symptoms or condition worsens.  Total duration of non-face-to-face encounter: 35minutes.  Note by: Gillis Santa, MD Date:  12/14/2018; Time: 12:06 PM  Note: This dictation was prepared with Dragon dictation. Any transcriptional errors that may result from this process are unintentional.  Disclaimer:  * Given the special circumstances of the COVID-19 pandemic, the federal government has announced that the Office for Civil Rights (OCR) will exercise its enforcement discretion and will not impose penalties on physicians using telehealth in the event of noncompliance with regulatory requirements under the [place] and [place] (HIPAA) in connection with the good faith provision of telehealth during the XX123456 national public health emergency. ([place])

## 2019-01-24 ENCOUNTER — Telehealth: Payer: Self-pay

## 2019-01-24 NOTE — Telephone Encounter (Signed)
She ran out of oxycodone, has med refill 10/29.  I told her that if her meds were to last until her appt on 10/29 then he would not give her any more. She wants to know what she is supposed to do for the pain then.

## 2019-01-25 NOTE — Telephone Encounter (Signed)
Hydrocodone ordered 12-14-2018 and patient is now out of medication.  She is ordered 2 times a day prn and was given a quantity of #60.  Her next appointment is 10-29.

## 2019-01-29 ENCOUNTER — Other Ambulatory Visit: Payer: Self-pay | Admitting: Student in an Organized Health Care Education/Training Program

## 2019-01-29 ENCOUNTER — Ambulatory Visit
Payer: Medicare Other | Attending: Student in an Organized Health Care Education/Training Program | Admitting: Student in an Organized Health Care Education/Training Program

## 2019-01-29 ENCOUNTER — Other Ambulatory Visit: Payer: Self-pay

## 2019-01-29 ENCOUNTER — Encounter: Payer: Self-pay | Admitting: Student in an Organized Health Care Education/Training Program

## 2019-01-29 DIAGNOSIS — B0229 Other postherpetic nervous system involvement: Secondary | ICD-10-CM

## 2019-01-29 DIAGNOSIS — M5414 Radiculopathy, thoracic region: Secondary | ICD-10-CM | POA: Diagnosis not present

## 2019-01-29 DIAGNOSIS — G894 Chronic pain syndrome: Secondary | ICD-10-CM | POA: Diagnosis not present

## 2019-01-29 DIAGNOSIS — Z7952 Long term (current) use of systemic steroids: Secondary | ICD-10-CM

## 2019-01-29 DIAGNOSIS — Z79891 Long term (current) use of opiate analgesic: Secondary | ICD-10-CM | POA: Diagnosis not present

## 2019-01-29 DIAGNOSIS — M792 Neuralgia and neuritis, unspecified: Secondary | ICD-10-CM

## 2019-01-29 MED ORDER — HYDROCODONE-ACETAMINOPHEN 10-325 MG PO TABS: 1.0000 | ORAL_TABLET | Freq: Two times a day (BID) | ORAL | 0 refills | Status: AC | PRN

## 2019-01-29 MED ORDER — PREGABALIN 75 MG PO CAPS: 75.0000 mg | ORAL_CAPSULE | Freq: Three times a day (TID) | ORAL | 5 refills | Status: DC

## 2019-01-29 NOTE — Progress Notes (Signed)
Pain Management Virtual Encounter Note - Virtual Visit via [place] (real-time audio visits between healthcare provider and patient).   Patient's Phone No. & Preferred Pharmacy:  [phone] (home); [phone] (mobile); (Preferred) [phone] [email or web address]  [id number] [place], [place] - [address] [address] Phone: [phone] Fax: [phone]    Pre-screening note:  Our staff contacted [name] and offered her an "in person", "face-to-face" appointment versus a telephone encounter. She indicated preferring the telephone encounter, at this time.   Reason for Virtual Visit: COVID-19*  Social distancing based on CDC and AMA recommendations.   I contacted Autumn [name] on 01/29/2019 via video conference.      I clearly identified myself as Gillis Santa, MD. I verified that I was speaking with the correct person using two identifiers (Name: Autumn [name], and date of birth: 05-Oct-1945).  Advanced Informed Consent I sought verbal advanced consent from Autumn [name] for virtual visit interactions. I informed [name] of possible security and privacy concerns, risks, and limitations associated with providing "not-in-person" medical evaluation and management services. I also informed [name] of the availability of "in-person" appointments. Finally, I informed her that there would be a charge for the virtual visit and that she could be  personally, fully or partially, financially responsible for it. [name] expressed understanding and agreed to proceed.   Historic Elements   [name] is a 73 y.o. year old, female patient evaluated today after her last encounter by our practice on 01/24/2019. [name]  has a past medical history of Allergy, COPD (chronic obstructive pulmonary disease) ([place]), Glaucoma, Hypertension, Osteoporosis, Ovarian cancer ([place]), and Sarcoid. She also  has a past surgical history  that includes Abdominal hysterectomy and Breast excisional biopsy ([place]). [name] has a current medication list which includes the following prescription(s): acetaminophen, arformoterol, atenolol, atorvastatin, cetirizine, denosumab, fluticasone, hydrocodone-acetaminophen, hydrocodone-acetaminophen, hydrocodone-acetaminophen, ipratropium-albuterol, montelukast, prednisone, pregabalin, propylene glycol, sertraline, calcium 600+d3, and fish oil. She  reports that she has never smoked. She has never used smokeless tobacco. She reports that she does not drink alcohol or use drugs. [name] is allergic to ivp dye [iodinated diagnostic agents].   HPI  Today, she is being contacted for medication management.  No change in medical history since last visit.  Patient's pain is at baseline.  Patient continues multimodal pain regimen as prescribed.  States that it provides pain relief and improvement in functional status.   Pharmacotherapy Assessment  Analgesic:  01/02/2019  1   10/19/2018  Pregabalin 75 MG Capsule  90.00  30 Bi Lat   [id number]   Nor (0921)   2  1.51 LME  Medicare   [place]  12/14/2018  1   12/14/2018  Hydrocodone-Acetamin 10-325 MG  60.00  30 Bi Lat   [id number]   Nor (0921)   0  20.00 MME  Medicare   [place]     Monitoring: Pharmacotherapy: No side-effects or adverse reactions reported.  PMP: PDMP reviewed during this encounter.       Compliance: No problems identified. Effectiveness: Clinically acceptable. Plan: Refer to "POC".  UDS:  Summary  Date Value Ref Range Status  03/22/2017 FINAL  Final    Comment:    ==================================================================== TOXASSURE COMP DRUG ANALYSIS,UR ==================================================================== Test                             Result  Flag       Units Drug Present and Declared for Prescription Verification   Buprenorphine                  23           EXPECTED   ng/mg creat   Norbuprenorphine                112          EXPECTED   ng/mg creat    Source of buprenorphine is a scheduled prescription medication.    Norbuprenorphine is an expected metabolite of buprenorphine.   Gabapentin                     PRESENT      EXPECTED   Sertraline                     PRESENT      EXPECTED   Desmethylsertraline            PRESENT      EXPECTED    Desmethylsertraline is an expected metabolite of sertraline.   Atenolol                       PRESENT      EXPECTED Drug Absent but Declared for Prescription Verification   Hydrocodone                    Not Detected UNEXPECTED ng/mg creat   Tramadol                       Not Detected UNEXPECTED ng/mg creat   Acetaminophen                  Not Detected UNEXPECTED    Acetaminophen, as indicated in the declared medication list, is    not always detected even when used as directed.   Ibuprofen                      Not Detected UNEXPECTED    Ibuprofen, as indicated in the declared medication list, is not    always detected even when used as directed.   Diphenhydramine                Not Detected UNEXPECTED ==================================================================== Test                      Result    Flag   Units      Ref Range   Creatinine              182              mg/dL      >=20 ==================================================================== Declared Medications:  The flagging and interpretation on this report are based on the  following declared medications.  Unexpected results may arise from  inaccuracies in the declared medications.  **Note: The testing scope of this panel includes these medications:  Atenolol  Buprenorphine  Diphenhydramine  Gabapentin  Hydrocodone (Hydrocodone-Acetaminophen)  Sertraline  Tramadol  **Note: The testing scope of this panel does not include small to  moderate amounts of these reported medications:  Acetaminophen (Hydrocodone-Acetaminophen)  Ibuprofen  **Note: The testing scope of this  panel does not include following  reported medications:  Albuterol  Amoxicillin (Augmentin)  Atorvastatin  Azithromycin  Brimonidine Tartrate  Brinzolamide  Calcium (Calcium/Vitamin D3)  Clavulinate (Augmentin)  Fluticasone (Advair)  Latanoprost  Omega-3 Fatty Acids  Ondansetron  Prednisone  Salmeterol (Advair)  Tiotropium  Topical  Vitamin D3 (Calcium/Vitamin D3) ==================================================================== For clinical consultation, please call [phone]. ====================================================================    Laboratory Chemistry Profile (12 mo)  Renal: 02/13/2018: BUN 28; Creatinine, Ser 0.68  Lab Results  Component Value Date   GFRAA >60 02/13/2018   GFRNONAA >60 02/13/2018   Hepatic: No results found for requested labs within last 8760 hours. Lab Results  Component Value Date   AST 21 03/29/2015   ALT 20 03/29/2015   Other: No results found for requested labs within last 8760 hours. Note: Above Lab results reviewed.   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 Diagnoses of Thoracic radiculopathy, Chronic pain syndrome, Chronic use of opiate for therapeutic purpose, Neuropathic pain, and Long term current use of systemic steroids were also pertinent to this visit.  Plan of Care  I am having Briar I. Sian start on HYDROcodone-acetaminophen and HYDROcodone-acetaminophen. I am also having her maintain her atenolol, atorvastatin, Calcium 600+D3, Fish Oil, sertraline, denosumab, montelukast, acetaminophen, fluticasone, Propylene Glycol, arformoterol, ipratropium-albuterol, predniSONE, cetirizine, HYDROcodone-acetaminophen, and pregabalin.  Pharmacotherapy (Medications Ordered): Meds ordered this encounter  Medications  . HYDROcodone-acetaminophen (NORCO) 10-325 MG tablet    Sig: Take 1 tablet by mouth 2 (two) times daily as needed for severe pain. Must last 30 days.    Dispense:  60 tablet     Refill:  0    Chronic Pain. (STOP Act - Not applicable). Fill one day early if closed on scheduled refill date.  [name] HYDROcodone-acetaminophen (NORCO) 10-325 MG tablet    Sig: Take 1 tablet by mouth 2 (two) times daily as needed for severe pain. Must last 30 days.    Dispense:  60 tablet    Refill:  0    Chronic Pain. (STOP Act - Not applicable). Fill one day early if closed on scheduled refill date.  [name] HYDROcodone-acetaminophen (NORCO) 10-325 MG tablet    Sig: Take 1 tablet by mouth 2 (two) times daily as needed for severe pain. Must last 30 days.    Dispense:  60 tablet    Refill:  0    Chronic Pain. (STOP Act - Not applicable). Fill one day early if closed on scheduled refill date.  . pregabalin (LYRICA) 75 MG capsule    Sig: Take 1 capsule (75 mg total) by mouth 3 (three) times daily.    Dispense:  90 capsule    Refill:  5    Do not place this medication, or any other prescription from our practice, on "Automatic Refill". Patient may have prescription filled one day early if pharmacy is closed on scheduled refill date.   Follow-up plan:   Return in about 3 months (around 05/01/2019) for Medication Management.    Recent Visits Date Type Provider Dept  12/14/18 Office Visit Gillis Santa, MD [facility]  Showing recent visits within past 90 days and meeting all other requirements   Today's Visits Date Type Provider Dept  01/29/19 Office Visit Gillis Santa, MD [facility]  Showing today's visits and meeting all other requirements   Future Appointments No visits were found meeting these conditions.  Showing future appointments within next 90 days and meeting all other requirements   I discussed the assessment and treatment plan with the patient. The patient was provided an opportunity to ask questions and all were answered. The patient agreed with the plan and demonstrated  an understanding of the instructions.  Patient advised to call back or seek an  in-person evaluation if the symptoms or condition worsens.  Total duration of non-face-to-face encounter: 19minutes.  Note by: Gillis Santa, MD Date: 01/29/2019; Time: 2:56 PM  Note: This dictation was prepared with Dragon dictation. Any transcriptional errors that may result from this process are unintentional.  Disclaimer:  * Given the special circumstances of the COVID-19 pandemic, the federal government has announced that the Office for Civil Rights (OCR) will exercise its enforcement discretion and will not impose penalties on physicians using telehealth in the event of noncompliance with regulatory requirements under the [place] and [place] (HIPAA) in connection with the good faith provision of telehealth during the XX123456 national public health emergency. ([place])

## 2019-02-08 ENCOUNTER — Ambulatory Visit: Payer: Medicare Other | Admitting: Student in an Organized Health Care Education/Training Program

## 2019-03-28 ENCOUNTER — Telehealth: Payer: Self-pay

## 2019-03-28 NOTE — Telephone Encounter (Signed)
Patient called in to say she is out of pain medicines. She states her bottle said no refills. Her next appointment is 05/01/19. Can someone check on this and call her with information?

## 2019-03-28 NOTE — Telephone Encounter (Signed)
Patient notified that she had a prescription that could be filled on 03-30-19

## 2019-04-09 ENCOUNTER — Telehealth: Payer: Self-pay | Admitting: *Deleted

## 2019-04-09 NOTE — Telephone Encounter (Signed)
Patient left voicemail that she needs a refill on her Lyrica.  I did review her chart and that was prescribed in October with 5 refills.  Attempted to call patient back to let her know.  No answer at home phone and mobile has a voicemail that has not been set up yet.

## 2019-04-10 NOTE — Telephone Encounter (Signed)
Spoke with patient to let her know that she had 5 refills from the Rx that was written in October.  She states that someone called her this morning and had already taken care of this.  She did not know who.  States she has already picked up the medicine.

## 2019-04-30 ENCOUNTER — Encounter: Payer: Self-pay | Admitting: Student in an Organized Health Care Education/Training Program

## 2019-05-01 ENCOUNTER — Ambulatory Visit
Payer: Medicare PPO | Attending: Student in an Organized Health Care Education/Training Program | Admitting: Student in an Organized Health Care Education/Training Program

## 2019-05-01 ENCOUNTER — Other Ambulatory Visit: Payer: Self-pay

## 2019-05-01 ENCOUNTER — Encounter: Payer: Self-pay | Admitting: Student in an Organized Health Care Education/Training Program

## 2019-05-01 DIAGNOSIS — G894 Chronic pain syndrome: Secondary | ICD-10-CM

## 2019-05-01 DIAGNOSIS — M792 Neuralgia and neuritis, unspecified: Secondary | ICD-10-CM | POA: Diagnosis not present

## 2019-05-01 DIAGNOSIS — M47894 Other spondylosis, thoracic region: Secondary | ICD-10-CM | POA: Diagnosis not present

## 2019-05-01 DIAGNOSIS — B0229 Other postherpetic nervous system involvement: Secondary | ICD-10-CM

## 2019-05-01 DIAGNOSIS — Z79891 Long term (current) use of opiate analgesic: Secondary | ICD-10-CM | POA: Diagnosis not present

## 2019-05-01 MED ORDER — HYDROCODONE-ACETAMINOPHEN 10-325 MG PO TABS: 1.0000 | ORAL_TABLET | Freq: Three times a day (TID) | ORAL | 0 refills | Status: AC | PRN

## 2019-05-01 MED ORDER — PREGABALIN 75 MG PO CAPS: 75.0000 mg | ORAL_CAPSULE | Freq: Three times a day (TID) | ORAL | 5 refills | Status: DC

## 2019-05-01 NOTE — Progress Notes (Signed)
Autumn [name]: Autumn [name]  Service Category: E/M  Provider: Gillis Santa, MD  DOB: May 21, 1945  DOS: 05/01/2019  Location: Office  MRN: [id number]  Setting: Ambulatory outpatient  Referring Provider: Baxter Hire, MD  Type: Established Autumn [name]  Specialty: Interventional Pain Management  PCP: Baxter Hire, MD  Location: Home  Delivery: TeleHealth     Virtual Encounter - Pain Management PROVIDER NOTE: Information contained herein reflects review and annotations entered in association with encounter. Interpretation of such information and data should be left to medically-trained personnel. Information provided to Autumn [name] can be located elsewhere in the medical record under "Autumn [name] Instructions". Document created using STT-dictation technology, any transcriptional errors that may result from process are unintentional.    Contact & Pharmacy Preferred: [phone] Home: [phone] (home) Mobile: [phone] (mobile) E-mail: [email or web address]  [id number] - [place], [place] - [address] [place] [address] Phone: [phone] Fax: [phone]   Pre-screening  [name] offered "in-person" vs "virtual" encounter. She indicated preferring virtual for this encounter.   Reason COVID-19*  Social distancing based on CDC and AMA recommendations.   I contacted Autumn [name] on 05/01/2019 via video conference.      I clearly identified myself as Gillis Santa, MD. I verified that I was speaking with the correct person using two identifiers (Name: Autumn [name], and date of birth: [birth year]).  Consent I sought verbal advanced consent from Autumn [name] for virtual visit interactions. I informed Autumn [name] of possible security and privacy concerns, risks, and limitations associated with providing "not-in-person" medical evaluation and management services. I also informed Autumn [name] of the availability of "in-person" appointments. Finally, I informed her that  there would be a charge for the virtual visit and that she could be  personally, fully or partially, financially responsible for it. Autumn [name] expressed understanding and agreed to proceed.   Historic Elements   Autumn [name] is a 74 y.o. year old, female Autumn [name] evaluated today after her last encounter by our practice on 04/09/2019. Autumn [name]  has a past medical history of Allergy, COPD (chronic obstructive pulmonary disease) ([place]), Glaucoma, Hypertension, Osteoporosis, Ovarian cancer ([place]), and Sarcoid. She also  has a past surgical history that includes Abdominal hysterectomy and Breast excisional biopsy ([place]). Autumn [name] has a current medication list which includes the following prescription(s): acetaminophen, atenolol, atorvastatin, cetirizine, denosumab, fluticasone, ipratropium-albuterol, montelukast, prednisone, pregabalin, propylene glycol, sertraline, calcium 600+d3, hydrocodone-acetaminophen, [START ON 05/31/2019] hydrocodone-acetaminophen, and fish oil. She  reports that she has never smoked. She has never used smokeless tobacco. She reports that she does not drink alcohol or use drugs. Autumn [name] is allergic to ivp dye [iodinated diagnostic agents].   HPI  Today, she is being contacted for medication management.    Autumn [name] continues multimodal pain regimen as prescribed.  Autumn [name] states that she is having more pain in her thoracic region.  She states that it radiates around her rib cage to her front.  She does have thoracic pathology at [facility].  At her previous pain clinic she did receive a T5-T6 epidural steroid injection and had a DRG trial both of which were unsuccessful.    In regards to medications, Autumn [name] states that she has been breaking her hydrocodone in half so that she can extend her tablet quantity.  I informed the Autumn [name] that we can increase her hydrocodone to 3 times a day so that she can take a full tablet which will may help her  manage her pain better.  She is on  Lyrica 75 mg 3 times a day.  Continue as prescribed.  Monitoring: 04/09/2019  1   01/29/2019  Pregabalin 75 MG Capsule  90.00  30 Bi Lat   [id number]   [place] (0921)   0  1.51 LME  Medicare   [place]  03/30/2019  1   01/29/2019  Hydrocodone-Acetamin 10-325 MG  60.00  30 Bi Lat   [id number]   [place] (0921)   0  20.00 MME  Medicare   [place]    Pharmacotherapy: No side-effects or adverse reactions reported. [place] PMP: PDMP reviewed during this encounter.       Compliance: No problems identified. Effectiveness: Clinically acceptable. Plan: Refer to "POC".  UDS:  Summary  Date Value Ref Range Status  03/22/2017 FINAL  Final    Comment:    ==================================================================== TOXASSURE COMP DRUG ANALYSIS,UR ==================================================================== Test                             Result       Flag       Units Drug Present and Declared for Prescription Verification   Buprenorphine                  23           EXPECTED   ng/mg creat   Norbuprenorphine               112          EXPECTED   ng/mg creat    Source of buprenorphine is a scheduled prescription medication.    Norbuprenorphine is an expected metabolite of buprenorphine.   Gabapentin                     PRESENT      EXPECTED   Sertraline                     PRESENT      EXPECTED   Desmethylsertraline            PRESENT      EXPECTED    Desmethylsertraline is an expected metabolite of sertraline.   Atenolol                       PRESENT      EXPECTED Drug Absent but Declared for Prescription Verification   Hydrocodone                    Not Detected UNEXPECTED ng/mg creat   Tramadol                       Not Detected UNEXPECTED ng/mg creat   Acetaminophen                  Not Detected UNEXPECTED    Acetaminophen, as indicated in the declared medication list, is    not always detected even when used as directed.   Ibuprofen                      Not Detected UNEXPECTED    Ibuprofen, as  indicated in the declared medication list, is not    always detected even when used as directed.   Diphenhydramine                Not Detected UNEXPECTED ====================================================================  Test                      Result    Flag   Units      Ref Range   Creatinine              182              mg/dL      >=20 ==================================================================== Declared Medications:  The flagging and interpretation on this report are based on the  following declared medications.  Unexpected results may arise from  inaccuracies in the declared medications.  **Note: The testing scope of this panel includes these medications:  Atenolol  Buprenorphine  Diphenhydramine  Gabapentin  Hydrocodone (Hydrocodone-Acetaminophen)  Sertraline  Tramadol  **Note: The testing scope of this panel does not include small to  moderate amounts of these reported medications:  Acetaminophen (Hydrocodone-Acetaminophen)  Ibuprofen  **Note: The testing scope of this panel does not include following  reported medications:  Albuterol  Amoxicillin (Augmentin)  Atorvastatin  Azithromycin  Brimonidine Tartrate  Brinzolamide  Calcium (Calcium/Vitamin D3)  Clavulinate (Augmentin)  Fluticasone (Advair)  Latanoprost  Omega-3 Fatty Acids  Ondansetron  Prednisone  Salmeterol (Advair)  Tiotropium  Topical  Vitamin D3 (Calcium/Vitamin D3) ==================================================================== For clinical consultation, please call [phone]. ====================================================================    Laboratory Chemistry Profile (12 mo)  Renal: No results found for requested labs within last 8760 hours.  Lab Results  Component Value Date   GFRAA >60 02/13/2018   GFRNONAA >60 02/13/2018   Hepatic: No results found for requested labs within last 8760 hours. Lab Results  Component Value Date   AST 21 03/29/2015   ALT 20  03/29/2015   Other: No results found for requested labs within last 8760 hours. Note: Above Lab results reviewed.  Imaging  US Abdomen Complete CLINICAL DATA:  Abdominal pain  EXAM: ABDOMEN ULTRASOUND COMPLETE  COMPARISON:  12/16/2013  FINDINGS: Gallbladder: No gallstones or wall thickening visualized. No sonographic Murphy sign noted by sonographer.  Common bile duct: Diameter: 1.5  Liver: No focal lesion identified. Within normal limits in parenchymal echogenicity. Portal vein is patent on color Doppler imaging with normal direction of blood flow towards the liver.  IVC: No abnormality visualized.  Pancreas: Visualized portion unremarkable.  Spleen: Size and appearance within normal limits. Small calcifications in the spleen likely dystrophic.  Right Kidney: Length: 10.9 cm. Echogenicity within normal limits. No mass. Mild hydronephrosis.  Left Kidney: Length: 11 cm. Echogenicity within normal limits. No hydronephrosis. 13 mm anechoic mid pole renal mass most consistent with a cyst.  Abdominal aorta: Abdominal aortic ectasia with atherosclerosis. Abdominal aorta measures 2.5 cm in diameter.  Other findings: None.  IMPRESSION: 1. No cholelithiasis or sonographic evidence of acute cholecystitis. 2. Mild right hydronephrosis. 3. Ectatic abdominal aorta, at risk for aneurysm development. Recommend follow-up aortic ultrasound in 5 years. This recommendation follows ACR consensus guidelines: White Paper of the ACR Incidental Findings Committee II on Vascular Findings. J Am Coll Radiol 2013; 10:789-794.  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 Diagnoses of [name] herpetic neuralgia, Thoracic facet joint syndrome, Chronic use of opiate for therapeutic purpose, Neuropathic pain, and Chronic pain syndrome were also pertinent to this visit.  Plan of Care    I have discontinued Fantasia I. Danker's arformoterol. I am also having her start on HYDROcodone-acetaminophen and HYDROcodone-acetaminophen. Additionally, I am  having her maintain her atenolol, atorvastatin, Calcium 600+D3, Fish Oil, sertraline, denosumab, montelukast, acetaminophen, fluticasone, Propylene Glycol, ipratropium-albuterol, predniSONE, cetirizine, and pregabalin.  Pharmacotherapy (Medications Ordered): Meds ordered this encounter  Medications  . pregabalin (LYRICA) 75 MG capsule    Sig: Take 1 capsule (75 mg total) by mouth 3 (three) times daily.    Dispense:  90 capsule    Refill:  5    Do not place this medication, or any other prescription from our practice, on "Automatic Refill". Autumn [name] may have prescription filled one day early if pharmacy is closed on scheduled refill date.  [name] HYDROcodone-acetaminophen (NORCO) 10-325 MG tablet    Sig: Take 1 tablet by mouth every 8 (eight) hours as needed for severe pain. Must last 30 days.    Dispense:  90 tablet    Refill:  0    Chronic Pain. (STOP Act - Not applicable). Fill one day early if closed on scheduled refill date.  [name] HYDROcodone-acetaminophen (NORCO) 10-325 MG tablet    Sig: Take 1 tablet by mouth every 8 (eight) hours as needed for severe pain. Must last 30 days.    Dispense:  90 tablet    Refill:  0    Chronic Pain. (STOP Act - Not applicable). Fill one day early if closed on scheduled refill date.   Orders:  Orders Placed This Encounter  Procedures  . INTERCOSTAL NERVE BLOCK    Standing Status:   Future    Standing Expiration Date:   10/29/2019    Scheduling Instructions:     Side: LEFT     Sedation: With Sedation.     Timeframe: ASAA    Order Specific Question:   Where will this procedure be performed?    Answer:   [facility]  . ToxASSURE Select 13 (MW), Urine    Volume: 30 ml(s). Minimum 3 ml of urine is needed. Document temperature of fresh sample. Indications: Long term (current) use of opiate  analgesic [id number])   Follow-up plan:   Return in about 1 week (around 05/08/2019) for Left T5, 6, 7 IC NB , with sedation (get UDS same day).    Recent Visits No visits were found meeting these conditions.  Showing recent visits within past 90 days and meeting all other requirements   Today's Visits Date Type Provider Dept  05/01/19 Office Visit Gillis Santa, MD [facility]  Showing today's visits and meeting all other requirements   Future Appointments No visits were found meeting these conditions.  Showing future appointments within next 90 days and meeting all other requirements   I discussed the assessment and treatment plan with the Autumn [name]. The Autumn [name] was provided an opportunity to ask questions and all were answered. The Autumn [name] agreed with the plan and demonstrated an understanding of the instructions.  Autumn [name] advised to call back or seek an in-person evaluation if the symptoms or condition worsens.  Duration of encounter: 46minutes.  Note by: Gillis Santa, MD Date: 05/01/2019; Time: 8:49 AM

## 2019-05-07 ENCOUNTER — Ambulatory Visit: Payer: Medicare PPO | Admitting: Student in an Organized Health Care Education/Training Program

## 2019-05-23 ENCOUNTER — Ambulatory Visit: Payer: Medicare PPO | Admitting: Student in an Organized Health Care Education/Training Program

## 2019-06-18 ENCOUNTER — Other Ambulatory Visit: Payer: Self-pay | Admitting: Internal Medicine

## 2019-06-18 DIAGNOSIS — Z1231 Encounter for screening mammogram for malignant neoplasm of breast: Secondary | ICD-10-CM

## 2019-06-20 ENCOUNTER — Encounter: Payer: Self-pay | Admitting: Student in an Organized Health Care Education/Training Program

## 2019-06-21 ENCOUNTER — Ambulatory Visit
Payer: Medicare PPO | Attending: Student in an Organized Health Care Education/Training Program | Admitting: Student in an Organized Health Care Education/Training Program

## 2019-06-21 ENCOUNTER — Other Ambulatory Visit: Payer: Self-pay

## 2019-06-21 ENCOUNTER — Encounter: Payer: Self-pay | Admitting: Student in an Organized Health Care Education/Training Program

## 2019-06-21 DIAGNOSIS — M47894 Other spondylosis, thoracic region: Secondary | ICD-10-CM

## 2019-06-21 DIAGNOSIS — M792 Neuralgia and neuritis, unspecified: Secondary | ICD-10-CM

## 2019-06-21 DIAGNOSIS — Z79891 Long term (current) use of opiate analgesic: Secondary | ICD-10-CM

## 2019-06-21 DIAGNOSIS — B0229 Other postherpetic nervous system involvement: Secondary | ICD-10-CM

## 2019-06-21 NOTE — Progress Notes (Signed)
Patient: Autumn [name]  Service Category: E/M  Provider: Gillis Santa, MD  DOB: Sep 07, 1945  DOS: 06/21/2019  Location: Office  MRN: [id number]  Setting: Ambulatory outpatient  Referring Provider: Baxter Hire, MD  Type: Established Patient  Specialty: Interventional Pain Management  PCP: Baxter Hire, MD  Location: Home  Delivery: TeleHealth     Virtual Encounter - Pain Management PROVIDER NOTE: Information contained herein reflects review and annotations entered in association with encounter. Interpretation of such information and data should be left to medically-trained personnel. Information provided to patient can be located elsewhere in the medical record under "Patient Instructions". Document created using STT-dictation technology, any transcriptional errors that may result from process are unintentional.    Contact & Pharmacy Preferred: [phone] Home: [phone] (home) Mobile: [phone] (mobile) E-mail: [email or web address]  [id number] [place] [facility] [address] [id number]: [phone]: [phone]  Pre-screening  Autumn [name] Postoffered "in-person" vs "virtual" encounter. She indicated preferring virtual for this encounter.   Reason COVID-19*  Social distancing based on CDC and AMA recommendations.   I contacted Autumn [name] 06/21/2019 via telephone.      I clearly identified myself as BGillis Santa MD. I verified that I was speaking with the correct person using two identifiers (Name: Autumn [name] and date of birth: 118-Nov-1947.  This visit was completed via telephone due to the restrictions of the COVID-19 pandemic. All issues as above were discussed and addressed but no physical exam was performed. If it was felt that the patient should be evaluated in the office, they were directed there. The patient verbally consented to this visit. Patient was unable to complete an audio/visual visit due to Technical  difficulties and/or Lack of internet. Due to the catastrophic nature of the COVID-19 pandemic, this visit was done through audio contact only.  Location of the patient: home address (see Epic for details)  Location of the provider: office  Consent I sought verbal advanced consent from Autumn Spitzfor virtual visit interactions. I informed Ms. CWurtzof possible security and privacy concerns, risks, and limitations associated with providing "not-in-person" medical evaluation and management services. I also informed Ms. CGarryof the availability of "in-person" appointments. Finally, I informed her that there would be a charge for the virtual visit and that she could be  personally, fully or partially, financially responsible for it. Ms. CRenderosexpressed understanding and agreed to proceed.   Historic Elements   Ms. HFLO BERROAis a 74y.o. year old, female patient evaluated today after her last contact with our practice on 05/01/2019. Autumn [name] has a past medical history of Allergy, COPD ([place] obstructive pulmonary disease) [place], Glaucoma, Hypertension, Osteoporosis, [place], and Sarcoid. She also  has a past surgical history that includes Abdominal hysterectomy and Breast excisional biopsy ([place]). Ms. CLacertehas a current medication list which includes the following prescription(s): acetaminophen, atenolol, atorvastatin, cetirizine, denosumab, fluticasone, hydrocodone-acetaminophen, ipratropium-albuterol, montelukast, prednisone, pregabalin, propylene glycol, sertraline, calcium 600+d3, and fish oil. She  reports that she has never smoked. She has never used smokeless tobacco. She reports that she does not drink alcohol or use drugs. Ms. CBrevikis allergic to ivp dye [iodinated diagnostic agents].   HPI  Today, she is being contacted for medication management.   Is having increased pain in her thoracic spine related to intercostal neuralgia.  She is scheduled for intercostal  nerve block in early  April.  In regards to medications.  I recommend the patient modify her hydrocodone so that she has taken 15 mg twice a day rather than 10 mg 3 times daily.  Patient has not been taking the full dose which is why her pain management is not great.  She continues her Lyrica as prescribed.  Patient endorsed understanding. UDS:  Summary  Date Value Ref Range Status  03/22/2017 FINAL  Final    Comment:    ==================================================================== TOXASSURE COMP DRUG ANALYSIS,UR ==================================================================== Test                             Result       Flag       Units Drug Present and Declared for Prescription Verification   Buprenorphine                  23           EXPECTED   ng/mg creat   Norbuprenorphine               112          EXPECTED   ng/mg creat    Source of buprenorphine is a scheduled prescription medication.    Norbuprenorphine is an expected metabolite of buprenorphine.   Gabapentin                     PRESENT      EXPECTED   Sertraline                     PRESENT      EXPECTED   Desmethylsertraline            PRESENT      EXPECTED    Desmethylsertraline is an expected metabolite of sertraline.   Atenolol                       PRESENT      EXPECTED Drug Absent but Declared for Prescription Verification   Hydrocodone                    Not Detected UNEXPECTED ng/mg creat   Tramadol                       Not Detected UNEXPECTED ng/mg creat   Acetaminophen                  Not Detected UNEXPECTED    Acetaminophen, as indicated in the declared medication list, is    not always detected even when used as directed.   Ibuprofen                      Not Detected UNEXPECTED    Ibuprofen, as indicated in the declared medication list, is not    always detected even when used as directed.   Diphenhydramine                Not Detected  UNEXPECTED ==================================================================== Test                      Result    Flag   Units      Ref Range   Creatinine              182  mg/dL      >=20 ==================================================================== Declared Medications:  The flagging and interpretation on this report are based on the  following declared medications.  Unexpected results may arise from  inaccuracies in the declared medications.  **Note: The testing scope of this panel includes these medications:  Atenolol  Buprenorphine  Diphenhydramine  Gabapentin  Hydrocodone (Hydrocodone-Acetaminophen)  Sertraline  Tramadol  **Note: The testing scope of this panel does not include small to  moderate amounts of these reported medications:  Acetaminophen (Hydrocodone-Acetaminophen)  Ibuprofen  **Note: The testing scope of this panel does not include following  reported medications:  Albuterol  Amoxicillin (Augmentin)  Atorvastatin  Azithromycin  Brimonidine Tartrate  Brinzolamide  Calcium (Calcium/Vitamin D3)  Clavulinate (Augmentin)  Fluticasone (Advair)  Latanoprost  Omega-3 Fatty Acids  Ondansetron  Prednisone  Salmeterol (Advair)  Tiotropium  Topical  Vitamin D3 (Calcium/Vitamin D3) ==================================================================== For clinical consultation, please call [phone]. ====================================================================    Laboratory Chemistry Profile   Renal Lab Results  Component Value Date   BUN 28 (H) 02/13/2018   CREATININE 0.68 02/13/2018   GFRAA >60 02/13/2018   GFRNONAA >60 02/13/2018    Hepatic Lab Results  Component Value Date   AST 21 03/29/2015   ALT 20 03/29/2015   ALBUMIN 4.4 03/29/2015   ALKPHOS 46 03/29/2015   LIPASE 116 12/15/2013    Electrolytes Lab Results  Component Value Date   NA 145 02/13/2018   K 4.6 02/13/2018   CL 100 02/13/2018   CALCIUM 10.0  02/13/2018   MG 2.7 (H) 02/07/2018   PHOS 3.2 02/07/2018    Bone No results found for: VD25OH, VD125OH2TOT, VP7106YI9, SW5462VO3, 25OHVITD1, 25OHVITD2, 25OHVITD3, TESTOFREE, TESTOSTERONE  Inflammation (CRP: Acute Phase) (ESR: Chronic Phase) Lab Results  Component Value Date   ESRSEDRATE 48 (H) 05/25/2011      Note: Above Lab results reviewed.  Imaging  US Abdomen Complete CLINICAL DATA:  Abdominal pain  EXAM: ABDOMEN ULTRASOUND COMPLETE  COMPARISON:  12/16/2013  FINDINGS: Gallbladder: No gallstones or wall thickening visualized. No sonographic Murphy sign noted by sonographer.  Common bile duct: Diameter: 1.5  Liver: No focal lesion identified. Within normal limits in parenchymal echogenicity. Portal vein is patent on color Doppler imaging with normal direction of blood flow towards the liver.  IVC: No abnormality visualized.  Pancreas: Visualized portion unremarkable.  Spleen: Size and appearance within normal limits. Small calcifications in the spleen likely dystrophic.  Right Kidney: Length: 10.9 cm. Echogenicity within normal limits. No mass. Mild hydronephrosis.  Left Kidney: Length: 11 cm. Echogenicity within normal limits. No hydronephrosis. 13 mm anechoic mid pole renal mass most consistent with a cyst.  Abdominal aorta: Abdominal aortic ectasia with atherosclerosis. Abdominal aorta measures 2.5 cm in diameter.  Other findings: None.  IMPRESSION: 1. No cholelithiasis or sonographic evidence of acute cholecystitis. 2. Mild right hydronephrosis. 3. Ectatic abdominal aorta, at risk for aneurysm development. Recommend follow-up aortic ultrasound in 5 years. This recommendation follows ACR consensus guidelines: White Paper of the ACR Incidental Findings Committee II on Vascular Findings. J Am Coll Radiol 2013; 10:789-794.  Electronically Signed   By: Kathreen Devoid   On: 09/11/2018 10:16  Assessment  The primary encounter diagnosis was Postinfective  intercostal neuralgia. Diagnoses of Post herpetic neuralgia, Thoracic facet joint syndrome, Chronic use of opiate for therapeutic purpose, and Neuropathic pain were also pertinent to this visit.  Plan of Care  Ms. LEELAH HANNA has a current medication list which includes the following long-term medication(s): atenolol, atorvastatin,  cetirizine, fluticasone, ipratropium-albuterol, montelukast, pregabalin, sertraline, and calcium 600+d3.  1.  Increase hydrocodone as indicated above to 15 mg twice daily 2.  Continue Lyrica as prescribed 3.  Follow-up next month for intercostal nerve block   Follow-up plan:   Return for Keep sch. appt.    Recent Visits Date Type Provider Dept  05/01/19 Office Visit Gillis Santa, MD [facility]  Showing recent visits within past 90 days and meeting all other requirements   Today's Visits Date Type Provider Dept  06/21/19 Office Visit Gillis Santa, MD [facility]  Showing today's visits and meeting all other requirements   Future Appointments Date Type Provider Dept  07/23/19 Appointment Gillis Santa, MD [facility]  Showing future appointments within next 90 days and meeting all other requirements   I discussed the assessment and treatment plan with the patient. The patient was provided an opportunity to ask questions and all were answered. The patient agreed with the plan and demonstrated an understanding of the instructions.  Patient advised to call back or seek an in-person evaluation if the symptoms or condition worsens.   Duration of encounter: 1mnutes.  Note by: BGillis Santa MD Date: 06/21/2019; Time: 11:48 AM

## 2019-06-28 ENCOUNTER — Encounter: Payer: Medicare PPO | Admitting: Student in an Organized Health Care Education/Training Program

## 2019-07-23 ENCOUNTER — Ambulatory Visit (HOSPITAL_BASED_OUTPATIENT_CLINIC_OR_DEPARTMENT_OTHER): Payer: Medicare PPO | Admitting: Student in an Organized Health Care Education/Training Program

## 2019-07-23 ENCOUNTER — Encounter: Payer: Self-pay | Admitting: Student in an Organized Health Care Education/Training Program

## 2019-07-23 ENCOUNTER — Other Ambulatory Visit: Payer: Self-pay

## 2019-07-23 ENCOUNTER — Ambulatory Visit
Admission: RE | Admit: 2019-07-23 | Discharge: 2019-07-23 | Disposition: A | Discharge status: Home / Self Care | Payer: Medicare PPO | Source: Ambulatory Visit | Attending: Student in an Organized Health Care Education/Training Program | Admitting: Student in an Organized Health Care Education/Training Program

## 2019-07-23 VITALS — BP 148/82 | HR 68 | Temp 98.0°F | Resp 17 | Ht 65.0 in | Wt 137.0 lb

## 2019-07-23 DIAGNOSIS — G894 Chronic pain syndrome: Secondary | ICD-10-CM

## 2019-07-23 DIAGNOSIS — M792 Neuralgia and neuritis, unspecified: Secondary | ICD-10-CM | POA: Insufficient documentation

## 2019-07-23 MED ORDER — IOHEXOL 180 MG/ML  SOLN
10.0000 mL | Freq: Once | INTRAMUSCULAR | Status: AC
  Administered 2019-07-23: 10 mL via EPIDURAL
  Filled 2019-07-23: qty 20

## 2019-07-23 MED ORDER — DEXAMETHASONE SODIUM PHOSPHATE 10 MG/ML IJ SOLN
10.0000 mg | Freq: Once | INTRAMUSCULAR | Status: AC
  Administered 2019-07-23: 10 mg
  Filled 2019-07-23: qty 1

## 2019-07-23 MED ORDER — LIDOCAINE HCL 2 % IJ SOLN
20.0000 mL | Freq: Once | INTRAMUSCULAR | Status: AC
  Administered 2019-07-23: 400 mg
  Filled 2019-07-23: qty 20

## 2019-07-23 MED ORDER — FENTANYL CITRATE (PF) 100 MCG/2ML IJ SOLN
25.0000 ug | INTRAMUSCULAR | Status: DC | PRN
  Administered 2019-07-23: 25 ug via INTRAVENOUS
  Filled 2019-07-23: qty 2

## 2019-07-23 MED ORDER — ROPIVACAINE HCL 2 MG/ML IJ SOLN
9.0000 mL | Freq: Once | INTRAMUSCULAR | Status: AC
  Administered 2019-07-23: 9 mL via PERINEURAL
  Filled 2019-07-23: qty 10

## 2019-07-23 NOTE — Progress Notes (Signed)
Safety precautions to be maintained throughout the outpatient stay will include: orient to surroundings, keep bed in low position, maintain call bell within reach at all times, provide assistance with transfer out of bed and ambulation.  

## 2019-07-23 NOTE — Patient Instructions (Signed)

## 2019-07-23 NOTE — Progress Notes (Addendum)
PROVIDER NOTE: Information contained herein reflects review and annotations entered in association with encounter. Interpretation of such information and data should be left to medically-trained personnel. Information provided to patient can be located elsewhere in the medical record under "Patient Instructions". Document created using STT-dictation technology, any transcriptional errors that may result from process are unintentional.    Patient: Autumn [name]  Service Category: Procedure  Provider: Gillis Santa, MD  DOB: [birth year]  DOS: 07/23/2019  Location: [facility]  MRN: [id number]  Setting: Ambulatory - outpatient  Referring Provider: Gillis Santa, MD  Type: Established Patient  Specialty: Interventional Pain Management  PCP: Autumn Hire, MD   Primary Reason for Visit: Interventional Pain Management Treatment. CC: Back Pain (left) and Chest Pain (left)  Procedure:          Anesthesia, Analgesia, Anxiolysis:  Type: Diagnostic Posterior Intercostal  Nerve Block           Region: Posterior  Thoracic Area Level: T9, T10, T11 Ribs Laterality: Left-Sided  Type: Moderate (Conscious) Sedation combined with Local Anesthesia Indication(s): Analgesia and Anxiety Route: Intravenous (IV) IV Access: Secured Sedation: Meaningful verbal contact was maintained at all times during the procedure  Local Anesthetic: Lidocaine 1-2%  Position: Prone   Indications: 1. Chronic pain syndrome   2. Postinfective intercostal neuralgia    Pain Score: Pre-procedure: 8 /10 Post-procedure: 8 /10   Pre-op Assessment:  Autumn [name] is a 74 y.o. (year old), female patient, seen today for interventional treatment. She  has a past surgical history that includes Abdominal hysterectomy and Breast excisional biopsy (Right). Autumn [name] has a current medication list which includes the following prescription(s): acetaminophen, atenolol, atorvastatin, calcium 600+d3, cetirizine, denosumab,  fluticasone, ipratropium-albuterol, montelukast, fish oil, prednisone, pregabalin, propylene glycol, sertraline, and hydrocodone-acetaminophen. Her primarily concern today is the Back Pain (left) and Chest Pain (left)  Initial Vital Signs:  Pulse/HCG Rate: 67ECG Heart Rate: 70 Temp: 98.1 F (36.7 C) Resp: 16 BP: (!) 159/65 SpO2: 98 %(O2 at 2L/min)  BMI: Estimated body mass index is 22.8 kg/m as calculated from the following:   Height as of this encounter: 5\' 5"  (1.651 m).   Weight as of this encounter: 137 lb (62.1 kg).  Risk Assessment: Allergies: Reviewed. She is allergic to ivp dye [iodinated diagnostic agents].  Allergy Precautions: None required Coagulopathies: Reviewed. None identified.  Blood-thinner therapy: None at this time Active Infection(s): Reviewed. None identified. Autumn [name] is afebrile  Site Confirmation: Autumn [name] was asked to confirm the procedure and laterality before marking the site Procedure checklist: Completed Consent: Before the procedure and under the influence of no sedative(s), amnesic(s), or anxiolytics, the patient was informed of the treatment options, risks and possible complications. To fulfill our ethical and legal obligations, as recommended by the American Medical Association's Code of Ethics, I have informed the patient of my clinical impression; the nature and purpose of the treatment or procedure; the risks, benefits, and possible complications of the intervention; the alternatives, including doing nothing; the risk(s) and benefit(s) of the alternative treatment(s) or procedure(s); and the risk(s) and benefit(s) of doing nothing. The patient was provided information about the general risks and possible complications associated with the procedure. These may include, but are not limited to: failure to achieve desired goals, infection, bleeding, organ or nerve damage, allergic reactions, paralysis, and death. In addition, the patient was informed of  those risks and complications associated to the procedure, such as failure to decrease pain; infection; bleeding; organ or nerve  damage with subsequent damage to sensory, motor, and/or autonomic systems, resulting in permanent pain, numbness, and/or weakness of one or several areas of the body; allergic reactions; (i.e.: anaphylactic reaction); and/or death. Furthermore, the patient was informed of those risks and complications associated with the medications. These include, but are not limited to: allergic reactions (i.e.: anaphylactic or anaphylactoid reaction(s)); adrenal axis suppression; blood sugar elevation that in diabetics may result in ketoacidosis or comma; water retention that in patients with history of congestive heart failure may result in shortness of breath, pulmonary edema, and decompensation with resultant heart failure; weight gain; swelling or edema; medication-induced neural toxicity; particulate matter embolism and blood vessel occlusion with resultant organ, and/or nervous system infarction; and/or aseptic necrosis of one or more joints. Finally, the patient was informed that Medicine is not an exact science; therefore, there is also the possibility of unforeseen or unpredictable risks and/or possible complications that may result in a catastrophic outcome. The patient indicated having understood very clearly. We have given the patient no guarantees and we have made no promises. Enough time was given to the patient to ask questions, all of which were answered to the patient's satisfaction. Autumn [name] has indicated that she wanted to continue with the procedure. Attestation: I, the ordering provider, attest that I have discussed with the patient the benefits, risks, side-effects, alternatives, likelihood of achieving goals, and potential problems during recovery for the procedure that I have provided informed consent. Date  Time: 07/23/2019 11:00 AM  Pre-Procedure Preparation:    Monitoring: As per clinic protocol. Respiration, ETCO2, SpO2, BP, heart rate and rhythm monitor placed and checked for adequate function Safety Precautions: Patient was assessed for positional comfort and pressure points before starting the procedure. Time-out: I initiated and conducted the "Time-out" before starting the procedure, as per protocol. The patient was asked to participate by confirming the accuracy of the "Time Out" information. Verification of the correct person, site, and procedure were performed and confirmed by me, the nursing staff, and the patient. "Time-out" conducted as per Joint Commission's Universal Protocol (UP.01.01.01). Time: 1158  Description of Procedure:          Target Area: The sub-costal neurovascular bundle area Approach: Sub-costal approach Area Prepped: Entire Posterolateral  Thoracic Region DuraPrep (Iodine Povacrylex [0.7% available iodine] and Isopropyl Alcohol, 74% w/w) Safety Precautions: Aspiration looking for blood return was conducted prior to all injections. At no point did we inject any substances, as a needle was being advanced. No attempts were made at seeking any paresthesias. Safe injection practices and needle disposal techniques used. Medications properly checked for expiration dates. SDV (single dose vial) medications used. Description of the Procedure: Protocol guidelines were followed. The patient was placed in position over the procedure table. The target area was identified and the area prepped in the usual manner. Skin & deeper tissues infiltrated with local anesthetic. After cleaning the skin with an antiseptic solution, 1-2 mL of dilute local anesthetic was infiltrated subcutaneously at the planned injection site. The fingers of the palpating hand were used to straddle the insertion site at the inferior border of the rib and fix the skin to avoid unwanted skin movement. Appropriate amount of time allowed to pass for local anesthetics to take  effect. The procedure needles were then advanced to the target area at an angle of approximately 20 cephalad to the skin. Contact with the rib was made. While maintaining the same angle of insertion, the needle was walked off the inferior border of the  rib as the skin was allowed to return to its initial position. Then the needle was advanced no more than 3 mm below the inferior margin of the rib. Proper needle placement was secured. Negative aspiration confirmed. Following negative aspiration for blood or air, 3-5 mL of local anesthetic was injected. The solution injected in intermittent fashion, asking for systemic symptoms every 0.5cc of injectate. The needle was then removed and the area cleansed, making sure to leave some of the prepping solution back to take advantage of its long term bactericidal properties. Vitals:   07/23/19 1205 07/23/19 1210 07/23/19 1218 07/23/19 1229  BP: (!) 170/85 (!) 173/79 (!) 164/72 (!) 148/82  Pulse: 67 68    Resp: 13 15 15 17   Temp:   98 F (36.7 C)   TempSrc:      SpO2: 100% 100% 100% 98%  Weight:      Height:        Start Time: 1159 hrs. End Time: 1211 hrs.   Materials:  Needle(s) Type: Spinal needle Gauge: 25G Length: 3.5-in Medication(s): Please see orders for medications and dosing details. 5 cc solution made of 4 cc of 0.2% ropivacaine, 1 cc of Decadron 10 mg/cc.  1.5 cc injected at each level above for each intercostal nerve. Imaging Guidance (Non-Spinal):          Type of Imaging Technique: Fluoroscopy Guidance (Non-Spinal) Indication(s): Assistance in needle guidance and placement for procedures requiring needle placement in or near specific anatomical locations not easily accessible without such assistance. Exposure Time: Please see nurses notes. Contrast: Before injecting any contrast, we confirmed that the patient did not have an allergy to iodine, shellfish, or radiological contrast. Once satisfactory needle placement was completed at the  desired level, radiological contrast was injected. Contrast injected under live fluoroscopy. No contrast complications. See chart for type and volume of contrast used. Fluoroscopic Guidance: I was personally present during the use of fluoroscopy. "Tunnel Vision Technique" used to obtain the best possible view of the target area. Parallax error corrected before commencing the procedure. "Direction-depth-direction" technique used to introduce the needle under continuous pulsed fluoroscopy. Once target was reached, antero-posterior, oblique, and lateral fluoroscopic projection used confirm needle placement in all planes. Images permanently stored in EMR. Interpretation: I personally interpreted the imaging intraoperatively. Adequate needle placement confirmed in multiple planes. Appropriate spread of contrast into desired area was observed. No evidence of afferent or efferent intravascular uptake. Permanent images saved into the patient's record.  Antibiotic Prophylaxis:   Anti-infectives (From admission, onward)   None     Indication(s): None identified  Post-operative Assessment:  Post-procedure Vital Signs:  Pulse/HCG Rate: 6868 Temp: 98 F (36.7 C) Resp: 17 BP: (!) 148/82 SpO2: 98 %  EBL: None  Complications: No immediate post-treatment complications observed by team, or reported by patient.  Note: The patient tolerated the entire procedure well. A repeat set of vitals were taken after the procedure and the patient was kept under observation following institutional policy, for this type of procedure. Post-procedural neurological assessment was performed, showing return to baseline, prior to discharge. The patient was provided with post-procedure discharge instructions, including a section on how to identify potential problems. Should any problems arise concerning this procedure, the patient was given instructions to immediately contact us, at any time, without hesitation. In any case, we  plan to contact the patient by telephone for a follow-up status report regarding this interventional procedure.  Comments:  No additional relevant information.  Plan of Care  Orders:  Orders Placed This Encounter  Procedures  . DG PAIN [facility] C-ARM 1-60 MIN NO REPORT    Intraoperative interpretation by procedural physician at [place].    Standing Status:   Standing    Number of Occurrences:   1    Order Specific Question:   Reason for exam:    Answer:   Assistance in needle guidance and placement for procedures requiring needle placement in or near specific anatomical locations not easily accessible without such assistance.   Medications ordered for procedure: Meds ordered this encounter  Medications  . iohexol (OMNIPAQUE) 180 MG/ML injection 10 mL    Must be Myelogram-compatible. If not available, you may substitute with a water-soluble, non-ionic, hypoallergenic, myelogram-compatible radiological contrast medium.  . DISCONTD: fentaNYL (SUBLIMAZE) injection 25-50 mcg    Make sure Narcan is available in the pyxis when using this medication. In the event of respiratory depression (RR< 8/min): Titrate NARCAN (naloxone) in increments of 0.1 to 0.2 mg IV at 2-3 minute intervals, until desired degree of reversal.  . lidocaine (XYLOCAINE) 2 % (with pres) injection 400 mg  . ropivacaine (PF) 2 mg/mL (0.2%) (NAROPIN) injection 9 mL  . dexamethasone (DECADRON) injection 10 mg  . HYDROcodone-acetaminophen (NORCO) 10-325 MG tablet    Sig: Take 1 tablet by mouth every 8 (eight) hours as needed for severe pain. Must last 30 days.    Dispense:  90 tablet    Refill:  0    Chronic Pain. (STOP Act - Not applicable). Fill one day early if closed on scheduled refill date.  PMP checked and appropriate.  Medications administered: We administered iohexol, fentaNYL, lidocaine, ropivacaine (PF) 2 mg/mL (0.2%), and dexamethasone.  See the medical record for exact dosing, route, and time of  administration.  Follow-up plan:   Return in about 24 days (around 08/16/2019) for Post Procedure Evaluation.     Recent Visits Date Type Provider Dept  07/23/19 Procedure visit Gillis Santa, MD [facility]  06/21/19 Office Visit Gillis Santa, MD [facility]  05/01/19 Office Visit Gillis Santa, MD [facility]  Showing recent visits within past 90 days and meeting all other requirements   Future Appointments Date Type Provider Dept  08/16/19 Appointment Gillis Santa, MD [facility]  Showing future appointments within next 90 days and meeting all other requirements   Disposition: Discharge home  Discharge (Date  Time): 07/23/2019;   hrs.   Primary Care Physician: Autumn Hire, MD Location: [facility] Note by: Gillis Santa, MD Date: 07/23/2019; Time: 11:36 AM  Disclaimer:  Medicine is not an exact science. The only guarantee in medicine is that nothing is guaranteed. It is important to note that the decision to proceed with this intervention was based on the information collected from the patient. The Data and conclusions were drawn from the patient's questionnaire, the interview, and the physical examination. Because the information was provided in large part by the patient, it cannot be guaranteed that it has not been purposely or unconsciously manipulated. Every effort has been made to obtain as much relevant data as possible for this evaluation. It is important to note that the conclusions that lead to this procedure are derived in large part from the available data. Always take into account that the treatment will also be dependent on availability of resources and existing treatment guidelines, considered by other Pain Management Practitioners as being common knowledge and practice, at the time of the intervention. For  Medico-Legal purposes, it is also important to point out that variation in procedural techniques  and pharmacological choices are the acceptable norm. The indications, contraindications, technique, and results of the above procedure should only be interpreted and judged by a Board-Certified Interventional Pain Specialist with extensive familiarity and expertise in the same exact procedure and technique.

## 2019-07-24 ENCOUNTER — Telehealth: Payer: Self-pay | Admitting: Student in an Organized Health Care Education/Training Program

## 2019-07-24 ENCOUNTER — Telehealth: Payer: Self-pay | Admitting: *Deleted

## 2019-07-24 MED ORDER — HYDROCODONE-ACETAMINOPHEN 10-325 MG PO TABS: 1.0000 | ORAL_TABLET | Freq: Three times a day (TID) | ORAL | 0 refills | Status: DC | PRN

## 2019-07-24 NOTE — Addendum Note (Signed)
Addended by: Gillis Santa on: 07/24/2019 11:36 AM   Modules accepted: Orders

## 2019-07-24 NOTE — Telephone Encounter (Signed)
No problems post procedure. 

## 2019-07-24 NOTE — Telephone Encounter (Signed)
Last Hydrocodone script 05-31-19.

## 2019-07-24 NOTE — Telephone Encounter (Signed)
Patient needs scripts for hydrocodone/ she just had appt in March for meds mgmt. Also had procedure yesterday and wants to know should she continue her bio freeze while she is healing from injections. Please call pateint.

## 2019-07-26 ENCOUNTER — Other Ambulatory Visit
Admission: RE | Admit: 2019-07-26 | Discharge: 2019-07-26 | Disposition: A | Discharge status: Home / Self Care | Payer: Medicare PPO | Source: Ambulatory Visit | Attending: Internal Medicine | Admitting: Internal Medicine

## 2019-07-26 DIAGNOSIS — Z01812 Encounter for preprocedural laboratory examination: Secondary | ICD-10-CM | POA: Diagnosis present

## 2019-07-26 DIAGNOSIS — Z20822 Contact with and (suspected) exposure to covid-19: Secondary | ICD-10-CM | POA: Diagnosis not present

## 2019-07-26 LAB — SARS CORONAVIRUS 2 (TAT 6-24 HRS): SARS Coronavirus 2: NEGATIVE

## 2019-07-27 ENCOUNTER — Encounter: Payer: Self-pay | Admitting: Internal Medicine

## 2019-07-30 ENCOUNTER — Ambulatory Visit
Admission: RE | Admit: 2019-07-30 | Discharge: 2019-07-30 | Disposition: A | Discharge status: Home / Self Care | Payer: Medicare PPO | Attending: Internal Medicine | Admitting: Internal Medicine

## 2019-07-30 ENCOUNTER — Ambulatory Visit: Payer: Medicare PPO | Admitting: Anesthesiology

## 2019-07-30 ENCOUNTER — Encounter
Admission: RE | Disposition: A | Discharge status: Home / Self Care | Payer: Self-pay | Source: Home / Self Care | Attending: Internal Medicine

## 2019-07-30 ENCOUNTER — Encounter: Payer: Self-pay | Admitting: Internal Medicine

## 2019-07-30 DIAGNOSIS — Z1211 Encounter for screening for malignant neoplasm of colon: Secondary | ICD-10-CM | POA: Insufficient documentation

## 2019-07-30 DIAGNOSIS — B0229 Other postherpetic nervous system involvement: Secondary | ICD-10-CM | POA: Diagnosis not present

## 2019-07-30 DIAGNOSIS — Z7952 Long term (current) use of systemic steroids: Secondary | ICD-10-CM | POA: Insufficient documentation

## 2019-07-30 DIAGNOSIS — H409 Unspecified glaucoma: Secondary | ICD-10-CM | POA: Diagnosis not present

## 2019-07-30 DIAGNOSIS — R0602 Shortness of breath: Secondary | ICD-10-CM | POA: Diagnosis not present

## 2019-07-30 DIAGNOSIS — D860 Sarcoidosis of lung: Secondary | ICD-10-CM | POA: Insufficient documentation

## 2019-07-30 DIAGNOSIS — Z79899 Other long term (current) drug therapy: Secondary | ICD-10-CM | POA: Diagnosis not present

## 2019-07-30 DIAGNOSIS — D123 Benign neoplasm of transverse colon: Secondary | ICD-10-CM | POA: Diagnosis not present

## 2019-07-30 DIAGNOSIS — Z8543 Personal history of malignant neoplasm of ovary: Secondary | ICD-10-CM | POA: Diagnosis not present

## 2019-07-30 DIAGNOSIS — D649 Anemia, unspecified: Secondary | ICD-10-CM | POA: Diagnosis not present

## 2019-07-30 DIAGNOSIS — J449 Chronic obstructive pulmonary disease, unspecified: Secondary | ICD-10-CM | POA: Diagnosis not present

## 2019-07-30 DIAGNOSIS — Z91041 Radiographic dye allergy status: Secondary | ICD-10-CM | POA: Diagnosis not present

## 2019-07-30 DIAGNOSIS — D122 Benign neoplasm of ascending colon: Secondary | ICD-10-CM | POA: Diagnosis not present

## 2019-07-30 DIAGNOSIS — G709 Myoneural disorder, unspecified: Secondary | ICD-10-CM | POA: Diagnosis not present

## 2019-07-30 DIAGNOSIS — Z9981 Dependence on supplemental oxygen: Secondary | ICD-10-CM | POA: Diagnosis not present

## 2019-07-30 DIAGNOSIS — I10 Essential (primary) hypertension: Secondary | ICD-10-CM | POA: Diagnosis not present

## 2019-07-30 HISTORY — DX: Sarcoidosis of lung: D86.0

## 2019-07-30 HISTORY — DX: Acquired absence of other organs: Z90.89

## 2019-07-30 HISTORY — DX: Other postherpetic nervous system involvement: B02.29

## 2019-07-30 HISTORY — DX: Anemia, unspecified: D64.9

## 2019-07-30 HISTORY — PX: COLONOSCOPY WITH PROPOFOL: SHX5780

## 2019-07-30 SURGERY — COLONOSCOPY WITH PROPOFOL
Anesthesia: General

## 2019-07-30 MED ORDER — ONDANSETRON HCL 4 MG/2ML IJ SOLN: 4.0000 mg | Freq: Once | INTRAMUSCULAR | Status: DC | PRN

## 2019-07-30 MED ORDER — FENTANYL CITRATE (PF) 100 MCG/2ML IJ SOLN: 25.0000 ug | INTRAMUSCULAR | Status: DC | PRN

## 2019-07-30 MED ORDER — PROPOFOL 10 MG/ML IV BOLUS
INTRAVENOUS | Status: DC | PRN
  Administered 2019-07-30: 50 mg via INTRAVENOUS

## 2019-07-30 MED ORDER — PROPOFOL 500 MG/50ML IV EMUL
INTRAVENOUS | Status: DC | PRN
  Administered 2019-07-30: 150 ug/kg/min via INTRAVENOUS

## 2019-07-30 MED ORDER — SODIUM CHLORIDE 0.9 % IV SOLN: INTRAVENOUS | Status: DC

## 2019-07-30 MED ORDER — SODIUM CHLORIDE 0.9 % IV SOLN: INTRAVENOUS | Status: DC | PRN

## 2019-07-30 NOTE — Interval H&P Note (Signed)
History and Physical Interval Note:  07/30/2019 1:21 PM  Autumn [name]  has presented today for surgery, with the diagnosis of CCA SCREENING.  The various methods of treatment have been discussed with the patient and family. After consideration of risks, benefits and other options for treatment, the patient has consented to  Procedure(s): COLONOSCOPY WITH PROPOFOL (N/A) as a surgical intervention.  The patient's history has been reviewed, patient examined, no change in status, stable for surgery.  I have reviewed the patient's chart and labs.  Questions were answered to the patient's satisfaction.     [place], [place]

## 2019-07-30 NOTE — Anesthesia Preprocedure Evaluation (Signed)
Anesthesia Evaluation  Patient identified by MRN, date of birth, ID band Patient awake    Reviewed: Allergy & Precautions, NPO status , Patient's Chart, lab work & pertinent test results  Airway Mallampati: II  TM Distance: <3 FB     Dental  (+) Caps   Pulmonary shortness of breath and with exertion, pneumonia, resolved, COPD,  COPD inhaler and oxygen dependent,           Cardiovascular hypertension, Pt. on medications and Pt. on home beta blockers Normal cardiovascular exam     Neuro/Psych  Neuromuscular disease negative psych ROS   GI/Hepatic Neg liver ROS,   Endo/Other  negative endocrine ROS  Renal/GU negative Renal ROS  Female GU complaint     Musculoskeletal   Abdominal Normal abdominal exam  (+)   Peds negative pediatric ROS (+)  Hematology  (+) anemia ,   Anesthesia Other Findings Past Medical History: No date: Allergy No date: Anemia No date: COPD (chronic obstructive pulmonary disease) (HCC) No date: Glaucoma No date: H/O uvulectomy No date: Hypertension No date: Osteoporosis No date: Ovarian cancer ([place]) No date: Postherpetic neuralgia 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) No date: Sarcoid  Reproductive/Obstetrics                             Anesthesia Physical Anesthesia Plan  ASA: III  Anesthesia Plan: General   Post-op Pain Management:    Induction: Intravenous  PONV Risk Score and Plan:   Airway Management Planned: Nasal Cannula  Additional Equipment:   Intra-op Plan:   Post-operative Plan:   Informed Consent: I have reviewed the patients History and Physical, chart, labs and discussed the procedure including the risks, benefits and alternatives for the proposed anesthesia with the patient or authorized representative who has indicated his/her understanding and acceptance.     Dental advisory given  Plan Discussed with: CRNA and  Surgeon  Anesthesia Plan Comments:         Anesthesia Quick Evaluation

## 2019-07-30 NOTE — Op Note (Signed)
[facility] Gastroenterology Patient Name: Autumn [name] Procedure Date: 07/30/2019 1:23 PM MRN: [id number] Account #: [id number] Date of Birth: 1946-01-06 Admit Type: Outpatient Age: 74 Room: [facility] ENDO ROOM 3 Gender: Female Note Status: Finalized Procedure:             Colonoscopy Indications:           Screening for colorectal malignant neoplasm Providers:             Benay Pike. Alice Reichert MD, MD Referring MD:          Baxter Hire, MD (Referring MD) Medicines:             Propofol per Anesthesia Complications:         No immediate complications. Procedure:             Pre-Anesthesia Assessment:                        - The risks and benefits of the procedure and the                         sedation options and risks were discussed with the                         patient. All questions were answered and informed                         consent was obtained.                        - Patient identification and proposed procedure were                         verified prior to the procedure by the nurse. The                         procedure was verified in the procedure room.                        - ASA Grade Assessment: III - A patient with severe                         systemic disease.                        - After reviewing the risks and benefits, the patient                         was deemed in satisfactory condition to undergo the                         procedure.                        After obtaining informed consent, the colonoscope was                         passed under direct vision. Throughout the procedure,                         the patient's blood pressure,  pulse, and oxygen                         saturations were monitored continuously. The                         Colonoscope was introduced through the anus and                         advanced to the the cecum, identified by appendiceal                         orifice and ileocecal valve.  The colonoscopy was                         performed without difficulty. The patient tolerated                         the procedure well. The quality of the bowel                         preparation was good. The ileocecal valve, appendiceal                         orifice, and rectum were photographed. Findings:      The perianal and digital rectal examinations were normal. Pertinent       negatives include normal sphincter tone and no palpable rectal lesions.      A 10 mm polyp was found in the ascending colon. The polyp was       semi-pedunculated. The polyp was removed with a hot snare. Resection and       retrieval were complete. To prevent bleeding after the polypectomy, one       hemostatic clip was successfully placed (MR conditional). There was no       bleeding during, or at the end, of the procedure.      A 7 mm polyp was found in the transverse colon. The polyp was sessile.       The polyp was removed with a cold snare. Resection and retrieval were       complete.      The exam was otherwise without abnormality on direct and retroflexion       views. Impression:            - One 10 mm polyp in the ascending colon, removed with                         a hot snare. Resected and retrieved. Clip (MR                         conditional) was placed.                        - One 7 mm polyp in the transverse colon, removed with                         a cold snare. Resected and retrieved.                        - The examination  was otherwise normal on direct and                         retroflexion views. Recommendation:        - Patient has a contact number available for                         emergencies. The signs and symptoms of potential                         delayed complications were discussed with the patient.                         Return to normal activities tomorrow. Written                         discharge instructions were provided to the patient.                         - Resume previous diet.                        - Continue present medications.                        - Repeat colonoscopy is recommended for surveillance.                         The colonoscopy date will be determined after                         pathology results from today's exam become available                         for review.                        - Return to GI office PRN.                        - The findings and recommendations were discussed with                         the patient. Procedure Code(s):     --- Professional ---                        [phone], Colonoscopy, flexible; with removal of                         tumor(s), polyp(s), or other lesion(s) by snare                         technique Diagnosis Code(s):     --- Professional ---                        K63.5, Polyp of colon                        Z12.11, Encounter for screening for malignant neoplasm  of colon CPT copyright 2019 American Medical Association. All rights reserved. The codes documented in this report are preliminary and upon coder review may  be revised to meet current compliance requirements. Efrain Sella MD, MD 07/30/2019 1:49:53 PM This report has been signed electronically. Number of Addenda: 0 Note Initiated On: 07/30/2019 1:23 PM Scope Withdrawal Time: 0 hours 7 minutes 50 seconds  Total Procedure Duration: 0 hours 14 minutes 28 seconds  Estimated Blood Loss:  Estimated blood loss: none.      [facility]

## 2019-07-30 NOTE — H&P (Signed)
Outpatient short stay form Pre-procedure 07/30/2019 1:21 PM Autumn [name] K. Alice Reichert, M.D.  Primary Physician: Harrel Lemon, M.D.  Reason for visit:  Colon cancer screening  History of present illness:  Patient presents for colonoscopy for colon cancer screening. The patient denies complaints of abdominal pain, significant change in bowel habits, or rectal bleeding.      Current Facility-Administered Medications:  .  0.9 %  sodium chloride infusion, , Intravenous, Continuous, [place], Benay Pike, MD, Last Rate: 20 mL/hr at 07/30/19 1253, New Bag at 07/30/19 1253  Facility-Administered Medications Ordered in Other Encounters:  .  0.9 %  sodium chloride infusion, , Intravenous, Continuous PRN, Gayland Curry, CRNA, New Bag at 07/30/19 1319  Medications Prior to Admission  Medication Sig Dispense Refill Last Dose  . atenolol (TENORMIN) 50 MG tablet Take 50 mg by mouth 2 (two) times daily.    07/30/2019 at 0600  . atorvastatin (LIPITOR) 10 MG tablet Take 1 tablet by mouth daily.    07/29/2019 at Unknown time  . CALCIUM 600+D3 600-400 MG-UNIT TABS Take 1 tablet by mouth 2 (two) times daily with a meal.   11 07/29/2019 at Unknown time  . cetirizine (ZYRTEC) 10 MG tablet Take 10 mg by mouth daily.   Past Week at Unknown time  . fluticasone (FLONASE) 50 MCG/ACT nasal spray Place 2 sprays into both nostrils daily as needed.    Past Week at Unknown time  . Fluticasone-Umeclidin-Vilant (TRELEGY ELLIPTA) 100-62.5-25 MCG/INH AEPB Inhale 1 Inhaler into the lungs daily.   07/29/2019 at Unknown time  . montelukast (SINGULAIR) 10 MG tablet Take 10 mg by mouth at bedtime.   07/29/2019 at Unknown time  . predniSONE (DELTASONE) 10 MG tablet Take 1 tablet (10 mg total) by mouth daily. Start after finishing the prednisone taper (Patient taking differently: Take 5 mg by mouth daily. Start after finishing the prednisone taper)   07/29/2019 at Unknown time  . pregabalin (LYRICA) 75 MG capsule Take 1 capsule (75 mg total)  by mouth 3 (three) times daily. 90 capsule 5 Past Week at Unknown time  . Propylene Glycol (SYSTANE BALANCE) 0.6 % SOLN Place 1 drop into both eyes daily as needed (dryness).   07/29/2019 at Unknown time  . sertraline (ZOLOFT) 25 MG tablet Take 25 mg by mouth daily.  11 07/29/2019 at Unknown time  . acetaminophen (TYLENOL) 500 MG tablet Take 500 mg by mouth every 4 (four) hours as needed for mild pain or moderate pain.     [name] denosumab (PROLIA) 60 MG/ML SOLN injection Inject 60 mg into the skin every 6 (six) months.      [name] HYDROcodone-acetaminophen (NORCO) 10-325 MG tablet Take 1 tablet by mouth every 8 (eight) hours as needed for severe pain. Must last 30 days. 90 tablet 0   . ipratropium-albuterol (DUONEB) 0.5-2.5 (3) MG/3ML SOLN Take 3 mLs by nebulization every 6 (six) hours as needed. 360 mL 0   . mirtazapine (REMERON) 7.5 MG tablet Take 7.5 mg by mouth at bedtime.   Not Taking at Unknown time  . Omega-3 Fatty Acids (FISH OIL) 1000 MG CAPS Take 1,000 mg by mouth daily.    Not Taking at Unknown time     Allergies  Allergen Reactions  . Ivp Dye [Iodinated Diagnostic Agents] Hives and Itching     Past Medical History:  Diagnosis Date  . Allergy   . Anemia   . COPD (chronic obstructive pulmonary disease) ([place])   . Glaucoma   . H/O uvulectomy   .  Hypertension   . Osteoporosis   . Ovarian cancer ([place])   . Postherpetic neuralgia   . Pulmonary sarcoidosis ([place])   . Sarcoid     Review of systems:  Otherwise negative.    Physical Exam  Gen: Alert, oriented. Appears stated age.  HEENT: [place]/AT. PERRLA. Lungs: CTA, no wheezes. CV: RR nl S1, S2. Abd: soft, benign, no masses. BS+ Ext: No edema. Pulses 2+    Planned procedures: Proceed with colonoscopy. The patient understands the nature of the planned procedure, indications, risks, alternatives and potential complications including but not limited to bleeding, infection, perforation, damage to internal organs and possible  oversedation/side effects from anesthesia. The patient agrees and gives consent to proceed.  Please refer to procedure notes for findings, recommendations and patient disposition/instructions.     Autumn [name] K. Alice Reichert, M.D. Gastroenterology 07/30/2019  1:21 PM

## 2019-07-30 NOTE — Transfer of Care (Signed)
Immediate Anesthesia Transfer of Care Note  Patient: Autumn [name]  Procedure(s) Performed: COLONOSCOPY WITH PROPOFOL (N/A )  Patient Location: PACU and Endoscopy Unit  Anesthesia Type:General  Level of Consciousness: drowsy and patient cooperative  Airway & Oxygen Therapy: Patient Spontanous Breathing and Patient connected to nasal cannula oxygen  Post-op Assessment: Report given to RN and Post -op Vital signs reviewed and stable  Post vital signs: Reviewed and stable  Last Vitals:  Vitals Value Taken Time  BP 145/100 07/30/19 1351  Temp    Pulse 73 07/30/19 1350  Resp 20 07/30/19 1351  SpO2 95 % 07/30/19 1350  Vitals shown include unvalidated device data.  Last Pain:  Vitals:   07/30/19 1223  TempSrc: Temporal  PainSc: 0-No pain         Complications: No apparent anesthesia complications

## 2019-07-31 LAB — SURGICAL PATHOLOGY

## 2019-08-01 ENCOUNTER — Telehealth: Payer: Self-pay

## 2019-08-01 ENCOUNTER — Encounter: Payer: Self-pay | Admitting: *Deleted

## 2019-08-01 NOTE — Telephone Encounter (Signed)
Attempted to return call, message left. 

## 2019-08-01 NOTE — Telephone Encounter (Signed)
Attemtped to return patient's call, message left.

## 2019-08-01 NOTE — Telephone Encounter (Signed)
Pt called and wants to speak with someone about her procedure she had on 04/12.

## 2019-08-02 NOTE — Telephone Encounter (Signed)
Had ICNB on 07-23-19. Pain is no better. Is there anything else that can be done?

## 2019-08-03 NOTE — Anesthesia Postprocedure Evaluation (Signed)
Anesthesia Post Note  Patient: Autumn [name]  Procedure(s) Performed: COLONOSCOPY WITH PROPOFOL (N/A )  Patient location during evaluation: PACU Anesthesia Type: General Level of consciousness: awake and alert Pain management: pain level controlled Vital Signs Assessment: post-procedure vital signs reviewed and stable Respiratory status: spontaneous breathing, nonlabored ventilation, respiratory function stable and patient connected to nasal cannula oxygen Cardiovascular status: blood pressure returned to baseline and stable Postop Assessment: no apparent nausea or vomiting Anesthetic complications: no     Last Vitals:  Vitals:   07/30/19 1400 07/30/19 1429  BP: (!) 160/102 (!) 158/78  Pulse: 80   Resp: (!) 22   Temp:    SpO2: 95% 96%    Last Pain:  Vitals:   07/31/19 0748  TempSrc:   PainSc: 0-No pain                 Molli Barrows

## 2019-08-07 ENCOUNTER — Telehealth: Payer: Self-pay | Admitting: *Deleted

## 2019-08-13 ENCOUNTER — Ambulatory Visit
Admission: RE | Admit: 2019-08-13 | Discharge: 2019-08-13 | Disposition: A | Discharge status: Home / Self Care | Payer: Medicare PPO | Source: Ambulatory Visit | Attending: Internal Medicine | Admitting: Internal Medicine

## 2019-08-13 DIAGNOSIS — Z1231 Encounter for screening mammogram for malignant neoplasm of breast: Secondary | ICD-10-CM | POA: Diagnosis present

## 2019-08-16 ENCOUNTER — Other Ambulatory Visit: Payer: Self-pay

## 2019-08-16 ENCOUNTER — Ambulatory Visit
Payer: Medicare PPO | Attending: Student in an Organized Health Care Education/Training Program | Admitting: Student in an Organized Health Care Education/Training Program

## 2019-08-16 ENCOUNTER — Encounter: Payer: Self-pay | Admitting: Student in an Organized Health Care Education/Training Program

## 2019-08-16 DIAGNOSIS — G894 Chronic pain syndrome: Secondary | ICD-10-CM | POA: Diagnosis not present

## 2019-08-16 DIAGNOSIS — M47894 Other spondylosis, thoracic region: Secondary | ICD-10-CM | POA: Diagnosis not present

## 2019-08-16 DIAGNOSIS — B0229 Other postherpetic nervous system involvement: Secondary | ICD-10-CM | POA: Diagnosis not present

## 2019-08-16 DIAGNOSIS — M792 Neuralgia and neuritis, unspecified: Secondary | ICD-10-CM

## 2019-08-16 MED ORDER — HYDROCODONE-ACETAMINOPHEN 10-325 MG PO TABS: 1.0000 | ORAL_TABLET | Freq: Three times a day (TID) | ORAL | 0 refills | Status: AC | PRN

## 2019-08-16 MED ORDER — PREGABALIN 100 MG PO CAPS: 100.0000 mg | ORAL_CAPSULE | Freq: Two times a day (BID) | ORAL | 2 refills | Status: DC

## 2019-08-16 NOTE — Progress Notes (Signed)
Patient: Autumn [name]  Service Category: E/M  Provider: Gillis Santa, MD  DOB: 09-01-1945  DOS: 08/16/2019  Location: Office  MRN: [id number]  Setting: Ambulatory outpatient  Referring Provider: Baxter Hire, MD  Type: Established Patient  Specialty: Interventional Pain Management  PCP: Baxter Hire, MD  Location: Home  Delivery: TeleHealth     Virtual Encounter - Pain Management PROVIDER NOTE: Information contained herein reflects review and annotations entered in association with encounter. Interpretation of such information and data should be left to medically-trained personnel. Information provided to patient can be located elsewhere in the medical record under "Patient Instructions". Document created using STT-dictation technology, any transcriptional errors that may result from process are unintentional.    Contact & Pharmacy Preferred: [phone] Home: [phone] (home) Mobile: [phone] (mobile) E-mail: [email or web address]  [id number] - [address]: [phone]: [phone]  Pre-screening  Autumn. CEulas Postoffered "in-person" vs "virtual" encounter. She indicated preferring virtual for this encounter.   Reason COVID-19*  Social distancing based on CDC and AMA recommendations.   I contacted [name] 08/16/2019 via televisit.      I clearly identified myself as BGillis Santa MD. I verified that I was speaking with the correct person using two identifiers (Name: Autumn [name] and date of birth: 1December 10, 1947.  Consent I sought verbal advanced consent from HGwenith Spitzfor virtual visit interactions. I informed Autumn. CSimaoof possible security and privacy concerns, risks, and limitations associated with providing "not-in-person" medical evaluation and management services. I also informed Autumn. CBehanof the availability of "in-person" appointments. Finally, I informed her that there would  be a charge for the virtual visit and that she could be  personally, fully or partially, financially responsible for it. Autumn. CDedmonexpressed understanding and agreed to proceed.   Historic Elements   Autumn. HLACYE MCCARNis a 74y.o. year old, female patient evaluated today after her last contact with our practice on 08/07/2019. Autumn. [name] has a past medical history of Allergy, Anemia, COPD (chronic obstructive pulmonary disease) ([place], Glaucoma, H/O uvulectomy, Hypertension, Osteoporosis, Ovarian cancer ([place], Postherpetic neuralgia, Pulmonary sarcoidosis ([place], and Sarcoid. She also  has a past surgical history that includes Abdominal hysterectomy; Dilation and curettage of uterus; Vulvar lesion removal; Diagnostic laparoscopy; Eye surgery; Colonoscopy with propofol (N/A, 07/30/2019); and Breast excisional biopsy (Right). Autumn. CChestanghas a current medication list which includes the following prescription(s): acetaminophen, atenolol, atorvastatin, calcium 600+d3, cetirizine, denosumab, fluticasone, trelegy ellipta, ipratropium-albuterol, montelukast, prednisone, propylene glycol, sertraline, cidofovir, hydrocodone-acetaminophen, mirtazapine, fish oil, and pregabalin. She  reports that she has never smoked. She has never used smokeless tobacco. She reports that she does not drink alcohol or use drugs. Autumn. CLecompteis allergic to ivp dye [iodinated diagnostic agents].   HPI  Today, she is being contacted for both, medication management and a post-procedure assessment.   Virtual visit follow-up today status post left T9, T10, T11 intercostal nerve block on the left side.  Unfortunately, this procedure did not provide the patient with any significant pain relief or functional improvement afterwards.  At this point, the patient is status post transforaminal T5-T6 ESI, [place] of which were in effective.  She tried an intercostal nerve block with me which unfortunately was also  ineffective.  At this point, we will focus on medication management.  She states that Lyrica has become less  helpful.  She has tried gabapentin in the past.  Discussed modifying her dose to 100 mg twice a day to see if that helps.  Otherwise we will refill her hydrocodone as below.  Discussed risks and benefits of hydrocodone and goals of care.  At this point, we need to improve her quality of life and manage her pain to medication management.  Pharmacotherapy Assessment  Analgesic: 07/18/2019  2   04/19/2019  Buprenorphine 7.5 Mcg/Hr Patch  4.00  28 Bi Lat   [id number]   Thr (4878)   3  0.18 mg  Medicare   [place]  Monitoring: [place] PMP: PDMP reviewed during this encounter.       Pharmacotherapy: No side-effects or adverse reactions reported. Compliance: No problems identified. Effectiveness: Clinically acceptable. Plan: Refer to "POC".  UDS:  Summary  Date Value Ref Range Status  03/22/2017 FINAL  Final    Comment:    ==================================================================== TOXASSURE COMP DRUG ANALYSIS,UR ==================================================================== Test                             Result       Flag       Units Drug Present and Declared for Prescription Verification   Buprenorphine                  23           EXPECTED   ng/mg creat   Norbuprenorphine               112          EXPECTED   ng/mg creat    Source of buprenorphine is a scheduled prescription medication.    Norbuprenorphine is an expected metabolite of buprenorphine.   Gabapentin                     PRESENT      EXPECTED   Sertraline                     PRESENT      EXPECTED   Desmethylsertraline            PRESENT      EXPECTED    Desmethylsertraline is an expected metabolite of sertraline.   Atenolol                       PRESENT      EXPECTED Drug Absent but Declared for Prescription Verification   Hydrocodone                    Not Detected UNEXPECTED ng/mg creat   Tramadol                        Not Detected UNEXPECTED ng/mg creat   Acetaminophen                  Not Detected UNEXPECTED    Acetaminophen, as indicated in the declared medication list, is    not always detected even when used as directed.   Ibuprofen                      Not Detected UNEXPECTED    Ibuprofen, as indicated in the declared medication list, is not    always detected even when used as directed.   Diphenhydramine  Not Detected UNEXPECTED ==================================================================== Test                      Result    Flag   Units      Ref Range   Creatinine              182              mg/dL      >=20 ==================================================================== Declared Medications:  The flagging and interpretation on this report are based on the  following declared medications.  Unexpected results may arise from  inaccuracies in the declared medications.  **Note: The testing scope of this panel includes these medications:  Atenolol  Buprenorphine  Diphenhydramine  Gabapentin  Hydrocodone (Hydrocodone-Acetaminophen)  Sertraline  Tramadol  **Note: The testing scope of this panel does not include small to  moderate amounts of these reported medications:  Acetaminophen (Hydrocodone-Acetaminophen)  Ibuprofen  **Note: The testing scope of this panel does not include following  reported medications:  Albuterol  Amoxicillin (Augmentin)  Atorvastatin  Azithromycin  Brimonidine Tartrate  Brinzolamide  Calcium (Calcium/Vitamin D3)  Clavulinate (Augmentin)  Fluticasone (Advair)  Latanoprost  Omega-3 Fatty Acids  Ondansetron  Prednisone  Salmeterol (Advair)  Tiotropium  Topical  Vitamin D3 (Calcium/Vitamin D3) ==================================================================== For clinical consultation, please call [phone]. ====================================================================    Laboratory Chemistry Profile    Renal Lab Results  Component Value Date   BUN 28 (H) 02/13/2018   CREATININE 0.68 02/13/2018   GFRAA >60 02/13/2018   GFRNONAA >60 02/13/2018     Hepatic Lab Results  Component Value Date   AST 21 03/29/2015   ALT 20 03/29/2015   ALBUMIN 4.4 03/29/2015   ALKPHOS 46 03/29/2015   LIPASE 116 12/15/2013     Electrolytes Lab Results  Component Value Date   NA 145 02/13/2018   K 4.6 02/13/2018   CL 100 02/13/2018   CALCIUM 10.0 02/13/2018   MG 2.7 (H) 02/07/2018   PHOS 3.2 02/07/2018     Bone No results found for: VD25OH, VD125OH2TOT, WU1324MW1, UU7253GU4, 25OHVITD1, 25OHVITD2, 25OHVITD3, TESTOFREE, TESTOSTERONE   Inflammation (CRP: Acute Phase) (ESR: Chronic Phase) Lab Results  Component Value Date   ESRSEDRATE 48 (H) 05/25/2011       Note: Above Lab results reviewed.  Imaging  MM 3D SCREEN BREAST BILATERAL CLINICAL DATA:  Screening.  EXAM: DIGITAL SCREENING BILATERAL MAMMOGRAM WITH TOMO AND CAD  COMPARISON:  Previous exam(s).  ACR Breast Density Category c: The breast tissue is heterogeneously dense, which may obscure small masses.  FINDINGS: There are no findings suspicious for malignancy. Images were processed with CAD.  IMPRESSION: No mammographic evidence of malignancy. A result letter of this screening mammogram will be mailed directly to the patient.  RECOMMENDATION: Screening mammogram in one year. (Code:SM-B-01Y)  BI-RADS CATEGORY  1: Negative.  Electronically Signed   By: Nolon Nations M.D.   On: 08/13/2019 12:42  Assessment  The primary encounter diagnosis was Post herpetic neuralgia. Diagnoses of Postinfective intercostal neuralgia, Chronic pain syndrome, Thoracic facet joint syndrome, and Neuropathic pain were also pertinent to this visit.  Plan of Care  Autumn. LAREEN MULLINGS has a current medication list which includes the following long-term medication(s): atenolol, atorvastatin, calcium 600+d3, cetirizine, fluticasone,  ipratropium-albuterol, montelukast, sertraline, and pregabalin.  Pharmacotherapy (Medications Ordered): Meds ordered this encounter  Medications  . HYDROcodone-acetaminophen (NORCO) 10-325 MG tablet    Sig: Take 1 tablet by mouth every 8 (eight) hours as needed for  severe pain. Must last 30 days.    Dispense:  90 tablet    Refill:  0    Chronic Pain. (STOP Act - Not applicable). Fill one day early if closed on scheduled refill date.  . pregabalin (LYRICA) 100 MG capsule    Sig: Take 1 capsule (100 mg total) by mouth 2 (two) times daily.    Dispense:  60 capsule    Refill:  2    Fill one day early if pharmacy is closed on scheduled refill date. May substitute for generic if available.   Follow-up plan:   Return for PRN.    Recent Visits Date Type Provider Dept  07/23/19 Procedure visit Gillis Santa, MD [facility]  06/21/19 Office Visit Gillis Santa, MD [facility]  Showing recent visits within past 90 days and meeting all other requirements   Today's Visits Date Type Provider Dept  08/16/19 Telemedicine Gillis Santa, MD [facility]  Showing today's visits and meeting all other requirements   Future Appointments No visits were found meeting these conditions.  Showing future appointments within next 90 days and meeting all other requirements   I discussed the assessment and treatment plan with the patient. The patient was provided an opportunity to ask questions and all were answered. The patient agreed with the plan and demonstrated an understanding of the instructions.  Patient advised to call back or seek an in-person evaluation if the symptoms or condition worsens.  Duration of encounter: 25 minutes.  Note by: Gillis Santa, MD Date: 08/16/2019; Time: 1:11 PM

## 2019-10-29 ENCOUNTER — Telehealth: Payer: Self-pay | Admitting: Student in an Organized Health Care Education/Training Program

## 2019-10-29 NOTE — Telephone Encounter (Signed)
Patient is having a lot of pain and has appt on 11-08-19. She wants to know if there is anything she can do until she can get in here

## 2019-10-30 NOTE — Telephone Encounter (Signed)
Patient calling c/o increased pain in her lower back.  She states she saw a specialist at [facility] and they felt it may be arthritis.  Did not prescribe any medicine. She states that she is having difficulty bending over from the pain.  States she spoke with scheduling and they report they do not have an earlier appt than 11/08/19.  I told patient that I would check with Dr Holley Raring to see what he recommends.

## 2019-11-06 ENCOUNTER — Encounter: Payer: Self-pay | Admitting: Student in an Organized Health Care Education/Training Program

## 2019-11-06 ENCOUNTER — Ambulatory Visit
Payer: Medicare PPO | Attending: Student in an Organized Health Care Education/Training Program | Admitting: Student in an Organized Health Care Education/Training Program

## 2019-11-06 ENCOUNTER — Other Ambulatory Visit: Payer: Self-pay

## 2019-11-06 VITALS — BP 170/71 | Temp 97.4°F | Resp 16 | Ht 68.0 in | Wt 143.0 lb

## 2019-11-06 DIAGNOSIS — M47816 Spondylosis without myelopathy or radiculopathy, lumbar region: Secondary | ICD-10-CM | POA: Diagnosis present

## 2019-11-06 DIAGNOSIS — B0229 Other postherpetic nervous system involvement: Secondary | ICD-10-CM | POA: Diagnosis present

## 2019-11-06 DIAGNOSIS — M792 Neuralgia and neuritis, unspecified: Secondary | ICD-10-CM | POA: Insufficient documentation

## 2019-11-06 DIAGNOSIS — G894 Chronic pain syndrome: Secondary | ICD-10-CM

## 2019-11-06 MED ORDER — PREGABALIN 100 MG PO CAPS: 100.0000 mg | ORAL_CAPSULE | Freq: Two times a day (BID) | ORAL | 2 refills | Status: DC

## 2019-11-06 NOTE — Progress Notes (Signed)
Nursing Pain Medication Assessment:  Safety precautions to be maintained throughout the outpatient stay will include: orient to surroundings, keep bed in low position, maintain call bell within reach at all times, provide assistance with transfer out of bed and ambulation.  Medication Inspection Compliance: Pill count conducted under aseptic conditions, in front of the patient. Neither the pills nor the bottle was removed from the patient's sight at any time. Once count was completed pills were immediately returned to the patient in their original bottle.  Medication: Hydrocodone/APAP Pill/Patch Count: 63.5 of 90 pills remain Pill/Patch Appearance: Markings consistent with prescribed medication Bottle Appearance: Standard pharmacy container. Clearly labeled. Filled Date: 08/21/2019 Last Medication intake:  Today

## 2019-11-06 NOTE — Progress Notes (Signed)
PROVIDER NOTE: Information contained herein reflects review and annotations entered in association with encounter. Interpretation of such information and data should be left to medically-trained personnel. Information provided to patient can be located elsewhere in the medical record under "Patient Instructions". Document created using STT-dictation technology, any transcriptional errors that may result from process are unintentional.    Patient: Autumn [name]  Service Category: E/M  Provider: Gillis Santa, MD  DOB: 08-09-1945  DOS: 11/06/2019  Specialty: Interventional Pain Management  MRN: [id number]  Setting: Ambulatory outpatient  PCP: Baxter Hire, MD  Type: Established Patient    Referring Provider: Baxter Hire, MD  Location: Office  Delivery: Face-to-face     HPI  Reason for encounter: [name], a 74 y.o. year old female, is here today for evaluation and management of her Lumbar facet arthropathy [M47.816]. Autumn [name] primary complain today is Back Pain (lower, left upper) Last encounter: Practice (10/29/2019). My last encounter with her was on 10/29/2019. Pertinent problems: Autumn [name] has COPD (chronic obstructive pulmonary disease) ([place]); Post herpetic neuralgia; Pulmonary sarcoidosis ([place]); Chronic pain syndrome; Thoracic radiculopathy; and Spondylosis without myelopathy or radiculopathy, lumbar region on their pertinent problem list. Pain Assessment: Severity of Chronic pain is reported as a 8 /10. Location: Back Left, Upper/denies. Onset: More than a month ago. Quality: Burning. Timing: Constant. Modifying factor(s): Hydrocodone. Vitals:  height is _0  (1.727 m) and weight is 143 lb (64.9 kg). Her temporal temperature is 97.4 F (36.3 C) (abnormal). Her blood pressure is 170/71 (abnormal). Her respiration is 16 and oxygen saturation is 98%.   Patient follows up today complaining of worsening lower back pain.  She seen Dorise Hiss with orthopedics regarding her low  back pain.  It was recommended to her to follow-up with me for lumbar facet medial branch nerve blocks and possible radiofrequency ablation if positive diagnostic facet block.  Of note patient has tried dorsal root ganglion stimulator trial, thoracic epidural steroid injections for her mid back pain which were not effective long-term.  For her low back pain that is worse with facet loading and with x-ray evidence of moderate to severe lumbar lumbar facet arthritis at L3-L4, L4-L5 and L5-S1, diagnostic facet blocks are reasonable approach.  ROS  Constitutional: Denies any fever or chills Gastrointestinal: No reported hemesis, hematochezia, vomiting, or acute GI distress Musculoskeletal: Low back pain, thoracic pain, intercostal pain Neurological: No reported episodes of acute onset apraxia, aphasia, dysarthria, agnosia, amnesia, paralysis, loss of coordination, or loss of consciousness  Medication Review  Calcium Carbonate-Vitamin D3, Fish Oil, Fluticasone-Umeclidin-Vilant, HYDROcodone-acetaminophen, Propylene Glycol, acetaminophen, atenolol, atorvastatin, cetirizine, cidofovir, denosumab, fluticasone, ipratropium-albuterol, mirtazapine, montelukast, predniSONE, pregabalin, and sertraline  History Review  Allergy: Autumn [name] is allergic to ivp dye [iodinated diagnostic agents]. Drug: Autumn [name]  reports no history of drug use. Alcohol:  reports no history of alcohol use. Tobacco:  reports that she has never smoked. She has never used smokeless tobacco. Social: Autumn [name]  reports that she has never smoked. She has never used smokeless tobacco. She reports that she does not drink alcohol and does not use drugs. Medical:  has a past medical history of Allergy, Anemia, COPD (chronic obstructive pulmonary disease) ([place]), Glaucoma, H/O uvulectomy, Hypertension, Osteoporosis, Ovarian cancer ([place]), Postherpetic neuralgia, Pulmonary sarcoidosis ([place]), and Sarcoid. Surgical: Autumn [name]  has a past surgical  history that includes Abdominal hysterectomy; Dilation and curettage of uterus; Vulvar lesion removal; Diagnostic laparoscopy; Eye surgery; Colonoscopy with propofol (N/A, 07/30/2019); and Breast excisional biopsy (  Right). Family: family history includes Breast cancer (age of onset: 66) in her mother; Cancer in her mother; Hypertension in her father.  Laboratory Chemistry Profile   Renal Lab Results  Component Value Date   BUN 28 (H) 02/13/2018   CREATININE 0.68 02/13/2018   GFRAA >60 02/13/2018   GFRNONAA >60 02/13/2018     Hepatic Lab Results  Component Value Date   AST 21 03/29/2015   ALT 20 03/29/2015   ALBUMIN 4.4 03/29/2015   ALKPHOS 46 03/29/2015   LIPASE 116 12/15/2013     Electrolytes Lab Results  Component Value Date   NA 145 02/13/2018   K 4.6 02/13/2018   CL 100 02/13/2018   CALCIUM 10.0 02/13/2018   MG 2.7 (H) 02/07/2018   PHOS 3.2 02/07/2018     Bone No results found for: VD25OH, VD125OH2TOT, OT1572IO0, BT5974BU3, 25OHVITD1, 25OHVITD2, 25OHVITD3, TESTOFREE, TESTOSTERONE   Inflammation (CRP: Acute Phase) (ESR: Chronic Phase) Lab Results  Component Value Date   ESRSEDRATE 48 (H) 05/25/2011       Note: Above Lab results reviewed.  Recent Imaging Review  MM 3D SCREEN BREAST BILATERAL CLINICAL DATA:  Screening.  EXAM: DIGITAL SCREENING BILATERAL MAMMOGRAM WITH TOMO AND CAD  COMPARISON:  Previous exam(s).  ACR Breast Density Category c: The breast tissue is heterogeneously dense, which may obscure small masses.  FINDINGS: There are no findings suspicious for malignancy. Images were processed with CAD.  IMPRESSION: No mammographic evidence of malignancy. A result letter of this screening mammogram will be mailed directly to the patient.  RECOMMENDATION: Screening mammogram in one year. (Code:SM-B-01Y)  BI-RADS CATEGORY  1: Negative.  Electronically Signed   By: Nolon Nations M.D.   On: 08/13/2019 12:42 Note: Reviewed        Physical  Exam  General appearance: Well nourished, well developed, and well hydrated. In no apparent acute distress Mental status: Alert, oriented x 3 (person, place, & time)       Respiratory: No evidence of acute respiratory distress Eyes: PERLA Vitals: BP (!) 170/71   Temp (!) 97.4 F (36.3 C) (Temporal)   Resp 16   Ht _0  (1.727 m)   Wt 143 lb (64.9 kg)   SpO2 98% Comment: oxygen at 2L  BMI 21.74 kg/m  BMI: Estimated body mass index is 21.74 kg/m as calculated from the following:   Height as of this encounter: _1  (1.727 m).   Weight as of this encounter: 143 lb (64.9 kg). Ideal: Ideal body weight: 63.9 kg (140 lb 14 oz) Adjusted ideal body weight: 64.3 kg (141 lb 11.6 oz)  Lumbar Spine Area Exam  Skin & Axial Inspection: No masses, redness, or swelling Alignment: Levoscoliosis Functional ROM: Pain restricted ROM affecting primarily the left Stability: No instability detected Muscle Tone/Strength: Functionally intact. No obvious neuro-muscular anomalies detected. Sensory (Neurological): Articular pain pattern Palpation: No palpable anomalies       Provocative Tests: Hyperextension/rotation test: Positive bilaterally for facet joint pain.,  Pain with facet loading Lumbar quadrant test (Kemp's test): (+) bilaterally for facet joint pain. Lateral bending test: (+) due to pain. Patrick's Maneuver: deferred today                   FABER* test: deferred today                   S-I anterior distraction/compression test: deferred today         S-I lateral compression test: deferred today  S-I Thigh-thrust test: deferred today         S-I Gaenslen's test: deferred today         *(Flexion, ABduction and External Rotation) Gait & Posture Assessment  Ambulation: Limited Gait: Limited. Using assistive device to ambulate Posture: Difficulty standing up straight, due to pain  Lower Extremity Exam    Side: Right lower extremity  Side: Left lower extremity  Stability: No  instability observed          Stability: No instability observed          Skin & Extremity Inspection: Skin color, temperature, and hair growth are WNL. No peripheral edema or cyanosis. No masses, redness, swelling, asymmetry, or associated skin lesions. No contractures.  Skin & Extremity Inspection: Skin color, temperature, and hair growth are WNL. No peripheral edema or cyanosis. No masses, redness, swelling, asymmetry, or associated skin lesions. No contractures.  Functional ROM: Pain restricted ROM for all joints of the lower extremity          Functional ROM: Pain restricted ROM for all joints of the lower extremity          Muscle Tone/Strength: Functionally intact. No obvious neuro-muscular anomalies detected.  Muscle Tone/Strength: Functionally intact. No obvious neuro-muscular anomalies detected.  Sensory (Neurological): Unimpaired        Sensory (Neurological): Unimpaired        DTR: Patellar: deferred today Achilles: deferred today Plantar: deferred today  DTR: Patellar: deferred today Achilles: deferred today Plantar: deferred today  Palpation: No palpable anomalies  Palpation: No palpable anomalies    Assessment   Status Diagnosis  Controlled Controlled Controlled 1. Lumbar facet arthropathy   2. Spondylosis without myelopathy or radiculopathy, lumbar region   3. Neuropathic pain   4. Chronic pain syndrome   5. Postinfective intercostal neuralgia   6. Post herpetic neuralgia      Updated Problems: Problem  Spondylosis Without Myelopathy Or Radiculopathy, Lumbar Region  Chronic Pain Syndrome  Thoracic Radiculopathy  Copd (Chronic Obstructive Pulmonary Disease) ([facility])  Post Herpetic Neuralgia   Overview:  Added automatically from request for surgery [id number]   Pulmonary Sarcoidosis ([facility])   Overview:  TbBx-noncaseating granulomas     Plan of Care   Autumn [name] has a current medication list which includes the following long-term medication(s): atenolol,  atorvastatin, calcium 600+d3, cetirizine, fluticasone, ipratropium-albuterol, montelukast, pregabalin, and sertraline.  Autumn [name] has a history of greater than 3 months of moderate to severe pain which is resulted in functional impairment.  The patient has tried various conservative therapeutic options such as NSAIDs, Tylenol, muscle relaxants, physical therapy which was inadequately effective.  Patient's pain is predominantly axial with physical exam findings suggestive of facet arthropathy. X-rays of the lumbar spine reviewed by me today from 02/05/2019 showed no evidence of compression fracture. To space degeneration moderate to severe L5-S1 with moderate lower lumbar facet arthritis L3-L4 L4-L5 and L5-S1. No spondylolisthesis. Significant thoracolumbar levocurvature   Lumbar facet medial branch nerve blocks were discussed with the patient.  Risks and benefits were reviewed.  Patient would like to proceed with bilateral L3, L4, L5 medial branch nerve block with sedation.    Pharmacotherapy (Medications Ordered): Meds ordered this encounter  Medications  . pregabalin (LYRICA) 100 MG capsule    Sig: Take 1 capsule (100 mg total) by mouth 2 (two) times daily.    Dispense:  60 capsule    Refill:  2    Fill one day early if pharmacy is  closed on scheduled refill date. May substitute for generic if available.   Orders:  Orders Placed This Encounter  Procedures  . LUMBAR FACET(MEDIAL BRANCH NERVE BLOCK) MBNB    Standing Status:   Future    Standing Expiration Date:   12/07/2019    Scheduling Instructions:     Procedure: Lumbar facet block (AKA.: Lumbosacral medial branch nerve block)     Side: Bilateral     Level: L3-4, L4-5  Facets (L3, L4, L5,  Medial Branch Nerves)     Sedation: Patient's choice.     Timeframe: ASAA    Order Specific Question:   Where will this procedure be performed?    Answer:   [facility]   Follow-up plan:   Return in about 1 week (around 11/13/2019)  for B/L L3, 4, 5 Fct Block, with sedation.   Recent Visits Date Type Provider Dept  08/16/19 Telemedicine Gillis Santa, MD [facility]  Showing recent visits within past 90 days and meeting all other requirements Today's Visits Date Type Provider Dept  11/06/19 Office Visit Gillis Santa, MD [facility]  Showing today's visits and meeting all other requirements Future Appointments No visits were found meeting these conditions. Showing future appointments within next 90 days and meeting all other requirements  I discussed the assessment and treatment plan with the patient. The patient was provided an opportunity to ask questions and all were answered. The patient agreed with the plan and demonstrated an understanding of the instructions.  Patient advised to call back or seek an in-person evaluation if the symptoms or condition worsens.  Duration of encounter: 35 minutes.  Note by: Gillis Santa, MD Date: 11/06/2019; Time: 3:59 PM

## 2019-11-06 NOTE — Patient Instructions (Signed)
____________________________________________________________________________________________  Preparing for Procedure with Sedation  Procedure appointments are limited to planned procedures: . No Prescription Refills. . No disability issues will be discussed. . No medication changes will be discussed.  Instructions: . Oral Intake: Do not eat or drink anything for at least 8 hours prior to your procedure. (Exception: Blood Pressure Medication. See below.) . Transportation: Unless otherwise stated by your physician, you may drive yourself after the procedure. . Blood Pressure Medicine: Do not forget to take your blood pressure medicine with a sip of water the morning of the procedure. If your Diastolic (lower reading)is above 100 mmHg, elective cases will be cancelled/rescheduled. . Blood thinners: These will need to be stopped for procedures. Notify our staff if you are taking any blood thinners. Depending on which one you take, there will be specific instructions on how and when to stop it. . Diabetics on insulin: Notify the staff so that you can be scheduled 1st case in the morning. If your diabetes requires high dose insulin, take only  of your normal insulin dose the morning of the procedure and notify the staff that you have done so. . Preventing infections: Shower with an antibacterial soap the morning of your procedure. . Build-up your immune system: Take 1000 mg of Vitamin C with every meal (3 times a day) the day prior to your procedure. . Antibiotics: Inform the staff if you have a condition or reason that requires you to take antibiotics before dental procedures. . Pregnancy: If you are pregnant, call and cancel the procedure. . Sickness: If you have a cold, fever, or any active infections, call and cancel the procedure. . Arrival: You must be in the facility at least 30 minutes prior to your scheduled procedure. . Children: Do not bring children with you. . Dress appropriately:  Bring dark clothing that you would not mind if they get stained. . Valuables: Do not bring any jewelry or valuables.  Reasons to call and reschedule or cancel your procedure: (Following these recommendations will minimize the risk of a serious complication.) . Surgeries: Avoid having procedures within 2 weeks of any surgery. (Avoid for 2 weeks before or after any surgery). . Flu Shots: Avoid having procedures within 2 weeks of a flu shots or . (Avoid for 2 weeks before or after immunizations). . Barium: Avoid having a procedure within 7-10 days after having had a radiological study involving the use of radiological contrast. (Myelograms, Barium swallow or enema study). . Heart attacks: Avoid any elective procedures or surgeries for the initial 6 months after a "Myocardial Infarction" (Heart Attack). . Blood thinners: It is imperative that you stop these medications before procedures. Let us know if you if you take any blood thinner.  . Infection: Avoid procedures during or within two weeks of an infection (including chest colds or gastrointestinal problems). Symptoms associated with infections include: Localized redness, fever, chills, night sweats or profuse sweating, burning sensation when voiding, cough, congestion, stuffiness, runny nose, sore throat, diarrhea, nausea, vomiting, cold or Flu symptoms, recent or current infections. It is specially important if the infection is over the area that we intend to treat. . Heart and lung problems: Symptoms that may suggest an active cardiopulmonary problem include: cough, chest pain, breathing difficulties or shortness of breath, dizziness, ankle swelling, uncontrolled high or unusually low blood pressure, and/or palpitations. If you are experiencing any of these symptoms, cancel your procedure and contact your primary care physician for an evaluation.  Remember:  Regular Business hours are:    Monday to Thursday 8:00 AM to 4:00 PM  Provider's  Schedule: Milinda Pointer, MD:  Procedure days: Tuesday and Thursday 7:30 AM to 4:00 PM  Gillis Santa, MD:  Procedure days: Monday and Wednesday 7:30 AM to 4:00 PM ____________________________________________________________________________________________  A prescription for Lyrica has been sent to your pharmacy.

## 2019-11-08 ENCOUNTER — Ambulatory Visit: Payer: Medicare PPO | Admitting: Student in an Organized Health Care Education/Training Program

## 2019-12-03 ENCOUNTER — Ambulatory Visit
Admission: RE | Admit: 2019-12-03 | Discharge: 2019-12-03 | Disposition: A | Discharge status: Home / Self Care | Payer: Medicare PPO | Source: Ambulatory Visit | Attending: Student in an Organized Health Care Education/Training Program | Admitting: Student in an Organized Health Care Education/Training Program

## 2019-12-03 ENCOUNTER — Other Ambulatory Visit: Payer: Self-pay

## 2019-12-03 ENCOUNTER — Encounter: Payer: Self-pay | Admitting: Student in an Organized Health Care Education/Training Program

## 2019-12-03 ENCOUNTER — Ambulatory Visit (HOSPITAL_BASED_OUTPATIENT_CLINIC_OR_DEPARTMENT_OTHER): Payer: Medicare PPO | Admitting: Student in an Organized Health Care Education/Training Program

## 2019-12-03 DIAGNOSIS — G894 Chronic pain syndrome: Secondary | ICD-10-CM | POA: Insufficient documentation

## 2019-12-03 DIAGNOSIS — M47816 Spondylosis without myelopathy or radiculopathy, lumbar region: Secondary | ICD-10-CM | POA: Insufficient documentation

## 2019-12-03 MED ORDER — DEXAMETHASONE SODIUM PHOSPHATE 10 MG/ML IJ SOLN
10.0000 mg | Freq: Once | INTRAMUSCULAR | Status: AC
  Administered 2019-12-03: 10 mg

## 2019-12-03 MED ORDER — ROPIVACAINE HCL 2 MG/ML IJ SOLN
9.0000 mL | Freq: Once | INTRAMUSCULAR | Status: AC
  Administered 2019-12-03: 10 mL via PERINEURAL

## 2019-12-03 MED ORDER — MIDAZOLAM HCL 5 MG/5ML IJ SOLN
INTRAMUSCULAR | Status: AC
  Filled 2019-12-03: qty 5

## 2019-12-03 MED ORDER — FENTANYL CITRATE (PF) 100 MCG/2ML IJ SOLN
25.0000 ug | INTRAMUSCULAR | Status: DC | PRN
  Administered 2019-12-03: 75 ug via INTRAVENOUS

## 2019-12-03 MED ORDER — LIDOCAINE HCL 2 % IJ SOLN
20.0000 mL | Freq: Once | INTRAMUSCULAR | Status: AC
  Administered 2019-12-03: 200 mg

## 2019-12-03 MED ORDER — DEXAMETHASONE SODIUM PHOSPHATE 10 MG/ML IJ SOLN
INTRAMUSCULAR | Status: AC
  Filled 2019-12-03: qty 1

## 2019-12-03 MED ORDER — ROPIVACAINE HCL 2 MG/ML IJ SOLN
INTRAMUSCULAR | Status: AC
  Filled 2019-12-03: qty 20

## 2019-12-03 MED ORDER — LIDOCAINE HCL 2 % IJ SOLN
INTRAMUSCULAR | Status: AC
  Filled 2019-12-03: qty 10

## 2019-12-03 MED ORDER — FENTANYL CITRATE (PF) 100 MCG/2ML IJ SOLN
INTRAMUSCULAR | Status: AC
  Filled 2019-12-03: qty 2

## 2019-12-03 NOTE — Patient Instructions (Addendum)
Post-procedure Information What to expect: Most procedures involve the use of a local anesthetic (numbing medicine), and a steroid (anti-inflammatory medicine).  The local anesthetics may cause temporary numbness and weakness of the legs or arms, depending on the location of the block. This numbness/weakness may last 4-6 hours, depending on the local anesthetic used. In rare instances, it can last up to 24 hours. While numb, you must be very careful not to injure the extremity.  After any procedure, you could expect the pain to get better within 15-20 minutes. This relief is temporary and may last 4-6 hours. Once the local anesthetics wears off, you could experience discomfort, possibly more than usual, for up to 10 (ten) days. In the case of radiofrequencies, it may last up to 6 weeks. Surgeries may take up to 8 weeks for the healing process. The discomfort is due to the irritation caused by needles going through skin and muscle. To minimize the discomfort, we recommend using ice the first day, and heat from then on. The ice should be applied for 15 minutes on, and 15 minutes off. Keep repeating this cycle until bedtime. Avoid applying the ice directly to the skin, to prevent frostbite. Heat should be used daily, until the pain improves (4-10 days). Be careful not to burn yourself.  Occasionally you may experience muscle spasms or cramps. These occur as a consequence of the irritation caused by the needle sticks to the muscle and the blood that will inevitably be lost into the surrounding muscle tissue. Blood tends to be very irritating to tissues, which tend to react by going into spasm. These spasms may start the same day of your procedure, but they may also take days to develop. This late onset type of spasm or cramp is usually caused by electrolyte imbalances triggered by the steroids, at the level of the kidney. Cramps and spasms tend to respond well to muscle relaxants, multivitamins (some are  triggered by the procedure, but may have their origins in vitamin deficiencies), and "Gatorade", or any sports drinks that can replenish any electrolyte imbalances. (If you are a diabetic, ask your pharmacist to get you a sugar-free brand.) Warm showers or baths may also be helpful. Stretching exercises are highly recommended. General Instructions:  Be alert for signs of possible infection: redness, swelling, heat, red streaks, elevated temperature, and/or fever. These typically appear 4 to 6 days after the procedure. Immediately notify your doctor if you experience unusual bleeding, difficulty breathing, or loss of bowel or bladder control. If you experience increased pain, do not increase your pain medicine intake, unless instructed by your pain physician. Post-Procedure Care:  Be careful in moving about. Muscle spasms in the area of the injection may occur. Applying ice or heat to the area is often helpful. The incidence of spinal headaches after epidural injections ranges between 1.4% and 6%. If you develop a headache that does not seem to respond to conservative therapy, please let your physician know. This can be treated with an epidural blood patch.   Post-procedure numbness or redness is to be expected, however it should average 4 to 6 hours. If numbness and weakness of your extremities begins to develop 4 to 6 hours after your procedure, and is felt to be progressing and worsening, immediately contact your physician.   Diet:  If you experience nausea, do not eat until this sensation goes away. If you had a "Stellate Ganglion Block" for upper extremity "Reflex Sympathetic Dystrophy", do not eat or drink until your   hoarseness goes away. In any case, always start with liquids first and if you tolerate them well, then slowly progress to more solid foods. Activity:  For the first 4 to 6 hours after the procedure, use caution in moving about as you may experience numbness and/or weakness. Use caution in  cooking, using household electrical appliances, and climbing steps. If you need to reach your Doctor call our office: [phone] Monday-Thursday 8:00 am - 4:00 PM    Fridays: Closed     In case of an emergency: In case of emergency, call 911 or go to the nearest emergency room and have the physician there call us.  Interpretation of Procedure Every nerve block has two components: a diagnostic component, and a treatment component. Unrealistic expectations are the most common causes of "perceived failure".  In a perfect world, a single nerve block should be able to completely and permanently eliminate the pain. Sadly, the world is not perfect.  Most pain management nerve blocks are performed using local anesthetics and steroids. Steroids are responsible for any long-term benefit that you may experience. Their purpose is to decrease any chronic swelling that may exist in the area. Steroids begin to work immediately after being injected. However, most patients will not experience any benefits until 5 to 10 days after the injection, when the swelling has come down to the point where they can tell a difference. Steroids will only help if there is swelling to be treated. As such, they can assist with the diagnosis. If effective, they suggest an inflammatory component to the pain, and if ineffective, they rule out inflammation as the main cause or component of the problem. If the problem is one of mechanical compression, you will get no benefit from those steroids.   In the case of local anesthetics, they have a crucial role in the diagnosis of your condition. Most will begin to work within15 to 20 minutes after injection. The duration will depend on the type used (short- vs. Long-acting). It is of outmost importance that patients keep tract of their pain, after the procedure. To assist with this matter, a "Post-procedure Pain Diary" is provided. Make sure to complete it and to bring it back to your  follow-up appointment.  As long as the patient keeps accurate, detailed records of their symptoms after every procedure, and returns to have those interpreted, every procedure will provide us with invaluable information. Even a block that does not provide the patient with any relief, will always provide us with information about the mechanism and the origin of the pain. The only time a nerve block can be considered a waste of time is when patients do not keep track of the results, or do not keep their post-procedure appointment.  Reporting the results back to your physician The Pain Score  Pain is a subjective complaint. It cannot be seen, touched, or measured. We depend entirely on the patient's report of the pain in order to assess your condition and treatment. To evaluate the pain, we use a pain scale, where "0" means "No Pain", and a "10" is "the worst possible pain that you can even imagine" (i.e. something like been eaten alive by a shark or being torn apart by a lion).   You will frequently be asked to rate your pain. Please be as accurate, remember that medical decisions will be based on your responses. Please do not rate your pain above a 10. Doing so is actually interpreted as "symptom magnification" (exaggeration), as   well as lack of understanding with regards to the scale. To put this into perspective, when you tell us that your pain is at a 10 (ten), what you are saying is that there is nothing we can do to make this pain any worse. (Carefully think about that.)    Instructed to follow up with PCP regarding blood pressure post procedure.

## 2019-12-03 NOTE — Progress Notes (Signed)
PROVIDER NOTE: Information contained herein reflects review and annotations entered in association with encounter. Interpretation of such information and data should be left to medically-trained personnel. Information provided to patient can be located elsewhere in the medical record under "Patient Instructions". Document created using STT-dictation technology, any transcriptional errors that may result from process are unintentional.    Patient: Autumn [name]  Service Category: Procedure  Provider: Gillis Santa, MD  DOB: Jun 05, 1945  DOS: 12/03/2019  Location: [facility]  MRN: [id number]  Setting: Ambulatory - outpatient  Referring Provider: Gillis Santa, MD  Type: Established Patient  Specialty: Interventional Pain Management  PCP: Baxter Hire, MD   Primary Reason for Visit: Interventional Pain Management Treatment. CC: Back Pain (lower)  Procedure:          Anesthesia, Analgesia, Anxiolysis:  Type: Lumbar Facet, Medial Branch Block(s) #1  Primary Purpose: Diagnostic Region: Posterolateral Lumbosacral Spine Level:  L3, L4, L5,  Medial Branch Level(s). Injecting these levels blocks the L3-4, L4-5,  lumbar facet joints. Laterality: Bilateral  Type: Moderate (Conscious) Sedation combined with Local Anesthesia Indication(s): Analgesia and Anxiety Route: Intravenous (IV) IV Access: Secured Sedation: Meaningful verbal contact was maintained at all times during the procedure  Local Anesthetic: Lidocaine 1-2%  Position: Prone   Indications: 1. Lumbar facet arthropathy   2. Spondylosis without myelopathy or radiculopathy, lumbar region   3. Chronic pain syndrome    Pain Score: Pre-procedure: 8 /10 Post-procedure: 0-No pain/10   Pre-op Assessment:  Autumn [name] is a 74 y.o. (year old), female patient, seen today for interventional treatment. She  has a past surgical history that includes Abdominal hysterectomy; Dilation and curettage of uterus; Vulvar lesion removal;  Diagnostic laparoscopy; Eye surgery; Colonoscopy with propofol (N/A, 07/30/2019); and Breast excisional biopsy (Right). Autumn [name] has a current medication list which includes the following prescription(s): acetaminophen, atenolol, atorvastatin, calcium 600+d3, cidofovir, denosumab, fluticasone, trelegy ellipta, hydrocodone-acetaminophen, ipratropium-albuterol, montelukast, prednisone, pregabalin, propylene glycol, sertraline, cetirizine, mirtazapine, and fish oil, and the following Facility-Administered Medications: fentanyl. Her primarily concern today is the Back Pain (lower)  Initial Vital Signs:  Pulse/HCG Rate: 64ECG Heart Rate: 72 (nsr) Temp: (!) 97.5 F (36.4 C) Resp: 16 BP: (!) 169/70 SpO2: 100 %  BMI: Estimated body mass index is 21.44 kg/m as calculated from the following:   Height as of this encounter: 5\' 8"  (1.727 m).   Weight as of this encounter: 141 lb (64 kg).  Risk Assessment: Allergies: Reviewed. She is allergic to ivp dye [iodinated diagnostic agents].  Allergy Precautions: None required Coagulopathies: Reviewed. None identified.  Blood-thinner therapy: None at this time Active Infection(s): Reviewed. None identified. Autumn [name] is afebrile  Site Confirmation: Autumn [name] was asked to confirm the procedure and laterality before marking the site Procedure checklist: Completed Consent: Before the procedure and under the influence of no sedative(s), amnesic(s), or anxiolytics, the patient was informed of the treatment options, risks and possible complications. To fulfill our ethical and legal obligations, as recommended by the American Medical Association's Code of Ethics, I have informed the patient of my clinical impression; the nature and purpose of the treatment or procedure; the risks, benefits, and possible complications of the intervention; the alternatives, including doing nothing; the risk(s) and benefit(s) of the alternative treatment(s) or procedure(s); and the  risk(s) and benefit(s) of doing nothing. The patient was provided information about the general risks and possible complications associated with the procedure. These may include, but are not limited to: failure to achieve desired goals,  infection, bleeding, organ or nerve damage, allergic reactions, paralysis, and death. In addition, the patient was informed of those risks and complications associated to Spine-related procedures, such as failure to decrease pain; infection (i.e.: Meningitis, epidural or intraspinal abscess); bleeding (i.e.: epidural hematoma, subarachnoid hemorrhage, or any other type of intraspinal or peri-dural bleeding); organ or nerve damage (i.e.: Any type of peripheral nerve, nerve root, or spinal cord injury) with subsequent damage to sensory, motor, and/or autonomic systems, resulting in permanent pain, numbness, and/or weakness of one or several areas of the body; allergic reactions; (i.e.: anaphylactic reaction); and/or death. Furthermore, the patient was informed of those risks and complications associated with the medications. These include, but are not limited to: allergic reactions (i.e.: anaphylactic or anaphylactoid reaction(s)); adrenal axis suppression; blood sugar elevation that in diabetics may result in ketoacidosis or comma; water retention that in patients with history of congestive heart failure may result in shortness of breath, pulmonary edema, and decompensation with resultant heart failure; weight gain; swelling or edema; medication-induced neural toxicity; particulate matter embolism and blood vessel occlusion with resultant organ, and/or nervous system infarction; and/or aseptic necrosis of one or more joints. Finally, the patient was informed that Medicine is not an exact science; therefore, there is also the possibility of unforeseen or unpredictable risks and/or possible complications that may result in a catastrophic outcome. The patient indicated having  understood very clearly. We have given the patient no guarantees and we have made no promises. Enough time was given to the patient to ask questions, all of which were answered to the patient's satisfaction. Ms. Probasco has indicated that she wanted to continue with the procedure. Attestation: I, the ordering provider, attest that I have discussed with the patient the benefits, risks, side-effects, alternatives, likelihood of achieving goals, and potential problems during recovery for the procedure that I have provided informed consent. Date  Time: 12/03/2019  9:07 AM  Pre-Procedure Preparation:  Monitoring: As per clinic protocol. Respiration, ETCO2, SpO2, BP, heart rate and rhythm monitor placed and checked for adequate function Safety Precautions: Patient was assessed for positional comfort and pressure points before starting the procedure. Time-out: I initiated and conducted the "Time-out" before starting the procedure, as per protocol. The patient was asked to participate by confirming the accuracy of the "Time Out" information. Verification of the correct person, site, and procedure were performed and confirmed by me, the nursing staff, and the patient. "Time-out" conducted as per Joint Commission's Universal Protocol (UP.01.01.01). Time: [phone]  Description of Procedure:          Laterality: Bilateral. The procedure was performed in identical fashion on both sides. Levels:  L3, L4, L5, Medial Branch Level(s) Area Prepped: Posterior Lumbosacral Region DuraPrep (Iodine Povacrylex [0.7% available iodine] and Isopropyl Alcohol, 74% w/w) Safety Precautions: Aspiration looking for blood return was conducted prior to all injections. At no point did we inject any substances, as a needle was being advanced. Before injecting, the patient was told to immediately notify me if she was experiencing any new onset of "ringing in the ears, or metallic taste in the mouth". No attempts were made at seeking any  paresthesias. Safe injection practices and needle disposal techniques used. Medications properly checked for expiration dates. SDV (single dose vial) medications used. After the completion of the procedure, all disposable equipment used was discarded in the proper designated medical waste containers. Local Anesthesia: Protocol guidelines were followed. The patient was positioned over the fluoroscopy table. The area was prepped in the usual manner.  The time-out was completed. The target area was identified using fluoroscopy. A 12-in long, straight, sterile hemostat was used with fluoroscopic guidance to locate the targets for each level blocked. Once located, the skin was marked with an approved surgical skin marker. Once all sites were marked, the skin (epidermis, dermis, and hypodermis), as well as deeper tissues (fat, connective tissue and muscle) were infiltrated with a small amount of a short-acting local anesthetic, loaded on a 10cc syringe with a 25G, 1.5-in  Needle. An appropriate amount of time was allowed for local anesthetics to take effect before proceeding to the next step. Local Anesthetic: Lidocaine 2.0% The unused portion of the local anesthetic was discarded in the proper designated containers. Technical explanation of process:  L3 Medial Branch Nerve Block (MBB): The target area for the L3 medial branch is at the junction of the postero-lateral aspect of the superior articular process and the superior, posterior, and medial edge of the transverse process of L4. Under fluoroscopic guidance, a Quincke needle was inserted until contact was made with os over the superior postero-lateral aspect of the pedicular shadow (target area). After negative aspiration for blood, 2 mL of the nerve block solution was injected without difficulty or complication. The needle was removed intact. L4 Medial Branch Nerve Block (MBB): The target area for the L4 medial branch is at the junction of the postero-lateral  aspect of the superior articular process and the superior, posterior, and medial edge of the transverse process of L5. Under fluoroscopic guidance, a Quincke needle was inserted until contact was made with os over the superior postero-lateral aspect of the pedicular shadow (target area). After negative aspiration for blood, 36mL of the nerve block solution was injected without difficulty or complication. The needle was removed intact. L5 Medial Branch Nerve Block (MBB): The target area for the L5 medial branch is at the junction of the postero-lateral aspect of the superior articular process and the superior, posterior, and medial edge of the sacral ala. Under fluoroscopic guidance, a Quincke needle was inserted until contact was made with os over the superior postero-lateral aspect of the pedicular shadow (target area). After negative aspiration for blood, 52mL of the nerve block solution was injected without difficulty or complication. The needle was removed intact.   Nerve block solution: 15 cc solution made of 13 cc of 0.2% ropivacaine, 2 cc of Decadron 10 mg/cc.  2 cc injected at each level above bilaterally.  The unused portion of the solution was discarded in the proper designated containers. Procedural Needles: 22-gauge, 3.5-inch, Quincke needles used for all levels.  Once the entire procedure was completed, the treated area was cleaned, making sure to leave some of the prepping solution back to take advantage of its long term bactericidal properties.   Illustration of the posterior view of the lumbar spine and the posterior neural structures. Laminae of L2 through S1 are labeled. DPRL5, dorsal primary ramus of L5; DPRS1, dorsal primary ramus of S1; DPR3, dorsal primary ramus of L3; FJ, facet (zygapophyseal) joint L3-L4; I, inferior articular process of L4; LB1, lateral branch of dorsal primary ramus of L1; IAB, inferior articular branches from L3 medial branch (supplies L4-L5 facet joint); IBP,  intermediate branch plexus; MB3, medial branch of dorsal primary ramus of L3; NR3, third lumbar nerve root; S, superior articular process of L5; SAB, superior articular branches from L4 (supplies L4-5 facet joint also); TP3, transverse process of L3.  Vitals:   12/03/19 1027 12/03/19 1029 12/03/19 1034 12/03/19 1040  BP: (!) 207/80 (!) 208/79 (!) 202/76 (!) 198/76  Pulse:      Resp:    16  Temp:      TempSrc:      SpO2:    100%  Weight:      Height:         Start Time: 0948 hrs. End Time: 1002 hrs.  Imaging Guidance (Spinal):          Type of Imaging Technique: Fluoroscopy Guidance (Spinal) Indication(s): Assistance in needle guidance and placement for procedures requiring needle placement in or near specific anatomical locations not easily accessible without such assistance. Exposure Time: Please see nurses notes. Contrast: None used. Fluoroscopic Guidance: I was personally present during the use of fluoroscopy. "Tunnel Vision Technique" used to obtain the best possible view of the target area. Parallax error corrected before commencing the procedure. "Direction-depth-direction" technique used to introduce the needle under continuous pulsed fluoroscopy. Once target was reached, antero-posterior, oblique, and lateral fluoroscopic projection used confirm needle placement in all planes. Images permanently stored in EMR. Interpretation: No contrast injected. I personally interpreted the imaging intraoperatively. Adequate needle placement confirmed in multiple planes. Permanent images saved into the patient's record.  Antibiotic Prophylaxis:   Anti-infectives (From admission, onward)   None     Indication(s): None identified  Post-operative Assessment:  Post-procedure Vital Signs:  Pulse/HCG Rate: 6470 Temp: (!) 97.5 F (36.4 C) Resp: 16 BP: (!) 198/76 (Dr Holley Raring notified.  Patient instructed to follow up with PC) SpO2: 100 %  EBL: None  Complications: No immediate  post-treatment complications observed by team, or reported by patient.  Note: The patient tolerated the entire procedure well. A repeat set of vitals were taken after the procedure and the patient was kept under observation following institutional policy, for this type of procedure. Post-procedural neurological assessment was performed, showing return to baseline, prior to discharge. The patient was provided with post-procedure discharge instructions, including a section on how to identify potential problems. Should any problems arise concerning this procedure, the patient was given instructions to immediately contact us, at any time, without hesitation. In any case, we plan to contact the patient by telephone for a follow-up status report regarding this interventional procedure.  Comments:  No additional relevant information.  Medications ordered for procedure: Meds ordered this encounter  Medications  . lidocaine (XYLOCAINE) 2 % (with pres) injection 400 mg  . fentaNYL (SUBLIMAZE) injection 25-50 mcg    Make sure Narcan is available in the pyxis when using this medication. In the event of respiratory depression (RR< 8/min): Titrate NARCAN (naloxone) in increments of 0.1 to 0.2 mg IV at 2-3 minute intervals, until desired degree of reversal.  . ropivacaine (PF) 2 mg/mL (0.2%) (NAROPIN) injection 9 mL  . dexamethasone (DECADRON) injection 10 mg  . dexamethasone (DECADRON) injection 10 mg  . ropivacaine (PF) 2 mg/mL (0.2%) (NAROPIN) injection 9 mL   Medications administered: We administered lidocaine, fentaNYL, ropivacaine (PF) 2 mg/mL (0.2%), dexamethasone, dexamethasone, and ropivacaine (PF) 2 mg/mL (0.2%).  See the medical record for exact dosing, route, and time of administration.  Follow-up plan:   Return in about 4 weeks (around 12/31/2019) for Post Procedure Evaluation, virtual.    Recent Visits Date Type Provider Dept  11/06/19 Office Visit Gillis Santa, MD [facility]    Showing recent visits within past 90 days and meeting all other requirements Today's Visits Date Type Provider Dept  12/03/19 Procedure visit Gillis Santa, MD [facility]  Showing today's  visits and meeting all other requirements Future Appointments Date Type Provider Dept  12/31/19 Appointment Gillis Santa, MD [facility]  Showing future appointments within next 90 days and meeting all other requirements  Disposition: Discharge home  Discharge (Date  Time): 12/03/2019; 1043 hrs.   Primary Care Physician: Baxter Hire, MD Location: [facility] Note by: Gillis Santa, MD Date: 12/03/2019; Time: 12:18 PM  Disclaimer:  Medicine is not an exact science. The only guarantee in medicine is that nothing is guaranteed. It is important to note that the decision to proceed with this intervention was based on the information collected from the patient. The Data and conclusions were drawn from the patient's questionnaire, the interview, and the physical examination. Because the information was provided in large part by the patient, it cannot be guaranteed that it has not been purposely or unconsciously manipulated. Every effort has been made to obtain as much relevant data as possible for this evaluation. It is important to note that the conclusions that lead to this procedure are derived in large part from the available data. Always take into account that the treatment will also be dependent on availability of resources and existing treatment guidelines, considered by other Pain Management Practitioners as being common knowledge and practice, at the time of the intervention. For Medico-Legal purposes, it is also important to point out that variation in procedural techniques and pharmacological choices are the acceptable norm. The indications, contraindications, technique, and results of the above procedure should only be interpreted and judged by a  Board-Certified Interventional Pain Specialist with extensive familiarity and expertise in the same exact procedure and technique.

## 2019-12-03 NOTE — Progress Notes (Signed)
Safety precautions to be maintained throughout the outpatient stay will include: orient to surroundings, keep bed in low position, maintain call bell within reach at all times, provide assistance with transfer out of bed and ambulation.  

## 2019-12-04 ENCOUNTER — Telehealth: Payer: Self-pay

## 2019-12-04 NOTE — Telephone Encounter (Signed)
Post procedure phone call.  Patient states she is doing ok, but hasnt really gotten up moving today.  She will call us back if she has any questions or concerns.

## 2019-12-31 ENCOUNTER — Telehealth: Payer: Medicare PPO | Admitting: Student in an Organized Health Care Education/Training Program

## 2020-01-02 ENCOUNTER — Other Ambulatory Visit: Payer: Self-pay

## 2020-01-02 ENCOUNTER — Telehealth: Payer: Self-pay | Admitting: *Deleted

## 2020-01-02 ENCOUNTER — Ambulatory Visit
Payer: Medicare PPO | Attending: Student in an Organized Health Care Education/Training Program | Admitting: Student in an Organized Health Care Education/Training Program

## 2020-01-02 ENCOUNTER — Encounter: Payer: Self-pay | Admitting: Student in an Organized Health Care Education/Training Program

## 2020-01-02 DIAGNOSIS — Z79891 Long term (current) use of opiate analgesic: Secondary | ICD-10-CM

## 2020-01-02 DIAGNOSIS — M47816 Spondylosis without myelopathy or radiculopathy, lumbar region: Secondary | ICD-10-CM

## 2020-01-02 DIAGNOSIS — B0229 Other postherpetic nervous system involvement: Secondary | ICD-10-CM | POA: Diagnosis not present

## 2020-01-02 DIAGNOSIS — G894 Chronic pain syndrome: Secondary | ICD-10-CM

## 2020-01-02 DIAGNOSIS — M47894 Other spondylosis, thoracic region: Secondary | ICD-10-CM

## 2020-01-02 DIAGNOSIS — M792 Neuralgia and neuritis, unspecified: Secondary | ICD-10-CM

## 2020-01-02 DIAGNOSIS — M5414 Radiculopathy, thoracic region: Secondary | ICD-10-CM

## 2020-01-02 MED ORDER — PREGABALIN 100 MG PO CAPS: 100.0000 mg | ORAL_CAPSULE | Freq: Two times a day (BID) | ORAL | 5 refills | Status: DC

## 2020-01-02 MED ORDER — HYDROCODONE-ACETAMINOPHEN 10-325 MG PO TABS: 1.0000 | ORAL_TABLET | Freq: Three times a day (TID) | ORAL | 0 refills | Status: AC | PRN

## 2020-01-02 NOTE — Addendum Note (Signed)
Addended by: Gillis Santa on: 01/02/2020 04:02 PM   Modules accepted: Orders

## 2020-01-02 NOTE — Progress Notes (Signed)
Patient: Autumn [name]  Service Category: E/M  Provider: Gillis Santa, MD  DOB: Aug 10, 1945  DOS: 01/02/2020  Location: Office  MRN: [id number]  Setting: Ambulatory outpatient  Referring Provider: Baxter Hire, MD  Type: Established Patient  Specialty: Interventional Pain Management  PCP: Baxter Hire, MD  Location: Home  Delivery: TeleHealth     Virtual Encounter - Pain Management PROVIDER NOTE: Information contained herein reflects review and annotations entered in association with encounter. Interpretation of such information and data should be left to medically-trained personnel. Information provided to patient can be located elsewhere in the medical record under "Patient Instructions". Document created using STT-dictation technology, any transcriptional errors that may result from process are unintentional.    Contact & Pharmacy Preferred: [phone] Home: [phone] (home) Mobile: [phone] (mobile) E-mail: [email or web address]  [id number] [place] [address]: [phone]: [phone]  Pre-screening  [name] "in-person" vs "virtual" encounter. She indicated preferring virtual for this encounter.   Reason COVID-19*  Social distancing based on CDC and AMA recommendations.   I contacted [name] 01/02/2020 via video conference.      I clearly identified myself as BGillis Santa MD. I verified that I was speaking with the correct person using two identifiers (Name: HTHARON KITCH and date of birth: 101-07-1945.  Consent I sought verbal advanced consent from HGwenith Spitzfor virtual visit interactions. I informed Ms. CBegayof possible security and privacy concerns, risks, and limitations associated with providing "not-in-person" medical evaluation and management services. I also informed Ms. CBeckstrandof the availability of "in-person" appointments. Finally, I informed her that  there would be a charge for the virtual visit and that she could be  personally, fully or partially, financially responsible for it. Ms. CBewickexpressed understanding and agreed to proceed.   Historic Elements   Ms. HLESA VANDALLis a 74y.o. year old, female patient evaluated today after our last contact on 12/03/2019. Ms. CLoughmiller has a past medical history of Allergy, Anemia, COPD (chronic obstructive pulmonary disease) ([place], Glaucoma, H/O uvulectomy, Hypertension, Osteoporosis, Ovarian cancer ([place], Postherpetic neuralgia, Pulmonary sarcoidosis ([place], and Sarcoid. She also  has a past surgical history that includes Abdominal hysterectomy; Dilation and curettage of uterus; Vulvar lesion removal; Diagnostic laparoscopy; Eye surgery; Colonoscopy with propofol (N/A, 07/30/2019); and Breast excisional biopsy (Right). Ms. CEberweinhas a current medication list which includes the following prescription(s): acetaminophen, atenolol, atorvastatin, calcium 600+d3, cidofovir, denosumab, fluticasone, trelegy ellipta, ipratropium-albuterol, montelukast, prednisone, propylene glycol, sertraline, hydrocodone-acetaminophen, fish oil, and pregabalin. She  reports that she has never smoked. She has never used smokeless tobacco. She reports that she does not drink alcohol and does not use drugs. Ms. CTalwaris allergic to ivp dye [iodinated diagnostic agents].   HPI  Today, she is being contacted for  PP eval and MM.  Post-Procedure Evaluation  Procedure (12/03/2019):   Type: Lumbar Facet, Medial Branch Block(s) #1  Primary Purpose: Diagnostic Region: Posterolateral Lumbosacral Spine Level:  L3, L4, L5,  Medial Branch Level(s). Injecting these levels blocks the L3-4, L4-5,  lumbar facet joints. Laterality: Bilateral  Sedation: Please see nurses note.  Effectiveness during initial hour after procedure(Ultra-Short Term Relief): 10 %   Local anesthetic used: Long-acting (4-6 hours) Effectiveness: Defined as any  analgesic benefit obtained secondary to the administration of local anesthetics. This carries significant diagnostic value as to the etiological location, or anatomical origin,  of the pain. Duration of benefit is expected to coincide with the duration of the local anesthetic used.  Effectiveness during initial 4-6 hours after procedure(Short-Term Relief): 10 %   Long-term benefit: Defined as any relief past the pharmacologic duration of the local anesthetics.  Effectiveness past the initial 6 hours after procedure(Long-Term Relief): 0 %   Current benefits: Defined as benefit that persist at this time.   Analgesia:  No benefit Function: No benefit ROM: No benefit   Pharmacotherapy Assessment   08/21/2019  1   05/01/2019  Hydrocodone-Acetamin 10-325 MG  90.00  30 Bi Lat   [id number]   Nor (0921)   0/0  30.00 MME  Medicare   [place]     Analgesic: Hydrocodone 1-2 tablets daily prn  Monitoring:  PMP: PDMP reviewed during this encounter.       Pharmacotherapy: No side-effects or adverse reactions reported. Compliance: No problems identified. Effectiveness: Clinically acceptable. Plan: Refer to "POC".  UDS:  Summary  Date Value Ref Range Status  03/22/2017 FINAL  Final    Comment:    ==================================================================== TOXASSURE COMP DRUG ANALYSIS,UR ==================================================================== Test                             Result       Flag       Units Drug Present and Declared for Prescription Verification   Buprenorphine                  23           EXPECTED   ng/mg creat   Norbuprenorphine               112          EXPECTED   ng/mg creat    Source of buprenorphine is a scheduled prescription medication.    Norbuprenorphine is an expected metabolite of buprenorphine.   Gabapentin                     PRESENT      EXPECTED   Sertraline                     PRESENT      EXPECTED   Desmethylsertraline            PRESENT       EXPECTED    Desmethylsertraline is an expected metabolite of sertraline.   Atenolol                       PRESENT      EXPECTED Drug Absent but Declared for Prescription Verification   Hydrocodone                    Not Detected UNEXPECTED ng/mg creat   Tramadol                       Not Detected UNEXPECTED ng/mg creat   Acetaminophen                  Not Detected UNEXPECTED    Acetaminophen, as indicated in the declared medication list, is    not always detected even when used as directed.   Ibuprofen                      Not Detected UNEXPECTED    Ibuprofen, as indicated in  the declared medication list, is not    always detected even when used as directed.   Diphenhydramine                Not Detected UNEXPECTED ==================================================================== Test                      Result    Flag   Units      Ref Range   Creatinine              182              mg/dL      >=20 ==================================================================== Declared Medications:  The flagging and interpretation on this report are based on the  following declared medications.  Unexpected results may arise from  inaccuracies in the declared medications.  **Note: The testing scope of this panel includes these medications:  Atenolol  Buprenorphine  Diphenhydramine  Gabapentin  Hydrocodone (Hydrocodone-Acetaminophen)  Sertraline  Tramadol  **Note: The testing scope of this panel does not include small to  moderate amounts of these reported medications:  Acetaminophen (Hydrocodone-Acetaminophen)  Ibuprofen  **Note: The testing scope of this panel does not include following  reported medications:  Albuterol  Amoxicillin (Augmentin)  Atorvastatin  Azithromycin  Brimonidine Tartrate  Brinzolamide  Calcium (Calcium/Vitamin D3)  Clavulinate (Augmentin)  Fluticasone (Advair)  Latanoprost  Omega-3 Fatty Acids  Ondansetron  Prednisone  Salmeterol (Advair)  Tiotropium   Topical  Vitamin D3 (Calcium/Vitamin D3) ==================================================================== For clinical consultation, please call [phone]. ====================================================================     Laboratory Chemistry Profile   Renal Lab Results  Component Value Date   BUN 28 (H) 02/13/2018   CREATININE 0.68 02/13/2018   GFRAA >60 02/13/2018   GFRNONAA >60 02/13/2018     Hepatic Lab Results  Component Value Date   AST 21 03/29/2015   ALT 20 03/29/2015   ALBUMIN 4.4 03/29/2015   ALKPHOS 46 03/29/2015   LIPASE 116 12/15/2013     Electrolytes Lab Results  Component Value Date   NA 145 02/13/2018   K 4.6 02/13/2018   CL 100 02/13/2018   CALCIUM 10.0 02/13/2018   MG 2.7 (H) 02/07/2018   PHOS 3.2 02/07/2018     Bone No results found for: VD25OH, VD125OH2TOT, SH7026VZ8, HY8502DX4, 25OHVITD1, 25OHVITD2, 25OHVITD3, TESTOFREE, TESTOSTERONE   Inflammation (CRP: Acute Phase) (ESR: Chronic Phase) Lab Results  Component Value Date   ESRSEDRATE 48 (H) 05/25/2011       Note: Above Lab results reviewed.  Imaging  DG PAIN CLINIC C-ARM 1-60 MIN NO REPORT Fluoro was used, but no Radiologist interpretation will be provided.  Please refer to "NOTES" tab for provider progress note.  Assessment  The primary encounter diagnosis was Lumbar facet arthropathy. Diagnoses of Spondylosis without myelopathy or radiculopathy, lumbar region, Chronic pain syndrome, Neuropathic pain, Postinfective intercostal neuralgia, Post herpetic neuralgia, Thoracic facet joint syndrome, Chronic use of opiate for therapeutic purpose, and Thoracic radiculopathy were also pertinent to this visit.  Plan of Care  Ms. ELISABELLA HACKER has a current medication list which includes the following long-term medication(s): atenolol, atorvastatin, calcium 600+d3, fluticasone, ipratropium-albuterol, montelukast, sertraline, and pregabalin.  Pharmacotherapy (Medications  Ordered): Meds ordered this encounter  Medications  . HYDROcodone-acetaminophen (NORCO) 10-325 MG tablet    Sig: Take 1 tablet by mouth every 8 (eight) hours as needed for moderate pain. For chronic pain syndrome    Dispense:  90 tablet    Refill:  0  . pregabalin (LYRICA) 100  MG capsule    Sig: Take 1 capsule (100 mg total) by mouth 2 (two) times daily.    Dispense:  60 capsule    Refill:  5    Fill one day early if pharmacy is closed on scheduled refill date. May substitute for generic if available.   Topical therapy with Voltaren gel and 4% lidocaine.  Follow-up plan:   Return in about 3 months (around 04/02/2020) for Medication Management, in person.   Recent Visits Date Type Provider Dept  12/03/19 Procedure visit Gillis Santa, MD [facility]  11/06/19 Office Visit Gillis Santa, MD [facility]  Showing recent visits within past 90 days and meeting all other requirements Today's Visits Date Type Provider Dept  01/02/20 Telemedicine Gillis Santa, MD [facility]  Showing today's visits and meeting all other requirements Future Appointments No visits were found meeting these conditions. Showing future appointments within next 90 days and meeting all other requirements  I discussed the assessment and treatment plan with the patient. The patient was provided an opportunity to ask questions and all were answered. The patient agreed with the plan and demonstrated an understanding of the instructions.  Patient advised to call back or seek an in-person evaluation if the symptoms or condition worsens.  Duration of encounter:30 minutes.  Note by: Gillis Santa, MD Date: 01/02/2020; Time: 2:29 PM

## 2020-01-02 NOTE — Telephone Encounter (Signed)
I attempted to call her to ask for what problem she needs PT. She did not answer the phone. Do you know, will you please order if you do?

## 2020-01-03 NOTE — Telephone Encounter (Signed)
PA completed on CMM on 01/02/20.  Approval received on 01/03/20 for hydrocodone apap 10-325 mg.

## 2020-02-11 ENCOUNTER — Ambulatory Visit: Payer: Medicare PPO | Admitting: Physical Therapy

## 2020-02-12 ENCOUNTER — Ambulatory Visit: Payer: Medicare PPO | Admitting: Physical Therapy

## 2020-02-13 ENCOUNTER — Encounter: Payer: Medicare PPO | Admitting: Physical Therapy

## 2020-02-14 ENCOUNTER — Encounter: Payer: Medicare PPO | Admitting: Physical Therapy

## 2020-02-18 ENCOUNTER — Encounter: Payer: Medicare PPO | Admitting: Physical Therapy

## 2020-02-20 ENCOUNTER — Encounter: Payer: Medicare PPO | Admitting: Physical Therapy

## 2020-02-25 ENCOUNTER — Encounter: Payer: Medicare PPO | Admitting: Physical Therapy

## 2020-02-27 ENCOUNTER — Encounter: Payer: Medicare PPO | Admitting: Physical Therapy

## 2020-03-03 ENCOUNTER — Encounter: Payer: Medicare PPO | Admitting: Physical Therapy

## 2020-03-05 ENCOUNTER — Encounter: Payer: Medicare PPO | Admitting: Physical Therapy

## 2020-03-10 ENCOUNTER — Encounter: Payer: Medicare PPO | Admitting: Physical Therapy

## 2020-03-24 ENCOUNTER — Ambulatory Visit: Payer: Medicare PPO | Admitting: Physical Therapy

## 2020-03-24 ENCOUNTER — Ambulatory Visit
Payer: Medicare PPO | Attending: Student in an Organized Health Care Education/Training Program | Admitting: Physical Therapy

## 2020-03-24 ENCOUNTER — Other Ambulatory Visit: Payer: Self-pay

## 2020-03-24 DIAGNOSIS — G8929 Other chronic pain: Secondary | ICD-10-CM | POA: Diagnosis present

## 2020-03-24 DIAGNOSIS — R2689 Other abnormalities of gait and mobility: Secondary | ICD-10-CM | POA: Diagnosis present

## 2020-03-24 DIAGNOSIS — M545 Low back pain, unspecified: Secondary | ICD-10-CM | POA: Diagnosis present

## 2020-03-24 NOTE — Therapy (Signed)
[facility] [address] [place], [address] Phone: [phone]   Fax:  [phone]  Physical Therapy Treatment  Patient Details  Name: Autumn [name] MRN: [id number] Date of Birth: 19-Jun-1945 No data recorded  Encounter Date: 03/24/2020   PT End of Session - 03/24/20 1445    Visit Number 1    Number of Visits 17    Date for PT Re-Evaluation 05/16/20    PT Start Time 0230    PT Stop Time 0315    PT Time Calculation (min) 45 min    Equipment Utilized During Treatment Gait belt    Activity Tolerance Patient tolerated treatment well    Behavior During Therapy WFL for tasks assessed/performed           Past Medical History:  Diagnosis Date  . Allergy   . Anemia   . COPD (chronic obstructive pulmonary disease) ([place])   . Glaucoma   . H/O uvulectomy   . Hypertension   . Osteoporosis   . Ovarian cancer ([place])   . Postherpetic neuralgia   . Pulmonary sarcoidosis ([place])   . Sarcoid     Past Surgical History:  Procedure Laterality Date  . ABDOMINAL HYSTERECTOMY    . BREAST EXCISIONAL BIOPSY Right    benign  . COLONOSCOPY WITH PROPOFOL N/A 07/30/2019   Procedure: COLONOSCOPY WITH PROPOFOL;  Surgeon: Toledo, Benay Pike, MD;  Location: [facility];  Service: Gastroenterology;  Laterality: N/A;  . DIAGNOSTIC LAPAROSCOPY    . DILATION AND CURETTAGE OF UTERUS    . EYE SURGERY    . VULVAR LESION REMOVAL      There were no vitals filed for this visit.   Subjective Assessment - 03/24/20 1434    Pertinent History Pt is a 74 year old female presenting with acute on chronic LBP with insideous onset a year ago. Reports she finds it difficult to walk upright d/t back pain, and can only walk 64mins. Pain is increased by all movements but she especially notices it when she is cooking and completing household tasks. Patient cannot see out of her R eye and has glaucoma in the L so her husband drives her to  appointments and to church on Sundays. She has ramp at church, and only 2 steps to enter/exit her one story home that she is able to do without assistance with rail. Pt uses rollator in the community and occassional SPC in home. One chronic 2L O2 that she increases to 3 if she feels short of breath. Denies any falls in the past 6 months. LBP is across low back, denies radiating symptoms or numbness/tingling. Reports pain is 8/10 at its worst, and 0/10 at it's best when she has her pain medication. Currently 7/10. Pt denies N/V, B&B changes, unexplained weight fluctuation, saddle paresthesia, fever, night sweats, or unrelenting night pain at this time.    Limitations Walking    How long can you sit comfortably? unlimited "feels good"    How long can you stand comfortably? 97mins    How long can you walk comfortably? 66mins    Patient Stated Goals stand up tall and walk faster, without walker    Currently in Pain? Yes    Pain Location Back    Pain Orientation Lower;Mid    Pain Descriptors / Indicators Aching;Burning;Sharp    Pain Type Chronic pain    Pain Radiating Towards none    Pain Onset More than a month ago  Pain Frequency Intermittent    Aggravating Factors  standing, walking, lifting, cooking    Pain Relieving Factors sitting, pain medication    Effect of Pain on Daily Activities unable to complete household ADLs.                 OBJECTIVE  Mental Status Patient is oriented to person, place and time.  Recent memory is intact.  Remote memory is intact.  Attention span and concentration are intact.  Expressive speech is intact.  Patient's fund of knowledge is within normal limits for educational level.  SENSATION: Grossly intact to light touch bilateral LEs as determined by testing dermatomes L2-S2 Proprioception and hot/cold testing deferred on this date   MUSCULOSKELETAL: Tremor: None Bulk: Normal Tone: Normal No visible step-off along spinal  column  Posture Lumbar lordosis: WNL Iliac crest height: equal bilaterally Lumbar lateral shift:postive Lower crossed syndrome (tight hip flexors and erector spinae; weak gluts and abs): Positive Thoracolumbar dextroscoliosis with R shoulder and L hip hike  Gait Decreased hip ext bilat, decreased heel strike bilat with near shuffle gait, maintains scoliotic posture throughout gait   Palpation TTP over L4-S1 to minimal pressure. Tension at L QL and lumbar paraspinals   Strength (out of 5) R/L 4/4 Hip flexion 3/3 Hip ER 4+/4+ Hip IR 5/5 Hip abduction 4+/4+ Hip adduction 3-/3- Hip extension 4-/4- Knee extension 3+/3+ Knee flexion 4/4 Ankle dorsiflexion 5/5 Ankle plantarflexion 5 Trunk flexion 5 Trunk extension 5/5 Trunk rotation  *Indicates pain   AROM (degrees) R/L (all movements include overpressure unless otherwise stated) Minimal bilat hip ext bilat, and hip IR (<10d bilat) Lumbar motions complicated by scolosis, normal flex, decreased ext *Indicates pain   PROM (degrees) PROM = AROM  Repeated Movements No centralization or peripheralization of symptoms with repeated lumbar extension or flexion.    Muscle Length Hamstrings: shortened bilat Ely: shortened bilat Thomas: shortened bilat Ober: normal bilat     SPECIAL TESTS Lumbar Radiculopathy and Discogenic: Centralization and Peripheralization (SN 92, -LR 0.12): Negative bilat Slump (SN 83, -LR 0.32): Negative bilat SLR (SN 92, -LR 0.29):Negative bilat  Facet Joint: Extension-Rotation (SN 100, -LR 0.0): Negative bilat  Lumbar Spinal Stenosis: Lumbar quadrant (SN 70): Negative bilat  Hip: FABER (SN 81):Negative bilat FADIR (SN 94): Negative bilat Hip scour (SN 50): Positive bilat   SIJ:  Thigh Thrust (SN 88, -LR 0.18) : Negative bilat  Piriformis Syndrome: FAIR Test (SN 88, SP 83): Negative bilat  Functional Tasks STS and all bed mobility modI with increased time needed to complete  5xSTS  21.3sec 10MWT with rollator: 0.84m/s without AD: 0.67m/s  Ther-Ex PT reviewed the following HEP with patient with patient able to demonstrate a set of the following with min cuing for correction needed. PT educated patient on parameters of therex (how/when to inc/decrease intensity, frequency, rep/set range, stretch hold time, and purpose of therex) with verbalized understanding.  Access Code: [email or web address] Lying Prone - 1-2 x daily - 7 x weekly - 36mins hold Sit to Stand - 1 x daily - 7 x weekly - 3 sets - 5-10 reps Modified Thomas Stretch - 3 x daily - 7 x weekly - 30sec hold                         PT Education - 03/24/20 1444    Education Details Patient was educated on diagnosis, anatomy and pathology involved, prognosis, role of PT, and was given an HEP, demonstrating exercise with  proper form following verbal and tactile cues, and was given a paper hand out to continue exercise at home. Pt was educated on and agreed to plan of care.    Person(s) Educated Patient    Methods Explanation;Demonstration;Tactile cues;Verbal cues    Comprehension Verbalized understanding;Returned demonstration;Verbal cues required;Tactile cues required            PT Short Term Goals - 03/25/20 1410      PT SHORT TERM GOAL #1   Title Pt will be independent with HEP in order to improve strength and decrease back pain in order to improve pain-free function at home and work.    Baseline 03/25/20 HEP given    Time 4    Period Weeks    Status New             PT Long Term Goals - 03/25/20 1411      PT LONG TERM GOAL #1   Title Pt will decrease worst back pain as reported on NPRS by at least 2 points in order to demonstrate clinically significant reduction in back pain.    Baseline 03/25/20 8/10    Time 8    Period Weeks    Status New      PT LONG TERM GOAL #2   Title Pt will increase 10MWT by at least 0.22m/s in order to demonstrate safe limited community speed and speed of  independence with iADLs    Baseline 03/25/20 with rollator 0.22m/s    Time 8    Period Weeks    Status New      PT LONG TERM GOAL #3   Title Pt will decrease 5TSTS by at least 3 seconds in order to demonstrate clinically significant improvement in LE strength    Baseline 03/25/20 21.3sec    Time 8    Period Weeks    Status On-going      PT LONG TERM GOAL #4   Title Patient will increase FOTO score to 79 to demonstrate predicted increase in functional mobility to complete ADLs    Baseline 03/24/20 to be completed next visit    Time 8    Period Weeks    Status New                 Plan - 03/25/20 1428    Clinical Impression Statement Pt is a 74 year old female presenting with acute on chronic episode of LBP without sciatica. Impairments in decreased hip mobility (ext and IR bilat), decreased lumbar mobility (mobility complicated by scolosis, apparent deficit in ext), decreased activity tolerance, abnormal gait, decreased hip and core strength, and pain. Activity limitations in prolonged walking and standing, squatting/stooping, cooking, cleaning, and community ambulation limiting parcitipation in household ADLs and safe community navigation. Pt will benefit from skilled PT to address impairments in order to increase QOL and return to safe, independent ADLs and community activity.    Personal Factors and Comorbidities Age;Comorbidity 1;Comorbidity 2;Comorbidity 3+;Past/Current Experience;Sex;Fitness;Time since onset of injury/illness/exacerbation    Comorbidities HTN, osteoporosis, COPD    Examination-Activity Limitations Lift;Stand;Transfers;Squat;Stairs;Locomotion Level    Examination-Participation Restrictions Community Activity;Laundry;Cleaning    Stability/Clinical Decision Making Evolving/Moderate complexity    Clinical Decision Making Moderate    Rehab Potential Good    PT Frequency 2x / week    PT Duration 8 weeks    PT Treatment/Interventions Electrical Stimulation;Moist  Heat;Traction;Ultrasound;Gait training;Therapeutic activities;DME Instruction;Neuromuscular re-education;Manual techniques;Spinal Manipulations;Joint Manipulations;Passive range of motion;Dry needling;Patient/family education;Balance training;Functional mobility training;Therapeutic exercise;Cryotherapy;ADLs/Self Care Home Management;Stair training;Energy conservation  PT Next Visit Plan FOTO 6MWT, HEP review, increased ext strengthening/mobility, gait training    PT Home Exercise Plan lying prone, STS, modified thomas stretch    Consulted and Agree with Plan of Care Patient           Patient will benefit from skilled therapeutic intervention in order to improve the following deficits and impairments:  Cardiopulmonary status limiting activity,Decreased balance,Decreased coordination,Decreased mobility,Difficulty walking,Impaired flexibility,Impaired tone,Postural dysfunction,Abnormal gait,Decreased activity tolerance,Decreased endurance,Decreased range of motion,Decreased strength,Increased fascial restricitons,Hypomobility,Pain,Improper body mechanics  Visit Diagnosis: Chronic bilateral low back pain without sciatica  Other abnormalities of gait and mobility     Problem List Patient Active Problem List   Diagnosis Date Noted  . Spondylosis without myelopathy or radiculopathy, lumbar region 11/06/2019  . 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 Chronic use of opiate for therapeutic purpose 12/14/2018  . Chronic pain syndrome 12/14/2018  . Thoracic radiculopathy 12/14/2018  . Acute on chronic respiratory failure with hypoxia ([place])   . Palliative care encounter   . Protein-calorie malnutrition, severe 02/06/2018  . Acute respiratory failure ([place]) 02/04/2018  . COPD (chronic obstructive pulmonary disease) ([place]) 03/22/2017  . Post herpetic neuralgia 07/30/2016  . Pneumonia 11/17/2015  . Long term current use of systemic steroids 02/14/2015  . Pulmonary hypertension ([place])  10/05/2013  . Pulmonary sarcoidosis ([place]) 10/11/1999   Durwin Reges DPT Durwin Reges 03/25/2020, 2:42 PM  [facility] [address] [place], [address] Phone: [phone]   Fax:  [phone]  Name: Autumn [name] MRN: [id number] Date of Birth: 05-22-1945

## 2020-03-26 ENCOUNTER — Ambulatory Visit: Payer: Medicare PPO | Admitting: Physical Therapy

## 2020-03-28 ENCOUNTER — Encounter: Payer: Self-pay | Admitting: Physical Therapy

## 2020-03-28 ENCOUNTER — Other Ambulatory Visit: Payer: Self-pay

## 2020-03-28 ENCOUNTER — Ambulatory Visit: Payer: Medicare PPO | Admitting: Physical Therapy

## 2020-03-28 DIAGNOSIS — M545 Low back pain, unspecified: Secondary | ICD-10-CM | POA: Diagnosis not present

## 2020-03-28 DIAGNOSIS — G8929 Other chronic pain: Secondary | ICD-10-CM

## 2020-03-28 DIAGNOSIS — R2689 Other abnormalities of gait and mobility: Secondary | ICD-10-CM

## 2020-03-28 NOTE — Therapy (Signed)
[place] [facility] [address], [place], [address] Phone: [phone]   Fax:  [phone]  Physical Therapy Treatment  Patient Details  Name: Autumn [name] MRN: [id number] Date of Birth: 20-Jul-1945 No data recorded  Encounter Date: 03/28/2020   PT End of Session - 03/28/20 0918    Visit Number 2    Number of Visits 17    Date for PT Re-Evaluation 05/16/20    Authorization - Visit Number 2    Authorization - Number of Visits 10    PT Start Time 0912    PT Stop Time 1000    PT Time Calculation (min) 48 min    Equipment Utilized During Treatment Gait belt    Activity Tolerance Patient tolerated treatment well    Behavior During Therapy [facility] for tasks assessed/performed           Past Medical History:  Diagnosis Date  . Allergy   . Anemia   . COPD (chronic obstructive pulmonary disease) ([place])   . Glaucoma   . H/O uvulectomy   . Hypertension   . Osteoporosis   . Ovarian cancer ([place])   . Postherpetic neuralgia   . Pulmonary sarcoidosis ([place])   . Sarcoid     Past Surgical History:  Procedure Laterality Date  . ABDOMINAL HYSTERECTOMY    . BREAST EXCISIONAL BIOPSY Right    benign  . COLONOSCOPY WITH PROPOFOL N/A 07/30/2019   Procedure: COLONOSCOPY WITH PROPOFOL;  Surgeon: Toledo, Benay Pike, MD;  Location: [facility] ENDOSCOPY;  Service: Gastroenterology;  Laterality: N/A;  . DIAGNOSTIC LAPAROSCOPY    . DILATION AND CURETTAGE OF UTERUS    . EYE SURGERY    . VULVAR LESION REMOVAL      There were no vitals filed for this visit.   Subjective Assessment - 03/28/20 0916    Subjective Pt reports no pain "other than the usual" this am. Had a slight headache when she woke up this am, but it is doing better. She is completing HEP, reports exercises are not too difficult.    Pertinent History Pt is a 74 year old female presenting with acute on chronic LBP with insideous onset a year ago. Reports she finds it  difficult to walk upright d/t back pain, and can only walk 93mins. Pain is increased by all movements but she especially notices it when she is cooking and completing household tasks. Patient cannot see out of her R eye and has glaucoma in the L so her husband drives her to appointments and to church on Sundays. She has ramp at church, and only 2 steps to enter/exit her one story home that she is able to do without assistance with rail. Pt uses rollator in the community and occassional SPC in home. One chronic 2L O2 that she increases to 3 if she feels short of breath. Denies any falls in the past 6 months. LBP is across low back, denies radiating symptoms or numbness/tingling. Reports pain is 8/10 at its worst, and 0/10 at it's best when she has her pain medication. Currently 7/10. Pt denies N/V, B&B changes, unexplained weight fluctuation, saddle paresthesia, fever, night sweats, or unrelenting night pain at this time.    Limitations Walking    How long can you sit comfortably? unlimited "feels good"    How long can you stand comfortably? 60mins    How long can you walk comfortably? 66mins    Patient Stated Goals stand up tall and  walk faster, without walker    Pain Onset More than a month ago             Ther-Ex Lying Prone 24min; prone propped 22min; x2  Bridge 3x 5 with cuing for full ext with good carry over Sit to Stand From elevated mat table 3x 10 with min cuing for full availible hip ext with good carry over Standing hip ext 3x 10 bilat with cuing for posture consistently with carry over Forward flex onto blue theraball with thoracic  78min Visual review of Modified Thomas Stretch         6MWT 450ft 2L O2 with O2 monitored throughout 99%                          PT Education - 03/28/20 0918    Education Details therex form/technique, HEP review    Person(s) Educated Patient    Methods Explanation;Demonstration;Verbal cues    Comprehension Verbalized  understanding;Returned demonstration;Verbal cues required            PT Short Term Goals - 03/25/20 1410      PT SHORT TERM GOAL #1   Title Pt will be independent with HEP in order to improve strength and decrease back pain in order to improve pain-free function at home and work.    Baseline 03/25/20 HEP given    Time 4    Period Weeks    Status New             PT Long Term Goals - 03/28/20 0930      PT LONG TERM GOAL #1   Title Pt will decrease worst back pain as reported on NPRS by at least 2 points in order to demonstrate clinically significant reduction in back pain.    Baseline 03/25/20 8/10    Time 8    Period Weeks    Status New      PT LONG TERM GOAL #2   Title Pt will increase 10MWT by at least 0.34m/s in order to demonstrate safe limited community speed and speed of independence with iADLs    Baseline 03/25/20 with rollator 0.38m/s    Time 8    Period Weeks      PT LONG TERM GOAL #3   Title Pt will decrease 5TSTS by at least 3 seconds in order to demonstrate clinically significant improvement in LE strength    Baseline 03/25/20 21.3sec    Time 8    Period Weeks    Status On-going      PT LONG TERM GOAL #4   Title Patient will increase FOTO score to 52 to demonstrate predicted increase in functional mobility to complete ADLs    Baseline 03/28/20 37    Time 8    Period Weeks      PT LONG TERM GOAL #5   Title Pt will increase 6MWT by at least 38m (151ft) in order to demonstrate clinically significant improvement in cardiopulmonary endurance and community ambulation    Baseline 03/28/20 425ft    Time 8    Period Weeks    Status New                 Plan - 03/28/20 0936    Clinical Impression Statement PT led patient in therex progression for increased ext posture and strengthening with success. Patient is able to complete all therex with good motivation, and good carry over of multimodal cuing provided. )2 vitals monitored  throughout session, with  minimal adjusting gof O2 from 2L to 3L as needed to maintain O2 sats >98%. PT will continue progression as able.    Personal Factors and Comorbidities Age;Comorbidity 1;Comorbidity 2;Comorbidity 3+;Past/Current Experience;Sex;Fitness;Time since onset of injury/illness/exacerbation    Comorbidities HTN, osteoporosis, COPD    Examination-Activity Limitations Lift;Stand;Transfers;Squat;Stairs;Locomotion Level    Examination-Participation Restrictions Community Activity;Laundry;Cleaning    Stability/Clinical Decision Making Evolving/Moderate complexity    Clinical Decision Making Moderate    Rehab Potential Good    PT Frequency 2x / week    PT Duration 8 weeks    PT Treatment/Interventions Electrical Stimulation;Moist Heat;Traction;Ultrasound;Gait training;Therapeutic activities;DME Instruction;Neuromuscular re-education;Manual techniques;Spinal Manipulations;Joint Manipulations;Passive range of motion;Dry needling;Patient/family education;Balance training;Functional mobility training;Therapeutic exercise;Cryotherapy;ADLs/Self Care Home Management;Stair training;Energy conservation    PT Next Visit Plan increased ext strengthening/mobility, gait training    PT Home Exercise Plan lying prone, STS, modified thomas stretch    Consulted and Agree with Plan of Care Patient           Patient will benefit from skilled therapeutic intervention in order to improve the following deficits and impairments:  Cardiopulmonary status limiting activity,Decreased balance,Decreased coordination,Decreased mobility,Difficulty walking,Impaired flexibility,Impaired tone,Postural dysfunction,Abnormal gait,Decreased activity tolerance,Decreased endurance,Decreased range of motion,Decreased strength,Increased fascial restricitons,Hypomobility,Pain,Improper body mechanics  Visit Diagnosis: Chronic bilateral low back pain without sciatica  Other abnormalities of gait and mobility     Problem List Patient Active  Problem List   Diagnosis Date Noted  . Spondylosis without myelopathy or radiculopathy, lumbar region 11/06/2019  . 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 Chronic use of opiate for therapeutic purpose 12/14/2018  . Chronic pain syndrome 12/14/2018  . Thoracic radiculopathy 12/14/2018  . Acute on chronic respiratory failure with hypoxia ([place])   . Palliative care encounter   . Protein-calorie malnutrition, severe 02/06/2018  . Acute respiratory failure ([place]) 02/04/2018  . COPD (chronic obstructive pulmonary disease) ([place]) 03/22/2017  . Post herpetic neuralgia 07/30/2016  . Pneumonia 11/17/2015  . Long term current use of systemic steroids 02/14/2015  . Pulmonary hypertension ([place]) 10/05/2013  . Pulmonary sarcoidosis ([place]) 10/11/1999   Durwin Reges DPT Durwin Reges 03/28/2020, 9:58 AM  [place] [facility] [address] [place], [address] Phone: [phone]   Fax:  [phone]  Name: ALEXANDREA WESTERGARD MRN: [id number] Date of Birth: 03/14/1946

## 2020-04-01 ENCOUNTER — Other Ambulatory Visit: Payer: Self-pay

## 2020-04-01 ENCOUNTER — Ambulatory Visit
Payer: Medicare PPO | Attending: Student in an Organized Health Care Education/Training Program | Admitting: Student in an Organized Health Care Education/Training Program

## 2020-04-01 ENCOUNTER — Encounter: Payer: Self-pay | Admitting: Student in an Organized Health Care Education/Training Program

## 2020-04-01 DIAGNOSIS — M792 Neuralgia and neuritis, unspecified: Secondary | ICD-10-CM | POA: Insufficient documentation

## 2020-04-01 DIAGNOSIS — B0229 Other postherpetic nervous system involvement: Secondary | ICD-10-CM | POA: Diagnosis present

## 2020-04-01 DIAGNOSIS — G894 Chronic pain syndrome: Secondary | ICD-10-CM | POA: Diagnosis present

## 2020-04-01 MED ORDER — HYDROCODONE-ACETAMINOPHEN 10-325 MG PO TABS
1.0000 | ORAL_TABLET | Freq: Three times a day (TID) | ORAL | 0 refills | Status: AC | PRN
Start: 2020-04-01 — End: 2020-05-01

## 2020-04-01 MED ORDER — PREGABALIN 100 MG PO CAPS: 100.0000 mg | ORAL_CAPSULE | Freq: Two times a day (BID) | ORAL | 11 refills | Status: DC

## 2020-04-01 MED ORDER — MELOXICAM 7.5 MG PO TABS: 7.5000 mg | ORAL_TABLET | Freq: Every day | ORAL | 2 refills | Status: AC | PRN

## 2020-04-01 NOTE — Progress Notes (Signed)
PROVIDER NOTE: Information contained herein reflects review and annotations entered in association with encounter. Interpretation of such information and data should be left to medically-trained personnel. Information provided to patient can be located elsewhere in the medical record under "Patient Instructions". Document created using STT-dictation technology, any transcriptional errors that may result from process are unintentional.    Patient: Autumn [name]  Service Category: E/M  Provider: Gillis Santa, MD  DOB: [birth year]  DOS: 04/01/2020  Specialty: Interventional Pain Management  MRN: [id number]  Setting: Ambulatory outpatient  PCP: Baxter Hire, MD  Type: Established Patient    Referring Provider: Baxter Hire, MD  Location: Office  Delivery: Face-to-face     HPI  [name], a 74 y.o. year old female, is here today because of her No primary diagnosis found.. [name] primary complain today is Back Pain (low) and Abdominal Pain (Left upper) Last encounter: My last encounter with her was on 12/03/2019. Pertinent problems: [name] has COPD (chronic obstructive pulmonary disease) ([place]); Post herpetic neuralgia; Pulmonary sarcoidosis ([place]); Chronic pain syndrome; Thoracic radiculopathy; and Spondylosis without myelopathy or radiculopathy, lumbar region on their pertinent problem list. Pain Assessment: Severity of Chronic pain is reported as a 6 /10. Location: Abdomen Left,Upper/radiates around to back. Onset: More than a month ago. Quality: Burning,Sharp. Timing: Constant. Modifying factor(s): sitting, pain medicine. Vitals:  height is '5\' 8"'  (1.727 m) and weight is 135 lb (61.2 kg). Her temperature is 96.9 F (36.1 C) (abnormal). Her blood pressure is 141/59 (abnormal) and her pulse is 58 (abnormal). Her respiration is 18 and oxygen saturation is 100%.   Reason for encounter: medication management.   Patient's pain is at baseline.  Patient continues multimodal pain regimen  as prescribed.  States that it provides pain relief and improvement in functional status.  She had some issues with constipation saw Dr. Edwina Barth and was recommended to continue MiraLAX daily and stool softeners.  Patient takes hydrocodone seldomly and a maximum of 10 mg a day which I do not think is contributing to her constipation. Patient requesting trial of Mobic, counseled on the risk of gastritis and kidney disease with long-term use however okay to take as needed up to 3 times a week when she has severe pain.  Patient continues to work with physical therapy which I commended her on.  Pharmacotherapy Assessment   Analgesic: Hydrocodone 10 mg daily as needed, quantity 90 usually last 2 to 3 months  Monitoring: [place] PMP: PDMP reviewed during this encounter.       Pharmacotherapy: No side-effects or adverse reactions reported. Compliance: No problems identified. Effectiveness: Clinically acceptable.  [name], RN  04/01/2020 10:42 AM  Sign when Signing Visit Nursing Pain Medication Assessment:  Safety precautions to be maintained throughout the outpatient stay will include: orient to surroundings, keep bed in low position, maintain call bell within reach at all times, provide assistance with transfer out of bed and ambulation.  Medication Inspection Compliance: [name] did not comply with our request to bring her pills to be counted. She was reminded that bringing the medication bottles, even when empty, is a requirement.  Medication: None brought in. Pill/Patch Count: None available to be counted. Bottle Appearance: No container available. Did not bring bottle(s) to appointment. Filled Date: N/A Last Medication intake:  Yesterday    UDS:  Summary  Date Value Ref Range Status  03/22/2017 FINAL  Final    Comment:    ==================================================================== TOXAS[facility]RE COMP DRUG  ANALYSIS,UR ====================================================================  Test                             Result       Flag       Units Drug Present and Declared for Prescription Verification   Buprenorphine                  23           EXPECTED   ng/mg creat   Norbuprenorphine               112          EXPECTED   ng/mg creat    Source of buprenorphine is a scheduled prescription medication.    Norbuprenorphine is an expected metabolite of buprenorphine.   Gabapentin                     PRESENT      EXPECTED   Sertraline                     PRESENT      EXPECTED   Desmethylsertraline            PRESENT      EXPECTED    Desmethylsertraline is an expected metabolite of sertraline.   Atenolol                       PRESENT      EXPECTED Drug Absent but Declared for Prescription Verification   Hydrocodone                    Not Detected UNEXPECTED ng/mg creat   Tramadol                       Not Detected UNEXPECTED ng/mg creat   Acetaminophen                  Not Detected UNEXPECTED    Acetaminophen, as indicated in the declared medication list, is    not always detected even when used as directed.   Ibuprofen                      Not Detected UNEXPECTED    Ibuprofen, as indicated in the declared medication list, is not    always detected even when used as directed.   Diphenhydramine                Not Detected UNEXPECTED ==================================================================== Test                      Result    Flag   Units      Ref Range   Creatinine              182              mg/dL      >=20 ==================================================================== Declared Medications:  The flagging and interpretation on this report are based on the  following declared medications.  Unexpected results may arise from  inaccuracies in the declared medications.  **Note: The testing scope of this panel includes these medications:  Atenolol  Buprenorphine   Diphenhydramine  Gabapentin  Hydrocodone (Hydrocodone-Acetaminophen)  Sertraline  Tramadol  **Note: The testing scope of this panel does not include small to  moderate amounts of these reported medications:  Acetaminophen (Hydrocodone-Acetaminophen)  Ibuprofen  **Note: The testing scope of this panel does not include following  reported medications:  Albuterol  Amoxicillin (Augmentin)  Atorvastatin  Azithromycin  Brimonidine Tartrate  Brinzolamide  Calcium (Calcium/Vitamin D3)  Clavulinate (Augmentin)  Fluticasone (Advair)  Latanoprost  Omega-3 Fatty Acids  Ondansetron  Prednisone  Salmeterol (Advair)  Tiotropium  Topical  Vitamin D3 (Calcium/Vitamin D3) ==================================================================== For clinical consultation, please call [phone]. ====================================================================      ROS  Constitutional: Denies any fever or chills Gastrointestinal: No reported hemesis, hematochezia, vomiting, or acute GI distress Musculoskeletal: Midthoracic pain Neurological: No reported episodes of acute onset apraxia, aphasia, dysarthria, agnosia, amnesia, paralysis, loss of coordination, or loss of consciousness  Medication Review  Calcium Carbonate-Vitamin D, Fish Oil, Fluticasone-Umeclidin-Vilant, HYDROcodone-acetaminophen, Propylene Glycol, acetaminophen, albuterol, atenolol, atorvastatin, cyanocobalamin, denosumab, fluticasone, ipratropium-albuterol, meloxicam, montelukast, predniSONE, pregabalin, psyllium, and sertraline  History Review  Allergy: Ms. Lezotte is allergic to ivp dye [iodinated diagnostic agents]. Drug: Ms. Tyree  reports no history of drug use. Alcohol:  reports no history of alcohol use. Tobacco:  reports that she has never smoked. She has never used smokeless tobacco. Social: Ms. Ferraiolo  reports that she has never smoked. She has never used smokeless tobacco. She reports that she does not drink  alcohol and does not use drugs. Medical:  has a past medical history of Allergy, Anemia, Arthritis, COPD (chronic obstructive pulmonary disease) ([place]), Glaucoma, H/O uvulectomy, Hypertension, Osteoporosis, Ovarian cancer ([place]), Postherpetic neuralgia, Pulmonary sarcoidosis ([place]), and Sarcoid. Surgical: Ms. Lyttle  has a past surgical history that includes Abdominal hysterectomy; Dilation and curettage of uterus; Vulvar lesion removal; Diagnostic laparoscopy; Eye surgery; Colonoscopy with propofol (N/A, 07/30/2019); and Breast excisional biopsy ([place]). Family: family history includes Breast cancer (age of onset: 58) in her mother; Cancer in her mother; Hypertension in her father.  Laboratory Chemistry Profile   Renal Lab Results  Component Value Date   BUN 28 (H) 02/13/2018   CREATININE 0.68 02/13/2018   GFRAA >60 02/13/2018   GFRNONAA >60 02/13/2018     Hepatic Lab Results  Component Value Date   AST 21 03/29/2015   ALT 20 03/29/2015   ALBUMIN 4.4 03/29/2015   ALKPHOS 46 03/29/2015   LIPASE 116 12/15/2013     Electrolytes Lab Results  Component Value Date   NA 145 02/13/2018   K 4.6 02/13/2018   CL 100 02/13/2018   CALCIUM 10.0 02/13/2018   MG 2.7 (H) 02/07/2018   PHOS 3.2 02/07/2018     Bone No results found for: VD25OH, VD125OH2TOT, ZY2482NO0, BB0488QB1, 25OHVITD1, 25OHVITD2, 25OHVITD3, TESTOFREE, TESTOSTERONE   Inflammation (CRP: Acute Phase) (ESR: Chronic Phase) Lab Results  Component Value Date   ESRSEDRATE 48 (H) 05/25/2011       Note: Above Lab results reviewed.  Recent Imaging Review  DG PAIN CLINIC C-ARM 1-60 MIN NO REPORT Fluoro was used, but no Radiologist interpretation will be provided.  Please refer to "NOTES" tab for provider progress note. Note: Reviewed        Physical Exam  General appearance: Well nourished, well developed, and well hydrated. In no apparent acute distress Mental status: Alert, oriented x 3 (person, place, & time)        Respiratory: No evidence of acute respiratory distress Eyes: PERLA Vitals: BP (!) 141/59   Pulse (!) 58   Temp (!) 96.9 F (36.1 C)   Resp 18   Ht '5\' 8"'  (1.727 m)   Wt 135 lb (61.2 kg)   SpO2 100%   BMI 20.53  kg/m  BMI: Estimated body mass index is 20.53 kg/m as calculated from the following:   Height as of this encounter: '5\' 8"'  (1.727 m).   Weight as of this encounter: 135 lb (61.2 kg). Ideal: Ideal body weight: 63.9 kg (140 lb 14 oz)  Lumbar Spine Area Exam  Skin & Axial Inspection: No masses, redness, or swelling Alignment: Levoscoliosis Functional ROM: Pain restricted ROM affecting primarily the left Stability: No instability detected Muscle Tone/Strength: Functionally intact. No obvious neuro-muscular anomalies detected. Sensory (Neurological): Articular pain pattern Palpation: No palpable anomalies       Provocative Tests: Hyperextension/rotation test: Positive bilaterally for facet joint pain.,  Pain with facet loading Lumbar quadrant test (Kemp's test): (+) bilaterally for facet joint pain. Lateral bending test: (+) due to pain. Patrick's Maneuver: deferred today                   FABER* test: deferred today                   S-I anterior distraction/compression test: deferred today         S-I lateral compression test: deferred today         S-I Thigh-thrust test: deferred today         S-I Gaenslen's test: deferred today         *(Flexion, ABduction and External Rotation) Gait & Posture Assessment  Ambulation: Limited Gait: Limited. Using assistive device to ambulate Posture: Difficulty standing up straight, due to pain  Lower Extremity Exam    Side: Right lower extremity  Side: Left lower extremity  Stability: No instability observed          Stability: No instability observed          Skin & Extremity Inspection: Skin color, temperature, and hair growth are WNL. No peripheral edema or cyanosis. No masses, redness, swelling, asymmetry, or associated skin  lesions. No contractures.  Skin & Extremity Inspection: Skin color, temperature, and hair growth are WNL. No peripheral edema or cyanosis. No masses, redness, swelling, asymmetry, or associated skin lesions. No contractures.  Functional ROM: Pain restricted ROM for all joints of the lower extremity          Functional ROM: Pain restricted ROM for all joints of the lower extremity          Muscle Tone/Strength: Functionally intact. No obvious neuro-muscular anomalies detected.  Muscle Tone/Strength: Functionally intact. No obvious neuro-muscular anomalies detected.  Sensory (Neurological): Unimpaired        Sensory (Neurological): Unimpaired        DTR: Patellar: deferred today Achilles: deferred today Plantar: deferred today  DTR: Patellar: deferred today Achilles: deferred today Plantar: deferred today  Palpation: No palpable anomalies  Palpation: No palpable anomalies     Assessment   Status Diagnosis  Persistent Persistent Persistent 1. Chronic pain syndrome   2. Neuropathic pain   3. Postinfective intercostal neuralgia   4. Post herpetic neuralgia        Plan of Care   Ms. MELIKA REDER has a current medication list which includes the following long-term medication(s): atenolol, atorvastatin, calcium 600+d3, fluticasone, ipratropium-albuterol, montelukast, pregabalin, and sertraline.  Pharmacotherapy (Medications Ordered): Meds ordered this encounter  Medications  . HYDROcodone-acetaminophen (NORCO) 10-325 MG tablet    Sig: Take 1 tablet by mouth every 8 (eight) hours as needed.    Dispense:  90 tablet    Refill:  0    For chronic pain syndrome  . pregabalin (  LYRICA) 100 MG capsule    Sig: Take 1 capsule (100 mg total) by mouth 2 (two) times daily.    Dispense:  60 capsule    Refill:  11    Fill one day early if pharmacy is closed on scheduled refill date. May substitute for generic if available.  . meloxicam (MOBIC) 7.5 MG tablet    Sig: Take 1 tablet (7.5  mg total) by mouth daily as needed for pain.    Dispense:  30 tablet    Refill:  2  Recommend patient take daily stool softener and if she goes more than 2 days without a bowel movement to supplement with MiraLAX.  Orders:  Orders Placed This Encounter  Procedures  . ToxASSURE Select 13 (MW), Urine    Volume: 30 ml(s). Minimum 3 ml of urine is needed. Document temperature of fresh sample. Indications: Long term (current) use of opiate analgesic [phone])    Order Specific Question:   Release to patient    Answer:   Immediate   Follow-up plan:   Return in about 3 months (around 06/30/2020) for Medication Management, in person.   Recent Visits Date Type Provider Dept  01/02/20 Telemedicine Gillis Santa, MD [facility]  Showing recent visits within past 90 days and meeting all other requirements Today's Visits Date Type Provider Dept  04/01/20 Office Visit Gillis Santa, MD [facility]  Showing today's visits and meeting all other requirements Future Appointments Date Type Provider Dept  06/26/20 Appointment Gillis Santa, MD [facility]  Showing future appointments within next 90 days and meeting all other requirements  I discussed the assessment and treatment plan with the patient. The patient was provided an opportunity to ask questions and all were answered. The patient agreed with the plan and demonstrated an understanding of the instructions.  Patient advised to call back or seek an in-person evaluation if the symptoms or condition worsens.  Duration of encounter: 30 minutes.  Note by: Gillis Santa, MD Date: 04/01/2020; Time: 12:26 PM

## 2020-04-01 NOTE — Progress Notes (Signed)
Nursing Pain Medication Assessment:  Safety precautions to be maintained throughout the outpatient stay will include: orient to surroundings, keep bed in low position, maintain call bell within reach at all times, provide assistance with transfer out of bed and ambulation.  Medication Inspection Compliance: Autumn [name] did not comply with our request to bring her pills to be counted. She was reminded that bringing the medication bottles, even when empty, is a requirement.  Medication: None brought in. Pill/Patch Count: None available to be counted. Bottle Appearance: No container available. Did not bring bottle(s) to appointment. Filled Date: N/A Last Medication intake:  Yesterday

## 2020-04-03 ENCOUNTER — Encounter: Payer: Self-pay | Admitting: Physical Therapy

## 2020-04-03 ENCOUNTER — Ambulatory Visit: Payer: Medicare PPO | Admitting: Physical Therapy

## 2020-04-03 ENCOUNTER — Other Ambulatory Visit: Payer: Self-pay

## 2020-04-03 DIAGNOSIS — G8929 Other chronic pain: Secondary | ICD-10-CM

## 2020-04-03 DIAGNOSIS — R2689 Other abnormalities of gait and mobility: Secondary | ICD-10-CM

## 2020-04-03 DIAGNOSIS — M545 Low back pain, unspecified: Secondary | ICD-10-CM | POA: Diagnosis not present

## 2020-04-03 NOTE — Therapy (Signed)
[facility] [address] [place], [address] Phone: [phone]   Fax:  [phone]  Physical Therapy Treatment  Patient Details  Name: Autumn [name] MRN: [id number] Date of Birth: 1945/08/31 No data recorded  Encounter Date: 04/03/2020   PT End of Session - 04/03/20 1038    Visit Number 3    Number of Visits 17    Date for PT Re-Evaluation 05/16/20    Authorization - Visit Number 3    Authorization - Number of Visits 10    PT Start Time 1030    PT Stop Time 1110    PT Time Calculation (min) 40 min    Equipment Utilized During Treatment Gait belt    Activity Tolerance Patient tolerated treatment well    Behavior During Therapy WFL for tasks assessed/performed           Past Medical History:  Diagnosis Date  . Allergy   . Anemia   . Arthritis   . COPD (chronic obstructive pulmonary disease) (HCC)   . Glaucoma   . H/O uvulectomy   . Hypertension   . Osteoporosis   . Ovarian cancer (HCC)   . Postherpetic neuralgia   . Pulmonary sarcoidosis (HCC)   . Sarcoid     Past Surgical History:  Procedure Laterality Date  . ABDOMINAL HYSTERECTOMY    . BREAST EXCISIONAL BIOPSY Right    benign  . COLONOSCOPY WITH PROPOFOL N/A 07/30/2019   Procedure: COLONOSCOPY WITH PROPOFOL;  Surgeon: Toledo, Boykin Nearing, MD;  Location: [facility] ENDOSCOPY;  Service: Gastroenterology;  Laterality: N/A;  . DIAGNOSTIC LAPAROSCOPY    . DILATION AND CURETTAGE OF UTERUS    . EYE SURGERY    . VULVAR LESION REMOVAL      There were no vitals filed for this visit.   Subjective Assessment - 04/03/20 1037    Subjective Reports she is trying to complete her HEP more.Felt good following last session. No pain other than her "nerve pain from shingles recovery this am.    Pertinent History Pt is a 74 year old female presenting with acute on chronic LBP with insideous onset a year ago. Reports she finds it difficult to walk upright d/t  back pain, and can only walk . Pain is increased by all movements but she especially notices it when she is cooking and completing household tasks. Patient cannot see out of her R eye and has glaucoma in the L so her husband drives her to appointments and to church on Sundays. She has ramp at church, and only 2 steps to enter/exit her one story home that she is able to do without assistance with rail. Pt uses rollator in the community and occassional SPC in home. One chronic 2L O2 that she increases to 3 if she feels short of breath. Denies any falls in the past 6 months. LBP is across low back, denies radiating symptoms or numbness/tingling. Reports pain is 8/10 at its worst, and 0/10 at it's best when she has her pain medication. Currently 7/10. Pt denies N/V, B&B changes, unexplained weight fluctuation, saddle paresthesia, fever, night sweats, or unrelenting night pain at this time.    Limitations Walking    How long can you sit comfortably? unlimited "feels good"    How long can you stand comfortably?    How long can you walk comfortably?    Patient Stated Goals stand up tall and walk faster, without walker  Pain Onset More than a month ago              Ther-Ex Nustep seat 11 BUE 12 L2 24mins L3 69mins with min cuing to keep SPM over 60; O2 monitored Sit to Stand From elevated mat table 3x 10 with min cuing for full availible hip ext with good carry over Alt stepping over 6in hurdle 3x 10 with standing rest breaks between sets Alt step up and down from 3in step 3x 10 with standing rest between sets cuing for posture Standing heel raises 2x 10 with min cuing for                         PT Education - 04/03/20 1038    Education Details therex form/technique    Person(s) Educated Patient    Methods Explanation;Demonstration;Verbal cues    Comprehension Verbalized understanding;Returned demonstration;Verbal cues required            PT Short Term  Goals - 03/25/20 1410      PT SHORT TERM GOAL #1   Title Pt will be independent with HEP in order to improve strength and decrease back pain in order to improve pain-free function at home and work.    Baseline 03/25/20 HEP given    Time 4    Period Weeks    Status New             PT Long Term Goals - 03/28/20 0930      PT LONG TERM GOAL #1   Title Pt will decrease worst back pain as reported on NPRS by at least 2 points in order to demonstrate clinically significant reduction in back pain.    Baseline 03/25/20 8/10    Time 8    Period Weeks    Status New      PT LONG TERM GOAL #2   Title Pt will increase 10MWT by at least 0.69m/s in order to demonstrate safe limited community speed and speed of independence with iADLs    Baseline 03/25/20 with rollator 0.19m/s    Time 8    Period Weeks      PT LONG TERM GOAL #3   Title Pt will decrease 5TSTS by at least 3 seconds in order to demonstrate clinically significant improvement in LE strength    Baseline 03/25/20 21.3sec    Time 8    Period Weeks    Status On-going      PT LONG TERM GOAL #4   Title Patient will increase FOTO score to 52 to demonstrate predicted increase in functional mobility to complete ADLs    Baseline 03/28/20 37    Time 8    Period Weeks      PT LONG TERM GOAL #5   Title Pt will increase 6MWT by at least 8m (173ft) in order to demonstrate clinically significant improvement in cardiopulmonary endurance and community ambulation    Baseline 03/28/20 474ft    Time 8    Period Weeks    Status New                 Plan - 04/03/20 1116    Clinical Impression Statement PT continued therex progression for increased standing endurance, and strengthening with success. O2 monitored throughout session with breaks accordingly. Supervision for most therex for safety. Patient is able to comply with all cuing for proper technique of therex with good motivation throughout session. PT will continue progression as  able.  Personal Factors and Comorbidities Age;Comorbidity 1;Comorbidity 2;Comorbidity 3+;Past/Current Experience;Sex;Fitness;Time since onset of injury/illness/exacerbation    Comorbidities HTN, osteoporosis, COPD    Examination-Activity Limitations Lift;Stand;Transfers;Squat;Stairs;Locomotion Level    Examination-Participation Restrictions Community Activity;Laundry;Cleaning    Stability/Clinical Decision Making Evolving/Moderate complexity    Clinical Decision Making Moderate    Rehab Potential Good    PT Frequency 2x / week    PT Duration 8 weeks    PT Treatment/Interventions Electrical Stimulation;Moist Heat;Traction;Ultrasound;Gait training;Therapeutic activities;DME Instruction;Neuromuscular re-education;Manual techniques;Spinal Manipulations;Joint Manipulations;Passive range of motion;Dry needling;Patient/family education;Balance training;Functional mobility training;Therapeutic exercise;Cryotherapy;ADLs/Self Care Home Management;Stair training;Energy conservation    PT Next Visit Plan increased ext strengthening/mobility, gait training    PT Home Exercise Plan lying prone, STS, modified thomas stretch    Consulted and Agree with Plan of Care Patient           Patient will benefit from skilled therapeutic intervention in order to improve the following deficits and impairments:  Cardiopulmonary status limiting activity,Decreased balance,Decreased coordination,Decreased mobility,Difficulty walking,Impaired flexibility,Impaired tone,Postural dysfunction,Abnormal gait,Decreased activity tolerance,Decreased endurance,Decreased range of motion,Decreased strength,Increased fascial restricitons,Hypomobility,Pain,Improper body mechanics  Visit Diagnosis: Chronic bilateral low back pain without sciatica  Other abnormalities of gait and mobility     Problem List Patient Active Problem List   Diagnosis Date Noted  . Spondylosis without myelopathy or radiculopathy, lumbar region  11/06/2019  . 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 Chronic use of opiate for therapeutic purpose 12/14/2018  . Chronic pain syndrome 12/14/2018  . Thoracic radiculopathy 12/14/2018  . Acute on chronic respiratory failure with hypoxia ([place])   . Palliative care encounter   . Protein-calorie malnutrition, severe 02/06/2018  . Acute respiratory failure ([place]) 02/04/2018  . COPD (chronic obstructive pulmonary disease) ([place]) 03/22/2017  . Post herpetic neuralgia 07/30/2016  . Pneumonia 11/17/2015  . Long term current use of systemic steroids 02/14/2015  . Pulmonary hypertension ([place]) 10/05/2013  . Pulmonary sarcoidosis ([place]) 10/11/1999   Durwin Reges DPT Durwin Reges 04/03/2020, 11:23 AM  [place] [facility] [address] [place], [address] Phone: [phone]   Fax:  [phone]  Name: NEVAYAH FERSCH MRN: [id number] Date of Birth: [birth year]

## 2020-04-07 ENCOUNTER — Ambulatory Visit: Payer: Medicare PPO | Admitting: Physical Therapy

## 2020-04-07 ENCOUNTER — Encounter: Payer: Self-pay | Admitting: Physical Therapy

## 2020-04-07 ENCOUNTER — Other Ambulatory Visit: Payer: Self-pay

## 2020-04-07 DIAGNOSIS — G8929 Other chronic pain: Secondary | ICD-10-CM

## 2020-04-07 DIAGNOSIS — M545 Low back pain, unspecified: Secondary | ICD-10-CM

## 2020-04-07 DIAGNOSIS — R2689 Other abnormalities of gait and mobility: Secondary | ICD-10-CM

## 2020-04-07 NOTE — Therapy (Signed)
[facility] [address] [place], [address] Phone: [phone]   Fax:  [phone]  Physical Therapy Treatment  Patient Details  Name: Autumn [name] MRN: [id number] Date of Birth: [birth year] No data recorded  Encounter Date: 04/07/2020   PT End of Session - 04/07/20 1044    Visit Number 4    Number of Visits 17    Date for PT Re-Evaluation 05/16/20    Authorization - Visit Number 4    Authorization - Number of Visits 10    PT Start Time 1030    PT Stop Time 1110    PT Time Calculation (min) 40 min    Equipment Utilized During Treatment Gait belt    Activity Tolerance Patient tolerated treatment well    Behavior During Therapy WFL for tasks assessed/performed           Past Medical History:  Diagnosis Date  . Allergy   . Anemia   . Arthritis   . COPD (chronic obstructive pulmonary disease) (HCC)   . Glaucoma   . H/O uvulectomy   . Hypertension   . Osteoporosis   . Ovarian cancer (HCC)   . Postherpetic neuralgia   . Pulmonary sarcoidosis (HCC)   . Sarcoid     Past Surgical History:  Procedure Laterality Date  . ABDOMINAL HYSTERECTOMY    . BREAST EXCISIONAL BIOPSY Right    benign  . COLONOSCOPY WITH PROPOFOL N/A 07/30/2019   Procedure: COLONOSCOPY WITH PROPOFOL;  Surgeon: Toledo, Boykin Nearing, MD;  Location: [facility] ENDOSCOPY;  Service: Gastroenterology;  Laterality: N/A;  . DIAGNOSTIC LAPAROSCOPY    . DILATION AND CURETTAGE OF UTERUS    . EYE SURGERY    . VULVAR LESION REMOVAL      There were no vitals filed for this visit.   Subjective Assessment - 04/07/20 1037    Subjective Reports min compliance with HEP d/t holiday. Reports she was cooking a lot so she had increased LBP, up to 7/10, but is doing better today.    Pertinent History Pt is a 74 year old female presenting with acute on chronic LBP with insideous onset a year ago. Reports she finds it difficult to walk upright d/t back  pain, and can only walk . Pain is increased by all movements but she especially notices it when she is cooking and completing household tasks. Patient cannot see out of her R eye and has glaucoma in the L so her husband drives her to appointments and to church on Sundays. She has ramp at church, and only 2 steps to enter/exit her one story home that she is able to do without assistance with rail. Pt uses rollator in the community and occassional SPC in home. One chronic 2L O2 that she increases to 3 if she feels short of breath. Denies any falls in the past 6 months. LBP is across low back, denies radiating symptoms or numbness/tingling. Reports pain is 8/10 at its worst, and 0/10 at it's best when she has her pain medication. Currently 7/10. Pt denies N/V, B&B changes, unexplained weight fluctuation, saddle paresthesia, fever, night sweats, or unrelenting night pain at this time.    Limitations Walking    How long can you sit comfortably? unlimited "feels good"    How long can you stand comfortably?    How long can you walk comfortably?    Patient Stated Goals stand up tall and walk faster, without walker  Pain Onset More than a month ago              Ther-Ex Nustep seat 11 BUE 12 L3 65mins with min cuing to keep SPM over 60; O2 monitored Sit to StandFrom elevated mat table with yellow tball overhead lift with stand 3x 10 with min cuing to establish standing balance with UEs overhead prior to sit.  Standing alt toe taps on 6in cone x12; w/ 2 cones stacked on each other x12 with unilateral UE support; supervision for safety; standing break between sets Standing heel raises 2x 10 with min cuing to prevent forward rock with good carry over   Gait Training  343ft w/ speed changes called out at random with CGA for safety Adjustment made to rollator (handles were placing elbow near 80d) 167ft following adjustment and education on walking posture with rollator with good carry  over                      PT Education - 04/07/20 1044    Education Details therex form/technique    Person(s) Educated Patient    Methods Explanation;Demonstration;Verbal cues    Comprehension Verbalized understanding;Returned demonstration;Verbal cues required            PT Short Term Goals - 03/25/20 1410      PT SHORT TERM GOAL #1   Title Pt will be independent with HEP in order to improve strength and decrease back pain in order to improve pain-free function at home and work.    Baseline 03/25/20 HEP given    Time 4    Period Weeks    Status New             PT Long Term Goals - 03/28/20 0930      PT LONG TERM GOAL #1   Title Pt will decrease worst back pain as reported on NPRS by at least 2 points in order to demonstrate clinically significant reduction in back pain.    Baseline 03/25/20 8/10    Time 8    Period Weeks    Status New      PT LONG TERM GOAL #2   Title Pt will increase 10MWT by at least 0.46m/s in order to demonstrate safe limited community speed and speed of independence with iADLs    Baseline 03/25/20 with rollator 0.52m/s    Time 8    Period Weeks      PT LONG TERM GOAL #3   Title Pt will decrease 5TSTS by at least 3 seconds in order to demonstrate clinically significant improvement in LE strength    Baseline 03/25/20 21.3sec    Time 8    Period Weeks    Status On-going      PT LONG TERM GOAL #4   Title Patient will increase FOTO score to 52 to demonstrate predicted increase in functional mobility to complete ADLs    Baseline 03/28/20 37    Time 8    Period Weeks      PT LONG TERM GOAL #5   Title Pt will increase 6MWT by at least 57m (19ft) in order to demonstrate clinically significant improvement in cardiopulmonary endurance and community ambulation    Baseline 03/28/20 430ft    Time 8    Period Weeks    Status New                 Plan - 04/07/20 1053    Clinical Impression Statement PT continued therex  progression for increased strength and dynamic balance needed for community ambulation and ind transfers with success. Pt requires at least supervision gaurding through most of session for safety. Pt is motivated throughout session and is able to comply with all cuing for proper technique of therex. PT will continue progression as able.    Personal Factors and Comorbidities Age;Comorbidity 1;Comorbidity 2;Comorbidity 3+;Past/Current Experience;Sex;Fitness;Time since onset of injury/illness/exacerbation    Comorbidities HTN, osteoporosis, COPD    Examination-Activity Limitations Lift;Stand;Transfers;Squat;Stairs;Locomotion Level    Examination-Participation Restrictions Community Activity;Laundry;Cleaning    Stability/Clinical Decision Making Evolving/Moderate complexity    Clinical Decision Making Moderate    Rehab Potential Good    PT Frequency 2x / week    PT Duration 8 weeks    PT Treatment/Interventions Electrical Stimulation;Moist Heat;Traction;Ultrasound;Gait training;Therapeutic activities;DME Instruction;Neuromuscular re-education;Manual techniques;Spinal Manipulations;Joint Manipulations;Passive range of motion;Dry needling;Patient/family education;Balance training;Functional mobility training;Therapeutic exercise;Cryotherapy;ADLs/Self Care Home Management;Stair training;Energy conservation    PT Next Visit Plan increased ext strengthening/mobility, gait training    PT Home Exercise Plan lying prone, STS, modified thomas stretch    Consulted and Agree with Plan of Care Patient           Patient will benefit from skilled therapeutic intervention in order to improve the following deficits and impairments:  Cardiopulmonary status limiting activity,Decreased balance,Decreased coordination,Decreased mobility,Difficulty walking,Impaired flexibility,Impaired tone,Postural dysfunction,Abnormal gait,Decreased activity tolerance,Decreased endurance,Decreased range of motion,Decreased  strength,Increased fascial restricitons,Hypomobility,Pain,Improper body mechanics  Visit Diagnosis: Chronic bilateral low back pain without sciatica  Other abnormalities of gait and mobility     Problem List Patient Active Problem List   Diagnosis Date Noted  . Spondylosis without myelopathy or radiculopathy, lumbar region 11/06/2019  . 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 Chronic use of opiate for therapeutic purpose 12/14/2018  . Chronic pain syndrome 12/14/2018  . Thoracic radiculopathy 12/14/2018  . Acute on chronic respiratory failure with hypoxia ([place])   . Palliative care encounter   . Protein-calorie malnutrition, severe 02/06/2018  . Acute respiratory failure ([place]) 02/04/2018  . COPD (chronic obstructive pulmonary disease) ([place]) 03/22/2017  . Post herpetic neuralgia 07/30/2016  . Pneumonia 11/17/2015  . Long term current use of systemic steroids 02/14/2015  . Pulmonary hypertension ([place]) 10/05/2013  . Pulmonary sarcoidosis ([place]) 10/11/1999   Durwin Reges DPT Durwin Reges 04/07/2020, 12:36 PM  [facility] [address] [place], [address] Phone: [phone]   Fax:  [phone]  Name: Autumn [name] MRN: [id number] Date of Birth: May 02, 1945

## 2020-04-09 ENCOUNTER — Ambulatory Visit: Payer: Medicare PPO | Admitting: Physical Therapy

## 2020-04-09 LAB — TOXASSURE SELECT 13 (MW), URINE

## 2020-04-10 ENCOUNTER — Ambulatory Visit: Payer: Medicare PPO | Admitting: Physical Therapy

## 2020-04-10 ENCOUNTER — Other Ambulatory Visit: Payer: Self-pay

## 2020-04-10 ENCOUNTER — Encounter: Payer: Self-pay | Admitting: Physical Therapy

## 2020-04-10 DIAGNOSIS — G8929 Other chronic pain: Secondary | ICD-10-CM

## 2020-04-10 DIAGNOSIS — R2689 Other abnormalities of gait and mobility: Secondary | ICD-10-CM

## 2020-04-10 DIAGNOSIS — M545 Low back pain, unspecified: Secondary | ICD-10-CM

## 2020-04-10 NOTE — Therapy (Signed)
[facility] [address], [place], [address] Phone: [phone]   Fax:  [phone]  Physical Therapy Treatment  Patient Details  Name: Autumn [name] MRN: [id number] Date of Birth: March 13, 1946 No data recorded  Encounter Date: 04/10/2020   PT End of Session - 04/10/20 1021    Visit Number 5    Number of Visits 17    Date for PT Re-Evaluation 05/16/20    Authorization - Visit Number 5    Authorization - Number of Visits 10    PT Start Time 0945    PT Stop Time 1025    PT Time Calculation (min) 40 min    Equipment Utilized During Treatment Gait belt    Activity Tolerance Patient tolerated treatment well    Behavior During Therapy WFL for tasks assessed/performed           Past Medical History:  Diagnosis Date  . Allergy   . Anemia   . Arthritis   . COPD (chronic obstructive pulmonary disease) (HCC)   . Glaucoma   . H/O uvulectomy   . Hypertension   . Osteoporosis   . Ovarian cancer (HCC)   . Postherpetic neuralgia   . Pulmonary sarcoidosis (HCC)   . Sarcoid     Past Surgical History:  Procedure Laterality Date  . ABDOMINAL HYSTERECTOMY    . BREAST EXCISIONAL BIOPSY Right    benign  . COLONOSCOPY WITH PROPOFOL N/A 07/30/2019   Procedure: COLONOSCOPY WITH PROPOFOL;  Surgeon: Toledo, Boykin Nearing, MD;  Location: [facility] ENDOSCOPY;  Service: Gastroenterology;  Laterality: N/A;  . DIAGNOSTIC LAPAROSCOPY    . DILATION AND CURETTAGE OF UTERUS    . EYE SURGERY    . VULVAR LESION REMOVAL      There were no vitals filed for this visit.   Subjective Assessment - 04/10/20 1019    Subjective Reports she is doing okay, but her oxygen at home has not been working correctly, making her a little tired today.    Pertinent History Pt is a 74 year old female presenting with acute on chronic LBP with insideous onset a year ago. Reports she finds it difficult to walk upright d/t back pain, and can only walk  . Pain is increased by all movements but she especially notices it when she is cooking and completing household tasks. Patient cannot see out of her R eye and has glaucoma in the L so her husband drives her to appointments and to church on Sundays. She has ramp at church, and only 2 steps to enter/exit her one story home that she is able to do without assistance with rail. Pt uses rollator in the community and occassional SPC in home. One chronic 2L O2 that she increases to 3 if she feels short of breath. Denies any falls in the past 6 months. LBP is across low back, denies radiating symptoms or numbness/tingling. Reports pain is 8/10 at its worst, and 0/10 at it's best when she has her pain medication. Currently 7/10. Pt denies N/V, B&B changes, unexplained weight fluctuation, saddle paresthesia, fever, night sweats, or unrelenting night pain at this time.    Limitations Walking    How long can you sit comfortably? unlimited "feels good"    How long can you stand comfortably?    How long can you walk comfortably?    Patient Stated Goals stand up tall and walk faster, without walker    Pain Onset More  than a month ago              Ther-Ex Nustep seat 11 BUE 12 L3 30mins with min cuing to keep SPM over 60; O2 monitored Sit to StandFrom elevated mat table with yellow tball overhead lift with stand 3x 10 with min cuing to establish standing balance with UEs overead prior to sit.  Standing alt step over 6in hurdle BUE support x12 with cuing for upright posture with decent carry over Standing heel raises 2x 12  Gait Training  115ft with cuing for rollator use with good carry over 183ft with cognitive task (counting backward from 100 by 2) with some increased sway and speed variances with addition of this 258ft w/ speed changes called out at random with CGA for safety amb to car 142ft with min cuing to "stay close to Rollator" with decent carry over Adjustment made to rollator  (handles were placing elbow near 80d) 176ft following adjustment and education on walking posture with rollator with good carry over                         PT Education - 04/10/20 1020    Education Details therex form/technique    Person(s) Educated Patient    Methods Explanation;Demonstration;Tactile cues    Comprehension Verbalized understanding;Returned demonstration;Verbal cues required;Tactile cues required            PT Short Term Goals - 03/25/20 1410      PT SHORT TERM GOAL #1   Title Pt will be independent with HEP in order to improve strength and decrease back pain in order to improve pain-free function at home and work.    Baseline 03/25/20 HEP given    Time 4    Period Weeks    Status New             PT Long Term Goals - 03/28/20 0930      PT LONG TERM GOAL #1   Title Pt will decrease worst back pain as reported on NPRS by at least 2 points in order to demonstrate clinically significant reduction in back pain.    Baseline 03/25/20 8/10    Time 8    Period Weeks    Status New      PT LONG TERM GOAL #2   Title Pt will increase 10MWT by at least 0.9m/s in order to demonstrate safe limited community speed and speed of independence with iADLs    Baseline 03/25/20 with rollator 0.72m/s    Time 8    Period Weeks      PT LONG TERM GOAL #3   Title Pt will decrease 5TSTS by at least 3 seconds in order to demonstrate clinically significant improvement in LE strength    Baseline 03/25/20 21.3sec    Time 8    Period Weeks    Status On-going      PT LONG TERM GOAL #4   Title Patient will increase FOTO score to 52 to demonstrate predicted increase in functional mobility to complete ADLs    Baseline 03/28/20 37    Time 8    Period Weeks      PT LONG TERM GOAL #5   Title Pt will increase 6MWT by at least 35m (156ft) in order to demonstrate clinically significant improvement in cardiopulmonary endurance and community ambulation    Baseline  03/28/20 438ft    Time 8    Period Weeks    Status New  Plan - 04/10/20 1037    Clinical Impression Statement PT continued progression for dynamic balance and safe community ambulation gait and BLE strengthening with success. Pt mildly limited by fatigue this session d/t difficulty with O2 this am. Vitals monitored throughout session, WNL. PT will continue progression as able.    Personal Factors and Comorbidities Age;Comorbidity 1;Comorbidity 2;Comorbidity 3+;Past/Current Experience;Sex;Fitness;Time since onset of injury/illness/exacerbation    Comorbidities HTN, osteoporosis, COPD    Examination-Activity Limitations Lift;Stand;Transfers;Squat;Stairs;Locomotion Level    Examination-Participation Restrictions Community Activity;Laundry;Cleaning    Stability/Clinical Decision Making Evolving/Moderate complexity    Clinical Decision Making Moderate    Rehab Potential Good    PT Frequency 2x / week    PT Duration 8 weeks    PT Treatment/Interventions Electrical Stimulation;Moist Heat;Traction;Ultrasound;Gait training;Therapeutic activities;DME Instruction;Neuromuscular re-education;Manual techniques;Spinal Manipulations;Joint Manipulations;Passive range of motion;Dry needling;Patient/family education;Balance training;Functional mobility training;Therapeutic exercise;Cryotherapy;ADLs/Self Care Home Management;Stair training;Energy conservation    PT Next Visit Plan increased ext strengthening/mobility, gait training    PT Home Exercise Plan lying prone, STS, modified thomas stretch    Consulted and Agree with Plan of Care Patient           Patient will benefit from skilled therapeutic intervention in order to improve the following deficits and impairments:  Cardiopulmonary status limiting activity,Decreased balance,Decreased coordination,Decreased mobility,Difficulty walking,Impaired flexibility,Impaired tone,Postural dysfunction,Abnormal gait,Decreased activity  tolerance,Decreased endurance,Decreased range of motion,Decreased strength,Increased fascial restricitons,Hypomobility,Pain,Improper body mechanics  Visit Diagnosis: Chronic bilateral low back pain without sciatica  Other abnormalities of gait and mobility     Problem List Patient Active Problem List   Diagnosis Date Noted  . Spondylosis without myelopathy or radiculopathy, lumbar region 11/06/2019  . 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 Chronic use of opiate for therapeutic purpose 12/14/2018  . Chronic pain syndrome 12/14/2018  . Thoracic radiculopathy 12/14/2018  . Acute on chronic respiratory failure with hypoxia ([place])   . Palliative care encounter   . Protein-calorie malnutrition, severe 02/06/2018  . Acute respiratory failure ([place]) 02/04/2018  . COPD (chronic obstructive pulmonary disease) ([place]) 03/22/2017  . Post herpetic neuralgia 07/30/2016  . Pneumonia 11/17/2015  . Long term current use of systemic steroids 02/14/2015  . Pulmonary hypertension ([place]) 10/05/2013  . Pulmonary sarcoidosis ([place]) 10/11/1999   Durwin Reges DPT Durwin Reges 04/10/2020, 10:40 AM  [facility] [address], [place], [address] Phone: [phone]   Fax:  [phone]  Name: Autumn [name] MRN: [id number] Date of Birth: [birth year]

## 2020-04-15 ENCOUNTER — Ambulatory Visit: Payer: Medicare PPO | Admitting: Physical Therapy

## 2020-04-15 ENCOUNTER — Ambulatory Visit
Payer: Medicare PPO | Attending: Student in an Organized Health Care Education/Training Program | Admitting: Physical Therapy

## 2020-04-15 ENCOUNTER — Other Ambulatory Visit: Payer: Self-pay

## 2020-04-15 ENCOUNTER — Encounter: Payer: Self-pay | Admitting: Physical Therapy

## 2020-04-15 DIAGNOSIS — R2689 Other abnormalities of gait and mobility: Secondary | ICD-10-CM | POA: Insufficient documentation

## 2020-04-15 DIAGNOSIS — G8929 Other chronic pain: Secondary | ICD-10-CM | POA: Diagnosis present

## 2020-04-15 DIAGNOSIS — M545 Low back pain, unspecified: Secondary | ICD-10-CM | POA: Insufficient documentation

## 2020-04-15 NOTE — Therapy (Signed)
[facility] [address] [place], [address] Phone: [phone]   Fax:  [phone]  Physical Therapy Treatment  Patient Details  Name: Autumn [name] MRN: [id number] Date of Birth: Jul 07, 1945 No data recorded  Encounter Date: 04/15/2020   PT End of Session - 04/15/20 1041    Visit Number 6    Number of Visits 17    Date for PT Re-Evaluation 05/16/20    Authorization - Visit Number 6    Authorization - Number of Visits 10    PT Start Time 1035    PT Stop Time 1115    PT Time Calculation (min) 40 min    Equipment Utilized During Treatment Gait belt    Activity Tolerance Patient tolerated treatment well    Behavior During Therapy WFL for tasks assessed/performed           Past Medical History:  Diagnosis Date  . Allergy   . Anemia   . Arthritis   . COPD (chronic obstructive pulmonary disease) (HCC)   . Glaucoma   . H/O uvulectomy   . Hypertension   . Osteoporosis   . Ovarian cancer (HCC)   . Postherpetic neuralgia   . Pulmonary sarcoidosis (HCC)   . Sarcoid     Past Surgical History:  Procedure Laterality Date  . ABDOMINAL HYSTERECTOMY    . BREAST EXCISIONAL BIOPSY Right    benign  . COLONOSCOPY WITH PROPOFOL N/A 07/30/2019   Procedure: COLONOSCOPY WITH PROPOFOL;  Surgeon: Toledo, Boykin Nearing, MD;  Location: [facility] ENDOSCOPY;  Service: Gastroenterology;  Laterality: N/A;  . DIAGNOSTIC LAPAROSCOPY    . DILATION AND CURETTAGE OF UTERUS    . EYE SURGERY    . VULVAR LESION REMOVAL      There were no vitals filed for this visit.   Subjective Assessment - 04/15/20 1039    Subjective Pt reports that she got her oxygen at home figured out yesterday, and is a little tired because of it, but did well overall. Reports nerve pain from past shingles infection, but very minimal LBP, which she is happy with. Reports most of her LBP is coming on mostly after strenuous activity.    Pertinent History Pt is a  75 year old female presenting with acute on chronic LBP with insideous onset a year ago. Reports she finds it difficult to walk upright d/t back pain, and can only walk . Pain is increased by all movements but she especially notices it when she is cooking and completing household tasks. Patient cannot see out of her R eye and has glaucoma in the L so her husband drives her to appointments and to church on Sundays. She has ramp at church, and only 2 steps to enter/exit her one story home that she is able to do without assistance with rail. Pt uses rollator in the community and occassional SPC in home. One chronic 2L O2 that she increases to 3 if she feels short of breath. Denies any falls in the past 6 months. LBP is across low back, denies radiating symptoms or numbness/tingling. Reports pain is 8/10 at its worst, and 0/10 at it's best when she has her pain medication. Currently 7/10. Pt denies N/V, B&B changes, unexplained weight fluctuation, saddle paresthesia, fever, night sweats, or unrelenting night pain at this time.    Limitations Walking    How long can you sit comfortably? unlimited "feels good"    How long can you stand comfortably?  22mins    How long can you walk comfortably? 70mins    Patient Stated Goals stand up tall and walk faster, without walker    Pain Onset More than a month ago             Ther-Ex Nustep seat 11 BUE12 L3 37minswith min cuing to keep SPM over 60; O2 monitored Sit to StandFrom elevated nustep seatwith 2# DB bilat hands overhead lift with stand3x 10 with min cuingto establish standing balance prior to sitting Step up onto 3in step x12 R x12 L 2 finger BUE support with cuing for "full stand" with good carry over Alt toe taps on 2 6in cones 3x 12 with unilateral 2 finger UE support supervision for safety   Gait Training  174ft with cognitive task (counting backward from 100 by 2) with some increased sway and speed variances with addition of this 328ft  w/ speed changes called out at random with CGA for safety amb to car 195ft with min cuing to "stay close to Rollator" with decent carry over                           PT Education - 04/15/20 1041    Education Details therex form/technique    Person(s) Educated Patient    Methods Explanation;Demonstration;Verbal cues    Comprehension Verbalized understanding;Returned demonstration;Verbal cues required            PT Short Term Goals - 03/25/20 1410      PT SHORT TERM GOAL #1   Title Pt will be independent with HEP in order to improve strength and decrease back pain in order to improve pain-free function at home and work.    Baseline 03/25/20 HEP given    Time 4    Period Weeks    Status New             PT Long Term Goals - 03/28/20 0930      PT LONG TERM GOAL #1   Title Pt will decrease worst back pain as reported on NPRS by at least 2 points in order to demonstrate clinically significant reduction in back pain.    Baseline 03/25/20 8/10    Time 8    Period Weeks    Status New      PT LONG TERM GOAL #2   Title Pt will increase 10MWT by at least 0.21m/s in order to demonstrate safe limited community speed and speed of independence with iADLs    Baseline 03/25/20 with rollator 0.46m/s    Time 8    Period Weeks      PT LONG TERM GOAL #3   Title Pt will decrease 5TSTS by at least 3 seconds in order to demonstrate clinically significant improvement in LE strength    Baseline 03/25/20 21.3sec    Time 8    Period Weeks    Status On-going      PT LONG TERM GOAL #4   Title Patient will increase FOTO score to 52 to demonstrate predicted increase in functional mobility to complete ADLs    Baseline 03/28/20 37    Time 8    Period Weeks      PT LONG TERM GOAL #5   Title Pt will increase 6MWT by at least 32m (157ft) in order to demonstrate clinically significant improvement in cardiopulmonary endurance and community ambulation    Baseline 03/28/20  422ft    Time 8    Period  Weeks    Status New                 Plan - 04/15/20 1047    Clinical Impression Statement PT continued therex progression for increased BLE and core strength, and increased upright/gait tolerance with success. Patient is able to comply with all cuing for proper technique of therex with good motivation throughout session and no increased pain. O2 monitored throughout session with gaurding needed for safety for most activity. PT will continue progression as able.    Personal Factors and Comorbidities Age;Comorbidity 1;Comorbidity 2;Comorbidity 3+;Past/Current Experience;Sex;Fitness;Time since onset of injury/illness/exacerbation    Comorbidities HTN, osteoporosis, COPD    Examination-Activity Limitations Lift;Stand;Transfers;Squat;Stairs;Locomotion Level    Examination-Participation Restrictions Community Activity;Laundry;Cleaning    Stability/Clinical Decision Making Evolving/Moderate complexity    Clinical Decision Making Moderate    Rehab Potential Good    PT Frequency 2x / week    PT Duration 8 weeks    PT Treatment/Interventions Electrical Stimulation;Moist Heat;Traction;Ultrasound;Gait training;Therapeutic activities;DME Instruction;Neuromuscular re-education;Manual techniques;Spinal Manipulations;Joint Manipulations;Passive range of motion;Dry needling;Patient/family education;Balance training;Functional mobility training;Therapeutic exercise;Cryotherapy;ADLs/Self Care Home Management;Stair training;Energy conservation    PT Next Visit Plan increased ext strengthening/mobility, gait training    PT Home Exercise Plan lying prone, STS, modified thomas stretch    Consulted and Agree with Plan of Care Patient           Patient will benefit from skilled therapeutic intervention in order to improve the following deficits and impairments:  Cardiopulmonary status limiting activity,Decreased balance,Decreased coordination,Decreased mobility,Difficulty  walking,Impaired flexibility,Impaired tone,Postural dysfunction,Abnormal gait,Decreased activity tolerance,Decreased endurance,Decreased range of motion,Decreased strength,Increased fascial restricitons,Hypomobility,Pain,Improper body mechanics  Visit Diagnosis: Chronic bilateral low back pain without sciatica  Other abnormalities of gait and mobility     Problem List Patient Active Problem List   Diagnosis Date Noted  . Spondylosis without myelopathy or radiculopathy, lumbar region 11/06/2019  . 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 Chronic use of opiate for therapeutic purpose 12/14/2018  . Chronic pain syndrome 12/14/2018  . Thoracic radiculopathy 12/14/2018  . Acute on chronic respiratory failure with hypoxia ([place])   . Palliative care encounter   . Protein-calorie malnutrition, severe 02/06/2018  . Acute respiratory failure ([place]) 02/04/2018  . COPD (chronic obstructive pulmonary disease) ([place]) 03/22/2017  . Post herpetic neuralgia 07/30/2016  . Pneumonia 11/17/2015  . Long term current use of systemic steroids 02/14/2015  . Pulmonary hypertension ([place]) 10/05/2013  . Pulmonary sarcoidosis ([place]) 10/11/1999   Durwin Reges DPT Durwin Reges 04/15/2020, 11:17 AM  [name] [address] [place], [address] Phone: [phone]   Fax:  [phone]  Name: LYLY CHIRIBOGA MRN: [id number] Date of Birth: 1945-10-13

## 2020-04-18 ENCOUNTER — Other Ambulatory Visit: Payer: Self-pay

## 2020-04-18 ENCOUNTER — Encounter: Payer: Self-pay | Admitting: Physical Therapy

## 2020-04-18 ENCOUNTER — Ambulatory Visit: Payer: Medicare PPO | Admitting: Physical Therapy

## 2020-04-18 DIAGNOSIS — G8929 Other chronic pain: Secondary | ICD-10-CM

## 2020-04-18 DIAGNOSIS — R2689 Other abnormalities of gait and mobility: Secondary | ICD-10-CM

## 2020-04-18 DIAGNOSIS — M545 Low back pain, unspecified: Secondary | ICD-10-CM | POA: Diagnosis not present

## 2020-04-18 NOTE — Therapy (Signed)
[facility] [address], [place], [address] Phone: [phone]   Fax:  [phone]  Physical Therapy Treatment  Patient Details  Name: Autumn [name] MRN: [id number] Date of Birth: 10/15/1945 No data recorded  Encounter Date: 04/18/2020   PT End of Session - 04/18/20 1101    Visit Number 7    Number of Visits 17    Date for PT Re-Evaluation 05/16/20    Authorization - Visit Number 7    Authorization - Number of Visits 10    PT Start Time [id number]    PT Stop Time [id number]    PT Time Calculation (min) 39 min    Equipment Utilized During Treatment Gait belt    Activity Tolerance Patient tolerated treatment well    Behavior During Therapy WFL for tasks assessed/performed           Past Medical History:  Diagnosis Date  . Allergy   . Anemia   . Arthritis   . COPD (chronic obstructive pulmonary disease) ([place])   . Glaucoma   . H/O uvulectomy   . Hypertension   . Osteoporosis   . Ovarian cancer ([place])   . Postherpetic neuralgia   . Pulmonary sarcoidosis ([place])   . Sarcoid     Past Surgical History:  Procedure Laterality Date  . ABDOMINAL HYSTERECTOMY    . BREAST EXCISIONAL BIOPSY Right    benign  . COLONOSCOPY WITH PROPOFOL N/A 07/30/2019   Procedure: COLONOSCOPY WITH PROPOFOL;  Surgeon: Toledo, Benay Pike, MD;  Location: [facility] ENDOSCOPY;  Service: Gastroenterology;  Laterality: N/A;  . DIAGNOSTIC LAPAROSCOPY    . DILATION AND CURETTAGE OF UTERUS    . EYE SURGERY    . VULVAR LESION REMOVAL      There were no vitals filed for this visit.   Subjective Assessment - 04/18/20 1013    Subjective Pt reports she is having same shingles pain, but her back is not hurting, which she is happy with. Compliance with HEP.    Pertinent History Pt is a 75 year old female presenting with acute on chronic LBP with insideous onset a year ago. Reports she finds it difficult to walk upright d/t back pain, and can only walk  35mins. Pain is increased by all movements but she especially notices it when she is cooking and completing household tasks. Patient cannot see out of her R eye and has glaucoma in the L so her husband drives her to appointments and to church on Sundays. She has ramp at church, and only 2 steps to enter/exit her one story home that she is able to do without assistance with rail. Pt uses rollator in the community and occassional SPC in home. One chronic 2L O2 that she increases to 3 if she feels short of breath. Denies any falls in the past 6 months. LBP is across low back, denies radiating symptoms or numbness/tingling. Reports pain is 8/10 at its worst, and 0/10 at it's best when she has her pain medication. Currently 7/10. Pt denies N/V, B&B changes, unexplained weight fluctuation, saddle paresthesia, fever, night sweats, or unrelenting night pain at this time.    Limitations Walking    How long can you sit comfortably? unlimited "feels good"    How long can you stand comfortably? 31mins    How long can you walk comfortably? 55mins    Patient Stated Goals stand up tall and walk faster, without walker    Pain Onset  More than a month ago             Ther-Ex Nustep seat 11 BUE12 L3 23minswith min cuing to keep SPM over 60; O2 monitored Sit to StandFrom elevated nustep seatwith 2# DB bilat hands overhead lift with stand3x 10 with min cuingto establish standing balance prior to sitting Alt side step onto 3in step 3x 12 2 finger BUE support with cuing for "full stand" with good carry over   Gait Training 26ft with cone negotiation with 1x 180d turn; 8x 90d turns with supervision for safety 339ft w/ speed changes called out at random with supervision for safety amb to car 152ft with min cuing to "stay close to Rollator" with decent carry over           PT Short Term Goals - 03/25/20 1410      PT SHORT TERM GOAL #1   Title Pt will be independent with HEP in order to improve  strength and decrease back pain in order to improve pain-free function at home and work.    Baseline 03/25/20 HEP given    Time 4    Period Weeks    Status New             PT Long Term Goals - 03/28/20 0930      PT LONG TERM GOAL #1   Title Pt will decrease worst back pain as reported on NPRS by at least 2 points in order to demonstrate clinically significant reduction in back pain.    Baseline 03/25/20 8/10    Time 8    Period Weeks    Status New      PT LONG TERM GOAL #2   Title Pt will increase 10MWT by at least 0.83m/s in order to demonstrate safe limited community speed and speed of independence with iADLs    Baseline 03/25/20 with rollator 0.78m/s    Time 8    Period Weeks      PT LONG TERM GOAL #3   Title Pt will decrease 5TSTS by at least 3 seconds in order to demonstrate clinically significant improvement in LE strength    Baseline 03/25/20 21.3sec    Time 8    Period Weeks    Status On-going      PT LONG TERM GOAL #4   Title Patient will increase FOTO score to 52 to demonstrate predicted increase in functional mobility to complete ADLs    Baseline 03/28/20 37    Time 8    Period Weeks      PT LONG TERM GOAL #5   Title Pt will increase 6MWT by at least 16m (159ft) in order to demonstrate clinically significant improvement in cardiopulmonary endurance and community ambulation    Baseline 03/28/20 412ft    Time 8    Period Weeks    Status New                 Plan - 04/18/20 1102    Clinical Impression Statement PT continued therex progression for increased BLE and core strength, increased upright tolerance, and dynamic balance with gait with success. Patient is able to complete all therex and gait training with proper technique following cuing, with gaurding for safety. PT will continue progression as able.    Personal Factors and Comorbidities Age;Comorbidity 1;Comorbidity 2;Comorbidity 3+;Past/Current Experience;Sex;Fitness;Time since onset of  injury/illness/exacerbation    Comorbidities HTN, osteoporosis, COPD    Examination-Activity Limitations Lift;Stand;Transfers;Squat;Stairs;Locomotion Level    Examination-Participation Restrictions Community Activity;Laundry;Cleaning  Stability/Clinical Decision Making Evolving/Moderate complexity    Clinical Decision Making Moderate    Rehab Potential Good    PT Frequency 2x / week    PT Duration 8 weeks    PT Treatment/Interventions Electrical Stimulation;Moist Heat;Traction;Ultrasound;Gait training;Therapeutic activities;DME Instruction;Neuromuscular re-education;Manual techniques;Spinal Manipulations;Joint Manipulations;Passive range of motion;Dry needling;Patient/family education;Balance training;Functional mobility training;Therapeutic exercise;Cryotherapy;ADLs/Self Care Home Management;Stair training;Energy conservation    PT Next Visit Plan increased ext strengthening/mobility, gait training    PT Home Exercise Plan lying prone, STS, modified thomas stretch    Consulted and Agree with Plan of Care Patient           Patient will benefit from skilled therapeutic intervention in order to improve the following deficits and impairments:  Cardiopulmonary status limiting activity,Decreased balance,Decreased coordination,Decreased mobility,Difficulty walking,Impaired flexibility,Impaired tone,Postural dysfunction,Abnormal gait,Decreased activity tolerance,Decreased endurance,Decreased range of motion,Decreased strength,Increased fascial restricitons,Hypomobility,Pain,Improper body mechanics  Visit Diagnosis: Chronic bilateral low back pain without sciatica  Other abnormalities of gait and mobility     Problem List Patient Active Problem List   Diagnosis Date Noted  . Spondylosis without myelopathy or radiculopathy, lumbar region 11/06/2019  . 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 Chronic use of opiate for therapeutic purpose 12/14/2018  . Chronic pain syndrome  12/14/2018  . Thoracic radiculopathy 12/14/2018  . Acute on chronic respiratory failure with hypoxia ([place])   . Palliative care encounter   . Protein-calorie malnutrition, severe 02/06/2018  . Acute respiratory failure ([place]) 02/04/2018  . COPD (chronic obstructive pulmonary disease) ([place]) 03/22/2017  . Post herpetic neuralgia 07/30/2016  . Pneumonia 11/17/2015  . Long term current use of systemic steroids 02/14/2015  . Pulmonary hypertension ([place]) 10/05/2013  . Pulmonary sarcoidosis ([place]) 10/11/1999   Durwin Reges DPT Durwin Reges 04/18/2020, 11:05 AM  [facility] [address] [place], [address] Phone: [phone]   Fax:  [phone]  Name: SHANTORIA ELLWOOD MRN: [id number] Date of Birth: [birth year]

## 2020-04-22 ENCOUNTER — Encounter: Payer: Self-pay | Admitting: Physical Therapy

## 2020-04-22 ENCOUNTER — Other Ambulatory Visit: Payer: Self-pay

## 2020-04-22 ENCOUNTER — Ambulatory Visit: Payer: Medicare PPO | Admitting: Physical Therapy

## 2020-04-22 DIAGNOSIS — M545 Low back pain, unspecified: Secondary | ICD-10-CM | POA: Diagnosis not present

## 2020-04-22 DIAGNOSIS — G8929 Other chronic pain: Secondary | ICD-10-CM

## 2020-04-22 DIAGNOSIS — R2689 Other abnormalities of gait and mobility: Secondary | ICD-10-CM

## 2020-04-22 NOTE — Therapy (Signed)
[facility] [address] [place], [address] Phone: [phone]   Fax:  [phone]  Physical Therapy Treatment  Patient Details  Name: Autumn [name] MRN: [id number] Date of Birth: 01-20-1946 No data recorded  Encounter Date: 04/22/2020   PT End of Session - 04/22/20 1448    Visit Number 8    Number of Visits 17    Date for PT Re-Evaluation 05/16/20    Authorization - Visit Number 8    Authorization - Number of Visits 10    PT Start Time 0230    PT Stop Time 0310    PT Time Calculation (min) 40 min    Equipment Utilized During Treatment Gait belt    Activity Tolerance Patient tolerated treatment well    Behavior During Therapy WFL for tasks assessed/performed           Past Medical History:  Diagnosis Date  . Allergy   . Anemia   . Arthritis   . COPD (chronic obstructive pulmonary disease) ([place])   . Glaucoma   . H/O uvulectomy   . Hypertension   . Osteoporosis   . Ovarian cancer ([place])   . Postherpetic neuralgia   . Pulmonary sarcoidosis ([place])   . Sarcoid     Past Surgical History:  Procedure Laterality Date  . ABDOMINAL HYSTERECTOMY    . BREAST EXCISIONAL BIOPSY Right    benign  . COLONOSCOPY WITH PROPOFOL N/A 07/30/2019   Procedure: COLONOSCOPY WITH PROPOFOL;  Surgeon: Toledo, Benay Pike, MD;  Location: [facility] ENDOSCOPY;  Service: Gastroenterology;  Laterality: N/A;  . DIAGNOSTIC LAPAROSCOPY    . DILATION AND CURETTAGE OF UTERUS    . EYE SURGERY    . VULVAR LESION REMOVAL      There were no vitals filed for this visit.   Subjective Assessment - 04/22/20 1436    Subjective Reports she is feeling good today. Yesterday she had some increased LBP, but feels good now. Compliance with HEP and no falls since last visit.    Pertinent History Pt is a 75 year old female presenting with acute on chronic LBP with insideous onset a year ago. Reports she finds it difficult to walk upright d/t back  pain, and can only walk 28mins. Pain is increased by all movements but she especially notices it when she is cooking and completing household tasks. Patient cannot see out of her R eye and has glaucoma in the L so her husband drives her to appointments and to church on Sundays. She has ramp at church, and only 2 steps to enter/exit her one story home that she is able to do without assistance with rail. Pt uses rollator in the community and occassional SPC in home. One chronic 2L O2 that she increases to 3 if she feels short of breath. Denies any falls in the past 6 months. LBP is across low back, denies radiating symptoms or numbness/tingling. Reports pain is 8/10 at its worst, and 0/10 at it's best when she has her pain medication. Currently 7/10. Pt denies N/V, B&B changes, unexplained weight fluctuation, saddle paresthesia, fever, night sweats, or unrelenting night pain at this time.    Limitations Walking    How long can you sit comfortably? unlimited "feels good"    How long can you stand comfortably? 18mins    How long can you walk comfortably? 56mins    Patient Stated Goals stand up tall and walk faster, without walker  Pain Onset More than a month ago              Ther-Ex Nustep seat 11 BUE12 L3 42minswith min cuing to keep SPM over 60; O2 monitored Sit to StandFrom elevatednustep seatwith4# DB BUE flex3x 10 with supervision for safety, cuing for full hip ext Seated palloff press 2x 10 bilat with cuing throughout for upright posture with decent carry over   Gait Training 559ft with sudden stops, 90d and 180d turns, backward steps with CGA for safety amb to car 138ft with min cuing to "stay close to Rollator" with decent carry over          PT Education - 04/22/20 1440    Education Details therex form/technique, gait training    Person(s) Educated Patient    Methods Explanation;Demonstration;Verbal cues    Comprehension Verbalized understanding;Returned  demonstration;Verbal cues required            PT Short Term Goals - 03/25/20 1410      PT SHORT TERM GOAL #1   Title Pt will be independent with HEP in order to improve strength and decrease back pain in order to improve pain-free function at home and work.    Baseline 03/25/20 HEP given    Time 4    Period Weeks    Status New             PT Long Term Goals - 03/28/20 0930      PT LONG TERM GOAL #1   Title Pt will decrease worst back pain as reported on NPRS by at least 2 points in order to demonstrate clinically significant reduction in back pain.    Baseline 03/25/20 8/10    Time 8    Period Weeks    Status New      PT LONG TERM GOAL #2   Title Pt will increase 10MWT by at least 0.80m/s in order to demonstrate safe limited community speed and speed of independence with iADLs    Baseline 03/25/20 with rollator 0.65m/s    Time 8    Period Weeks      PT LONG TERM GOAL #3   Title Pt will decrease 5TSTS by at least 3 seconds in order to demonstrate clinically significant improvement in LE strength    Baseline 03/25/20 21.3sec    Time 8    Period Weeks    Status On-going      PT LONG TERM GOAL #4   Title Patient will increase FOTO score to 52 to demonstrate predicted increase in functional mobility to complete ADLs    Baseline 03/28/20 37    Time 8    Period Weeks      PT LONG TERM GOAL #5   Title Pt will increase 6MWT by at least 53m (122ft) in order to demonstrate clinically significant improvement in cardiopulmonary endurance and community ambulation    Baseline 03/28/20 411ft    Time 8    Period Weeks    Status New                 Plan - 04/22/20 1506    Clinical Impression Statement PT continued therex progression for increased BLE and core strength and dynamic balance with gait with success. Patient is able to comply with all cuing for proper technique of therex with good motivation throughout session. Pt tolerates increased gait/upright progression  with O2 levels monitored throughotu session, and gaurding for safety.  PT will continue progression as able.  Personal Factors and Comorbidities Age;Comorbidity 1;Comorbidity 2;Comorbidity 3+;Past/Current Experience;Sex;Fitness;Time since onset of injury/illness/exacerbation    Comorbidities HTN, osteoporosis, COPD    Examination-Activity Limitations Lift;Stand;Transfers;Squat;Stairs;Locomotion Level    Examination-Participation Restrictions Community Activity;Laundry;Cleaning    Stability/Clinical Decision Making Evolving/Moderate complexity    Clinical Decision Making Moderate    Rehab Potential Good    PT Frequency 2x / week    PT Duration 8 weeks    PT Treatment/Interventions Electrical Stimulation;Moist Heat;Traction;Ultrasound;Gait training;Therapeutic activities;DME Instruction;Neuromuscular re-education;Manual techniques;Spinal Manipulations;Joint Manipulations;Passive range of motion;Dry needling;Patient/family education;Balance training;Functional mobility training;Therapeutic exercise;Cryotherapy;ADLs/Self Care Home Management;Stair training;Energy conservation    PT Next Visit Plan increased ext strengthening/mobility, gait training    PT Home Exercise Plan lying prone, STS, modified thomas stretch    Consulted and Agree with Plan of Care Patient           Patient will benefit from skilled therapeutic intervention in order to improve the following deficits and impairments:  Cardiopulmonary status limiting activity,Decreased balance,Decreased coordination,Decreased mobility,Difficulty walking,Impaired flexibility,Impaired tone,Postural dysfunction,Abnormal gait,Decreased activity tolerance,Decreased endurance,Decreased range of motion,Decreased strength,Increased fascial restricitons,Hypomobility,Pain,Improper body mechanics  Visit Diagnosis: Chronic bilateral low back pain without sciatica  Other abnormalities of gait and mobility     Problem List Patient Active Problem  List   Diagnosis Date Noted  . Spondylosis without myelopathy or radiculopathy, lumbar region 11/06/2019  . 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 Chronic use of opiate for therapeutic purpose 12/14/2018  . Chronic pain syndrome 12/14/2018  . Thoracic radiculopathy 12/14/2018  . Acute on chronic respiratory failure with hypoxia ([place])   . Palliative care encounter   . Protein-calorie malnutrition, severe 02/06/2018  . Acute respiratory failure ([place]) 02/04/2018  . COPD (chronic obstructive pulmonary disease) ([place]) 03/22/2017  . Post herpetic neuralgia 07/30/2016  . Pneumonia 11/17/2015  . Long term current use of systemic steroids 02/14/2015  . Pulmonary hypertension ([place]) 10/05/2013  . Pulmonary sarcoidosis ([place]) 10/11/1999   Durwin Reges DPT Durwin Reges 04/22/2020, 3:51 PM  [facility] [address], [place], [address] Phone: [phone]   Fax:  [phone]  Name: Autumn [name] MRN: [id number] Date of Birth: [birth year]

## 2020-04-24 ENCOUNTER — Other Ambulatory Visit: Payer: Self-pay

## 2020-04-24 ENCOUNTER — Ambulatory Visit: Payer: Medicare PPO | Admitting: Physical Therapy

## 2020-04-24 ENCOUNTER — Encounter: Payer: Self-pay | Admitting: Physical Therapy

## 2020-04-24 DIAGNOSIS — M545 Low back pain, unspecified: Secondary | ICD-10-CM | POA: Diagnosis not present

## 2020-04-24 DIAGNOSIS — G8929 Other chronic pain: Secondary | ICD-10-CM

## 2020-04-24 NOTE — Therapy (Signed)
[facility] [address], [place], [address] Phone: [phone]   Fax:  [phone]  Physical Therapy Treatment  Patient Details  Name: Autumn [name] MRN: [id number] Date of Birth: July 22, 1945 No data recorded  Encounter Date: 04/24/2020   PT End of Session - 04/24/20 1601    Visit Number 9    Number of Visits 17    Date for PT Re-Evaluation 05/16/20    Authorization - Visit Number 9    Authorization - Number of Visits 10    PT Start Time 3295    PT Stop Time 1625    PT Time Calculation (min) 40 min    Equipment Utilized During Treatment Gait belt    Activity Tolerance Patient tolerated treatment well    Behavior During Therapy WFL for tasks assessed/performed           Past Medical History:  Diagnosis Date  . Allergy   . Anemia   . Arthritis   . COPD (chronic obstructive pulmonary disease) ([place])   . Glaucoma   . H/O uvulectomy   . Hypertension   . Osteoporosis   . Ovarian cancer ([place])   . Postherpetic neuralgia   . Pulmonary sarcoidosis ([place])   . Sarcoid     Past Surgical History:  Procedure Laterality Date  . ABDOMINAL HYSTERECTOMY    . BREAST EXCISIONAL BIOPSY Right    benign  . COLONOSCOPY WITH PROPOFOL N/A 07/30/2019   Procedure: COLONOSCOPY WITH PROPOFOL;  Surgeon: Toledo, Benay Pike, MD;  Location: [facility] ENDOSCOPY;  Service: Gastroenterology;  Laterality: N/A;  . DIAGNOSTIC LAPAROSCOPY    . DILATION AND CURETTAGE OF UTERUS    . EYE SURGERY    . VULVAR LESION REMOVAL      There were no vitals filed for this visit.   Subjective Assessment - 04/24/20 1550    Subjective Reports she is feeling well. Still has some increases in LBP with ADLs. Reports that she has been walking around the house with a cane, no reports of loss of balance or unsteadiness.    Pertinent History Pt is a 75 year old female presenting with acute on chronic LBP with insideous onset a year ago. Reports she finds  it difficult to walk upright d/t back pain, and can only walk 52mins. Pain is increased by all movements but she especially notices it when she is cooking and completing household tasks. Patient cannot see out of her R eye and has glaucoma in the L so her husband drives her to appointments and to church on Sundays. She has ramp at church, and only 2 steps to enter/exit her one story home that she is able to do without assistance with rail. Pt uses rollator in the community and occassional SPC in home. One chronic 2L O2 that she increases to 3 if she feels short of breath. Denies any falls in the past 6 months. LBP is across low back, denies radiating symptoms or numbness/tingling. Reports pain is 8/10 at its worst, and 0/10 at it's best when she has her pain medication. Currently 7/10. Pt denies N/V, B&B changes, unexplained weight fluctuation, saddle paresthesia, fever, night sweats, or unrelenting night pain at this time.    Limitations Walking    How long can you sit comfortably? unlimited "feels good"    How long can you stand comfortably? 43mins    How long can you walk comfortably? 31mins    Patient Stated Goals stand up  tall and walk faster, without walker           Ther-Ex Nustep seat 11 BUE12 L3 34minswith min cuing to keep SPM over 60; O2 monitored Sit to StandFrom elevatednustep seatwith4# DB BUE flex3x 10 with supervision for safety, cuing for full hip ext    Gait Training 578ft with no stops with cueing for upright posture using SPC Obstacle negotiation weaving in between cones Side stepping and backward stepping 4x10 steps with good carry over                       PT Education - 04/24/20 1556    Education Details therex form/technique, gait training    Person(s) Educated Patient    Methods Explanation;Demonstration;Verbal cues    Comprehension Verbalized understanding;Returned demonstration;Verbal cues required            PT Short Term Goals -  03/25/20 1410      PT SHORT TERM GOAL #1   Title Pt will be independent with HEP in order to improve strength and decrease back pain in order to improve pain-free function at home and work.    Baseline 03/25/20 HEP given    Time 4    Period Weeks    Status New             PT Long Term Goals - 03/28/20 0930      PT LONG TERM GOAL #1   Title Pt will decrease worst back pain as reported on NPRS by at least 2 points in order to demonstrate clinically significant reduction in back pain.    Baseline 03/25/20 8/10    Time 8    Period Weeks    Status New      PT LONG TERM GOAL #2   Title Pt will increase 10MWT by at least 0.60m/s in order to demonstrate safe limited community speed and speed of independence with iADLs    Baseline 03/25/20 with rollator 0.24m/s    Time 8    Period Weeks      PT LONG TERM GOAL #3   Title Pt will decrease 5TSTS by at least 3 seconds in order to demonstrate clinically significant improvement in LE strength    Baseline 03/25/20 21.3sec    Time 8    Period Weeks    Status On-going      PT LONG TERM GOAL #4   Title Patient will increase FOTO score to 52 to demonstrate predicted increase in functional mobility to complete ADLs    Baseline 03/28/20 37    Time 8    Period Weeks      PT LONG TERM GOAL #5   Title Pt will increase 6MWT by at least 65m (14ft) in order to demonstrate clinically significant improvement in cardiopulmonary endurance and community ambulation    Baseline 03/28/20 433ft    Time 8    Period Weeks    Status New                 Plan - 04/24/20 1613    Clinical Impression Statement Continued progression for increase in BLE strength and dynamic balance with gait with good carry over into functional movements. Pt tolerated gait endurance using a SPC while monitoring O2, gaurding for safety and no reports of unsteadiness. Pt shows abilty to comply with cueing for technique. PT will continue to progress    Personal Factors and  Comorbidities Age;Comorbidity 1;Comorbidity 2;Comorbidity 3+;Past/Current Experience;Sex;Fitness;Time since onset of injury/illness/exacerbation  Comorbidities HTN, osteoporosis, COPD    Examination-Activity Limitations Lift;Stand;Transfers;Squat;Stairs;Locomotion Level    Examination-Participation Restrictions Community Activity;Laundry;Cleaning    Stability/Clinical Decision Making Evolving/Moderate complexity    Clinical Decision Making Moderate    Rehab Potential Good    PT Frequency 2x / week    PT Duration 8 weeks    PT Treatment/Interventions Electrical Stimulation;Moist Heat;Traction;Ultrasound;Gait training;Therapeutic activities;DME Instruction;Neuromuscular re-education;Manual techniques;Spinal Manipulations;Joint Manipulations;Passive range of motion;Dry needling;Patient/family education;Balance training;Functional mobility training;Therapeutic exercise;Cryotherapy;ADLs/Self Care Home Management;Stair training;Energy conservation    PT Next Visit Plan increased ext strengthening/mobility, gait training    PT Home Exercise Plan lying prone, STS, modified thomas stretch    Consulted and Agree with Plan of Care Patient           Patient will benefit from skilled therapeutic intervention in order to improve the following deficits and impairments:  Cardiopulmonary status limiting activity,Decreased balance,Decreased coordination,Decreased mobility,Difficulty walking,Impaired flexibility,Impaired tone,Postural dysfunction,Abnormal gait,Decreased activity tolerance,Decreased endurance,Decreased range of motion,Decreased strength,Increased fascial restricitons,Hypomobility,Pain,Improper body mechanics  Visit Diagnosis: Chronic midline low back pain without sciatica     Problem List Patient Active Problem List   Diagnosis Date Noted  . Spondylosis without myelopathy or radiculopathy, lumbar region 11/06/2019  . 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 Chronic use of  opiate for therapeutic purpose 12/14/2018  . Chronic pain syndrome 12/14/2018  . Thoracic radiculopathy 12/14/2018  . Acute on chronic respiratory failure with hypoxia ([place])   . Palliative care encounter   . Protein-calorie malnutrition, severe 02/06/2018  . Acute respiratory failure ([place]) 02/04/2018  . COPD (chronic obstructive pulmonary disease) ([place]) 03/22/2017  . Post herpetic neuralgia 07/30/2016  . Pneumonia 11/17/2015  . Long term current use of systemic steroids 02/14/2015  . Pulmonary hypertension ([place]) 10/05/2013  . Pulmonary sarcoidosis ([place]) 10/11/1999   Durwin Reges DPT Durwin Reges 04/24/2020, 5:37 PM  [facility] [address], [place], [address] Phone: [phone]   Fax:  [phone]  Name: Autumn [name] MRN: [id number] Date of Birth: May 01, 1945

## 2020-04-28 ENCOUNTER — Ambulatory Visit: Payer: Self-pay | Admitting: Podiatry

## 2020-04-29 ENCOUNTER — Ambulatory Visit: Payer: Medicare PPO | Admitting: Physical Therapy

## 2020-05-01 ENCOUNTER — Ambulatory Visit: Payer: Medicare PPO | Admitting: Physical Therapy

## 2020-05-05 ENCOUNTER — Ambulatory Visit: Payer: Medicare PPO | Admitting: Podiatry

## 2020-05-05 ENCOUNTER — Other Ambulatory Visit: Payer: Self-pay

## 2020-05-05 ENCOUNTER — Encounter: Payer: Self-pay | Admitting: Podiatry

## 2020-05-05 DIAGNOSIS — I10 Essential (primary) hypertension: Secondary | ICD-10-CM | POA: Insufficient documentation

## 2020-05-05 DIAGNOSIS — B351 Tinea unguium: Secondary | ICD-10-CM | POA: Diagnosis not present

## 2020-05-05 DIAGNOSIS — M79675 Pain in left toe(s): Secondary | ICD-10-CM | POA: Diagnosis not present

## 2020-05-05 DIAGNOSIS — M810 Age-related osteoporosis without current pathological fracture: Secondary | ICD-10-CM | POA: Insufficient documentation

## 2020-05-05 DIAGNOSIS — M79674 Pain in right toe(s): Secondary | ICD-10-CM

## 2020-05-05 DIAGNOSIS — B0229 Other postherpetic nervous system involvement: Secondary | ICD-10-CM

## 2020-05-05 DIAGNOSIS — E785 Hyperlipidemia, unspecified: Secondary | ICD-10-CM | POA: Insufficient documentation

## 2020-05-05 NOTE — Progress Notes (Signed)
This patient returns to my office for at risk foot care.  This patient requires this care by a professional since this patient will be at risk due to having post herpetic neuralgia. This patient is unable to cut nails herself since the patient cannot reach her nails.These nails are painful walking and wearing shoes.  This patient presents for at risk foot care today.  General Appearance  Alert, conversant and in no acute stress.  Vascular  Dorsalis pedis  are palpable  bilaterally. Posterior tibial pulses are absent  Bilateral. Capillary return is within normal limits  bilaterally. Cold feet.   Bilaterally. Absent digital hair  Bilateral.  Neurologic  Senn-Weinstein monofilament wire test within normal limits  bilaterally. Muscle power within normal limits bilaterally.  Nails Thick disfigured discolored nails with subungual debris  Hallux nails  bilaterally. No evidence of bacterial infection or drainage bilaterally.  Orthopedic  No limitations of motion  feet .  No crepitus or effusions noted.  No bony pathology or digital deformities noted.  Skin  normotropic skin with no porokeratosis noted bilaterally.  No signs of infections or ulcers noted.     Onychomycosis  Pain in right toes  Pain in left toes  Consent was obtained for treatment procedures.   Mechanical debridement of nails 1-5  bilaterally performed with a nail nipper.  Filed with dremel without incident.    Return office visit  3 months                    Told patient to return for periodic foot care and evaluation due to potential at risk complications.   Gardiner Barefoot DPM

## 2020-05-06 ENCOUNTER — Encounter: Payer: Self-pay | Admitting: Physical Therapy

## 2020-05-06 ENCOUNTER — Ambulatory Visit: Payer: Medicare PPO | Admitting: Physical Therapy

## 2020-05-06 DIAGNOSIS — G8929 Other chronic pain: Secondary | ICD-10-CM

## 2020-05-06 DIAGNOSIS — M545 Low back pain, unspecified: Secondary | ICD-10-CM

## 2020-05-06 NOTE — Therapy (Signed)
[facility] [address] [place], [address] Phone: [phone]   Fax:  [phone]  Physical Therapy Treatment/Progress Note Reporting period 03/24/20 - 05/06/20   Patient Details  Name: Autumn [name] MRN: [id number] Date of Birth: 1946-03-16 No data recorded  Encounter Date: 05/06/2020   PT End of Session - 05/06/20 1011    Visit Number 10    Number of Visits 17    Date for PT Re-Evaluation 05/16/20    Authorization - Visit Number 10    Authorization - Number of Visits 10    PT Start Time 0945    PT Stop Time 1025    PT Time Calculation (min) 40 min    Equipment Utilized During Treatment Gait belt    Activity Tolerance Patient tolerated treatment well    Behavior During Therapy WFL for tasks assessed/performed           Past Medical History:  Diagnosis Date  . Allergy   . Anemia   . Arthritis   . COPD (chronic obstructive pulmonary disease) ([facility])   . Glaucoma   . H/O uvulectomy   . Hypertension   . Osteoporosis   . Ovarian cancer ([facility])   . Postherpetic neuralgia   . Pulmonary sarcoidosis ([facility])   . Sarcoid     Past Surgical History:  Procedure Laterality Date  . ABDOMINAL HYSTERECTOMY    . BREAST EXCISIONAL BIOPSY Right    benign  . COLONOSCOPY WITH PROPOFOL N/A 07/30/2019   Procedure: COLONOSCOPY WITH PROPOFOL;  Surgeon: Toledo, Boykin Nearing, MD;  Location: [facility] ENDOSCOPY;  Service: Gastroenterology;  Laterality: N/A;  . DIAGNOSTIC LAPAROSCOPY    . DILATION AND CURETTAGE OF UTERUS    . EYE SURGERY    . VULVAR LESION REMOVAL      There were no vitals filed for this visit.   Subjective Assessment - 05/06/20 0945    Subjective Reports she is still having some LBP currently 7/10 but overall doing well.    Pertinent History Pt is a 75 year old female presenting with acute on chronic LBP with insideous onset a year ago. Reports she finds it difficult to walk upright d/t back pain, and can  only walk . Pain is increased by all movements but she especially notices it when she is cooking and completing household tasks. Patient cannot see out of her R eye and has glaucoma in the L so her husband drives her to appointments and to church on Sundays. She has ramp at church, and only 2 steps to enter/exit her one story home that she is able to do without assistance with rail. Pt uses rollator in the community and occassional SPC in home. One chronic 2L O2 that she increases to 3 if she feels short of breath. Denies any falls in the past 6 months. LBP is across low back, denies radiating symptoms or numbness/tingling. Reports pain is 8/10 at its worst, and 0/10 at it's best when she has her pain medication. Currently 7/10. Pt denies N/V, B&B changes, unexplained weight fluctuation, saddle paresthesia, fever, night sweats, or unrelenting night pain at this time.    Limitations Walking    How long can you sit comfortably? unlimited "feels good"    How long can you stand comfortably?    How long can you walk comfortably?    Patient Stated Goals stand up tall and walk faster, without walker    Pain Onset More than  a month ago              Ther-Ex 5TSTS x 2; first set for warm up STS from standard chair w/ 2# DB 3x 10/9; x8 without DB due to fatigue  Gait Training 6MWT with cuing to keep walker closer 10MWT x2 for best score Supervision for safety with all ambulation  O2 monitored throughout session with appropriate rests utilized. Cuing for PLB to return O2 >90% 1x during therex. Education on assessment results and need for continued intervention with understanding.                        PT Education - 05/06/20 1011    Education Details therex form/technique, gait training    Person(s) Educated Patient    Methods Explanation;Demonstration;Verbal cues    Comprehension Verbalized understanding;Returned demonstration;Verbal cues required             PT Short Term Goals - 03/25/20 1410      PT SHORT TERM GOAL #1   Title Pt will be independent with HEP in order to improve strength and decrease back pain in order to improve pain-free function at home and work.    Baseline 03/25/20 HEP given    Time 4    Period Weeks    Status New             PT Long Term Goals - 05/06/20 1154      PT LONG TERM GOAL #1   Title Pt will decrease worst back pain as reported on NPRS by at least 2 points in order to demonstrate clinically significant reduction in back pain.    Baseline 03/25/20 8/10; 05/06/20 7/10    Time 8    Period Weeks    Status On-going      PT LONG TERM GOAL #2   Title Pt will increase 10MWT to at least 1.43m/s in order to demonstrate safe and independent community speed and iADLs    Baseline 03/25/20 with rollator 0.41m/s; 05/06/20 with rollator 0.79m/s    Time 8    Period Weeks    Status Revised      PT LONG TERM GOAL #3   Title pt will decrease 5TSTS by at least 3 seconds in order to demonstrate age matched norms in BLE strength needed for ind transfers.    Baseline 03/25/20 21.3sec; 05/06/20 15sec    Time 8    Period Weeks    Status Revised      PT LONG TERM GOAL #4   Title Patient will increase FOTO score to 52 to demonstrate predicted increase in functional mobility to complete ADLs    Baseline 03/28/20 37; 05/06/20 57    Time 8    Period Weeks    Status Achieved      PT LONG TERM GOAL #5   Title Pt will increase 6MWT by at least 5m (165ft) in order to demonstrate clinically significant improvement in cardiopulmonary endurance and community ambulation    Baseline 03/28/20 450ft; 05/06/20 581 ft    Time 8    Period Weeks    Status On-going                 Plan - 05/06/20 1018    Clinical Impression Statement PT reassessed goals this session in which patient was able to achieve goals set in the 10MWT, 5STS, and FOTO. Pt did not meet goal for NPRS and 6MWT but showed improvements from baseline. Pt  demonstrated increases in BLE strength and endurance for household independence, with goals updated to reflect need for progress toward safe community ambulation speed, and age-martched strength norms. Pt had no increases in pain and had good motivation throughout session. Pt will continue to benefit from skilled PT.    Personal Factors and Comorbidities Age;Comorbidity 1;Comorbidity 2;Comorbidity 3+;Past/Current Experience;Sex;Fitness;Time since onset of injury/illness/exacerbation    Examination-Activity Limitations Lift;Stand;Transfers;Squat;Stairs;Locomotion Level    Examination-Participation Restrictions Community Activity;Laundry;Cleaning    Stability/Clinical Decision Making Evolving/Moderate complexity    Clinical Decision Making Moderate    Rehab Potential Good    PT Frequency 2x / week    PT Duration 8 weeks    PT Treatment/Interventions Electrical Stimulation;Moist Heat;Traction;Ultrasound;Gait training;Therapeutic activities;DME Instruction;Neuromuscular re-education;Manual techniques;Spinal Manipulations;Joint Manipulations;Passive range of motion;Dry needling;Patient/family education;Balance training;Functional mobility training;Therapeutic exercise;Cryotherapy;ADLs/Self Care Home Management;Stair training;Energy conservation    PT Next Visit Plan increased ext strengthening/mobility, gait training    PT Home Exercise Plan lying prone, STS, modified thomas stretch    Consulted and Agree with Plan of Care Patient           Patient will benefit from skilled therapeutic intervention in order to improve the following deficits and impairments:  Cardiopulmonary status limiting activity,Decreased balance,Decreased coordination,Decreased mobility,Difficulty walking,Impaired flexibility,Impaired tone,Postural dysfunction,Abnormal gait,Decreased activity tolerance,Decreased endurance,Decreased range of motion,Decreased strength,Increased fascial restricitons,Hypomobility,Pain,Improper body  mechanics  Visit Diagnosis: Chronic bilateral low back pain without sciatica     Problem List Patient Active Problem List   Diagnosis Date Noted  . Hyperlipidemia 05/05/2020  . Hypertension 05/05/2020  . OP (osteoporosis) 05/05/2020  . Pain due to onychomycosis of toenails of both feet 05/05/2020  . Spondylosis without myelopathy or radiculopathy, lumbar region 11/06/2019  . 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 Chronic use of opiate for therapeutic purpose 12/14/2018  . Chronic pain syndrome 12/14/2018  . Thoracic radiculopathy 12/14/2018  . 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 Depression, prolonged 04/07/2018  . Acute on chronic respiratory failure with hypoxia ([place])   . Palliative care encounter   . Protein-calorie malnutrition, severe 02/06/2018  . Acute respiratory failure ([place]) 02/04/2018  . COPD (chronic obstructive pulmonary disease) ([place]) 03/22/2017  . Post herpetic neuralgia 07/30/2016  . Hypercalcemia 02/17/2016  . Chronic angle-closure glaucoma of both eyes, severe stage 01/20/2016  . Age-related nuclear cataract of both eyes 12/17/2015  . Pneumonia 11/17/2015  . Long term current use of systemic steroids 02/14/2015  . Pulmonary hypertension ([place]) 10/05/2013  . 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 Abnormal Pap smear of vagina 05/31/2012  . 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 Ovarian cancer ([place]) 12/11/2009  . Pulmonary sarcoidosis ([place]) 10/11/1999   Durwin Reges DPT Turner Daniels Durwin Reges 05/06/2020, 12:33 PM  [facility] [address] [place], [address] Phone: [phone]   Fax:  [phone]  Name: Autumn [name] MRN: [id number] Date of Birth: 1946/03/21

## 2020-05-08 ENCOUNTER — Ambulatory Visit: Payer: Medicare PPO | Admitting: Physical Therapy

## 2020-05-08 ENCOUNTER — Encounter: Payer: Self-pay | Admitting: Physical Therapy

## 2020-05-08 ENCOUNTER — Other Ambulatory Visit: Payer: Self-pay

## 2020-05-08 DIAGNOSIS — M545 Low back pain, unspecified: Secondary | ICD-10-CM | POA: Diagnosis not present

## 2020-05-08 DIAGNOSIS — G8929 Other chronic pain: Secondary | ICD-10-CM

## 2020-05-08 NOTE — Therapy (Signed)
[facility] [address] [place], [address] Phone: [phone]   Fax:  [phone]  Physical Therapy Treatment  Patient Details  Name: Autumn [name] MRN: [id number] Date of Birth: 04-23-1945 No data recorded  Encounter Date: 05/08/2020   PT End of Session - 05/08/20 1519    Visit Number 11    Number of Visits 17    Date for PT Re-Evaluation 05/16/20    Authorization - Visit Number 1    Authorization - Number of Visits 10    PT Start Time 1601    PT Stop Time 1555    PT Time Calculation (min) 40 min    Equipment Utilized During Treatment Gait belt    Activity Tolerance Patient tolerated treatment well    Behavior During Therapy WFL for tasks assessed/performed           Past Medical History:  Diagnosis Date  . Allergy   . Anemia   . Arthritis   . COPD (chronic obstructive pulmonary disease) ([place])   . Glaucoma   . H/O uvulectomy   . Hypertension   . Osteoporosis   . Ovarian cancer ([place])   . Postherpetic neuralgia   . Pulmonary sarcoidosis ([place])   . Sarcoid     Past Surgical History:  Procedure Laterality Date  . ABDOMINAL HYSTERECTOMY    . BREAST EXCISIONAL BIOPSY Right    benign  . COLONOSCOPY WITH PROPOFOL N/A 07/30/2019   Procedure: COLONOSCOPY WITH PROPOFOL;  Surgeon: Toledo, Benay Pike, MD;  Location: [facility] ENDOSCOPY;  Service: Gastroenterology;  Laterality: N/A;  . DIAGNOSTIC LAPAROSCOPY    . DILATION AND CURETTAGE OF UTERUS    . EYE SURGERY    . VULVAR LESION REMOVAL      There were no vitals filed for this visit.   Subjective Assessment - 05/08/20 1517    Subjective Reports LBP today currently 6/10; otherwise no other concerns.    Pertinent History Pt is a 75 year old female presenting with acute on chronic LBP with insideous onset a year ago. Reports she finds it difficult to walk upright d/t back pain, and can only walk 1mins. Pain is increased by all movements but she  especially notices it when she is cooking and completing household tasks. Patient cannot see out of her R eye and has glaucoma in the L so her husband drives her to appointments and to church on Sundays. She has ramp at church, and only 2 steps to enter/exit her one story home that she is able to do without assistance with rail. Pt uses rollator in the community and occassional SPC in home. One chronic 2L O2 that she increases to 3 if she feels short of breath. Denies any falls in the past 6 months. LBP is across low back, denies radiating symptoms or numbness/tingling. Reports pain is 8/10 at its worst, and 0/10 at it's best when she has her pain medication. Currently 7/10. Pt denies N/V, B&B changes, unexplained weight fluctuation, saddle paresthesia, fever, night sweats, or unrelenting night pain at this time.    Limitations Walking    How long can you sit comfortably? unlimited "feels good"    How long can you stand comfortably? 18mins    How long can you walk comfortably? 40mins    Patient Stated Goals stand up tall and walk faster, without walker    Pain Onset More than a month ago  Ther-Ex Nustep seat 11 BUE 12 L3 61mins with min cuing to keep SPM over 60; O2 monitored Sit to Stand From standard seat with 2# DB BUE flex 3x 10/9/8 with supervision for safety, cuing for full hip ext   Gait Training  583ft with no stops with cueing for upright posture using SPC Obstacle negotiation weaving in between cones; focus with side stepping and backwards stepping; cuing to not rotate body during side stepping                        PT Education - 05/08/20 1518    Education Details therex form/technique; gait training    Person(s) Educated Patient    Methods Explanation;Demonstration;Verbal cues    Comprehension Verbalized understanding;Returned demonstration;Verbal cues required            PT Short Term Goals - 03/25/20 1410      PT SHORT TERM GOAL #1    Title Pt will be independent with HEP in order to improve strength and decrease back pain in order to improve pain-free function at home and work.    Baseline 03/25/20 HEP given    Time 4    Period Weeks    Status New             PT Long Term Goals - 05/06/20 1154      PT LONG TERM GOAL #1   Title Pt will decrease worst back pain as reported on NPRS by at least 2 points in order to demonstrate clinically significant reduction in back pain.    Baseline 03/25/20 8/10; 05/06/20 7/10    Time 8    Period Weeks    Status On-going      PT LONG TERM GOAL #2   Title Pt will increase 10MWT to at least 1.62m/s in order to demonstrate safe and independent community speed and iADLs    Baseline 03/25/20 with rollator 0.73m/s; 05/06/20 with rollator 0.68m/s    Time 8    Period Weeks    Status Revised      PT LONG TERM GOAL #3   Title pt will decrease 5TSTS by at least 3 seconds in order to demonstrate age matched norms in BLE strength needed for ind transfers.    Baseline 03/25/20 21.3sec; 05/06/20 15sec    Time 8    Period Weeks    Status Revised      PT LONG TERM GOAL #4   Title Patient will increase FOTO score to 52 to demonstrate predicted increase in functional mobility to complete ADLs    Baseline 03/28/20 37; 05/06/20 57    Time 8    Period Weeks    Status Achieved      PT LONG TERM GOAL #5   Title Pt will increase 6MWT by at least 76m (173ft) in order to demonstrate clinically significant improvement in cardiopulmonary endurance and community ambulation    Baseline 03/28/20 499ft; 05/06/20 581 ft    Time 8    Period Weeks    Status On-going                 Plan - 05/08/20 1521    Clinical Impression Statement Continued progression for increase in BLE strength and dynamic balance with gait with good carry over into functional movements. Pt tolerated gait endurance using a SPC while monitoring O2, gaurding for safety and no reports of unsteadiness. Pt shows abilty to comply  with cuing for technique. PT will continue  to progress    Personal Factors and Comorbidities Age;Comorbidity 1;Comorbidity 2;Comorbidity 3+;Past/Current Experience;Sex;Fitness;Time since onset of injury/illness/exacerbation    Comorbidities HTN, osteoporosis, COPD    Examination-Activity Limitations Lift;Stand;Transfers;Squat;Stairs;Locomotion Level    Examination-Participation Restrictions Community Activity;Laundry;Cleaning    Stability/Clinical Decision Making Evolving/Moderate complexity    Clinical Decision Making Moderate    Rehab Potential Good    PT Frequency 2x / week    PT Duration 8 weeks    PT Treatment/Interventions Electrical Stimulation;Moist Heat;Traction;Ultrasound;Gait training;Therapeutic activities;DME Instruction;Neuromuscular re-education;Manual techniques;Spinal Manipulations;Joint Manipulations;Passive range of motion;Dry needling;Patient/family education;Balance training;Functional mobility training;Therapeutic exercise;Cryotherapy;ADLs/Self Care Home Management;Stair training;Energy conservation    PT Next Visit Plan increased ext strengthening/mobility, gait training    PT Home Exercise Plan lying prone, STS, modified thomas stretch    Consulted and Agree with Plan of Care Patient           Patient will benefit from skilled therapeutic intervention in order to improve the following deficits and impairments:  Cardiopulmonary status limiting activity,Decreased balance,Decreased coordination,Decreased mobility,Difficulty walking,Impaired flexibility,Impaired tone,Postural dysfunction,Abnormal gait,Decreased activity tolerance,Decreased endurance,Decreased range of motion,Decreased strength,Increased fascial restricitons,Hypomobility,Pain,Improper body mechanics  Visit Diagnosis: Chronic bilateral low back pain without sciatica     Problem List Patient Active Problem List   Diagnosis Date Noted  . Hyperlipidemia 05/05/2020  . Hypertension 05/05/2020  . OP  (osteoporosis) 05/05/2020  . Pain due to onychomycosis of toenails of both feet 05/05/2020  . Spondylosis without myelopathy or radiculopathy, lumbar region 11/06/2019  . 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 Chronic use of opiate for therapeutic purpose 12/14/2018  . Chronic pain syndrome 12/14/2018  . Thoracic radiculopathy 12/14/2018  . 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 Depression, prolonged 04/07/2018  . Acute on chronic respiratory failure with hypoxia ([place])   . Palliative care encounter   . Protein-calorie malnutrition, severe 02/06/2018  . Acute respiratory failure ([place]) 02/04/2018  . COPD (chronic obstructive pulmonary disease) ([place]) 03/22/2017  . Post herpetic neuralgia 07/30/2016  . Hypercalcemia 02/17/2016  . Chronic angle-closure glaucoma of both eyes, severe stage 01/20/2016  . Age-related nuclear cataract of both eyes 12/17/2015  . Pneumonia 11/17/2015  . Long term current use of systemic steroids 02/14/2015  . Pulmonary hypertension ([place]) 10/05/2013  . 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 Abnormal Pap smear of vagina 05/31/2012  . 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 Ovarian cancer ([place]) 12/11/2009  . Pulmonary sarcoidosis ([place]) 10/11/1999   Durwin Reges DPT Durwin Reges 05/08/2020, 6:19 PM  [facility] [address] [place], [address] Phone: [phone]   Fax:  [phone]  Name: Autumn [name] MRN: [id number] Date of Birth: Mar 02, 1946

## 2020-05-13 ENCOUNTER — Other Ambulatory Visit: Payer: Self-pay

## 2020-05-13 ENCOUNTER — Encounter: Payer: Self-pay | Admitting: Physical Therapy

## 2020-05-13 ENCOUNTER — Ambulatory Visit
Payer: Medicare PPO | Attending: Student in an Organized Health Care Education/Training Program | Admitting: Physical Therapy

## 2020-05-13 DIAGNOSIS — G8929 Other chronic pain: Secondary | ICD-10-CM | POA: Insufficient documentation

## 2020-05-13 DIAGNOSIS — M545 Low back pain, unspecified: Secondary | ICD-10-CM | POA: Insufficient documentation

## 2020-05-13 DIAGNOSIS — R2689 Other abnormalities of gait and mobility: Secondary | ICD-10-CM | POA: Diagnosis present

## 2020-05-13 NOTE — Therapy (Signed)
[facility] [address], [place], [address] Phone: [phone]   Fax:  [phone]  Physical Therapy Treatment  Patient Details  Name: Autumn [name] MRN: [id number] Date of Birth: [birth year] No data recorded  Encounter Date: 05/13/2020   PT End of Session - 05/13/20 0950    Visit Number 12    Number of Visits 17    Date for PT Re-Evaluation 05/16/20    Authorization - Visit Number 2    Authorization - Number of Visits 10    PT Start Time 0945    PT Stop Time 1025    PT Time Calculation (min) 40 min    Equipment Utilized During Treatment Gait belt    Activity Tolerance Patient tolerated treatment well    Behavior During Therapy WFL for tasks assessed/performed           Past Medical History:  Diagnosis Date  . Allergy   . Anemia   . Arthritis   . COPD (chronic obstructive pulmonary disease) ([place])   . Glaucoma   . H/O uvulectomy   . Hypertension   . Osteoporosis   . Ovarian cancer ([place])   . Postherpetic neuralgia   . Pulmonary sarcoidosis ([place])   . Sarcoid     Past Surgical History:  Procedure Laterality Date  . ABDOMINAL HYSTERECTOMY    . BREAST EXCISIONAL BIOPSY Right    benign  . COLONOSCOPY WITH PROPOFOL N/A 07/30/2019   Procedure: COLONOSCOPY WITH PROPOFOL;  Surgeon: Toledo, Benay Pike, MD;  Location: [facility] ENDOSCOPY;  Service: Gastroenterology;  Laterality: N/A;  . DIAGNOSTIC LAPAROSCOPY    . DILATION AND CURETTAGE OF UTERUS    . EYE SURGERY    . VULVAR LESION REMOVAL      There were no vitals filed for this visit.   Subjective Assessment - 05/13/20 0945    Subjective Reports no changes from last visit; otherwise doing well    Pertinent History Pt is a 75 year old female presenting with acute on chronic LBP with insideous onset a year ago. Reports she finds it difficult to walk upright d/t back pain, and can only walk 60mins. Pain is increased by all movements but she especially  notices it when she is cooking and completing household tasks. Patient cannot see out of her R eye and has glaucoma in the L so her husband drives her to appointments and to church on Sundays. She has ramp at church, and only 2 steps to enter/exit her one story home that she is able to do without assistance with rail. Pt uses rollator in the community and occassional SPC in home. One chronic 2L O2 that she increases to 3 if she feels short of breath. Denies any falls in the past 6 months. LBP is across low back, denies radiating symptoms or numbness/tingling. Reports pain is 8/10 at its worst, and 0/10 at it's best when she has her pain medication. Currently 7/10. Pt denies N/V, B&B changes, unexplained weight fluctuation, saddle paresthesia, fever, night sweats, or unrelenting night pain at this time.    Limitations Walking    How long can you sit comfortably? unlimited "feels good"    How long can you stand comfortably? 69mins    How long can you walk comfortably? 29mins    Patient Stated Goals stand up tall and walk faster, without walker    Pain Onset More than a month ago  Ther-Ex Nustep seat 11 BUE 12 L4 30mins with min cuing to keep SPM over 60; O2 monitored Sit to Stand From standard seat with 2# DB BUE flex 3x 10 with supervision for safety, cuing for full hip ext    Gait Training  540ft with no stops with cueing for upright posture using SPC; with Obstacle negotiation weaving in between cones; call outs for changes in direction.                           PT Education - 05/13/20 0949    Education Details therex form/technique; gait training    Person(s) Educated Patient    Methods Explanation;Demonstration;Verbal cues    Comprehension Verbalized understanding;Returned demonstration;Verbal cues required            PT Short Term Goals - 03/25/20 1410      PT SHORT TERM GOAL #1   Title Pt will be independent with HEP in order to improve strength  and decrease back pain in order to improve pain-free function at home and work.    Baseline 03/25/20 HEP given    Time 4    Period Weeks    Status New             PT Long Term Goals - 05/06/20 1154      PT LONG TERM GOAL #1   Title Pt will decrease worst back pain as reported on NPRS by at least 2 points in order to demonstrate clinically significant reduction in back pain.    Baseline 03/25/20 8/10; 05/06/20 7/10    Time 8    Period Weeks    Status On-going      PT LONG TERM GOAL #2   Title Pt will increase 10MWT to at least 1.74m/s in order to demonstrate safe and independent community speed and iADLs    Baseline 03/25/20 with rollator 0.27m/s; 05/06/20 with rollator 0.63m/s    Time 8    Period Weeks    Status Revised      PT LONG TERM GOAL #3   Title pt will decrease 5TSTS by at least 3 seconds in order to demonstrate age matched norms in BLE strength needed for ind transfers.    Baseline 03/25/20 21.3sec; 05/06/20 15sec    Time 8    Period Weeks    Status Revised      PT LONG TERM GOAL #4   Title Patient will increase FOTO score to 52 to demonstrate predicted increase in functional mobility to complete ADLs    Baseline 03/28/20 37; 05/06/20 57    Time 8    Period Weeks    Status Achieved      PT LONG TERM GOAL #5   Title Pt will increase 6MWT by at least 69m (116ft) in order to demonstrate clinically significant improvement in cardiopulmonary endurance and community ambulation    Baseline 03/28/20 437ft; 05/06/20 581 ft    Time 8    Period Weeks    Status On-going                 Plan - 05/13/20 0951    Clinical Impression Statement Continued progression for increased BLE strength and dynamic balance with gait with good carry over. pt was able to tolerate increase in nustep resistance and repititions w/ STS with no decreases in O2, slight increase in pain with STS but was able to tolerate therex. pt tolerated gait endurance using SPC and gaurding for  safety and  no reports of unsteadiness. PT will continue to progress as able.    Personal Factors and Comorbidities Age;Comorbidity 1;Comorbidity 2;Comorbidity 3+;Past/Current Experience;Sex;Fitness;Time since onset of injury/illness/exacerbation    Comorbidities HTN, osteoporosis, COPD    Examination-Activity Limitations Lift;Stand;Transfers;Squat;Stairs;Locomotion Level    Examination-Participation Restrictions Community Activity;Laundry;Cleaning    Stability/Clinical Decision Making Evolving/Moderate complexity    Clinical Decision Making Moderate    Rehab Potential Good    PT Frequency 2x / week    PT Duration 8 weeks    PT Treatment/Interventions Electrical Stimulation;Moist Heat;Traction;Ultrasound;Gait training;Therapeutic activities;DME Instruction;Neuromuscular re-education;Manual techniques;Spinal Manipulations;Joint Manipulations;Passive range of motion;Dry needling;Patient/family education;Balance training;Functional mobility training;Therapeutic exercise;Cryotherapy;ADLs/Self Care Home Management;Stair training;Energy conservation    PT Next Visit Plan increased ext strengthening/mobility, gait training    PT Home Exercise Plan lying prone, STS, modified thomas stretch    Consulted and Agree with Plan of Care Patient           Patient will benefit from skilled therapeutic intervention in order to improve the following deficits and impairments:  Cardiopulmonary status limiting activity,Decreased balance,Decreased coordination,Decreased mobility,Difficulty walking,Impaired flexibility,Impaired tone,Postural dysfunction,Abnormal gait,Decreased activity tolerance,Decreased endurance,Decreased range of motion,Decreased strength,Increased fascial restricitons,Hypomobility,Pain,Improper body mechanics  Visit Diagnosis: Chronic right-sided low back pain without sciatica     Problem List Patient Active Problem List   Diagnosis Date Noted  . Hyperlipidemia 05/05/2020  . Hypertension 05/05/2020   . OP (osteoporosis) 05/05/2020  . Pain due to onychomycosis of toenails of both feet 05/05/2020  . Spondylosis without myelopathy or radiculopathy, lumbar region 11/06/2019  . 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 Chronic use of opiate for therapeutic purpose 12/14/2018  . Chronic pain syndrome 12/14/2018  . Thoracic radiculopathy 12/14/2018  . 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 Depression, prolonged 04/07/2018  . Acute on chronic respiratory failure with hypoxia ([place])   . Palliative care encounter   . Protein-calorie malnutrition, severe 02/06/2018  . Acute respiratory failure ([place]) 02/04/2018  . COPD (chronic obstructive pulmonary disease) ([place]) 03/22/2017  . Post herpetic neuralgia 07/30/2016  . Hypercalcemia 02/17/2016  . Chronic angle-closure glaucoma of both eyes, severe stage 01/20/2016  . Age-related nuclear cataract of both eyes 12/17/2015  . Pneumonia 11/17/2015  . Long term current use of systemic steroids 02/14/2015  . Pulmonary hypertension ([place]) 10/05/2013  . 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 Abnormal Pap smear of vagina 05/31/2012  . 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 Ovarian cancer ([place]) 12/11/2009  . Pulmonary sarcoidosis ([place]) 10/11/1999    Durwin Reges DPT Turner Daniels, SPT  Durwin Reges 05/13/2020, 2:41 PM  [facility] [address] [place], [address] Phone: [phone]   Fax:  [phone]  Name: Autumn [name] MRN: [id number] Date of Birth: 1945/04/25

## 2020-05-15 ENCOUNTER — Ambulatory Visit: Payer: Medicare PPO | Admitting: Physical Therapy

## 2020-05-15 ENCOUNTER — Encounter: Payer: Self-pay | Admitting: Physical Therapy

## 2020-05-15 ENCOUNTER — Other Ambulatory Visit: Payer: Self-pay

## 2020-05-15 DIAGNOSIS — R2689 Other abnormalities of gait and mobility: Secondary | ICD-10-CM

## 2020-05-15 DIAGNOSIS — M545 Low back pain, unspecified: Secondary | ICD-10-CM

## 2020-05-15 DIAGNOSIS — G8929 Other chronic pain: Secondary | ICD-10-CM

## 2020-05-15 NOTE — Therapy (Signed)
[facility] [address], [place], [address] Phone: [phone]   Fax:  [phone]  Physical Therapy Treatment  Patient Details  Name: Autumn [name] MRN: [id number] Date of Birth: [birth year] No data recorded  Encounter Date: 05/15/2020   PT End of Session - 05/15/20 1054    Visit Number 13    Number of Visits 17    Date for PT Re-Evaluation 05/16/20    Authorization - Visit Number 3    Authorization - Number of Visits 10    PT Start Time 1034    PT Stop Time 1115    PT Time Calculation (min) 41 min    Equipment Utilized During Treatment Gait belt    Activity Tolerance Patient tolerated treatment well    Behavior During Therapy WFL for tasks assessed/performed           Past Medical History:  Diagnosis Date  . Allergy   . Anemia   . Arthritis   . COPD (chronic obstructive pulmonary disease) ([place])   . Glaucoma   . H/O uvulectomy   . Hypertension   . Osteoporosis   . Ovarian cancer ([place])   . Postherpetic neuralgia   . Pulmonary sarcoidosis ([place])   . Sarcoid     Past Surgical History:  Procedure Laterality Date  . ABDOMINAL HYSTERECTOMY    . BREAST EXCISIONAL BIOPSY Right    benign  . COLONOSCOPY WITH PROPOFOL N/A 07/30/2019   Procedure: COLONOSCOPY WITH PROPOFOL;  Surgeon: Toledo, Benay Pike, MD;  Location: [facility] ENDOSCOPY;  Service: Gastroenterology;  Laterality: N/A;  . DIAGNOSTIC LAPAROSCOPY    . DILATION AND CURETTAGE OF UTERUS    . EYE SURGERY    . VULVAR LESION REMOVAL      There were no vitals filed for this visit.   Subjective Assessment - 05/15/20 1040    Subjective Reports 6/10 pain in the back, but that it is not too bad. Is more troubled by nerve pain from past shingles today.    Pertinent History Pt is a 75 year old female presenting with acute on chronic LBP with insideous onset a year ago. Reports she finds it difficult to walk upright d/t back pain, and can only walk  38mins. Pain is increased by all movements but she especially notices it when she is cooking and completing household tasks. Patient cannot see out of her R eye and has glaucoma in the L so her husband drives her to appointments and to church on Sundays. She has ramp at church, and only 2 steps to enter/exit her one story home that she is able to do without assistance with rail. Pt uses rollator in the community and occassional SPC in home. One chronic 2L O2 that she increases to 3 if she feels short of breath. Denies any falls in the past 6 months. LBP is across low back, denies radiating symptoms or numbness/tingling. Reports pain is 8/10 at its worst, and 0/10 at it's best when she has her pain medication. Currently 7/10. Pt denies N/V, B&B changes, unexplained weight fluctuation, saddle paresthesia, fever, night sweats, or unrelenting night pain at this time.    Limitations Walking    How long can you sit comfortably? unlimited "feels good"    How long can you stand comfortably? 59mins    How long can you walk comfortably? 39mins    Patient Stated Goals stand up tall and walk faster, without walker    Pain  Onset More than a month ago              Ther-Ex Nustep seat 11 BUE12 L4 41minswith min cuing to keep SPM over 60; O2 monitored Sit to StandFrom standard seatwith4# DBwith both UEsx10; with focus on fast stand and "barely touching chair like there is a sharp tac on it" 2x 6 for power emphasis with good carry over  Gait Training 2x 2ft red light green light with supervision, min difficulty with short walk/stop intervals 232ft with speed changes called out at random supervision for safety, encouragement for posture and fastest speed 269ft with cone negotiation x12 some near 90d turns, 1x 180d turn with good speed supervision <> CGA for safety                        PT Education - 05/15/20 1054    Education Details therex form/techniwue, gait training     Person(s) Educated Patient    Methods Explanation;Demonstration;Verbal cues    Comprehension Verbalized understanding;Returned demonstration;Verbal cues required            PT Short Term Goals - 03/25/20 1410      PT SHORT TERM GOAL #1   Title Pt will be independent with HEP in order to improve strength and decrease back pain in order to improve pain-free function at home and work.    Baseline 03/25/20 HEP given    Time 4    Period Weeks    Status New             PT Long Term Goals - 05/06/20 1154      PT LONG TERM GOAL #1   Title Pt will decrease worst back pain as reported on NPRS by at least 2 points in order to demonstrate clinically significant reduction in back pain.    Baseline 03/25/20 8/10; 05/06/20 7/10    Time 8    Period Weeks    Status On-going      PT LONG TERM GOAL #2   Title Pt will increase 10MWT to at least 1.45m/s in order to demonstrate safe and independent community speed and iADLs    Baseline 03/25/20 with rollator 0.21m/s; 05/06/20 with rollator 0.12m/s    Time 8    Period Weeks    Status Revised      PT LONG TERM GOAL #3   Title pt will decrease 5TSTS by at least 3 seconds in order to demonstrate age matched norms in BLE strength needed for ind transfers.    Baseline 03/25/20 21.3sec; 05/06/20 15sec    Time 8    Period Weeks    Status Revised      PT LONG TERM GOAL #4   Title Patient will increase FOTO score to 52 to demonstrate predicted increase in functional mobility to complete ADLs    Baseline 03/28/20 37; 05/06/20 57    Time 8    Period Weeks    Status Achieved      PT LONG TERM GOAL #5   Title Pt will increase 6MWT by at least 51m (133ft) in order to demonstrate clinically significant improvement in cardiopulmonary endurance and community ambulation    Baseline 03/28/20 431ft; 05/06/20 581 ft    Time 8    Period Weeks    Status On-going                 Plan - 05/15/20 1102    Clinical Impression Statement Continued  progression for  increased BLE and core strength for ind transfers, and gait training for community ambulation distance, speed, and obstacle negotiation. Patient is able to comply with all cuing for proper technique of therex and gait training with good motivation throughout session, normal vitals (monitored throughout session). PT will continue progression as able.    Personal Factors and Comorbidities Age;Comorbidity 1;Comorbidity 2;Comorbidity 3+;Past/Current Experience;Sex;Fitness;Time since onset of injury/illness/exacerbation    Comorbidities HTN, osteoporosis, COPD    Examination-Activity Limitations Lift;Stand;Transfers;Squat;Stairs;Locomotion Level    Examination-Participation Restrictions Community Activity;Laundry;Cleaning    Stability/Clinical Decision Making Evolving/Moderate complexity    Clinical Decision Making Moderate    Rehab Potential Good    PT Duration 8 weeks    PT Treatment/Interventions Electrical Stimulation;Moist Heat;Traction;Ultrasound;Gait training;Therapeutic activities;DME Instruction;Neuromuscular re-education;Manual techniques;Spinal Manipulations;Joint Manipulations;Passive range of motion;Dry needling;Patient/family education;Balance training;Functional mobility training;Therapeutic exercise;Cryotherapy;ADLs/Self Care Home Management;Stair training;Energy conservation    PT Next Visit Plan increased ext strengthening/mobility, gait training    PT Home Exercise Plan lying prone, STS, modified thomas stretch    Consulted and Agree with Plan of Care Patient           Patient will benefit from skilled therapeutic intervention in order to improve the following deficits and impairments:  Cardiopulmonary status limiting activity,Decreased balance,Decreased coordination,Decreased mobility,Difficulty walking,Impaired flexibility,Impaired tone,Postural dysfunction,Abnormal gait,Decreased activity tolerance,Decreased endurance,Decreased range of motion,Decreased  strength,Increased fascial restricitons,Hypomobility,Pain,Improper body mechanics  Visit Diagnosis: Chronic right-sided low back pain without sciatica  Chronic bilateral low back pain without sciatica  Chronic midline low back pain without sciatica  Other abnormalities of gait and mobility     Problem List Patient Active Problem List   Diagnosis Date Noted  . Hyperlipidemia 05/05/2020  . Hypertension 05/05/2020  . OP (osteoporosis) 05/05/2020  . Pain due to onychomycosis of toenails of both feet 05/05/2020  . Spondylosis without myelopathy or radiculopathy, lumbar region 11/06/2019  . 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 Chronic use of opiate for therapeutic purpose 12/14/2018  . Chronic pain syndrome 12/14/2018  . Thoracic radiculopathy 12/14/2018  . 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 Depression, prolonged 04/07/2018  . Acute on chronic respiratory failure with hypoxia ([place])   . Palliative care encounter   . Protein-calorie malnutrition, severe 02/06/2018  . Acute respiratory failure ([place]) 02/04/2018  . COPD (chronic obstructive pulmonary disease) ([place]) 03/22/2017  . Post herpetic neuralgia 07/30/2016  . Hypercalcemia 02/17/2016  . Chronic angle-closure glaucoma of both eyes, severe stage 01/20/2016  . Age-related nuclear cataract of both eyes 12/17/2015  . Pneumonia 11/17/2015  . Long term current use of systemic steroids 02/14/2015  . Pulmonary hypertension ([place]) 10/05/2013  . 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 Abnormal Pap smear of vagina 05/31/2012  . 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 Ovarian cancer ([place]) 12/11/2009  . Pulmonary sarcoidosis ([place]) 10/11/1999   Durwin Reges DPT Durwin Reges 05/15/2020, 11:41 AM  [facility] [address], [place], [address] Phone: [phone]   Fax:  [phone]  Name: Autumn [name] MRN: [id number] Date of Birth: 1945/06/09

## 2020-05-20 ENCOUNTER — Other Ambulatory Visit: Payer: Self-pay

## 2020-05-20 ENCOUNTER — Ambulatory Visit: Payer: Medicare PPO | Admitting: Physical Therapy

## 2020-05-20 ENCOUNTER — Encounter: Payer: Self-pay | Admitting: Physical Therapy

## 2020-05-20 DIAGNOSIS — M545 Low back pain, unspecified: Secondary | ICD-10-CM | POA: Diagnosis not present

## 2020-05-20 DIAGNOSIS — G8929 Other chronic pain: Secondary | ICD-10-CM

## 2020-05-20 NOTE — Therapy (Signed)
[facility] [address] [place], [address] Phone: [phone]   Fax:  [phone]  Physical Therapy Treatment  Patient Details  Name: Autumn [name] MRN: [id number] Date of Birth: 1945/08/29 No data recorded  Encounter Date: 05/20/2020   PT End of Session - 05/20/20 0952    Visit Number 15    Number of Visits 25    Date for PT Re-Evaluation 06/13/20    Authorization - Visit Number 4    Authorization - Number of Visits 10    PT Start Time 0945    PT Stop Time 1025    PT Time Calculation (min) 40 min    Equipment Utilized During Treatment Gait belt    Activity Tolerance Patient tolerated treatment well    Behavior During Therapy WFL for tasks assessed/performed           Past Medical History:  Diagnosis Date  . Allergy   . Anemia   . Arthritis   . COPD (chronic obstructive pulmonary disease) ([place])   . Glaucoma   . H/O uvulectomy   . Hypertension   . Osteoporosis   . Ovarian cancer ([place])   . Postherpetic neuralgia   . Pulmonary sarcoidosis ([place])   . Sarcoid     Past Surgical History:  Procedure Laterality Date  . ABDOMINAL HYSTERECTOMY    . BREAST EXCISIONAL BIOPSY Right    benign  . COLONOSCOPY WITH PROPOFOL N/A 07/30/2019   Procedure: COLONOSCOPY WITH PROPOFOL;  Surgeon: Toledo, Benay Pike, MD;  Location: [facility] ENDOSCOPY;  Service: Gastroenterology;  Laterality: N/A;  . DIAGNOSTIC LAPAROSCOPY    . DILATION AND CURETTAGE OF UTERUS    . EYE SURGERY    . VULVAR LESION REMOVAL      There were no vitals filed for this visit.   Subjective Assessment - 05/20/20 0946    Subjective Reports usual back pain intensity 6/10, otherwise doing well.    Pertinent History Pt is a 75 year old female presenting with acute on chronic LBP with insideous onset a year ago. Reports she finds it difficult to walk upright d/t back pain, and can only walk 58mins. Pain is increased by all movements but she  especially notices it when she is cooking and completing household tasks. Patient cannot see out of her R eye and has glaucoma in the L so her husband drives her to appointments and to church on Sundays. She has ramp at church, and only 2 steps to enter/exit her one story home that she is able to do without assistance with rail. Pt uses rollator in the community and occassional SPC in home. One chronic 2L O2 that she increases to 3 if she feels short of breath. Denies any falls in the past 6 months. LBP is across low back, denies radiating symptoms or numbness/tingling. Reports pain is 8/10 at its worst, and 0/10 at it's best when she has her pain medication. Currently 7/10. Pt denies N/V, B&B changes, unexplained weight fluctuation, saddle paresthesia, fever, night sweats, or unrelenting night pain at this time.    Limitations Walking    How long can you sit comfortably? unlimited "feels good"    How long can you stand comfortably? 68mins    How long can you walk comfortably? 96mins    Patient Stated Goals stand up tall and walk faster, without walker    Pain Onset More than a month ago  Ther-Ex Nustep seat 11 BUE 12 L4 41mins with min cuing to keep SPM over 60; O2 monitored  Sit to Stand From standard seat with 4# DB with both UEs x10; with focus on fast stand and "barely touching chair like there is a sharp tac on it" 3x 6 for power emphasis with good carry over    Gait Training  2x 61ft red light green light with supervision, min difficulty with short walk/stop intervals  228ft with speed changes; focus on fast walk along straight away and normal speed along turns with focus on trying to regain breathe  272ft with cone negotiation along straight away; cuing to go fast at safe speed when negotiating in/out cones.                          PT Education - 05/20/20 [phone]    Education Details therex form/ technique, gait training    Person(s) Educated Patient     Methods Explanation;Demonstration;Verbal cues    Comprehension Verbalized understanding;Returned demonstration;Verbal cues required            PT Short Term Goals - 03/25/20 1410      PT SHORT TERM GOAL #1   Title Pt will be independent with HEP in order to improve strength and decrease back pain in order to improve pain-free function at home and work.    Baseline 03/25/20 HEP given    Time 4    Period Weeks    Status New             PT Long Term Goals - 05/06/20 1154      PT LONG TERM GOAL #1   Title Pt will decrease worst back pain as reported on NPRS by at least 2 points in order to demonstrate clinically significant reduction in back pain.    Baseline 03/25/20 8/10; 05/06/20 7/10    Time 8    Period Weeks    Status On-going      PT LONG TERM GOAL #2   Title Pt will increase 10MWT to at least 1.23m/s in order to demonstrate safe and independent community speed and iADLs    Baseline 03/25/20 with rollator 0.68m/s; 05/06/20 with rollator 0.12m/s    Time 8    Period Weeks    Status Revised      PT LONG TERM GOAL #3   Title pt will decrease 5TSTS by at least 3 seconds in order to demonstrate age matched norms in BLE strength needed for ind transfers.    Baseline 03/25/20 21.3sec; 05/06/20 15sec    Time 8    Period Weeks    Status Revised      PT LONG TERM GOAL #4   Title Patient will increase FOTO score to 52 to demonstrate predicted increase in functional mobility to complete ADLs    Baseline 03/28/20 37; 05/06/20 57    Time 8    Period Weeks    Status Achieved      PT LONG TERM GOAL #5   Title Pt will increase 6MWT by at least 74m (153ft) in order to demonstrate clinically significant improvement in cardiopulmonary endurance and community ambulation    Baseline 03/28/20 489ft; 05/06/20 581 ft    Time 8    Period Weeks    Status On-going                 Plan - 05/20/20 1000    Clinical Impression Statement Continued progression for  increased BLE and  core strength for carrying over into independent transers, gait training for community amulation. Pt is able to perform proper technique with min cuing and demonstration with no increases in pain. Pt was able to maintain normal O2 sat with increased demands of speed. PT will continue to progress as able.    Personal Factors and Comorbidities Age;Comorbidity 1;Comorbidity 2;Comorbidity 3+;Past/Current Experience;Sex;Fitness;Time since onset of injury/illness/exacerbation    Comorbidities HTN, osteoporosis, COPD    Examination-Activity Limitations Lift;Stand;Transfers;Squat;Stairs;Locomotion Level    Examination-Participation Restrictions Community Activity;Laundry;Cleaning    Stability/Clinical Decision Making Evolving/Moderate complexity    Clinical Decision Making Moderate    Rehab Potential Good    PT Frequency 2x / week    PT Duration 8 weeks    PT Treatment/Interventions Electrical Stimulation;Moist Heat;Traction;Ultrasound;Gait training;Therapeutic activities;DME Instruction;Neuromuscular re-education;Manual techniques;Spinal Manipulations;Joint Manipulations;Passive range of motion;Dry needling;Patient/family education;Balance training;Functional mobility training;Therapeutic exercise;Cryotherapy;ADLs/Self Care Home Management;Stair training;Energy conservation    PT Next Visit Plan increased ext strengthening/mobility, gait training    PT Home Exercise Plan lying prone, STS, modified thomas stretch    Consulted and Agree with Plan of Care Patient           Patient will benefit from skilled therapeutic intervention in order to improve the following deficits and impairments:  Cardiopulmonary status limiting activity,Decreased balance,Decreased coordination,Decreased mobility,Difficulty walking,Impaired flexibility,Impaired tone,Postural dysfunction,Abnormal gait,Decreased activity tolerance,Decreased endurance,Decreased range of motion,Decreased strength,Increased fascial  restricitons,Hypomobility,Pain,Improper body mechanics  Visit Diagnosis: Chronic right-sided low back pain without sciatica     Problem List Patient Active Problem List   Diagnosis Date Noted  . Hyperlipidemia 05/05/2020  . Hypertension 05/05/2020  . OP (osteoporosis) 05/05/2020  . Pain due to onychomycosis of toenails of both feet 05/05/2020  . Spondylosis without myelopathy or radiculopathy, lumbar region 11/06/2019  . 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 Chronic use of opiate for therapeutic purpose 12/14/2018  . Chronic pain syndrome 12/14/2018  . Thoracic radiculopathy 12/14/2018  . 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 Depression, prolonged 04/07/2018  . Acute on chronic respiratory failure with hypoxia ([place])   . Palliative care encounter   . Protein-calorie malnutrition, severe 02/06/2018  . Acute respiratory failure ([place]) 02/04/2018  . COPD (chronic obstructive pulmonary disease) ([place]) 03/22/2017  . Post herpetic neuralgia 07/30/2016  . Hypercalcemia 02/17/2016  . Chronic angle-closure glaucoma of both eyes, severe stage 01/20/2016  . Age-related nuclear cataract of both eyes 12/17/2015  . Pneumonia 11/17/2015  . Long term current use of systemic steroids 02/14/2015  . Pulmonary hypertension ([place]) 10/05/2013  . 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 Abnormal Pap smear of vagina 05/31/2012  . 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 Ovarian cancer ([place]) 12/11/2009  . Pulmonary sarcoidosis ([place]) 10/11/1999   Durwin Reges DPT Turner Daniels, SPT  Durwin Reges 05/20/2020, 1:28 PM  [facility] [address] [place], [address] Phone: [phone]   Fax:  [phone]  Name: Autumn [name] MRN: [id number] Date of Birth: [birth year]

## 2020-05-21 ENCOUNTER — Other Ambulatory Visit: Payer: Self-pay | Admitting: Physical Medicine & Rehabilitation

## 2020-05-21 DIAGNOSIS — M5416 Radiculopathy, lumbar region: Secondary | ICD-10-CM

## 2020-05-22 ENCOUNTER — Encounter: Payer: Self-pay | Admitting: Physical Therapy

## 2020-05-22 ENCOUNTER — Other Ambulatory Visit: Payer: Self-pay

## 2020-05-22 ENCOUNTER — Ambulatory Visit: Payer: Medicare PPO | Admitting: Physical Therapy

## 2020-05-22 DIAGNOSIS — M545 Low back pain, unspecified: Secondary | ICD-10-CM | POA: Diagnosis not present

## 2020-05-22 DIAGNOSIS — G8929 Other chronic pain: Secondary | ICD-10-CM

## 2020-05-22 NOTE — Therapy (Signed)
[facility] [address], [place], [address] Phone: [phone]   Fax:  [phone]  Physical Therapy Treatment  Patient Details  Name: Autumn [name] MRN: [id number] Date of Birth: 1945/09/11 No data recorded  Encounter Date: 05/22/2020   PT End of Session - 05/22/20 1041    Visit Number 16    Number of Visits 25    Date for PT Re-Evaluation 06/13/20    Authorization - Visit Number 5    Authorization - Number of Visits 10    PT Start Time 5573    PT Stop Time 1110    PT Time Calculation (min) 39 min    Equipment Utilized During Treatment Gait belt    Activity Tolerance Patient tolerated treatment well    Behavior During Therapy WFL for tasks assessed/performed           Past Medical History:  Diagnosis Date  . Allergy   . Anemia   . Arthritis   . COPD (chronic obstructive pulmonary disease) ([place])   . Glaucoma   . H/O uvulectomy   . Hypertension   . Osteoporosis   . Ovarian cancer ([place])   . Postherpetic neuralgia   . Pulmonary sarcoidosis ([place])   . Sarcoid     Past Surgical History:  Procedure Laterality Date  . ABDOMINAL HYSTERECTOMY    . BREAST EXCISIONAL BIOPSY Right    benign  . COLONOSCOPY WITH PROPOFOL N/A 07/30/2019   Procedure: COLONOSCOPY WITH PROPOFOL;  Surgeon: Toledo, Benay Pike, MD;  Location: [facility]COPY;  Service: Gastroenterology;  Laterality: N/A;  . DIAGNOSTIC LAPAROSCOPY    . DILATION AND CURETTAGE OF UTERUS    . EYE SURGERY    . VULVAR LESION REMOVAL      There were no vitals filed for this visit.   Subjective Assessment - 05/22/20 1036    Subjective Reports her usual back pain intensity. Recently went to MD about her back with talks of taking an MRI and possible injection to try to help alleviate back pain.    Pertinent History Pt is a 75 year old female presenting with acute on chronic LBP with insideous onset a year ago. Reports she finds it difficult to walk  upright d/t back pain, and can only walk 65mins. Pain is increased by all movements but she especially notices it when she is cooking and completing household tasks. Patient cannot see out of her R eye and has glaucoma in the L so her husband drives her to appointments and to church on Sundays. She has ramp at church, and only 2 steps to enter/exit her one story home that she is able to do without assistance with rail. Pt uses rollator in the community and occassional SPC in home. One chronic 2L O2 that she increases to 3 if she feels short of breath. Denies any falls in the past 6 months. LBP is across low back, denies radiating symptoms or numbness/tingling. Reports pain is 8/10 at its worst, and 0/10 at it's best when she has her pain medication. Currently 7/10. Pt denies N/V, B&B changes, unexplained weight fluctuation, saddle paresthesia, fever, night sweats, or unrelenting night pain at this time.    Limitations Walking    How long can you sit comfortably? unlimited "feels good"    How long can you stand comfortably? 91mins    How long can you walk comfortably? 39mins    Patient Stated Goals stand up tall and walk faster,  without walker    Pain Onset More than a month ago           Ther-Ex Nustep seat 11 BUE 12 L4 12mins with min cuing to keep SPM over 60; O2 monitored   Sit to Stand From standard seat with 4# DB with both UEs x10 with full sit; 3x8; with focus on fast stand and "barely touching chair like there is a sharp tac on it" for power emphasis with good carry over     Gait Training    2 x 383ft using SPC with trying to sustain fast and safe speed for endurance; with supervision using gait belt   300 ft using rollator with call outs for speed changes and directional changes; with supervision                            PT Education - 05/22/20 1039    Education Details therex form/technique, gait training    Person(s) Educated Patient    Methods  Explanation;Demonstration;Verbal cues    Comprehension Verbalized understanding;Returned demonstration;Verbal cues required            PT Short Term Goals - 03/25/20 1410      PT SHORT TERM GOAL #1   Title Pt will be independent with HEP in order to improve strength and decrease back pain in order to improve pain-free function at home and work.    Baseline 03/25/20 HEP given    Time 4    Period Weeks    Status New             PT Long Term Goals - 05/06/20 1154      PT LONG TERM GOAL #1   Title Pt will decrease worst back pain as reported on NPRS by at least 2 points in order to demonstrate clinically significant reduction in back pain.    Baseline 03/25/20 8/10; 05/06/20 7/10    Time 8    Period Weeks    Status On-going      PT LONG TERM GOAL #2   Title Pt will increase 10MWT to at least 1.65m/s in order to demonstrate safe and independent community speed and iADLs    Baseline 03/25/20 with rollator 0.8m/s; 05/06/20 with rollator 0.5m/s    Time 8    Period Weeks    Status Revised      PT LONG TERM GOAL #3   Title pt will decrease 5TSTS by at least 3 seconds in order to demonstrate age matched norms in BLE strength needed for ind transfers.    Baseline 03/25/20 21.3sec; 05/06/20 15sec    Time 8    Period Weeks    Status Revised      PT LONG TERM GOAL #4   Title Patient will increase FOTO score to 52 to demonstrate predicted increase in functional mobility to complete ADLs    Baseline 03/28/20 37; 05/06/20 57    Time 8    Period Weeks    Status Achieved      PT LONG TERM GOAL #5   Title Pt will increase 6MWT by at least 69m (14ft) in order to demonstrate clinically significant improvement in cardiopulmonary endurance and community ambulation    Baseline 03/28/20 452ft; 05/06/20 581 ft    Time 8    Period Weeks    Status On-going                 Plan - 05/22/20 1045  Clinical Impression Statement Continued therex progression for increased BLE and core  strength for carry over into independent transfers and gait training for community ambulation. Pt was able to perform proper technique following demonstration and min cuing with no increases in pain. Pt was able to tolerate sustained endurance using SPC while maintaining normal O2 sats. PT will continue to progress as able.    Personal Factors and Comorbidities Age;Comorbidity 1;Comorbidity 2;Comorbidity 3+;Past/Current Experience;Sex;Fitness;Time since onset of injury/illness/exacerbation    Comorbidities HTN, osteoporosis, COPD    Examination-Activity Limitations Lift;Stand;Transfers;Squat;Stairs;Locomotion Level    Examination-Participation Restrictions Community Activity;Laundry;Cleaning    Stability/Clinical Decision Making Evolving/Moderate complexity    Clinical Decision Making Moderate    Rehab Potential Good    PT Frequency 2x / week    PT Duration 8 weeks    PT Treatment/Interventions Electrical Stimulation;Moist Heat;Traction;Ultrasound;Gait training;Therapeutic activities;DME Instruction;Neuromuscular re-education;Manual techniques;Spinal Manipulations;Joint Manipulations;Passive range of motion;Dry needling;Patient/family education;Balance training;Functional mobility training;Therapeutic exercise;Cryotherapy;ADLs/Self Care Home Management;Stair training;Energy conservation    PT Next Visit Plan increased ext strengthening/mobility, gait training    PT Home Exercise Plan lying prone, STS, modified thomas stretch    Consulted and Agree with Plan of Care Patient           Patient will benefit from skilled therapeutic intervention in order to improve the following deficits and impairments:  Cardiopulmonary status limiting activity,Decreased balance,Decreased coordination,Decreased mobility,Difficulty walking,Impaired flexibility,Impaired tone,Postural dysfunction,Abnormal gait,Decreased activity tolerance,Decreased endurance,Decreased range of motion,Decreased strength,Increased fascial  restricitons,Hypomobility,Pain,Improper body mechanics  Visit Diagnosis: Chronic bilateral low back pain without sciatica     Problem List Patient Active Problem List   Diagnosis Date Noted  . Hyperlipidemia 05/05/2020  . Hypertension 05/05/2020  . OP (osteoporosis) 05/05/2020  . Pain due to onychomycosis of toenails of both feet 05/05/2020  . Spondylosis without myelopathy or radiculopathy, lumbar region 11/06/2019  . 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 Chronic use of opiate for therapeutic purpose 12/14/2018  . Chronic pain syndrome 12/14/2018  . Thoracic radiculopathy 12/14/2018  . 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 Depression, prolonged 04/07/2018  . Acute on chronic respiratory failure with hypoxia ([place])   . Palliative care encounter   . Protein-calorie malnutrition, severe 02/06/2018  . Acute respiratory failure ([place]) 02/04/2018  . COPD (chronic obstructive pulmonary disease) ([place]) 03/22/2017  . Post herpetic neuralgia 07/30/2016  . Hypercalcemia 02/17/2016  . Chronic angle-closure glaucoma of both eyes, severe stage 01/20/2016  . Age-related nuclear cataract of both eyes 12/17/2015  . Pneumonia 11/17/2015  . Long term current use of systemic steroids 02/14/2015  . Pulmonary hypertension ([place]) 10/05/2013  . 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 Abnormal Pap smear of vagina 05/31/2012  . 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 Ovarian cancer ([place]) 12/11/2009  . Pulmonary sarcoidosis ([place]) 10/11/1999    Durwin Reges DPT Turner Daniels, SPT  Durwin Reges 05/22/2020, 1:53 PM  [facility] [place] [facility] [address] [place], [address] Phone: [phone]   Fax:  [phone]  Name: Autumn [name] MRN: [id number] Date of Birth: 04-07-1946

## 2020-05-28 ENCOUNTER — Other Ambulatory Visit: Payer: Self-pay

## 2020-05-28 ENCOUNTER — Encounter: Payer: Self-pay | Admitting: Physical Therapy

## 2020-05-28 ENCOUNTER — Ambulatory Visit: Payer: Medicare PPO | Admitting: Physical Therapy

## 2020-05-28 DIAGNOSIS — M545 Low back pain, unspecified: Secondary | ICD-10-CM | POA: Diagnosis not present

## 2020-05-28 DIAGNOSIS — G8929 Other chronic pain: Secondary | ICD-10-CM

## 2020-05-28 NOTE — Therapy (Signed)
[facility] [address] [place], [address] Phone: [phone]   Fax:  [phone]  Physical Therapy Treatment  Patient Details  Name: Autumn [name] MRN: [id number] Date of Birth: [birth year] No data recorded  Encounter Date: 05/28/2020   PT End of Session - 05/28/20 1627    Visit Number 17    Number of Visits 25    Date for PT Re-Evaluation 06/13/20    Authorization - Visit Number 6    PT Start Time 2536    PT Stop Time 1655    PT Time Calculation (min) 38 min    Equipment Utilized During Treatment Gait belt    Activity Tolerance Patient tolerated treatment well    Behavior During Therapy WFL for tasks assessed/performed           Past Medical History:  Diagnosis Date  . Allergy   . Anemia   . Arthritis   . COPD (chronic obstructive pulmonary disease) ([place])   . Glaucoma   . H/O uvulectomy   . Hypertension   . Osteoporosis   . Ovarian cancer ([place])   . Postherpetic neuralgia   . Pulmonary sarcoidosis ([place])   . Sarcoid     Past Surgical History:  Procedure Laterality Date  . ABDOMINAL HYSTERECTOMY    . BREAST EXCISIONAL BIOPSY Right    benign  . COLONOSCOPY WITH PROPOFOL N/A 07/30/2019   Procedure: COLONOSCOPY WITH PROPOFOL;  Surgeon: Toledo, Benay Pike, MD;  Location: [facility]COPY;  Service: Gastroenterology;  Laterality: N/A;  . DIAGNOSTIC LAPAROSCOPY    . DILATION AND CURETTAGE OF UTERUS    . EYE SURGERY    . VULVAR LESION REMOVAL      There were no vitals filed for this visit.   Subjective Assessment - 05/28/20 1623    Subjective Reports she went to sit on a stool the other day, and missed it, falling onto her bottom. She is sore, no bruising.    Pertinent History Pt is a 75 year old female presenting with acute on chronic LBP with insideous onset a year ago. Reports she finds it difficult to walk upright d/t back pain, and can only walk 1mins. Pain is increased by all movements  but she especially notices it when she is cooking and completing household tasks. Patient cannot see out of her R eye and has glaucoma in the L so her husband drives her to appointments and to church on Sundays. She has ramp at church, and only 2 steps to enter/exit her one story home that she is able to do without assistance with rail. Pt uses rollator in the community and occassional SPC in home. One chronic 2L O2 that she increases to 3 if she feels short of breath. Denies any falls in the past 6 months. LBP is across low back, denies radiating symptoms or numbness/tingling. Reports pain is 8/10 at its worst, and 0/10 at it's best when she has her pain medication. Currently 7/10. Pt denies N/V, B&B changes, unexplained weight fluctuation, saddle paresthesia, fever, night sweats, or unrelenting night pain at this time.    Limitations Walking    How long can you sit comfortably? unlimited "feels good"    How long can you stand comfortably? 33mins    How long can you walk comfortably? 45mins    Patient Stated Goals stand up tall and walk faster, without walker    Pain Onset More than a month ago  Ther-Ex Nustep seat 11 BUE12 L4 36minswith min cuing to keep SPM over 60; O2 monitored  Squat with chair behing 3x 10; cuing to attempt to get close to chair, unable, but with good carry over of cuing for full stand/full hip ext   sidesteeping over 6in hurdle 2x 12  Gait Training  168ft using SPC in LUE to attempt to encourage left leaning/trunk shift, utilimately unable. 121ft with SPC in RLE with cuing for step through gait and to extend elbow when pushing through [facility] for increased upright posture; 280ft with speed changes called out at random; with supervision using gait belt  300 ft using rollator with call outs for speed changes and directional changes; with supervision                            PT Education - 05/28/20 1627    Education Details therex  form/technique, gait training    Person(s) Educated Patient    Methods Explanation;Demonstration;Verbal cues    Comprehension Verbalized understanding;Returned demonstration;Verbal cues required            PT Short Term Goals - 03/25/20 1410      PT SHORT TERM GOAL #1   Title Pt will be independent with HEP in order to improve strength and decrease back pain in order to improve pain-free function at home and work.    Baseline 03/25/20 HEP given    Time 4    Period Weeks    Status New             PT Long Term Goals - 05/06/20 1154      PT LONG TERM GOAL #1   Title Pt will decrease worst back pain as reported on NPRS by at least 2 points in order to demonstrate clinically significant reduction in back pain.    Baseline 03/25/20 8/10; 05/06/20 7/10    Time 8    Period Weeks    Status On-going      PT LONG TERM GOAL #2   Title Pt will increase 10MWT to at least 1.4m/s in order to demonstrate safe and independent community speed and iADLs    Baseline 03/25/20 with rollator 0.46m/s; 05/06/20 with rollator 0.70m/s    Time 8    Period Weeks    Status Revised      PT LONG TERM GOAL #3   Title pt will decrease 5TSTS by at least 3 seconds in order to demonstrate 75 matched norms in BLE strength needed for ind transfers.    Baseline 03/25/20 21.3sec; 05/06/20 15sec    Time 8    Period Weeks    Status Revised      PT LONG TERM GOAL #4   Title Patient will increase FOTO score to 52 to demonstrate predicted increase in functional mobility to complete ADLs    Baseline 03/28/20 37; 05/06/20 57    Time 8    Period Weeks    Status Achieved      PT LONG TERM GOAL #5   Title Pt will increase 6MWT by at least 69m (135ft) in order to demonstrate clinically significant improvement in cardiopulmonary endurance and community ambulation    Baseline 03/28/20 480ft; 05/06/20 581 ft    Time 8    Period Weeks    Status On-going                 Plan - 05/28/20 1651    Clinical  Impression Statement  Continued therex progression for increased BLE and core strength and safety with ambulation with success. Patient is able to comply with all cuing for proper technique of therex with good motivation throughout session. PT attempted [facility] placement in LUE with difficulty, cued patient in "pushing off" SPC with stepping to increase upright posture with good carry over. PT will continue progression as able.    Personal Factors and Comorbidities Age;Comorbidity 1;Comorbidity 2;Comorbidity 3+;Past/Current Experience;Sex;Fitness;Time since onset of injury/illness/exacerbation    Comorbidities HTN, osteoporosis, COPD    Examination-Activity Limitations Lift;Stand;Transfers;Squat;Stairs;Locomotion Level    Examination-Participation Restrictions Community Activity;Laundry;Cleaning    Stability/Clinical Decision Making Evolving/Moderate complexity    Clinical Decision Making Moderate    Rehab Potential Good    PT Frequency 2x / week    PT Duration 8 weeks    PT Treatment/Interventions Electrical Stimulation;Moist Heat;Traction;Ultrasound;Gait training;Therapeutic activities;DME Instruction;Neuromuscular re-education;Manual techniques;Spinal Manipulations;Joint Manipulations;Passive range of motion;Dry needling;Patient/family education;Balance training;Functional mobility training;Therapeutic exercise;Cryotherapy;ADLs/Self Care Home Management;Stair training;Energy conservation    PT Next Visit Plan increased ext strengthening/mobility, gait training    PT Home Exercise Plan lying prone, STS, modified thomas stretch           Patient will benefit from skilled therapeutic intervention in order to improve the following deficits and impairments:  Cardiopulmonary status limiting activity,Decreased balance,Decreased coordination,Decreased mobility,Difficulty walking,Impaired flexibility,Impaired tone,Postural dysfunction,Abnormal gait,Decreased activity tolerance,Decreased endurance,Decreased  range of motion,Decreased strength,Increased fascial restricitons,Hypomobility,Pain,Improper body mechanics  Visit Diagnosis: Chronic bilateral low back pain without sciatica  Chronic right-sided low back pain without sciatica  Chronic midline low back pain without sciatica     Problem List Patient Active Problem List   Diagnosis Date Noted  . Hyperlipidemia 05/05/2020  . Hypertension 05/05/2020  . OP (osteoporosis) 05/05/2020  . Pain due to onychomycosis of toenails of both feet 05/05/2020  . Spondylosis without myelopathy or radiculopathy, lumbar region 11/06/2019  . 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 Chronic use of opiate for therapeutic purpose 12/14/2018  . Chronic pain syndrome 12/14/2018  . Thoracic radiculopathy 12/14/2018  . 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 Depression, prolonged 04/07/2018  . Acute on chronic respiratory failure with hypoxia ([place])   . Palliative care encounter   . Protein-calorie malnutrition, severe 02/06/2018  . Acute respiratory failure ([place]) 02/04/2018  . COPD (chronic obstructive pulmonary disease) ([place]) 03/22/2017  . Post herpetic neuralgia 07/30/2016  . Hypercalcemia 02/17/2016  . Chronic angle-closure glaucoma of both eyes, severe stage 01/20/2016  . Age-related nuclear cataract of both eyes 12/17/2015  . Pneumonia 11/17/2015  . Long term current use of systemic steroids 02/14/2015  . Pulmonary hypertension ([place]) 10/05/2013  . 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 Abnormal Pap smear of vagina 05/31/2012  . 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 Ovarian cancer ([place]) 12/11/2009  . Pulmonary sarcoidosis ([place]) 10/11/1999   Durwin Reges DPT Durwin Reges 05/28/2020, 5:02 PM  [facility] [address], [place], [address] Phone: [phone]   Fax:  [phone]  Name: KAMREN HESKETT MRN:  [id number] Date of Birth: 07-Jan-1946

## 2020-05-29 ENCOUNTER — Encounter: Payer: Medicare PPO | Admitting: Physical Therapy

## 2020-05-30 ENCOUNTER — Other Ambulatory Visit: Payer: Self-pay

## 2020-05-30 ENCOUNTER — Ambulatory Visit
Admission: RE | Admit: 2020-05-30 | Discharge: 2020-05-30 | Disposition: A | Discharge status: Home / Self Care | Payer: Medicare PPO | Source: Ambulatory Visit | Attending: Physical Medicine & Rehabilitation | Admitting: Physical Medicine & Rehabilitation

## 2020-05-30 DIAGNOSIS — M5416 Radiculopathy, lumbar region: Secondary | ICD-10-CM | POA: Diagnosis present

## 2020-06-03 ENCOUNTER — Encounter: Payer: Self-pay | Admitting: Physical Therapy

## 2020-06-03 ENCOUNTER — Other Ambulatory Visit: Payer: Self-pay

## 2020-06-03 ENCOUNTER — Ambulatory Visit: Payer: Medicare PPO | Admitting: Physical Therapy

## 2020-06-03 ENCOUNTER — Encounter: Payer: Medicare PPO | Admitting: Physical Therapy

## 2020-06-03 DIAGNOSIS — M545 Low back pain, unspecified: Secondary | ICD-10-CM

## 2020-06-03 DIAGNOSIS — G8929 Other chronic pain: Secondary | ICD-10-CM

## 2020-06-03 DIAGNOSIS — R2689 Other abnormalities of gait and mobility: Secondary | ICD-10-CM

## 2020-06-03 NOTE — Therapy (Signed)
[facility] [address] [place], [address] Phone: [phone]   Fax:  [phone]  Physical Therapy Treatment  Patient Details  Name: Autumn [name] MRN: [id number] Date of Birth: [birth year] No data recorded  Encounter Date: 06/03/2020   PT End of Session - 06/03/20 1127    Visit Number 18    Number of Visits 25    Date for PT Re-Evaluation 06/13/20    Authorization - Visit Number 7    Authorization - Number of Visits 10    PT Start Time 1115    PT Stop Time 1155    PT Time Calculation (min) 40 min    Equipment Utilized During Treatment Gait belt    Activity Tolerance Patient tolerated treatment well    Behavior During Therapy Autumn [name] for tasks assessed/performed           Past Medical History:  Diagnosis Date  . Allergy   . Anemia   . Arthritis   . COPD (chronic obstructive pulmonary disease) ([place])   . Glaucoma   . H/O uvulectomy   . Hypertension   . Osteoporosis   . Ovarian cancer ([place])   . Postherpetic neuralgia   . Pulmonary sarcoidosis ([place])   . Sarcoid     Past Surgical History:  Procedure Laterality Date  . ABDOMINAL HYSTERECTOMY    . BREAST EXCISIONAL BIOPSY Right    benign  . COLONOSCOPY WITH PROPOFOL N/A 07/30/2019   Procedure: COLONOSCOPY WITH PROPOFOL;  Surgeon: Toledo, Benay Pike, MD;  Location: [facility] ENDOSCOPY;  Service: Gastroenterology;  Laterality: N/A;  . DIAGNOSTIC LAPAROSCOPY    . DILATION AND CURETTAGE OF UTERUS    . EYE SURGERY    . VULVAR LESION REMOVAL      There were no vitals filed for this visit.   Subjective Assessment - 06/03/20 1119    Subjective Reports her pain is 7/10 today. Reports she didn't do her HEP over the weekend d/t pain.    Pertinent History Pt is a 75 year old female presenting with acute on chronic LBP with insideous onset a year ago. Reports she finds it difficult to walk upright d/t back pain, and can only walk 79mins. Pain is increased by  all movements but she especially notices it when she is cooking and completing household tasks. Patient cannot see out of her R eye and has glaucoma in the L so her husband drives her to appointments and to church on Sundays. She has ramp at church, and only 2 steps to enter/exit her one story home that she is able to do without assistance with rail. Pt uses rollator in the community and occassional SPC in home. One chronic 2L O2 that she increases to 3 if she feels short of breath. Denies any falls in the past 6 months. LBP is across low back, denies radiating symptoms or numbness/tingling. Reports pain is 8/10 at its worst, and 0/10 at it's best when she has her pain medication. Currently 7/10. Pt denies N/V, B&B changes, unexplained weight fluctuation, saddle paresthesia, fever, night sweats, or unrelenting night pain at this time.    Limitations Walking    How long can you sit comfortably? unlimited "feels good"    How long can you stand comfortably? 7mins    How long can you walk comfortably? 39mins    Patient Stated Goals stand up tall and walk faster, without walker    Pain Onset More than a month ago  Ther-Ex Nustep seat 11 BUE12 L4 66minswith min cuing to keep SPM over 60; O2 monitored  Squat to elevated mat table with 10# DB 3x 5  Gait Training  36ft outside with SPC over ramp 10% grade, step decline 5 steps stept- with SPC and rail, walking on grass/uneven ground, and turns. Cuing throughout for increased step length with some carry over, vision playing a part in difficulty. CGA for stairs and uneven surfaces  Reciprocal 6in hurdle negotiation for increased foot clearance with supervision for safety with SPC  26ft with SPC with 10x hurdle negotiation with encouragement to not change speed to accomplish with good carry over.         PT Education - 06/03/20 1122    Education Details therex form/technique, gait training    Person(s) Educated Patient     Methods Explanation;Demonstration;Verbal cues    Comprehension Verbalized understanding;Returned demonstration;Verbal cues required            PT Short Term Goals - 03/25/20 1410      PT SHORT TERM GOAL #1   Title Pt will be independent with HEP in order to improve strength and decrease back pain in order to improve pain-free function at home and work.    Baseline 03/25/20 HEP given    Time 4    Period Weeks    Status New             PT Long Term Goals - 05/06/20 1154      PT LONG TERM GOAL #1   Title Pt will decrease worst back pain as reported on NPRS by at least 2 points in order to demonstrate clinically significant reduction in back pain.    Baseline 03/25/20 8/10; 05/06/20 7/10    Time 8    Period Weeks    Status On-going      PT LONG TERM GOAL #2   Title Pt will increase 10MWT to at least 1.39m/s in order to demonstrate safe and independent community speed and iADLs    Baseline 03/25/20 with rollator 0.4m/s; 05/06/20 with rollator 0.54m/s    Time 8    Period Weeks    Status Revised      PT LONG TERM GOAL #3   Title pt will decrease 5TSTS by at least 3 seconds in order to demonstrate age matched norms in BLE strength needed for ind transfers.    Baseline 03/25/20 21.3sec; 05/06/20 15sec    Time 8    Period Weeks    Status Revised      PT LONG TERM GOAL #4   Title Patient will increase FOTO score to 52 to demonstrate predicted increase in functional mobility to complete ADLs    Baseline 03/28/20 37; 05/06/20 57    Time 8    Period Weeks    Status Achieved      PT LONG TERM GOAL #5   Title Pt will increase 6MWT by at least 24m (15ft) in order to demonstrate clinically significant improvement in cardiopulmonary endurance and community ambulation    Baseline 03/28/20 410ft; 05/06/20 581 ft    Time 8    Period Weeks    Status On-going                 Plan - 06/03/20 1351    Clinical Impression Statement PT continued therex progression for increased ind  with ambulation with SPC, and therex for increased BLE core strength with success. Patient demonstrates safety with dynamic gait, over even and uneven  surfaces, stairs, and around turns with CGA for safety, some difficulty d/t vision. Pt with consistent cuing for increased foot clearance, which she demonstrates better carry over with following hurdle exercise. PT will reassess for d/c readiness next session    Personal Factors and Comorbidities Age;Comorbidity 1;Comorbidity 2;Comorbidity 3+;Past/Current Experience;Sex;Fitness;Time since onset of injury/illness/exacerbation    Comorbidities HTN, osteoporosis, COPD    Examination-Activity Limitations Lift;Stand;Transfers;Squat;Stairs;Locomotion Level    Examination-Participation Restrictions Community Activity;Laundry;Cleaning    Stability/Clinical Decision Making Evolving/Moderate complexity    Clinical Decision Making Moderate    Rehab Potential Good    PT Frequency 2x / week    PT Duration 8 weeks    PT Treatment/Interventions Electrical Stimulation;Moist Heat;Traction;Ultrasound;Gait training;Therapeutic activities;DME Instruction;Neuromuscular re-education;Manual techniques;Spinal Manipulations;Joint Manipulations;Passive range of motion;Dry needling;Patient/family education;Balance training;Functional mobility training;Therapeutic exercise;Cryotherapy;ADLs/Self Care Home Management;Stair training;Energy conservation    PT Next Visit Plan increased ext strengthening/mobility, gait training    PT Home Exercise Plan lying prone, STS, modified thomas stretch    Consulted and Agree with Plan of Care Patient           Patient will benefit from skilled therapeutic intervention in order to improve the following deficits and impairments:  Cardiopulmonary status limiting activity,Decreased balance,Decreased coordination,Decreased mobility,Difficulty walking,Impaired flexibility,Impaired tone,Postural dysfunction,Abnormal gait,Decreased activity  tolerance,Decreased endurance,Decreased range of motion,Decreased strength,Increased fascial restricitons,Hypomobility,Pain,Improper body mechanics  Visit Diagnosis: Chronic bilateral low back pain without sciatica  Chronic right-sided low back pain without sciatica  Chronic midline low back pain without sciatica  Other abnormalities of gait and mobility     Problem List Patient Active Problem List   Diagnosis Date Noted  . Hyperlipidemia 05/05/2020  . Hypertension 05/05/2020  . OP (osteoporosis) 05/05/2020  . Pain due to onychomycosis of toenails of both feet 05/05/2020  . Spondylosis without myelopathy or radiculopathy, lumbar region 11/06/2019  . 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 Chronic use of opiate for therapeutic purpose 12/14/2018  . Chronic pain syndrome 12/14/2018  . Thoracic radiculopathy 12/14/2018  . 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 Depression, prolonged 04/07/2018  . Acute on chronic respiratory failure with hypoxia ([place])   . Palliative care encounter   . Protein-calorie malnutrition, severe 02/06/2018  . Acute respiratory failure ([place]) 02/04/2018  . COPD (chronic obstructive pulmonary disease) ([place]) 03/22/2017  . Post herpetic neuralgia 07/30/2016  . Hypercalcemia 02/17/2016  . Chronic angle-closure glaucoma of both eyes, severe stage 01/20/2016  . Age-related nuclear cataract of both eyes 12/17/2015  . Pneumonia 11/17/2015  . Long term current use of systemic steroids 02/14/2015  . Pulmonary hypertension ([place]) 10/05/2013  . 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 Abnormal Pap smear of vagina 05/31/2012  . 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 Ovarian cancer ([place]) 12/11/2009  . Pulmonary sarcoidosis ([place]) 10/11/1999   Autumn [name] DPT Autumn [name] 06/03/2020, 2:39 PM  [facility] [address], [place],  [address] Phone: [phone]   Fax:  [phone]  Name: Autumn [name] MRN: [id number] Date of Birth: [birth year]

## 2020-06-05 ENCOUNTER — Other Ambulatory Visit: Payer: Self-pay

## 2020-06-05 ENCOUNTER — Ambulatory Visit: Payer: Medicare PPO | Admitting: Physical Therapy

## 2020-06-05 ENCOUNTER — Encounter: Payer: Self-pay | Admitting: Physical Therapy

## 2020-06-05 DIAGNOSIS — R2689 Other abnormalities of gait and mobility: Secondary | ICD-10-CM

## 2020-06-05 DIAGNOSIS — M545 Low back pain, unspecified: Secondary | ICD-10-CM

## 2020-06-05 DIAGNOSIS — G8929 Other chronic pain: Secondary | ICD-10-CM

## 2020-06-05 NOTE — Therapy (Signed)
[facility] [address], [place], [address] Phone: [phone]   Fax:  [phone]  Physical Therapy Treatment/Discharge Summary  Reporting Period 05/06/20 - 06/05/20  Patient Details  Name: Autumn [name] MRN: [id number] Date of Birth: [birth year] No data recorded  Encounter Date: 06/05/2020   PT End of Session - 06/05/20 1708    Visit Number 19    Number of Visits 25    Date for PT Re-Evaluation 06/13/20    Authorization - Visit Number 8    Authorization - Number of Visits 10    PT Start Time 0440    PT Stop Time 0507    PT Time Calculation (min) 27 min    Equipment Utilized During Treatment Gait belt    Activity Tolerance Patient tolerated treatment well    Behavior During Therapy [facility] for tasks assessed/performed           Past Medical History:  Diagnosis Date  . Allergy   . Anemia   . Arthritis   . COPD (chronic obstructive pulmonary disease) ([place])   . Glaucoma   . H/O uvulectomy   . Hypertension   . Osteoporosis   . Ovarian cancer ([place])   . Postherpetic neuralgia   . Pulmonary sarcoidosis ([place])   . Sarcoid     Past Surgical History:  Procedure Laterality Date  . ABDOMINAL HYSTERECTOMY    . BREAST EXCISIONAL BIOPSY Right    benign  . COLONOSCOPY WITH PROPOFOL N/A 07/30/2019   Procedure: COLONOSCOPY WITH PROPOFOL;  Surgeon: Toledo, Benay Pike, MD;  Location: [facility] ENDOSCOPY;  Service: Gastroenterology;  Laterality: N/A;  . DIAGNOSTIC LAPAROSCOPY    . DILATION AND CURETTAGE OF UTERUS    . EYE SURGERY    . VULVAR LESION REMOVAL      There were no vitals filed for this visit.   Subjective Assessment - 06/05/20 1646    Subjective Patient reports minimal pain, "just enough to be aggravating" rating it 6/10. Feels ready to d/c to HEP, worries about being able to complete exercises independently.    Pertinent History Pt is a 75 year old female presenting with acute on chronic LBP with  insideous onset a year ago. Reports she finds it difficult to walk upright d/t back pain, and can only walk 62mns. Pain is increased by all movements but she especially notices it when she is cooking and completing household tasks. Patient cannot see out of her R eye and has glaucoma in the L so her husband drives her to appointments and to church on Sundays. She has ramp at church, and only 2 steps to enter/exit her one story home that she is able to do without assistance with rail. Pt uses rollator in the community and occassional SPC in home. One chronic 2L O2 that she increases to 3 if she feels short of breath. Denies any falls in the past 6 months. LBP is across low back, denies radiating symptoms or numbness/tingling. Reports pain is 8/10 at its worst, and 0/10 at it's best when she has her pain medication. Currently 7/10. Pt denies N/V, B&B changes, unexplained weight fluctuation, saddle paresthesia, fever, night sweats, or unrelenting night pain at this time.    Limitations Walking    How long can you sit comfortably? unlimited "feels good"    How long can you stand comfortably? 332ms    How long can you walk comfortably? 2028m    Patient Stated Goals stand up  tall and walk faster, without walker    Pain Onset More than a month ago           Ther-Ex Nustep seat 11 BUE12 L4 53mnswith min cuing to keep SPM over 60; O2 monitored  PT reviewed the following HEP with patient with patient able to demonstrate a set of the following with min cuing for correction needed. PT educated patient on parameters of therex (how/when to inc/decrease intensity, frequency, rep/set range, stretch hold time, and purpose of therex) with verbalized understanding.  Access Code: [email or web address] Sit to Stand Without Arm Support - 1 x daily - 3 x weekly - 3 sets - 10 reps Standing Hip Extension with Resistance at Ankles and Counter Support - 1 x daily - 3 x weekly - 3 sets - 10 reps Standing Hip Abduction with  Resistance at Ankles and Counter Support - 1 x daily - 3 x weekly - 3 sets - 10 reps Seated Thoracic Lumbar Extension with Pectoralis Stretch - 2-3 x daily - 7 x weekly - 12 reps - 2-3sec hold                            PT Education - 06/05/20 1708    Education Details therex form/technique, HEP/dc recommendations    Person(s) Educated Patient    Methods Explanation;Demonstration;Verbal cues    Comprehension Verbalized understanding;Returned demonstration;Verbal cues required            PT Short Term Goals - 06/05/20 1647      PT SHORT TERM GOAL #1   Title Pt will be independent with HEP in order to improve strength and decrease back pain in order to improve pain-free function at home and work.    Baseline 03/25/20 HEP given 06/05/20 completing    Time 4    Period Weeks    Status Achieved             PT Long Term Goals - 06/05/20 1647      PT LONG TERM GOAL #1   Title Pt will decrease worst back pain as reported on NPRS by at least 2 points in order to demonstrate clinically significant reduction in back pain.    Baseline 03/25/20 8/10; 05/06/20 7/10; 06/05/20 6/10    Time 8    Period Weeks    Status Achieved      PT LONG TERM GOAL #2   Title Pt will increase 10MWT to at least 1.226m in order to demonstrate safe and independent community speed and iADLs    Baseline 03/25/20 with rollator 0.4276m 05/06/20 with rollator 0.79m/24m2/24/22 1.om/s    Time 8    Period Weeks    Status Partially Met      PT LONG TERM GOAL #3   Title pt will decrease 5TSTS by at least 3 seconds in order to demonstrate age matched norms in BLE strength needed for ind transfers.    Baseline 03/25/20 21.3sec; 05/06/20 15sec    Time 8    Period Weeks    Status Achieved      PT LONG TERM GOAL #4   Title Patient will increase FOTO score to 52 to demonstrate predicted increase in functional mobility to complete ADLs    Baseline 03/28/20 37; 05/06/20 57    Time 8    Period Weeks     Status Achieved      PT LONG TERM GOAL #5   Title Pt will increase 6MWT  by at least 60m(1682f in order to demonstrate clinically significant improvement in cardiopulmonary endurance and community ambulation    Baseline 03/28/20 43150f1/25/22 581 ft; 06/05/20 [id number] Time 8    Period Weeks    Status Achieved                 Plan - 06/05/20 1709    Clinical Impression Statement PT reassessed remaining goals this session where patient has met all goals to safely d/c HEP. Patinet is able to demonstrate HEP and verbalize undersatnding of walking 5-7x/week recommendations. Pt given clinic contact info should any further questions or concerns arise. Pt to d/c PT.    Personal Factors and Comorbidities Age;Comorbidity 1;Comorbidity 2;Comorbidity 3+;Past/Current Experience;Sex;Fitness;Time since onset of injury/illness/exacerbation    Comorbidities HTN, osteoporosis, COPD    Examination-Activity Limitations Lift;Stand;Transfers;Squat;Stairs;Locomotion Level    Examination-Participation Restrictions Community Activity;Laundry;Cleaning    Stability/Clinical Decision Making Evolving/Moderate complexity    Clinical Decision Making Moderate    Rehab Potential Good    PT Duration 8 weeks    PT Treatment/Interventions Electrical Stimulation;Moist Heat;Traction;Ultrasound;Gait training;Therapeutic activities;DME Instruction;Neuromuscular re-education;Manual techniques;Spinal Manipulations;Joint Manipulations;Passive range of motion;Dry needling;Patient/family education;Balance training;Functional mobility training;Therapeutic exercise;Cryotherapy;ADLs/Self Care Home Management;Stair training;Energy conservation    PT Next Visit Plan increased ext strengthening/mobility, gait training    PT Home Exercise Plan lying prone, STS, modified thomas stretch    Consulted and Agree with Plan of Care Patient           Patient will benefit from skilled therapeutic intervention in order to improve the  following deficits and impairments:  Cardiopulmonary status limiting activity,Decreased balance,Decreased coordination,Decreased mobility,Difficulty walking,Impaired flexibility,Impaired tone,Postural dysfunction,Abnormal gait,Decreased activity tolerance,Decreased endurance,Decreased range of motion,Decreased strength,Increased fascial restricitons,Hypomobility,Pain,Improper body mechanics  Visit Diagnosis: Chronic bilateral low back pain without sciatica  Chronic right-sided low back pain without sciatica  Chronic midline low back pain without sciatica  Other abnormalities of gait and mobility     Problem List Patient Active Problem List   Diagnosis Date Noted  . Hyperlipidemia 05/05/2020  . Hypertension 05/05/2020  . OP (osteoporosis) 05/05/2020  . Pain due to onychomycosis of toenails of both feet 05/05/2020  . Spondylosis without myelopathy or radiculopathy, lumbar region 11/06/2019  . 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 Chronic use of opiate for therapeutic purpose 12/14/2018  . Chronic pain syndrome 12/14/2018  . Thoracic radiculopathy 12/14/2018  . 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 Depression, prolonged 04/07/2018  . Acute on chronic respiratory failure with hypoxia (HCC)[place]. Palliative care encounter   . Protein-calorie malnutrition, severe 02/06/2018  . Acute respiratory failure (HCC)[place]/26/2019  . COPD (chronic obstructive pulmonary disease) (HCC)[place]/02/2017  . Post herpetic neuralgia 07/30/2016  . Hypercalcemia 02/17/2016  . Chronic angle-closure glaucoma of both eyes, severe stage 01/20/2016  . Age-related nuclear cataract of both eyes 12/17/2015  . Pneumonia 11/17/2015  . Long term current use of systemic steroids 02/14/2015  . Pulmonary hypertension (HCC)[place]/26/2015  . 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 Abnormal Pap smear of vagina 05/31/2012  . 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 Ovarian  cancer (HCC)[place]/04/2009  . Pulmonary sarcoidosis (HCC)[place]/04/1999   ChelDurwin Reges ChelDurwin Reges4/2022, 5:14 PM  [facility] [address], [id number]: [phone]:  [phone]: HazeCOREAN YOSHIMURA: [phone] of Birth: 10/809-16-1947

## 2020-06-09 ENCOUNTER — Encounter: Payer: Medicare PPO | Admitting: Physical Therapy

## 2020-06-11 ENCOUNTER — Encounter: Payer: Medicare PPO | Admitting: Physical Therapy

## 2020-06-24 ENCOUNTER — Other Ambulatory Visit: Payer: Self-pay

## 2020-06-24 ENCOUNTER — Ambulatory Visit
Payer: Medicare PPO | Attending: Student in an Organized Health Care Education/Training Program | Admitting: Student in an Organized Health Care Education/Training Program

## 2020-06-24 ENCOUNTER — Encounter: Payer: Self-pay | Admitting: Student in an Organized Health Care Education/Training Program

## 2020-06-24 VITALS — BP 154/73 | HR 72 | Temp 97.2°F | Resp 16 | Ht 68.0 in | Wt 131.0 lb

## 2020-06-24 DIAGNOSIS — M792 Neuralgia and neuritis, unspecified: Secondary | ICD-10-CM | POA: Diagnosis present

## 2020-06-24 DIAGNOSIS — M47816 Spondylosis without myelopathy or radiculopathy, lumbar region: Secondary | ICD-10-CM

## 2020-06-24 DIAGNOSIS — M47894 Other spondylosis, thoracic region: Secondary | ICD-10-CM

## 2020-06-24 DIAGNOSIS — B0229 Other postherpetic nervous system involvement: Secondary | ICD-10-CM

## 2020-06-24 DIAGNOSIS — G894 Chronic pain syndrome: Secondary | ICD-10-CM

## 2020-06-24 MED ORDER — HYDROCODONE-ACETAMINOPHEN 10-325 MG PO TABS: 1.0000 | ORAL_TABLET | Freq: Three times a day (TID) | ORAL | 0 refills | Status: DC | PRN

## 2020-06-24 MED ORDER — NALOXEGOL OXALATE 12.5 MG PO TABS: 12.5000 mg | ORAL_TABLET | Freq: Every day | ORAL | 1 refills | Status: AC

## 2020-06-24 MED ORDER — HYDROCODONE-ACETAMINOPHEN 10-325 MG PO TABS: 1.0000 | ORAL_TABLET | Freq: Three times a day (TID) | ORAL | 0 refills | Status: AC | PRN

## 2020-06-24 MED ORDER — PREGABALIN 50 MG PO CAPS: ORAL_CAPSULE | ORAL | 0 refills | Status: DC

## 2020-06-24 NOTE — Progress Notes (Signed)
Safety precautions to be maintained throughout the outpatient stay will include: orient to surroundings, keep bed in low position, maintain call bell within reach at all times, provide assistance with transfer out of bed and ambulation.   Did not bring medication with her today. Took pain medication 06/23/20  Instructed to bring to next appt.

## 2020-06-24 NOTE — Progress Notes (Signed)
PROVIDER NOTE: Information contained herein reflects review and annotations entered in association with encounter. Interpretation of such information and data should be left to medically-trained personnel. Information provided to patient can be located elsewhere in the medical record under "Patient Instructions". Document created using STT-dictation technology, any transcriptional errors that may result from process are unintentional.    Patient: Autumn [name]  Service Category: E/M  Provider: Gillis Santa, MD  DOB: October 07, 1945  DOS: 06/24/2020  Specialty: Interventional Pain Management  MRN: [id number]  Setting: Ambulatory outpatient  PCP: Baxter Hire, MD  Type: Established Patient    Referring Provider: Baxter Hire, MD  Location: Office  Delivery: Face-to-face     HPI  Autumn [name], a 75 y.o. 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 Autumn [name] primary complain today is Back Pain (Mid back on left r/t shingles) Last encounter: My last encounter with her was on 04/01/2020. Pertinent problems: Autumn [name] has COPD (chronic obstructive pulmonary disease) ([place]); Post herpetic neuralgia; Pulmonary sarcoidosis ([place]); Chronic pain syndrome; Thoracic radiculopathy; and Spondylosis without myelopathy or radiculopathy, lumbar region on their pertinent problem list. Pain Assessment: Severity of Chronic pain is reported as a 7 /10. Location: Back Lower,Left/left mid from shingles,lower back denies radiation. Onset: More than a month ago. Quality: Aching,Constant,Sharp,Nagging. Timing: Constant. Modifying factor(s): medication, rest. Vitals:  height is _0  (1.727 m) and weight is 131 lb (59.4 kg). Her temperature is 97.2 F (36.2 C) (abnormal). Her blood pressure is 154/73 (abnormal) and her pulse is 72. Her respiration is 16 and oxygen saturation is 100%.   Reason for encounter: medication management.    Patient follows up today for medication management.  No  significant change in her medical history.  Unfortunately, the patient's husband was diagnosed with pancreatic cancer.  This diagnosis was made last week.  He will be starting chemotherapy soon.  Patient states that she does not find any benefit with Lyrica.  Her dose is at 100 mg twice a day.  She states that she would like to try and wean off of her Lyrica.  This is reasonable.  I provided her to wean instructions as below.  Otherwise I will refill her hydrocodone as below.  UDS up-to-date and appropriate.  She has been dealing with constipation.  This is been refractory to MiraLAX as well as stool softeners.  We discussed utilizing a prescription medication for opioid-induced constipation.  We discussed Movantik.  I will send in a prescription as below.  She will follow-up with me in 2 months.  Of note, patient has been participating in physical therapy.  She did see Dr. Alba Destine and received a bilateral L5-S1 transforaminal ESI.  Mild benefit noted in lower extremity radicular pain.  I informed the patient regarding our policy at the pain clinic and reminded her that all interventional pain therapies should be done here as long we are managing her overall chronic pain.  Patient endorses understanding.   Pharmacotherapy Assessment   04/01/2020  04/01/2020   1  Hydrocodone-Acetamin 10-325 Mg  90.00  30  Bi Lat  [id number]  Nor (0921)  0/0  30.00 MME  Medicare  [place]    Analgesic: Hydrocodone 10 mg 3 times daily as needed, quantity 90/month; MME equals 30  Monitoring: [place] PMP: PDMP reviewed during this encounter.       Pharmacotherapy: No side-effects or adverse reactions reported. Compliance: No problems identified. Effectiveness: Clinically acceptable.  Ignatius Specking, RN  06/24/2020 10:15 AM  Sign when Signing Visit Safety precautions to be maintained throughout the outpatient stay will include: orient to surroundings, keep bed in low position, maintain call bell within reach at all times, provide  assistance with transfer out of bed and ambulation.   Did not bring medication with her today. Took pain medication 06/23/20  Instructed to bring to next appt.    UDS:  Summary  Date Value Ref Range Status  04/01/2020 Note  Final    Comment:    ==================================================================== [facility] 13 (MW) ==================================================================== Test                             Result       Flag       Units  Drug Present and Declared for Prescription Verification   Hydrocodone                    658          EXPECTED   ng/mg creat   Hydromorphone                  39           EXPECTED   ng/mg creat   Dihydrocodeine                 101          EXPECTED   ng/mg creat   Norhydrocodone                 1868         EXPECTED   ng/mg creat    Sources of hydrocodone include scheduled prescription medications.    Hydromorphone, dihydrocodeine and norhydrocodone are expected    metabolites of hydrocodone. Hydromorphone and dihydrocodeine are    also available as scheduled prescription medications.  ==================================================================== Test                      Result    Flag   Units      Ref Range   Creatinine              154              mg/dL      >=20 ==================================================================== Declared Medications:  The flagging and interpretation on this report are based on the  following declared medications.  Unexpected results may arise from  inaccuracies in the declared medications.   **Note: The testing scope of this panel includes these medications:   Hydrocodone   **Note: The testing scope of this panel does not include the  following reported medications:   Acetaminophen  Albuterol  Albuterol (Duoneb)  Atenolol  Atorvastatin  Calcium  Cyanocobalamin  Denosumab  Fish Oil  Fluticasone  Fluticasone (Trelegy)  Ipratropium (Duoneb)  Meloxicam  (Mobic)  Montelukast (Singulair)  Polyethylene Glycol  Prednisone  Pregabalin  Psyllium  Sertraline  Umeclidinium (Trelegy)  Vilanterol (Trelegy)  Vitamin D3 ==================================================================== For clinical consultation, please call [phone]. ====================================================================      ROS  Constitutional: Denies any fever or chills Gastrointestinal: No reported hemesis, hematochezia, vomiting, or acute GI distress Musculoskeletal: Thoracic mid back pain Neurological: No reported episodes of acute onset apraxia, aphasia, dysarthria, agnosia, amnesia, paralysis, loss of coordination, or loss of consciousness  Medication Review  Calcium Carbonate-Vitamin D3, Fluticasone-Umeclidin-Vilant, HYDROcodone-acetaminophen, Propylene Glycol, acetaminophen, albuterol, atenolol, atorvastatin, cyanocobalamin, denosumab, fluticasone, ipratropium-albuterol, meloxicam, montelukast, naloxegol oxalate,  predniSONE, pregabalin, psyllium, and sertraline  History Review  Allergy: Autumn [name] is allergic to ivp dye [iodinated diagnostic agents]. Drug: Autumn [name]  reports no history of drug use. Alcohol:  reports no history of alcohol use. Tobacco:  reports that she has never smoked. She has never used smokeless tobacco. Social: Autumn [name]  reports that she has never smoked. She has never used smokeless tobacco. She reports that she does not drink alcohol and does not use drugs. Medical:  has a past medical history of Allergy, Anemia, Arthritis, COPD (chronic obstructive pulmonary disease) ([place]), Glaucoma, H/O uvulectomy, Hypertension, Osteoporosis, Ovarian cancer ([place]), Postherpetic neuralgia, Pulmonary sarcoidosis ([place]), and Sarcoid. Surgical: Autumn [name]  has a past surgical history that includes Abdominal hysterectomy; Dilation and curettage of uterus; Vulvar lesion removal; Diagnostic laparoscopy; Eye surgery; Colonoscopy with propofol  (N/A, 07/30/2019); and Breast excisional biopsy (Right). Family: family history includes Breast cancer (age of onset: 64) in her mother; Cancer in her mother; Hypertension in her father.  Laboratory Chemistry Profile   Renal Lab Results  Component Value Date   BUN 28 (H) 02/13/2018   CREATININE 0.68 02/13/2018   GFRAA >60 02/13/2018   GFRNONAA >60 02/13/2018     Hepatic Lab Results  Component Value Date   AST 21 03/29/2015   ALT 20 03/29/2015   ALBUMIN 4.4 03/29/2015   ALKPHOS 46 03/29/2015   LIPASE 116 12/15/2013     Electrolytes Lab Results  Component Value Date   NA 145 02/13/2018   K 4.6 02/13/2018   CL 100 02/13/2018   CALCIUM 10.0 02/13/2018   MG 2.7 (H) 02/07/2018   PHOS 3.2 02/07/2018     Bone No results found for: VD25OH, VD125OH2TOT, PY0998PJ8, SN0539JQ7, 25OHVITD1, 25OHVITD2, 25OHVITD3, TESTOFREE, TESTOSTERONE   Inflammation (CRP: Acute Phase) (ESR: Chronic Phase) Lab Results  Component Value Date   ESRSEDRATE 48 (H) 05/25/2011       Note: Above Lab results reviewed.  Recent Imaging Review  MR LUMBAR SPINE WO CONTRAST CLINICAL DATA:  Low back pain radiating to the hips. History of ovarian carcinoma.  EXAM: MRI LUMBAR SPINE WITHOUT CONTRAST  TECHNIQUE: Multiplanar, multisequence MR imaging of the lumbar spine was performed. No intravenous contrast was administered.  COMPARISON:  None.  FINDINGS: Segmentation:  Standard  Alignment: Grade 1 anterolisthesis at L3-4 and levoscoliosis with apex at L3.  Vertebrae:  No fracture, evidence of discitis, or bone lesion.  Conus medullaris and cauda equina: Conus extends to the L2 level. Conus and cauda equina appear normal.  Paraspinal and other soft tissues: Negative.  Disc levels:  L1-L2: Small disc bulge. No spinal [name] stenosis. No neural foraminal stenosis.  L2-L3: Normal disc space and facet joints. No spinal [name] stenosis. No neural foraminal stenosis.  L3-L4: Right asymmetric disc  bulge. No spinal [name] stenosis. No neural foraminal stenosis.  L4-L5: Intermediate sized left foraminal disc extrusion with superior migration. Left lateral recess narrowing without central spinal [name] stenosis. No neural foraminal stenosis.  L5-S1: Normal disc space and facet joints. No spinal [name] stenosis. No neural foraminal stenosis.  Visualized sacrum: Normal.  IMPRESSION: 1. Intermediate sized left foraminal disc extrusion with superior migration at L4-L5 resulting in left lateral recess stenosis. Correlate for left L5 radiculopathy. 2. Grade 1 anterolisthesis at L3-4 and levoscoliosis with apex at L3. 3. No other spinal [name] or neural foraminal stenosis.  Electronically Signed   By: Ulyses Jarred M.D.   On: 05/30/2020 23:22 Note: Reviewed        Physical  Exam  General appearance: Well nourished, well developed, and well hydrated. In no apparent acute distress Mental status: Alert, oriented x 3 (person, place, & time)       Respiratory: No evidence of acute respiratory distress Eyes: PERLA Vitals: BP (!) 154/73   Pulse 72   Temp (!) 97.2 F (36.2 C)   Resp 16   Ht _0  (1.727 m)   Wt 131 lb (59.4 kg)   SpO2 100%   BMI 19.92 kg/m  BMI: Estimated body mass index is 19.92 kg/m as calculated from the following:   Height as of this encounter: _1  (1.727 m).   Weight as of this encounter: 131 lb (59.4 kg). Ideal: Ideal body weight: 63.9 kg (140 lb 14 oz)  Lumbar Spine Area Exam  Skin & Axial Inspection:No masses, redness, or swelling Alignment:Levoscoliosis Functional XNT:ZGYF restricted ROMaffecting primarily the left Stability:No instability detected Muscle Tone/Strength:Functionally intact. No obvious neuro-muscular anomalies detected. Sensory (Neurological):Articular pain pattern Palpation:No palpable anomalies Provocative Tests: Hyperextension/rotation test:Positivebilaterally for facet joint pain., Pain with facet loading Lumbar  quadrant test (Kemp's test):(+)bilaterally for facet joint pain. Lateral bending test:(+)due to pain.  Gait & Posture Assessment  Ambulation:Limited Gait:Limited. Using assistive device to ambulate Posture:Difficulty standing up straight, due to pain  Lower Extremity Exam    Side:Right lower extremity  Side:Left lower extremity  Stability:No instability observed  Stability:No instability observed  Skin & Extremity Inspection:Skin color, temperature, and hair growth are WNL. No peripheral edema or cyanosis. No masses, redness, swelling, asymmetry, or associated skin lesions. No contractures.  Skin & Extremity Inspection:Skin color, temperature, and hair growth are WNL. No peripheral edema or cyanosis. No masses, redness, swelling, asymmetry, or associated skin lesions. No contractures.  Functional VCB:SWHQ restricted ROMfor all joints of the lower extremity   Functional PRF:FMBW restricted ROMfor all joints of the lower extremity   Muscle Tone/Strength:Functionally intact. No obvious neuro-muscular anomalies detected.  Muscle Tone/Strength:Functionally intact. No obvious neuro-muscular anomalies detected.  Sensory (Neurological):Unimpaired  Sensory (Neurological):Unimpaired  DTR: Patellar:deferred today Achilles:deferred today Plantar:deferred today  DTR: Patellar:deferred today Achilles:deferred today Plantar:deferred today  Palpation:No palpable anomalies  Palpation:No palpable anomalies   Assessment   Status Diagnosis  Controlled Controlled Controlled 1. Neuropathic pain   2. Postinfective intercostal neuralgia   3. Post herpetic neuralgia   4. Lumbar facet arthropathy   5. Spondylosis without myelopathy or radiculopathy, lumbar region   6. Thoracic facet joint syndrome   7. Chronic pain syndrome       Plan of Care  Autumn [name] has a current medication list which includes the  following long-term medication(s): atenolol, atorvastatin, calcium 600+d3, fluticasone, ipratropium-albuterol, montelukast, sertraline, and pregabalin.  Pharmacotherapy (Medications Ordered): Meds ordered this encounter  Medications  . pregabalin (LYRICA) 50 MG capsule    Sig: Take 1 capsule (50 mg total) by mouth 3 (three) times daily for 15 days, THEN 1 capsule (50 mg total) 2 (two) times daily for 15 days, THEN 1 capsule (50 mg total) at bedtime.    Dispense:  105 capsule    Refill:  0    Fill one day early if pharmacy is closed on scheduled refill date. May substitute for generic if available.  [name] HYDROcodone-acetaminophen (NORCO) 10-325 MG tablet    Sig: Take 1 tablet by mouth every 8 (eight) hours as needed for severe pain. Must last 30 days.    Dispense:  90 tablet    Refill:  0    Chronic Pain. (STOP Act - Not  applicable). Fill one day early if closed on scheduled refill date.  [name] HYDROcodone-acetaminophen (NORCO) 10-325 MG tablet    Sig: Take 1 tablet by mouth every 8 (eight) hours as needed for severe pain. Must last 30 days.    Dispense:  90 tablet    Refill:  0    Chronic Pain. (STOP Act - Not applicable). Fill one day early if closed on scheduled refill date.  . naloxegol oxalate (MOVANTIK) 12.5 MG TABS tablet    Sig: Take 1 tablet (12.5 mg total) by mouth daily. Do not break tablet. Take on empty stomach 1 hour before or 2 hours after a meal.    Dispense:  30 tablet    Refill:  1    Fill one day early if pharmacy is closed on scheduled refill date. May substitute for generic if available.   Reviewed pain contract as well as policy regarding interventional pain procedures while she has an active pain contract with the pain clinic.  Follow-up plan:   Return in about 8 weeks (around 08/19/2020) for Medication Management, in person.    Recent Visits Date Type Provider Dept  04/01/20 Office Visit Gillis Santa, MD [facility]  Showing recent visits within past 90 days  and meeting all other requirements Today's Visits Date Type Provider Dept  06/24/20 Office Visit Gillis Santa, MD [facility]  Showing today's visits and meeting all other requirements Future Appointments Date Type Provider Dept  08/21/20 Appointment Gillis Santa, MD [facility]  Showing future appointments within next 90 days and meeting all other requirements  I discussed the assessment and treatment plan with the patient. The patient was provided an opportunity to ask questions and all were answered. The patient agreed with the plan and demonstrated an understanding of the instructions.  Patient advised to call back or seek an in-person evaluation if the symptoms or condition worsens.  Duration of encounter: 30 minutes.  Note by: Gillis Santa, MD Date: 06/24/2020; Time: 10:59 AM

## 2020-06-26 ENCOUNTER — Encounter: Payer: Medicare PPO | Admitting: Student in an Organized Health Care Education/Training Program

## 2020-07-09 ENCOUNTER — Other Ambulatory Visit: Payer: Self-pay | Admitting: Internal Medicine

## 2020-07-09 DIAGNOSIS — Z1231 Encounter for screening mammogram for malignant neoplasm of breast: Secondary | ICD-10-CM

## 2020-08-04 ENCOUNTER — Ambulatory Visit: Payer: Medicare PPO | Admitting: Podiatry

## 2020-08-07 ENCOUNTER — Encounter: Payer: Self-pay | Admitting: Podiatry

## 2020-08-07 ENCOUNTER — Ambulatory Visit: Payer: Medicare PPO | Admitting: Podiatry

## 2020-08-07 ENCOUNTER — Other Ambulatory Visit: Payer: Self-pay

## 2020-08-07 DIAGNOSIS — B351 Tinea unguium: Secondary | ICD-10-CM | POA: Diagnosis not present

## 2020-08-07 DIAGNOSIS — M79675 Pain in left toe(s): Secondary | ICD-10-CM | POA: Diagnosis not present

## 2020-08-07 DIAGNOSIS — M79674 Pain in right toe(s): Secondary | ICD-10-CM

## 2020-08-07 DIAGNOSIS — B0229 Other postherpetic nervous system involvement: Secondary | ICD-10-CM

## 2020-08-07 NOTE — Progress Notes (Signed)
This patient returns to my office for at risk foot care.  This patient requires this care by a professional since this patient will be at risk due to having post herpetic neuralgia. This patient is unable to cut nails herself since the patient cannot reach her nails.These nails are painful walking and wearing shoes.  This patient presents for at risk foot care today.  General Appearance  Alert, conversant and in no acute stress.  Vascular  Dorsalis pedis  are palpable  bilaterally. Posterior tibial pulses are absent  Bilateral. Capillary return is within normal limits  bilaterally. Cold feet.   Bilaterally. Absent digital hair  Bilateral.  Neurologic  Senn-Weinstein monofilament wire test within normal limits  bilaterally. Muscle power within normal limits bilaterally.  Nails Thick disfigured discolored nails with subungual debris  Hallux nails  bilaterally. No evidence of bacterial infection or drainage bilaterally.  Orthopedic  No limitations of motion  feet .  No crepitus or effusions noted.  No bony pathology or digital deformities noted.  Skin  normotropic skin with no porokeratosis noted bilaterally.  No signs of infections or ulcers noted.     Onychomycosis  Pain in right toes  Pain in left toes  Consent was obtained for treatment procedures.   Mechanical debridement of nails 1-5  bilaterally performed with a nail nipper.  Filed with dremel without incident.    Return office visit  3 months                    Told patient to return for periodic foot care and evaluation due to potential at risk complications.   Gardiner Barefoot DPM

## 2020-08-14 ENCOUNTER — Ambulatory Visit
Admission: RE | Admit: 2020-08-14 | Discharge: 2020-08-14 | Disposition: A | Discharge status: Home / Self Care | Payer: Medicare PPO | Source: Ambulatory Visit | Attending: Internal Medicine | Admitting: Internal Medicine

## 2020-08-14 ENCOUNTER — Other Ambulatory Visit: Payer: Self-pay

## 2020-08-14 DIAGNOSIS — Z1231 Encounter for screening mammogram for malignant neoplasm of breast: Secondary | ICD-10-CM

## 2020-08-21 ENCOUNTER — Encounter: Payer: Self-pay | Admitting: Student in an Organized Health Care Education/Training Program

## 2020-08-21 ENCOUNTER — Other Ambulatory Visit: Payer: Self-pay

## 2020-08-21 ENCOUNTER — Ambulatory Visit
Payer: Medicare PPO | Attending: Student in an Organized Health Care Education/Training Program | Admitting: Student in an Organized Health Care Education/Training Program

## 2020-08-21 VITALS — BP 195/66 | HR 81 | Temp 97.0°F | Resp 16 | Ht 68.0 in | Wt 125.0 lb

## 2020-08-21 DIAGNOSIS — M47816 Spondylosis without myelopathy or radiculopathy, lumbar region: Secondary | ICD-10-CM | POA: Diagnosis not present

## 2020-08-21 DIAGNOSIS — M47894 Other spondylosis, thoracic region: Secondary | ICD-10-CM

## 2020-08-21 DIAGNOSIS — G894 Chronic pain syndrome: Secondary | ICD-10-CM

## 2020-08-21 DIAGNOSIS — M792 Neuralgia and neuritis, unspecified: Secondary | ICD-10-CM | POA: Diagnosis not present

## 2020-08-21 DIAGNOSIS — B0229 Other postherpetic nervous system involvement: Secondary | ICD-10-CM

## 2020-08-21 MED ORDER — HYDROCODONE-ACETAMINOPHEN 10-325 MG PO TABS: 1.0000 | ORAL_TABLET | Freq: Three times a day (TID) | ORAL | 0 refills | Status: DC | PRN

## 2020-08-21 MED ORDER — HYDROCODONE-ACETAMINOPHEN 10-325 MG PO TABS: 1.0000 | ORAL_TABLET | Freq: Three times a day (TID) | ORAL | 0 refills | Status: AC | PRN

## 2020-08-21 MED ORDER — LINACLOTIDE 145 MCG PO CAPS: 145.0000 ug | ORAL_CAPSULE | Freq: Every day | ORAL | 0 refills | Status: DC

## 2020-08-21 NOTE — Progress Notes (Signed)
Nursing Pain Medication Assessment:  Safety precautions to be maintained throughout the outpatient stay will include: orient to surroundings, keep bed in low position, maintain call bell within reach at all times, provide assistance with transfer out of bed and ambulation.  Medication Inspection Compliance: Pill count conducted under aseptic conditions, in front of the patient. Neither the pills nor the bottle was removed from the patient's sight at any time. Once count was completed pills were immediately returned to the patient in their original bottle.  Medication: Hydrocodone/APAP Pill/Patch Count: 59 of 90 pills remain Pill/Patch Appearance: Markings consistent with prescribed medication Bottle Appearance: Standard pharmacy container. Clearly labeled. Filled Date: 03 / 15 / 2022 Last Medication intake:  Today

## 2020-08-21 NOTE — Progress Notes (Signed)
PROVIDER NOTE: Information contained herein reflects review and annotations entered in association with encounter. Interpretation of such information and data should be left to medically-trained personnel. Information provided to patient can be located elsewhere in the medical record under "Patient Instructions". Document created using STT-dictation technology, any transcriptional errors that may result from process are unintentional.    Patient: Autumn [name]  Service Category: E/M  Provider: Gillis Santa, MD  DOB: [birth year]  DOS: 08/21/2020  Specialty: Interventional Pain Management  MRN: [id number]  Setting: Ambulatory outpatient  PCP: Autumn Hire, MD  Type: Established Patient    Referring Provider: Baxter Hire, MD  Location: Office  Delivery: Face-to-face     HPI  [name], a 75 y.o. year old female, is here today because of her Chronic pain syndrome [G89.4]. Autumn [name] primary complain today is Abdominal Pain (LUQ ) Last encounter: My last encounter with her was on 06/24/20 Pertinent problems: Autumn [name] has COPD (chronic obstructive pulmonary disease) ([place]); Post herpetic neuralgia; Pulmonary sarcoidosis ([place]); Chronic pain syndrome; Thoracic radiculopathy; and Spondylosis without myelopathy or radiculopathy, lumbar region on their pertinent problem list. Pain Assessment: Severity of Chronic pain is reported as a 7 /10. Location: Abdomen Left,Upper/around to mid/upper back. Onset: More than a month ago. Quality: Aching,Sharp,Constant,Nagging. Timing: Constant. Modifying factor(s): meds, rest. Vitals:  height is _0  (1.727 m) and weight is 125 lb (56.7 kg). Her temporal temperature is 97 F (36.1 C) (abnormal). Her blood pressure is 195/66 (abnormal) and her pulse is 81. Her respiration is 16 and oxygen saturation is 100%.   Reason for encounter: medication management.    S/P  bilateral L5-S1 transforaminal ESI x 2 with limited response Presents for refill of  hydrocodone below  Pharmacotherapy Assessment    Analgesic: Hydrocodone 10 mg 3 times daily as needed, quantity 90/month; MME equals 30  Monitoring: [place] PMP: PDMP reviewed during this encounter.       Pharmacotherapy: No side-effects or adverse reactions reported. Compliance: No problems identified. Effectiveness: Clinically acceptable.  Autumn Patience, RN  08/21/2020  3:27 PM  Sign when Signing Visit Nursing Pain Medication Assessment:  Safety precautions to be maintained throughout the outpatient stay will include: orient to surroundings, keep bed in low position, maintain call bell within reach at all times, provide assistance with transfer out of bed and ambulation.  Medication Inspection Compliance: Pill count conducted under aseptic conditions, in front of the patient. Neither the pills nor the bottle was removed from the patient's sight at any time. Once count was completed pills were immediately returned to the patient in their original bottle.  Medication: Hydrocodone/APAP Pill/Patch Count: 59 of 90 pills remain Pill/Patch Appearance: Markings consistent with prescribed medication Bottle Appearance: Standard pharmacy container. Clearly labeled. Filled Date: 03 / 15 / 2022 Last Medication intake:  Today    UDS:  Summary  Date Value Ref Range Status  04/01/2020 Note  Final    Comment:    ==================================================================== [facility] (MW) ==================================================================== Test                             Result       Flag       Units  Drug Present and Declared for Prescription Verification   Hydrocodone                    658          EXPECTED  ng/mg creat   Hydromorphone                  39           EXPECTED   ng/mg creat   Dihydrocodeine                 101          EXPECTED   ng/mg creat   Norhydrocodone                 1868         EXPECTED   ng/mg creat    Sources of hydrocodone include  scheduled prescription medications.    Hydromorphone, dihydrocodeine and norhydrocodone are expected    metabolites of hydrocodone. Hydromorphone and dihydrocodeine are    also available as scheduled prescription medications.  ==================================================================== Test                      Result    Flag   Units      Ref Range   Creatinine              154              mg/dL      >=20 ==================================================================== Declared Medications:  The flagging and interpretation on this report are based on the  following declared medications.  Unexpected results may arise from  inaccuracies in the declared medications.   **Note: The testing scope of this panel includes these medications:   Hydrocodone   **Note: The testing scope of this panel does not include the  following reported medications:   Acetaminophen  Albuterol  Albuterol (Duoneb)  Atenolol  Atorvastatin  Calcium  Cyanocobalamin  Denosumab  Fish Oil  Fluticasone  Fluticasone (Trelegy)  Ipratropium (Duoneb)  Meloxicam (Mobic)  Montelukast (Singulair)  Polyethylene Glycol  Prednisone  Pregabalin  Psyllium  Sertraline  Umeclidinium (Trelegy)  Vilanterol (Trelegy)  Vitamin D3 ==================================================================== For clinical consultation, please call [phone]. ====================================================================      ROS  Constitutional: Denies any fever or chills Gastrointestinal: No reported hemesis, hematochezia, vomiting, or acute GI distress Musculoskeletal: Thoracic mid back pain Neurological: No reported episodes of acute onset apraxia, aphasia, dysarthria, agnosia, amnesia, paralysis, loss of coordination, or loss of consciousness  Medication Review  Fluticasone-Umeclidin-Vilant, HYDROcodone-acetaminophen, Propylene Glycol, acetaminophen, albuterol, atenolol, atorvastatin,  cyanocobalamin, denosumab, fluticasone, ipratropium-albuterol, linaclotide, montelukast, naloxegol oxalate, predniSONE, pregabalin, psyllium, and sertraline  History Review  Allergy: Autumn [name] is allergic to ivp dye [iodinated diagnostic agents]. Drug: Autumn [name]  reports no history of drug use. Alcohol:  reports no history of alcohol use. Tobacco:  reports that she has never smoked. She has never used smokeless tobacco. Social: Autumn [name]  reports that she has never smoked. She has never used smokeless tobacco. She reports that she does not drink alcohol and does not use drugs. Medical:  has a past medical history of Allergy, Anemia, Arthritis, COPD (chronic obstructive pulmonary disease) ([place]), Glaucoma, H/O uvulectomy, Hypertension, Osteoporosis, Ovarian cancer ([place]), Postherpetic neuralgia, Pulmonary sarcoidosis ([place]), and Sarcoid. Surgical: Autumn [name]  has a past surgical history that includes Abdominal hysterectomy; Dilation and curettage of uterus; Vulvar lesion removal; Diagnostic laparoscopy; Eye surgery; Colonoscopy with propofol (N/A, 07/30/2019); and Breast excisional biopsy (Right). Family: family history includes Breast cancer (age of onset: 39) in her mother; Cancer in her mother; Hypertension in her father.  Laboratory Chemistry Profile   Renal Lab Results  Component Value Date  BUN 28 (H) 02/13/2018   CREATININE 0.68 02/13/2018   GFRAA >60 02/13/2018   GFRNONAA >60 02/13/2018     Hepatic Lab Results  Component Value Date   AST 21 03/29/2015   ALT 20 03/29/2015   ALBUMIN 4.4 03/29/2015   ALKPHOS 46 03/29/2015   LIPASE 116 12/15/2013     Electrolytes Lab Results  Component Value Date   NA 145 02/13/2018   K 4.6 02/13/2018   CL 100 02/13/2018   CALCIUM 10.0 02/13/2018   MG 2.7 (H) 02/07/2018   PHOS 3.2 02/07/2018     Bone No results found for: VD25OH, VD125OH2TOT, KD9833AS5, KN3976BH4, 25OHVITD1, 25OHVITD2, 25OHVITD3, TESTOFREE, TESTOSTERONE   Inflammation  (CRP: Acute Phase) (ESR: Chronic Phase) Lab Results  Component Value Date   ESRSEDRATE 48 (H) 05/25/2011       Note: Above Lab results reviewed.  Recent Imaging Review  MM 3D SCREEN BREAST BILATERAL CLINICAL DATA:  Screening.  EXAM: DIGITAL SCREENING BILATERAL MAMMOGRAM WITH TOMOSYNTHESIS AND CAD  TECHNIQUE: Bilateral screening digital craniocaudal and mediolateral oblique mammograms were obtained. Bilateral screening digital breast tomosynthesis was performed. The images were evaluated with computer-aided detection.  COMPARISON:  Previous exam(s).  ACR Breast Density Category c: The breast tissue is heterogeneously dense, which may obscure small masses.  FINDINGS: In the right breast, a possible mass warrants further evaluation. In the left breast, no findings suspicious for malignancy.  IMPRESSION: Further evaluation is suggested for a possible mass in the right breast.  RECOMMENDATION: Diagnostic mammogram and possibly ultrasound of the right breast. (Code:[id number])  The patient will be contacted regarding the findings, and additional imaging will be scheduled.  BI-RADS CATEGORY  0: Incomplete. Need additional imaging evaluation and/or prior mammograms for comparison.  Electronically Signed   By: Nolon Nations M.D.   On: 08/15/2020 09:02 Note: Reviewed        Physical Exam  General appearance: Well nourished, well developed, and well hydrated. In no apparent acute distress Mental status: Alert, oriented x 3 (person, place, & time)       Respiratory: No evidence of acute respiratory distress Eyes: PERLA Vitals: BP (!) 195/66   Pulse 81   Temp (!) 97 F (36.1 C) (Temporal)   Resp 16 Comment: 2L O2  Ht _0  (1.727 m)   Wt 125 lb (56.7 kg)   SpO2 100%   BMI 19.01 kg/m  BMI: Estimated body mass index is 19.01 kg/m as calculated from the following:   Height as of this encounter: _1  (1.727 m).   Weight as of this encounter: 125 lb (56.7  kg). Ideal: Ideal body weight: 63.9 kg (140 lb 14 oz)  Lumbar Spine Area Exam  Skin & Axial Inspection:No masses, redness, or swelling Alignment:Levoscoliosis Functional LPF:XTKW restricted ROMaffecting primarily the left Stability:No instability detected Muscle Tone/Strength:Functionally intact. No obvious neuro-muscular anomalies detected. Sensory (Neurological):Articular pain pattern Palpation:No palpable anomalies Provocative Tests: Hyperextension/rotation test:Positivebilaterally for facet joint pain., Pain with facet loading Lumbar quadrant test (Kemp's test):(+)bilaterally for facet joint pain. Lateral bending test:(+)due to pain.  Gait & Posture Assessment  Ambulation:Limited Gait:Limited. Using assistive device to ambulate Posture:Difficulty standing up straight, due to pain  Lower Extremity Exam    Side:Right lower extremity  Side:Left lower extremity  Stability:No instability observed  Stability:No instability observed  Skin & Extremity Inspection:Skin color, temperature, and hair growth are WNL. No peripheral edema or cyanosis. No masses, redness, swelling, asymmetry, or associated skin lesions. No contractures.  Skin & Extremity Inspection:Skin color, temperature, and  hair growth are WNL. No peripheral edema or cyanosis. No masses, redness, swelling, asymmetry, or associated skin lesions. No contractures.  Functional XIP:JASN restricted ROMfor all joints of the lower extremity   Functional KNL:ZJQB restricted ROMfor all joints of the lower extremity   Muscle Tone/Strength:Functionally intact. No obvious neuro-muscular anomalies detected.  Muscle Tone/Strength:Functionally intact. No obvious neuro-muscular anomalies detected.  Sensory (Neurological):Unimpaired  Sensory (Neurological):Unimpaired  DTR: Patellar:deferred today Achilles:deferred today Plantar:deferred today   DTR: Patellar:deferred today Achilles:deferred today Plantar:deferred today  Palpation:No palpable anomalies  Palpation:No palpable anomalies   Assessment   Status Diagnosis  Controlled Controlled Controlled 1. Chronic pain syndrome   2. Postinfective intercostal neuralgia   3. Post herpetic neuralgia   4. Neuropathic pain   5. Lumbar facet arthropathy   6. Spondylosis without myelopathy or radiculopathy, lumbar region   7. Thoracic facet joint syndrome       Plan of Care  Autumn [name] has a current medication list which includes the following long-term medication(s): atenolol, atorvastatin, fluticasone, ipratropium-albuterol, linaclotide, montelukast, pregabalin, and sertraline.  Pharmacotherapy (Medications Ordered): Meds ordered this encounter  Medications  . HYDROcodone-acetaminophen (NORCO) 10-325 MG tablet    Sig: Take 1 tablet by mouth every 8 (eight) hours as needed for severe pain. Must last 30 days.    Dispense:  90 tablet    Refill:  0    Chronic Pain. (STOP Act - Not applicable). Fill one day early if closed on scheduled refill date.  [name] HYDROcodone-acetaminophen (NORCO) 10-325 MG tablet    Sig: Take 1 tablet by mouth every 8 (eight) hours as needed for severe pain. Must last 30 days.    Dispense:  90 tablet    Refill:  0    Chronic Pain. (STOP Act - Not applicable). Fill one day early if closed on scheduled refill date.  . linaclotide (LINZESS) 145 MCG CAPS capsule    Sig: Take 1 capsule (145 mcg total) by mouth daily before breakfast. As needed.    Dispense:  90 capsule    Refill:  0    Fill one day early if pharmacy is closed on scheduled refill date. May substitute for generic if available.   Reviewed pain contract as well as policy regarding interventional pain procedures while she has an active pain contract with the pain clinic.  Follow-up plan:   Return in about 3 months (around 11/21/2020) for Medication Management, in person.    Recent  Visits Date Type Provider Dept  06/24/20 Office Visit Autumn Santa, MD [facility]  Showing recent visits within past 90 days and meeting all other requirements Today's Visits Date Type Provider Dept  08/21/20 Office Visit Autumn Santa, MD [facility]  Showing today's visits and meeting all other requirements Future Appointments Date Type Provider Dept  11/13/20 Appointment Autumn Santa, MD [facility]  Showing future appointments within next 90 days and meeting all other requirements  I discussed the assessment and treatment plan with the patient. The patient was provided an opportunity to ask questions and all were answered. The patient agreed with the plan and demonstrated an understanding of the instructions.  Patient advised to call back or seek an in-person evaluation if the symptoms or condition worsens.  Duration of encounter: 30 minutes.  Note by: Autumn Santa, MD Date: 08/21/2020; Time: 3:49 PM

## 2020-08-25 ENCOUNTER — Other Ambulatory Visit: Payer: Self-pay | Admitting: Internal Medicine

## 2020-08-25 DIAGNOSIS — N6310 Unspecified lump in the right breast, unspecified quadrant: Secondary | ICD-10-CM

## 2020-08-25 DIAGNOSIS — R928 Other abnormal and inconclusive findings on diagnostic imaging of breast: Secondary | ICD-10-CM

## 2020-08-27 ENCOUNTER — Ambulatory Visit
Admission: RE | Admit: 2020-08-27 | Discharge: 2020-08-27 | Disposition: A | Discharge status: Home / Self Care | Payer: Medicare PPO | Source: Ambulatory Visit | Attending: Internal Medicine | Admitting: Internal Medicine

## 2020-08-27 ENCOUNTER — Other Ambulatory Visit: Payer: Self-pay

## 2020-08-27 DIAGNOSIS — N6310 Unspecified lump in the right breast, unspecified quadrant: Secondary | ICD-10-CM

## 2020-08-27 DIAGNOSIS — R928 Other abnormal and inconclusive findings on diagnostic imaging of breast: Secondary | ICD-10-CM

## 2020-09-01 ENCOUNTER — Telehealth: Payer: Self-pay | Admitting: Student in an Organized Health Care Education/Training Program

## 2020-09-01 NOTE — Telephone Encounter (Signed)
Dr Holley Raring, the patient is having to take Dulcolax on top of the Linzess and metamucil.  She wanted to make sure that this was OK. Just wanting to clarify.

## 2020-09-01 NOTE — Telephone Encounter (Signed)
Patient is having severe constipation with her medications/ the laxative called in is not helping. Please call patient asap.

## 2020-09-11 ENCOUNTER — Telehealth: Payer: Self-pay | Admitting: Student in an Organized Health Care Education/Training Program

## 2020-09-11 NOTE — Telephone Encounter (Signed)
Patient started on LINZESS per Dr. Holley Raring about 2+ weeks ago. Complaining of diarrhea and abdominal pain (more than usual) and decreased appetite. In Dr. Elwyn Lade absence and her complaints, I instructed the patient to call her PCP and touch base with her.

## 2020-09-15 ENCOUNTER — Telehealth: Payer: Self-pay | Admitting: Student in an Organized Health Care Education/Training Program

## 2020-09-15 NOTE — Telephone Encounter (Signed)
Is there anything else we can do for this patient or should she consult her PCP

## 2020-09-15 NOTE — Telephone Encounter (Signed)
Patient states that she is now having diarrhea and would like to make a virtual appointment with Dr Holley Raring as soon as possible to discuss coming off of the Hydrocodone.  Please schedule for a virtual appointment.

## 2020-09-17 ENCOUNTER — Telehealth: Payer: Self-pay

## 2020-09-18 ENCOUNTER — Ambulatory Visit
Payer: Medicare PPO | Attending: Student in an Organized Health Care Education/Training Program | Admitting: Student in an Organized Health Care Education/Training Program

## 2020-09-18 ENCOUNTER — Other Ambulatory Visit: Payer: Self-pay

## 2020-09-18 DIAGNOSIS — M792 Neuralgia and neuritis, unspecified: Secondary | ICD-10-CM | POA: Diagnosis not present

## 2020-09-18 DIAGNOSIS — B0229 Other postherpetic nervous system involvement: Secondary | ICD-10-CM

## 2020-09-18 DIAGNOSIS — M47816 Spondylosis without myelopathy or radiculopathy, lumbar region: Secondary | ICD-10-CM | POA: Diagnosis not present

## 2020-09-18 DIAGNOSIS — M47894 Other spondylosis, thoracic region: Secondary | ICD-10-CM | POA: Diagnosis not present

## 2020-09-18 NOTE — Progress Notes (Signed)
Patient: Autumn [name]  Service Category: E/M  Provider: Gillis Santa, MD  DOB: Oct 01, 1945  DOS: 09/18/2020  Location: Office  MRN: [id number]  Setting: Ambulatory outpatient  Referring Provider: Baxter Hire, MD  Type: Established Patient  Specialty: Interventional Pain Management  PCP: Baxter Hire, MD  Location: Remote location  Delivery: TeleHealth     Virtual Encounter - Pain Management PROVIDER NOTE: Information contained herein reflects review and annotations entered in association with encounter. Interpretation of such information and data should be left to medically-trained personnel. Information provided to patient can be located elsewhere in the medical record under "Patient Instructions". Document created using STT-dictation technology, any transcriptional errors that may result from process are unintentional.    Contact & Pharmacy Preferred: [phone] Home: [phone] (home) Mobile: [phone] (mobile) E-mail: [email or web address]  [id number] - [place], [place] - [address] [place] [address] Phone: [phone] Fax: [phone]   Pre-screening  Autumn [name] offered "in-person" vs "virtual" encounter. She indicated preferring virtual for this encounter.   Reason COVID-19*  Social distancing based on CDC and AMA recommendations.   I contacted Autumn [name] on 09/18/2020 via video conference.      I clearly identified myself as Gillis Santa, MD. I verified that I was speaking with the correct person using two identifiers (Name: Autumn [name], and date of birth: Apr 20, 1945).  Consent I sought verbal advanced consent from Autumn [name] for virtual visit interactions. I informed Autumn [name] of possible security and privacy concerns, risks, and limitations associated with providing "not-in-person" medical evaluation and management services. I also informed Autumn [name] of the availability of "in-person" appointments. Finally, I informed her  that there would be a charge for the virtual visit and that she could be  personally, fully or partially, financially responsible for it. Autumn [name] expressed understanding and agreed to proceed.   Historic Elements   Autumn [name] is a 75 y.o. year old, female patient evaluated today after our last contact on 09/15/2020. Autumn [name]  has a past medical history of Allergy, Anemia, Arthritis, COPD (chronic obstructive pulmonary disease) ([place]), Glaucoma, H/O uvulectomy, Hypertension, Osteoporosis, Ovarian cancer ([place]), Postherpetic neuralgia, Pulmonary sarcoidosis ([place]), and Sarcoid. She also  has a past surgical history that includes Abdominal hysterectomy; Dilation and curettage of uterus; Vulvar lesion removal; Diagnostic laparoscopy; Eye surgery; Colonoscopy with propofol (N/A, 07/30/2019); and Breast excisional biopsy (Right). Autumn [name] has a current medication list which includes the following prescription(s): acetaminophen, albuterol, atenolol, atorvastatin, cyanocobalamin, denosumab, fluticasone, trelegy ellipta, [START ON 09/24/2020] hydrocodone-acetaminophen, [START ON 10/24/2020] hydrocodone-acetaminophen, ipratropium-albuterol, linaclotide, montelukast, polyethylene glycol powder, prednisone, propylene glycol, psyllium, sertraline, and pregabalin. She  reports that she has never smoked. She has never used smokeless tobacco. She reports that she does not drink alcohol and does not use drugs. Autumn [name] is allergic to ivp dye [iodinated diagnostic agents].   HPI  Today, she is being contacted for follow-up evaluation  Patient has been having diarrhea.  She is taking MiraLAX and Linzess as well.  I informed her to stop both of these medications and only take them if she goes more than 2 days without having a bowel movement.  She has stopped these medications and continues to have diarrhea I instructed her to follow-up with her primary care doctor and seek a referral to a GI specialist.  I instructed  her that she can refill her hydrocodone as she has a prescription waiting for her.  She is instructed to continue Lyrica as prescribed.  She will follow-up with me in August as scheduled.  Pharmacotherapy Assessment  Analgesic: hydrocodone 5 mg; 1/2 tablet to 1 tablet daily PRN  Monitoring: [place] PMP: PDMP reviewed during this encounter.       Pharmacotherapy: No side-effects or adverse reactions reported. Compliance: No problems identified. Effectiveness: Clinically acceptable. Plan: Refer to "POC".  UDS:  Summary  Date Value Ref Range Status  04/01/2020 Note  Final    Comment:    ==================================================================== [employer] Select 13 (MW) ==================================================================== Test                             Result       Flag       Units  Drug Present and Declared for Prescription Verification   Hydrocodone                    658          EXPECTED   ng/mg creat   Hydromorphone                  39           EXPECTED   ng/mg creat   Dihydrocodeine                 101          EXPECTED   ng/mg creat   Norhydrocodone                 1868         EXPECTED   ng/mg creat    Sources of hydrocodone include scheduled prescription medications.    Hydromorphone, dihydrocodeine and norhydrocodone are expected    metabolites of hydrocodone. Hydromorphone and dihydrocodeine are    also available as scheduled prescription medications.  ==================================================================== Test                      Result    Flag   Units      Ref Range   Creatinine              154              mg/dL      >=20 ==================================================================== Declared Medications:  The flagging and interpretation on this report are based on the  following declared medications.  Unexpected results may arise from  inaccuracies in the declared medications.   **Note: The testing scope of this panel  includes these medications:   Hydrocodone   **Note: The testing scope of this panel does not include the  following reported medications:   Acetaminophen  Albuterol  Albuterol (Duoneb)  Atenolol  Atorvastatin  Calcium  Cyanocobalamin  Denosumab  Fish Oil  Fluticasone  Fluticasone (Trelegy)  Ipratropium (Duoneb)  Meloxicam (Mobic)  Montelukast (Singulair)  Polyethylene Glycol  Prednisone  Pregabalin  Psyllium  Sertraline  Umeclidinium (Trelegy)  Vilanterol (Trelegy)  Vitamin D3 ==================================================================== For clinical consultation, please call [phone]. ====================================================================     Laboratory Chemistry Profile   Renal Lab Results  Component Value Date   BUN 28 (H) 02/13/2018   CREATININE 0.68 02/13/2018   GFRAA >60 02/13/2018   GFRNONAA >60 02/13/2018     Hepatic Lab Results  Component Value Date   AST 21 03/29/2015   ALT 20 03/29/2015   ALBUMIN 4.4 03/29/2015   ALKPHOS 46 03/29/2015   LIPASE 116  12/15/2013     Electrolytes Lab Results  Component Value Date   NA 145 02/13/2018   K 4.6 02/13/2018   CL 100 02/13/2018   CALCIUM 10.0 02/13/2018   MG 2.7 (H) 02/07/2018   PHOS 3.2 02/07/2018     Bone No results found for: VD25OH, KT625WL8LHT, DS2876OT1, XB2620BT5, 25OHVITD1, 25OHVITD2, 25OHVITD3, TESTOFREE, TESTOSTERONE   Inflammation (CRP: Acute Phase) (ESR: Chronic Phase) Lab Results  Component Value Date   ESRSEDRATE 48 (H) 05/25/2011       Note: Above Lab results reviewed.  Imaging  MM DIAG BREAST TOMO UNI RIGHT CLINICAL DATA:  75 year old female recalled from screening mammogram dated 08/14/2020 for a possible right breast mass.  EXAM: DIGITAL DIAGNOSTIC UNILATERAL RIGHT MAMMOGRAM WITH TOMOSYNTHESIS AND CAD  TECHNIQUE: Right digital diagnostic mammography and breast tomosynthesis was performed. The images were evaluated with computer-aided  detection.  COMPARISON:  Previous exam(s).  ACR Breast Density Category c: The breast tissue is heterogeneously dense, which may obscure small masses.  FINDINGS: Previously described, possible mass in the upper right breast at posterior depth is seen on the MLO projection only resolves on today's additional views. No suspicious findings are identified.  IMPRESSION: No mammographic evidence of malignancy.  RECOMMENDATION: Screening mammogram in one year.(Code:[id number]-01Y)  I have discussed the findings and recommendations with the patient. If applicable, a reminder letter will be sent to the patient regarding the next appointment.  BI-RADS CATEGORY  1: Negative.  Electronically Signed   By: Kristopher Oppenheim M.D.   On: 08/27/2020 15:39  Assessment  The primary encounter diagnosis was [name] herpetic neuralgia. Diagnoses of Neuropathic pain, Lumbar facet arthropathy, Spondylosis without myelopathy or radiculopathy, lumbar region, and Thoracic facet joint syndrome were also pertinent to this visit.  Plan of Care   Discontinue MiraLAX and Linzess and see if diarrhea improves. Continue hydrocodone and Lyrica as prescribed. Keep follow-up for medication management for August 2.   Follow-up plan:   Return for Keep sch. appt.    Recent Visits Date Type Provider Dept  08/21/20 Office Visit Gillis Santa, MD [facility]  06/24/20 Office Visit Gillis Santa, MD [facility]  Showing recent visits within past 90 days and meeting all other requirements Today's Visits Date Type Provider Dept  09/18/20 Telemedicine Gillis Santa, MD [facility]  Showing today's visits and meeting all other requirements Future Appointments Date Type Provider Dept  11/11/20 Appointment Gillis Santa, MD [facility]  Showing future appointments within next 90 days and meeting all other requirements I discussed the assessment and treatment plan with the patient. The  patient was provided an opportunity to ask questions and all were answered. The patient agreed with the plan and demonstrated an understanding of the instructions.  Patient advised to call back or seek an in-person evaluation if the symptoms or condition worsens.  Duration of encounter: 59mnutes.  Note by: BGillis Santa MD Date: 09/18/2020; Time: 3:35 PM

## 2020-09-19 ENCOUNTER — Telehealth: Payer: Self-pay | Admitting: Student in an Organized Health Care Education/Training Program

## 2020-09-19 NOTE — Telephone Encounter (Signed)
Called patient and she states that she only has 2 pills left. She said that DR Holley Raring instructed her to increase frequency to two times a day and she will run out. Instructed her to only take one a day until I hear back from Dr Holley Raring. Medication is to be filled on 6/15.

## 2020-09-19 NOTE — Telephone Encounter (Signed)
Called pharm and they have a prescription that she can pick up. Called and informed patient.

## 2020-09-22 ENCOUNTER — Telehealth: Payer: Self-pay | Admitting: Student in an Organized Health Care Education/Training Program

## 2020-09-22 NOTE — Telephone Encounter (Signed)
Called patient. States her stomach is still hurting and complains of nausea and cramping even with bowel movements being more regular. I instructed her to contact PCP. This may be another type of medical issue. She states she will call them.

## 2020-09-22 NOTE — Telephone Encounter (Signed)
Returned patient call. No answer and no way to leave voicemail.

## 2020-10-07 ENCOUNTER — Emergency Department: Payer: Medicare PPO

## 2020-10-07 ENCOUNTER — Other Ambulatory Visit: Payer: Self-pay

## 2020-10-07 ENCOUNTER — Emergency Department
Admission: EM | Admit: 2020-10-07 | Discharge: 2020-10-07 | Disposition: A | Discharge status: Home / Self Care | Payer: Medicare PPO | Attending: Emergency Medicine | Admitting: Emergency Medicine

## 2020-10-07 DIAGNOSIS — I10 Essential (primary) hypertension: Secondary | ICD-10-CM | POA: Diagnosis not present

## 2020-10-07 DIAGNOSIS — Z79899 Other long term (current) drug therapy: Secondary | ICD-10-CM | POA: Insufficient documentation

## 2020-10-07 DIAGNOSIS — I313 Pericardial effusion (noninflammatory): Secondary | ICD-10-CM | POA: Insufficient documentation

## 2020-10-07 DIAGNOSIS — J449 Chronic obstructive pulmonary disease, unspecified: Secondary | ICD-10-CM | POA: Diagnosis not present

## 2020-10-07 DIAGNOSIS — Z7951 Long term (current) use of inhaled steroids: Secondary | ICD-10-CM | POA: Insufficient documentation

## 2020-10-07 DIAGNOSIS — I3139 Other pericardial effusion (noninflammatory): Secondary | ICD-10-CM

## 2020-10-07 DIAGNOSIS — N12 Tubulo-interstitial nephritis, not specified as acute or chronic: Secondary | ICD-10-CM | POA: Diagnosis not present

## 2020-10-07 DIAGNOSIS — Z8543 Personal history of malignant neoplasm of ovary: Secondary | ICD-10-CM | POA: Insufficient documentation

## 2020-10-07 DIAGNOSIS — R1030 Lower abdominal pain, unspecified: Secondary | ICD-10-CM | POA: Diagnosis present

## 2020-10-07 LAB — COMPREHENSIVE METABOLIC PANEL
ALT: 17 U/L (ref 0–44)
AST: 21 U/L (ref 15–41)
Albumin: 4.4 g/dL (ref 3.5–5.0)
Alkaline Phosphatase: 33 U/L — ABNORMAL LOW (ref 38–126)
Anion gap: 9 (ref 5–15)
BUN: 29 mg/dL — ABNORMAL HIGH (ref 8–23)
CO2: 33 mmol/L — ABNORMAL HIGH (ref 22–32)
Calcium: 10 mg/dL (ref 8.9–10.3)
Chloride: 98 mmol/L (ref 98–111)
Creatinine, Ser: 0.72 mg/dL (ref 0.44–1.00)
GFR, Estimated: >60 mL/min (ref >60)
Glucose, Bld: 104 mg/dL — ABNORMAL HIGH (ref 70–99)
Potassium: 4 mmol/L (ref 3.5–5.1)
Sodium: 140 mmol/L (ref 135–145)
Total Bilirubin: 0.6 mg/dL (ref 0.3–1.2)
Total Protein: 7.5 g/dL (ref 6.5–8.1)

## 2020-10-07 LAB — CBC
HCT: 33.9 % — ABNORMAL LOW (ref 36.0–46.0)
Hemoglobin: 10.3 g/dL — ABNORMAL LOW (ref 12.0–15.0)
MCH: 27.3 pg (ref 26.0–34.0)
MCHC: 30.4 g/dL (ref 30.0–36.0)
MCV: 89.9 fL (ref 80.0–100.0)
Platelets: 321 10*3/uL (ref 150–400)
RBC: 3.77 MIL/uL — ABNORMAL LOW (ref 3.87–5.11)
RDW: 13.1 % (ref 11.5–15.5)
WBC: 9.8 10*3/uL (ref 4.0–10.5)
nRBC: 0 % (ref 0.0–0.2)

## 2020-10-07 LAB — URINALYSIS, COMPLETE (UACMP) WITH MICROSCOPIC
Bilirubin Urine: NEGATIVE
Glucose, UA: NEGATIVE mg/dL
Ketones, ur: NEGATIVE mg/dL
Nitrite: NEGATIVE
Protein, ur: 30 mg/dL — AB
RBC / HPF: >50 RBC/hpf — ABNORMAL HIGH (ref 0–5)
Specific Gravity, Urine: 1.02 (ref 1.005–1.030)
pH: 7 (ref 5.0–8.0)

## 2020-10-07 LAB — LIPASE, BLOOD: Lipase: 32 U/L (ref 11–51)

## 2020-10-07 MED ORDER — LIDOCAINE 5 % EX PTCH: 1.0000 | MEDICATED_PATCH | Freq: Two times a day (BID) | CUTANEOUS | 0 refills | Status: AC

## 2020-10-07 MED ORDER — AMOXICILLIN 500 MG PO CAPS: 500.0000 mg | ORAL_CAPSULE | Freq: Three times a day (TID) | ORAL | 0 refills | Status: AC

## 2020-10-07 NOTE — Discharge Instructions (Addendum)
We are starting you on some antibiotics for potential UTI that is spread to your kidney.  If your urine culture does grow something different we will let you know.  This could also just be from a kidney stone that had passed but sometimes is difficult to know.  Your CT also showed a small pericardial effusion.  Please call Dr. Bethanne Ginger office tomorrow to get follow-up.  I prescribed some lidocaine patches to put on your back  Return to the ER for fevers, worsening pain or any other concerns   IMPRESSION: 1. Small pericardial effusion. 2. Numerous bilateral subcentimeter non-obstructing renal calculi. 3. Mild right-sided hydronephrosis without evidence of obstructing renal stones. 4. Stable left renal cyst.

## 2020-10-07 NOTE — ED Notes (Signed)
Discharge instructions reviewed with pt and son, pt calm , collective, denied pain or sob

## 2020-10-07 NOTE — ED Provider Notes (Signed)
**Autumn [name] De-Identified via Obfuscation** Autumn Autumn [name]  ____________________________________________   Event Date/Time   First MD Initiated Contact with Patient 10/07/20 1702     (approximate)  I have reviewed the triage vital signs and the nursing notes.   HISTORY  Chief Complaint Abdominal Pain    HPI Autumn Autumn [name] is a 75 y.o. 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  Patient reports previously having shingles and having nerve pain on her stomach.  She states that this pain that she is having is different where its pain in her lower abdomen and wraps around to her back, its been going on for about a month now, nothing makes it better, nothing makes it worse.  Denies any diarrhea or vomiting.  Triage Autumn [name] stated that she had diarrhea but she states that she has not had any diarrhea symptoms for over 2 weeks.  Has not take anything to help the pain nothing makes the pain worse.          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   Chronic use of opiate for therapeutic purpose 12/14/2018   Chronic pain syndrome 12/14/2018   Thoracic radiculopathy 12/14/2018   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   Depression, prolonged 04/07/2018   Acute on chronic respiratory failure with hypoxia [facility])    Palliative care encounter    Protein-calorie  malnutrition, severe 02/06/2018   Acute respiratory failure ([place]) 02/04/2018   COPD (chronic obstructive pulmonary disease) ([place]) 03/22/2017   Post herpetic neuralgia 07/30/2016   Hypercalcemia 02/17/2016   Chronic angle-closure glaucoma of both eyes, severe stage 01/20/2016   Age-related nuclear cataract of both eyes 12/17/2015   Pneumonia 11/17/2015   Long term current use of systemic steroids 02/14/2015   Pulmonary hypertension ([place]) 10/05/2013   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   Abnormal Pap smear of vagina 05/31/2012   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 Authorizing Provider  acetaminophen (TYLENOL) 500 MG tablet Take 500 mg by mouth every 4 (four) hours as needed for mild pain or moderate pain.    [provider]  albuterol (VENTOLIN HFA) 108 (90 Base) MCG/ACT  inhaler INHALE 2 PUFFS BY MOUTH EVERY 6 HOURS AS NEEDED FOR WHEEZE 03/18/20   [provider]  atenolol (TENORMIN) 50 MG tablet Take 50 mg by mouth 2 (two) times daily.     [provider]  atorvastatin (LIPITOR) 10 MG tablet Take 1 tablet by mouth daily.     [provider]  cyanocobalamin 1000 MCG tablet Take by mouth.    [provider]  denosumab (PROLIA) 60 MG/ML SOLN injection Inject 60 mg into the skin every 6 (six) months.  04/27/17   [provider]  fluticasone (FLONASE) 50 MCG/ACT nasal spray Place 2 sprays into both nostrils daily as needed.     [provider]  Fluticasone-Umeclidin-Vilant (TRELEGY ELLIPTA) 100-62.5-25 MCG/INH AEPB Inhale 1 Inhaler into the lungs daily.    [provider]  HYDROcodone-acetaminophen (NORCO) 10-325 MG tablet Take 1 tablet by mouth every 8 (eight) hours as needed for severe pain. Must last 30 days. 09/24/20 10/24/20  Gillis Santa, MD  HYDROcodone-acetaminophen (NORCO) 10-325 MG tablet Take 1 tablet by mouth every 8 (eight) hours as needed for severe pain. Must last 30 days. 10/24/20 11/23/20  Gillis Santa, MD  ipratropium-albuterol (DUONEB) 0.5-2.5 (3) MG/3ML SOLN Take 3 mLs by nebulization every 6 (six) hours as needed. 02/15/18   Gladstone Lighter, MD  linaclotide [facility]) 145 MCG CAPS capsule Take 1 capsule (145 mcg total) by mouth daily before breakfast. As needed. 08/21/20 11/19/20  Gillis Santa, MD  montelukast (SINGULAIR) 10 MG tablet Take 10 mg by mouth at bedtime.    [provider]  polyethylene glycol powder (GLYCOLAX/MIRALAX) 17 GM/SCOOP powder Take by mouth. 09/12/20   [provider]  predniSONE (DELTASONE) 10 MG tablet Take 1 tablet (10 mg total) by mouth daily. Start after finishing the prednisone taper Patient taking differently: Take 5 mg by mouth daily. Start after finishing the prednisone taper 02/28/18   Gladstone Lighter, MD  pregabalin (LYRICA) 50 MG capsule Take 1 capsule (50 mg total) by mouth 3 (three) times daily for 15 days, THEN 1 capsule (50 mg total) 2 (two) times daily for 15 days, THEN 1 capsule (50 mg total) at bedtime. Patient not taking: Reported on 09/17/2020 06/24/20 08/23/20  Gillis Santa, MD  Propylene Glycol 0.6 % SOLN Place 1 drop into both eyes daily as needed (dryness).    [provider]  psyllium (METAMUCIL) 58.6 % packet Take by mouth.    [provider]  sertraline (ZOLOFT) 25 MG tablet Take 25 mg by  mouth daily. 02/16/17   [provider]    Allergies Ivp dye [iodinated diagnostic agents]  Family History  Problem Relation Age of Onset   Breast cancer Mother 20   Cancer Mother    Hypertension Father     Social History Social History   Tobacco Use   Smoking status: Never   Smokeless tobacco: Never  Vaping Use   Vaping Use: Never used  Substance Use Topics   Alcohol use: No   Drug use: No      Review of Systems Constitutional: No fever/chills Eyes: No visual changes. ENT: No sore throat. Cardiovascular: Denies chest pain. Respiratory: Denies shortness of breath. Gastrointestinal: Positive abdominal pain no nausea, no vomiting.  No diarrhea.  No constipation. Genitourinary: Negative for dysuria.  Increased frequency Musculoskeletal: Positive back pain Skin: Negative for rash. Neurological: Negative for headaches, focal weakness or numbness. All other ROS negative ____________________________________________   PHYSICAL EXAM:  VITAL SIGNS: ED Triage Vitals  Enc  Vitals Group     BP 10/07/20 1534 (!) 173/92     Pulse Rate 10/07/20 1534 72     Resp 10/07/20 1534 18     Temp --      Temp src --      SpO2 10/07/20 1534 98 %     Weight --      Height --      Head Circumference --      Peak Flow --      Pain Score 10/07/20 1531 7     Pain Loc --      Pain Edu? --      Excl. in [place]? --     Constitutional: Alert and oriented. Well appearing and in no acute distress. Eyes: Conjunctivae are normal. EOMI. Head: Atraumatic. Nose: No congestion/rhinnorhea. Mouth/Throat: Mucous membranes are moist.   Neck: No stridor. Trachea Midline. FROM Cardiovascular: Normal rate, regular rhythm. Grossly normal heart sounds.  Good peripheral circulation. Respiratory: Normal respiratory effort.  No retractions. Lungs CTAB. Gastrointestinal: Some reported tenderness in the lower abdomen without any rash.  No rebound or guarding.  No distention. No abdominal bruits.   Musculoskeletal: No lower extremity tenderness nor edema.  No joint effusions. Neurologic:  Normal speech and language. No gross focal neurologic deficits are appreciated.  Skin:  Skin is warm, dry and intact. No rash noted. Psychiatric: Mood and affect are normal. Speech and behavior are normal. GU: Deferred   ____________________________________________   LABS (all labs ordered are listed, but only abnormal results are displayed)  Labs Reviewed  COMPREHENSIVE METABOLIC PANEL - Abnormal; Notable for the following components:      Result Value   CO2 33 (*)    Glucose, Bld 104 (*)    BUN 29 (*)    Alkaline Phosphatase 33 (*)    All other components within normal limits  CBC - Abnormal; Notable for the following components:   RBC 3.77 (*)    Hemoglobin 10.3 (*)    HCT 33.9 (*)    All other components within normal limits  LIPASE, BLOOD  URINALYSIS, COMPLETE (UACMP) WITH MICROSCOPIC   ____________________________________________  RADIOLOGY   Official radiology report(s): CT ABDOMEN PELVIS WO CONTRAST  Result Date: 10/07/2020 CLINICAL DATA:  Abdominal pain radiating to the back. EXAM: CT ABDOMEN AND PELVIS WITHOUT CONTRAST TECHNIQUE: Multidetector CT imaging of the abdomen and pelvis was performed following the standard protocol without IV contrast. COMPARISON:  December 15, 2013 FINDINGS: Lower chest: A small pericardial effusion is seen. Mild linear scarring is seen within the bilateral lung bases. Hepatobiliary: No focal liver abnormality is seen. No gallstones, gallbladder wall thickening, or biliary dilatation. Pancreas: Unremarkable. No pancreatic ductal dilatation or surrounding inflammatory changes. Spleen: A small amount of parenchymal calcification is seen within an otherwise normal-appearing spleen. Adrenals/Urinary Tract: Adrenal glands are unremarkable. Kidneys are normal in size. A 1.6 cm x 1.1 cm exophytic cyst is seen along the posterolateral aspect of the mid left  kidney. Multiple clusters of subcentimeter nonobstructing renal stones are seen throughout both kidneys. Mild right-sided hydronephrosis is seen. Bladder is unremarkable. Stomach/Bowel: Stomach is within normal limits. Appendix appears normal. No evidence of bowel wall thickening, distention, or inflammatory changes. Vascular/Lymphatic: Aortic atherosclerosis. No enlarged abdominal or pelvic lymph nodes. Reproductive: Status post hysterectomy. No adnexal masses. Other: No abdominal wall hernia or abnormality. No abdominopelvic ascites. Musculoskeletal: Levoscoliosis of the lumbar spine is seen with multilevel degenerative changes. IMPRESSION: 1. Small pericardial effusion. 2. Numerous bilateral subcentimeter non-obstructing renal  calculi. 3. Mild right-sided hydronephrosis without evidence of obstructing renal stones. 4. Stable left renal cyst. Electronically Signed   By: Virgina Norfolk M.D.   On: 10/07/2020 18:15    ____________________________________________   PROCEDURES  Procedure(s) performed (including Critical Care):  Procedures   ____________________________________________   INITIAL IMPRESSION / ASSESSMENT AND PLAN / ED COURSE  Autumn Autumn [name] was evaluated in Emergency Department on 10/07/2020 for the symptoms described in the history of present illness. She was evaluated in the context of the global COVID-19 pandemic, which necessitated consideration that the patient might be at risk for infection with the SARS-CoV-2 virus that causes COVID-19. Institutional protocols and algorithms that pertain to the evaluation of patients at risk for COVID-19 are in a state of rapid change based on information released by regulatory bodies including the CDC and federal and state organizations. These policies and algorithms were followed during the patient's care in the ED.    Patient comes in with hypertension concern for abdominal pain so sent here for further work-up.  On discussion with patient's  she denies any diarrhea for over 2 weeks but she states that she has pain in her abdomen for over 1 month.  She has a allergy to contrast but given this pains been going on for 1 month I do not feel like a CT with contrast is really needed given my low suspicion for aneurysm rupture.  She is very well-appearing on exam and although she is reporting pain she is not significantly tender and she has no rebound or guarding upon my examination.  Labs ordered to evaluate for Electra abnormalities, AKI, CT ordered to evaluate for appendicitis, SBO, kidney stone, urine to evaluate for UTI given she does report increased frequency   Labs are reassuring but CT scan shows mild right-sided hydronephrosis without evidence of obstructing renal stone.  She does have some small stones in her kidneys so she could have had a stone that passed but given her urine prior UTIs I am concerned about the possibility of pyelonephritis.  She is afebrile with normal white count and very well-appearing but I did discuss her prior urine cultures with the pharmacist and we will start a course of amoxicillin for 10 days to cover potential pyelonephritis from Enterococcus which is what she grew last time.  The CT scan did incidentally show a small pericardial effusion.  She denies any chest pain, shortness of breath worsening from her baseline.  Her blood pressure is slightly hypertensive but she is not tachycardic.  I discussed that she should call Dr. Ubaldo Glassing to get follow-up for this but at this time I do not feel like she is in tamponade requiring any immediate intervention.  Did discuss this with both patient and family at bedside that if her symptoms are worsening that she needs to return to the ER immediately because this can be life-threatening.  They expressed understanding and felt comfortable with going home  I discussed the provisional nature of ED diagnosis, the treatment so far, the ongoing plan of care, follow up appointments and  return precautions with the patient and any family or support people present. They expressed understanding and agreed with the plan, discharged home.         ____________________________________________   FINAL CLINICAL IMPRESSION(S) / ED DIAGNOSES   Final diagnoses:  Pyelonephritis  Pericardial effusion      MEDICATIONS GIVEN DURING THIS VISIT:  Medications - No data to display   ED Discharge Orders  None        Autumn [name]:  This document was prepared using Dragon voice recognition software and may include unintentional dictation errors.    Vanessa Rachel, MD 10/07/20 Pauline Aus

## 2020-10-07 NOTE — ED Notes (Signed)
See triage note  Presents with abd pain  States pain is radiating into back   No fever or n/v but has had some diarrhea

## 2020-10-07 NOTE — ED Triage Notes (Signed)
Pt comes from [facility] with c/o abdominal pain and back pain. Pt states this has been going on for awhile. Pt states nerve pain as well.  Pt denies any diarrhea. Pt denies any vomiting.

## 2020-10-10 LAB — URINE CULTURE: Culture: 30000 — AB

## 2020-10-11 NOTE — Progress Notes (Addendum)
ED Antimicrobial Stewardship Positive Culture Follow Up   Autumn [name] is an 75 y.o. female who presented to [facility] on 10/07/2020 with a chief complaint of  Chief Complaint  Patient presents with   Abdominal Pain    Recent Results (from the past 720 hour(s))  Urine culture     Status: Abnormal   Collection Time: 10/07/20  5:14 PM   Specimen: Urine, Random  Result Value Ref Range Status   Specimen Description   Final    URINE, RANDOM Performed at [facility], [address] [place], [place] [address]    Special Requests   Final    NONE Performed at [facility], [place], [place] [address]    Culture 30,000 COLONIES/mL PSEUDOMONAS AERUGINOSA (A)  Final   Report Status 10/10/2020 FINAL  Final   Organism ID, Bacteria PSEUDOMONAS AERUGINOSA (A)  Final      Susceptibility   Pseudomonas aeruginosa - MIC*    CEFTAZIDIME 4 SENSITIVE Sensitive     CIPROFLOXACIN <=0.25 SENSITIVE Sensitive     GENTAMICIN 2 SENSITIVE Sensitive     IMIPENEM 2 SENSITIVE Sensitive     PIP/TAZO 8 SENSITIVE Sensitive     CEFEPIME 2 SENSITIVE Sensitive     * 30,000 COLONIES/mL PSEUDOMONAS AERUGINOSA    [x]  Treated with amoxicillin, organism resistant to prescribed antimicrobial  New antibiotic prescription: Ciprofloxacin 500 mg BID x 5 days  ED Provider: Dr. Earle Gell 10/11/2020, 1:02 PM

## 2020-10-14 ENCOUNTER — Other Ambulatory Visit (HOSPITAL_COMMUNITY): Payer: Self-pay | Admitting: Gastroenterology

## 2020-10-14 ENCOUNTER — Other Ambulatory Visit: Payer: Self-pay | Admitting: Gastroenterology

## 2020-10-14 DIAGNOSIS — R634 Abnormal weight loss: Secondary | ICD-10-CM

## 2020-10-14 DIAGNOSIS — R1013 Epigastric pain: Secondary | ICD-10-CM

## 2020-10-14 DIAGNOSIS — R110 Nausea: Secondary | ICD-10-CM

## 2020-10-14 DIAGNOSIS — R1031 Right lower quadrant pain: Secondary | ICD-10-CM

## 2020-10-14 DIAGNOSIS — K5903 Drug induced constipation: Secondary | ICD-10-CM

## 2020-10-22 ENCOUNTER — Ambulatory Visit
Admission: RE | Admit: 2020-10-22 | Discharge: 2020-10-22 | Disposition: A | Discharge status: Home / Self Care | Payer: Medicare PPO | Source: Ambulatory Visit | Attending: Gastroenterology | Admitting: Gastroenterology

## 2020-10-22 ENCOUNTER — Other Ambulatory Visit: Payer: Self-pay

## 2020-10-22 DIAGNOSIS — R1032 Left lower quadrant pain: Secondary | ICD-10-CM | POA: Diagnosis present

## 2020-10-22 DIAGNOSIS — R1031 Right lower quadrant pain: Secondary | ICD-10-CM

## 2020-10-22 DIAGNOSIS — K5903 Drug induced constipation: Secondary | ICD-10-CM | POA: Diagnosis present

## 2020-10-22 DIAGNOSIS — R110 Nausea: Secondary | ICD-10-CM | POA: Diagnosis not present

## 2020-10-22 DIAGNOSIS — R634 Abnormal weight loss: Secondary | ICD-10-CM | POA: Diagnosis present

## 2020-10-22 DIAGNOSIS — R1013 Epigastric pain: Secondary | ICD-10-CM | POA: Insufficient documentation

## 2020-11-06 ENCOUNTER — Encounter: Payer: Self-pay | Admitting: Podiatry

## 2020-11-06 ENCOUNTER — Ambulatory Visit: Payer: Medicare PPO | Admitting: Podiatry

## 2020-11-06 ENCOUNTER — Other Ambulatory Visit: Payer: Self-pay

## 2020-11-06 DIAGNOSIS — B0229 Other postherpetic nervous system involvement: Secondary | ICD-10-CM

## 2020-11-06 DIAGNOSIS — M79675 Pain in left toe(s): Secondary | ICD-10-CM

## 2020-11-06 DIAGNOSIS — M79674 Pain in right toe(s): Secondary | ICD-10-CM | POA: Diagnosis not present

## 2020-11-06 DIAGNOSIS — B351 Tinea unguium: Secondary | ICD-10-CM | POA: Diagnosis not present

## 2020-11-06 NOTE — Progress Notes (Signed)
This patient returns to my office for at risk foot care.  This patient requires this care by a professional since this patient will be at risk due to having post herpetic neuralgia. This patient is unable to cut nails herself since the patient cannot reach her nails.These nails are painful walking and wearing shoes.  This patient presents for at risk foot care today.  General Appearance  Alert, conversant and in no acute stress.  Vascular  Dorsalis pedis  are  weakly palpable  bilaterally. Posterior tibial pulses are absent  Bilateral. Capillary return is within normal limits  bilaterally. Cold feet.   Bilaterally. Absent digital hair  Bilateral.  Neurologic  Senn-Weinstein monofilament wire test within normal limits  bilaterally. Muscle power within normal limits bilaterally.  Nails Thick disfigured discolored nails with subungual debris  Hallux nails  bilaterally. No evidence of bacterial infection or drainage bilaterally.  Orthopedic  No limitations of motion  feet .  No crepitus or effusions noted.  No bony pathology or digital deformities noted.  Skin  normotropic skin with no porokeratosis noted bilaterally.  No signs of infections or ulcers noted.     Onychomycosis  Pain in right toes  Pain in left toes  Consent was obtained for treatment procedures.   Mechanical debridement of nails 1-5  bilaterally performed with a nail nipper.  Filed with dremel without incident.    Return office visit  3 months                    Told patient to return for periodic foot care and evaluation due to potential at risk complications.   Gardiner Barefoot DPM

## 2020-11-11 ENCOUNTER — Other Ambulatory Visit: Payer: Self-pay

## 2020-11-11 ENCOUNTER — Encounter: Payer: Self-pay | Admitting: Student in an Organized Health Care Education/Training Program

## 2020-11-11 ENCOUNTER — Ambulatory Visit
Payer: Medicare PPO | Attending: Student in an Organized Health Care Education/Training Program | Admitting: Student in an Organized Health Care Education/Training Program

## 2020-11-11 VITALS — BP 154/70 | HR 71 | Temp 97.6°F | Resp 14 | Ht 68.0 in | Wt 121.0 lb

## 2020-11-11 DIAGNOSIS — B0229 Other postherpetic nervous system involvement: Secondary | ICD-10-CM | POA: Insufficient documentation

## 2020-11-11 DIAGNOSIS — M47894 Other spondylosis, thoracic region: Secondary | ICD-10-CM | POA: Insufficient documentation

## 2020-11-11 DIAGNOSIS — G894 Chronic pain syndrome: Secondary | ICD-10-CM | POA: Diagnosis present

## 2020-11-11 DIAGNOSIS — M47816 Spondylosis without myelopathy or radiculopathy, lumbar region: Secondary | ICD-10-CM | POA: Diagnosis present

## 2020-11-11 DIAGNOSIS — M792 Neuralgia and neuritis, unspecified: Secondary | ICD-10-CM | POA: Insufficient documentation

## 2020-11-11 MED ORDER — HYDROCODONE-ACETAMINOPHEN 10-325 MG PO TABS: 1.0000 | ORAL_TABLET | Freq: Two times a day (BID) | ORAL | 0 refills | Status: AC | PRN

## 2020-11-11 MED ORDER — HYDROCODONE-ACETAMINOPHEN 10-325 MG PO TABS: 1.0000 | ORAL_TABLET | Freq: Two times a day (BID) | ORAL | 0 refills | Status: DC | PRN

## 2020-11-11 NOTE — Progress Notes (Signed)
PROVIDER NOTE: Information contained herein reflects review and annotations entered in association with encounter. Interpretation of such information and data should be left to medically-trained personnel. Information provided to patient can be located elsewhere in the medical record under "Patient Instructions". Document created using STT-dictation technology, any transcriptional errors that may result from process are unintentional.    Patient: Autumn [name]  Service Category: E/M  Provider: Gillis Santa, MD  DOB: Nov 07, 1945  DOS: 11/11/2020  Specialty: Interventional Pain Management  MRN: [id number]  Setting: Ambulatory outpatient  PCP: Baxter Hire, MD  Type: Established Patient    Referring Provider: Baxter Hire, MD  Location: Office  Delivery: Face-to-face     HPI  Autumn [name], a 75 y.o. year old female, is here today because of her Post herpetic neuralgia [B02.29]. [name] primary complain today is Chest Pain (Post herpatic neuralgia) Last encounter: My last encounter with her was on 09/18/20 Pertinent problems: [name] has COPD (chronic obstructive pulmonary disease) ([place]); Post herpetic neuralgia; Pulmonary sarcoidosis ([place]); Chronic pain syndrome; Thoracic radiculopathy; and Spondylosis without myelopathy or radiculopathy, lumbar region on their pertinent problem list. Pain Assessment: Severity of Chronic pain is reported as a 6 /10. Location: Chest Left/n/a. Onset:  . Quality: Burning, Other (Comment) (stinging). Timing: Constant. Modifying factor(s): medication, cream. Vitals:  height is 5' 8" (1.727 m) and weight is 121 lb (54.9 kg). Her temporal temperature is 97.6 F (36.4 C). Her blood pressure is 154/70 (abnormal) and her pulse is 71. Her respiration is 14 and oxygen saturation is 100%.   Reason for encounter: medication management.    Patient had an upper endoscopy performed.  It was normal. She is interested in reducing her hydrocodone intake to reflect her  actual intake which is usually 1 tablet in the morning and 1 tablet in the evening. We will refill as below. She did not find any benefit with Lyrica so she discontinued it.  Pharmacotherapy Assessment    Analgesic: Hydrocodone 10 mg 2 times daily as needed, quantity 60/month; MME equals 20  Monitoring: [place] PMP: PDMP reviewed during this encounter.       Pharmacotherapy: No side-effects or adverse reactions reported. Compliance: No problems identified. Effectiveness: Clinically acceptable.  [name], RN  11/11/2020  2:09 PM  Sign when Signing Visit Nursing Pain Medication Assessment:  Safety precautions to be maintained throughout the outpatient stay will include: orient to surroundings, keep bed in low position, maintain call bell within reach at all times, provide assistance with transfer out of bed and ambulation.  Medication Inspection Compliance: [name] did not comply with our request to bring her pills to be counted. She was reminded that bringing the medication bottles, even when empty, is a requirement.  Medication: None brought in. Pill/Patch Count: None available to be counted. Bottle Appearance: No container available. Did not bring bottle(s) to appointment. Filled Date: N/A Last Medication intake:  2 days ago      UDS:  Summary  Date Value Ref Range Status  04/01/2020 Note  Final    Comment:    ==================================================================== [facility] (MW) ==================================================================== Test                             Result       Flag       Units  Drug Present and Declared for Prescription Verification   Hydrocodone  658          EXPECTED   ng/mg creat   Hydromorphone                  39           EXPECTED   ng/mg creat   Dihydrocodeine                 101          EXPECTED   ng/mg creat   Norhydrocodone                 1868         EXPECTED   ng/mg creat    Sources  of hydrocodone include scheduled prescription medications.    Hydromorphone, dihydrocodeine and norhydrocodone are expected    metabolites of hydrocodone. Hydromorphone and dihydrocodeine are    also available as scheduled prescription medications.  ==================================================================== Test                      Result    Flag   Units      Ref Range   Creatinine              154              mg/dL      >=20 ==================================================================== Declared Medications:  The flagging and interpretation on this report are based on the  following declared medications.  Unexpected results may arise from  inaccuracies in the declared medications.   **Note: The testing scope of this panel includes these medications:   Hydrocodone   **Note: The testing scope of this panel does not include the  following reported medications:   Acetaminophen  Albuterol  Albuterol (Duoneb)  Atenolol  Atorvastatin  Calcium  Cyanocobalamin  Denosumab  Fish Oil  Fluticasone  Fluticasone (Trelegy)  Ipratropium (Duoneb)  Meloxicam (Mobic)  Montelukast (Singulair)  Polyethylene Glycol  Prednisone  Pregabalin  Psyllium  Sertraline  Umeclidinium (Trelegy)  Vilanterol (Trelegy)  Vitamin D3 ==================================================================== For clinical consultation, please call [phone]. ====================================================================      ROS  Constitutional: Denies any fever or chills Gastrointestinal: No reported hemesis, hematochezia, vomiting, or acute GI distress Musculoskeletal:  Thoracic mid back pain Neurological: No reported episodes of acute onset apraxia, aphasia, dysarthria, agnosia, amnesia, paralysis, loss of coordination, or loss of consciousness  Medication Review  Fluticasone-Umeclidin-Vilant, HYDROcodone-acetaminophen, Propylene Glycol, acetaminophen, albuterol, amLODipine,  atenolol, atorvastatin, denosumab, fluticasone, ipratropium-albuterol, montelukast, polyethylene glycol powder, predniSONE, and sertraline  History Review  Allergy: Ms. Miano is allergic to ivp dye [iodinated diagnostic agents]. Drug: Ms. Mountz  reports no history of drug use. Alcohol:  reports no history of alcohol use. Tobacco:  reports that she has never smoked. She has never used smokeless tobacco. Social: Ms. Counts  reports that she has never smoked. She has never used smokeless tobacco. She reports that she does not drink alcohol and does not use drugs. Medical:  has a past medical history of Allergy, Anemia, Arthritis, COPD (chronic obstructive pulmonary disease) (HCC), Glaucoma, H/O uvulectomy, Hypertension, Osteoporosis, Ovarian cancer (HCC), Postherpetic neuralgia, Pulmonary sarcoidosis (HCC), and Sarcoid. Surgical: Ms. Ellingsen  has a past surgical history that includes Abdominal hysterectomy; Dilation and curettage of uterus; Vulvar lesion removal; Diagnostic laparoscopy; Eye surgery; Colonoscopy with propofol (N/A, 07/30/2019); and Breast excisional biopsy (Right). Family: family history includes Breast cancer (age of onset: 70) in her mother; Cancer in her mother; Hypertension in her father.  Laboratory Chemistry   Profile   Renal Lab Results  Component Value Date   BUN 29 (H) 10/07/2020   CREATININE 0.72 10/07/2020   GFRAA >60 02/13/2018   GFRNONAA >60 10/07/2020     Hepatic Lab Results  Component Value Date   AST 21 10/07/2020   ALT 17 10/07/2020   ALBUMIN 4.4 10/07/2020   ALKPHOS 33 (L) 10/07/2020   LIPASE 32 10/07/2020     Electrolytes Lab Results  Component Value Date   NA 140 10/07/2020   K 4.0 10/07/2020   CL 98 10/07/2020   CALCIUM 10.0 10/07/2020   MG 2.7 (H) 02/07/2018   PHOS 3.2 02/07/2018     Bone No results found for: VD25OH, VD125OH2TOT, VD3125OH2, VD2125OH2, 25OHVITD1, 25OHVITD2, 25OHVITD3, TESTOFREE, TESTOSTERONE   Inflammation (CRP: Acute  Phase) (ESR: Chronic Phase) Lab Results  Component Value Date   ESRSEDRATE 48 (H) 05/25/2011       Note: Above Lab results reviewed.  Recent Imaging Review  DG UGI W SMALL BOWEL CLINICAL DATA:  Chronic nausea, epigastric pain, lower abdominal pain  EXAM: UPPER GI SERIES WITH SMALL BOWEL FOLLOW-THROUGH  FLUOROSCOPY TIME:  Fluoroscopy Time:  1 minutes 12 seconds  Radiation Exposure Index (if provided by the fluoroscopic device): 26.5 mGy  TECHNIQUE: Combined double contrast and single contrast upper GI series using effervescent crystals, thick barium, and thin barium. Subsequently, serial images of the small bowel were obtained including spot views of the terminal ileum.  COMPARISON:  None.  FINDINGS: Scout radiograph of the abdomen demonstrates normal bowel gas pattern. Levocurvature of the lumbar spine with degenerative changes.  Patient swallowed barium without difficulty. Normal hypopharyngeal contours. No aspiration. Esophageal motility is unremarkable for age. There is no esophageal mass or stricture. Stomach is unremarkable. No hiatal hernia. No spontaneous gastroesophageal reflux.  There is no delay in gastric emptying. Contrast enters the cecum by 25 minutes. Majority of small bowel loops are within the pelvis and difficult to separate, which may reflect adhesions from prior pelvic surgery. Terminal ileum is suboptimally evaluated due to underdistention.  IMPRESSION: No mass or stricture.  Question adhesions due to prior pelvic surgery. However, no delay in contrast passage. Contrast reaches the cecum by 25 minutes.  Electronically Signed   By: Praneil  Patel M.D.   On: 10/22/2020 12:03 Note: Reviewed        Physical Exam  General appearance: Well nourished, well developed, and well hydrated. In no apparent acute distress Mental status: Alert, oriented x 3 (person, place, & time)       Respiratory: No evidence of acute respiratory distress Eyes:  PERLA Vitals: BP (!) 154/70   Pulse 71   Temp 97.6 F (36.4 C) (Temporal)   Resp 14   Ht 5' 8" (1.727 m)   Wt 121 lb (54.9 kg)   SpO2 100% Comment: O2 at 2L  BMI 18.40 kg/m  BMI: Estimated body mass index is 18.4 kg/m as calculated from the following:   Height as of this encounter: 5' 8" (1.727 m).   Weight as of this encounter: 121 lb (54.9 kg). Ideal: Ideal body weight: 63.9 kg (140 lb 14 oz)  Lumbar Spine Area Exam  Skin & Axial Inspection: No masses, redness, or swelling Alignment: Levoscoliosis Functional ROM: Pain restricted ROM affecting primarily the left Stability: No instability detected Muscle Tone/Strength: Functionally intact. No obvious neuro-muscular anomalies detected. Sensory (Neurological): Articular pain pattern Palpation: No palpable anomalies       Provocative Tests: Hyperextension/rotation test: Positive bilaterally for facet joint pain.,    Pain with facet loading Lumbar quadrant test (Kemp's test): (+) bilaterally for facet joint pain. Lateral bending test: (+) due to pain.  Gait & Posture Assessment  Ambulation: Limited Gait: Limited. Using assistive device to ambulate Posture: Difficulty standing up straight, due to pain   Lower Extremity Exam      Side: Right lower extremity   Side: Left lower extremity  Stability: No instability observed           Stability: No instability observed          Skin & Extremity Inspection: Skin color, temperature, and hair growth are WNL. No peripheral edema or cyanosis. No masses, redness, swelling, asymmetry, or associated skin lesions. No contractures.   Skin & Extremity Inspection: Skin color, temperature, and hair growth are WNL. No peripheral edema or cyanosis. No masses, redness, swelling, asymmetry, or associated skin lesions. No contractures.  Functional ROM: Pain restricted ROM for all joints of the lower extremity           Functional ROM: Pain restricted ROM for all joints of the lower extremity           Muscle Tone/Strength: Functionally intact. No obvious neuro-muscular anomalies detected.   Muscle Tone/Strength: Functionally intact. No obvious neuro-muscular anomalies detected.  Sensory (Neurological): Unimpaired         Sensory (Neurological): Unimpaired        DTR: Patellar: deferred today Achilles: deferred today Plantar: deferred today   DTR: Patellar: deferred today Achilles: deferred today Plantar: deferred today  Palpation: No palpable anomalies   Palpation: No palpable anomalies    Assessment   Status Diagnosis  Controlled Controlled Controlled 1. Post herpetic neuralgia   2. Neuropathic pain   3. Lumbar facet arthropathy   4. Spondylosis without myelopathy or radiculopathy, lumbar region   5. Thoracic facet joint syndrome   6. Postinfective intercostal neuralgia   7. Chronic pain syndrome        Plan of Care  Ms. Kissie I Harling has a current medication list which includes the following long-term medication(s): amlodipine, atenolol, atorvastatin, fluticasone, ipratropium-albuterol, montelukast, and sertraline.  Pharmacotherapy (Medications Ordered): Meds ordered this encounter  Medications   HYDROcodone-acetaminophen (NORCO) 10-325 MG tablet    Sig: Take 1 tablet by mouth every 12 (twelve) hours as needed for severe pain. Must last 30 days.    Dispense:  60 tablet    Refill:  0    Chronic Pain. (STOP Act - Not applicable). Fill one day early if closed on scheduled refill date.   HYDROcodone-acetaminophen (NORCO) 10-325 MG tablet    Sig: Take 1 tablet by mouth every 12 (twelve) hours as needed for severe pain. Must last 30 days.    Dispense:  60 tablet    Refill:  0    Chronic Pain. (STOP Act - Not applicable). Fill one day early if closed on scheduled refill date.   HYDROcodone-acetaminophen (NORCO) 10-325 MG tablet    Sig: Take 1 tablet by mouth every 12 (twelve) hours as needed for severe pain. Must last 30 days.    Dispense:  60 tablet    Refill:  0     Chronic Pain. (STOP Act - Not applicable). Fill one day early if closed on scheduled refill date.   Follow-up plan:   Return in about 3 months (around 02/11/2021) for Medication Management, in person.    Recent Visits Date Type Provider Dept  09/18/20 Telemedicine Lateef, Bilal, MD [facility]  08/21/20 Office Visit Lateef, Bilal,   MD [facility]  Showing recent visits within past 90 days and meeting all other requirements Today's Visits Date Type Provider Dept  11/11/20 Office Visit Gillis Santa, MD [facility]  Showing today's visits and meeting all other requirements Future Appointments Date Type Provider Dept  02/05/21 Appointment Gillis Santa, MD [facility]  Showing future appointments within next 90 days and meeting all other requirements I discussed the assessment and treatment plan with the patient. The patient was provided an opportunity to ask questions and all were answered. The patient agreed with the plan and demonstrated an understanding of the instructions.  Patient advised to call back or seek an in-person evaluation if the symptoms or condition worsens.  Duration of encounter: 30 minutes.  Note by: Gillis Santa, MD Date: 11/11/2020; Time: 2:44 PM

## 2020-11-11 NOTE — Progress Notes (Signed)
Nursing Pain Medication Assessment:  Safety precautions to be maintained throughout the outpatient stay will include: orient to surroundings, keep bed in low position, maintain call bell within reach at all times, provide assistance with transfer out of bed and ambulation.  Medication Inspection Compliance: Autumn [name] did not comply with our request to bring her pills to be counted. She was reminded that bringing the medication bottles, even when empty, is a requirement.  Medication: None brought in. Pill/Patch Count: None available to be counted. Bottle Appearance: No container available. Did not bring bottle(s) to appointment. Filled Date: N/A Last Medication intake:  2 days ago

## 2020-11-13 ENCOUNTER — Encounter: Payer: Medicare PPO | Admitting: Student in an Organized Health Care Education/Training Program

## 2020-11-17 ENCOUNTER — Other Ambulatory Visit: Payer: Self-pay | Admitting: Student in an Organized Health Care Education/Training Program

## 2021-01-21 ENCOUNTER — Telehealth: Payer: Self-pay

## 2021-01-21 DIAGNOSIS — B0229 Other postherpetic nervous system involvement: Secondary | ICD-10-CM

## 2021-01-21 DIAGNOSIS — G894 Chronic pain syndrome: Secondary | ICD-10-CM

## 2021-01-21 MED ORDER — HYDROCODONE-ACETAMINOPHEN 10-325 MG PO TABS: 1.0000 | ORAL_TABLET | Freq: Two times a day (BID) | ORAL | 0 refills | Status: DC | PRN

## 2021-01-21 NOTE — Telephone Encounter (Signed)
Her current pharmacy has closed. Will you please send her last prescription for Hydrocodone, dated 01/10/21, to [facility] in [place]? I have added it to her profile.

## 2021-01-21 NOTE — Telephone Encounter (Signed)
Pt asks one of the nurses to give her a call pt states she never got her meds since [facility] closed down

## 2021-02-05 ENCOUNTER — Encounter: Payer: Medicare PPO | Admitting: Student in an Organized Health Care Education/Training Program

## 2021-02-10 ENCOUNTER — Other Ambulatory Visit: Payer: Self-pay

## 2021-02-10 ENCOUNTER — Encounter: Payer: Self-pay | Admitting: Student in an Organized Health Care Education/Training Program

## 2021-02-10 ENCOUNTER — Ambulatory Visit
Payer: Medicare PPO | Attending: Student in an Organized Health Care Education/Training Program | Admitting: Student in an Organized Health Care Education/Training Program

## 2021-02-10 DIAGNOSIS — G894 Chronic pain syndrome: Secondary | ICD-10-CM

## 2021-02-10 DIAGNOSIS — M47894 Other spondylosis, thoracic region: Secondary | ICD-10-CM | POA: Diagnosis not present

## 2021-02-10 DIAGNOSIS — B0229 Other postherpetic nervous system involvement: Secondary | ICD-10-CM

## 2021-02-10 DIAGNOSIS — M792 Neuralgia and neuritis, unspecified: Secondary | ICD-10-CM | POA: Diagnosis not present

## 2021-02-10 DIAGNOSIS — M47816 Spondylosis without myelopathy or radiculopathy, lumbar region: Secondary | ICD-10-CM | POA: Diagnosis not present

## 2021-02-10 MED ORDER — HYDROCODONE-ACETAMINOPHEN 10-325 MG PO TABS: 1.0000 | ORAL_TABLET | Freq: Two times a day (BID) | ORAL | 0 refills | Status: AC | PRN

## 2021-02-10 MED ORDER — HYDROCODONE-ACETAMINOPHEN 10-325 MG PO TABS: 1.0000 | ORAL_TABLET | Freq: Two times a day (BID) | ORAL | 0 refills | Status: DC | PRN

## 2021-02-10 NOTE — Progress Notes (Signed)
Patient: Autumn [name]  Service Category: E/M  Provider: Gillis Santa, MD  DOB: 1945/10/11  DOS: 02/10/2021  Location: Office  MRN: [id number]  Setting: Ambulatory outpatient  Referring Provider: Baxter Hire, MD  Type: Established Patient  Specialty: Interventional Pain Management  PCP: Baxter Hire, MD  Location: Remote location  Delivery: TeleHealth     Virtual Encounter - Pain Management PROVIDER NOTE: Information contained herein reflects review and annotations entered in association with encounter. Interpretation of such information and data should be left to medically-trained personnel. Information provided to patient can be located elsewhere in the medical record under "Patient Instructions". Document created using STT-dictation technology, any transcriptional errors that may result from process are unintentional.    Contact & Pharmacy Preferred: [phone] Home: [phone] (home) Mobile: [phone] (mobile) E-mail: [email or web address]  [id number] [place] [facility] [address] [id number]: [phone]: [phone] [id number] [place] [id number]: [phone]: [phone] Pre-screening  [name] "in-person" vs "virtual" encounter. She indicated preferring virtual for this encounter.   Reason COVID-19*  Social distancing based on CDC and AMA recommendations.   I contacted HaGwenith Spitzn 02/10/2021 via telephone.      I clearly identified myself as BiGillis SantaMD. I verified that I was speaking with the correct person using two identifiers (Name: Autumn MUNDOand date of birth: [birth year]  Consent I sought verbal advanced consent from HaGwenith Spitzor virtual visit interactions. I informed [name] possible security and privacy concerns, risks, and limitations associated with providing "not-in-person" medical  evaluation and management services. I also informed [name] the availability of "in-person" appointments. Finally, I informed her that there would be a charge for the virtual visit and that she could be  personally, fully or partially, financially responsible for it. [name] understanding and agreed to proceed.   Historic Elements   Ms. Autumn BIGGSs a [age].o. year old, female patient evaluated today after our last contact on 11/17/2020. Ms. CaBuschmanhas a past medical history of Allergy, Anemia, Arthritis, COPD (chronic obstructive pulmonary disease) ([place] Glaucoma, H/O uvulectomy, Hypertension, Osteoporosis, Ovarian cancer ([place] Postherpetic neuralgia, Pulmonary sarcoidosis ([facility] and Sarcoid. She also  has a past surgical history that includes Abdominal hysterectomy; Dilation and curettage of uterus; Vulvar lesion removal; Diagnostic laparoscopy; Eye surgery; Colonoscopy with propofol (N/A, 07/30/2019); and Breast excisional biopsy (Right). Ms. CaPressmanas a current medication list which includes the following prescription(s): acetaminophen, albuterol, amlodipine, atenolol, atorvastatin, denosumab, fluticasone, trelegy ellipta, [START ON 02/20/2021] hydrocodone-acetaminophen, [START ON 03/22/2021] hydrocodone-acetaminophen, [START ON 04/21/2021] hydrocodone-acetaminophen, ipratropium-albuterol, montelukast, polyethylene glycol powder, prednisone, propylene glycol, and sertraline. She  reports that she has never smoked. She has never used smokeless tobacco. She reports that she does not drink alcohol and does not use drugs. [name] allergic to ivp dye [iodinated diagnostic agents].   HPI  Today, she is being contacted for medication management.  Patient recently started nortriptyline per Dr. PoMelrose Nakayamaith neurology.  She is taking 40 mg daily.  Is not noticing any benefit.  Recommend that she follow-up with Dr. PoMelrose Nakayamaegarding that.  Otherwise we will refill her hydrocodone as  below, patient takes 10 mg twice daily as needed.  No change in dose.   Hydrocodone 10 mg twice daily as needed, quantity  60/month; MME equals 20   Monitoring: [place] PMP: PDMP not reviewed this encounter.       Pharmacotherapy: No side-effects or adverse reactions reported. Compliance: No problems identified. Effectiveness: Clinically acceptable. Plan: Refer to "POC". UDS:  Summary  Date Value Ref Range Status  04/01/2020 Note  Final    Comment:    ==================================================================== ToxASSURE Select 13 (MW) ==================================================================== Test                             Result       Flag       Units  Drug Present and Declared for Prescription Verification   Hydrocodone                    658          EXPECTED   ng/mg creat   Hydromorphone                  39           EXPECTED   ng/mg creat   Dihydrocodeine                 101          EXPECTED   ng/mg creat   Norhydrocodone                 1868         EXPECTED   ng/mg creat    Sources of hydrocodone include scheduled prescription medications.    Hydromorphone, dihydrocodeine and norhydrocodone are expected    metabolites of hydrocodone. Hydromorphone and dihydrocodeine are    also available as scheduled prescription medications.  ==================================================================== Test                      Result    Flag   Units      Ref Range   Creatinine              154              mg/dL      >=20 ==================================================================== Declared Medications:  The flagging and interpretation on this report are based on the  following declared medications.  Unexpected results may arise from  inaccuracies in the declared medications.   **Note: The testing scope of this panel includes these medications:   Hydrocodone   **Note: The testing scope of this panel does not include the  following reported  medications:   Acetaminophen  Albuterol  Albuterol (Duoneb)  Atenolol  Atorvastatin  Calcium  Cyanocobalamin  Denosumab  Fish Oil  Fluticasone  Fluticasone (Trelegy)  Ipratropium (Duoneb)  Meloxicam (Mobic)  Montelukast (Singulair)  Polyethylene Glycol  Prednisone  Pregabalin  Psyllium  Sertraline  Umeclidinium (Trelegy)  Vilanterol (Trelegy)  Vitamin D3 ==================================================================== For clinical consultation, please call [phone]. ====================================================================      Laboratory Chemistry Profile   Renal Lab Results  Component Value Date   BUN 29 (H) 10/07/2020   CREATININE 0.72 10/07/2020   GFRAA >60 02/13/2018   GFRNONAA >60 10/07/2020    Hepatic Lab Results  Component Value Date   AST 21 10/07/2020   ALT 17 10/07/2020   ALBUMIN 4.4 10/07/2020   ALKPHOS 33 (L) 10/07/2020   LIPASE 32 10/07/2020    Electrolytes Lab Results  Component Value Date   NA 140 10/07/2020   K 4.0 10/07/2020   CL 98 10/07/2020  CALCIUM 10.0 10/07/2020   MG 2.7 (H) 02/07/2018   PHOS 3.2 02/07/2018    Bone No results found for: VD25OH, [id number], [id number], EX9371IR6, 25OHVITD1, 25OHVITD2, 25OHVITD3, TESTOFREE, TESTOSTERONE  Inflammation (CRP: Acute Phase) (ESR: Chronic Phase) Lab Results  Component Value Date   ESRSEDRATE 48 (H) 05/25/2011         Note: Above Lab results reviewed.  Imaging  DG UGI W SMALL BOWEL CLINICAL DATA:  Chronic nausea, epigastric pain, lower abdominal pain  EXAM: UPPER GI SERIES WITH SMALL BOWEL FOLLOW-THROUGH  FLUOROSCOPY TIME:  Fluoroscopy Time:  1 minutes 12 seconds  Radiation Exposure Index (if provided by the fluoroscopic device): 26.5 mGy  TECHNIQUE: Combined double contrast and single contrast upper GI series using effervescent crystals, thick barium, and thin barium. Subsequently, serial images of the small bowel were obtained including spot  views of the terminal ileum.  COMPARISON:  None.  FINDINGS: Scout radiograph of the abdomen demonstrates normal bowel gas pattern. Levocurvature of the lumbar spine with degenerative changes.  Patient swallowed barium without difficulty. Normal hypopharyngeal contours. No aspiration. Esophageal motility is unremarkable for age. There is no esophageal mass or stricture. Stomach is unremarkable. No hiatal hernia. No spontaneous gastroesophageal reflux.  There is no delay in gastric emptying. Contrast enters the cecum by 25 minutes. Majority of small bowel loops are within the pelvis and difficult to separate, which may reflect adhesions from prior pelvic surgery. Terminal ileum is suboptimally evaluated due to underdistention.  IMPRESSION: No mass or stricture.  Question adhesions due to prior pelvic surgery. However, no delay in contrast passage. Contrast reaches the cecum by 25 minutes.  Electronically Signed   By: Macy Mis M.D.   On: 10/22/2020 12:03  Assessment  The primary encounter diagnosis was Post herpetic neuralgia. Diagnoses of Neuropathic pain, Spondylosis without myelopathy or radiculopathy, lumbar region, Lumbar facet arthropathy, Thoracic facet joint syndrome, and Chronic pain syndrome were also pertinent to this visit.  Plan of Care   Ms. EILA RUNYAN has a current medication list which includes the following long-term medication(s): amlodipine, atenolol, atorvastatin, fluticasone, ipratropium-albuterol, montelukast, and sertraline.  Pharmacotherapy (Medications Ordered): Meds ordered this encounter  Medications   HYDROcodone-acetaminophen (NORCO) 10-325 MG tablet    Sig: Take 1 tablet by mouth every 12 (twelve) hours as needed for severe pain. Must last 30 days.    Dispense:  60 tablet    Refill:  0    Chronic Pain. (STOP Act - Not applicable). Fill one day early if closed on scheduled refill date.   HYDROcodone-acetaminophen (NORCO) 10-325 MG  tablet    Sig: Take 1 tablet by mouth every 12 (twelve) hours as needed for severe pain. Must last 30 days.    Dispense:  60 tablet    Refill:  0    Chronic Pain. (STOP Act - Not applicable). Fill one day early if closed on scheduled refill date.   HYDROcodone-acetaminophen (NORCO) 10-325 MG tablet    Sig: Take 1 tablet by mouth every 12 (twelve) hours as needed for severe pain. Must last 30 days.    Dispense:  60 tablet    Refill:  0    Chronic Pain. (STOP Act - Not applicable). Fill one day early if closed on scheduled refill date.     Follow-up plan:   Return in about 14 weeks (around 05/19/2021) for Medication Management, in person.    Recent Visits No visits were found meeting these conditions. Showing recent visits within past 90 days and meeting  all other requirements Today's Visits Date Type Provider Dept  02/10/21 Office Visit Gillis Santa, MD [facility]  Showing today's visits and meeting all other requirements Future Appointments No visits were found meeting these conditions. Showing future appointments within next 90 days and meeting all other requirements I discussed the assessment and treatment plan with the patient. The patient was provided an opportunity to ask questions and all were answered. The patient agreed with the plan and demonstrated an understanding of the instructions.  Patient advised to call back or seek an in-person evaluation if the symptoms or condition worsens.  Duration of encounter: 41mnutes.  Note by: BGillis Santa MD Date: 02/10/2021; Time: 1:56 PM

## 2021-02-12 ENCOUNTER — Ambulatory Visit: Payer: Medicare PPO | Admitting: Podiatry

## 2021-03-25 ENCOUNTER — Other Ambulatory Visit: Payer: Self-pay

## 2021-03-25 ENCOUNTER — Ambulatory Visit
Payer: Medicare PPO | Attending: Student in an Organized Health Care Education/Training Program | Admitting: Student in an Organized Health Care Education/Training Program

## 2021-03-25 ENCOUNTER — Encounter: Payer: Self-pay | Admitting: Student in an Organized Health Care Education/Training Program

## 2021-03-25 ENCOUNTER — Telehealth: Payer: Self-pay

## 2021-03-25 DIAGNOSIS — M47816 Spondylosis without myelopathy or radiculopathy, lumbar region: Secondary | ICD-10-CM | POA: Diagnosis not present

## 2021-03-25 DIAGNOSIS — B0229 Other postherpetic nervous system involvement: Secondary | ICD-10-CM | POA: Diagnosis not present

## 2021-03-25 DIAGNOSIS — M792 Neuralgia and neuritis, unspecified: Secondary | ICD-10-CM | POA: Diagnosis not present

## 2021-03-25 MED ORDER — TIZANIDINE HCL 4 MG PO TABS: 2.0000 mg | ORAL_TABLET | Freq: Two times a day (BID) | ORAL | 1 refills | Status: DC | PRN

## 2021-03-25 NOTE — Progress Notes (Signed)
Patient: Autumn [name]  Service Category: E/M  Provider: Gillis Santa, MD  DOB: 1945-06-10  DOS: 03/25/2021  Location: Office  MRN: [id number]  Setting: Ambulatory outpatient  Referring Provider: Baxter Hire, MD  Type: Established Patient  Specialty: Interventional Pain Management  PCP: Baxter Hire, MD  Location: Home  Delivery: TeleHealth     Virtual Encounter - Pain Management PROVIDER NOTE: Information contained herein reflects review and annotations entered in association with encounter. Interpretation of such information and data should be left to medically-trained personnel. Information provided to patient can be located elsewhere in the medical record under "Patient Instructions". Document created using STT-dictation technology, any transcriptional errors that may result from process are unintentional.    Contact & Pharmacy Preferred: [phone] Home: [phone] (home) Mobile: [phone] (mobile) E-mail: [email or web address]  [id number] [place] [facility] [id number]: [phone]: [phone] [id number] [place]: [phone]: [phone] Pre-screening  Autumn [name] "in-person" vs "virtual" encounter. She indicated preferring virtual for this encounter.   Reason COVID-19*   Social distancing based on CDC and AMA recommendations.   I contacted Autumn Spitzn 03/25/2021 via telephone.      I clearly identified myself as BiGillis SantaMD. I verified that I was speaking with the correct person using two identifiers (Name: Autumn KOENENand date of birth: [birth year]  Consent I sought verbal advanced consent from Autumn Spitzor virtual visit interactions. I informed Autumn [name] possible security and privacy concerns, risks, and limitations associated with providing "not-in-person" medical evaluation and  management services. I also informed Autumn [name] the availability of "in-person" appointments. Finally, I informed her that there would be a charge for the virtual visit and that she could be  personally, fully or partially, financially responsible for it. Autumn [name] understanding and agreed to proceed.   Historic Elements   [name] a [age].o. year old, female patient evaluated today after our last contact on 02/10/2021. Autumn [name] a past medical history of Allergy, Anemia, Arthritis, COPD (chronic obstructive pulmonary disease) ([place] Glaucoma, H/O uvulectomy, Hypertension, Osteoporosis, Ovarian cancer (HC[place] Postherpetic neuralgia, Pulmonary sarcoidosis ([place] and Sarcoid. She also  has a past surgical history that includes Abdominal hysterectomy; Dilation and curettage of uterus; Vulvar lesion removal; Diagnostic laparoscopy; Eye surgery; Colonoscopy with propofol (N/A, 07/30/2019); and Breast excisional biopsy (Right). Ms. CaAllesas a current medication list which includes the following prescription(s): acetaminophen, albuterol, amlodipine, atenolol, atorvastatin, betamethasone dipropionate, denosumab, fluticasone, trelegy ellipta, hydrocodone-acetaminophen, [START ON 04/21/2021] hydrocodone-acetaminophen, ipratropium-albuterol, montelukast, polyethylene glycol powder, prednisone, propylene glycol, sertraline, and tizanidine. She  reports that she has never smoked. She has never used smokeless tobacco. She reports that she does not drink alcohol and does not use drugs. Ms. CaLyndss allergic to ivp dye [iodinated diagnostic agents].   HPI  Today, she is being contacted for worsening of previously known (established) problem  Patient is having increased lumbar spine pain related to lumbar disc disease and lumbar facet arthropathy.  She also has a history of postherpetic neuralgia.  She is having constipation.  I encouraged her to utilize Colace if she goes 1 to 2 days without a  bowel movement to supplement with MiraLAX.  She states that she did utilize MiraLAX today.  I will  add on a low-dose muscle relaxer and hopefully she can avoid her hydrocodone.  I also offered her trigger point injections.  Lumbar facet medial branch nerve blocks for her low back pain.  She states that she is going to think about this further and will let us know if she would like to move forward with this if the tizanidine is not helpful.  Monitoring:  PMP: PDMP reviewed during this encounter.       Pharmacotherapy: No side-effects or adverse reactions reported. Compliance: No problems identified. Effectiveness: Clinically acceptable. Plan: Refer to "POC". UDS:  Summary  Date Value Ref Range Status  04/01/2020 Note  Final    Comment:    ==================================================================== ToxASSURE Select 13 (MW) ==================================================================== Test                             Result       Flag       Units  Drug Present and Declared for Prescription Verification   Hydrocodone                    658          EXPECTED   ng/mg creat   Hydromorphone                  39           EXPECTED   ng/mg creat   Dihydrocodeine                 101          EXPECTED   ng/mg creat   Norhydrocodone                 1868         EXPECTED   ng/mg creat    Sources of hydrocodone include scheduled prescription medications.    Hydromorphone, dihydrocodeine and norhydrocodone are expected    metabolites of hydrocodone. Hydromorphone and dihydrocodeine are    also available as scheduled prescription medications.  ==================================================================== Test                      Result    Flag   Units      Ref Range   Creatinine              154              mg/dL      >=20 ==================================================================== Declared Medications:  The flagging and interpretation on this report are based on  the  following declared medications.  Unexpected results may arise from  inaccuracies in the declared medications.   **Note: The testing scope of this panel includes these medications:   Hydrocodone   **Note: The testing scope of this panel does not include the  following reported medications:   Acetaminophen  Albuterol  Albuterol (Duoneb)  Atenolol  Atorvastatin  Calcium  Cyanocobalamin  Denosumab  Fish Oil  Fluticasone  Fluticasone (Trelegy)  Ipratropium (Duoneb)  Meloxicam (Mobic)  Montelukast (Singulair)  Polyethylene Glycol  Prednisone  Pregabalin  Psyllium  Sertraline  Umeclidinium (Trelegy)  Vilanterol (Trelegy)  Vitamin D3 ==================================================================== For clinical consultation, please call [phone]. ====================================================================      Laboratory Chemistry Profile   Renal Lab Results  Component Value Date   BUN 29 (H) 10/07/2020   CREATININE 0.72 10/07/2020   GFRAA >60 02/13/2018   GFRNONAA >60 10/07/2020  Hepatic Lab Results  Component Value Date   AST 21 10/07/2020   ALT 17 10/07/2020   ALBUMIN 4.4 10/07/2020   ALKPHOS 33 (L) 10/07/2020   LIPASE 32 10/07/2020    Electrolytes Lab Results  Component Value Date   NA 140 10/07/2020   K 4.0 10/07/2020   CL 98 10/07/2020   CALCIUM 10.0 10/07/2020   MG 2.7 (H) 02/07/2018   PHOS 3.2 02/07/2018    Bone No results found for: VD25OH, [id number], EH6314HF0, YO3785YI5, 25OHVITD1, 25OHVITD2, 25OHVITD3, TESTOFREE, TESTOSTERONE  Inflammation (CRP: Acute Phase) (ESR: Chronic Phase) Lab Results  Component Value Date   ESRSEDRATE 48 (H) 05/25/2011         Note: Above Lab results reviewed.  Imaging  DG UGI W SMALL BOWEL CLINICAL DATA:  Chronic nausea, epigastric pain, lower abdominal pain  EXAM: UPPER GI SERIES WITH SMALL BOWEL FOLLOW-THROUGH  FLUOROSCOPY TIME:  Fluoroscopy Time:  1 minutes 12  seconds  Radiation Exposure Index (if provided by the fluoroscopic device): 26.5 mGy  TECHNIQUE: Combined double contrast and single contrast upper GI series using effervescent crystals, thick barium, and thin barium. Subsequently, serial images of the small bowel were obtained including spot views of the terminal ileum.  COMPARISON:  None.  FINDINGS: Scout radiograph of the abdomen demonstrates normal bowel gas pattern. Levocurvature of the lumbar spine with degenerative changes.  Patient swallowed barium without difficulty. Normal hypopharyngeal contours. No aspiration. Esophageal motility is unremarkable for age. There is no esophageal mass or stricture. Stomach is unremarkable. No hiatal hernia. No spontaneous gastroesophageal reflux.  There is no delay in gastric emptying. Contrast enters the cecum by 25 minutes. Majority of small bowel loops are within the pelvis and difficult to separate, which may reflect adhesions from prior pelvic surgery. Terminal ileum is suboptimally evaluated due to underdistention.  IMPRESSION: No mass or stricture.  Question adhesions due to prior pelvic surgery. However, no delay in contrast passage. Contrast reaches the cecum by 25 minutes.  Electronically Signed   By: Macy Mis M.D.   On: 10/22/2020 12:03  Assessment  The primary encounter diagnosis was Post herpetic neuralgia. Diagnoses of Neuropathic pain, Spondylosis without myelopathy or radiculopathy, lumbar region, and Lumbar facet arthropathy were also pertinent to this visit.  Plan of Care    Ms. JENNAFER GLADUE has a current medication list which includes the following long-term medication(s): amlodipine, atenolol, atorvastatin, fluticasone, ipratropium-albuterol, montelukast, and sertraline.  Pharmacotherapy (Medications Ordered): Meds ordered this encounter  Medications   tiZANidine (ZANAFLEX) 4 MG tablet    Sig: Take 0.5-1 tablets (2-4 mg total) by mouth 2 (two)  times daily as needed for muscle spasms.    Dispense:  60 tablet    Refill:  1    Do not place this medication, or any other prescription from our practice, on "Automatic Refill". Patient may have prescription filled one day early if pharmacy is closed on scheduled refill date.    Follow-up plan:   Return for Keep sch. appt.    Recent Visits Date Type Provider Dept  02/10/21 Office Visit Gillis Santa, MD [facility]  Showing recent visits within past 90 days and meeting all other requirements Today's Visits Date Type Provider Dept  03/25/21 Office Visit Gillis Santa, MD [facility]  Showing today's visits and meeting all other requirements Future Appointments Date Type Provider Dept  05/14/21 Appointment Gillis Santa, MD [facility]  Showing future appointments within next 90 days and meeting all other requirements I  discussed the assessment and treatment plan with the patient. The patient was provided an opportunity to ask questions and all were answered. The patient agreed with the plan and demonstrated an understanding of the instructions.  Patient advised to call back or seek an in-person evaluation if the symptoms or condition worsens.  Duration of encounter: 20 minutes.  Note by: Gillis Santa, MD Date: 03/25/2021; Time: 2:25 PM

## 2021-03-25 NOTE — Telephone Encounter (Signed)
LM for patient to call office for pre virtual appointment questions.  

## 2021-04-17 ENCOUNTER — Other Ambulatory Visit: Payer: Self-pay | Admitting: Student in an Organized Health Care Education/Training Program

## 2021-04-17 DIAGNOSIS — M792 Neuralgia and neuritis, unspecified: Secondary | ICD-10-CM

## 2021-04-17 DIAGNOSIS — M47816 Spondylosis without myelopathy or radiculopathy, lumbar region: Secondary | ICD-10-CM

## 2021-04-27 ENCOUNTER — Encounter: Payer: Self-pay | Admitting: Student in an Organized Health Care Education/Training Program

## 2021-04-28 ENCOUNTER — Ambulatory Visit
Payer: Medicare PPO | Attending: Student in an Organized Health Care Education/Training Program | Admitting: Student in an Organized Health Care Education/Training Program

## 2021-04-28 ENCOUNTER — Other Ambulatory Visit: Payer: Self-pay

## 2021-04-28 ENCOUNTER — Encounter: Payer: Self-pay | Admitting: Student in an Organized Health Care Education/Training Program

## 2021-04-28 DIAGNOSIS — M47816 Spondylosis without myelopathy or radiculopathy, lumbar region: Secondary | ICD-10-CM

## 2021-04-28 DIAGNOSIS — G894 Chronic pain syndrome: Secondary | ICD-10-CM | POA: Diagnosis not present

## 2021-04-28 DIAGNOSIS — M792 Neuralgia and neuritis, unspecified: Secondary | ICD-10-CM | POA: Diagnosis not present

## 2021-04-28 DIAGNOSIS — B0229 Other postherpetic nervous system involvement: Secondary | ICD-10-CM

## 2021-04-28 NOTE — Progress Notes (Signed)
Patient: Autumn [name]  Service Category: E/M  Provider: Gillis Santa, MD  DOB: 05/20/1945  DOS: 04/28/2021  Location: Office  MRN: [id number]  Setting: Ambulatory outpatient  Referring Provider: Baxter Hire, MD  Type: Established Patient  Specialty: Interventional Pain Management  PCP: Baxter Hire, MD  Location: Home  Delivery: TeleHealth     Virtual Encounter - Pain Management PROVIDER NOTE: Information contained herein reflects review and annotations entered in association with encounter. Interpretation of such information and data should be left to medically-trained personnel. Information provided to patient can be located elsewhere in the medical record under "Patient Instructions". Document created using STT-dictation technology, any transcriptional errors that may result from process are unintentional.    Contact & Pharmacy Preferred: [phone] Home: [phone] (home) Mobile: [phone] (mobile) E-mail: [email or web address]  [id number] [place] [id number]: [phone]: [phone] [id number] [place] [id number]: [phone]: [phone] Pre-screening  Autumn [name] "in-person" vs "virtual" encounter. She indicated preferring virtual for this encounter.   Reason COVID-19*   Social distancing based on CDC and AMA recommendations.   I contacted Autumn Spitzn 04/28/2021 via telephone.      I clearly identified myself as BiGillis SantaMD. I verified that I was speaking with the correct person using two identifiers (Name: Autumn KNICKERBOCKERand date of birth: [birth year]  Consent I sought verbal advanced consent from Autumn [name] virtual visit interactions. I informed Autumn [name] possible security and privacy concerns, risks, and limitations associated with providing "not-in-person" medical evaluation and  management services. I also informed Autumn [name] the availability of "in-person" appointments. Finally, I informed her that there would be a charge for the virtual visit and that she could be  personally, fully or partially, financially responsible for it. Autumn [name] understanding and agreed to proceed.   Historic Elements   Autumn [name] a [age].o. year old, female patient evaluated today after our last contact on 04/17/2021. [name] a past medical history of Allergy, Anemia, Arthritis, COPD (chronic obstructive pulmonary disease) ([facility] Glaucoma, H/O uvulectomy, Hypertension, Osteoporosis, Ovarian cancer ([facility] Postherpetic neuralgia, Pulmonary sarcoidosis ([facility] and Sarcoid. She also  has a past surgical history that includes Abdominal hysterectomy; Dilation and curettage of uterus; Vulvar lesion removal; Diagnostic laparoscopy; Eye surgery; Colonoscopy with propofol (N/A, 07/30/2019); and Breast excisional biopsy (Right). Autumn [name] a current medication list which includes the following prescription(s): acetaminophen, albuterol, amlodipine, atenolol, atorvastatin, betamethasone dipropionate, denosumab, fluticasone, trelegy ellipta, hydrocodone-acetaminophen, ipratropium-albuterol, losartan, omeprazole, polyethylene glycol powder, prednisone, propylene glycol, sertraline, tizanidine, and montelukast. She  reports that she has never smoked. She has never used smokeless tobacco. She reports that she does not drink alcohol and does not use drugs. Autumn [name] allergic to ivp dye [iodinated contrast media].   HPI  Today, she is being contacted for worsening of previously known (established) problem  Complaining of increased lower lumbar spine pain secondary to lumbar degenerative disc disease, lumbar facet arthropathy and lumbar spondylosis.  At past appointments we have discussed diagnostic lumbar facet medial branch nerve blocks for her low back pain related to lumbar facet  arthropathy.  She would now like to move forward with this as she is having more difficulty.  I have also encouraged her to  increase her tizanidine to a full tablet, 4 mg to help manage her lower lumbar paraspinal spasms better.  Laboratory Chemistry Profile   Renal Lab Results  Component Value Date   BUN 29 (H) 10/07/2020   CREATININE 0.72 10/07/2020   GFRAA >60 02/13/2018   GFRNONAA >60 10/07/2020    Hepatic Lab Results  Component Value Date   AST 21 10/07/2020   ALT 17 10/07/2020   ALBUMIN 4.4 10/07/2020   ALKPHOS 33 (L) 10/07/2020   LIPASE 32 10/07/2020    Electrolytes Lab Results  Component Value Date   NA 140 10/07/2020   K 4.0 10/07/2020   CL 98 10/07/2020   CALCIUM 10.0 10/07/2020   MG 2.7 (H) 02/07/2018   PHOS 3.2 02/07/2018    Bone No results found for: VD25OH, [id number], SF6812XN1, ZG0174BS4, 25OHVITD1, 25OHVITD2, 25OHVITD3, TESTOFREE, TESTOSTERONE  Inflammation (CRP: Acute Phase) (ESR: Chronic Phase) Lab Results  Component Value Date   ESRSEDRATE 48 (H) 05/25/2011         Note: Above Lab results reviewed.  Imaging  DG UGI W SMALL BOWEL CLINICAL DATA:  Chronic nausea, epigastric pain, lower abdominal pain  EXAM: UPPER GI SERIES WITH SMALL BOWEL FOLLOW-THROUGH  FLUOROSCOPY TIME:  Fluoroscopy Time:  1 minutes 12 seconds  Radiation Exposure Index (if provided by the fluoroscopic device): 26.5 mGy  TECHNIQUE: Combined double contrast and single contrast upper GI series using effervescent crystals, thick barium, and thin barium. Subsequently, serial images of the small bowel were obtained including spot views of the terminal ileum.  COMPARISON:  None.  FINDINGS: Scout radiograph of the abdomen demonstrates normal bowel gas pattern. Levocurvature of the lumbar spine with degenerative changes.  Patient swallowed barium without difficulty. Normal hypopharyngeal contours. No aspiration. Esophageal motility is unremarkable for age. There is no  esophageal mass or stricture. Stomach is unremarkable. No hiatal hernia. No spontaneous gastroesophageal reflux.  There is no delay in gastric emptying. Contrast enters the cecum by 25 minutes. Majority of small bowel loops are within the pelvis and difficult to separate, which may reflect adhesions from prior pelvic surgery. Terminal ileum is suboptimally evaluated due to underdistention.  IMPRESSION: No mass or stricture.  Question adhesions due to prior pelvic surgery. However, no delay in contrast passage. Contrast reaches the cecum by 25 minutes.  Electronically Signed   By: Macy Mis M.D.   On: 10/22/2020 12:03  CLINICAL DATA:  Low back pain radiating to the hips. History of ovarian carcinoma.   EXAM: MRI LUMBAR SPINE WITHOUT CONTRAST   TECHNIQUE: Multiplanar, multisequence MR imaging of the lumbar spine was performed. No intravenous contrast was administered.   COMPARISON:  None.   FINDINGS: Segmentation:  Standard   Alignment: Grade 1 anterolisthesis at L3-4 and levoscoliosis with apex at L3.   Vertebrae:  No fracture, evidence of discitis, or bone lesion.   Conus medullaris and cauda equina: Conus extends to the L2 level. Conus and cauda equina appear normal.   Paraspinal and other soft tissues: Negative.   Disc levels:   L1-L2: Small disc bulge. No spinal canal stenosis. No neural foraminal stenosis.   L2-L3: Normal disc space and facet joints. No spinal canal stenosis. No neural foraminal stenosis.   L3-L4: Right asymmetric disc bulge. No spinal canal stenosis. No neural foraminal stenosis.   L4-L5: Intermediate sized left foraminal disc extrusion with superior migration. Left lateral recess narrowing without central spinal canal stenosis. No neural foraminal stenosis.   L5-S1: Normal disc space and facet joints. No spinal  canal stenosis. No neural foraminal stenosis.   Visualized sacrum: Normal.   IMPRESSION: 1. Intermediate sized  left foraminal disc extrusion with superior migration at L4-L5 resulting in left lateral recess stenosis. Correlate for left L5 radiculopathy. 2. Grade 1 anterolisthesis at L3-4 and levoscoliosis with apex at L3. 3. No other spinal canal or neural foraminal stenosis.  Assessment  The primary encounter diagnosis was Lumbar facet arthropathy. Diagnoses of Spondylosis without myelopathy or radiculopathy, lumbar region, Chronic pain syndrome, Post herpetic neuralgia, and Postinfective intercostal neuralgia were also pertinent to this visit.  [place] has a history of greater than 3 months of moderate to severe pain which is resulted in functional impairment.  The patient has tried various conservative therapeutic options such as NSAIDs, Tylenol, muscle relaxants, physical therapy which was inadequately effective.  Patient's pain is predominantly axial with physical exam and MRI findings suggestive of facet arthropathy. Lumbar facet medial branch nerve blocks were discussed with the patient.  Risks and benefits were reviewed.  Patient would like to proceed with bilateral L3, L4, L5 medial branch nerve block.  Continue tizanidine as prescribed.  Previous trial of gabapentin and Lyrica resulted in sedation and balance issues.  Orders:  Orders Placed This Encounter  Procedures   LUMBAR FACET(MEDIAL BRANCH NERVE BLOCK) MBNB    Standing Status:   Future    Standing Expiration Date:   05/29/2021    Scheduling Instructions:     Procedure: Lumbar facet block (AKA.: Lumbosacral medial branch nerve block)     Side: Bilateral     Level: L3-4, L4-5,  Facets ( L3, L4, L5, Medial Branch Nerves)     Sedation: local     Timeframe: ASAA    Order Specific Question:   Where will this procedure be performed?    Answer:   [facility]   Follow-up plan:   Return in about 2 weeks (around 05/12/2021) for B/L L3, 4, 5 MBNB #1, without sedation.    Recent Visits Date Type Provider Dept   03/25/21 Office Visit Gillis Santa, MD [facility]  02/10/21 Office Visit Gillis Santa, MD [facility]  Showing recent visits within past 90 days and meeting all other requirements Today's Visits Date Type Provider Dept  04/28/21 Office Visit Gillis Santa, MD [facility]  Showing today's visits and meeting all other requirements Future Appointments Date Type Provider Dept  05/14/21 Appointment Gillis Santa, MD [facility]  Showing future appointments within next 90 days and meeting all other requirements  I discussed the assessment and treatment plan with the patient. The patient was provided an opportunity to ask questions and all were answered. The patient agreed with the plan and demonstrated an understanding of the instructions.  Patient advised to call back or seek an in-person evaluation if the symptoms or condition worsens.  Duration of encounter: 46mnutes.  Note by: BGillis Santa MD Date: 04/28/2021; Time: 3:05 PM

## 2021-04-29 NOTE — Patient Instructions (Signed)

## 2021-05-11 ENCOUNTER — Other Ambulatory Visit: Payer: Self-pay

## 2021-05-11 ENCOUNTER — Ambulatory Visit (HOSPITAL_BASED_OUTPATIENT_CLINIC_OR_DEPARTMENT_OTHER): Payer: Medicare PPO | Admitting: Student in an Organized Health Care Education/Training Program

## 2021-05-11 ENCOUNTER — Encounter: Payer: Self-pay | Admitting: Student in an Organized Health Care Education/Training Program

## 2021-05-11 ENCOUNTER — Ambulatory Visit
Admission: RE | Admit: 2021-05-11 | Discharge: 2021-05-11 | Disposition: A | Discharge status: Home / Self Care | Payer: Medicare PPO | Source: Ambulatory Visit | Attending: Student in an Organized Health Care Education/Training Program | Admitting: Student in an Organized Health Care Education/Training Program

## 2021-05-11 DIAGNOSIS — M47816 Spondylosis without myelopathy or radiculopathy, lumbar region: Secondary | ICD-10-CM | POA: Insufficient documentation

## 2021-05-11 DIAGNOSIS — M792 Neuralgia and neuritis, unspecified: Secondary | ICD-10-CM

## 2021-05-11 DIAGNOSIS — G894 Chronic pain syndrome: Secondary | ICD-10-CM | POA: Insufficient documentation

## 2021-05-11 MED ORDER — DEXAMETHASONE SODIUM PHOSPHATE 10 MG/ML IJ SOLN
INTRAMUSCULAR | Status: AC
  Filled 2021-05-11: qty 2

## 2021-05-11 MED ORDER — LIDOCAINE HCL 2 % IJ SOLN
INTRAMUSCULAR | Status: AC
  Filled 2021-05-11: qty 20

## 2021-05-11 MED ORDER — ROPIVACAINE HCL 2 MG/ML IJ SOLN
INTRAMUSCULAR | Status: AC
  Filled 2021-05-11: qty 20

## 2021-05-11 MED ORDER — ROPIVACAINE HCL 2 MG/ML IJ SOLN
9.0000 mL | Freq: Once | INTRAMUSCULAR | Status: AC
  Administered 2021-05-11: 9 mL via PERINEURAL

## 2021-05-11 MED ORDER — DEXAMETHASONE SODIUM PHOSPHATE 10 MG/ML IJ SOLN
10.0000 mg | Freq: Once | INTRAMUSCULAR | Status: AC
  Administered 2021-05-11: 10 mg

## 2021-05-11 MED ORDER — LIDOCAINE HCL 2 % IJ SOLN
20.0000 mL | Freq: Once | INTRAMUSCULAR | Status: AC
  Administered 2021-05-11: 400 mg

## 2021-05-11 NOTE — Progress Notes (Signed)
PROVIDER NOTE: Interpretation of information contained herein should be left to medically-trained personnel. Specific patient instructions are provided elsewhere under "Patient Instructions" section of medical record. This document was created in part using STT-dictation technology, any transcriptional errors that may result from this process are unintentional.  Patient: Autumn [name] Type: Established DOB: December 29, 1945 MRN: [id number] PCP: Autumn Hire, MD  Service: Procedure DOS: 05/11/2021 Setting: Ambulatory Location: Ambulatory outpatient facility Delivery: Face-to-face Provider: Gillis Santa, MD Specialty: Interventional Pain Management Specialty designation: 09 Location: Outpatient facility Ref. Prov.: Autumn Santa, MD    Primary Reason for Visit: Interventional Pain Management Treatment. CC: Back Pain (lower)   Procedure:           Type: Lumbar Facet, Medial Branch Block(s) #1  Laterality: Bilateral  Level: L3, L4, L5, Medial Branch Level(s). Injecting these levels blocks the L3-4 and L5-S1 lumbar facet joints. Imaging: Fluoroscopic guidance Anesthesia: Local anesthesia (1-2% Lidocaine) Anxiolysis: None                 Sedation: None. DOS: 05/11/2021 Performed by: Autumn Santa, MD  Primary Purpose: Diagnostic/Therapeutic Indications: Low back pain severe enough to impact quality of life or function. 1. Lumbar facet arthropathy   2. Spondylosis without myelopathy or radiculopathy, lumbar region   3. Chronic pain syndrome    NAS-11 Pain score:   Pre-procedure: 8 /10   Post-procedure: 7  (standing and moving)/10     Position / Prep / Materials:  Position: Prone  Prep solution: DuraPrep (Iodine Povacrylex [0.7% available iodine] and Isopropyl Alcohol, 74% w/w) Area Prepped: Posterolateral Lumbosacral Spine (Wide prep: From the lower border of the scapula down to the end of the tailbone and from flank to flank.) Pre-op H&P Assessment:  [name] is a 76 y.o. (year  old), female patient, seen today for interventional treatment. She  has a past surgical history that includes Abdominal hysterectomy; Dilation and curettage of uterus; Vulvar lesion removal; Diagnostic laparoscopy; Eye surgery; Colonoscopy with propofol (N/A, 07/30/2019); and Breast excisional biopsy (Right). [name] has a current medication list which includes the following prescription(s): acetaminophen, albuterol, amlodipine, atenolol, atorvastatin, denosumab, fluticasone, trelegy ellipta, hydrocodone-acetaminophen, ipratropium-albuterol, losartan, omeprazole, polyethylene glycol powder, prednisone, propylene glycol, sertraline, tizanidine, and montelukast. Her primarily concern today is the Back Pain (lower)  Initial Vital Signs:  Pulse/HCG Rate: 71  Temp:  (!) 97.4 F (36.3 C) Resp: 18 BP:  (!) 162/69 SpO2: 100 % (on 3L O2)  BMI: Estimated body mass index is 18.95 kg/m as calculated from the following:   Height as of this encounter: 5\' 7"  (1.702 m).   Weight as of 11/11/20: 121 lb (54.9 kg).  Risk Assessment: Allergies: Reviewed. She is allergic to ivp dye [iodinated contrast media].  Allergy Precautions: None required Coagulopathies: Reviewed. None identified.  Blood-thinner therapy: None at this time Active Infection(s): Reviewed. None identified. [name] is afebrile  Site Confirmation: [name] was asked to confirm the procedure and laterality before marking the site Procedure checklist: Completed Consent: Before the procedure and under the influence of no sedative(s), amnesic(s), or anxiolytics, the patient was informed of the treatment options, risks and possible complications. To fulfill our ethical and legal obligations, as recommended by the American Medical Association's Code of Ethics, I have informed the patient of my clinical impression; the nature and purpose of the treatment or procedure; the risks, benefits, and possible complications of the intervention; the  alternatives, including doing nothing; the risk(s) and benefit(s) of the alternative treatment(s) or procedure(s); and  the risk(s) and benefit(s) of doing nothing. The patient was provided information about the general risks and possible complications associated with the procedure. These may include, but are not limited to: failure to achieve desired goals, infection, bleeding, organ or nerve damage, allergic reactions, paralysis, and death. In addition, the patient was informed of those risks and complications associated to Spine-related procedures, such as failure to decrease pain; infection (i.e.: Meningitis, epidural or intraspinal abscess); bleeding (i.e.: epidural hematoma, subarachnoid hemorrhage, or any other type of intraspinal or peri-dural bleeding); organ or nerve damage (i.e.: Any type of peripheral nerve, nerve root, or spinal cord injury) with subsequent damage to sensory, motor, and/or autonomic systems, resulting in permanent pain, numbness, and/or weakness of one or several areas of the body; allergic reactions; (i.e.: anaphylactic reaction); and/or death. Furthermore, the patient was informed of those risks and complications associated with the medications. These include, but are not limited to: allergic reactions (i.e.: anaphylactic or anaphylactoid reaction(s)); adrenal axis suppression; blood sugar elevation that in diabetics may result in ketoacidosis or comma; water retention that in patients with history of congestive heart failure may result in shortness of breath, pulmonary edema, and decompensation with resultant heart failure; weight gain; swelling or edema; medication-induced neural toxicity; particulate matter embolism and blood vessel occlusion with resultant organ, and/or nervous system infarction; and/or aseptic necrosis of one or more joints. Finally, the patient was informed that Medicine is not an exact science; therefore, there is also the possibility of unforeseen or  unpredictable risks and/or possible complications that may result in a catastrophic outcome. The patient indicated having understood very clearly. We have given the patient no guarantees and we have made no promises. Enough time was given to the patient to ask questions, all of which were answered to the patient's satisfaction. Ms. Beltran has indicated that she wanted to continue with the procedure. Attestation: I, the ordering provider, attest that I have discussed with the patient the benefits, risks, side-effects, alternatives, likelihood of achieving goals, and potential problems during recovery for the procedure that I have provided informed consent. Date   Time: 05/11/2021 10:46 AM  Pre-Procedure Preparation:  Monitoring: As per clinic protocol. Respiration, ETCO2, SpO2, BP, heart rate and rhythm monitor placed and checked for adequate function Safety Precautions: Patient was assessed for positional comfort and pressure points before starting the procedure. Time-out: I initiated and conducted the "Time-out" before starting the procedure, as per protocol. The patient was asked to participate by confirming the accuracy of the "Time Out" information. Verification of the correct person, site, and procedure were performed and confirmed by me, the nursing staff, and the patient. "Time-out" conducted as per Joint Commission's Universal Protocol (UP.01.01.01). Time: 1124  Description of Procedure:          Laterality: Bilateral. The procedure was performed in identical fashion on both sides. Levels: L3, L4, L5, Medial Branch Level(s)  Safety Precautions: Aspiration looking for blood return was conducted prior to all injections. At no point did we inject any substances, as a needle was being advanced. Before injecting, the patient was told to immediately notify me if she was experiencing any new onset of "ringing in the ears, or metallic taste in the mouth". No attempts were made at seeking any  paresthesias. Safe injection practices and needle disposal techniques used. Medications properly checked for expiration dates. SDV (single dose vial) medications used. After the completion of the procedure, all disposable equipment used was discarded in the proper designated medical waste containers. Local Anesthesia:  Protocol guidelines were followed. The patient was positioned over the fluoroscopy table. The area was prepped in the usual manner. The time-out was completed. The target area was identified using fluoroscopy. A 12-in long, straight, sterile hemostat was used with fluoroscopic guidance to locate the targets for each level blocked. Once located, the skin was marked with an approved surgical skin marker. Once all sites were marked, the skin (epidermis, dermis, and hypodermis), as well as deeper tissues (fat, connective tissue and muscle) were infiltrated with a small amount of a short-acting local anesthetic, loaded on a 10cc syringe with a 25G, 1.5-in  Needle. An appropriate amount of time was allowed for local anesthetics to take effect before proceeding to the next step. Local Anesthetic: Lidocaine 2.0% The unused portion of the local anesthetic was discarded in the proper designated containers. Technical explanation of process:  L3 Medial Branch Nerve Block (MBB): The target area for the L3 medial branch is at the junction of the postero-lateral aspect of the superior articular process and the superior, posterior, and medial edge of the transverse process of L4. Under fluoroscopic guidance, a Quincke needle was inserted until contact was made with os over the superior postero-lateral aspect of the pedicular shadow (target area). After negative aspiration for blood, 46mL of the nerve block solution was injected without difficulty or complication. The needle was removed intact. L4 Medial Branch Nerve Block (MBB): The target area for the L4 medial branch is at the junction of the postero-lateral  aspect of the superior articular process and the superior, posterior, and medial edge of the transverse process of L5. Under fluoroscopic guidance, a Quincke needle was inserted until contact was made with os over the superior postero-lateral aspect of the pedicular shadow (target area). After negative aspiration for blood, 67mL of the nerve block solution was injected without difficulty or complication. The needle was removed intact. L5 Medial Branch Nerve Block (MBB): The target area for the L5 medial branch is at the junction of the postero-lateral aspect of the superior articular process and the superior, posterior, and medial edge of the sacral ala. Under fluoroscopic guidance, a Quincke needle was inserted until contact was made with os over the superior postero-lateral aspect of the pedicular shadow (target area). After negative aspiration for blood, 48mL of the nerve block solution was injected without difficulty or complication. The needle was removed intact.  6 cc solution made of 4 cc of 0.2% ropivacaine, 2 cc of Decadron 10 mg/cc. 1 cc injected at each level above bilaterally.   Once the entire procedure was completed, the treated area was cleaned, making sure to leave some of the prepping solution back to take advantage of its long term bactericidal properties.      Illustration of the posterior view of the lumbar spine and the posterior neural structures. Laminae of L2 through S1 are labeled. DPRL5, dorsal primary ramus of L5; DPRS1, dorsal primary ramus of S1; DPR3, dorsal primary ramus of L3; FJ, facet (zygapophyseal) joint L3-L4; I, inferior articular process of L4; LB1, lateral branch of dorsal primary ramus of L1; IAB, inferior articular branches from L3 medial branch (supplies L4-L5 facet joint); IBP, intermediate branch plexus; MB3, medial branch of dorsal primary ramus of L3; NR3, third lumbar nerve root; S, superior articular process of L5; SAB, superior articular branches from L4  (supplies L4-5 facet joint also); TP3, transverse process of L3.  Vitals:   05/11/21 1058 05/11/21 1120 05/11/21 1130 05/11/21 1135  BP: (!) 162/69 (!) 197/79 [name])  177/84 (!) 174/82  Pulse: 71 74 73 74  Resp: 18 (!) 22 19 18   Temp: (!) 97.4 F (36.3 C)     TempSrc: Temporal     SpO2: 100% 100% 100% 100%  Height: 5\' 7"  (1.702 m)        Start Time: 1124 hrs. End Time: 1131 hrs.  Imaging Guidance (Spinal):          Type of Imaging Technique: Fluoroscopy Guidance (Spinal) Indication(s): Assistance in needle guidance and placement for procedures requiring needle placement in or near specific anatomical locations not easily accessible without such assistance. Exposure Time: Please see nurses notes. Contrast: None used. Fluoroscopic Guidance: I was personally present during the use of fluoroscopy. "Tunnel Vision Technique" used to obtain the best possible view of the target area. Parallax error corrected before commencing the procedure. "Direction-depth-direction" technique used to introduce the needle under continuous pulsed fluoroscopy. Once target was reached, antero-posterior, oblique, and lateral fluoroscopic projection used confirm needle placement in all planes. Images permanently stored in EMR. Interpretation: No contrast injected. I personally interpreted the imaging intraoperatively. Adequate needle placement confirmed in multiple planes. Permanent images saved into the patient's record.   Post-operative Assessment:  Post-procedure Vital Signs:  Pulse/HCG Rate: 74 (NSR)  Temp:  (!) 97.4 F (36.3 C) Resp: 18 BP:  (!) 174/82 SpO2: 100 %  EBL: None  Complications: No immediate post-treatment complications observed by team, or reported by patient.  Note: The patient tolerated the entire procedure well. A repeat set of vitals were taken after the procedure and the patient was kept under observation following institutional policy, for this type of procedure. Post-procedural  neurological assessment was performed, showing return to baseline, prior to discharge. The patient was provided with post-procedure discharge instructions, including a section on how to identify potential problems. Should any problems arise concerning this procedure, the patient was given instructions to immediately contact us, at any time, without hesitation. In any case, we plan to contact the patient by telephone for a follow-up status report regarding this interventional procedure.  Comments:  No additional relevant information.  Plan of Care  Orders:  Orders Placed This Encounter  Procedures   DG [facility] C-ARM 1-60 MIN NO REPORT    Intraoperative interpretation by procedural physician at [facility].    Standing Status:   Standing    Number of Occurrences:   1    Order Specific Question:   Reason for exam:    Answer:   Assistance in needle guidance and placement for procedures requiring needle placement in or near specific anatomical locations not easily accessible without such assistance.   Medications ordered for procedure: Meds ordered this encounter  Medications   lidocaine (XYLOCAINE) 2 % (with pres) injection 400 mg   dexamethasone (DECADRON) injection 10 mg   dexamethasone (DECADRON) injection 10 mg   ropivacaine (PF) 2 mg/mL (0.2%) (NAROPIN) injection 9 mL   Medications administered: We administered lidocaine, dexamethasone, dexamethasone, and ropivacaine (PF) 2 mg/mL (0.2%).  See the medical record for exact dosing, route, and time of administration.  Follow-up plan:   Return in about 4 weeks (around 06/08/2021) for Post Procedure Evaluation, virtual.      Recent Visits Date Type Provider Dept  04/28/21 Office Visit Autumn Santa, MD [facility]  03/25/21 Office Visit Autumn Santa, MD [facility]  02/10/21 Office Visit Autumn Santa, MD [facility]  Showing recent visits within past 90 days and meeting all other  requirements Today's  Visits Date Type Provider Dept  05/11/21 Procedure visit Autumn Santa, MD [facility]  Showing today's visits and meeting all other requirements Future Appointments Date Type Provider Dept  06/08/21 Appointment Autumn Santa, MD [facility]  Showing future appointments within next 90 days and meeting all other requirements  Disposition: Discharge home  Discharge (Date   Time): 05/11/2021; 1143 hrs.   Primary Care Physician: Autumn Hire, MD Location: [facility] Note by: Autumn Santa, MD Date: 05/11/2021; Time: 12:49 PM  Disclaimer:  Medicine is not an exact science. The only guarantee in medicine is that nothing is guaranteed. It is important to note that the decision to proceed with this intervention was based on the information collected from the patient. The Data and conclusions were drawn from the patient's questionnaire, the interview, and the physical examination. Because the information was provided in large part by the patient, it cannot be guaranteed that it has not been purposely or unconsciously manipulated. Every effort has been made to obtain as much relevant data as possible for this evaluation. It is important to note that the conclusions that lead to this procedure are derived in large part from the available data. Always take into account that the treatment will also be dependent on availability of resources and existing treatment guidelines, considered by other Pain Management Practitioners as being common knowledge and practice, at the time of the intervention. For Medico-Legal purposes, it is also important to point out that variation in procedural techniques and pharmacological choices are the acceptable norm. The indications, contraindications, technique, and results of the above procedure should only be interpreted and judged by a Board-Certified Interventional Pain Specialist with extensive  familiarity and expertise in the same exact procedure and technique.

## 2021-05-11 NOTE — Progress Notes (Signed)
Safety precautions to be maintained throughout the outpatient stay will include: orient to surroundings, keep bed in low position, maintain call bell within reach at all times, provide assistance with transfer out of bed and ambulation.  

## 2021-05-11 NOTE — Patient Instructions (Signed)

## 2021-05-12 ENCOUNTER — Telehealth: Payer: Self-pay

## 2021-05-12 ENCOUNTER — Telehealth: Payer: Self-pay | Admitting: *Deleted

## 2021-05-12 MED ORDER — TIZANIDINE HCL 4 MG PO TABS: 2.0000 mg | ORAL_TABLET | Freq: Two times a day (BID) | ORAL | 5 refills | Status: DC | PRN

## 2021-05-12 NOTE — Telephone Encounter (Signed)
Called patient to let her know that she will need to come in for MM on Feb 9 at 920 for her hydrocodone and that he would send in her muscles to the pharm. Patient had called earlier and only ha 8 pills left of muscle relaxer and 24 Narc left.

## 2021-05-12 NOTE — Telephone Encounter (Signed)
Post procedure call; voicemail left for patient to call if there are any questions or concerns.

## 2021-05-12 NOTE — Addendum Note (Signed)
Addended by: Gillis Santa on: 05/12/2021 03:23 PM   Modules accepted: Orders

## 2021-05-12 NOTE — Telephone Encounter (Signed)
Called to inform patient that she needs an appt for

## 2021-05-13 ENCOUNTER — Ambulatory Visit: Payer: Medicare PPO | Admitting: Student in an Organized Health Care Education/Training Program

## 2021-05-14 ENCOUNTER — Encounter: Payer: Medicare PPO | Admitting: Student in an Organized Health Care Education/Training Program

## 2021-05-21 ENCOUNTER — Other Ambulatory Visit: Payer: Self-pay

## 2021-05-21 ENCOUNTER — Encounter: Payer: Self-pay | Admitting: Student in an Organized Health Care Education/Training Program

## 2021-05-21 ENCOUNTER — Ambulatory Visit
Payer: Medicare PPO | Attending: Student in an Organized Health Care Education/Training Program | Admitting: Student in an Organized Health Care Education/Training Program

## 2021-05-21 VITALS — BP 158/69 | Temp 97.2°F | Ht 67.5 in | Wt 111.0 lb

## 2021-05-21 DIAGNOSIS — M47816 Spondylosis without myelopathy or radiculopathy, lumbar region: Secondary | ICD-10-CM | POA: Diagnosis not present

## 2021-05-21 DIAGNOSIS — Z79891 Long term (current) use of opiate analgesic: Secondary | ICD-10-CM | POA: Insufficient documentation

## 2021-05-21 DIAGNOSIS — G894 Chronic pain syndrome: Secondary | ICD-10-CM | POA: Diagnosis present

## 2021-05-21 DIAGNOSIS — M792 Neuralgia and neuritis, unspecified: Secondary | ICD-10-CM | POA: Diagnosis not present

## 2021-05-21 DIAGNOSIS — B0229 Other postherpetic nervous system involvement: Secondary | ICD-10-CM | POA: Insufficient documentation

## 2021-05-21 DIAGNOSIS — M47894 Other spondylosis, thoracic region: Secondary | ICD-10-CM | POA: Insufficient documentation

## 2021-05-21 MED ORDER — HYDROCODONE-ACETAMINOPHEN 10-325 MG PO TABS: 1.0000 | ORAL_TABLET | Freq: Two times a day (BID) | ORAL | 0 refills | Status: AC | PRN

## 2021-05-21 MED ORDER — HYDROCODONE-ACETAMINOPHEN 10-325 MG PO TABS: 1.0000 | ORAL_TABLET | Freq: Two times a day (BID) | ORAL | 0 refills | Status: DC | PRN

## 2021-05-21 MED ORDER — TIZANIDINE HCL 4 MG PO TABS: 2.0000 mg | ORAL_TABLET | Freq: Two times a day (BID) | ORAL | 5 refills | Status: DC | PRN

## 2021-05-21 NOTE — Progress Notes (Signed)
PROVIDER NOTE: Information contained herein reflects review and annotations entered in association with encounter. Interpretation of such information and data should be left to medically-trained personnel. Information provided to patient can be located elsewhere in the medical record under "Patient Instructions". Document created using STT-dictation technology, any transcriptional errors that may result from process are unintentional.    Patient: Autumn [name]  Service Category: E/M  Provider: Gillis Santa, MD  DOB: [birth year]  DOS: 05/21/2021  Specialty: Interventional Pain Management  MRN: [id number]  Setting: Ambulatory outpatient  PCP: Baxter Hire, MD  Type: Established Patient    Referring Provider: Baxter Hire, MD  Location: Office  Delivery: Face-to-face     HPI  Autumn [name], a 76 y.o. year old female, is here today because of her Lumbar facet arthropathy [M47.816]. [name] primary complain today is Back Pain Last encounter: My last encounter with her was on 05/11/2021. Pertinent problems: [name] has COPD (chronic obstructive pulmonary disease) ([place]); Post herpetic neuralgia; Pulmonary sarcoidosis ([place]); Chronic pain syndrome; Thoracic radiculopathy; and Lumbar facet arthropathy on their pertinent problem list. Pain Assessment: Severity of Chronic pain is reported as a 8 /10. Location: Back Mid, Left/Radaites from mid left back acrodd rib cage to under left breast area. Onset: More than a month ago. Quality: Constant, Burning, Shooting, Sharp. Timing: Constant. Modifying factor(s): Hydrocodone. Vitals:  height is 5' 7.5" (1.715 m) and weight is 111 lb (50.3 kg). Her temporal temperature is 97.2 F (36.2 C) (abnormal). Her blood pressure is 158/69 (abnormal). Her oxygen saturation is 100%.   Reason for encounter: both, medication management and post-procedure evaluation and assessment.     Post-procedure evaluation    Type: Lumbar Facet, Medial Branch Block(s) #1   Laterality: Bilateral  Level: L3, L4, L5, Medial Branch Level(s). Injecting these levels blocks the L3-4 and L5-S1 lumbar facet joints. Imaging: Fluoroscopic guidance Anesthesia: Local anesthesia (1-2% Lidocaine) Anxiolysis: None                 Sedation: None. DOS: 05/11/2021 Performed by: Gillis Santa, MD  Primary Purpose: Diagnostic/Therapeutic Indications: Low back pain severe enough to impact quality of life or function. 1. Lumbar facet arthropathy   2. Spondylosis without myelopathy or radiculopathy, lumbar region   3. Chronic pain syndrome    NAS-11 Pain score:   Pre-procedure: 8 /10   Post-procedure: 7  (standing and moving)/10      Effectiveness:  Initial hour after procedure: 100 %  Subsequent 4-6 hours post-procedure: 100 %  Analgesia past initial 6 hours: 70 %  Ongoing improvement:  Analgesic:  70-75% Function: improved ROM: Somewhat improved   Pharmacotherapy Assessment  Analgesic Hydrocodone 10 mg BID PRN  Monitoring: [place] PMP: PDMP reviewed during this encounter.       Pharmacotherapy: No side-effects or adverse reactions reported. Compliance: No problems identified. Effectiveness: Clinically acceptable.  Al Decant, RN  05/21/2021 10:26 AM  Sign when Signing Visit Safety precautions to be maintained throughout the outpatient stay will include: orient to surroundings, keep bed in low position, maintain call bell within reach at all times, provide assistance with transfer out of bed and ambulation.    Nursing Pain Medication Assessment:  Safety precautions to be maintained throughout the outpatient stay will include: orient to surroundings, keep bed in low position, maintain call bell within reach at all times, provide assistance with transfer out of bed and ambulation.  Medication Inspection Compliance: Pill count conducted under aseptic conditions, in front of the patient.  Neither the pills nor the bottle was removed from the patient's sight at any time.  Once count was completed pills were immediately returned to the patient in their original bottle.  Medication: Hydrocodone/APAP Pill/Patch Count:  8 of 60 pills remain Pill/Patch Appearance: Markings consistent with prescribed medication Bottle Appearance: Standard pharmacy container. Clearly labeled. Filled Date: 1 / 50 / 2023 Last Medication intake:  Yesterday     UDS:  Summary  Date Value Ref Range Status  04/01/2020 Note  Final    Comment:    ==================================================================== ToxASSURE Select 13 (MW) ==================================================================== Test                             Result       Flag       Units  Drug Present and Declared for Prescription Verification   Hydrocodone                    658          EXPECTED   ng/mg creat   Hydromorphone                  39           EXPECTED   ng/mg creat   Dihydrocodeine                 101          EXPECTED   ng/mg creat   Norhydrocodone                 1868         EXPECTED   ng/mg creat    Sources of hydrocodone include scheduled prescription medications.    Hydromorphone, dihydrocodeine and norhydrocodone are expected    metabolites of hydrocodone. Hydromorphone and dihydrocodeine are    also available as scheduled prescription medications.  ==================================================================== Test                      Result    Flag   Units      Ref Range   Creatinine              154              mg/dL      >=20 ==================================================================== Declared Medications:  The flagging and interpretation on this report are based on the  following declared medications.  Unexpected results may arise from  inaccuracies in the declared medications.   **Note: The testing scope of this panel includes these medications:   Hydrocodone   **Note: The testing scope of this panel does not include the  following reported  medications:   Acetaminophen  Albuterol  Albuterol (Duoneb)  Atenolol  Atorvastatin  Calcium  Cyanocobalamin  Denosumab  Fish Oil  Fluticasone  Fluticasone (Trelegy)  Ipratropium (Duoneb)  Meloxicam (Mobic)  Montelukast (Singulair)  Polyethylene Glycol  Prednisone  Pregabalin  Psyllium  Sertraline  Umeclidinium (Trelegy)  Vilanterol (Trelegy)  Vitamin D3 ==================================================================== For clinical consultation, please call [phone]. ====================================================================      ROS  Constitutional: Denies any fever or chills Gastrointestinal: No reported hemesis, hematochezia, vomiting, or acute GI distress Musculoskeletal:  IMPROVED LBP after L-MBNB BL L3-5 Neurological: No reported episodes of acute onset apraxia, aphasia, dysarthria, agnosia, amnesia, paralysis, loss of coordination, or loss of consciousness  Medication Review  Fluticasone-Umeclidin-Vilant, HYDROcodone-acetaminophen, Propylene Glycol, acetaminophen, albuterol, amLODipine, atenolol, atorvastatin, denosumab, fluticasone, ipratropium-albuterol,  losartan, omeprazole, polyethylene glycol powder, predniSONE, sertraline, and tiZANidine  History Review  Allergy: Ms. Dufresne is allergic to ivp dye [iodinated contrast media]. Drug: Ms. Molitor  reports no history of drug use. Alcohol:  reports no history of alcohol use. Tobacco:  reports that she has never smoked. She has never used smokeless tobacco. Social: Ms. Giebel  reports that she has never smoked. She has never used smokeless tobacco. She reports that she does not drink alcohol and does not use drugs. Medical:  has a past medical history of Allergy, Anemia, Arthritis, COPD (chronic obstructive pulmonary disease) ([place]), Glaucoma, H/O uvulectomy, Hypertension, Osteoporosis, Ovarian cancer ([place]), Postherpetic neuralgia, Pulmonary sarcoidosis ([place]), and Sarcoid. Surgical: Ms. Mckinnie  has  a past surgical history that includes Abdominal hysterectomy; Dilation and curettage of uterus; Vulvar lesion removal; Diagnostic laparoscopy; Eye surgery; Colonoscopy with propofol (N/A, 07/30/2019); and Breast excisional biopsy ([place]). Family: family history includes Breast cancer (age of onset: 40) in her mother; Cancer in her mother; Hypertension in her father.  Laboratory Chemistry Profile   Renal Lab Results  Component Value Date   BUN 29 (H) 10/07/2020   CREATININE 0.72 10/07/2020   GFRAA >60 02/13/2018   GFRNONAA >60 10/07/2020    Hepatic Lab Results  Component Value Date   AST 21 10/07/2020   ALT 17 10/07/2020   ALBUMIN 4.4 10/07/2020   ALKPHOS 33 (L) 10/07/2020   LIPASE 32 10/07/2020    Electrolytes Lab Results  Component Value Date   NA 140 10/07/2020   K 4.0 10/07/2020   CL 98 10/07/2020   CALCIUM 10.0 10/07/2020   MG 2.7 (H) 02/07/2018   PHOS 3.2 02/07/2018    Bone No results found for: VD25OH, MA263FH5KTG, YB6389HT3, SK8768TL5, 25OHVITD1, 25OHVITD2, 25OHVITD3, TESTOFREE, TESTOSTERONE  Inflammation (CRP: Acute Phase) (ESR: Chronic Phase) Lab Results  Component Value Date   ESRSEDRATE 48 (H) 05/25/2011         Note: Above Lab results reviewed.   Physical Exam  General appearance: Well nourished, well developed, and well hydrated. In no apparent acute distress Mental status: Alert, oriented x 3 (person, place, & time)       Respiratory: No evidence of acute respiratory distress Eyes: PERLA Vitals: BP (!) 158/69    Temp (!) 97.2 F (36.2 C) (Temporal)    Ht 5' 7.5" (1.715 m)    Wt 111 lb (50.3 kg) Comment: actual wt   SpO2 100% Comment: 2L of 02   BMI 17.13 kg/m  BMI: Estimated body mass index is 17.13 kg/m as calculated from the following:   Height as of this encounter: 5' 7.5" (1.715 m).   Weight as of this encounter: 111 lb (50.3 kg). Ideal: Ideal body weight: 62.7 kg (138 lb 5.4 oz)  Assessment   Status Diagnosis   Controlled Controlled Controlled 1. Lumbar facet arthropathy   2. Spondylosis without myelopathy or radiculopathy, lumbar region   3. Neuropathic pain   4. Post herpetic neuralgia   5. Thoracic facet joint syndrome   6. Chronic use of opiate for therapeutic purpose   7. Postinfective intercostal neuralgia   8. Chronic pain syndrome        Plan of Care    Ms. LETTI TOWELL has a current medication list which includes the following long-term medication(s): amlodipine, atenolol, atorvastatin, fluticasone, ipratropium-albuterol, omeprazole, sertraline, and losartan.  Pharmacotherapy (Medications Ordered): Meds ordered this encounter  Medications   HYDROcodone-acetaminophen (NORCO) 10-325 MG tablet    Sig: Take 1 tablet by mouth every  12 (twelve) hours as needed for severe pain. Must last 30 days.    Dispense:  60 tablet    Refill:  0    Chronic Pain. (STOP Act - Not applicable). Fill one day early if closed on scheduled refill date.   HYDROcodone-acetaminophen (NORCO) 10-325 MG tablet    Sig: Take 1 tablet by mouth every 12 (twelve) hours as needed for severe pain. Must last 30 days.    Dispense:  60 tablet    Refill:  0    Chronic Pain. (STOP Act - Not applicable). Fill one day early if closed on scheduled refill date.   HYDROcodone-acetaminophen (NORCO) 10-325 MG tablet    Sig: Take 1 tablet by mouth every 12 (twelve) hours as needed for severe pain. Must last 30 days.    Dispense:  60 tablet    Refill:  0    Chronic Pain. (STOP Act - Not applicable). Fill one day early if closed on scheduled refill date.   tiZANidine (ZANAFLEX) 4 MG tablet    Sig: Take 0.5-1 tablets (2-4 mg total) by mouth 2 (two) times daily as needed for muscle spasms.    Dispense:  60 tablet    Refill:  5    Do not place this medication, or any other prescription from our practice, on "Automatic Refill". Patient may have prescription filled one day early if pharmacy is closed on scheduled refill date.    Orders:  Orders Placed This Encounter  Procedures   ToxASSURE Select 13 (MW), Urine    Volume: 30 ml(s). Minimum 3 ml of urine is needed. Document temperature of fresh sample. Indications: Long term (current) use of opiate analgesic [phone])    Order Specific Question:   Release to patient    Answer:   Immediate   Follow-up plan:   Return in about 3 months (around 08/18/2021) for Medication Management, in person.    Recent Visits Date Type Provider Dept  05/11/21 Procedure visit Gillis Santa, MD [facility]  04/28/21 Office Visit Gillis Santa, MD [facility]  03/25/21 Office Visit Gillis Santa, MD [facility]  Showing recent visits within past 90 days and meeting all other requirements Today's Visits Date Type Provider Dept  05/21/21 Office Visit Gillis Santa, MD [facility]  Showing today's visits and meeting all other requirements Future Appointments Date Type Provider Dept  08/18/21 Appointment Gillis Santa, MD [facility]  Showing future appointments within next 90 days and meeting all other requirements  I discussed the assessment and treatment plan with the patient. The patient was provided an opportunity to ask questions and all were answered. The patient agreed with the plan and demonstrated an understanding of the instructions.  Patient advised to call back or seek an in-person evaluation if the symptoms or condition worsens.  Duration of encounter: 71mnutes.  Note by: BGillis Santa MD Date: 05/21/2021; Time: 11:58 AM

## 2021-05-21 NOTE — Progress Notes (Signed)
Safety precautions to be maintained throughout the outpatient stay will include: orient to surroundings, keep bed in low position, maintain call bell within reach at all times, provide assistance with transfer out of bed and ambulation.    Nursing Pain Medication Assessment:  Safety precautions to be maintained throughout the outpatient stay will include: orient to surroundings, keep bed in low position, maintain call bell within reach at all times, provide assistance with transfer out of bed and ambulation.  Medication Inspection Compliance: Pill count conducted under aseptic conditions, in front of the patient. Neither the pills nor the bottle was removed from the patient's sight at any time. Once count was completed pills were immediately returned to the patient in their original bottle.  Medication: Hydrocodone/APAP Pill/Patch Count:  8 of 60 pills remain Pill/Patch Appearance: Markings consistent with prescribed medication Bottle Appearance: Standard pharmacy container. Clearly labeled. Filled Date: 1 / 37 / 2023 Last Medication intake:  Yesterday

## 2021-05-28 LAB — TOXASSURE SELECT 13 (MW), URINE

## 2021-06-08 ENCOUNTER — Telehealth: Payer: Medicare PPO | Admitting: Student in an Organized Health Care Education/Training Program

## 2021-06-09 ENCOUNTER — Other Ambulatory Visit: Payer: Self-pay | Admitting: Student in an Organized Health Care Education/Training Program

## 2021-06-09 ENCOUNTER — Telehealth: Payer: Self-pay | Admitting: Student in an Organized Health Care Education/Training Program

## 2021-06-09 NOTE — Telephone Encounter (Signed)
States pain started to return about 1 week and returned gradually to baseline. Taking medications as prescribed. No new pain complaints. Will send note to Dr. Holley Raring to advise.

## 2021-06-09 NOTE — Telephone Encounter (Signed)
Per Dr. Holley Raring- Schedule patient for VV on 03/02- Patient called and left VM to return call.

## 2021-06-10 ENCOUNTER — Telehealth: Payer: Self-pay

## 2021-06-10 NOTE — Telephone Encounter (Signed)
This has been taken care of.

## 2021-06-11 ENCOUNTER — Ambulatory Visit
Payer: Medicare PPO | Attending: Student in an Organized Health Care Education/Training Program | Admitting: Student in an Organized Health Care Education/Training Program

## 2021-06-11 ENCOUNTER — Other Ambulatory Visit: Payer: Self-pay

## 2021-06-11 ENCOUNTER — Encounter: Payer: Self-pay | Admitting: Student in an Organized Health Care Education/Training Program

## 2021-06-11 DIAGNOSIS — M47816 Spondylosis without myelopathy or radiculopathy, lumbar region: Secondary | ICD-10-CM

## 2021-06-11 DIAGNOSIS — G894 Chronic pain syndrome: Secondary | ICD-10-CM

## 2021-06-11 DIAGNOSIS — M792 Neuralgia and neuritis, unspecified: Secondary | ICD-10-CM

## 2021-06-11 NOTE — Progress Notes (Signed)
Patient: Autumn [name]  Service Category: E/M  Provider: Gillis Santa, MD  DOB: [birth year]  DOS: 06/11/2021  Location: Office  MRN: [id number]  Setting: Ambulatory outpatient  Referring Provider: Baxter Hire, MD  Type: Established Patient  Specialty: Interventional Pain Management  PCP: Baxter Hire, MD  Location: Remote location  Delivery: TeleHealth     Virtual Encounter - Pain Management PROVIDER NOTE: Information contained herein reflects review and annotations entered in association with encounter. Interpretation of such information and data should be left to medically-trained personnel. Information provided to patient can be located elsewhere in the medical record under "Patient Instructions". Document created using STT-dictation technology, any transcriptional errors that may result from process are unintentional.    Contact & Pharmacy Preferred: [phone] Home: [phone] (home) Mobile: [phone] (mobile) E-mail: [email or web address]  [id number] [place] [address]Phone: [phone]: [phone] [id number] [id number]: [phone]: [phone] Pre-screening  Autumn [name] Postffered "in-person" vs "virtual" encounter. She indicated preferring virtual for this encounter.   Reason COVID-19*   Social distancing based on CDC and AMA recommendations.   I contacted Autumn Spitzn 06/11/2021 via telephone.      I clearly identified myself as BiGillis SantaMD. I verified that I was speaking with the correct person using two identifiers (Name: Autumn DUECKERand date of birth: 10October 19, 1947  Consent I sought verbal advanced consent from Autumn Spitzor virtual visit interactions. I informed Autumn [name] possible security and privacy concerns, risks, and limitations associated with providing "not-in-person" medical evaluation  and management services. I also informed Autumn [name] the availability of "in-person" appointments. Finally, I informed her that there would be a charge for the virtual visit and that she could be  personally, fully or partially, financially responsible for it. Autumn [name] understanding and agreed to proceed.   Historic Elements   Ms. Autumn ROMANOs a [age].o. year old, female patient evaluated today after our last contact on 06/09/2021. Ms. CaMcginnesshas a past medical history of Allergy, Anemia, Arthritis, COPD (ch[place] disease) [place] Gl[place], H/O uvulectomy, Hypertension, Osteoporosis, [place] Postherpetic neuralgia, [place]osis [place] and Sarcoid. She also  has a past surgical history that includes Abdominal hysterectomy; Dilation and curettage of uterus; Vulvar lesion removal; Diagnostic laparoscopy; Eye surgery; Colonoscopy with propofol (N/A, 07/30/2019); and Breast excisional biopsy ([place]). Ms. CaKisneras a current medication list which includes the following prescription(s): acetaminophen, albuterol, amlodipine, atenolol, atorvastatin, denosumab, fluticasone, trelegy ellipta, hydrocodone-acetaminophen, [START ON 06/23/2021] hydrocodone-acetaminophen, [START ON 07/23/2021] hydrocodone-acetaminophen, ipratropium-albuterol, losartan, omeprazole, polyethylene glycol powder, prednisone, propylene glycol, sertraline, and tizanidine. She  reports that she has never smoked. She has never used smokeless tobacco. She reports that she does not drink alcohol and does not use drugs. Autumn [name] allergic to ivp dye [iodinated contrast media].   HPI  Today, she is being contacted for worsening of previously known (established) problem  Ms. CaCasertas endorsing increased lower back pain related to lumbar facet arthropathy and lumbar spondylosis.  She is status post diagnostic lumbar facet medial branch nerve blocks #1.  See results below.    Post-procedure  evaluation    Procedure   Type: Lumbar Facet, Medial Branch Block(s) #1  Laterality: Bilateral  Level: L3, L4, L5, Medial  Branch Level(s). Injecting these levels blocks the L3-4 and L5-S1 lumbar facet joints. Imaging: Fluoroscopic guidance Anesthesia: Local anesthesia (1-2% Lidocaine) Anxiolysis: None                 Sedation: None. DOS: 05/11/2021 Performed by: Gillis Santa, MD   Primary Purpose: Diagnostic/Therapeutic Indications: Low back pain severe enough to impact quality of life or function. 1. Lumbar facet arthropathy   2. Spondylosis without myelopathy or radiculopathy, lumbar region   3. Chronic pain syndrome     NAS-11 Pain score:        Pre-procedure: 8 /10        Post-procedure: 7  (standing and moving)/10       Effectiveness:  Initial hour after procedure: 100 %  Subsequent 4-6 hours post-procedure: 100 %  Analgesia past initial 6 hours: 70 %-75% for the first 4 weeks with gradual return of pain  Ongoing improvement:  Analgesic:  currently at 35% Function: improved ROM: Somewhat improved       Laboratory Chemistry Profile   Renal Lab Results  Component Value Date   BUN 29 (H) 10/07/2020   CREATININE 0.72 10/07/2020   GFRAA >60 02/13/2018   GFRNONAA >60 10/07/2020    Hepatic Lab Results  Component Value Date   AST 21 10/07/2020   ALT 17 10/07/2020   ALBUMIN 4.4 10/07/2020   ALKPHOS 33 (L) 10/07/2020   LIPASE 32 10/07/2020    Electrolytes Lab Results  Component Value Date   NA 140 10/07/2020   K 4.0 10/07/2020   CL 98 10/07/2020   CALCIUM 10.0 10/07/2020   MG 2.7 (H) 02/07/2018   PHOS 3.2 02/07/2018    Bone No results found for: VD25OH, [id number], FF6384YK5, LD3570VX7, 25OHVITD1, 25OHVITD2, 25OHVITD3, TESTOFREE, TESTOSTERONE  Inflammation (CRP: Acute Phase) (ESR: Chronic Phase) Lab Results  Component Value Date   ESRSEDRATE 48 (H) 05/25/2011         Note: Above Lab results reviewed.   Assessment  The primary encounter diagnosis  was Lumbar facet arthropathy. Diagnoses of Spondylosis without myelopathy or radiculopathy, lumbar region, Neuropathic pain, and Chronic pain syndrome were also pertinent to this visit.  Plan of Care    Return and increase of lower back pain related to lumbar facet arthropathy and lumbar spondylosis.  Status post bilateral diagnostic lumbar facet medial branch nerve block done on 05/11/2021 that provided her with approximately 75% pain relief for 4 weeks.  She now she is having increased pain that is worse with lumbar extension.  We discussed repeating lumbar facet medial branch nerve block #2 if similar results, we can plan for lumbar radiofrequency ablation for the purpose of obtaining longer-term pain relief in the management of her lower back pain secondary to lumbar facet arthropathy, lumbar spondylosis.  Risk and benefits reviewed and patient like to proceed.  Orders:  Orders Placed This Encounter  Procedures   LUMBAR FACET(MEDIAL BRANCH NERVE BLOCK) MBNB    Standing Status:   Future    Standing Expiration Date:   07/12/2021    Scheduling Instructions:     Procedure: Lumbar facet block (AKA.: Lumbosacral medial branch nerve block)     Side: Bilateral     Level: L3-4, L4-5, Facets (L3, L4, L5, Medial Branch Nerves)     Sedation: without     Timeframe: ASAA    Order Specific Question:   Where will this procedure be performed?    Answer:   [facility]   Follow-up plan:   Return in  about 2 weeks (around 06/25/2021) for B/L L3, 4, 5 Fcts #2, in clinic NS.    Recent Visits Date Type Provider Dept  05/21/21 Office Visit Gillis Santa, MD [facility]  05/11/21 Procedure visit Gillis Santa, MD [facility]  04/28/21 Office Visit Gillis Santa, MD [facility]  03/25/21 Office Visit Gillis Santa, MD [facility]  Showing recent visits within past 90 days and meeting all other requirements Today's Visits Date Type Provider Dept  06/11/21 Office  Visit Gillis Santa, MD [facility]  Showing today's visits and meeting all other requirements Future Appointments Date Type Provider Dept  08/18/21 Appointment Gillis Santa, MD [facility]  Showing future appointments within next 90 days and meeting all other requirements  I discussed the assessment and treatment plan with the patient. The patient was provided an opportunity to ask questions and all were answered. The patient agreed with the plan and demonstrated an understanding of the instructions.  Patient advised to call back or seek an in-person evaluation if the symptoms or condition worsens.  Duration of encounter: 26mnutes.  Note by: BGillis Santa MD Date: 06/11/2021; Time: 2:50 PM

## 2021-06-24 ENCOUNTER — Ambulatory Visit
Admission: RE | Admit: 2021-06-24 | Discharge: 2021-06-24 | Disposition: A | Discharge status: Home / Self Care | Payer: Medicare PPO | Source: Ambulatory Visit | Attending: Student in an Organized Health Care Education/Training Program | Admitting: Student in an Organized Health Care Education/Training Program

## 2021-06-24 ENCOUNTER — Encounter: Payer: Self-pay | Admitting: Student in an Organized Health Care Education/Training Program

## 2021-06-24 ENCOUNTER — Other Ambulatory Visit: Payer: Self-pay

## 2021-06-24 ENCOUNTER — Ambulatory Visit (HOSPITAL_BASED_OUTPATIENT_CLINIC_OR_DEPARTMENT_OTHER): Payer: Medicare PPO | Admitting: Student in an Organized Health Care Education/Training Program

## 2021-06-24 DIAGNOSIS — M47816 Spondylosis without myelopathy or radiculopathy, lumbar region: Secondary | ICD-10-CM | POA: Insufficient documentation

## 2021-06-24 DIAGNOSIS — G894 Chronic pain syndrome: Secondary | ICD-10-CM | POA: Insufficient documentation

## 2021-06-24 MED ORDER — DEXAMETHASONE SODIUM PHOSPHATE 10 MG/ML IJ SOLN
10.0000 mg | Freq: Once | INTRAMUSCULAR | Status: AC
  Administered 2021-06-24: 10 mg
  Filled 2021-06-24: qty 1

## 2021-06-24 MED ORDER — LIDOCAINE HCL 2 % IJ SOLN
20.0000 mL | Freq: Once | INTRAMUSCULAR | Status: AC
  Administered 2021-06-24: 400 mg
  Filled 2021-06-24: qty 40

## 2021-06-24 MED ORDER — ROPIVACAINE HCL 2 MG/ML IJ SOLN
9.0000 mL | Freq: Once | INTRAMUSCULAR | Status: AC
  Administered 2021-06-24: 9 mL via PERINEURAL
  Filled 2021-06-24: qty 20

## 2021-06-24 NOTE — Progress Notes (Signed)
PROVIDER NOTE: Interpretation of information contained herein should be left to medically-trained personnel. Specific patient instructions are provided elsewhere under "Patient Instructions" section of medical record. This document was created in part using STT-dictation technology, any transcriptional errors that may result from this process are unintentional.  ?Patient: Autumn [name] ?Type: Established ?DOB: August 05, 1945 ?MRN: [id number] ?PCP: Baxter Hire, MD  Service: Procedure ?DOS: 06/24/2021 ?Setting: Ambulatory ?Location: Ambulatory outpatient facility ?Delivery: Face-to-face Provider: Gillis Santa, MD ?Specialty: Interventional Pain Management ?Specialty designation: 09 ?Location: Outpatient facility ?Ref. Prov.: Gillis Santa, MD   ? ?Primary Reason for Visit: Interventional Pain Management Treatment. ?CC: Back Pain (low) ?  ?Procedure:          ? Type: Lumbar Facet, Medial Branch Block(s) #2  ?Laterality: Bilateral  ?Level: L3, L4, L5, Medial Branch Level(s). Injecting these levels blocks the L3-4 and L5-S1 lumbar facet joints. ?Imaging: Fluoroscopic guidance ?Anesthesia: Local anesthesia (1-2% Lidocaine) ?Anxiolysis: None                 ?Sedation: None. ?DOS: 06/24/2021 ?Performed by: Gillis Santa, MD ? ?Primary Purpose: Diagnostic/Therapeutic ?Indications: Low back pain severe enough to impact quality of life or function. ?1. 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 Chronic pain syndrome   ? ?NAS-11 Pain score:  ? Pre-procedure: 6 /10  ? Post-procedure: 6 /10  ? ?  ?Position / Prep / Materials:  ?Position: Prone  ?Prep solution: DuraPrep (Iodine Povacrylex [0.7% available iodine] and Isopropyl Alcohol, 74% w/w) ?Area Prepped: Posterolateral Lumbosacral Spine (Wide prep: From the lower border of the scapula down to the end of the tailbone and from flank to flank.) ?Pre-op H&P Assessment:  ?Autumn [name] is a 76 y.o. (year old), female patient, seen  today for interventional treatment. She  has a past surgical history that includes Abdominal hysterectomy; Dilation and curettage of uterus; Vulvar lesion removal; Diagnostic laparoscopy; Eye surgery; Colonoscopy with propofol (N/A, 07/30/2019); and Breast excisional biopsy (Right). Autumn [name] has a current medication list which includes the following prescription(s): acetaminophen, albuterol, amlodipine, atenolol, atorvastatin, denosumab, fluticasone, trelegy ellipta, hydrocodone-acetaminophen, [START ON 07/23/2021] hydrocodone-acetaminophen, ipratropium-albuterol, losartan, omeprazole, polyethylene glycol powder, prednisone, propylene glycol, sertraline, and tizanidine. Her primarily concern today is the Back Pain (low) ? ?Initial Vital Signs:  ?Pulse/HCG Rate: 74ECG Heart Rate: 75 ?Temp:  ?(!) 96.5 ?F (35.8 ?C) ?Resp: 16 ?BP:  ?(!) 181/78 ?SpO2: 100 % (o2'@2L'$  [place]) ? ?BMI: Estimated body mass index is 18.08 kg/m? as calculated from the following: ?  Height as of this encounter: '5\' 6"'$  (1.676 m). ?  Weight as of this encounter: 112 lb (50.8 kg). ? ?Risk Assessment: ?Allergies: Reviewed. She is allergic to ivp dye [iodinated contrast media].  ?Allergy Precautions: None required ?Coagulopathies: Reviewed. None identified.  ?Blood-thinner therapy: None at this time ?Active Infection(s): Reviewed. None identified. Autumn [name] is afebrile ? ?Site Confirmation: Autumn [name] was asked to confirm the procedure and laterality before marking the site ?Procedure checklist: Completed ?Consent: Before the procedure and under the influence of no sedative(s), amnesic(s), or anxiolytics, the patient was informed of the treatment options, risks and possible complications. To fulfill our ethical and legal obligations, as recommended by the American Medical Association's Code of Ethics, I have informed the patient of my clinical impression; the nature and purpose of the treatment or procedure; the risks, benefits, and possible complications  of the intervention; the alternatives, including doing nothing; the risk(s) and benefit(s) of the alternative treatment(s) or  procedure(s); and the risk(s) and benefit(s) of doing nothing. ?The patient was provided information about the general risks and possible complications associated with the procedure. These may include, but are not limited to: failure to achieve desired goals, infection, bleeding, organ or nerve damage, allergic reactions, paralysis, and death. ?In addition, the patient was informed of those risks and complications associated to Spine-related procedures, such as failure to decrease pain; infection (i.e.: Meningitis, epidural or intraspinal abscess); bleeding (i.e.: epidural hematoma, subarachnoid hemorrhage, or any other type of intraspinal or peri-dural bleeding); organ or nerve damage (i.e.: Any type of peripheral nerve, nerve root, or spinal cord injury) with subsequent damage to sensory, motor, and/or autonomic systems, resulting in permanent pain, numbness, and/or weakness of one or several areas of the body; allergic reactions; (i.e.: anaphylactic reaction); and/or death. ?Furthermore, the patient was informed of those risks and complications associated with the medications. These include, but are not limited to: allergic reactions (i.e.: anaphylactic or anaphylactoid reaction(s)); adrenal axis suppression; blood sugar elevation that in diabetics may result in ketoacidosis or comma; water retention that in patients with history of congestive heart failure may result in shortness of breath, pulmonary edema, and decompensation with resultant heart failure; weight gain; swelling or edema; medication-induced neural toxicity; particulate matter embolism and blood vessel occlusion with resultant organ, and/or nervous system infarction; and/or aseptic necrosis of one or more joints. ?Finally, the patient was informed that Medicine is not an exact science; therefore, there is also the  possibility of unforeseen or unpredictable risks and/or possible complications that may result in a catastrophic outcome. The patient indicated having understood very clearly. We have given the patient no guarantees and we have made no promises. Enough time was given to the patient to ask questions, all of which were answered to the patient's satisfaction. Ms. Fehrman has indicated that she wanted to continue with the procedure. ?Attestation: I, the ordering provider, attest that I have discussed with the patient the benefits, risks, side-effects, alternatives, likelihood of achieving goals, and potential problems during recovery for the procedure that I have provided informed consent. ?Date  Time: 06/24/2021 12:30 PM ? ?Pre-Procedure Preparation:  ?Monitoring: As per clinic protocol. Respiration, ETCO2, SpO2, BP, heart rate and rhythm monitor placed and checked for adequate function ?Safety Precautions: Patient was assessed for positional comfort and pressure points before starting the procedure. ?Time-out: I initiated and conducted the "Time-out" before starting the procedure, as per protocol. The patient was asked to participate by confirming the accuracy of the "Time Out" information. Verification of the correct person, site, and procedure were performed and confirmed by me, the nursing staff, and the patient. "Time-out" conducted as per Joint Commission's Universal Protocol ([id number].01.01.01). ?Time: 1252 ? ?Description of Procedure:          ?Laterality: Bilateral. The procedure was performed in identical fashion on both sides. ?Levels: L3, L4, L5, Medial Branch Level(s) ? ?Safety Precautions: Aspiration looking for blood return was conducted prior to all injections. At no point did we inject any substances, as a needle was being advanced. Before injecting, the patient was told to immediately notify me if she was experiencing any new onset of "ringing in the ears, or metallic taste in the mouth". No attempts were  made at seeking any paresthesias. Safe injection practices and needle disposal techniques used. Medications properly checked for expiration dates. SDV (single dose vial) medications used. After the completion of

## 2021-06-24 NOTE — Patient Instructions (Signed)
SPINAL STENOSIS ? ? ? ?Pain Management Discharge Instructions ? ?General Discharge Instructions : ? ?If you need to reach your doctor call: Monday-Friday 8:00 am - 4:00 pm at [phone] or toll free [phone].  After clinic hours [phone] to have operator reach doctor. ? ?Bring all of your medication bottles to all your appointments in the pain clinic. ? ?To cancel or reschedule your appointment with Pain Management please remember to call 24 hours in advance to avoid a fee. ? ?Refer to the educational materials which you have been given on: General Risks, I had my Procedure. Discharge Instructions, Post Sedation. ? ?Post Procedure Instructions: ? ?The drugs you were given will stay in your system until tomorrow, so for the next 24 hours you should not drive, make any legal decisions or drink any alcoholic beverages. ? ?You may eat anything you prefer, but it is better to start with liquids then soups and crackers, and gradually work up to solid foods. ? ?Please notify your doctor immediately if you have any unusual bleeding, trouble breathing or pain that is not related to your normal pain. ? ?Depending on the type of procedure that was done, some parts of your body may feel week and/or numb.  This usually clears up by tonight or the next day. ? ?Walk with the use of an assistive device or accompanied by an adult for the 24 hours. ? ?You may use ice on the affected area for the first 24 hours.  Put ice in a Ziploc bag and cover with a towel and place against area 15 minutes on 15 minutes off.  You may switch to heat after 24 hours.Facet Blocks ?Patient Information ? ?Description: The facets are joints in the spine between the vertebrae.  Like any joints in the body, facets can become irritated and painful.  Arthritis can also effect the facets.  By injecting steroids and local anesthetic in and around these joints, we can temporarily block the nerve supply to them.  Steroids act directly on irritated  nerves and tissues to reduce selling and inflammation which often leads to decreased pain.  Facet blocks may be done anywhere along the spine from the neck to the low back depending upon the location of your pain. ?  After numbing the skin with local anesthetic (like Novocaine), a small needle is passed onto the facet joints under x-ray guidance.  You may experience a sensation of pressure while this is being done.  The entire block usually lasts about 15-25 minutes.  ? ?Conditions which may be treated by facet blocks: ? ?Low back/buttock pain ?Neck/shoulder pain ?Certain types of headaches ? ?Preparation for the injection: ? ?Do not eat any solid food or dairy products within 8 hours of your appointment. ?You may drink clear liquid up to 3 hours before appointment.  Clear liquids include water, black coffee, juice or soda.  No milk or cream please. ?You may take your regular medication, including pain medications, with a sip of water before your appointment.  Diabetics should hold regular insulin (if taken separately) and take 1/2 normal NPH dose the morning of the procedure.  Carry some sugar containing items with you to your appointment. ?A driver must accompany you and be prepared to drive you home after your procedure. ?Bring all your current medications with you. ?An IV may be inserted and sedation may be given at the discretion of the physician. ?A blood pressure cuff, EKG and other monitors will often be applied during the procedure.  Some patients may need to have extra oxygen administered for a short period. ?You will be asked to provide medical information, including your allergies and medications, prior to the procedure.  We must know immediately if you are taking blood thinners (like Coumadin/Warfarin) or if you are allergic to IV iodine contrast (dye).  We must know if you could possible be pregnant. ? ?Possible side-effects: ? ?Bleeding from needle site ?Infection (rare, may require surgery) ?Nerve  injury (rare) ?Numbness & tingling (temporary) ?Difficulty urinating (rare, temporary) ?Spinal headache (a headache worse with upright posture) ?Light-headedness (temporary) ?Pain at injection site (serveral days) ?Decreased blood pressure (rare, temporary) ?Weakness in arm/leg (temporary) ?Pressure sensation in back/neck (temporary) ? ? ?Call if you experience: ? ?Fever/chills associated with headache or increased back/neck pain ?Headache worsened by an upright position ?New onset, weakness or numbness of an extremity below the injection site ?Hives or difficulty breathing (go to the emergency room) ?Inflammation or drainage at the injection site(s) ?Severe back/neck pain greater than usual ?New symptoms which are concerning to you ? ?Please note: ? ?Although the local anesthetic injected can often make your back or neck feel good for several hours after the injection, the pain will likely return. It takes 3-7 days for steroids to work.  You may not notice any pain relief for at least one week. ? ?If effective, we will often do a series of 2-3 injections spaced 3-6 weeks apart to maximally decrease your pain.  After the initial series, you may be a candidate for a more permanent nerve block of the facets. ? ?If you have any questions, please call #[phone] ?[facility] ?

## 2021-06-24 NOTE — Progress Notes (Signed)
Safety precautions to be maintained throughout the outpatient stay will include: orient to surroundings, keep bed in low position, maintain call bell within reach at all times, provide assistance with transfer out of bed and ambulation.  

## 2021-06-25 ENCOUNTER — Telehealth: Payer: Self-pay | Admitting: *Deleted

## 2021-06-25 ENCOUNTER — Other Ambulatory Visit: Payer: Self-pay | Admitting: *Deleted

## 2021-06-25 NOTE — Telephone Encounter (Signed)
Called for post procedure check. No answer. LVM. 

## 2021-06-26 ENCOUNTER — Other Ambulatory Visit: Payer: Self-pay

## 2021-06-26 DIAGNOSIS — J449 Chronic obstructive pulmonary disease, unspecified: Secondary | ICD-10-CM | POA: Insufficient documentation

## 2021-06-26 DIAGNOSIS — I10 Essential (primary) hypertension: Secondary | ICD-10-CM | POA: Diagnosis not present

## 2021-06-26 DIAGNOSIS — K5903 Drug induced constipation: Secondary | ICD-10-CM | POA: Insufficient documentation

## 2021-06-26 DIAGNOSIS — R1030 Lower abdominal pain, unspecified: Secondary | ICD-10-CM | POA: Diagnosis present

## 2021-06-26 DIAGNOSIS — T50Z95A Adverse effect of other vaccines and biological substances, initial encounter: Secondary | ICD-10-CM | POA: Diagnosis not present

## 2021-06-26 LAB — COMPREHENSIVE METABOLIC PANEL
ALT: 23 U/L (ref 0–44)
AST: 25 U/L (ref 15–41)
Albumin: 4.6 g/dL (ref 3.5–5.0)
Alkaline Phosphatase: 42 U/L (ref 38–126)
Anion gap: 10 (ref 5–15)
BUN: 29 mg/dL — ABNORMAL HIGH (ref 8–23)
CO2: 34 mmol/L — ABNORMAL HIGH (ref 22–32)
Calcium: 9.5 mg/dL (ref 8.9–10.3)
Chloride: 94 mmol/L — ABNORMAL LOW (ref 98–111)
Creatinine, Ser: 0.79 mg/dL (ref 0.44–1.00)
GFR, Estimated: >60 mL/min (ref >60)
Glucose, Bld: 118 mg/dL — ABNORMAL HIGH (ref 70–99)
Potassium: 3.7 mmol/L (ref 3.5–5.1)
Sodium: 138 mmol/L (ref 135–145)
Total Bilirubin: 0.7 mg/dL (ref 0.3–1.2)
Total Protein: 7.4 g/dL (ref 6.5–8.1)

## 2021-06-26 LAB — CBC
HCT: 36 % (ref 36.0–46.0)
Hemoglobin: 10.8 g/dL — ABNORMAL LOW (ref 12.0–15.0)
MCH: 26.9 pg (ref 26.0–34.0)
MCHC: 30 g/dL (ref 30.0–36.0)
MCV: 89.8 fL (ref 80.0–100.0)
Platelets: 327 10*3/uL (ref 150–400)
RBC: 4.01 MIL/uL (ref 3.87–5.11)
RDW: 13.1 % (ref 11.5–15.5)
WBC: 13.9 10*3/uL — ABNORMAL HIGH (ref 4.0–10.5)
nRBC: 0 % (ref 0.0–0.2)

## 2021-06-26 NOTE — ED Triage Notes (Signed)
Pt states lower abd pain with vomiting and diarrhea since 1700. Pt states has had burning with urination, denies known fever.  ?

## 2021-06-27 ENCOUNTER — Emergency Department: Payer: Medicare PPO

## 2021-06-27 ENCOUNTER — Emergency Department
Admission: EM | Admit: 2021-06-27 | Discharge: 2021-06-27 | Disposition: A | Discharge status: Home / Self Care | Payer: Medicare PPO | Attending: Emergency Medicine | Admitting: Emergency Medicine

## 2021-06-27 DIAGNOSIS — R1030 Lower abdominal pain, unspecified: Secondary | ICD-10-CM

## 2021-06-27 DIAGNOSIS — K5903 Drug induced constipation: Secondary | ICD-10-CM

## 2021-06-27 LAB — URINALYSIS, ROUTINE W REFLEX MICROSCOPIC
Bacteria, UA: NONE SEEN
Bilirubin Urine: NEGATIVE
Glucose, UA: NEGATIVE mg/dL
Hgb urine dipstick: NEGATIVE
Ketones, ur: NEGATIVE mg/dL
Nitrite: NEGATIVE
Protein, ur: NEGATIVE mg/dL
Specific Gravity, Urine: 1.009 (ref 1.005–1.030)
pH: 8 (ref 5.0–8.0)

## 2021-06-27 LAB — LIPASE, BLOOD: Lipase: 32 U/L (ref 11–51)

## 2021-06-27 MED ORDER — ONDANSETRON 4 MG PO TBDP: 4.0000 mg | ORAL_TABLET | Freq: Three times a day (TID) | ORAL | 0 refills | Status: DC | PRN

## 2021-06-27 NOTE — Discharge Instructions (Signed)
Please pick up some MiraLAX to have around considering your predisposition to constipation with your pain medications. ? ?I sent a prescription for Zofran nausea medicine to have available at home to use as needed for any further nausea. ? ?Please return to the ED with any further worsening symptoms ?

## 2021-06-27 NOTE — ED Provider Notes (Signed)
? ?[facility] ?Provider Note ? ? ? Event Date/Time  ? First MD Initiated Contact with Patient 06/27/21 0131   ?  (approximate) ? ? ?History  ? ?Abdominal Pain ? ? ?HPI ? ?Autumn [name] is a 76 y.o. female who presents to the ED for evaluation of Abdominal Pain ?  ?I reviewed PCP visit from 1/9.  History of chronic lumbar pain, following with pain management.  COPD on 2 L home O2.  HTN, sarcoidosis, depression, anxiety.  ?History of hysterectomy, SBO requiring ex lap. ? ?Patient presents to the ED, accompanied by her son, for evaluation of lower abdominal pain with nausea and an episode of emesis.  Reports chronic constipation that she attributes to her chronic opiates, and recently running out of her MiraLAX at home.  She is supplemented with Metamucil.  ? ?Reports developing generalized lower abdominal discomfort with associated nausea this evening.  She reports 1 episode of nonbloody nonbilious emesis.  She reports eventually passing a bowel movement and feeling better.  When I see her, she reports no pain and says she feels fine.  Denies any further nausea, emesis or pain since she passed a bowel movement. ? ?Of note, she is triaged as having dysuria, but denies this with me.  She reports voiding at baseline without dysuria, hematuria or frequency. ? ?Physical Exam  ? ?Triage Vital Signs: ?ED Triage Vitals  ?Enc Vitals Group  ?   BP 06/26/21 2158 (!) 181/80  ?   Pulse Rate 06/26/21 2159 81  ?   Resp 06/26/21 2158 20  ?   Temp 06/26/21 2158 99.4 ?F (37.4 ?C)  ?   Temp Source 06/26/21 2158 Oral  ?   SpO2 06/26/21 2158 100 %  ?   Weight 06/26/21 2157 112 lb (50.8 kg)  ?   Height 06/26/21 2157 '5\' 6"'$  (1.676 m)  ?   Head Circumference --   ?   Peak Flow --   ?   Pain Score 06/26/21 2157 9  ?   Pain Loc --   ?   Pain Edu? --   ?   Excl. in [place]? --   ? ? ?Most recent vital signs: ?Vitals:  ? 06/26/21 2351 06/27/21 0220  ?BP: (!) 158/81 (!) 163/74  ?Pulse: 74 81  ?Resp: 18 18  ?Temp:    ?SpO2: 100%  100%  ? ? ?General: Awake, no distress.  Pleasant and conversational. ?CV:  Good peripheral perfusion.  ?Resp:  Normal effort.  On 2 L nasal cannula at baseline.  No distress. ?Abd:  No distention.  Mild diffuse lower abdominal tenderness without localizing features.  No peritoneal features or guarding.  Benign upper abdomen. ?MSK:  No deformity noted.  ?Neuro:  No focal deficits appreciated. ?Other:   ? ? ?ED Results / Procedures / Treatments  ? ?Labs ?(all labs ordered are listed, but only abnormal results are displayed) ?Labs Reviewed  ?COMPREHENSIVE METABOLIC PANEL - Abnormal; Notable for the following components:  ?    Result Value  ? Chloride 94 (*)   ? CO2 34 (*)   ? Glucose, Bld 118 (*)   ? BUN 29 (*)   ? All other components within normal limits  ?CBC - Abnormal; Notable for the following components:  ? WBC 13.9 (*)   ? Hemoglobin 10.8 (*)   ? All other components within normal limits  ?URINALYSIS, ROUTINE W REFLEX MICROSCOPIC - Abnormal; Notable for the following components:  ? Color, Urine YELLOW (*)   ?  APPearance CLEAR (*)   ? Leukocytes,Ua SMALL (*)   ? All other components within normal limits  ?LIPASE, BLOOD  ? ? ?EKG ? ? ?RADIOLOGY ?CT abdomen/pelvis reviewed by me without evidence of SBO ? ?Official radiology report(s): ?CT ABDOMEN PELVIS WO CONTRAST ? ?Result Date: 06/27/2021 ?CLINICAL DATA:  Small-bowel obstruction, nausea, vomiting, lower abdominal pain. EXAM: CT ABDOMEN AND PELVIS WITHOUT CONTRAST TECHNIQUE: Multidetector CT imaging of the abdomen and pelvis was performed following the standard protocol without IV contrast. RADIATION DOSE REDUCTION: This exam was performed according to the departmental dose-optimization program which includes automated exposure control, adjustment of the mA and/or kV according to patient size and/or use of iterative reconstruction technique. COMPARISON:  10/07/2020 FINDINGS: Lower chest: Moderate coronary artery calcification. Small pericardial effusion,  unchanged. Hepatobiliary: No focal liver abnormality is seen. No gallstones, gallbladder wall thickening, or biliary dilatation. Pancreas: Unremarkable Spleen: Unremarkable Adrenals/Urinary Tract: The adrenal glands are unremarkable. The kidneys are normal in size and position. And exophytic 14 mm lesion is seen arising from the interpolar region of the left kidney, unchanged from prior examination, and better assessed on prior sonogram of 09/11/2018 and compatible with a hyperdense cyst. There are multiple nonobstructing calculi within the kidneys bilaterally measuring up to 5 mm within the lower pole of the left kidney and 3 mm within the interpolar region of the right kidney. Trace right hydronephrosis appears improved. No ureteral calculi. The bladder is unremarkable. Stomach/Bowel: Mild ascites is present, new since prior examination. The stomach, small bowel, and large bowel are unremarkable. No evidence of obstruction or focal inflammation. Appendix normal. No free intraperitoneal gas. Vascular/Lymphatic: Extensive aortoiliac atherosclerotic calcification. No aortic aneurysm. No pathologic adenopathy within the abdomen and pelvis. Reproductive: Status post hysterectomy. No adnexal masses. Other: No abdominal wall hernia Musculoskeletal: No acute bone abnormality. Moderate lumbar levoscoliosis. Osseous structures are otherwise age-appropriate. No lytic or blastic bone lesion. IMPRESSION: No evidence of obstruction. Interval development of mild hydronephrosis, nonspecific. Moderate coronary artery calcification. Mild bilateral nonobstructing nephrolithiasis. Improved trace right hydronephrosis. No urolithiasis. Stable 14 mm hyperdense cyst involving the interpolar region of the left kidney. Aortic Atherosclerosis (ICD10-I70.0). Electronically Signed   By: Fidela Salisbury M.D.   On: 06/27/2021 03:03   ? ?PROCEDURES and INTERVENTIONS: ? ?Procedures ? ?Medications - No data to display ? ? ?IMPRESSION / MDM /  ASSESSMENT AND PLAN / ED COURSE  ?I reviewed the triage vital signs and the nursing notes. ? ?Pleasant 76 year old female presents to the ED with lower abdominal pain, possibly due to constipation, and suitable for outpatient management.  She looks well to me systemically, but has some lower abdominal discomfort on palpation without peritoneal features or guarding.  Blood work with mild nonspecific leukocytosis.  Metabolic panel is at baseline.  Lipase is normal and urine without infectious features or signs of dehydration.  Due to her surgical history and tenderness, CT abdomen/pelvis is obtained and demonstrates no acute features such as SBO.  Nonspecific mild hydroureter is noted.  She may have passed a ureteral stone, but most likely was just constipated.  She has no symptoms or complaints here in the ED and is requesting discharge, which I think is reasonable.  We discussed management at home. ? ?Clinical Course as of 06/27/21 0335  ?Sat Jun 27, 2021  ?[place] . Updated family of reassuring CT. Discussed nonspecific hydroureter [DS]  ?47 Reassessed and educated patient reassuring urine.  We discussed CT results.  We discussed hydroureter.  We discussed management of her  symptoms at home and return precautions.  She is eager to go home. [DS]  ?  ?Clinical Course User Index ?[DS] Vladimir Crofts, MD  ? ? ? ?FINAL CLINICAL IMPRESSION(S) / ED DIAGNOSES  ? ?Final diagnoses:  ?Lower abdominal pain  ?Drug-induced constipation  ? ? ? ?Rx / DC Orders  ? ?ED Discharge Orders   ? ?      Ordered  ?  ondansetron (ZOFRAN-ODT) 4 MG disintegrating tablet  Every 8 hours PRN       ? 06/27/21 0335  ? ?  ?  ? ?  ? ? ? ?Note:  This document was prepared using Dragon voice recognition software and may include unintentional dictation errors. ?  ?Vladimir Crofts, MD ?06/27/21 [phone] ? ?

## 2021-06-27 NOTE — ED Notes (Signed)
Presents to the ED with abd pain, N/V since 1 pm yesterday afternoon. Pain in the LLQ. Tender with palpations. Had a little bit of diarrhea unsure if blood in stool. Denies any urinary s/s.  ?

## 2021-07-14 ENCOUNTER — Encounter: Payer: Self-pay | Admitting: Student in an Organized Health Care Education/Training Program

## 2021-07-14 ENCOUNTER — Ambulatory Visit
Payer: Medicare PPO | Attending: Student in an Organized Health Care Education/Training Program | Admitting: Student in an Organized Health Care Education/Training Program

## 2021-07-14 DIAGNOSIS — M47816 Spondylosis without myelopathy or radiculopathy, lumbar region: Secondary | ICD-10-CM

## 2021-07-14 DIAGNOSIS — G894 Chronic pain syndrome: Secondary | ICD-10-CM

## 2021-07-14 NOTE — Progress Notes (Signed)
Patient: KASHAWN MANZANO  Service Category: E/M  Provider: Gillis Santa, MD  ?DOB: Feb 22, 1946  DOS: 07/14/2021  Location: Office  ?MRN: [id number]  Setting: Ambulatory outpatient  Referring Provider: Baxter Hire, MD  ?Type: Established Patient  Specialty: Interventional Pain Management  PCP: Gladstone Lighter, MD  ?Location: Remote location  Delivery: TeleHealth    ? ?Virtual Encounter - Pain Management ?PROVIDER NOTE: Information contained herein reflects review and annotations entered in association with encounter. Interpretation of such information and data should be left to medically-trained personnel. Information provided to patient can be located elsewhere in the medical record under "Patient Instructions". Document created using STT-dictation technology, any transcriptional errors that may result from process are unintentional.  ?  ?Contact & Pharmacy ?Preferred: [phone] ?Home: [phone] (home) ?Mobile: [phone] (mobile) ?E-mail: [email or web address]  ?[id number] [place], [place] - [address] ?[address] ?[id number]?Phone: [phone]: [phone]? ?[id number] [place] [facility] [address] ?[address] ?[id number]: [phone]: [phone]  ?Pre-screening  ?[name] "in-person" vs "virtual" encounter. She indicated preferring virtual for this encounter.  ? ?Reason ?COVID-19*  Social distancing based on CDC and AMA recommendations.  ? ?I contacted HaGwenith Spitzn 07/14/2021 via telephone.      I clearly identified myself as BiGillis SantaMD. I verified that I was speaking with the correct person using two identifiers (Name: HaAUDERY WASSENAARand date of birth: [birth year] ? ?Consent ?I sought verbal advanced consent from HaGwenith Spitzor virtual visit interactions. I informed Ms. CaChainf possible security and privacy concerns, risks, and limitations associated with providing "not-in-person" medical  evaluation and management services. I also informed Ms. CaKellf the availability of "in-person" appointments. Finally, I informed her that there would be a charge for the virtual visit and that she could be  personally, fully or partially, financially responsible for it. Ms. CaHunsuckerxpressed understanding and agreed to proceed.  ? ?Historic Elements   ?Ms. HaDODI LEUs a [age].o. year old, female patient evaluated today after our last contact on 06/24/2021. Ms. 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 She also  has a past surgical history that includes Abdominal hysterectomy; Dilation and curettage of uterus; Vulvar lesion removal; Diagnostic laparoscopy; Eye surgery; Colonoscopy with propofol (N/A, 07/30/2019); and Breast excisional biopsy (Right). Ms. CaWylyas a current medication list which includes the following prescription(s): acetaminophen, albuterol, amlodipine, atenolol, atorvastatin, denosumab, fluticasone, trelegy ellipta, hydrocodone-acetaminophen, [START ON 07/23/2021] hydrocodone-acetaminophen, ipratropium-albuterol, losartan, omeprazole, ondansetron, polyethylene glycol powder, prednisone, propylene glycol, and sertraline. She  reports that she has never smoked. She has never used smokeless tobacco. She reports that she does not drink alcohol and does not use drugs. Ms. CaPorratas allergic to ivp dye [iodinated contrast media].  ? ?HPI  ?Today, she is being contacted for a post-procedure assessment. ? ? ?Post-procedure evaluation  ? Type: Lumbar Facet, Medial Branch Block(s) #2  ?Laterality: Bilateral  ?Level: L3, L4, L5, Medial Branch Level(s). Injecting these levels blocks the L3-4 and L5-S1 lumbar facet joints. ?Imaging: Fluoroscopic guidance ?Anesthesia: Local anesthesia (1-2%  Lidocaine) ?Anxiolysis: None                 ?Sedation: None. ?DOS: 06/24/2021 ?Performed by: BiGillis SantaMD ? ?  Primary Purpose: Diagnostic/Therapeutic ?Indications: Low back pain severe enough to impact quality of life or function. ?1. 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 Chronic pain syndrome   ? ?NAS-11 Pain score:  ? Pre-procedure: 6 /10  ? Post-procedure: 6 /10  ? ?   ?Effectiveness:  ?Initial hour after procedure: 10 %  ?Subsequent 4-6 hours post-procedure: 10 %  ?Analgesia past initial 6 hours:10% ?Ongoing improvement:  ?Analgesic:  10% ?Function: Back to baseline ?ROM: Back to baseline ? ? ? ? ?Laboratory Chemistry Profile  ? ?Renal ?Lab Results  ?Component Value Date  ? BUN 29 (H) 06/26/2021  ? CREATININE 0.79 06/26/2021  ? GFRAA >60 02/13/2018  ? GFRNONAA >60 06/26/2021  ?  Hepatic ?Lab Results  ?Component Value Date  ? AST 25 06/26/2021  ? ALT 23 06/26/2021  ? ALBUMIN 4.6 06/26/2021  ? ALKPHOS 42 06/26/2021  ? LIPASE 32 06/26/2021  ?  ?Electrolytes ?Lab Results  ?Component Value Date  ? NA 138 06/26/2021  ? K 3.7 06/26/2021  ? CL 94 (L) 06/26/2021  ? CALCIUM 9.5 06/26/2021  ? MG 2.7 (H) 02/07/2018  ? PHOS 3.2 02/07/2018  ?  Bone ?No results found for: [place], [id number], [id number], [id number], 25OHVITD1, 25OHVITD2, 25OHVITD3, TESTOFREE, TESTOSTERONE  ?Inflammation (CRP: Acute Phase) (ESR: Chronic Phase) ?Lab Results  ?Component Value Date  ? ESRSEDRATE 48 (H) 05/25/2011  ?    ?  ? ?Note: Above Lab results reviewed. ? ?Imaging  ?CT ABDOMEN PELVIS WO CONTRAST ?CLINICAL DATA:  Small-bowel obstruction, nausea, vomiting, lower ?abdominal pain. ? ?EXAM: ?CT ABDOMEN AND PELVIS WITHOUT CONTRAST ? ?TECHNIQUE: ?Multidetector CT imaging of the abdomen and pelvis was performed ?following the standard protocol without IV contrast. ? ?RADIATION DOSE REDUCTION: This exam was performed according to the ?departmental dose-optimization program which includes  automated ?exposure control, adjustment of the mA and/or kV according to ?patient size and/or use of iterative reconstruction technique. ? ?COMPARISON:  10/07/2020 ? ?FINDINGS: ?Lower chest: Moderate coronary artery calcification. Small ?pericardial effusion, unchanged. ? ?Hepatobiliary: No focal liver abnormality is seen. No gallstones, ?gallbladder wall thickening, or biliary dilatation. ? ?Pancreas: Unremarkable ? ?Spleen: Unremarkable ? ?Adrenals/Urinary Tract: The adrenal glands are unremarkable. The ?kidneys are normal in size and position. And exophytic 14 mm lesion ?is seen arising from the interpolar region of the left kidney, ?unchanged from prior examination, and better assessed on prior ?sonogram of 09/11/2018 and compatible with a hyperdense cyst. There ?are multiple nonobstructing calculi within the kidneys bilaterally ?measuring up to 5 mm within the lower pole of the left kidney and 3 ?mm within the interpolar region of the right kidney. Trace right ?hydronephrosis appears improved. No ureteral calculi. The bladder ?is unremarkable. ? ?Stomach/Bowel: Mild ascites is present, new since prior examination. ?The stomach, small bowel, and large bowel are unremarkable. No ?evidence of obstruction or focal inflammation. Appendix normal. No ?free intraperitoneal gas. ? ?Vascular/Lymphatic: Extensive aortoiliac atherosclerotic ?calcification. No aortic aneurysm. No pathologic adenopathy within ?the abdomen and pelvis. ? ?Reproductive: Status post hysterectomy. No adnexal masses. ? ?Other: No abdominal wall hernia ? ?Musculoskeletal: No acute bone abnormality. Moderate lumbar ?levoscoliosis. Osseous structures are otherwise age-appropriate. No ?lytic or blastic bone lesion. ? ?IMPRESSION: ?No evidence of obstruction. Interval development of mild ?hydronephrosis, nonspecific. ? ?Moderate coronary artery calcification. ? ?Mild bilateral nonobstructing nephrolithiasis. Improved trace right ?hydronephrosis. No  urolithiasis. ? ?Stable 14 mm hyperdense cyst involving the interpolar region of the ?left kidney. ? ?Aortic Atherosclerosis (ICD10-I70.0). ? ?  Electronically Signed ?  By: Fidela Salisbury M.D. ?  On: 06/27/2021 03:

## 2021-07-22 ENCOUNTER — Other Ambulatory Visit: Payer: Self-pay | Admitting: Internal Medicine

## 2021-07-22 DIAGNOSIS — Z1231 Encounter for screening mammogram for malignant neoplasm of breast: Secondary | ICD-10-CM

## 2021-08-18 ENCOUNTER — Ambulatory Visit
Payer: Medicare PPO | Attending: Student in an Organized Health Care Education/Training Program | Admitting: Student in an Organized Health Care Education/Training Program

## 2021-08-18 ENCOUNTER — Encounter: Payer: Self-pay | Admitting: Student in an Organized Health Care Education/Training Program

## 2021-08-18 VITALS — BP 154/64 | HR 74 | Temp 98.1°F | Resp 16 | Ht 67.0 in | Wt 116.0 lb

## 2021-08-18 DIAGNOSIS — M792 Neuralgia and neuritis, unspecified: Secondary | ICD-10-CM | POA: Diagnosis present

## 2021-08-18 DIAGNOSIS — B0229 Other postherpetic nervous system involvement: Secondary | ICD-10-CM | POA: Insufficient documentation

## 2021-08-18 DIAGNOSIS — M47816 Spondylosis without myelopathy or radiculopathy, lumbar region: Secondary | ICD-10-CM | POA: Insufficient documentation

## 2021-08-18 DIAGNOSIS — G894 Chronic pain syndrome: Secondary | ICD-10-CM | POA: Diagnosis present

## 2021-08-18 DIAGNOSIS — M47894 Other spondylosis, thoracic region: Secondary | ICD-10-CM | POA: Diagnosis present

## 2021-08-18 MED ORDER — HYDROCODONE-ACETAMINOPHEN 10-325 MG PO TABS: 1.0000 | ORAL_TABLET | Freq: Two times a day (BID) | ORAL | 0 refills | Status: AC | PRN

## 2021-08-18 MED ORDER — HYDROCODONE-ACETAMINOPHEN 10-325 MG PO TABS: 1.0000 | ORAL_TABLET | Freq: Two times a day (BID) | ORAL | 0 refills | Status: DC | PRN

## 2021-08-18 NOTE — Progress Notes (Signed)
PROVIDER NOTE: Information contained herein reflects review and annotations entered in association with encounter. Interpretation of such information and data should be left to medically-trained personnel. Information provided to patient can be located elsewhere in the medical record under "Patient Instructions". Document created using STT-dictation technology, any transcriptional errors that may result from process are unintentional.  ?  ?Patient: Autumn [name]  Service Category: E/M  Provider: Gillis Santa, MD  ?DOB: 1945-05-21  DOS: 08/18/2021  Specialty: Interventional Pain Management  ?MRN: [id number]  Setting: Ambulatory outpatient  PCP: Autumn Lighter, MD  ?Type: Established Patient    Referring Provider: Baxter Hire, MD  ?Location: Office  Delivery: Face-to-face    ? ?HPI  ?[name], a 76 y.o. year old female, is here today because of her Spondylosis without myelopathy or radiculopathy, lumbar region [M47.816]. [name] primary complain today is Back Pain (Left side worse) ?Last encounter: My last encounter with her was on 07/14/21 ? ?Pertinent problems: [name] has COPD (chronic obstructive pulmonary disease) ([place]); Post herpetic neuralgia; Pulmonary sarcoidosis ([place]); Chronic pain syndrome; Thoracic radiculopathy; and Spondylosis without myelopathy or radiculopathy, lumbar region on their pertinent problem list. ?Severity of Chronic pain is reported as a 8 /10. Location: Back Mid, Left/Radaites from mid left back acrodd rib cage to under left breast area. Onset: More than a month ago. Quality: Constant, Burning, Shooting, Sharp. Timing: Constant. Modifying factor(s): Hydrocodone. ?Vitals:  height is '5\' 7"'$  (1.702 m) and weight is 116 lb (52.6 kg). Her temporal temperature is 98.1 ?F (36.7 ?C). Her blood pressure is 154/64 (abnormal) and her pulse is 74. Her respiration is 16 and oxygen saturation is 100%.  ? ?Reason for encounter: medication management.   ? ?Patient is having difficulty  managing her low back pain.  She takes hydrocodone in the morning and another hydrocodone in the evening.  She states that she has more back pain late in the afternoon when she is attempting to get dinner ready.  She states that she would benefit from having an afternoon hydrocodone even half a tablet that she could take on an as-needed basis. ? ?We also had a discussion about lumbar radiofrequency ablation as the patient is status post 2 diagnostic lumbar facet medial branch nerve blocks with some benefit.  She states that she will think about this further and be more inclined to move forward with this if adjustments to her medications are not helpful. ? ? ?Pharmacotherapy Assessment  ?Analgesic Hydrocodone 10 mg BID PRN ? ?Monitoring: ?Autumn [name] PMP: PDMP reviewed during this encounter.       ?Pharmacotherapy: No side-effects or adverse reactions reported. ?Compliance: No problems identified. ?Effectiveness: Clinically acceptable. ? ?Autumn Patience, RN  08/18/2021  2:21 PM  Sign when Signing Visit ?Nursing Pain Medication Assessment:  ?Safety precautions to be maintained throughout the outpatient stay will include: orient to surroundings, keep bed in low position, maintain call bell within reach at all times, provide assistance with transfer out of bed and ambulation.  ?Medication Inspection Compliance: Pill count conducted under aseptic conditions, in front of the patient. Neither the pills nor the bottle was removed from the patient's sight at any time. Once count was completed pills were immediately returned to the patient in their original bottle. ? ?Medication: Hydrocodone/APAP ?Pill/Patch Count:  13 of 60 pills remain ?Pill/Patch Appearance: Markings consistent with prescribed medication ?Bottle Appearance: Standard pharmacy container. Clearly labeled. ?Filled Date: 04 / 15 / 2023 ?Last Medication intake:  Today ?  UDS:  ?Summary  ?  Date Value Ref Range Status  ?05/21/2021 Note  Final  ?  Comment:  ?   ==================================================================== ?Tox[facility] Select 13 (MW) ?==================================================================== ?Test                             Result       Flag       Units ? ?Drug Present and Declared for Prescription Verification ?  Hydrocodone                    777          EXPECTED   ng/mg creat ?  Hydromorphone                  198          EXPECTED   ng/mg creat ?  Dihydrocodeine                 245          EXPECTED   ng/mg creat ?  Norhydrocodone                 3048         EXPECTED   ng/mg creat ?   Sources of hydrocodone include scheduled prescription medications. ?   Hydromorphone, dihydrocodeine and norhydrocodone are expected ?   metabolites of hydrocodone. Hydromorphone and dihydrocodeine are ?   also available as scheduled prescription medications. ? ?==================================================================== ?Test                      Result    Flag   Units      Ref Range ?  Creatinine              91               mg/dL      >=20 ?==================================================================== ?Declared Medications: ? The flagging and interpretation on this report are based on the ? following declared medications.  Unexpected results may arise from ? inaccuracies in the declared medications. ? ? **Note: The testing scope of this panel includes these medications: ? ? Hydrocodone (Norco) ? ? **Note: The testing scope of this panel does not include the ? following reported medications: ? ? Acetaminophen (Tylenol) ? Acetaminophen (Norco) ? Albuterol (Ventolin HFA) ? Albuterol (Duoneb) ? Amlodipine (Norvasc) ? Atenolol (Tenormin) ? Atorvastatin (Lipitor) ? Denosumab (Prolia) ? Eye Drop ? Fluticasone (Flonase) ? Fluticasone (Trelegy) ? Ipratropium (Duoneb) ? Losartan (Cozaar) ? Omeprazole (Prilosec) ? Polyethylene Glycol (MiraLAX) ? Prednisone ? Sertraline (Zoloft) ? Tizanidine (Zanaflex) ? Umeclidinium (Trelegy) ? Vilanterol  (Trelegy) ?==================================================================== ?For clinical consultation, please call [phone]. ?==================================================================== ?  ?  ? ?ROS  ?Constitutional: Denies any fever or chills ?Gastrointestinal: No reported hemesis, hematochezia, vomiting, or acute GI distress ?Musculoskeletal: LBP ?Neurological: No reported episodes of acute onset apraxia, aphasia, dysarthria, agnosia, amnesia, paralysis, loss of coordination, or loss of consciousness ? ?Medication Review  ?Fluticasone-Umeclidin-Vilant, HYDROcodone-acetaminophen, Propylene Glycol, acetaminophen, albuterol, amLODipine, atenolol, atorvastatin, denosumab, fluticasone, ipratropium-albuterol, losartan, omeprazole, ondansetron, polyethylene glycol powder, predniSONE, and sertraline ? ?History Review  ?Allergy: Ms. Heater is allergic to ivp dye [iodinated contrast media]. ?Drug: Ms. Mccluskey  reports no history of drug use. ?Alcohol:  reports no history of alcohol use. ?Tobacco:  reports that she has never smoked. She has never used smokeless tobacco. ?Social: Ms. Sutherlin  reports that she has never smoked. She has never used smokeless tobacco. She  reports that she does not drink alcohol and does not use drugs. ?Medical:  has a past medical history of Allergy, Anemia, Arthritis, COPD (chronic obstructive pulmonary disease) ([place]), Glaucoma, H/O uvulectomy, Hypertension, Osteoporosis, Ovarian cancer ([place]), Postherpetic neuralgia, Pulmonary sarcoidosis ([place]), and Sarcoid. ?Surgical: Ms. Shackleton  has a past surgical history that includes Abdominal hysterectomy; Dilation and curettage of uterus; Vulvar lesion removal; Diagnostic laparoscopy; Eye surgery; Colonoscopy with propofol (N/A, 07/30/2019); and Breast excisional biopsy (Right). ?Family: family history includes Breast cancer (age of onset: 72) in her mother; Cancer in her mother; Hypertension in her father. ? ?Laboratory Chemistry Profile   ? ?Renal ?Lab Results  ?Component Value Date  ? BUN 29 (H) 06/26/2021  ? CREATININE 0.79 06/26/2021  ? GFRAA >60 02/13/2018  ? GFRNONAA >60 06/26/2021  ?  Hepatic ?Lab Results  ?Component Value Date  ? AST 25 06/26/2021

## 2021-08-18 NOTE — Progress Notes (Signed)
Nursing Pain Medication Assessment:  ?Safety precautions to be maintained throughout the outpatient stay will include: orient to surroundings, keep bed in low position, maintain call bell within reach at all times, provide assistance with transfer out of bed and ambulation.  ?Medication Inspection Compliance: Pill count conducted under aseptic conditions, in front of the patient. Neither the pills nor the bottle was removed from the patient's sight at any time. Once count was completed pills were immediately returned to the patient in their original bottle. ? ?Medication: Hydrocodone/APAP ?Pill/Patch Count:  13 of 60 pills remain ?Pill/Patch Appearance: Markings consistent with prescribed medication ?Bottle Appearance: Standard pharmacy container. Clearly labeled. ?Filled Date: 04 / 15 / 2023 ?Last Medication intake:  Today ?

## 2021-08-27 IMAGING — MG DIGITAL SCREENING BILAT W/ TOMO W/ CAD
8 series · 9 of 24 positions shown · non-contrast
Comparison: Previous exam(s).

CLINICAL DATA: Screening.

EXAM:
DIGITAL SCREENING BILATERAL MAMMOGRAM WITH TOMO AND CAD

[L CC synth-2D]
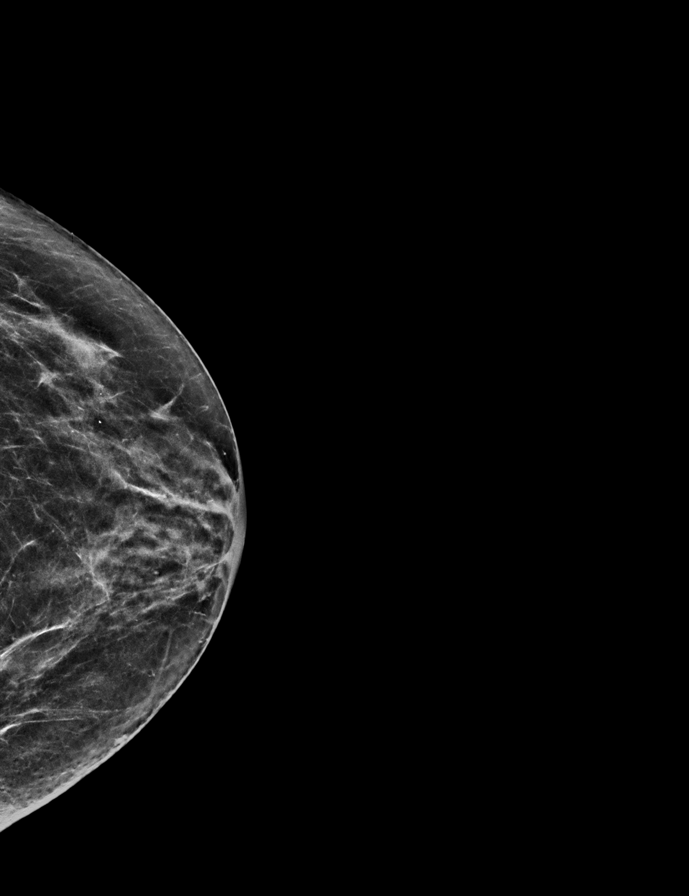

[R MLO synth-2D]
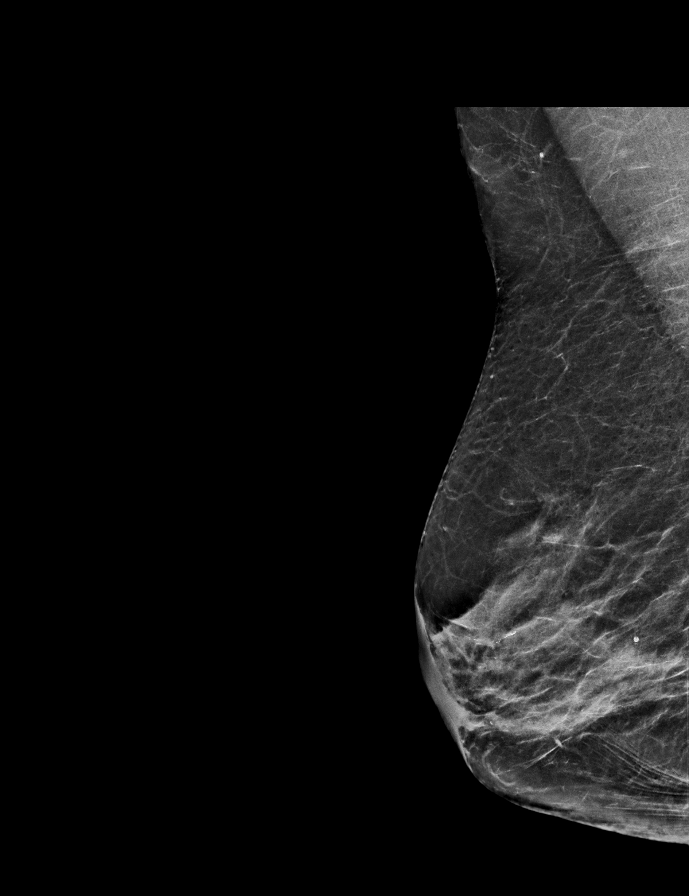

[R CC synth-2D]
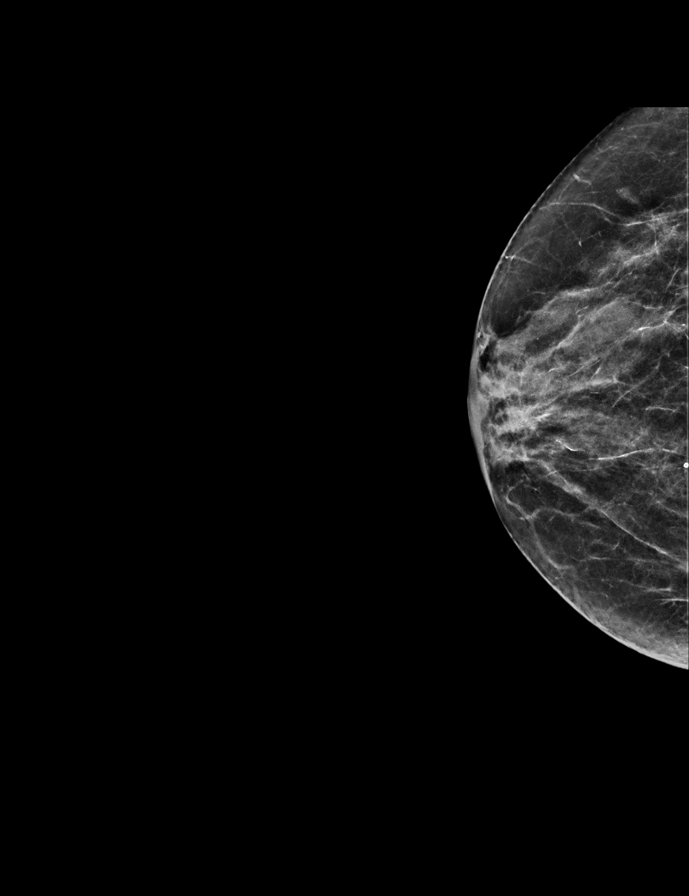

[L MLO synth-2D]
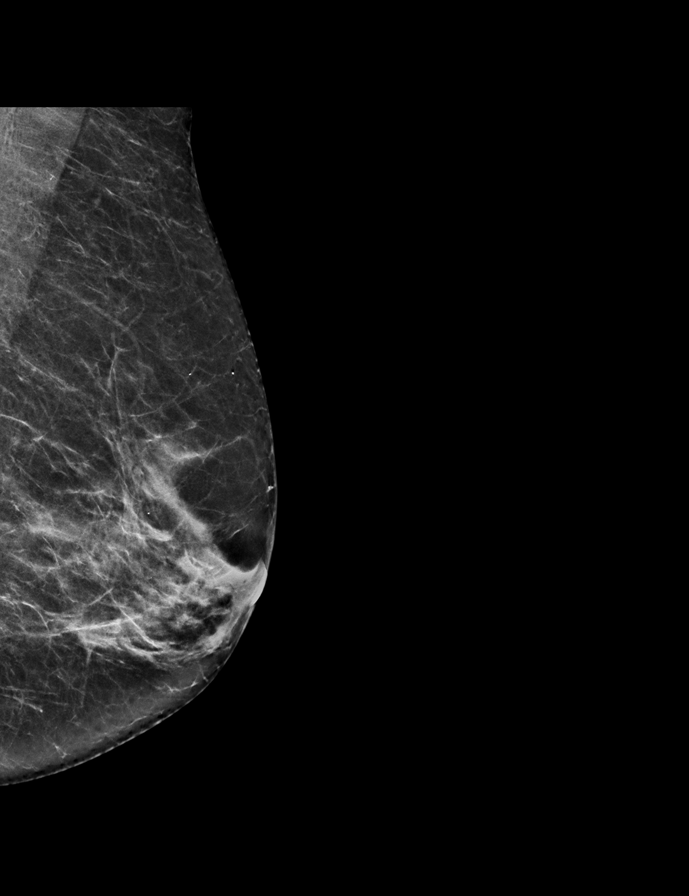

[R CC tomo · 2 of 48 frames shown]
[frame 16/48]
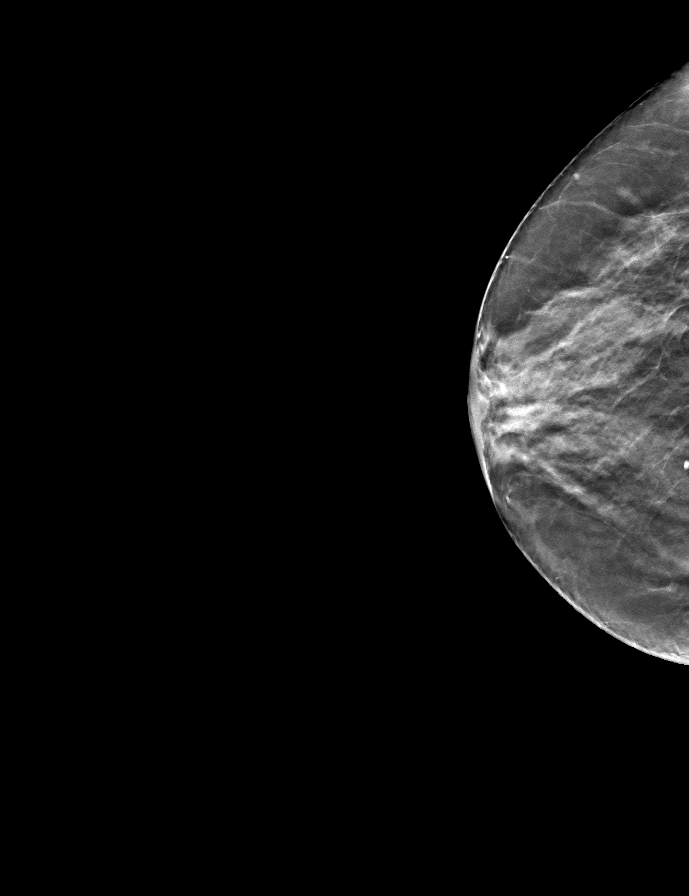
[frame 25/48]
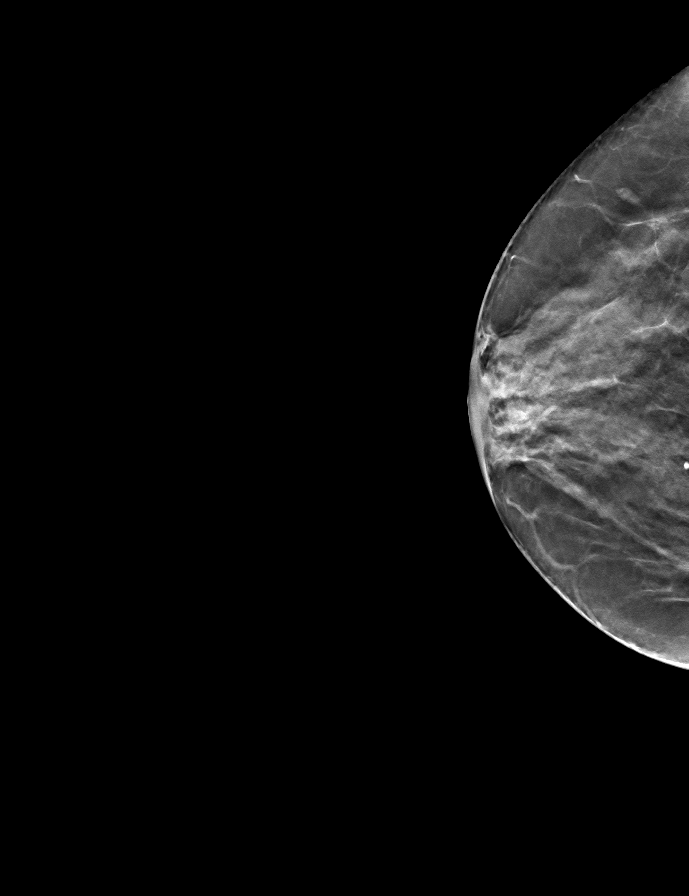

[L MLO tomo · tomo slice 32/63.0]
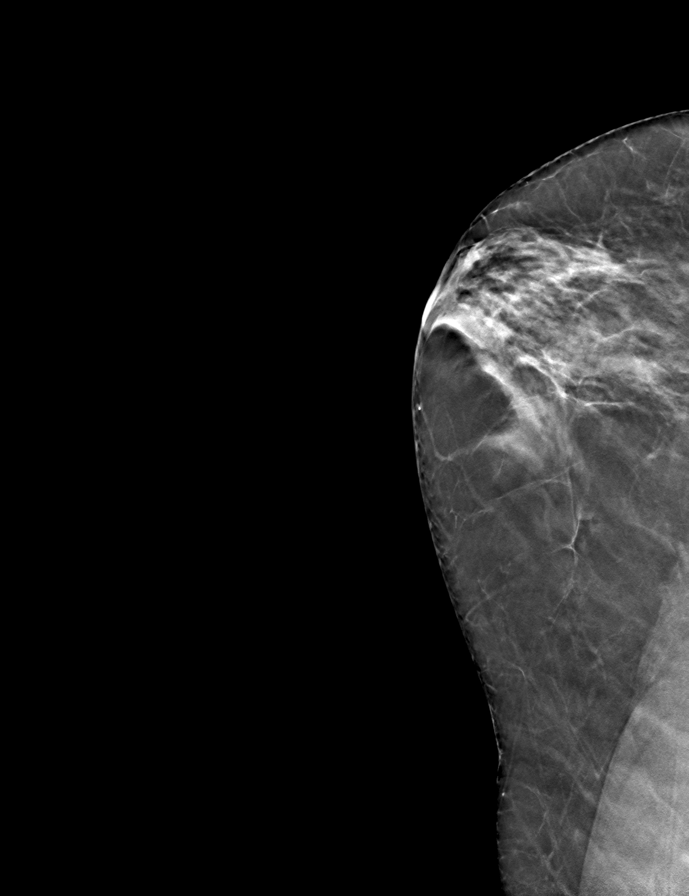

[R MLO tomo · tomo slice 31/61.0]
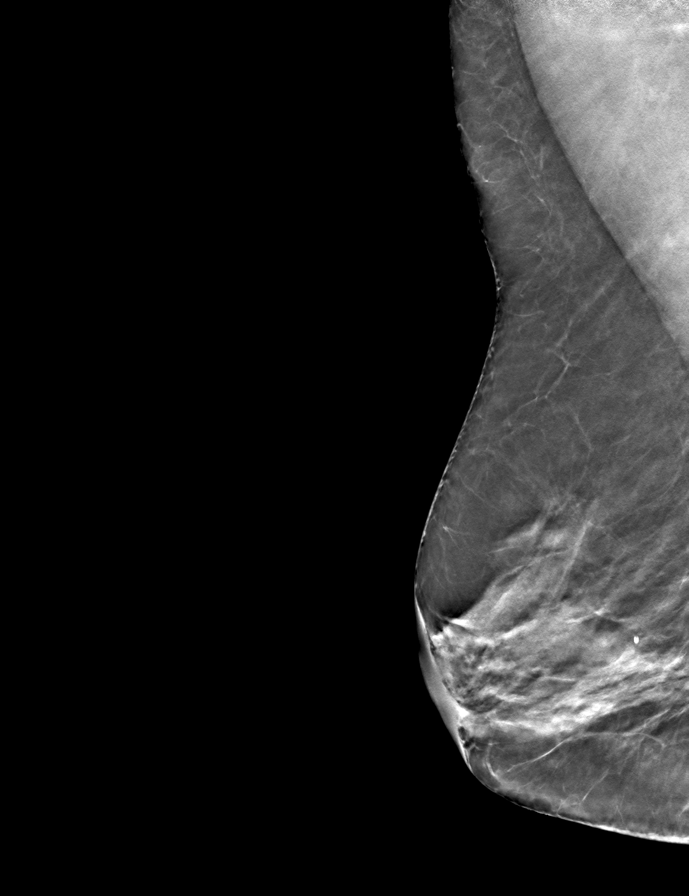

[L CC tomo · tomo slice 27/52.0]
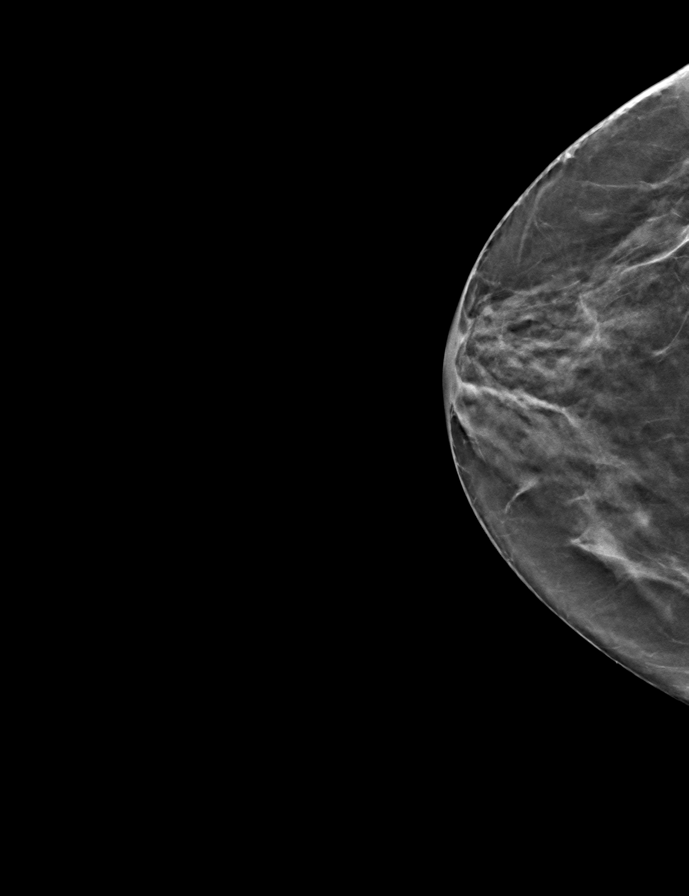

[9 of 24 positions shown; findings below may reference images not displayed]

ACR Breast Density Category c: The breast tissue is heterogeneously
dense, which may obscure small masses.
FINDINGS: There are no findings suspicious for malignancy. Images were
processed with CAD.
IMPRESSION: No mammographic evidence of malignancy. A result letter of this
screening mammogram will be mailed directly to the patient.

RECOMMENDATION:
Screening mammogram in one year. (Code:[id number])

BI-RADS CATEGORY  1: Negative.

## 2021-08-28 ENCOUNTER — Ambulatory Visit
Admission: RE | Admit: 2021-08-28 | Discharge: 2021-08-28 | Disposition: A | Discharge status: Home / Self Care | Payer: Medicare PPO | Source: Ambulatory Visit | Attending: Internal Medicine | Admitting: Internal Medicine

## 2021-08-28 DIAGNOSIS — Z1231 Encounter for screening mammogram for malignant neoplasm of breast: Secondary | ICD-10-CM | POA: Diagnosis present

## 2021-09-24 ENCOUNTER — Other Ambulatory Visit: Payer: Self-pay

## 2021-09-24 ENCOUNTER — Telehealth: Payer: Self-pay | Admitting: Student in an Organized Health Care Education/Training Program

## 2021-09-24 DIAGNOSIS — B0229 Other postherpetic nervous system involvement: Secondary | ICD-10-CM

## 2021-09-24 DIAGNOSIS — G894 Chronic pain syndrome: Secondary | ICD-10-CM

## 2021-09-24 NOTE — Telephone Encounter (Signed)
[facility] cancelled and sent to [facility].  Refill request to Dr Dossie Arbour

## 2021-09-24 NOTE — Telephone Encounter (Signed)
[facility] in [place] has her meds, can you call in there

## 2021-09-24 NOTE — Telephone Encounter (Signed)
[facility]. I explained she needs to call pharmacies and find one that has her meds and call us back. She said ok

## 2021-09-25 MED ORDER — HYDROCODONE-ACETAMINOPHEN 10-325 MG PO TABS: 1.0000 | ORAL_TABLET | Freq: Two times a day (BID) | ORAL | 0 refills | Status: AC | PRN

## 2021-10-12 ENCOUNTER — Other Ambulatory Visit: Payer: Self-pay | Admitting: Internal Medicine

## 2021-10-12 DIAGNOSIS — D860 Sarcoidosis of lung: Secondary | ICD-10-CM

## 2021-10-12 DIAGNOSIS — J9611 Chronic respiratory failure with hypoxia: Secondary | ICD-10-CM

## 2021-10-12 DIAGNOSIS — J441 Chronic obstructive pulmonary disease with (acute) exacerbation: Secondary | ICD-10-CM

## 2021-10-12 DIAGNOSIS — Z0000 Encounter for general adult medical examination without abnormal findings: Secondary | ICD-10-CM

## 2021-10-15 ENCOUNTER — Ambulatory Visit (INDEPENDENT_AMBULATORY_CARE_PROVIDER_SITE_OTHER): Payer: Medicare PPO | Admitting: Pulmonary Disease

## 2021-10-15 ENCOUNTER — Encounter: Payer: Self-pay | Admitting: Pulmonary Disease

## 2021-10-15 VITALS — BP 148/82 | HR 68 | Temp 97.8°F | Ht 67.0 in | Wt 105.2 lb

## 2021-10-15 DIAGNOSIS — J9611 Chronic respiratory failure with hypoxia: Secondary | ICD-10-CM

## 2021-10-15 DIAGNOSIS — D860 Sarcoidosis of lung: Secondary | ICD-10-CM

## 2021-10-15 DIAGNOSIS — M419 Scoliosis, unspecified: Secondary | ICD-10-CM

## 2021-10-15 MED ORDER — TRELEGY ELLIPTA 200-62.5-25 MCG/ACT IN AEPB
1.0000 | INHALATION_SPRAY | Freq: Every day | RESPIRATORY_TRACT | 0 refills | Status: DC
Start: 2021-10-15 — End: 2021-11-12

## 2021-10-15 NOTE — Patient Instructions (Addendum)
We will see you in follow-up in 2 to 3 months time.  We have given you samples of the stronger Trelegy which is 1 puff daily.  Make sure you rinse your mouth well after you use it.  Let us know if this works better for you so that we can send a prescription into your pharmacy.  You have enough for 1 month on the samples provided.  We will schedule you for breathing test before you come back to see [place].

## 2021-10-15 NOTE — Progress Notes (Signed)
Subjective:    Patient ID: Autumn [name], female    DOB: 04/12/1946, 76 y.o.   MRN: [id number] Patient Care Team: Gladstone Lighter, MD as PCP - General (Internal Medicine)  Chief Complaint  Patient presents with   pulmonary consult    Sob with exertion, occ wheezing and dry cough.    HPI Autumn [name] is a 76 year old female, remote former smoker with minimal smoking history, who presents for evaluation of dyspnea on exertion wheezing and dry cough.  She is kindly referred by Dr. Gladstone Lighter as the patient has requested a second opinion.  Patient has been following with Dr. Wallene Huh for the issue of sarcoidosis, and presumed COPD.  The patient is currently on Trelegy Ellipta with some relief of her dyspnea but not optimal.  She is also on oxygen at 2 L/min due to chronic respiratory failure with hypoxia.  She has not had any fevers, chills or sweats of late.  She has not had any chest pain.  She has a dry cough mostly in the mornings.  No lower extremity edema.  She noted that over the last 3 weeks she had had increased problems with wheezing however this has now abated.  As noted she has been following with Dr. Wallene Huh and has also followed with Dr. Bartholome Bill for her cardiac issues.  Most recent echocardiogram was performed on 01 October 2020 showed an LVEF of 50 to 55%, moderate AR, moderate MR and moderate TR.  Previously CT chest showed significant bilateral upper lobe predominant scarring, left apical cystic changes with traction bronchiectasis and scattered nodularity.  Consistent with sarcoidosis  Patient does not have significant occupational exposure.  She used to teach elementary school in the past.  I have reviewed all of her imaging and available data.   DATA: Most recent pulmonary function data obtained from [facility] done on 24 Aug 2021 are as follows: SPIROMETRY: FVC was 0.68 Liters, 26% of predicted FEV1 was 0.53 Liters, 27% of predicted FEV1  ratio was 78.26%, 101% of predicted FEF 25-75% Liters per second was 0.44, 25% of predicted  DIFFUSION CAPACITY: DLCO was 7.14 mL/min/mmHg VA was 2.90 Liters DLCO/VA was 2.49 mL/min/mmHg/L  LUNG VOLUMES: TLC was 3.43 Liters RV was 2.75 Liters  FLOW VOLUME LOOP: Looks more restrictive  Impression Spirometry is c/w more so of restrictive TLC is mildly decreased DLCO is severely decreased, dlco/va is moderately decreased   Study interpreted by Dr. Wallene Huh.  Review of Systems A 10 point review of systems was performed and it is as noted above otherwise negative.  Past Medical History:  Diagnosis Date   Allergy    Anemia    Arthritis    COPD (chronic obstructive pulmonary disease) (HCC)    Glaucoma    H/O uvulectomy    Hypertension    Osteoporosis    Ovarian cancer ([place])    Postherpetic neuralgia    Pulmonary sarcoidosis (HCC)    Sarcoid    Past Surgical History:  Procedure Laterality Date   ABDOMINAL HYSTERECTOMY     BREAST EXCISIONAL BIOPSY Right    benign   COLONOSCOPY WITH PROPOFOL N/A 07/30/2019   Procedure: COLONOSCOPY WITH PROPOFOL;  Surgeon: Toledo, Benay Pike, MD;  Location: [facility] ENDOSCOPY;  Service: Gastroenterology;  Laterality: N/A;   DIAGNOSTIC LAPAROSCOPY     DILATION AND CURETTAGE OF UTERUS     EYE SURGERY     VULVAR LESION REMOVAL     Patient Active Problem List  Diagnosis Date Noted   Hyperlipidemia 05/05/2020   Hypertension 05/05/2020   OP (osteoporosis) 05/05/2020   Pain due to onychomycosis of toenails of both feet 05/05/2020   Spondylosis without myelopathy or radiculopathy, lumbar region 11/06/2019   Neuropathic pain 12/14/2018   Chronic use of opiate for therapeutic purpose 12/14/2018   Chronic pain syndrome 12/14/2018   Thoracic radiculopathy 12/14/2018   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   Depression, prolonged 04/07/2018   Acute on chronic respiratory failure with hypoxia [facility])    Palliative care encounter    Protein-calorie  malnutrition, severe 02/06/2018   Acute respiratory failure ([place]) 02/04/2018   COPD (chronic obstructive pulmonary disease) ([place]) 03/22/2017   Post herpetic neuralgia 07/30/2016   Hypercalcemia 02/17/2016   Chronic angle-closure glaucoma of both eyes, severe stage 01/20/2016   Age-related nuclear cataract of both eyes 12/17/2015   Pneumonia 11/17/2015   Long term current use of systemic steroids 02/14/2015   Pulmonary hypertension ([place]) 10/05/2013   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 1 pack lasted >2 weeks.  Substance Use Topics   Alcohol use: No   Allergies  Allergen Reactions   Ivp Dye [Iodinated Contrast Media] Hives and Itching   Current Meds  Medication Sig   acetaminophen (TYLENOL) 500 MG tablet Take 500 mg by mouth every 4 (four) hours as needed for mild pain or moderate pain.   albuterol (VENTOLIN HFA) 108 (90 Base) MCG/ACT inhaler INHALE 2 PUFFS BY MOUTH EVERY 6 HOURS AS NEEDED FOR WHEEZE   amLODipine (NORVASC) 5 MG tablet Take 5 mg by mouth daily.   atenolol (TENORMIN) 50 MG tablet Take 50 mg by mouth 2 (two) times daily.    atorvastatin (LIPITOR) 10 MG tablet Take 1 tablet by mouth daily.    denosumab (PROLIA) 60 MG/ML SOLN injection Inject 60 mg into the skin every 6 (six) months.    fluticasone (FLONASE) 50 MCG/ACT nasal spray Place 2 sprays into both nostrils daily as needed.    Fluticasone-Umeclidin-Vilant (TRELEGY ELLIPTA) 200-62.5-25 MCG/ACT AEPB Inhale 1 puff into  the lungs daily.   [START ON 10/22/2021] HYDROcodone-acetaminophen (NORCO) 10-325 MG tablet Take 1 tablet by mouth every 12 (twelve) hours as needed for severe pain. Must last 30 days.   HYDROcodone-acetaminophen (NORCO) 10-325 MG tablet Take 1 tablet by mouth every 12 (twelve) hours as needed for severe pain. Must last 30 days.   ipratropium-albuterol (DUONEB) 0.5-2.5 (3) MG/3ML SOLN Take 3 mLs by nebulization every 6 (six) hours as needed.   losartan (COZAAR) 50 MG tablet Take 1 tablet by mouth daily.   omeprazole (PRILOSEC) 20 MG capsule Take 1 tablet by mouth daily.   ondansetron (ZOFRAN-ODT) 4 MG disintegrating tablet Take 1 tablet (4 mg total) by mouth every 8 (eight) hours as needed.   polyethylene glycol powder (GLYCOLAX/MIRALAX) 17 GM/SCOOP powder Take by mouth.   predniSONE (DELTASONE) 10 MG tablet Take 1 tablet (10 mg total) by mouth daily. Start after finishing  the prednisone taper (Patient taking differently: Take 5 mg by mouth daily. Start after finishing the prednisone taper)   Propylene Glycol 0.6 % SOLN Place 1 drop into both eyes daily as needed (dryness).   sertraline (ZOLOFT) 25 MG tablet Take 25 mg by mouth daily.   Immunization History  Administered Date(s) Administered   Fluad Quad(high Dose 65+) 12/22/2020   Influenza Split 12/17/2016   Influenza, High Dose Seasonal PF 01/03/2017, 12/16/2017, 12/22/2018   Influenza-Unspecified 01/28/2011, 01/10/2012, 03/12/2013, 02/09/2014, 02/04/2015, 01/25/2016, 12/20/2019   PFIZER(Purple Top)SARS-COV-2 Vaccination 05/15/2019, 06/12/2019   Pneumococcal Conjugate-13 02/17/2016   Pneumococcal Polysaccharide-23 05/03/2011   Zoster Recombinat (Shingrix) 10/14/2017, 12/16/2017       Objective:   Physical Exam BP (!) 148/82 (BP Location: Left Arm, Cuff Size: Normal)   Pulse 68   Temp 97.8 F (36.6 C) (Temporal)   Ht '5\' 7"'$  (1.702 m)   Wt 105 lb 3.2 oz (47.7 kg)   SpO2 100%   BMI 16.48 kg/m   SpO2: 100 % O2 Device: Nasal  cannula O2 Flow Rate (L/min): 2 L/min O2 Type: Pulse O2  GENERAL: Chronically ill-appearing woman, presents ambulating with help of a walker.  On supplemental oxygen at 2 L/min. HEAD: Normocephalic, atraumatic.  EYES: Pupils equal, round, reactive to light.  No scleral icterus.  MOUTH: Poor dentition, oral mucosa moist.  No thrush. NECK: Supple. No thyromegaly. Trachea midline.  There is mild JVD.  No adenopathy. PULMONARY: Good air entry bilaterally.  Coarse breath sounds throughout, no wheezes or rhonchi. CARDIOVASCULAR: S1 and S2. Regular rate and rhythm.  Loud mitral regurgitation murmur. ABDOMEN: Scaphoid, otherwise benign. MUSCULOSKELETAL: Significant dextro scoliosis, no clubbing, no edema.  NEUROLOGIC: No overt focal deficit. SKIN: Intact,warm,dry. PSYCH: Mood and behavior normal.     Assessment & Plan:     ICD-10-CM   1. Pulmonary sarcoidosis (HCC)  D86.0 Pulmonary Function Test [facility] Only   She appears to have severe sequela of sarcoidosis This does not appear to be active Reassess with PFTs    2. Chronic respiratory failure with hypoxia (HCC)  J96.11    Patient compliant with oxygen at 2 L/min    3. 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    This issue adds complexity to her management May add to restrictive physiology     Orders Placed This Encounter  Procedures   Pulmonary Function Test [facility] Only    Standing Status:   Future    Number of Occurrences:   1    Standing Expiration Date:   10/16/2022    Order Specific Question:   Full PFT: includes the following: basic spirometry, spirometry pre & post bronchodilator, diffusion capacity (DLCO), lung volumes    Answer:   Full PFT   Meds ordered this encounter  Medications   Fluticasone-Umeclidin-Vilant (TRELEGY ELLIPTA) 200-62.5-25 MCG/ACT AEPB    Sig: Inhale 1 puff into the lungs daily.    Dispense:  14 each    Refill:  0    Order Specific Question:   Lot Number?    Answer:   [id number]    Order Specific Question:    Expiration Date?    Answer:   01/11/2023    Order Specific Question:   Manufacturer?    Answer:   [employer] [[id number]]    Order Specific Question:   Quantity    Answer:   2   Patient requested samples of Trelegy due to difficulty obtaining the medication.  We provided her with samples of Trelegy 200 she is to  let us know if this is more helpful to her than Trelegy 100.  We will see her in follow-up in 2 to 3 months time she is to contact us prior to that time should any new difficulties arise.  Renold Don, MD Advanced Bronchoscopy [facility]    *This note was dictated using voice recognition software/Dragon.  Despite best efforts to proofread, errors can occur which can change the meaning. Any transcriptional errors that result from this process are unintentional and may not be fully corrected at the time of dictation.

## 2021-10-21 ENCOUNTER — Other Ambulatory Visit: Payer: Self-pay | Admitting: Student in an Organized Health Care Education/Training Program

## 2021-10-21 DIAGNOSIS — M47816 Spondylosis without myelopathy or radiculopathy, lumbar region: Secondary | ICD-10-CM

## 2021-10-21 NOTE — Progress Notes (Signed)
Autumn [name] is status post 2 positive diagnostic lumbar facet medial nerve blocks bilaterally on 05/11/2021 and 06/24/2021 that provided her with 75 to 80% pain relief for at least 2 weeks with gradual return of pain thereafter.  She called today and states that she would like to move forward with lumbar radiofrequency ablation which we had discussed in May.  She states that it made her back pain was manageable but over the last 3 to 4 weeks, she has noticed increased lower back pain similar in intensity to before her previous lumbar facet medial branch nerve block.  Risk and benefits of lumbar RFA discussed and patient like to proceed.

## 2021-10-26 ENCOUNTER — Ambulatory Visit
Admission: RE | Admit: 2021-10-26 | Discharge: 2021-10-26 | Disposition: A | Discharge status: Home / Self Care | Payer: Medicare PPO | Source: Ambulatory Visit | Attending: Internal Medicine | Admitting: Internal Medicine

## 2021-10-26 DIAGNOSIS — Z0000 Encounter for general adult medical examination without abnormal findings: Secondary | ICD-10-CM

## 2021-10-26 DIAGNOSIS — J441 Chronic obstructive pulmonary disease with (acute) exacerbation: Secondary | ICD-10-CM

## 2021-10-26 DIAGNOSIS — J9611 Chronic respiratory failure with hypoxia: Secondary | ICD-10-CM

## 2021-10-26 DIAGNOSIS — D860 Sarcoidosis of lung: Secondary | ICD-10-CM

## 2021-11-04 ENCOUNTER — Telehealth: Payer: Self-pay

## 2021-11-04 NOTE — Telephone Encounter (Signed)
IN order to get auth for an RFA, Her insurance is requiring 80% pain relief from 2 facet blocks, and documented severe pain. One of her results are 10% and the other is 75%. They are going to deny the RFA because of this. They offered a p2p but only had after 6 pm today and tomorrow. I declined those. They said we could resubmit later if the criteria is met.

## 2021-11-10 ENCOUNTER — Encounter: Payer: Self-pay | Admitting: Student in an Organized Health Care Education/Training Program

## 2021-11-10 ENCOUNTER — Ambulatory Visit
Payer: Medicare PPO | Attending: Student in an Organized Health Care Education/Training Program | Admitting: Student in an Organized Health Care Education/Training Program

## 2021-11-10 ENCOUNTER — Telehealth: Payer: Self-pay

## 2021-11-10 VITALS — BP 182/80 | HR 81 | Temp 98.1°F | Ht 66.0 in | Wt 109.0 lb

## 2021-11-10 DIAGNOSIS — M47816 Spondylosis without myelopathy or radiculopathy, lumbar region: Secondary | ICD-10-CM | POA: Insufficient documentation

## 2021-11-10 DIAGNOSIS — G894 Chronic pain syndrome: Secondary | ICD-10-CM | POA: Diagnosis not present

## 2021-11-10 DIAGNOSIS — Z79891 Long term (current) use of opiate analgesic: Secondary | ICD-10-CM | POA: Diagnosis not present

## 2021-11-10 DIAGNOSIS — B0229 Other postherpetic nervous system involvement: Secondary | ICD-10-CM | POA: Insufficient documentation

## 2021-11-10 MED ORDER — HYDROCODONE-ACETAMINOPHEN 10-325 MG PO TABS: 1.0000 | ORAL_TABLET | Freq: Two times a day (BID) | ORAL | 0 refills | Status: AC | PRN

## 2021-11-10 MED ORDER — HYDROCODONE-ACETAMINOPHEN 10-325 MG PO TABS: 1.0000 | ORAL_TABLET | Freq: Two times a day (BID) | ORAL | 0 refills | Status: DC | PRN

## 2021-11-10 NOTE — Progress Notes (Signed)
PROVIDER NOTE: Information contained herein reflects review and annotations entered in association with encounter. Interpretation of such information and data should be left to medically-trained personnel. Information provided to patient can be located elsewhere in the medical record under "Patient Instructions". Document created using STT-dictation technology, any transcriptional errors that may result from process are unintentional.    Patient: Autumn [name]  Service Category: E/M  Provider: Gillis Santa, MD  DOB: 1945-06-15  DOS: 11/10/2021  Specialty: Interventional Pain Management  MRN: [id number]  Setting: Ambulatory outpatient  PCP: Gladstone Lighter, MD  Type: Established Patient    Referring Provider: Gladstone Lighter, MD  Location: Office  Delivery: Face-to-face     HPI  Ms. WILENE PHARO, a 76 y.o. year old female, is here today because of her Lumbar facet arthropathy [M47.816]. Ms. Yost primary complain today is Back Pain (low) Last encounter: My last encounter with her was on 08/18/21  Pertinent problems: Ms. Cantrall has COPD (chronic obstructive pulmonary disease) ([place]); Post herpetic neuralgia; Pulmonary sarcoidosis ([place]); Chronic pain syndrome; Thoracic radiculopathy; and Spondylosis without myelopathy or radiculopathy, lumbar region on their pertinent problem list. Severity of Chronic pain is reported as a 8 /10. Location: Back Mid, Left/Radaites from mid left back acrodd rib cage to under left breast area. Onset: More than a month ago. Quality: Constant, Burning, Shooting, Sharp. Timing: Constant. Modifying factor(s): Hydrocodone. Vitals:  height is _0  (1.676 m) and weight is 109 lb (49.4 kg). Her temperature is 98.1 F (36.7 C). Her blood pressure is 182/80 (abnormal) and her pulse is 81. Her oxygen saturation is 95%.   Reason for encounter: medication management.    Unfortunately, the patient's lumbar RFA was denied by insurance.  They stated that it was not a medical  necessity. In the interim, we will continue with medication management as below.  No change in her overall regimen. She has a bowel regimen for her if he does get constipated.  I encouraged her to take Dulcolax every day.  Pharmacotherapy Assessment  Analgesic Hydrocodone 10 mg BID PRN  Monitoring: [place] PMP: PDMP reviewed during this encounter.       Pharmacotherapy: No side-effects or adverse reactions reported. Compliance: No problems identified. Effectiveness: Clinically acceptable.  [name], RN  11/10/2021  2:31 PM  Sign when Signing Visit Nursing Pain Medication Assessment:  Safety precautions to be maintained throughout the outpatient stay will include: orient to surroundings, keep bed in low position, maintain call bell within reach at all times, provide assistance with transfer out of bed and ambulation.  Medication Inspection Compliance: Pill count conducted under aseptic conditions, in front of the patient. Neither the pills nor the bottle was removed from the patient's sight at any time. Once count was completed pills were immediately returned to the patient in their original bottle.  Medication: Hydrocodone/APAP Pill/Patch Count:  25 of 60 pills remain Pill/Patch Appearance: Markings consistent with prescribed medication Bottle Appearance: Standard pharmacy container. Clearly labeled. Filled Date: 07 / 13 / 2023 Last Medication intake:  Today     UDS:  Summary  Date Value Ref Range Status  05/21/2021 Note  Final    Comment:    ==================================================================== [employer] Select 13 (MW) ==================================================================== Test                             Result       Flag       Units  Drug Present and Declared  for Prescription Verification   Hydrocodone                    777          EXPECTED   ng/mg creat   Hydromorphone                  198          EXPECTED   ng/mg creat   Dihydrocodeine                  245          EXPECTED   ng/mg creat   Norhydrocodone                 3048         EXPECTED   ng/mg creat    Sources of hydrocodone include scheduled prescription medications.    Hydromorphone, dihydrocodeine and norhydrocodone are expected    metabolites of hydrocodone. Hydromorphone and dihydrocodeine are    also available as scheduled prescription medications.  ==================================================================== Test                      Result    Flag   Units      Ref Range   Creatinine              91               mg/dL      >=20 ==================================================================== Declared Medications:  The flagging and interpretation on this report are based on the  following declared medications.  Unexpected results may arise from  inaccuracies in the declared medications.   **Note: The testing scope of this panel includes these medications:   Hydrocodone (Norco)   **Note: The testing scope of this panel does not include the  following reported medications:   Acetaminophen (Tylenol)  Acetaminophen (Norco)  Albuterol (Ventolin HFA)  Albuterol (Duoneb)  Amlodipine (Norvasc)  Atenolol (Tenormin)  Atorvastatin (Lipitor)  Denosumab (Prolia)  Eye Drop  Fluticasone (Flonase)  Fluticasone (Trelegy)  Ipratropium (Duoneb)  Losartan (Cozaar)  Omeprazole (Prilosec)  Polyethylene Glycol (MiraLAX)  Prednisone  Sertraline (Zoloft)  Tizanidine (Zanaflex)  Umeclidinium (Trelegy)  Vilanterol (Trelegy) ==================================================================== For clinical consultation, please call [phone]. ====================================================================      ROS  Constitutional: Denies any fever or chills Gastrointestinal: No reported hemesis, hematochezia, vomiting, or acute GI distress Musculoskeletal: LBP Neurological: No reported episodes of acute onset apraxia, aphasia, dysarthria, agnosia,  amnesia, paralysis, loss of coordination, or loss of consciousness  Medication Review  Fluticasone-Umeclidin-Vilant, HYDROcodone-acetaminophen, Propylene Glycol, acetaminophen, albuterol, amLODipine, atenolol, atorvastatin, denosumab, fluticasone, ipratropium-albuterol, losartan, omeprazole, ondansetron, polyethylene glycol powder, predniSONE, and sertraline  History Review  Allergy: Ms. Advincula is allergic to ivp dye [iodinated contrast media]. Drug: Ms. Bacot  reports no history of drug use. Alcohol:  reports no history of alcohol use. Tobacco:  reports that she has quit smoking. Her smoking use included cigarettes. She has never used smokeless tobacco. Social: Ms. Oftedahl  reports that she has quit smoking. Her smoking use included cigarettes. She has never used smokeless tobacco. She reports that she does not drink alcohol and does not use drugs. Medical:  has a past medical history of Allergy, Anemia, Arthritis, COPD (chronic obstructive pulmonary disease) ([place]), Glaucoma, H/O uvulectomy, Hypertension, Osteoporosis, Ovarian cancer ([place]), Postherpetic neuralgia, Pulmonary sarcoidosis ([place]), and Sarcoid. Surgical: Ms. Noack  has a past surgical history that includes Abdominal hysterectomy; Dilation and curettage of uterus;  Vulvar lesion removal; Diagnostic laparoscopy; Eye surgery; Colonoscopy with propofol (N/A, 07/30/2019); and Breast excisional biopsy (Right). Family: family history includes Breast cancer (age of onset: 44) in her mother; Cancer in her mother; Hypertension in her father.  Laboratory Chemistry Profile   Renal Lab Results  Component Value Date   BUN 29 (H) 06/26/2021   CREATININE 0.79 06/26/2021   GFRAA >60 02/13/2018   GFRNONAA >60 06/26/2021    Hepatic Lab Results  Component Value Date   AST 25 06/26/2021   ALT 23 06/26/2021   ALBUMIN 4.6 06/26/2021   ALKPHOS 42 06/26/2021   LIPASE 32 06/26/2021    Electrolytes Lab Results  Component Value Date   NA 138  06/26/2021   K 3.7 06/26/2021   CL 94 (L) 06/26/2021   CALCIUM 9.5 06/26/2021   MG 2.7 (H) 02/07/2018   PHOS 3.2 02/07/2018    Bone No results found for: "VD25OH", "VD125OH2TOT", "KR8381MM0", "RF5436GO7", "25OHVITD1", "25OHVITD2", "25OHVITD3", "TESTOFREE", "TESTOSTERONE"  Inflammation (CRP: Acute Phase) (ESR: Chronic Phase) Lab Results  Component Value Date   ESRSEDRATE 48 (H) 05/25/2011         Note: Above Lab results reviewed.   Physical Exam  General appearance: Well nourished, well developed, and well hydrated. In no apparent acute distress Mental status: Alert, oriented x 3 (person, place, & time)       Respiratory: No evidence of acute respiratory distress Eyes: PERLA Vitals: BP (!) 182/80   Pulse 81   Temp 98.1 F (36.7 C)   Ht $R'5\' 6"'AE$  (1.676 m)   Wt 109 lb (49.4 kg)   SpO2 95%   BMI 17.59 kg/m  BMI: Estimated body mass index is 17.59 kg/m as calculated from the following:   Height as of this encounter: $RemoveBeforeD'5\' 6"'BWQkhGLRnmRcnZ$  (1.676 m).   Weight as of this encounter: 109 lb (49.4 kg). Ideal: Ideal body weight: 59.3 kg (130 lb 11.7 oz)  Lumbar Spine Area Exam  Skin & Axial Inspection: No masses, redness, or swelling Alignment: Symmetrical Functional ROM: Pain restricted ROM       Stability: No instability detected Muscle Tone/Strength: Functionally intact. No obvious neuro-muscular anomalies detected. Sensory (Neurological): Musculoskeletal pain pattern Palpation: No palpable anomalies       Provocative Tests: Hyperextension/rotation test: (+) bilaterally for facet joint pain.  Gait & Posture Assessment  Ambulation: Limited Gait: Antalgic Posture: WNL  Lower Extremity Exam    Side: Right lower extremity  Side: Left lower extremity  Stability: No instability observed          Stability: No instability observed          Skin & Extremity Inspection: Skin color, temperature, and hair growth are WNL. No peripheral edema or cyanosis. No masses, redness, swelling, asymmetry, or  associated skin lesions. No contractures.  Skin & Extremity Inspection: Skin color, temperature, and hair growth are WNL. No peripheral edema or cyanosis. No masses, redness, swelling, asymmetry, or associated skin lesions. No contractures.  Functional ROM: Unrestricted ROM                  Functional ROM: Unrestricted ROM                  Muscle Tone/Strength: Functionally intact. No obvious neuro-muscular anomalies detected.  Muscle Tone/Strength: Functionally intact. No obvious neuro-muscular anomalies detected.  Sensory (Neurological): Neurogenic pain pattern        Sensory (Neurological): Neurogenic pain pattern        DTR: Patellar: deferred today Achilles: deferred  today Plantar: deferred today  DTR: Patellar: deferred today Achilles: deferred today Plantar: deferred today  Palpation: No palpable anomalies  Palpation: No palpable anomalies    Assessment   Status Diagnosis  Persistent Persistent Persistent 1. Lumbar facet arthropathy   2. Post herpetic neuralgia   3. Chronic pain syndrome   4. Spondylosis without myelopathy or radiculopathy, lumbar region   5. Chronic use of opiate for therapeutic purpose        Plan of Care    Ms. BRITINY DEFRAIN has a current medication list which includes the following long-term medication(s): amlodipine, atenolol, atorvastatin, fluticasone, ipratropium-albuterol, losartan, omeprazole, and sertraline.  Pharmacotherapy (Medications Ordered): Meds ordered this encounter  Medications   HYDROcodone-acetaminophen (NORCO) 10-325 MG tablet    Sig: Take 1 tablet by mouth every 12 (twelve) hours as needed for severe pain. Must last 30 days.    Dispense:  60 tablet    Refill:  0    Chronic Pain. (STOP Act - Not applicable). Fill one day early if closed on scheduled refill date.   HYDROcodone-acetaminophen (NORCO) 10-325 MG tablet    Sig: Take 1 tablet by mouth every 12 (twelve) hours as needed for severe pain. Must last 30 days.     Dispense:  60 tablet    Refill:  0    Chronic Pain. (STOP Act - Not applicable). Fill one day early if closed on scheduled refill date.   HYDROcodone-acetaminophen (NORCO) 10-325 MG tablet    Sig: Take 1 tablet by mouth every 12 (twelve) hours as needed for severe pain. Must last 30 days.    Dispense:  60 tablet    Refill:  0    Chronic Pain. (STOP Act - Not applicable). Fill one day early if closed on scheduled refill date.   Consider RFA in future if insurance is able to cover.  Follow-up plan:   Return in about 3 months (around 02/10/2022) for Medication Management, in person.    Recent Visits Date Type Provider Dept  08/18/21 Office Visit Gillis Santa, MD [facility]  Showing recent visits within past 90 days and meeting all other requirements Today's Visits Date Type Provider Dept  11/10/21 Office Visit Gillis Santa, MD [facility]  Showing today's visits and meeting all other requirements Future Appointments Date Type Provider Dept  02/04/22 Appointment Gillis Santa, MD [facility]  Showing future appointments within next 90 days and meeting all other requirements  I discussed the assessment and treatment plan with the patient. The patient was provided an opportunity to ask questions and all were answered. The patient agreed with the plan and demonstrated an understanding of the instructions.  Patient advised to call back or seek an in-person evaluation if the symptoms or condition worsens.  Duration of encounter: 43mnutes.  Note by: BGillis Santa MD Date: 11/10/2021; Time: 2:52 PM

## 2021-11-10 NOTE — Progress Notes (Signed)
Nursing Pain Medication Assessment:  Safety precautions to be maintained throughout the outpatient stay will include: orient to surroundings, keep bed in low position, maintain call bell within reach at all times, provide assistance with transfer out of bed and ambulation.  Medication Inspection Compliance: Pill count conducted under aseptic conditions, in front of the patient. Neither the pills nor the bottle was removed from the patient's sight at any time. Once count was completed pills were immediately returned to the patient in their original bottle.  Medication: Hydrocodone/APAP Pill/Patch Count:  25 of 60 pills remain Pill/Patch Appearance: Markings consistent with prescribed medication Bottle Appearance: Standard pharmacy container. Clearly labeled. Filled Date: 07 / 13 / 2023 Last Medication intake:  Today

## 2021-11-10 NOTE — Telephone Encounter (Signed)
Insurance denied RFA. States it doesn't meet medical necessity.

## 2021-11-12 ENCOUNTER — Telehealth: Payer: Self-pay | Admitting: Pulmonary Disease

## 2021-11-12 MED ORDER — TRELEGY ELLIPTA 200-62.5-25 MCG/ACT IN AEPB: 1.0000 | INHALATION_SPRAY | Freq: Every day | RESPIRATORY_TRACT | 11 refills | Status: DC

## 2021-11-12 NOTE — Telephone Encounter (Signed)
Lm for patient.  What is her preferred pharmacy?

## 2021-11-12 NOTE — Telephone Encounter (Signed)
Trelegy has been sent to preferred pharmacy. Patient is aware and voiced her understanding.  Nothing further needed.  

## 2021-11-18 ENCOUNTER — Other Ambulatory Visit: Payer: Self-pay | Admitting: Student in an Organized Health Care Education/Training Program

## 2021-11-18 DIAGNOSIS — M47816 Spondylosis without myelopathy or radiculopathy, lumbar region: Secondary | ICD-10-CM

## 2021-11-18 DIAGNOSIS — M792 Neuralgia and neuritis, unspecified: Secondary | ICD-10-CM

## 2021-12-15 ENCOUNTER — Telehealth: Payer: Self-pay | Admitting: Student in an Organized Health Care Education/Training Program

## 2021-12-15 NOTE — Telephone Encounter (Signed)
What is the next step for this patient. I will be glad to call her

## 2021-12-15 NOTE — Telephone Encounter (Signed)
Blanch Media, Do you know the answer to this?

## 2021-12-15 NOTE — Telephone Encounter (Signed)
Patient called asking if we have tried to get her RFA approved again? If not what is next step to help with pain?

## 2021-12-16 NOTE — Telephone Encounter (Signed)
ATTEMPTED TO CALL PATIENT, NO ANSWER. LEFT MESSAGE TO CONTINUE MM AND HE WILL TALK WITH HER ON HER NEXT APPT.

## 2021-12-22 ENCOUNTER — Ambulatory Visit: Payer: Medicare PPO | Attending: Pulmonary Disease

## 2021-12-22 DIAGNOSIS — D860 Sarcoidosis of lung: Secondary | ICD-10-CM | POA: Diagnosis present

## 2021-12-22 DIAGNOSIS — Z87891 Personal history of nicotine dependence: Secondary | ICD-10-CM | POA: Insufficient documentation

## 2021-12-22 LAB — PULMONARY FUNCTION TEST ARMC ONLY
DL/VA % pred: 98 %
DL/VA: 3.97 ml/min/mmHg/L
DLCO unc % pred: 43 %
DLCO unc: 8.81 ml/min/mmHg
FEF 25-75 Post: 0.24 L/sec
FEF 25-75 Pre: 0.21 L/sec
FEF2575-%Change-Post: 14 %
FEF2575-%Pred-Post: 14 %
FEF2575-%Pred-Pre: 12 %
FEV1-%Change-Post: -4 %
FEV1-%Pred-Post: 25 %
FEV1-%Pred-Pre: 27 %
FEV1-Post: 0.58 L
FEV1-Pre: 0.61 L
FEV1FVC-%Change-Post: 8 %
FEV1FVC-%Pred-Pre: 68 %
FEV6-%Change-Post: -2 %
FEV6-%Pred-Post: 36 %
FEV6-%Pred-Pre: 37 %
FEV6-Post: 1.05 L
FEV6-Pre: 1.08 L
FEV6FVC-%Change-Post: 10 %
FEV6FVC-%Pred-Post: 105 %
FEV6FVC-%Pred-Pre: 95 %
FVC-%Change-Post: -11 %
FVC-%Pred-Post: 35 %
FVC-%Pred-Pre: 39 %
FVC-Post: 1.05 L
FVC-Pre: 1.19 L
Post FEV1/FVC ratio: 55 %
Post FEV6/FVC ratio: 100 %
Pre FEV1/FVC ratio: 51 %
Pre FEV6/FVC Ratio: 91 %
RV % pred: 79 %
RV: 1.94 L
TLC % pred: 57 %
TLC: 3.09 L

## 2021-12-24 ENCOUNTER — Encounter: Payer: Self-pay | Admitting: Pulmonary Disease

## 2021-12-24 ENCOUNTER — Ambulatory Visit: Payer: Medicare PPO | Admitting: Pulmonary Disease

## 2021-12-24 VITALS — BP 140/70 | HR 76 | Temp 98.9°F | Ht 67.0 in | Wt 112.0 lb

## 2021-12-24 DIAGNOSIS — J9611 Chronic respiratory failure with hypoxia: Secondary | ICD-10-CM | POA: Diagnosis not present

## 2021-12-24 DIAGNOSIS — I272 Pulmonary hypertension, unspecified: Secondary | ICD-10-CM | POA: Diagnosis not present

## 2021-12-24 DIAGNOSIS — I340 Nonrheumatic mitral (valve) insufficiency: Secondary | ICD-10-CM

## 2021-12-24 DIAGNOSIS — D860 Sarcoidosis of lung: Secondary | ICD-10-CM

## 2021-12-24 NOTE — Patient Instructions (Signed)
We are going to get an echocardiogram which is a heart test that will tell me about the pressures of the artery going from the heart to the lungs.  It would also tell me about the status of your leaky valves in the heart.  These issues can make your shortness of breath worse.  Continue using your Trelegy Ellipta 1 puff daily.  Continue using your albuterol as needed.  Continue your oxygen supplementation.  We will see you back in follow-up in 2 to 3 months time call sooner should any new problems arise.

## 2021-12-24 NOTE — Progress Notes (Signed)
Subjective:    Patient ID: Autumn [name], female    DOB: 10-26-1945, 76 y.o.   MRN: [id number] Patient Care Team: Gladstone Lighter, MD as PCP - General (Internal Medicine)  Chief Complaint  Patient presents with   Follow-up   HPI Autumn [name] is a 76 year old female, remote former smoker with minimal smoking history, who presents for follow-up of dyspnea on exertion wheezing and dry cough.  Previously the patient had been following with Dr. Wallene Huh for the issue of sarcoidosis, and presumed COPD.  The patient had been on Trelegy Ellipta 100 with some relief of her dyspnea but not optimal.  She is also on oxygen at 2 L/min due to chronic respiratory failure with hypoxia.  She has not had any fevers, chills or sweats of late.  She has not had any chest pain.  She has a dry cough mostly in the mornings.  No lower extremity edema.  She was initially evaluated here on 15 October 2021 and at that time Trelegy was increased to 200.  She notes that this has helped some though she continues to have some significant shortness of breath.   She has been followed by Dr. Bartholome Bill for her cardiac issues.  Most recent echocardiogram was performed on 01 October 2020 showed an LVEF of 50 to 55%, moderate AR, moderate MR and moderate TR.   Previously CT chest showed significant bilateral upper lobe predominant scarring, left apical cystic changes with traction bronchiectasis and scattered nodularity.  Consistent with sarcoidosis, as are quite severe.  She had pulmonary function testing performed 22 December 2021 mostly consistent with restrictive physiology though concomitant obstruction is noted by flow volume loop.     DATA: Pulmonary function data obtained from [facility] done on 24 Aug 2021 are as follows: SPIROMETRY: FVC was 0.68 Liters, 26% of predicted FEV1 was 0.53 Liters, 27% of predicted FEV1 ratio was 78.26%, 101% of predicted FEF 25-75% Liters per second was 0.44, 25% of  predicted DIFFUSION CAPACITY: DLCO was 7.14 mL/min/mmHg VA was 2.90 Liters DLCO/VA was 2.49 mL/min/mmHg/L LUNG VOLUMES: TLC was 3.43 Liters RV was 2.75 Liters Impression Spirometry is c/w more so of restrictive TLC is mildly decreased DLCO is severely decreased, dlco/va is moderately decreased  Study interpreted by Dr. Wallene Huh.  12/22/2021 PFTs [facility]: FEV1 0.61 L or 27% predicted, FVC 1.19 L or 39% predicted, FEV1/FVC 51% lung volumes moderately reduced diffusion capacity severely reduced.  Compared to prior study of 24 Aug 2021 has been some slight improvement on spirometric values but worsening of diffusion capacity.  Review of Systems A 10 point review of systems was performed and it is as noted above otherwise negative.  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   Depression, prolonged 04/07/2018   Acute on chronic respiratory failure with hypoxia [facility])    Palliative care encounter    Protein-calorie malnutrition, severe 02/06/2018   Acute respiratory failure ([place]) 02/04/2018   COPD (chronic obstructive pulmonary disease) ([place]) 03/22/2017   Post herpetic neuralgia 07/30/2016   Hypercalcemia 02/17/2016   Chronic angle-closure glaucoma of both eyes, severe  stage 01/20/2016   Age-related nuclear cataract of both eyes 12/17/2015   Pneumonia 11/17/2015   Long term current use of systemic steroids 02/14/2015   Pulmonary hypertension ([place]) 10/05/2013   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 1 pack lasted >2 weeks.  Substance Use Topics   Alcohol use: No   Allergies  Allergen Reactions   Ivp Dye [Iodinated Contrast Media] Hives and Itching   No outpatient medications have been marked as taking for the 12/24/21 encounter (Office Visit) with Tyler Pita, MD.   Immunization History  Administered Date(s) Administered   Influenza Split 12/17/2016   Influenza, High Dose Seasonal PF 01/03/2017, 12/16/2017, 12/22/2018   Influenza-Unspecified 01/28/2011, 01/10/2012, 03/12/2013, 02/09/2014, 02/04/2015, 01/25/2016, 12/20/2019   PFIZER(Purple Top)SARS-COV-2 Vaccination 05/15/2019, 06/12/2019   Pneumococcal Conjugate-13 02/17/2016   Pneumococcal Polysaccharide-23 05/03/2011   Zoster Recombinat (Shingrix) 10/14/2017, 12/16/2017       Objective:   Physical Exam BP (!) 140/70 (BP Location: Left Arm, Patient Position: Sitting, Cuff Size: Normal)   Pulse 76   Temp 98.9 F (37.2 C) (Oral)   Ht '5\' 7"'$  (1.702 m)   Wt 112 lb (50.8 kg)   SpO2 99%   BMI 17.54 kg/m   SpO2: 99 % O2 Device: Nasal cannula O2 Flow Rate (L/min): 2 L/min O2 Type: Pulse O2  GENERAL: Chronically ill-appearing woman, presents ambulating with help of a walker.  On supplemental oxygen at 2 L/min. HEAD: Normocephalic, atraumatic.  EYES: Pupils equal, round, reactive to light.  No scleral icterus.  MOUTH: Poor dentition, oral mucosa moist. NECK: Supple. No thyromegaly. Trachea midline. No JVD.  No adenopathy. PULMONARY: Good air entry bilaterally.  No adventitious sounds. CARDIOVASCULAR: S1 and S2. Regular rate and rhythm.  Loud mitral regurgitation murmur. ABDOMEN:  Benign. MUSCULOSKELETAL: Significant dextro scoliosis, no clubbing, no edema.  NEUROLOGIC: No overt focal deficit. SKIN: Intact,warm,dry. PSYCH: Flat affect, normal behavior.     Assessment & Plan:     ICD-10-CM   1. Pulmonary sarcoidosis ([place])  D86.0    "Burnt out" sarcoid Significant scarring on chest CT    2. Chronic respiratory failure with hypoxia ([facility])  J96.11    Continue oxygen at 2 L/min Patient compliant with therapy    3. Pulmonary hypertension ([facility])  I27.20 ECHOCARDIOGRAM COMPLETE   Needs to be reassessed May be adding to her issues with dyspnea Echocardiogram ordered    4. Nonrheumatic mitral valve regurgitation  I34.0    Needs to be reassessed May be adding to her issues with dyspnea     Orders Placed This Encounter  Procedures   ECHOCARDIOGRAM COMPLETE    Standing Status:   Future    Number of Occurrences:   1    Standing Expiration Date:   12/25/2022    Order Specific Question:   Where should this test be performed    Answer:   [facility]    Order Specific Question:   Perflutren DEFINITY (image enhancing agent) should be administered unless hypersensitivity or allergy exist    Answer:   Administer Perflutren    Order Specific Question:  Reason for exam-Echo    Answer:   Pulmonary hypertension I27.2   Patient is to continue Trelegy for now.  We will reassess her cardiac issues as I suspect these are adding to her sensation of dyspnea.  She is to follow-up in 2 to 3 months time she is to contact us prior to that time should any new difficulties arise.  Renold Don, MD Advanced Bronchoscopy [facility]    *This note was dictated using voice recognition software/Dragon.  Despite best efforts to proofread, errors can occur which can change the meaning. Any transcriptional errors that result from this process are unintentional and may not be fully corrected at the time of dictation.

## 2022-01-12 ENCOUNTER — Ambulatory Visit
Admission: RE | Admit: 2022-01-12 | Discharge: 2022-01-12 | Disposition: A | Discharge status: Home / Self Care | Payer: Medicare PPO | Source: Ambulatory Visit | Attending: Pulmonary Disease | Admitting: Pulmonary Disease

## 2022-01-12 DIAGNOSIS — I080 Rheumatic disorders of both mitral and aortic valves: Secondary | ICD-10-CM | POA: Diagnosis not present

## 2022-01-12 DIAGNOSIS — J449 Chronic obstructive pulmonary disease, unspecified: Secondary | ICD-10-CM | POA: Insufficient documentation

## 2022-01-12 DIAGNOSIS — I119 Hypertensive heart disease without heart failure: Secondary | ICD-10-CM | POA: Insufficient documentation

## 2022-01-12 DIAGNOSIS — I272 Pulmonary hypertension, unspecified: Secondary | ICD-10-CM | POA: Diagnosis present

## 2022-01-12 DIAGNOSIS — F17200 Nicotine dependence, unspecified, uncomplicated: Secondary | ICD-10-CM | POA: Diagnosis not present

## 2022-01-12 DIAGNOSIS — E785 Hyperlipidemia, unspecified: Secondary | ICD-10-CM | POA: Diagnosis not present

## 2022-01-12 LAB — ECHOCARDIOGRAM COMPLETE
AR max vel: 2.4 cm2
AV Area VTI: 2.2 cm2
AV Area mean vel: 2.17 cm2
AV Mean grad: 5 mmHg
AV Peak grad: 8.3 mmHg
Ao pk vel: 1.44 m/s
Area-P 1/2: 5.97 cm2
P 1/2 time: 735 msec
S' Lateral: 1.6 cm

## 2022-01-22 ENCOUNTER — Telehealth: Payer: Self-pay

## 2022-01-22 NOTE — Telephone Encounter (Signed)
[facility] doesn't have her meds. She called [facility] but they said they couldn't tell her if they have it or not, that the doctors office has to call to see. If they have it, she wants to get her meds from [facility].

## 2022-01-22 NOTE — Telephone Encounter (Signed)
[facility] does not have the medication in stock at this time.  Patient notified.

## 2022-01-25 ENCOUNTER — Telehealth: Payer: Self-pay | Admitting: *Deleted

## 2022-01-25 ENCOUNTER — Telehealth: Payer: Self-pay | Admitting: Student in an Organized Health Care Education/Training Program

## 2022-01-25 ENCOUNTER — Telehealth: Payer: Self-pay | Admitting: Pulmonary Disease

## 2022-01-25 DIAGNOSIS — B0229 Other postherpetic nervous system involvement: Secondary | ICD-10-CM

## 2022-01-25 DIAGNOSIS — G894 Chronic pain syndrome: Secondary | ICD-10-CM

## 2022-01-25 NOTE — Telephone Encounter (Signed)
Rx request sent to Dr. Lateef 

## 2022-01-25 NOTE — Telephone Encounter (Signed)
Her echocardiogram shows thickness of the main chamber of the heart and some increased pressure on the artery going from the heart to the lungs.  This may be adding to her shortness of breath.  Does she see a cardiologist (heart doctor)?  We will be happy to refer if needed.   Patient is aware of results and voiced her understanding.  She follows with Cardiology tomorrow.  Nothing further needed.

## 2022-01-25 NOTE — Telephone Encounter (Signed)
PT wanted refill of hydrocodone 10 mg to be send in to [facility] in [place]. [phone]. PT will like t get an call back when its done, cause she will be having someone to pick it up for her. Thanks

## 2022-01-26 ENCOUNTER — Other Ambulatory Visit: Payer: Self-pay | Admitting: *Deleted

## 2022-01-26 DIAGNOSIS — G894 Chronic pain syndrome: Secondary | ICD-10-CM

## 2022-01-26 DIAGNOSIS — B0229 Other postherpetic nervous system involvement: Secondary | ICD-10-CM

## 2022-01-26 MED ORDER — HYDROCODONE-ACETAMINOPHEN 10-325 MG PO TABS: 1.0000 | ORAL_TABLET | Freq: Two times a day (BID) | ORAL | 0 refills | Status: AC | PRN

## 2022-01-26 NOTE — Telephone Encounter (Signed)
Autumn [name] does not have her medications to fill. [place] [facility] does have '5mg'$ . Would dr Holley Raring want to change her medication to that

## 2022-01-26 NOTE — Telephone Encounter (Signed)
Patient notified

## 2022-01-26 NOTE — Telephone Encounter (Signed)
Rx request sent to Dr. Lateef 

## 2022-02-04 ENCOUNTER — Encounter: Payer: Medicare PPO | Admitting: Student in an Organized Health Care Education/Training Program

## 2022-02-09 ENCOUNTER — Encounter: Payer: Medicare PPO | Admitting: Student in an Organized Health Care Education/Training Program

## 2022-02-25 ENCOUNTER — Encounter: Payer: Self-pay | Admitting: Pulmonary Disease

## 2022-02-25 ENCOUNTER — Ambulatory Visit: Payer: Medicare PPO | Admitting: Pulmonary Disease

## 2022-02-25 VITALS — BP 124/78 | HR 72 | Temp 98.3°F | Ht 67.0 in | Wt 112.4 lb

## 2022-02-25 DIAGNOSIS — D860 Sarcoidosis of lung: Secondary | ICD-10-CM

## 2022-02-25 DIAGNOSIS — I340 Nonrheumatic mitral (valve) insufficiency: Secondary | ICD-10-CM

## 2022-02-25 DIAGNOSIS — J9611 Chronic respiratory failure with hypoxia: Secondary | ICD-10-CM

## 2022-02-25 DIAGNOSIS — I272 Pulmonary hypertension, unspecified: Secondary | ICD-10-CM

## 2022-02-25 MED ORDER — TRELEGY ELLIPTA 200-62.5-25 MCG/ACT IN AEPB: 1.0000 | INHALATION_SPRAY | Freq: Every day | RESPIRATORY_TRACT | 0 refills | Status: DC

## 2022-02-25 MED ORDER — TRELEGY ELLIPTA 200-62.5-25 MCG/ACT IN AEPB
1.0000 | INHALATION_SPRAY | Freq: Every day | RESPIRATORY_TRACT | 0 refills | Status: DC
Start: 2022-02-25 — End: 2022-03-30

## 2022-02-25 NOTE — Progress Notes (Signed)
Subjective:    Patient ID: Autumn [name], female    DOB: [birth year], 76 y.o.   MRN: [id number] Patient Care Team: Enid Baas, MD as PCP - General (Internal Medicine) Salena Saner, MD as Consulting Physician (Pulmonary Disease)  Chief Complaint  Patient presents with   Follow-up    No SOB. Occasional dry cough and wheezing.    HPI Autumn [name] is a 76 year old female, remote former smoker with minimal smoking history, who presents for follow-up of dyspnea on exertion wheezing and dry cough. Previously the patient had been following with Dr. 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  The patient had been on Trelegy Ellipta 100 with some relief of her dyspnea but not optimal.  At her prior visit in September, she had Trelegy increased to the 200 mcg strength.  Since this change she notes that her cough and wheezing is better and she does not endorse any significant dyspnea over baseline.  She is on oxygen at 2 L/min due to chronic respiratory failure with hypoxia.  She has not had any fevers, chills or sweats of late.  She has not had any chest pain.  She has a dry cough mostly in the mornings.  No lower extremity edema.  She was initially evaluated here on 15 October 2021 and at that time Trelegy was increased to 200.  She notes that this has helped some and her shortness of breath is not as pronounced.  She follows with cardiology at [facility].  Her most recent echocardiogram was performed 13 January 2023 and shows a hyperdynamic/hypertrophic left ventricle. Previously, CT chest showed significant bilateral upper lobe predominant scarring, left apical cystic changes with traction bronchiectasis and scattered nodularity. Consistent with sarcoidosis, as are quite severe.   She had pulmonary function testing performed 22 December 2021 mostly consistent with restrictive physiology though concomitant obstruction is noted by flow volume loop.      DATA: Pulmonary function data obtained from [facility] done on 24 Aug 2021 are as follows: SPIROMETRY: FVC was 0.68 Liters, 26% of predicted FEV1 was 0.53 Liters, 27% of predicted FEV1 ratio was 78.26%, 101% of predicted FEF 25-75% Liters per second was 0.44, 25% of predicted DIFFUSION CAPACITY: DLCO was 7.14 mL/min/mmHg VA was 2.90 Liters DLCO/VA was 2.49 mL/min/mmHg/L LUNG VOLUMES: TLC was 3.43 Liters RV was 2.75 Liters Impression Spirometry is c/w more so of restrictive TLC is mildly decreased DLCO is severely decreased, dlco/va is moderately decreased  Study interpreted by Dr. Ned Clines.   12/22/2021 PFTs [facility]: FEV1 0.61 L or 27% predicted, FVC 1.19 L or 39% predicted, FEV1/FVC 51% lung volumes moderately reduced diffusion capacity severely reduced.  Compared to prior study of 24 Aug 2021 has been some slight improvement on spirometric values but worsening of diffusion capacity. 01/13/2023 echocardiogram: LVEF 70 to 75%, hyperdynamic left ventricular function.  No wall motion abnormalities.  Moderate left ventricular hypertrophy, indeterminate diastolics.  The RV size is normal, there is severely increased right ventricular wall thickness, RV function is normal.  Mildly elevated pulmonary artery systolic pressure.  Estimated right ventricular systolic pressure is 40.2 mmHg.  Mild mitral regurgitation.  Mild to moderate pulmonic valve regurgitation.  Review of Systems A 10 point review of systems was performed and it is as noted above otherwise negative.  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   Depression, prolonged 04/07/2018   Acute on chronic respiratory failure with hypoxia [facility])    Palliative care encounter    Protein-calorie malnutrition, severe 02/06/2018   Acute respiratory failure (HCC) 02/04/2018   COPD (chronic obstructive pulmonary disease) (HCC) 03/22/2017   Post herpetic neuralgia 07/30/2016   Hypercalcemia 02/17/2016   Chronic angle-closure glaucoma of both eyes, 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 1 pack lasted >2 weeks.  Substance Use Topics   Alcohol use: No   Allergies  Allergen Reactions   Ivp Dye [Iodinated Contrast Media] Hives and Itching   Current Meds  Medication Sig   acetaminophen (TYLENOL) 500 MG tablet Take 500 mg by mouth every 4 (four) hours as needed for mild pain or moderate pain.   albuterol (VENTOLIN HFA) 108 (90 Base) MCG/ACT inhaler INHALE 2 PUFFS BY MOUTH EVERY 6 HOURS AS NEEDED FOR WHEEZE   amLODipine (NORVASC) 5 MG tablet Take 2.5 mg by mouth daily.   atenolol (TENORMIN) 50 MG tablet Take 50 mg by mouth 2 (two) times daily.    atorvastatin (LIPITOR) 10 MG tablet Take 1 tablet by mouth daily.    cetirizine (ZYRTEC) 10 MG tablet Take 10 mg by mouth as needed for allergies.   fluticasone (FLONASE) 50 MCG/ACT nasal spray Place 2 sprays into both nostrils daily as needed.    HYDROcodone-acetaminophen  (NORCO) 10-325 MG tablet Take 1 tablet by mouth every 12 (twelve) hours as needed for severe pain. Must last 30 days.   ipratropium-albuterol (DUONEB) 0.5-2.5 (3) MG/3ML SOLN Take 3 mLs by nebulization every 6 (six) hours as needed.   losartan (COZAAR) 50 MG tablet Take 1 tablet by mouth daily.   ondansetron (ZOFRAN-ODT) 4 MG disintegrating tablet Take 1 tablet (4 mg total) by mouth every 8 (eight) hours as needed.   polyethylene glycol powder (GLYCOLAX/MIRALAX) 17 GM/SCOOP powder Take by mouth.   predniSONE (DELTASONE) 5 MG tablet Take 1 tablet by mouth daily.   Propylene Glycol 0.6 % SOLN Place 1 drop into both eyes daily as needed (dryness).   sertraline (ZOLOFT) 25 MG tablet Take 25 mg by mouth daily.   Immunization History  Administered Date(s) Administered   Fluad Quad(high Dose 65+) 12/22/2020   Influenza Inj Mdck Quad Pf 01/04/2022   Influenza Split 12/17/2016   Influenza, High Dose Seasonal PF 01/03/2017, 12/16/2017, 12/22/2018   Influenza-Unspecified 01/28/2011, 01/10/2012, 03/12/2013, 02/09/2014, 02/04/2015, 01/25/2016, 12/20/2019   PFIZER(Purple Top)SARS-COV-2 Vaccination 05/15/2019, 06/12/2019   Pneumococcal Conjugate-13 02/17/2016   Pneumococcal Polysaccharide-23 05/03/2011   Zoster Recombinat (Shingrix) 10/14/2017, 12/16/2017      Objective:   Physical Exam BP 124/78 (BP  Location: Left Arm, Cuff Size: Normal)   Pulse 72   Temp 98.3 F (36.8 C)   Ht 5\' 7"  (1.702 m)   Wt 112 lb 6.4 oz (51 kg)   SpO2 100%   BMI 17.60 kg/m   SpO2: 100 % O2 Device: Nasal cannula O2 Flow Rate (L/min): 2 L/min  GENERAL: Chronically ill-appearing woman, presents ambulating with help of a walker.  On supplemental oxygen at 2 L/min. HEAD: Normocephalic, atraumatic.  EYES: Pupils equal, round, reactive to light.  No scleral icterus.  MOUTH: Poor dentition, oral mucosa moist. NECK: Supple. No thyromegaly. Trachea midline. No JVD.  No adenopathy. PULMONARY: Good air entry bilaterally.  No  adventitious sounds. CARDIOVASCULAR: S1 and S2. Regular rate and rhythm.  Loud mitral regurgitation murmur. ABDOMEN: Benign. MUSCULOSKELETAL: Significant dextro scoliosis, no clubbing, no edema.  NEUROLOGIC: No overt focal deficit. SKIN: Intact,warm,dry. PSYCH: Flat affect, normal behavior.     Assessment & Plan:     ICD-10-CM   1. Pulmonary sarcoidosis (HCC)  D86.0    "Burnt out" sarcoid Significant residual scarring Does not appear to be active    2. Chronic respiratory failure with hypoxia (HCC)  J96.11    Patient compliant with oxygen at 2 L/min Continue oxygen therapy    3. Pulmonary hypertension (HCC)  I27.20    Will need ongoing monitoring Likely related to sarcoidosis    4. Nonrheumatic mitral valve regurgitation  I34.0    Patient follows with cardiology     Meds ordered this encounter  Medications   Fluticasone-Umeclidin-Vilant (TRELEGY ELLIPTA) 200-62.5-25 MCG/ACT AEPB    Sig: Inhale 1 puff into the lungs daily.    Dispense:  14 each    Refill:  0    Order Specific Question:   Lot Number?    Answer:   [id number]    Order Specific Question:   Expiration Date?    Answer:   04/13/2023    Order Specific Question:   Quantity    Answer:   1   Fluticasone-Umeclidin-Vilant (TRELEGY ELLIPTA) 200-62.5-25 MCG/ACT AEPB    Sig: Inhale 1 puff into the lungs daily.    Dispense:  14 each    Refill:  0    Order Specific Question:   Lot Number?    Answer:   [id number]    Order Specific Question:   Expiration Date?    Answer:   08/11/2023    Order Specific Question:   Quantity    Answer:   1   Will see the patient in follow-up in 4 to 6 months time she is to contact us prior to that time should any new difficulties arise  C. Danice Goltz, MD Advanced Bronchoscopy P[facility]  [facility]    *This note was dictated using voice recognition software/Dragon.  Despite best efforts to proofread, errors can occur which can change the meaning. Any transcriptional errors that  result from this process are unintentional and may not be fully corrected at the time of dictation.

## 2022-02-25 NOTE — Patient Instructions (Signed)
Continue using your inhalers as you are doing.  Continue using the oxygen.  I recommend you get the RSV vaccine this year.  This vaccine lasts 2 years.  This is a virus that usually children get but we have seen a resurgence in adults.  Can get this vaccine at [facility] or [facility].  We will see you in follow-up in 4 to 6 months time call sooner should any new problems arise.

## 2022-03-10 ENCOUNTER — Inpatient Hospital Stay: Payer: Medicare PPO | Attending: Obstetrics and Gynecology | Admitting: Obstetrics and Gynecology

## 2022-03-10 VITALS — BP 135/71 | HR 75 | Temp 98.7°F | Resp 20 | Wt 116.8 lb

## 2022-03-10 DIAGNOSIS — E785 Hyperlipidemia, unspecified: Secondary | ICD-10-CM | POA: Diagnosis not present

## 2022-03-10 DIAGNOSIS — B0229 Other postherpetic nervous system involvement: Secondary | ICD-10-CM | POA: Diagnosis not present

## 2022-03-10 DIAGNOSIS — G894 Chronic pain syndrome: Secondary | ICD-10-CM | POA: Insufficient documentation

## 2022-03-10 DIAGNOSIS — F32A Depression, unspecified: Secondary | ICD-10-CM | POA: Insufficient documentation

## 2022-03-10 DIAGNOSIS — H5440 Blindness, one eye, unspecified eye: Secondary | ICD-10-CM | POA: Diagnosis not present

## 2022-03-10 DIAGNOSIS — N893 Dysplasia of vagina, unspecified: Secondary | ICD-10-CM

## 2022-03-10 DIAGNOSIS — I272 Pulmonary hypertension, unspecified: Secondary | ICD-10-CM | POA: Insufficient documentation

## 2022-03-10 DIAGNOSIS — M47816 Spondylosis without myelopathy or radiculopathy, lumbar region: Secondary | ICD-10-CM | POA: Diagnosis not present

## 2022-03-10 DIAGNOSIS — J449 Chronic obstructive pulmonary disease, unspecified: Secondary | ICD-10-CM | POA: Insufficient documentation

## 2022-03-10 DIAGNOSIS — I10 Essential (primary) hypertension: Secondary | ICD-10-CM | POA: Diagnosis not present

## 2022-03-10 DIAGNOSIS — Z9981 Dependence on supplemental oxygen: Secondary | ICD-10-CM | POA: Insufficient documentation

## 2022-03-10 DIAGNOSIS — M5414 Radiculopathy, thoracic region: Secondary | ICD-10-CM | POA: Insufficient documentation

## 2022-03-10 DIAGNOSIS — M810 Age-related osteoporosis without current pathological fracture: Secondary | ICD-10-CM | POA: Diagnosis not present

## 2022-03-10 DIAGNOSIS — D84821 Immunodeficiency due to drugs: Secondary | ICD-10-CM | POA: Insufficient documentation

## 2022-03-10 DIAGNOSIS — J9610 Chronic respiratory failure, unspecified whether with hypoxia or hypercapnia: Secondary | ICD-10-CM | POA: Diagnosis not present

## 2022-03-10 DIAGNOSIS — Z79899 Other long term (current) drug therapy: Secondary | ICD-10-CM | POA: Insufficient documentation

## 2022-03-10 DIAGNOSIS — K6282 Dysplasia of anus: Secondary | ICD-10-CM | POA: Diagnosis not present

## 2022-03-10 DIAGNOSIS — D860 Sarcoidosis of lung: Secondary | ICD-10-CM | POA: Insufficient documentation

## 2022-03-10 DIAGNOSIS — Z08 Encounter for follow-up examination after completed treatment for malignant neoplasm: Secondary | ICD-10-CM

## 2022-03-10 DIAGNOSIS — Z8543 Personal history of malignant neoplasm of ovary: Secondary | ICD-10-CM | POA: Diagnosis present

## 2022-03-10 DIAGNOSIS — D071 Carcinoma in situ of vulva: Secondary | ICD-10-CM

## 2022-03-10 DIAGNOSIS — Z87891 Personal history of nicotine dependence: Secondary | ICD-10-CM | POA: Insufficient documentation

## 2022-03-10 DIAGNOSIS — G629 Polyneuropathy, unspecified: Secondary | ICD-10-CM | POA: Diagnosis not present

## 2022-03-10 NOTE — Progress Notes (Signed)
Gynecologic Oncology Consult Visit   Referring Provider: Dr Manfred Shirts  Chief Complaint: VIN & Ovarian Cancer (surveillance)  Subjective:  Autumn [name] is a 77 y.o. female who is seen in consultation from Dr. 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   Overall she is doing well today.  She presents for follow-up Pap smear and pelvic exam.  Oncologic & Gynecologic History:  Referred from PCP for pelvic mass in 11/2009 with vaginal discomfort and urinary urgency. She was s/p TAH in early 1990s for benign causes. CT A/P on 12/17/09 showed solid appearing pelvic mass. On 01/07/10 she underwent ex-lap, BSO, infragastric omentectomy, left pelvic and periaortic LN dissection, and peritoneal biopsies.   Final pathology revealed IA, grade 2, endometrioid adenocarcinoma with focal squamous differentiation involving the left ovary. Tumor size 15.6 cm, Tumor removed intact without surface involvement. Negative right ovary, peritoneal bipsies, sidewall biopsies, gutter biopsies, culdesac biopsy, omentum, 20 left pelvic LNs, 2 left periaortic LNs, 3 left common LNs, and washings.   CA 125 at time of surgery was 677.   She received adjuvant chemotherapy with carboplatin and paclitaxel x 3 cycles, from 02/09/10-03/23/10. CA 125 at completion of chemotherapy was 15.   August 2013- CA 125 increased to 96.8 at time she experienced flare of her sarcoidosis. Follow up CA 125 decreased to 26.1 in 12/2011.   12/28/10- dyspareunia and vulvar biopsy revealed VIN 3. She went to OR on 01/23/11 for WLE which returned VIN 3 to cauterized margin. Repeat biopsy 11/15/11 revealed VIN 3.   Pap 05/15/12 returned LSIL. She was evaluated by Dr Maye Hides as a second opinion for her vulvar dysplasia and abnormal pap on 05/30/12.   Vaginal biopsy on 05/30/12 returned VaIN-3, lesion was biopsied from the left vaginal fornix at 3:00. At that time she  declined to proceed with further resection of her vulva as she desired to postpone surgery until her sarcoid was improved.   On 07/05/12 pt underwent colposcopy, partial skinning vulvectomy/wide local excision and laser ablation of vaginal lesions. The final pathology revealed VIN 3 extending to surgical resection margin.    Pap on 11/28/13 showed LSIL.    12/23/14: pap - LSIL/HPV positive, vulvar biopsy on 12/23/14 with VIN 3. Mild dysplasia on the right and left introitus. Initiated Aldara 01/2015.    07/14/15: Vulva biopsy with VIN 3.    09/26/2015 Patent underwent laser ablation with Dr. Erasmo Leventhal    05/31/16: CA 125 - 15.3, colposcopy with no acetowhite changes.   11/29/16: LSIL/HPV(+) vaginal pap, vaginal and vulvar colposcopy unremarkable.   06/13/17: Colposcopy of the vagina, with biopsy of the left vulva consistent with vascular ectasia. CA 125 13.8    12/26/17: Routine surveillance. Colposcopy of the vagina was unremarkable, exam of vulva was normal. Exam of perianal region was notable for hyperpigmented and hypopigmented lesions. CA 125 stable at 14.1. perianal biopsy showed PAIN-3   09/20/18: Mapping anal biopsies with Gen Surg showed PAIN-2   11/06/2018: Vaginal pap LSIL + HR HPV/ + type 16 / negative 18   11/06/18: vulvar/vaginal colposcopy:  Acetowhitening seen on right vulva 7:00-9:00, left vulvar vaginal junction at 4:00, posterior perianal area 5:00-7:00. Diffuse minimal acetowhitening on vaginal mucosa   11/2018: Combined procedure with GSU cancelled due to uncontrolled HTN.   06/2019: clinic visit with Dr Laurance Flatten - colorectal surgery for surveillance of AIN / PaIN. Instructed to start cidofovir.    09/14/19: clinic visit  with Dr Laurance Flatten - colorectal surgery. Anoscopy was tolerated and showed no abnormalities. External exam : without significant change to the previously noted L posterior 2x4 cm hypopigmented perianal area.   09/17/19: Clinic visit with Dr Erasmo Leventhal - gyn oncology. She  reported application of cidofovir to vulva and perianal area 5 days on and 5 days off. Exam findings: Vagina/External genitalia: collection of white raised 1 mm circular lesions at vaginal introitus from 5:00 to 7:00 consistent with previous exam. Rectal: Aceto white change with collected 1 mm round raised lesions 7:00 to 8:00, 2 mm dark lesion at 7:00 in peri anal area. Recommend to continue cidofovir to vulva/perianal areas   12/11/19: Clinic visit with gyn oncology. Colposcopy / biopsies deferred given Dr Erasmo Leventhal not in clinic at this time. Vulvar exam reassuring. Small area of white plaque just next to urethra on left. Bilateral skin breakdown on buttocks adjacent to rectum.   12/14/19 Clinic visit with Dr. Laurance Flatten - colorectal surgery. Cidofivir held in setting of extreme skin reaction. Plan for follow up in 3 months for surveillance and consideration of restarting cidofivir perianally  03/14/20 Clinic visit with Dr Laurance Flatten, transitioned to Aldara  06/2020: discontinued Aldara.   08/11/20. On exam, she had colpo with very mild acetowhite changes along the posterior introitus from 5-7, areas are < 5 mm and not concerning for high grade process. Vagina was atrophic. Otherwise unremarkable.   03/02/21- Saw Dr. Erasmo Leventhal at [facility]. Clinically, no evidence of high grade dysplasia or invasive cancer. Pap obtained- LSIL, ASUC- cannot rule out high grade lesion. HPVHR postiive - HPV16+ She had limited colposcopy d/t inability to lay flat in setting of lung disease. She elected for less aggressive follow up and to move care to [facility] after the passing of her husband.   She presents today for follow up and pap.       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   Depression, prolonged 04/07/2018   Acute on chronic respiratory failure with hypoxia [facility])    Palliative care encounter    Protein-calorie malnutrition, severe 02/06/2018   Acute respiratory failure ([place]) 02/04/2018   COPD (chronic obstructive pulmonary disease) ([place]) 03/22/2017   Post herpetic neuralgia 07/30/2016   Hypercalcemia 02/17/2016   Chronic angle-closure glaucoma of both eyes, severe stage 01/20/2016   Age-related nuclear cataract of both eyes 12/17/2015   Pneumonia 11/17/2015   Long term current use of systemic steroids 02/14/2015   Pulmonary hypertension ([place]) 10/05/2013   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   Abnormal Pap smear of vagina 05/31/2012   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   Ovarian cancer ([place]) 12/11/2009   Pulmonary sarcoidosis ([place]) 10/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    Past Medical History: Past Medical History:  Diagnosis Date   Allergy    Anemia    Arthritis    COPD (chronic obstructive pulmonary disease) ([place])    Glaucoma    H/O uvulectomy    Hypertension    Osteoporosis    Ovarian cancer ([place])    Postherpetic neuralgia    Pulmonary sarcoidosis ([place])  Sarcoid     Past Surgical History: Past Surgical History:  Procedure Laterality Date   ABDOMINAL HYSTERECTOMY     BREAST EXCISIONAL BIOPSY Right    benign   COLONOSCOPY WITH PROPOFOL N/A 07/30/2019   Procedure: COLONOSCOPY WITH PROPOFOL;  Surgeon: Toledo, Benay Pike, MD;  Location: [facility] ENDOSCOPY;  Service: Gastroenterology;  Laterality: N/A;   DIAGNOSTIC LAPAROSCOPY     DILATION AND CURETTAGE OF UTERUS     EYE SURGERY     VULVAR LESION REMOVAL      Past Gynecologic History:  As per interval history and history of present illness   OB History: P2 NSVD x 2 OB  History  No obstetric history on file.    Family History: Family History  Problem Relation Age of Onset   Breast cancer Mother 56   Cancer Mother    Hypertension Father     Social History: Social History   Socioeconomic History   Marital status: Married    Spouse name: Not on file   Number of children: Not on file   Years of education: Not on file   Highest education level: Not on file  Occupational History   Not on file  Tobacco Use   Smoking status: Former    Years: 20.00    Types: Cigarettes   Smokeless tobacco: Never   Tobacco comments:    Quit smoking between age [age]. 1 pack lasted >2 weeks.  Vaping Use   Vaping Use: Never used  Substance and Sexual Activity   Alcohol use: No   Drug use: No   Sexual activity: Not Currently    Birth control/protection: None  Other Topics Concern   Not on file  Social History Narrative   Not on file   Social Determinants of Health   Financial Resource Strain: Not on file  Food Insecurity: Not on file  Transportation Needs: Not on file  Physical Activity: Not on file  Stress: Not on file  Social Connections: Not on file  Intimate Partner Violence: Not on file    Allergies: Allergies  Allergen Reactions   Ivp Dye [Iodinated Contrast Media] Hives and Itching    Current Medications: Current Outpatient Medications  Medication Sig Dispense Refill   acetaminophen (TYLENOL) 500 MG tablet Take 500 mg by mouth every 4 (four) hours as needed for mild pain or moderate pain.     albuterol (VENTOLIN HFA) 108 (90 Base) MCG/ACT inhaler INHALE 2 PUFFS BY MOUTH EVERY 6 HOURS AS NEEDED FOR WHEEZE     amLODipine (NORVASC) 5 MG tablet Take 2.5 mg by mouth daily.     atenolol (TENORMIN) 50 MG tablet Take 50 mg by mouth 2 (two) times daily.      atorvastatin (LIPITOR) 10 MG tablet Take 1 tablet by mouth daily.      cetirizine (ZYRTEC) 10 MG tablet Take 10 mg by mouth as needed for allergies.     Fluticasone-Umeclidin-Vilant (TRELEGY  ELLIPTA) 200-62.5-25 MCG/ACT AEPB Inhale 1 puff into the lungs daily. 60 each 11   Fluticasone-Umeclidin-Vilant (TRELEGY ELLIPTA) 200-62.5-25 MCG/ACT AEPB Inhale 1 puff into the lungs daily. 14 each 0   Fluticasone-Umeclidin-Vilant (TRELEGY ELLIPTA) 200-62.5-25 MCG/ACT AEPB Inhale 1 puff into the lungs daily. 14 each 0   ipratropium-albuterol (DUONEB) 0.5-2.5 (3) MG/3ML SOLN Take 3 mLs by nebulization every 6 (six) hours as needed. 360 mL 0   losartan (COZAAR) 50 MG tablet Take 1 tablet by mouth daily.     mirtazapine (REMERON) 7.5 MG tablet Take 7.5  mg by mouth at bedtime.     ondansetron (ZOFRAN-ODT) 4 MG disintegrating tablet Take 1 tablet (4 mg total) by mouth every 8 (eight) hours as needed. 20 tablet 0   polyethylene glycol powder (GLYCOLAX/MIRALAX) 17 GM/SCOOP powder Take by mouth.     predniSONE (DELTASONE) 5 MG tablet Take 1 tablet by mouth daily.     Propylene Glycol 0.6 % SOLN Place 1 drop into both eyes daily as needed (dryness).     Romosozumab-aqqg (EVENITY) 105 MG/1.17ML SOSY injection Inject 210 mg into the skin. Once a month     sertraline (ZOLOFT) 25 MG tablet Take 25 mg by mouth daily.  11   fluticasone (FLONASE) 50 MCG/ACT nasal spray Place 2 sprays into both nostrils daily as needed.  (Patient not taking: Reported on 02/25/2022)     omeprazole (PRILOSEC) 20 MG capsule Take 1 tablet by mouth daily. (Patient not taking: Reported on 12/24/2021)     predniSONE (DELTASONE) 10 MG tablet Take 1 tablet (10 mg total) by mouth daily. Start after finishing the prednisone taper (Patient not taking: Reported on 03/10/2022)     No current facility-administered medications for this visit.   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; negative for changes in vision HEENT: negative for change in  hearing, tinnitus, voice changes Pulmonary: negative for dyspnea, orthopnea, productive cough, wheezing Cardiac: negative for palpitations, pain Gastrointestinal: negative for nausea, vomiting, constipation, diarrhea, hematemesis, hematochezia Genitourinary/Sexual: negative for dysuria, retention, hematuria, incontinence Ob/Gyn:  negative for abnormal bleeding, or pain Musculoskeletal: negative for pain, joint pain, back pain Hematology: negative for easy bruising, abnormal bleeding Neurologic/Psych: Occasional headaches, negative for seizures, paralysis, weakness, numbness  Objective:  Physical Examination:  BP 135/71   Pulse 75   Temp 98.7 F (37.1 C)   Resp 20   Wt 116 lb 12.8 oz (53 kg)   SpO2 100%   BMI 18.29 kg/m     ECOG Performance Status: 3 - Symptomatic, >50% confined to bed  GENERAL: Frail appearing female in no acute distress; using oxygen and a walker to enter clinic HEENT:  Sclerae anicteric.  Atraumatic normocephalic NODES:  No cervical, supraclavicular, axillary or inguinal lymphadenopathy palpated.  LUNGS:  Clear to auscultation bilaterally.   HEART:  Regular rate and rhythm. Murmur appreciated per patient longstanding. ABDOMEN:  Soft, nontender.  Nondistended and no evidence of ascites or masses.  No organomegaly palpated. EXTREMITIES:  No peripheral edema.   NEURO:  Nonfocal. Well oriented.  Appropriate affect.  Pelvic: Exam Chaperoned by Tyler Deis PA Student Kerney Elbe and RN EGBUS: white epithelium along vaginal introitus 6-9 o'clock but no gross lesions Vagina: no lesions, discharge, or bleeding. Pap collected.  Cervix, Uterus, Ovaries- surgically absent    Lab Review No labs on site today.   Radiologic Imaging: No imaging on site    Assessment:  Autumn [name] is a 76 y.o. female with history of stage 1-A, grade 2 endometrioid ovarian cancer s/p 3 cycles of carbo/taxol (completed 03/23/2010). CA 125 was 677 at time of surgery. Elected to no longer  check ca 125 values after 10 years. Clinically NED.   Multifocal dysplasia of vulva, vagina, perianal/anus in setting of chronic immunosuppression. VAIN III and VIN III status post partial skinning vulvectomy/wide local excision and laser ablation of vaginal lesions  06/2012. Mild dysplasia on the right and left introitus - biopsy 12/23/14 with recurrent VIN 3. Initiated Aldara 01/2015. VIN-3 on biopsy taken 07/14/15. Laser ablation 09/26/2015. She was initiated on cidofovir topically 06/2019; discontinued 12/14/19 due to skin breakdown. Pap 11/21- LSIL, + HR HPV, + type 16. Colpo negative. Resumed Aldara 03/2020, discontinued 06/2020. 02/2021 pap- LSIL, ASCUS, cannot exclude HSIL. NPV not performed. Limited colposcopy. She elected for less aggressive follow up and has transitioned care to [facility]. Pap obtained today.   PAIN & AIN- diagnosed 12/26/17 on perianal biopsy- PAIN3. Follow up gen surg perianal bipsies on 09/30/18 showing pain2. followed by Dr. Laurance Flatten.   Chronic immunosuppression for sarcoidosis  Chronic Respiratory failure with baseline oxygen requirement  Postherpetic neuralgias- stable.   Medical co-morbidities complicating care: sarcoidosis, hypertension, osteoporosis, pulmonary hypertension, anal dysplasia, anal dysplasia.  Plan:   Problem List Items Addressed This Visit       Genitourinary   VIN III (vulvar intraepithelial neoplasia III)   Relevant Orders   IGP, Aptima HPV   Other Visit Diagnoses     Encounter for follow-up surveillance of ovarian cancer    -  Primary   Relevant Orders   IGP, Aptima HPV   VAIN (vaginal intraepithelial neoplasia)       Relevant Orders   IGP, Aptima HPV      We discussed options for management and we recommended continued observation.  We will follow-up on the Pap obtained today.  she will follow-up low up with Dr. Laurance Flatten regarding her perianal dysplasia.   I have also reached out to Dr. Erasmo Leventhal to see why genetic testing was not performed.  I  suspect it may have been due to the fact that she had an endometrioid subtype. We can certainly offer her genetic testing again given the new recommendations from [facility]  Suggested return to clinic in  1 year.    The patient's diagnosis, an outline of the further diagnostic and laboratory studies which will be required, the recommendation for surgery, and alternatives were discussed with her and her accompanying family members.  All questions were answered to their satisfaction.  Beckey Rutter, NP scribed the note  Attestation Statement:  I saw the patient with the student, Dorreen Kathaleen Grinder, and patient during the History and Physical Exam. I verify the findings documented, personally performed critical elevmnts of the Physical Exam, and performed all the Medical Decision Making and counseling.    Trystyn Sitts Gaetana Michaelis, MD

## 2022-03-15 ENCOUNTER — Telehealth: Payer: Self-pay | Admitting: Pulmonary Disease

## 2022-03-15 LAB — IGP, APTIMA HPV: HPV Aptima: POSITIVE — AB

## 2022-03-15 MED ORDER — PREDNISONE 5 MG PO TABS: 5.0000 mg | ORAL_TABLET | Freq: Every day | ORAL | 3 refills | Status: DC

## 2022-03-15 NOTE — Telephone Encounter (Signed)
Called and spoke to patient.  She is requesting refill on prednisone '5mg'$ . Originally prescribed by Dr. Raul Del.   Dr. Patsey Berthold, please advise if okay to order? Thanks

## 2022-03-15 NOTE — Telephone Encounter (Signed)
Okay to refill prednisone 5 mg #30 with 3 refills.  1 tablet daily.

## 2022-03-15 NOTE — Telephone Encounter (Signed)
Prednisone '5mg'$  sent to preferred pharmacy. Patient is aware and voiced her understanding.  Nothing further needed.

## 2022-03-17 ENCOUNTER — Telehealth: Payer: Self-pay

## 2022-03-17 NOTE — Telephone Encounter (Signed)
Called and left voicemail to return call for pap smear results and to discuss having genetic testing performed.

## 2022-03-17 NOTE — Telephone Encounter (Addendum)
Spoke to Ms. Eulas Post. Provided pap smear results. Notified of Dr. Gershon Crane recommendation for genetic testing as this has not been performed. Explained purpose of genetic testing. She is not interested in testing at this time. I will make Dr. Theora Gianotti aware.   She has significant lung disease and had a limited colposcopy. Dr Erasmo Leventhal discussed with her less aggressive monitoring. Plan to repeat HPV/Pap next year. Thank you ANGELES Gaetana Michaelis, MD

## 2022-03-30 ENCOUNTER — Encounter: Payer: Self-pay | Admitting: Student in an Organized Health Care Education/Training Program

## 2022-03-30 ENCOUNTER — Ambulatory Visit
Payer: Medicare PPO | Attending: Student in an Organized Health Care Education/Training Program | Admitting: Student in an Organized Health Care Education/Training Program

## 2022-03-30 VITALS — BP 187/72 | HR 77 | Temp 97.2°F | Resp 16 | Ht 66.0 in | Wt 116.0 lb

## 2022-03-30 DIAGNOSIS — B0229 Other postherpetic nervous system involvement: Secondary | ICD-10-CM | POA: Insufficient documentation

## 2022-03-30 DIAGNOSIS — M47816 Spondylosis without myelopathy or radiculopathy, lumbar region: Secondary | ICD-10-CM | POA: Insufficient documentation

## 2022-03-30 DIAGNOSIS — Z79891 Long term (current) use of opiate analgesic: Secondary | ICD-10-CM | POA: Diagnosis present

## 2022-03-30 DIAGNOSIS — G894 Chronic pain syndrome: Secondary | ICD-10-CM | POA: Insufficient documentation

## 2022-03-30 DIAGNOSIS — M47894 Other spondylosis, thoracic region: Secondary | ICD-10-CM | POA: Insufficient documentation

## 2022-03-30 MED ORDER — HYDROCODONE-ACETAMINOPHEN 10-325 MG PO TABS: 1.0000 | ORAL_TABLET | Freq: Two times a day (BID) | ORAL | 0 refills | Status: AC

## 2022-03-30 MED ORDER — HYDROCODONE-ACETAMINOPHEN 10-325 MG PO TABS: 1.0000 | ORAL_TABLET | Freq: Two times a day (BID) | ORAL | 0 refills | Status: DC

## 2022-03-30 NOTE — Progress Notes (Signed)
PROVIDER NOTE: Information contained herein reflects review and annotations entered in association with encounter. Interpretation of such information and data should be left to medically-trained personnel. Information provided to patient can be located elsewhere in the medical record under "Patient Instructions". Document created using STT-dictation technology, any transcriptional errors that may result from process are unintentional.    Patient: Autumn [name]  Service Category: E/M  Provider: Gillis Santa, MD  DOB: February 22, 1946  DOS: 03/30/2022  Specialty: Interventional Pain Management  MRN: [id number]  Setting: Ambulatory outpatient  PCP: Autumn Lighter, MD  Type: Established Patient    Referring Provider: Gladstone Lighter, MD  Location: Office  Delivery: Face-to-face     HPI  [name], a 76 y.o. year old female, is here today because of her Post herpetic neuralgia [B02.29]. [name] primary complain today is Abdominal Pain (Left side) Last encounter: My last encounter with her was on 11/10/21  Pertinent problems: [name] has COPD (chronic obstructive pulmonary disease) ([place]); Post herpetic neuralgia; Pulmonary sarcoidosis ([place]); Chronic pain syndrome; Thoracic radiculopathy; and Spondylosis without myelopathy or radiculopathy, lumbar region on their pertinent problem list. Severity of Chronic pain is reported as a 8 /10. Location: Back Mid, Left/Radaites from mid left back acrodd rib cage to under left breast area. Onset: More than a month ago. Quality: Constant, Burning, Shooting, Sharp. Timing: Constant. Modifying factor(s): Hydrocodone. Vitals:  height is _0  (1.676 m) and weight is 116 lb (52.6 kg). Her temperature is 97.2 F (36.2 C) (abnormal). Her blood pressure is 187/72 (abnormal) and her pulse is 77. Her respiration is 16 and oxygen saturation is 100%.   Reason for encounter: medication management.    Unfortunately, the patient's lumbar RFA was denied by  insurance.  They stated that it was not a medical necessity. We will attempt resubmission in February which will be 6 months from her denial date. In the interim, we will continue with medication management as below.  No change in her overall regimen. I have also offered her palliative lumbar medial branch nerve blocks until we are able to get her RFA approved.  Pharmacotherapy Assessment  Analgesic Hydrocodone 10 mg BID PRN  Monitoring: [place] PMP: PDMP reviewed during this encounter.       Pharmacotherapy: No side-effects or adverse reactions reported. Compliance: No problems identified. Effectiveness: Clinically acceptable.  [name], RN  03/30/2022 11:39 AM  Sign when Signing Visit Nursing Pain Medication Assessment:  Safety precautions to be maintained throughout the outpatient stay will include: orient to surroundings, keep bed in low position, maintain call bell within reach at all times, provide assistance with transfer out of bed and ambulation.  Medication Inspection Compliance: Pill count conducted under aseptic conditions, in front of the patient. Neither the pills nor the bottle was removed from the patient's sight at any time. Once count was completed pills were immediately returned to the patient in their original bottle.  Medication: Hydrocodone/APAP Pill/Patch Count:  0 of 60 pills remain Pill/Patch Appearance: Markings consistent with prescribed medication Bottle Appearance: Standard pharmacy container. Clearly labeled. Filled Date: 34 / 12 / 2023 Last Medication intake:  Ran out of medicine more than 48 hours ago     UDS:  Summary  Date Value Ref Range Status  05/21/2021 Note  Final    Comment:    ==================================================================== [facility] (MW) ==================================================================== Test  Result       Flag       Units  Drug Present and Declared for Prescription  Verification   Hydrocodone                    777          EXPECTED   ng/mg creat   Hydromorphone                  198          EXPECTED   ng/mg creat   Dihydrocodeine                 245          EXPECTED   ng/mg creat   Norhydrocodone                 3048         EXPECTED   ng/mg creat    Sources of hydrocodone include scheduled prescription medications.    Hydromorphone, dihydrocodeine and norhydrocodone are expected    metabolites of hydrocodone. Hydromorphone and dihydrocodeine are    also available as scheduled prescription medications.  ==================================================================== Test                      Result    Flag   Units      Ref Range   Creatinine              91               mg/dL      >=20 ==================================================================== Declared Medications:  The flagging and interpretation on this report are based on the  following declared medications.  Unexpected results may arise from  inaccuracies in the declared medications.   **Note: The testing scope of this panel includes these medications:   Hydrocodone (Norco)   **Note: The testing scope of this panel does not include the  following reported medications:   Acetaminophen (Tylenol)  Acetaminophen (Norco)  Albuterol (Ventolin HFA)  Albuterol (Duoneb)  Amlodipine (Norvasc)  Atenolol (Tenormin)  Atorvastatin (Lipitor)  Denosumab (Prolia)  Eye Drop  Fluticasone (Flonase)  Fluticasone (Trelegy)  Ipratropium (Duoneb)  Losartan (Cozaar)  Omeprazole (Prilosec)  Polyethylene Glycol (MiraLAX)  Prednisone  Sertraline (Zoloft)  Tizanidine (Zanaflex)  Umeclidinium (Trelegy)  Vilanterol (Trelegy) ==================================================================== For clinical consultation, please call [phone]. ====================================================================      ROS  Constitutional: Denies any fever or chills Gastrointestinal:  No reported hemesis, hematochezia, vomiting, or acute GI distress Musculoskeletal: LBP Neurological: No reported episodes of acute onset apraxia, aphasia, dysarthria, agnosia, amnesia, paralysis, loss of coordination, or loss of consciousness  Medication Review  Fluticasone-Umeclidin-Vilant, HYDROcodone-acetaminophen, Propylene Glycol, Romosozumab-aqqg, acetaminophen, albuterol, amLODipine, atenolol, atorvastatin, cetirizine, fluticasone, ipratropium-albuterol, losartan, mirtazapine, ondansetron, polyethylene glycol powder, predniSONE, and sertraline  History Review  Allergy: Ms. Reicks is allergic to ivp dye [iodinated contrast media]. Drug: Ms. Dombrosky  reports no history of drug use. Alcohol:  reports no history of alcohol use. Tobacco:  reports that she has quit smoking. Her smoking use included cigarettes. She has never used smokeless tobacco. Social: Ms. Mcfadden  reports that she has quit smoking. Her smoking use included cigarettes. She has never used smokeless tobacco. She reports that she does not drink alcohol and does not use drugs. Medical:  has a past medical history of Allergy, Anemia, Arthritis, COPD (chronic obstructive pulmonary disease) ([place]), Glaucoma, H/O uvulectomy, Hypertension, Osteoporosis, Ovarian cancer ([place]), Postherpetic neuralgia, Pulmonary sarcoidosis (  [place]), and Sarcoid. Surgical: [name]  has a past surgical history that includes Abdominal hysterectomy; Dilation and curettage of uterus; Vulvar lesion removal; Diagnostic laparoscopy; Eye surgery; Colonoscopy with propofol (N/A, 07/30/2019); and Breast excisional biopsy (Right). Family: family history includes Breast cancer (age of onset: 75) in her mother; Cancer in her mother; Hypertension in her father.  Laboratory Chemistry Profile   Renal Lab Results  Component Value Date   BUN 29 (H) 06/26/2021   CREATININE 0.79 06/26/2021   GFRAA >60 02/13/2018   GFRNONAA >60 06/26/2021    Hepatic Lab Results  Component  Value Date   AST 25 06/26/2021   ALT 23 06/26/2021   ALBUMIN 4.6 06/26/2021   ALKPHOS 42 06/26/2021   LIPASE 32 06/26/2021    Electrolytes Lab Results  Component Value Date   NA 138 06/26/2021   K 3.7 06/26/2021   CL 94 (L) 06/26/2021   CALCIUM 9.5 06/26/2021   MG 2.7 (H) 02/07/2018   PHOS 3.2 02/07/2018    Bone No results found for: "VD25OH", "VD125OH2TOT", "ZO1096EA5", "WU9811BJ4", "25OHVITD1", "25OHVITD2", "25OHVITD3", "TESTOFREE", "TESTOSTERONE"  Inflammation (CRP: Acute Phase) (ESR: Chronic Phase) Lab Results  Component Value Date   ESRSEDRATE 48 (H) 05/25/2011         Note: Above Lab results reviewed.   Physical Exam  General appearance: Well nourished, well developed, and well hydrated. In no apparent acute distress Mental status: Alert, oriented x 3 (person, place, & time)       Respiratory: No evidence of acute respiratory distress Eyes: PERLA Vitals: BP (!) 187/72   Pulse 77   Temp (!) 97.2 F (36.2 C)   Resp 16   Ht _0  (1.676 m)   Wt 116 lb (52.6 kg)   SpO2 100%   BMI 18.72 kg/m  BMI: Estimated body mass index is 18.72 kg/m as calculated from the following:   Height as of this encounter: _1  (1.676 m).   Weight as of this encounter: 116 lb (52.6 kg). Ideal: Ideal body weight: 59.3 kg (130 lb 11.7 oz)  Lumbar Spine Area Exam  Skin & Axial Inspection: No masses, redness, or swelling Alignment: Symmetrical Functional ROM: Pain restricted ROM       Stability: No instability detected Muscle Tone/Strength: Functionally intact. No obvious neuro-muscular anomalies detected. Sensory (Neurological): Musculoskeletal pain pattern Palpation: No palpable anomalies       Provocative Tests: Hyperextension/rotation test: (+) bilaterally for facet joint pain.  Gait & Posture Assessment  Ambulation: Limited Gait: Antalgic Posture: WNL  Lower Extremity Exam    Side: Right lower extremity  Side: Left lower extremity  Stability: No instability observed           Stability: No instability observed          Skin & Extremity Inspection: Skin color, temperature, and hair growth are WNL. No peripheral edema or cyanosis. No masses, redness, swelling, asymmetry, or associated skin lesions. No contractures.  Skin & Extremity Inspection: Skin color, temperature, and hair growth are WNL. No peripheral edema or cyanosis. No masses, redness, swelling, asymmetry, or associated skin lesions. No contractures.  Functional ROM: Unrestricted ROM                  Functional ROM: Unrestricted ROM                  Muscle Tone/Strength: Functionally intact. No obvious neuro-muscular anomalies detected.  Muscle Tone/Strength: Functionally intact. No obvious neuro-muscular anomalies detected.  Sensory (Neurological): Neurogenic pain  pattern        Sensory (Neurological): Neurogenic pain pattern        DTR: Patellar: deferred today Achilles: deferred today Plantar: deferred today  DTR: Patellar: deferred today Achilles: deferred today Plantar: deferred today  Palpation: No palpable anomalies  Palpation: No palpable anomalies    Assessment   Status Diagnosis  Persistent Persistent Persistent 1. Post herpetic neuralgia   2. Spondylosis without myelopathy or radiculopathy, lumbar region   3. Chronic use of opiate for therapeutic purpose   4. Lumbar facet arthropathy   5. Thoracic facet joint syndrome   6. Chronic pain syndrome        Plan of Care    Ms. EDY MCBANE has a current medication list which includes the following long-term medication(s): amlodipine, atenolol, atorvastatin, fluticasone, ipratropium-albuterol, losartan, and sertraline.  1. Post herpetic neuralgia  2. Spondylosis without myelopathy or radiculopathy, lumbar region - LUMBAR FACET(MEDIAL BRANCH NERVE BLOCK) MBNB; Future  3. Chronic use of opiate for therapeutic purpose  4. Lumbar facet arthropathy - LUMBAR FACET(MEDIAL BRANCH NERVE BLOCK) MBNB; Future  5. Thoracic facet joint  syndrome  6. Chronic pain syndrome - HYDROcodone-acetaminophen (NORCO) 10-325 MG tablet; Take 1 tablet by mouth every 12 (twelve) hours. Must last 30 days.  Dispense: 60 tablet; Refill: 0 - HYDROcodone-acetaminophen (NORCO) 10-325 MG tablet; Take 1 tablet by mouth every 12 (twelve) hours. Must last 30 days.  Dispense: 60 tablet; Refill: 0 - HYDROcodone-acetaminophen (NORCO) 10-325 MG tablet; Take 1 tablet by mouth every 12 (twelve) hours. Must last 30 days.  Dispense: 60 tablet; Refill: 0 - LUMBAR FACET(MEDIAL BRANCH NERVE BLOCK) MBNB; Future    Pharmacotherapy (Medications Ordered): Meds ordered this encounter  Medications   HYDROcodone-acetaminophen (NORCO) 10-325 MG tablet    Sig: Take 1 tablet by mouth every 12 (twelve) hours. Must last 30 days.    Dispense:  60 tablet    Refill:  0    Chronic Pain: STOP Act (Not applicable) Fill 1 day early if closed on refill date. Avoid benzodiazepines within 8 hours of opioids   HYDROcodone-acetaminophen (NORCO) 10-325 MG tablet    Sig: Take 1 tablet by mouth every 12 (twelve) hours. Must last 30 days.    Dispense:  60 tablet    Refill:  0    Chronic Pain: STOP Act (Not applicable) Fill 1 day early if closed on refill date. Avoid benzodiazepines within 8 hours of opioids   HYDROcodone-acetaminophen (NORCO) 10-325 MG tablet    Sig: Take 1 tablet by mouth every 12 (twelve) hours. Must last 30 days.    Dispense:  60 tablet    Refill:  0    Chronic Pain: STOP Act (Not applicable) Fill 1 day early if closed on refill date. Avoid benzodiazepines within 8 hours of opioids   Consider RFA in future if insurance is able to cover.  Follow-up plan:   Return in about 4 weeks (around 04/27/2022) for B/L L3, 4, 5 MBNB , in clinic [facility].    Recent Visits No visits were found meeting these conditions. Showing recent visits within past 90 days and meeting all other requirements Today's Visits Date Type Provider Dept  03/30/22 Office Visit Gillis Santa, MD  [facility]  Showing today's visits and meeting all other requirements Future Appointments Date Type Provider Dept  06/22/22 Appointment Gillis Santa, MD [facility]  Showing future appointments within next 90 days and meeting all other requirements  I discussed the assessment and treatment plan with  the patient. The patient was provided an opportunity to ask questions and all were answered. The patient agreed with the plan and demonstrated an understanding of the instructions.  Patient advised to call back or seek an in-person evaluation if the symptoms or condition worsens.  Duration of encounter: 56mnutes.  Note by: BGillis Santa MD Date: 03/30/2022; Time: 3:11 PM

## 2022-03-30 NOTE — Progress Notes (Signed)
Nursing Pain Medication Assessment:  Safety precautions to be maintained throughout the outpatient stay will include: orient to surroundings, keep bed in low position, maintain call bell within reach at all times, provide assistance with transfer out of bed and ambulation.  Medication Inspection Compliance: Pill count conducted under aseptic conditions, in front of the patient. Neither the pills nor the bottle was removed from the patient's sight at any time. Once count was completed pills were immediately returned to the patient in their original bottle.  Medication: Hydrocodone/APAP Pill/Patch Count:  0 of 60 pills remain Pill/Patch Appearance: Markings consistent with prescribed medication Bottle Appearance: Standard pharmacy container. Clearly labeled. Filled Date: 80 / 12 / 2023 Last Medication intake:  Ran out of medicine more than 48 hours ago

## 2022-03-31 ENCOUNTER — Telehealth: Payer: Self-pay | Admitting: Pulmonary Disease

## 2022-03-31 MED ORDER — AZITHROMYCIN 250 MG PO TABS: ORAL_TABLET | ORAL | 0 refills | Status: AC

## 2022-03-31 MED ORDER — METHYLPREDNISOLONE 4 MG PO TBPK: ORAL_TABLET | ORAL | 0 refills | Status: DC

## 2022-03-31 NOTE — Telephone Encounter (Signed)
Okay to call in Medrol Dosepak and Azithromycin Z-Pak.  If symptoms worsen she should go to the emergency room.

## 2022-03-31 NOTE — Telephone Encounter (Signed)
Medrol and zpak has been sent to preferred pharmacy. Patient is aware and voiced her understanding. She is aware to hold prednisone while taking medrol. Nothing further needed.

## 2022-03-31 NOTE — Telephone Encounter (Signed)
Called and spoke to patient.  C/o dry cough, wheezing, increased SOB with exertion Denied f/c/s or additional sx. No recent flu or covid test. She does not have a covid test at home. She is using albuterol HFA BID, Duoneb BID and Trelegy once daily. She wears 2L cont. She has not been monitoring oxygen levels.    Dr. Patsey Berthold, please advise. Thanks

## 2022-04-01 ENCOUNTER — Telehealth: Payer: Self-pay | Admitting: Student in an Organized Health Care Education/Training Program

## 2022-04-01 NOTE — Telephone Encounter (Signed)
Re faxed to [facility].

## 2022-04-01 NOTE — Telephone Encounter (Signed)
Faxed [facility] (where the prescriptions were sent) the copy of the [facility] regulations regarding transferring prescriptions.

## 2022-04-01 NOTE — Telephone Encounter (Signed)
PT stated that she called [facility] and was told that they can't transfer her prescription. Please give patient a call.

## 2022-05-03 ENCOUNTER — Other Ambulatory Visit: Payer: Self-pay | Admitting: Internal Medicine

## 2022-05-03 DIAGNOSIS — G8929 Other chronic pain: Secondary | ICD-10-CM

## 2022-05-04 ENCOUNTER — Encounter: Payer: Self-pay | Admitting: Pulmonary Disease

## 2022-05-12 ENCOUNTER — Ambulatory Visit
Admission: RE | Admit: 2022-05-12 | Discharge: 2022-05-12 | Disposition: A | Discharge status: Home / Self Care | Payer: Medicare PPO | Source: Ambulatory Visit | Attending: Student in an Organized Health Care Education/Training Program | Admitting: Student in an Organized Health Care Education/Training Program

## 2022-05-12 ENCOUNTER — Ambulatory Visit
Payer: Medicare PPO | Attending: Student in an Organized Health Care Education/Training Program | Admitting: Student in an Organized Health Care Education/Training Program

## 2022-05-12 ENCOUNTER — Encounter: Payer: Self-pay | Admitting: Student in an Organized Health Care Education/Training Program

## 2022-05-12 DIAGNOSIS — M47816 Spondylosis without myelopathy or radiculopathy, lumbar region: Secondary | ICD-10-CM | POA: Insufficient documentation

## 2022-05-12 DIAGNOSIS — G894 Chronic pain syndrome: Secondary | ICD-10-CM

## 2022-05-12 MED ORDER — LIDOCAINE HCL 2 % IJ SOLN
INTRAMUSCULAR | Status: AC
  Filled 2022-05-12: qty 20

## 2022-05-12 MED ORDER — ROPIVACAINE HCL 2 MG/ML IJ SOLN
9.0000 mL | Freq: Once | INTRAMUSCULAR | Status: AC
  Administered 2022-05-12: 9 mL via PERINEURAL

## 2022-05-12 MED ORDER — DEXAMETHASONE SODIUM PHOSPHATE 10 MG/ML IJ SOLN: 10.0000 mg | Freq: Once | INTRAMUSCULAR | Status: DC

## 2022-05-12 MED ORDER — LIDOCAINE HCL 2 % IJ SOLN
20.0000 mL | Freq: Once | INTRAMUSCULAR | Status: AC
  Administered 2022-05-12: 400 mg

## 2022-05-12 MED ORDER — DEXAMETHASONE SODIUM PHOSPHATE 10 MG/ML IJ SOLN
INTRAMUSCULAR | Status: AC
  Filled 2022-05-12: qty 2

## 2022-05-12 MED ORDER — DEXAMETHASONE SODIUM PHOSPHATE 10 MG/ML IJ SOLN
10.0000 mg | Freq: Once | INTRAMUSCULAR | Status: AC
  Administered 2022-05-12: 10 mg

## 2022-05-12 MED ORDER — ROPIVACAINE HCL 2 MG/ML IJ SOLN
INTRAMUSCULAR | Status: AC
  Filled 2022-05-12: qty 20

## 2022-05-12 NOTE — Patient Instructions (Signed)

## 2022-05-12 NOTE — Progress Notes (Signed)
PROVIDER NOTE: Interpretation of information contained herein should be left to medically-trained personnel. Specific patient instructions are provided elsewhere under "Patient Instructions" section of medical record. This document was created in part using STT-dictation technology, any transcriptional errors that may result from this process are unintentional.  Patient: Autumn [name] Type: Established DOB: 1945-11-11 MRN: [id number] PCP: Gladstone Lighter, MD  Service: Procedure DOS: 05/12/2022 Setting: Ambulatory Location: Ambulatory outpatient facility Delivery: Face-to-face Provider: Gillis Santa, MD Specialty: Interventional Pain Management Specialty designation: 09 Location: Outpatient facility Ref. Prov.: Gillis Santa, MD   Interventional Therapy     Procedure:           Type: Lumbar Facet, Medial Branch Block(s) #1  Laterality: Bilateral  Level: L3, L4, and L5 Medial Branch Level(s). Injecting these levels blocks the L3-4 and L4-5 lumbar facet joints. Imaging: Fluoroscopic guidance         Anesthesia: Local anesthesia (1-2% Lidocaine) DOS: 05/12/2022 Performed by: Gillis Santa, MD  Primary Purpose: Diagnostic/Therapeutic Indications: Low back pain severe enough to impact quality of life or function. 1. Spondylosis without myelopathy or radiculopathy, lumbar region   2. Lumbar facet arthropathy   3. Chronic pain syndrome    NAS-11 Pain score:   Pre-procedure: 6 /10   Post-procedure: 0-No pain/10     Position / Prep / Materials:  Position: Prone  Prep solution: DuraPrep (Iodine Povacrylex [0.7% available iodine] and Isopropyl Alcohol, 74% w/w) Area Prepped: Posterolateral Lumbosacral Spine (Wide prep: From the lower border of the scapula down to the end of the tailbone and from flank to flank.)  Materials:  Tray: Block Needle(s):  Type: Spinal  Gauge (G): 22  Length: 3.5-in Qty: 2      Pre-op H&P Assessment:  Autumn [name] is a 77 y.o. (year old), female patient,  seen today for interventional treatment. She  has a past surgical history that includes Abdominal hysterectomy; Dilation and curettage of uterus; Vulvar lesion removal; Diagnostic laparoscopy; Eye surgery; Colonoscopy with propofol (N/A, 07/30/2019); and Breast excisional biopsy (Right). Autumn [name] has a current medication list which includes the following prescription(s): acetaminophen, albuterol, amlodipine, atenolol, atorvastatin, cetirizine, fluticasone, trelegy ellipta, trelegy ellipta, hydrocodone-acetaminophen, [START ON 05/29/2022] hydrocodone-acetaminophen, ipratropium-albuterol, losartan, methylprednisolone, mirtazapine, ondansetron, polyethylene glycol powder, prednisone, propylene glycol, romosozumab-aqqg, and sertraline, and the following Facility-Administered Medications: dexamethasone. Her primarily concern today is the Back Pain (lower)  Initial Vital Signs:  Pulse/HCG Rate: 70ECG Heart Rate: 79 Temp: (!) 97.4 F (36.3 C) Resp: 16 BP: (!) 144/61 SpO2: 100 % (O2 at 2L BNC)  BMI: Estimated body mass index is 17.92 kg/m as calculated from the following:   Height as of this encounter: '5\' 6"'$  (1.676 m).   Weight as of this encounter: 111 lb (50.3 kg).  Risk Assessment: Allergies: Reviewed. She is allergic to ivp dye [iodinated contrast media].  Allergy Precautions: None required Coagulopathies: Reviewed. None identified.  Blood-thinner therapy: None at this time Active Infection(s): Reviewed. None identified. Autumn [name] is afebrile  Site Confirmation: Autumn [name] was asked to confirm the procedure and laterality before marking the site Procedure checklist: Completed Consent: Before the procedure and under the influence of no sedative(s), amnesic(s), or anxiolytics, the patient was informed of the treatment options, risks and possible complications. To fulfill our ethical and legal obligations, as recommended by the American Medical Association's Code of Ethics, I have informed the  patient of my clinical impression; the nature and purpose of the treatment or procedure; the risks, benefits, and possible complications of the intervention; the alternatives,  including doing nothing; the risk(s) and benefit(s) of the alternative treatment(s) or procedure(s); and the risk(s) and benefit(s) of doing nothing. The patient was provided information about the general risks and possible complications associated with the procedure. These may include, but are not limited to: failure to achieve desired goals, infection, bleeding, organ or nerve damage, allergic reactions, paralysis, and death. In addition, the patient was informed of those risks and complications associated to Spine-related procedures, such as failure to decrease pain; infection (i.e.: Meningitis, epidural or intraspinal abscess); bleeding (i.e.: epidural hematoma, subarachnoid hemorrhage, or any other type of intraspinal or peri-dural bleeding); organ or nerve damage (i.e.: Any type of peripheral nerve, nerve root, or spinal cord injury) with subsequent damage to sensory, motor, and/or autonomic systems, resulting in permanent pain, numbness, and/or weakness of one or several areas of the body; allergic reactions; (i.e.: anaphylactic reaction); and/or death. Furthermore, the patient was informed of those risks and complications associated with the medications. These include, but are not limited to: allergic reactions (i.e.: anaphylactic or anaphylactoid reaction(s)); adrenal axis suppression; blood sugar elevation that in diabetics may result in ketoacidosis or comma; water retention that in patients with history of congestive heart failure may result in shortness of breath, pulmonary edema, and decompensation with resultant heart failure; weight gain; swelling or edema; medication-induced neural toxicity; particulate matter embolism and blood vessel occlusion with resultant organ, and/or nervous system infarction; and/or aseptic necrosis  of one or more joints. Finally, the patient was informed that Medicine is not an exact science; therefore, there is also the possibility of unforeseen or unpredictable risks and/or possible complications that may result in a catastrophic outcome. The patient indicated having understood very clearly. We have given the patient no guarantees and we have made no promises. Enough time was given to the patient to ask questions, all of which were answered to the patient's satisfaction. Autumn [name] has indicated that she wanted to continue with the procedure. Attestation: I, the ordering provider, attest that I have discussed with the patient the benefits, risks, side-effects, alternatives, likelihood of achieving goals, and potential problems during recovery for the procedure that I have provided informed consent. Date  Time: 05/12/2022 10:54 AM   Pre-Procedure Preparation:  Monitoring: As per clinic protocol. Respiration, ETCO2, SpO2, BP, heart rate and rhythm monitor placed and checked for adequate function Safety Precautions: Patient was assessed for positional comfort and pressure points before starting the procedure. Time-out: I initiated and conducted the "Time-out" before starting the procedure, as per protocol. The patient was asked to participate by confirming the accuracy of the "Time Out" information. Verification of the correct person, site, and procedure were performed and confirmed by me, the nursing staff, and the patient. "Time-out" conducted as per Joint Commission's Universal Protocol (UP.01.01.01). Time: 1138  Description of Procedure:          Laterality: (see above) Targeted Levels: (see above)  Safety Precautions: Aspiration looking for blood return was conducted prior to all injections. At no point did we inject any substances, as a needle was being advanced. Before injecting, the patient was told to immediately notify me if she was experiencing any new onset of "ringing in the ears,  or metallic taste in the mouth". No attempts were made at seeking any paresthesias. Safe injection practices and needle disposal techniques used. Medications properly checked for expiration dates. SDV (single dose vial) medications used. After the completion of the procedure, all disposable equipment used was discarded in the proper designated medical waste containers.  Local Anesthesia: Protocol guidelines were followed. The patient was positioned over the fluoroscopy table. The area was prepped in the usual manner. The time-out was completed. The target area was identified using fluoroscopy. A 12-in long, straight, sterile hemostat was used with fluoroscopic guidance to locate the targets for each level blocked. Once located, the skin was marked with an approved surgical skin marker. Once all sites were marked, the skin (epidermis, dermis, and hypodermis), as well as deeper tissues (fat, connective tissue and muscle) were infiltrated with a small amount of a short-acting local anesthetic, loaded on a 10cc syringe with a 25G, 1.5-in  Needle. An appropriate amount of time was allowed for local anesthetics to take effect before proceeding to the next step. Local Anesthetic: Lidocaine 2.0% The unused portion of the local anesthetic was discarded in the proper designated containers. Technical description of process:  L3 Medial Branch Nerve Block (MBB): The target area for the L3 medial branch is at the junction of the postero-lateral aspect of the superior articular process and the superior, posterior, and medial edge of the transverse process of L4. Under fluoroscopic guidance, a Quincke needle was inserted until contact was made with os over the superior postero-lateral aspect of the pedicular shadow (target area). After negative aspiration for blood, 68m of the nerve block solution was injected without difficulty or complication. The needle was removed intact. L4 Medial Branch Nerve Block (MBB): The target area  for the L4 medial branch is at the junction of the postero-lateral aspect of the superior articular process and the superior, posterior, and medial edge of the transverse process of L5. Under fluoroscopic guidance, a Quincke needle was inserted until contact was made with os over the superior postero-lateral aspect of the pedicular shadow (target area). After negative aspiration for blood, 251mof the nerve block solution was injected without difficulty or complication. The needle was removed intact. L5 Medial Branch Nerve Block (MBB): The target area for the L5 medial branch is at the junction of the postero-lateral aspect of the superior articular process and the superior, posterior, and medial edge of the sacral ala. Under fluoroscopic guidance, a Quincke needle was inserted until contact was made with os over the superior postero-lateral aspect of the pedicular shadow (target area). After negative aspiration for blood, 23m67mf the nerve block solution was injected without difficulty or complication. The needle was removed intact.   Once the entire procedure was completed, the treated area was cleaned, making sure to leave some of the prepping solution back to take advantage of its long term bactericidal properties.         Illustration of the posterior view of the lumbar spine and the posterior neural structures. Laminae of L2 through S1 are labeled. DPRL5, dorsal primary ramus of L5; DPRS1, dorsal primary ramus of S1; DPR3, dorsal primary ramus of L3; FJ, facet (zygapophyseal) joint L3-L4; I, inferior articular process of L4; LB1, lateral branch of dorsal primary ramus of L1; IAB, inferior articular branches from L3 medial branch (supplies L4-L5 facet joint); IBP, intermediate branch plexus; MB3, medial branch of dorsal primary ramus of L3; NR3, third lumbar nerve root; S, superior articular process of L5; SAB, superior articular branches from L4 (supplies L4-5 facet joint also); TP3, transverse process  of L3.  Vitals:   05/12/22 1133 05/12/22 1139 05/12/22 1143 05/12/22 1147  BP: (!) 182/77 (!) 181/77 (!) 176/83 (!) 176/83  Pulse:      Resp: (!) 21 (!) 24 (!) 21 (!) 22  Temp:      TempSrc:      SpO2: 100% 100% 100% 100%  Weight:      Height:         Start Time: 1138 hrs. End Time: 1147 hrs.  Imaging Guidance (Spinal):          Type of Imaging Technique: Fluoroscopy Guidance (Spinal) Indication(s): Assistance in needle guidance and placement for procedures requiring needle placement in or near specific anatomical locations not easily accessible without such assistance. Exposure Time: Please see nurses notes. Contrast: None used. Fluoroscopic Guidance: I was personally present during the use of fluoroscopy. "Tunnel Vision Technique" used to obtain the best possible view of the target area. Parallax error corrected before commencing the procedure. "Direction-depth-direction" technique used to introduce the needle under continuous pulsed fluoroscopy. Once target was reached, antero-posterior, oblique, and lateral fluoroscopic projection used confirm needle placement in all planes. Images permanently stored in EMR. Interpretation: No contrast injected. I personally interpreted the imaging intraoperatively. Adequate needle placement confirmed in multiple planes. Permanent images saved into the patient's record.  Post-operative Assessment:  Post-procedure Vital Signs:  Pulse/HCG Rate: 7076 Temp: (!) 97.4 F (36.3 C) Resp: (!) 22 BP: (!) 176/83 SpO2: 100 %  EBL: None  Complications: No immediate post-treatment complications observed by team, or reported by patient.  Note: The patient tolerated the entire procedure well. A repeat set of vitals were taken after the procedure and the patient was kept under observation following institutional policy, for this type of procedure. Post-procedural neurological assessment was performed, showing return to baseline, prior to discharge. The  patient was provided with post-procedure discharge instructions, including a section on how to identify potential problems. Should any problems arise concerning this procedure, the patient was given instructions to immediately contact us, at any time, without hesitation. In any case, we plan to contact the patient by telephone for a follow-up status report regarding this interventional procedure.  Comments:  No additional relevant information.  Plan of Care  Orders:  Orders Placed This Encounter  Procedures   DG PA[facility] C-ARM 1-60 MIN NO REPORT    Intraoperative interpretation by procedural physician at [place].    Standing Status:   Standing    Number of Occurrences:   1    Order Specific Question:   Reason for exam:    Answer:   Assistance in needle guidance and placement for procedures requiring needle placement in or near specific anatomical locations not easily accessible without such assistance.      Medications ordered for procedure: Meds ordered this encounter  Medications   lidocaine (XYLOCAINE) 2 % (with pres) injection 400 mg   dexamethasone (DECADRON) injection 10 mg   ropivacaine (PF) 2 mg/mL (0.2%) (NAROPIN) injection 9 mL   ropivacaine (PF) 2 mg/mL (0.2%) (NAROPIN) injection 9 mL   dexamethasone (DECADRON) injection 10 mg   Medications administered: We administered lidocaine, dexamethasone, ropivacaine (PF) 2 mg/mL (0.2%), and ropivacaine (PF) 2 mg/mL (0.2%).  See the medical record for exact dosing, route, and time of administration.  Follow-up plan:   Return keep scheduled appt..    Recent Visits Date Type Provider Dept  03/30/22 Office Visit Gillis Santa, MD [facility]  Showing recent visits within past 90 days and meeting all other requirements Today's Visits Date Type Provider Dept  05/12/22 Procedure visit Gillis Santa, MD [facility]  Showing today's visits and meeting all other requirements Future  Appointments Date Type Provider Dept  06/22/22 Appointment Holley Raring,  Carlus Pavlov, MD [facility]  Showing future appointments within next 90 days and meeting all other requirements  Disposition: Discharge home  Discharge (Date  Time): 05/12/2022; 1154 hrs.   Primary Care Physician: Gladstone Lighter, MD Location: [facility] Note by: Gillis Santa, MD Date: 05/12/2022; Time: 1:53 PM  Disclaimer:  Medicine is not an exact science. The only guarantee in medicine is that nothing is guaranteed. It is important to note that the decision to proceed with this intervention was based on the information collected from the patient. The Data and conclusions were drawn from the patient's questionnaire, the interview, and the physical examination. Because the information was provided in large part by the patient, it cannot be guaranteed that it has not been purposely or unconsciously manipulated. Every effort has been made to obtain as much relevant data as possible for this evaluation. It is important to note that the conclusions that lead to this procedure are derived in large part from the available data. Always take into account that the treatment will also be dependent on availability of resources and existing treatment guidelines, considered by other Pain Management Practitioners as being common knowledge and practice, at the time of the intervention. For Medico-Legal purposes, it is also important to point out that variation in procedural techniques and pharmacological choices are the acceptable norm. The indications, contraindications, technique, and results of the above procedure should only be interpreted and judged by a Board-Certified Interventional Pain Specialist with extensive familiarity and expertise in the same exact procedure and technique.

## 2022-05-13 ENCOUNTER — Telehealth: Payer: Self-pay

## 2022-05-13 NOTE — Telephone Encounter (Signed)
Pot procedure follow up. LM

## 2022-05-14 ENCOUNTER — Ambulatory Visit
Admission: RE | Admit: 2022-05-14 | Discharge: 2022-05-14 | Disposition: A | Discharge status: Home / Self Care | Payer: Medicare PPO | Source: Ambulatory Visit | Attending: Internal Medicine | Admitting: Internal Medicine

## 2022-05-14 DIAGNOSIS — M25552 Pain in left hip: Secondary | ICD-10-CM | POA: Insufficient documentation

## 2022-05-14 DIAGNOSIS — G8929 Other chronic pain: Secondary | ICD-10-CM | POA: Insufficient documentation

## 2022-06-18 ENCOUNTER — Encounter: Payer: Self-pay | Admitting: Pulmonary Disease

## 2022-06-22 ENCOUNTER — Ambulatory Visit
Payer: Medicare PPO | Attending: Student in an Organized Health Care Education/Training Program | Admitting: Student in an Organized Health Care Education/Training Program

## 2022-06-22 ENCOUNTER — Encounter: Payer: Self-pay | Admitting: Student in an Organized Health Care Education/Training Program

## 2022-06-22 VITALS — BP 160/64 | HR 68 | Temp 97.4°F | Ht 67.0 in | Wt 110.0 lb

## 2022-06-22 DIAGNOSIS — M47816 Spondylosis without myelopathy or radiculopathy, lumbar region: Secondary | ICD-10-CM | POA: Insufficient documentation

## 2022-06-22 DIAGNOSIS — M25552 Pain in left hip: Secondary | ICD-10-CM | POA: Diagnosis not present

## 2022-06-22 DIAGNOSIS — G894 Chronic pain syndrome: Secondary | ICD-10-CM | POA: Insufficient documentation

## 2022-06-22 DIAGNOSIS — M1612 Unilateral primary osteoarthritis, left hip: Secondary | ICD-10-CM | POA: Diagnosis not present

## 2022-06-22 MED ORDER — HYDROCODONE-ACETAMINOPHEN 10-325 MG PO TABS: 1.0000 | ORAL_TABLET | Freq: Two times a day (BID) | ORAL | 0 refills | Status: AC

## 2022-06-22 MED ORDER — HYDROCODONE-ACETAMINOPHEN 10-325 MG PO TABS: 1.0000 | ORAL_TABLET | Freq: Two times a day (BID) | ORAL | 0 refills | Status: DC

## 2022-06-22 NOTE — Progress Notes (Signed)
PROVIDER NOTE: Information contained herein reflects review and annotations entered in association with encounter. Interpretation of such information and data should be left to medically-trained personnel. Information provided to patient can be located elsewhere in the medical record under "Patient Instructions". Document created using STT-dictation technology, any transcriptional errors that may result from process are unintentional.    Patient: Autumn [name]  Service Category: E/M  Provider: Gillis Santa, MD  DOB: Feb 27, 1946  DOS: 06/22/2022  Specialty: Interventional Pain Management  MRN: [id number]  Setting: Ambulatory outpatient  PCP: Gladstone Lighter, MD  Type: Established Patient    Referring Provider: Gladstone Lighter, MD  Location: Office  Delivery: Face-to-face     HPI  Autumn [name], a 77 y.o. year old female, is here today because of her Primary osteoarthritis of left hip [M16.12]. Autumn [name] primary complain today is Back Pain (lower) Last encounter: My last encounter with her was on 11/10/21  Pertinent problems: Ms. Okabe has COPD (chronic obstructive pulmonary disease) ([place]); Post herpetic neuralgia; Pulmonary sarcoidosis ([place]); Chronic pain syndrome; Thoracic radiculopathy; and Spondylosis without myelopathy or radiculopathy, lumbar region on their pertinent problem list. Severity of Chronic pain is reported as a 8 /10. Location: Back Mid, Left/Radaites from mid left back acrodd rib cage to under left breast area. Onset: More than a month ago. Quality: Constant, Burning, Shooting, Sharp. Timing: Constant. Modifying factor(s): Hydrocodone. Vitals:  height is '5\' 7"'$  (1.702 m) and weight is 110 lb (49.9 kg). Her temperature is 97.4 F (36.3 C) (abnormal). Her blood pressure is 160/64 (abnormal) and her pulse is 68. Her oxygen saturation is 100%.   Reason for encounter: medication management.    Patient presents today with left lower back pain and left hip pain.  Unfortunately  her lumbar facet medial branch nerve blocks did not provide pain relief beyond 4 days.  She did have an MRI done of her left hip which shows osteoarthritis of her left hip along with a tear of her superior anterior labrum.  She does endorse pain with weightbearing and ambulates with a walker.  We discussed a left intra-articular hip steroid injection under fluoroscopy for her left hip pain and the patient is interested in proceeding with that.  Pharmacotherapy Assessment  Analgesic Hydrocodone 10 mg BID PRN  Monitoring: [place] PMP: PDMP reviewed during this encounter.       Pharmacotherapy: No side-effects or adverse reactions reported. Compliance: No problems identified. Effectiveness: Clinically acceptable.  Chauncey Fischer, RN  06/22/2022  3:08 PM  Sign when Signing Visit Nursing Pain Medication Assessment:  Safety precautions to be maintained throughout the outpatient stay will include: orient to surroundings, keep bed in low position, maintain call bell within reach at all times, provide assistance with transfer out of bed and ambulation.  Medication Inspection Compliance: Pill count conducted under aseptic conditions, in front of the patient. Neither the pills nor the bottle was removed from the patient's sight at any time. Once count was completed pills were immediately returned to the patient in their original bottle.  Medication: Hydrocodone/APAP Pill/Patch Count:  23 of 60 pills remain Pill/Patch Appearance: Markings consistent with prescribed medication Bottle Appearance: Standard pharmacy container. Clearly labeled. Filled Date: 2 / 20 / 2024 Last Medication intake:  TodaySafety precautions to be maintained throughout the outpatient stay will include: orient to surroundings, keep bed in low position, maintain call bell within reach at all times, provide assistance with transfer out of bed and ambulation.      UDS:  Summary  Date Value Ref Range Status  05/21/2021 Note  Final     Comment:    ==================================================================== [employer] Select 13 (MW) ==================================================================== Test                             Result       Flag       Units  Drug Present and Declared for Prescription Verification   Hydrocodone                    777          EXPECTED   ng/mg creat   Hydromorphone                  198          EXPECTED   ng/mg creat   Dihydrocodeine                 245          EXPECTED   ng/mg creat   Norhydrocodone                 3048         EXPECTED   ng/mg creat    Sources of hydrocodone include scheduled prescription medications.    Hydromorphone, dihydrocodeine and norhydrocodone are expected    metabolites of hydrocodone. Hydromorphone and dihydrocodeine are    also available as scheduled prescription medications.  ==================================================================== Test                      Result    Flag   Units      Ref Range   Creatinine              91               mg/dL      >=20 ==================================================================== Declared Medications:  The flagging and interpretation on this report are based on the  following declared medications.  Unexpected results may arise from  inaccuracies in the declared medications.   **Note: The testing scope of this panel includes these medications:   Hydrocodone (Norco)   **Note: The testing scope of this panel does not include the  following reported medications:   Acetaminophen (Tylenol)  Acetaminophen (Norco)  Albuterol (Ventolin HFA)  Albuterol (Duoneb)  Amlodipine (Norvasc)  Atenolol (Tenormin)  Atorvastatin (Lipitor)  Denosumab (Prolia)  Eye Drop  Fluticasone (Flonase)  Fluticasone (Trelegy)  Ipratropium (Duoneb)  Losartan (Cozaar)  Omeprazole (Prilosec)  Polyethylene Glycol (MiraLAX)  Prednisone  Sertraline (Zoloft)  Tizanidine (Zanaflex)  Umeclidinium (Trelegy)   Vilanterol (Trelegy) ==================================================================== For clinical consultation, please call [phone]. ====================================================================      ROS  Constitutional: Denies any fever or chills Gastrointestinal: No reported hemesis, hematochezia, vomiting, or acute GI distress Musculoskeletal: LBP, left hip pain Neurological: No reported episodes of acute onset apraxia, aphasia, dysarthria, agnosia, amnesia, paralysis, loss of coordination, or loss of consciousness  Medication Review  Fluticasone-Umeclidin-Vilant, HYDROcodone-acetaminophen, Propylene Glycol, Romosozumab-aqqg, acetaminophen, albuterol, amLODipine, atenolol, atorvastatin, cetirizine, fluticasone, ipratropium-albuterol, losartan, methylPREDNISolone, mirtazapine, ondansetron, polyethylene glycol powder, predniSONE, and sertraline  History Review  Allergy: Ms. Hosbach is allergic to ivp dye [iodinated contrast media]. Drug: Ms. Knibbs  reports no history of drug use. Alcohol:  reports no history of alcohol use. Tobacco:  reports that she has quit smoking. Her smoking use included cigarettes. She has never used smokeless tobacco. Social: Ms. Poertner  reports  that she has quit smoking. Her smoking use included cigarettes. She has never used smokeless tobacco. She reports that she does not drink alcohol and does not use drugs. Medical:  has a past medical history of Allergy, Anemia, Arthritis, COPD (chronic obstructive pulmonary disease) ([place]), Glaucoma, H/O uvulectomy, Hypertension, Osteoporosis, Ovarian cancer ([place]), Postherpetic neuralgia, Pulmonary sarcoidosis ([place]), and Sarcoid. Surgical: Ms. Aubut  has a past surgical history that includes Abdominal hysterectomy; Dilation and curettage of uterus; Vulvar lesion removal; Diagnostic laparoscopy; Eye surgery; Colonoscopy with propofol (N/A, 07/30/2019); and Breast excisional biopsy (Right). Family: family  history includes Breast cancer (age of onset: 63) in her mother; Cancer in her mother; Hypertension in her father.  Laboratory Chemistry Profile   Renal Lab Results  Component Value Date   BUN 29 (H) 06/26/2021   CREATININE 0.79 06/26/2021   GFRAA >60 02/13/2018   GFRNONAA >60 06/26/2021    Hepatic Lab Results  Component Value Date   AST 25 06/26/2021   ALT 23 06/26/2021   ALBUMIN 4.6 06/26/2021   ALKPHOS 42 06/26/2021   LIPASE 32 06/26/2021    Electrolytes Lab Results  Component Value Date   NA 138 06/26/2021   K 3.7 06/26/2021   CL 94 (L) 06/26/2021   CALCIUM 9.5 06/26/2021   MG 2.7 (H) 02/07/2018   PHOS 3.2 02/07/2018    Bone No results found for: "VD25OH", "VD125OH2TOT", "[id number]", "[id number]", "25OHVITD1", "25OHVITD2", "25OHVITD3", "TESTOFREE", "TESTOSTERONE"  Inflammation (CRP: Acute Phase) (ESR: Chronic Phase) Lab Results  Component Value Date   ESRSEDRATE 48 (H) 05/25/2011         Note: Above Lab results reviewed.   Physical Exam  General appearance: Well nourished, well developed, and well hydrated. In no apparent acute distress Mental status: Alert, oriented x 3 (person, place, & time)       Respiratory: No evidence of acute respiratory distress Eyes: PERLA Vitals: BP (!) 160/64   Pulse 68   Temp (!) 97.4 F (36.3 C)   Ht '5\' 7"'$  (1.702 m)   Wt 110 lb (49.9 kg)   SpO2 100% Comment: 2 liters  BMI 17.23 kg/m  BMI: Estimated body mass index is 17.23 kg/m as calculated from the following:   Height as of this encounter: '5\' 7"'$  (1.702 m).   Weight as of this encounter: 110 lb (49.9 kg). Ideal: Ideal body weight: 61.6 kg (135 lb 12.9 oz)  Lumbar Spine Area Exam  Skin & Axial Inspection: No masses, redness, or swelling Alignment: Symmetrical Functional ROM: Pain restricted ROM       Stability: No instability detected Muscle Tone/Strength: Functionally intact. No obvious neuro-muscular anomalies detected. Sensory (Neurological): Musculoskeletal pain  pattern Palpation: No palpable anomalies       Provocative Tests: Hyperextension/rotation test: (+) bilaterally for facet joint pain. Left hip pain, worse with weightbearing Gait & Posture Assessment  Ambulation: Limited Gait: Antalgic Posture: WNL  Lower Extremity Exam    Side: Right lower extremity  Side: Left lower extremity  Stability: No instability observed          Stability: No instability observed          Skin & Extremity Inspection: Skin color, temperature, and hair growth are WNL. No peripheral edema or cyanosis. No masses, redness, swelling, asymmetry, or associated skin lesions. No contractures.  Skin & Extremity Inspection: Skin color, temperature, and hair growth are WNL. No peripheral edema or cyanosis. No masses, redness, swelling, asymmetry, or associated skin lesions. No contractures.  Functional ROM: Unrestricted ROM  Functional ROM: Pain restricted ROM for hip joint          Muscle Tone/Strength: Functionally intact. No obvious neuro-muscular anomalies detected.  Muscle Tone/Strength: Functionally intact. No obvious neuro-muscular anomalies detected.  Sensory (Neurological): Neurogenic pain pattern        Sensory (Neurological): Musculoskeletal pain pattern        DTR: Patellar: deferred today Achilles: deferred today Plantar: deferred today  DTR: Patellar: deferred today Achilles: deferred today Plantar: deferred today  Palpation: No palpable anomalies  Palpation: No palpable anomalies    Assessment   Status Diagnosis  Persistent Persistent Persistent 1. Primary osteoarthritis of left hip   2. Left hip pain   3. Lumbar facet arthropathy   4. Chronic pain syndrome        Plan of Care    Ms. KYLIANNE SALMAN has a current medication list which includes the following long-term medication(s): amlodipine, atenolol, atorvastatin, fluticasone, ipratropium-albuterol, losartan, and sertraline.  1. Primary osteoarthritis of left hip - HIP  INJECTION; Future  2. Left hip pain - HIP INJECTION; Future  3. Lumbar facet arthropathy - HYDROcodone-acetaminophen (NORCO) 10-325 MG tablet; Take 1 tablet by mouth every 12 (twelve) hours. Must last 30 days.  Dispense: 60 tablet; Refill: 0 - HYDROcodone-acetaminophen (NORCO) 10-325 MG tablet; Take 1 tablet by mouth every 12 (twelve) hours. Must last 30 days.  Dispense: 60 tablet; Refill: 0 - HYDROcodone-acetaminophen (NORCO) 10-325 MG tablet; Take 1 tablet by mouth every 12 (twelve) hours. Must last 30 days.  Dispense: 60 tablet; Refill: 0 - HIP INJECTION; Future  4. Chronic pain syndrome - HYDROcodone-acetaminophen (NORCO) 10-325 MG tablet; Take 1 tablet by mouth every 12 (twelve) hours. Must last 30 days.  Dispense: 60 tablet; Refill: 0 - HYDROcodone-acetaminophen (NORCO) 10-325 MG tablet; Take 1 tablet by mouth every 12 (twelve) hours. Must last 30 days.  Dispense: 60 tablet; Refill: 0 - HYDROcodone-acetaminophen (NORCO) 10-325 MG tablet; Take 1 tablet by mouth every 12 (twelve) hours. Must last 30 days.  Dispense: 60 tablet; Refill: 0 - HIP INJECTION; Future - ToxASSURE Select 13 (MW), Urine    Pharmacotherapy (Medications Ordered): Meds ordered this encounter  Medications   HYDROcodone-acetaminophen (NORCO) 10-325 MG tablet    Sig: Take 1 tablet by mouth every 12 (twelve) hours. Must last 30 days.    Dispense:  60 tablet    Refill:  0    Chronic Pain: STOP Act (Not applicable) Fill 1 day early if closed on refill date. Avoid benzodiazepines within 8 hours of opioids   HYDROcodone-acetaminophen (NORCO) 10-325 MG tablet    Sig: Take 1 tablet by mouth every 12 (twelve) hours. Must last 30 days.    Dispense:  60 tablet    Refill:  0    Chronic Pain: STOP Act (Not applicable) Fill 1 day early if closed on refill date. Avoid benzodiazepines within 8 hours of opioids   HYDROcodone-acetaminophen (NORCO) 10-325 MG tablet    Sig: Take 1 tablet by mouth every 12 (twelve) hours. Must  last 30 days.    Dispense:  60 tablet    Refill:  0    Chronic Pain: STOP Act (Not applicable) Fill 1 day early if closed on refill date. Avoid benzodiazepines within 8 hours of opioids   Left hip intra-articular steroid injection  Follow-up plan:   Return in about 1 week (around 06/29/2022) for left hip injection.    Recent Visits Date Type Provider Dept  05/12/22 Procedure visit Gillis Santa, MD [facility]  03/30/22 Office Visit Gillis Santa, MD [facility]  Showing recent visits within past 90 days and meeting all other requirements Today's Visits Date Type Provider Dept  06/22/22 Office Visit Gillis Santa, MD [facility]  Showing today's visits and meeting all other requirements Future Appointments No visits were found meeting these conditions. Showing future appointments within next 90 days and meeting all other requirements  I discussed the assessment and treatment plan with the patient. The patient was provided an opportunity to ask questions and all were answered. The patient agreed with the plan and demonstrated an understanding of the instructions.  Patient advised to call back or seek an in-person evaluation if the symptoms or condition worsens.  Duration of encounter: 29mnutes.  Note by: BGillis Santa MD Date: 06/22/2022; Time: 3:43 PM

## 2022-06-22 NOTE — Progress Notes (Signed)
Nursing Pain Medication Assessment:  Safety precautions to be maintained throughout the outpatient stay will include: orient to surroundings, keep bed in low position, maintain call bell within reach at all times, provide assistance with transfer out of bed and ambulation.  Medication Inspection Compliance: Pill count conducted under aseptic conditions, in front of the patient. Neither the pills nor the bottle was removed from the patient's sight at any time. Once count was completed pills were immediately returned to the patient in their original bottle.  Medication: Hydrocodone/APAP Pill/Patch Count:  23 of 60 pills remain Pill/Patch Appearance: Markings consistent with prescribed medication Bottle Appearance: Standard pharmacy container. Clearly labeled. Filled Date: 2 / 20 / 2024 Last Medication intake:  TodaySafety precautions to be maintained throughout the outpatient stay will include: orient to surroundings, keep bed in low position, maintain call bell within reach at all times, provide assistance with transfer out of bed and ambulation.

## 2022-06-25 LAB — TOXASSURE SELECT 13 (MW), URINE

## 2022-07-22 ENCOUNTER — Other Ambulatory Visit: Payer: Self-pay | Admitting: Internal Medicine

## 2022-07-22 DIAGNOSIS — Z1231 Encounter for screening mammogram for malignant neoplasm of breast: Secondary | ICD-10-CM

## 2022-07-26 ENCOUNTER — Encounter: Payer: Self-pay | Admitting: Student in an Organized Health Care Education/Training Program

## 2022-07-26 ENCOUNTER — Ambulatory Visit
Payer: Medicare PPO | Attending: Student in an Organized Health Care Education/Training Program | Admitting: Student in an Organized Health Care Education/Training Program

## 2022-07-26 ENCOUNTER — Ambulatory Visit
Admission: RE | Admit: 2022-07-26 | Discharge: 2022-07-26 | Disposition: A | Discharge status: Home / Self Care | Payer: Medicare PPO | Source: Ambulatory Visit | Attending: Student in an Organized Health Care Education/Training Program | Admitting: Student in an Organized Health Care Education/Training Program

## 2022-07-26 VITALS — BP 172/79 | HR 70 | Temp 98.2°F | Resp 16 | Ht 67.0 in | Wt 110.0 lb

## 2022-07-26 DIAGNOSIS — M1612 Unilateral primary osteoarthritis, left hip: Secondary | ICD-10-CM | POA: Diagnosis present

## 2022-07-26 DIAGNOSIS — M25552 Pain in left hip: Secondary | ICD-10-CM | POA: Diagnosis present

## 2022-07-26 MED ORDER — METHYLPREDNISOLONE ACETATE 40 MG/ML IJ SUSP
40.0000 mg | Freq: Once | INTRAMUSCULAR | Status: AC
  Administered 2022-07-26: 40 mg via INTRA_ARTICULAR
  Filled 2022-07-26: qty 1

## 2022-07-26 MED ORDER — LIDOCAINE HCL 2 % IJ SOLN
20.0000 mL | Freq: Once | INTRAMUSCULAR | Status: AC
  Administered 2022-07-26: 100 mg
  Filled 2022-07-26: qty 40

## 2022-07-26 MED ORDER — IOHEXOL 180 MG/ML  SOLN
10.0000 mL | Freq: Once | INTRAMUSCULAR | Status: AC
  Administered 2022-07-26: 10 mL via INTRA_ARTICULAR
  Filled 2022-07-26: qty 20

## 2022-07-26 MED ORDER — ROPIVACAINE HCL 2 MG/ML IJ SOLN
4.0000 mL | Freq: Once | INTRAMUSCULAR | Status: DC
  Filled 2022-07-26: qty 20

## 2022-07-26 NOTE — Patient Instructions (Signed)

## 2022-07-26 NOTE — Progress Notes (Signed)
PROVIDER NOTE: Interpretation of information contained herein should be left to medically-trained personnel. Specific patient instructions are provided elsewhere under "Patient Instructions" section of medical record. This document was created in part using STT-dictation technology, any transcriptional errors that may result from this process are unintentional.  Patient: Autumn [name] Type: Established DOB: Feb 07, 1946 MRN: [id number] PCP: Enid Baas, MD  Service: Procedure DOS: 07/26/2022 Setting: Ambulatory Location: Ambulatory outpatient facility Delivery: Face-to-face Provider: Edward Jolly, MD Specialty: Interventional Pain Management Specialty designation: 09 Location: Outpatient facility Ref. Prov.: Enid Baas, MD       Interventional Therapy   Procedure: Intra-articular hip injection  #1  Laterality: Left (-LT)  Approach: Percutaneous posterolateral approach. Level: Lower pelvic and hip joint level.  Imaging: Fluoroscopy-guided         Anesthesia: Local anesthesia (1-2% Lidocaine) DOS: 07/26/2022  Performed by: Edward Jolly, MD  Purpose: Diagnostic/Therapeutic Indications: Hip pain severe enough to impact quality of life or function. Rationale (medical necessity): procedure needed and proper for the diagnosis and/or treatment of Autumn [name]'s medical symptoms and needs. 1. Primary osteoarthritis of left hip   2. Left hip pain    NAS-11 Pain score:   Pre-procedure: 7 /10   Post-procedure: 7 /10      Target: Intra-articular hip joint Region: Hip joint, upper (proximal) femoral region Type of procedure: Percutaneous joint injection   Position / Prep / Materials:  Position: Supine  Prep solution: DuraPrep (Iodine Povacrylex [0.7% available iodine] and Isopropyl Alcohol, 74% w/w) Prep Area:  Entire Posterolateral hip area. Materials:  Tray: Block tray Needle(s):  Type: Spinal  Gauge (G): 22    Pre-op H&P Assessment:  Autumn [name] is a 77 y.o. (year  old), female patient, seen today for interventional treatment. She  has a past surgical history that includes Abdominal hysterectomy; Dilation and curettage of uterus; Vulvar lesion removal; Diagnostic laparoscopy; Eye surgery; Colonoscopy with propofol (N/A, 07/30/2019); and Breast excisional biopsy (Right). Autumn [name] has a current medication list which includes the following prescription(s): acetaminophen, albuterol, amlodipine, atenolol, atorvastatin, cetirizine, fluticasone, trelegy ellipta, trelegy ellipta, hydrocodone-acetaminophen, [START ON 07/31/2022] hydrocodone-acetaminophen, [START ON 08/30/2022] hydrocodone-acetaminophen, ipratropium-albuterol, losartan, meloxicam, methylprednisolone, mirtazapine, ondansetron, polyethylene glycol powder, prednisone, propylene glycol, romosozumab-aqqg, and sertraline, and the following Facility-Administered Medications: ropivacaine (pf) 2 mg/ml (0.2%). Her primarily concern today is the Hip Pain (left)  Initial Vital Signs:  Pulse/HCG Rate: 70ECG Heart Rate: 72 Temp: 98.2 F (36.8 C) Resp: 15 BP: (!) 202/70 (Dr Cherylann Ratel notified; pt states she did take BP meds; will follow up with PCP) SpO2: 100 %  BMI: Estimated body mass index is 17.23 kg/m as calculated from the following:   Height as of this encounter:  (1.702 m).   Weight as of this encounter: 110 lb (49.9 kg).  Risk Assessment: Allergies: Reviewed. She is allergic to ivp dye [iodinated contrast media].  Allergy Precautions: None required Coagulopathies: Reviewed. None identified.  Blood-thinner therapy: None at this time Active Infection(s): Reviewed. None identified. Autumn [name] is afebrile  Site Confirmation: Autumn [name] was asked to confirm the procedure and laterality before marking the site Procedure checklist: Completed Consent: Before the procedure and under the influence of no sedative(s), amnesic(s), or anxiolytics, the patient was informed of the treatment options, risks and  possible complications. To fulfill our ethical and legal obligations, as recommended by the American Medical Association's Code of Ethics, I have informed the patient of my clinical impression; the nature and purpose of the treatment or procedure; the risks, benefits, and possible  complications of the intervention; the alternatives, including doing nothing; the risk(s) and benefit(s) of the alternative treatment(s) or procedure(s); and the risk(s) and benefit(s) of doing nothing. The patient was provided information about the general risks and possible complications associated with the procedure. These may include, but are not limited to: failure to achieve desired goals, infection, bleeding, organ or nerve damage, allergic reactions, paralysis, and death. In addition, the patient was informed of those risks and complications associated to the procedure, such as failure to decrease pain; infection; bleeding; organ or nerve damage with subsequent damage to sensory, motor, and/or autonomic systems, resulting in permanent pain, numbness, and/or weakness of one or several areas of the body; allergic reactions; (i.e.: anaphylactic reaction); and/or death. Furthermore, the patient was informed of those risks and complications associated with the medications. These include, but are not limited to: allergic reactions (i.e.: anaphylactic or anaphylactoid reaction(s)); adrenal axis suppression; blood sugar elevation that in diabetics may result in ketoacidosis or comma; water retention that in patients with history of congestive heart failure may result in shortness of breath, pulmonary edema, and decompensation with resultant heart failure; weight gain; swelling or edema; medication-induced neural toxicity; particulate matter embolism and blood vessel occlusion with resultant organ, and/or nervous system infarction; and/or aseptic necrosis of one or more joints. Finally, the patient was informed that Medicine is not an  exact science; therefore, there is also the possibility of unforeseen or unpredictable risks and/or possible complications that may result in a catastrophic outcome. The patient indicated having understood very clearly. We have given the patient no guarantees and we have made no promises. Enough time was given to the patient to ask questions, all of which were answered to the patient's satisfaction. Ms. Sardo has indicated that she wanted to continue with the procedure. Attestation: I, the ordering provider, attest that I have discussed with the patient the benefits, risks, side-effects, alternatives, likelihood of achieving goals, and potential problems during recovery for the procedure that I have provided informed consent. Date  Time: 07/26/2022 10:44 AM  Pre-Procedure Preparation:  Monitoring: As per clinic protocol. Respiration, ETCO2, SpO2, BP, heart rate and rhythm monitor placed and checked for adequate function Safety Precautions: Patient was assessed for positional comfort and pressure points before starting the procedure. Time-out: I initiated and conducted the "Time-out" before starting the procedure, as per protocol. The patient was asked to participate by confirming the accuracy of the "Time Out" information. Verification of the correct person, site, and procedure were performed and confirmed by me, the nursing staff, and the patient. "Time-out" conducted as per Joint Commission's Universal Protocol (UP.01.01.01). Time: 1108 Start Time: 1108 hrs.  Description/Narrative of Procedure:          Rationale (medical necessity): procedure needed and proper for the diagnosis and/or treatment of the patient's medical symptoms and needs. Procedural Technique Safety Precautions: Aspiration looking for blood return was conducted prior to all injections. At no point did we inject any substances, as a needle was being advanced. No attempts were made at seeking any paresthesias. Safe injection practices  and needle disposal techniques used. Medications properly checked for expiration dates. SDV (single dose vial) medications used. Description of the Procedure: Protocol guidelines were followed. The patient was assisted into a comfortable position. The target area was identified and the area prepped in the usual manner. Skin & deeper tissues infiltrated with local anesthetic. Appropriate amount of time allowed to pass for local anesthetics to take effect. The procedure needles were then advanced to  the target area. Proper needle placement secured. Negative aspiration confirmed. Solution injected in intermittent fashion, asking for systemic symptoms every 0.5cc of injectate. The needles were then removed and the area cleansed, making sure to leave some of the prepping solution back to take advantage of its long term bactericidal properties.  Technical description of procedure:  Skin & deeper tissues infiltrated with local anesthetic. Appropriate amount of time allowed to pass for local anesthetics to take effect. The procedure needles were then advanced to the target area. Proper needle placement secured. Negative aspiration confirmed. Solution injected in intermittent fashion, asking for systemic symptoms every 0.5cc of injectate. The needles were then removed and the area cleansed, making sure to leave some of the prepping solution back to take advantage of its long term bactericidal properties.       Vitals:   07/26/22 1046 07/26/22 1048 07/26/22 1107 07/26/22 1111  BP: (!) 202/70 (!) 193/77 (!) 185/79 (!) 172/79  Pulse: 70     Resp:   15 16  Temp: 98.2 F (36.8 C)     TempSrc: Temporal     SpO2: 100%  100% 100%  Weight: 110 lb (49.9 kg)     Height:  (1.702 m)        Start Time: 1108 hrs. End Time: 1110 hrs.  Imaging Guidance (Non-Spinal):          Type of Imaging Technique: Fluoroscopy Guidance (Non-Spinal) Indication(s): Assistance in needle guidance and placement for procedures  requiring needle placement in or near specific anatomical locations not easily accessible without such assistance. Exposure Time: Please see nurses notes. Contrast: Before injecting any contrast, we confirmed that the patient did not have an allergy to iodine, shellfish, or radiological contrast. Once satisfactory needle placement was completed at the desired level, radiological contrast was injected. Contrast injected under live fluoroscopy. No contrast complications. See chart for type and volume of contrast used. Fluoroscopic Guidance: I was personally present during the use of fluoroscopy. "Tunnel Vision Technique" used to obtain the best possible view of the target area. Parallax error corrected before commencing the procedure. "Direction-depth-direction" technique used to introduce the needle under continuous pulsed fluoroscopy. Once target was reached, antero-posterior, oblique, and lateral fluoroscopic projection used confirm needle placement in all planes. Images permanently stored in EMR. Interpretation: I personally interpreted the imaging intraoperatively. Adequate needle placement confirmed in multiple planes. Appropriate spread of contrast into desired area was observed. No evidence of afferent or efferent intravascular uptake. Permanent images saved into the patient's record.  Post-operative Assessment:  Post-procedure Vital Signs:  Pulse/HCG Rate: 7074 Temp: 98.2 F (36.8 C) Resp: 16 BP: (!) 172/79 SpO2: 100 %  EBL: None  Complications: No immediate post-treatment complications observed by team, or reported by patient.  Note: The patient tolerated the entire procedure well. A repeat set of vitals were taken after the procedure and the patient was kept under observation following institutional policy, for this type of procedure. Post-procedural neurological assessment was performed, showing return to baseline, prior to discharge. The patient was provided with post-procedure  discharge instructions, including a section on how to identify potential problems. Should any problems arise concerning this procedure, the patient was given instructions to immediately contact us, at any time, without hesitation. In any case, we plan to contact the patient by telephone for a follow-up status report regarding this interventional procedure.  Comments:  No additional relevant information.      Medications ordered for procedure: Meds ordered this encounter  Medications  iohexol (OMNIPAQUE) 180 MG/ML injection 10 mL    Must be Myelogram-compatible. If not available, you may substitute with a water-soluble, non-ionic, hypoallergenic, myelogram-compatible radiological contrast medium.   lidocaine (XYLOCAINE) 2 % (with pres) injection 400 mg   ropivacaine (PF) 2 mg/mL (0.2%) (NAROPIN) injection 4 mL   methylPREDNISolone acetate (DEPO-MEDROL) injection 40 mg   Medications administered: We administered iohexol, lidocaine, and methylPREDNISolone acetate.  See the medical record for exact dosing, route, and time of administration.  Follow-up plan:   Return for keep 06/04 MM appointment.     Recent Visits Date Type Provider Dept  06/22/22 Office Visit Edward Jolly, MD [facility]  05/12/22 Procedure visit Edward Jolly, MD [facility]  Showing recent visits within past 90 days and meeting all other requirements Today's Visits Date Type Provider Dept  07/26/22 Procedure visit Edward Jolly, MD [facility]  Showing today's visits and meeting all other requirements Future Appointments Date Type Provider Dept  09/14/22 Appointment Edward Jolly, MD [facility]  Showing future appointments within next 90 days and meeting all other requirements  Disposition: Discharge home  Discharge (Date  Time): 07/26/2022; 1122 hrs.   Primary Care Physician: Enid Baas, MD Location: [facility] Note by: Edward Jolly, MD (TTS technology used. I apologize for any typographical errors that were not detected and corrected.) Date: 07/26/2022; Time: 11:18 AM  Disclaimer:  Medicine is not an Visual merchandiser. The only guarantee in medicine is that nothing is guaranteed. It is important to note that the decision to proceed with this intervention was based on the information collected from the patient. The Data and conclusions were drawn from the patient's questionnaire, the interview, and the physical examination. Because the information was provided in large part by the patient, it cannot be guaranteed that it has not been purposely or unconsciously manipulated. Every effort has been made to obtain as much relevant data as possible for this evaluation. It is important to note that the conclusions that lead to this procedure are derived in large part from the available data. Always take into account that the treatment will also be dependent on availability of resources and existing treatment guidelines, considered by other Pain Management Practitioners as being common knowledge and practice, at the time of the intervention. For Medico-Legal purposes, it is also important to point out that variation in procedural techniques and pharmacological choices are the acceptable norm. The indications, contraindications, technique, and results of the above procedure should only be interpreted and judged by a Board-Certified Interventional Pain Specialist with extensive familiarity and expertise in the same exact procedure and technique.

## 2022-07-27 ENCOUNTER — Telehealth: Payer: Self-pay

## 2022-07-27 NOTE — Telephone Encounter (Signed)
Post procedure follow up.  LM 

## 2022-08-03 ENCOUNTER — Telehealth: Payer: Self-pay | Admitting: Student in an Organized Health Care Education/Training Program

## 2022-08-03 ENCOUNTER — Encounter: Payer: Self-pay | Admitting: Student in an Organized Health Care Education/Training Program

## 2022-08-03 NOTE — Telephone Encounter (Signed)
States hip is better after the injection, but having pain in the lower back. Advised her to schedule appt with Dr. Cherylann Ratel to discuss. VV scheduled.

## 2022-08-03 NOTE — Telephone Encounter (Signed)
PT called stated that since her procedure on the 15th she has ben having pain in her hip. I mention to patient that doctor is out this week. Please give patient a call. TY

## 2022-08-11 ENCOUNTER — Ambulatory Visit
Payer: Medicare PPO | Attending: Student in an Organized Health Care Education/Training Program | Admitting: Student in an Organized Health Care Education/Training Program

## 2022-08-11 DIAGNOSIS — M25552 Pain in left hip: Secondary | ICD-10-CM

## 2022-08-11 DIAGNOSIS — M1612 Unilateral primary osteoarthritis, left hip: Secondary | ICD-10-CM

## 2022-08-11 NOTE — Progress Notes (Signed)
Patient: Autumn [name]  Service Category: E/M  Provider: Edward Jolly, MD  DOB: 1945/11/17  DOS: 08/11/2022  Location: Office  MRN: [id number]  Setting: Ambulatory outpatient  Referring Provider: Enid Baas, MD  Type: Established Patient  Specialty: Interventional Pain Management  PCP: Enid Baas, MD  Location: Remote location  Delivery: TeleHealth     Virtual Encounter - Pain Management PROVIDER NOTE: Information contained herein reflects review and annotations entered in association with encounter. Interpretation of such information and data should be left to medically-trained personnel. Information provided to patient can be located elsewhere in the medical record under "Patient Instructions". Document created using STT-dictation technology, any transcriptional errors that may result from process are unintentional.    Contact & Pharmacy Preferred: [phone] Home: [phone] (home) Mobile: [phone] (mobile) E-mail: [email or web address]  [facility] [phone] - Closed - [place], [place] - [address] [place] [address] Phone: [phone] Fax: [phone]  [id number] - [place], [place] - [address] [place] [address] Phone: [phone] Fax: [phone]  [facility][id number] [place], [place] - [address] AT [facility] [address] [facility] [address] [place] [address] Phone: [phone] Fax: [phone]  [id number] - [address], [place] - [address] [place] [address] [place]. [place]. [place] [address] Phone: [phone] Fax: [phone]  [id number] - [place], [place] - [address] [place] [address] AT [facility] [address] [place] [address] [place] [address] Phone: [phone] Fax: [phone]  [facility][id number] [place], [place] - [address] AT [facility] [address] [facility] [place] [address] Phone: [phone] Fax:  [phone]   Pre-screening  [name] offered "in-person" vs "virtual" encounter. She indicated preferring virtual for this encounter.   Reason COVID-19*  Social distancing based on CDC and AMA recommendations.   I contacted Autumn [name] on 08/11/2022 via telephone.      I clearly identified myself as Edward Jolly, MD. I verified that I was speaking with the correct person using two identifiers (Name: Autumn [name], and date of birth: Sep 07, 1945).  Consent I sought verbal advanced consent from Autumn [name] for virtual visit interactions. I informed Ms. Abdo of possible security and privacy concerns, risks, and limitations associated with providing "not-in-person" medical evaluation and management services. I also informed Ms. Highland of the availability of "in-person" appointments. Finally, I informed her that there would be a charge for the virtual visit and that she could be  personally, fully or partially, financially responsible for it. Autumn [name] expressed understanding and agreed to proceed.   Historic Elements   Autumn [name] is a 77 y.o. year old, female patient evaluated today after our last contact on 08/03/2022. Autumn [name]  has a past medical history of Allergy, Anemia, Arthritis, COPD (chronic obstructive pulmonary disease) (HCC), Glaucoma, H/O uvulectomy, Hypertension, Osteoporosis, Ovarian cancer (HCC), Postherpetic neuralgia, Pulmonary sarcoidosis (HCC), and Sarcoid. She also  has a past surgical history that includes Abdominal hysterectomy; Dilation and curettage of uterus; Vulvar lesion removal; Diagnostic laparoscopy; Eye surgery; Colonoscopy with propofol (N/A, 07/30/2019); and Breast excisional biopsy (Right). [name] has a current medication list which includes the following prescription(s): acetaminophen, albuterol, amlodipine, atenolol, atorvastatin, cetirizine, fluticasone, trelegy ellipta, trelegy ellipta, hydrocodone-acetaminophen, [START ON 08/30/2022]  hydrocodone-acetaminophen, ipratropium-albuterol, losartan, meloxicam, methylprednisolone, mirtazapine, ondansetron, polyethylene glycol powder, prednisone, propylene  glycol, romosozumab-aqqg, and sertraline. She  reports that she has quit smoking. Her smoking use included cigarettes. She has never used smokeless tobacco. She reports that she does not drink alcohol and does not use drugs. Autumn [name] is allergic to ivp dye [iodinated contrast media].  BMI: Estimated body mass index is 17.23 kg/m as calculated from the following:   Height as of 07/26/22: 5\' 7"  (1.702 m).   Weight as of 07/26/22: 110 lb (49.9 kg). Last encounter: 06/22/2022. Last procedure: 07/26/2022.  HPI  Today, she is being contacted for a post-procedure assessment.   Post-procedure evaluation   Procedure: Intra-articular hip injection  #1  Laterality: Left (-LT)  Approach: Percutaneous posterolateral approach. Level: Lower pelvic and hip joint level.  Imaging: Fluoroscopy-guided         Anesthesia: Local anesthesia (1-2% Lidocaine) DOS: 07/26/2022  Performed by: Edward Jolly, MD  Purpose: Diagnostic/Therapeutic Indications: Hip pain severe enough to impact quality of life or function. Rationale (medical necessity): procedure needed and proper for the diagnosis and/or treatment of Autumn [name]'s medical symptoms and needs. 1. Primary osteoarthritis of left hip   2. Left hip pain    NAS-11 Pain score:   Pre-procedure: 7 /10   Post-procedure: 7 /10       Effectiveness:  Initial hour after procedure: 100 % Subsequent 4-6 hours post-procedure: 100 % Analgesia past initial 6 hours: 80 %  Ongoing improvement:  Analgesic:  60-75% Function: [name] reports improvement in function ROM: Autumn [name] reports improvement in ROM    Laboratory Chemistry Profile   Renal Lab Results  Component Value Date   BUN 28 (H) 02/13/2018   CREATININE 0.68 02/13/2018   GFRAA >60 02/13/2018   GFRNONAA >60 02/13/2018      Hepatic Lab Results  Component Value Date   AST 21 03/29/2015   ALT 20 03/29/2015   ALBUMIN 4.4 03/29/2015   ALKPHOS 46 03/29/2015   LIPASE 116 12/15/2013     Electrolytes Lab Results  Component Value Date   NA 145 02/13/2018   K 4.6 02/13/2018   CL 100 02/13/2018   CALCIUM 10.0 02/13/2018   MG 2.7 (H) 02/07/2018   PHOS 3.2 02/07/2018     Bone No results found for: VD25OH, VD125OH2TOT, ZO1096EA5, WU9811BJ4, 25OHVITD1, 25OHVITD2, 25OHVITD3, TESTOFREE, TESTOSTERONE   Inflammation (CRP: Acute Phase) (ESR: Chronic Phase) Lab Results  Component Value Date   ESRSEDRATE 48 (H) 05/25/2011       Note: Above Lab results reviewed.  Imaging  MM DIAG BREAST TOMO UNI RIGHT CLINICAL DATA:  77 year old female recalled from screening mammogram dated 08/14/2020 for a possible right breast mass.  EXAM: DIGITAL DIAGNOSTIC UNILATERAL RIGHT MAMMOGRAM WITH TOMOSYNTHESIS AND CAD  TECHNIQUE: Right digital diagnostic mammography and breast tomosynthesis was performed. The images were evaluated with computer-aided detection.  COMPARISON:  Previous exam(s).  ACR Breast Density Category c: The breast tissue is heterogeneously dense, which may obscure small masses.  FINDINGS: Previously described, possible mass in the upper right breast at posterior depth is seen on the MLO projection only resolves on today's additional views. No suspicious findings are identified.  IMPRESSION: No mammographic evidence of malignancy.  RECOMMENDATION: Screening mammogram in one year.(Code:SM-B-01Y)  I have discussed the findings and recommendations with the patient. If applicable, a reminder letter will be sent to the patient regarding the next appointment.  BI-RADS CATEGORY  1: Negative.  Electronically Signed   By: Sande Brothers M.D.   On: 08/27/2020 15:39  Narrative & Impression  CLINICAL  DATA:  Left hip pain for 1 month.   EXAM: MR OF THE LEFT HIP WITHOUT CONTRAST    TECHNIQUE: Multiplanar, multisequence MR imaging was performed. No intravenous contrast was administered.   COMPARISON:  CT scan 06/27/2021   FINDINGS: Both hips are normally located. No stress fracture or AVN. Moderate age related bilateral hip joint degenerative changes with joint space narrowing and small joint effusions. The superior anterior labrum is degenerated and torn.   The pubic symphysis and SI joints are intact. No pelvic fractures or bone lesions.   Moderate edema like signal changes in the left adductor muscles consistent with muscle strains or partial tears. This likely accounts for the patient's left hip pain. The hamstring tendons are intact.   Large amount of stool is noted in the rectum and sigmoid colon suggesting constipation and mild fecal impaction. No inguinal mass or hernia.   IMPRESSION: 1. Moderate age related bilateral hip joint degenerative changes but no stress fracture or AVN. 2. The superior anterior labrum is degenerated and torn. 3. Moderate edema like signal changes in the left adductor muscles consistent with muscle strains or partial tears. This likely accounts for the patient's left hip pain. 4. Large amount of stool in the rectum and sigmoid colon suggesting constipation and mild fecal impaction. With full facets chest    Assessment  The primary encounter diagnosis was Post herpetic neuralgia. Diagnoses of Neuropathic pain, Lumbar facet arthropathy, Spondylosis without myelopathy or radiculopathy, lumbar region, and Thoracic facet joint syndrome were also pertinent to this visit.  Plan of Care    Assessment  The primary encounter diagnosis was Primary osteoarthritis of left hip. A diagnosis of Left hip pain was also pertinent to this visit.  Plan of Care  Excellent response to left hip intra-articular steroid injection.  Continue to monitor symptoms.  Patient is still having fairly severe low back pain related to lumbar facet  arthropathy.  Unfortunately her insurance denied lumbar radiofrequency ablation even though she has had positive diagnostic nerve blocks.  I informed her that we will discuss options when she follows up next month.  Patient endorsed understanding.  Follow-up plan:   Return for Keep sch. appt.     Recent Visits Date Type Provider Dept  07/26/22 Procedure visit Edward Jolly, MD [facility]  06/22/22 Office Visit Edward Jolly, MD [facility]  Showing recent visits within past 90 days and meeting all other requirements Today's Visits Date Type Provider Dept  08/11/22 Office Visit Edward Jolly, MD [facility]  Showing today's visits and meeting all other requirements Future Appointments Date Type Provider Dept  09/14/22 Appointment Edward Jolly, MD [facility]  Showing future appointments within next 90 days and meeting all other requirements  I discussed the assessment and treatment plan with the patient. The patient was provided an opportunity to ask questions and all were answered. The patient agreed with the plan and demonstrated an understanding of the instructions.  Patient advised to call back or seek an in-person evaluation if the symptoms or condition worsens.  Duration of encounter: .  Note by: Edward Jolly, MD Date: 08/11/2022; Time: 4:22 PM

## 2022-08-30 ENCOUNTER — Ambulatory Visit
Admission: RE | Admit: 2022-08-30 | Discharge: 2022-08-30 | Disposition: A | Discharge status: Home / Self Care | Payer: Medicare PPO | Source: Ambulatory Visit | Attending: Internal Medicine | Admitting: Internal Medicine

## 2022-08-30 DIAGNOSIS — Z1231 Encounter for screening mammogram for malignant neoplasm of breast: Secondary | ICD-10-CM | POA: Diagnosis present

## 2022-09-07 ENCOUNTER — Ambulatory Visit (INDEPENDENT_AMBULATORY_CARE_PROVIDER_SITE_OTHER): Payer: Medicare PPO | Admitting: Primary Care

## 2022-09-07 ENCOUNTER — Encounter: Payer: Self-pay | Admitting: Primary Care

## 2022-09-07 VITALS — BP 124/60 | HR 74 | Temp 97.8°F | Ht 67.0 in | Wt 109.0 lb

## 2022-09-07 DIAGNOSIS — J9611 Chronic respiratory failure with hypoxia: Secondary | ICD-10-CM | POA: Diagnosis not present

## 2022-09-07 DIAGNOSIS — J449 Chronic obstructive pulmonary disease, unspecified: Secondary | ICD-10-CM | POA: Diagnosis not present

## 2022-09-07 DIAGNOSIS — D860 Sarcoidosis of lung: Secondary | ICD-10-CM | POA: Diagnosis not present

## 2022-09-07 MED ORDER — BENZONATATE 100 MG PO CAPS: 100.0000 mg | ORAL_CAPSULE | Freq: Three times a day (TID) | ORAL | 1 refills | Status: DC | PRN

## 2022-09-07 NOTE — Assessment & Plan Note (Addendum)
-   Stable; No acute respiratory symptoms. Chronic dry cough.  - Continue Trelegy one puff daily and ipratropium-Albuterol neb q 6 hours as needed for breakthrough shortness of breath/wheezing  - Renew nebulizer supplies - RX tesslon perles 100mg  Q8 hours prn cough  - FU in 6 months or sooner if needed

## 2022-09-07 NOTE — Assessment & Plan Note (Signed)
-   Stable; Continue 2L oxygen 24/7

## 2022-09-07 NOTE — Assessment & Plan Note (Addendum)
-   CT chest imaging in July 2023 showed stable scarring upper lung fields, new small linear densities in right lower lobe, possibly suggesting progression of fibrosis or new foci of pneumonia - Respiratory symptoms have remained stable over the last 6-12 months, we will hold off on getting repeat chest imaging since she is minimally symptomatic, consider repeating if symptoms worsen

## 2022-09-07 NOTE — Patient Instructions (Addendum)
Recommendations  Continue Trelegy 1 puff daily Use nebulizer every 4-6 hours as needed for breakthrough shortness of breath/wheezing Take tesslon perles every 8 hours as needed for cough suppression Continue 2L oxygen 24/7 Notify office if you develop worsening cough or shortness of breath, would repeat CT chest imaging    Orders: Renew nebulizer supplies   Follow-up 6 months with Dr. Jayme Cloud or sooner if needed

## 2022-09-07 NOTE — Progress Notes (Signed)
@Patient  ID: Autumn [name], female    DOB: [birth year], 77 y.o.   MRN: [id number]  Chief Complaint  Patient presents with   Follow-up    Occ dry cough.     Referring provider: Enid Baas, MD  HPI: 77 year old female, former smoker quit 1988.  Past medical history significant for pulmonary hypertension, COPD, pulmonary sarcoidosis, chronic respiratory failure, ovarian cancer.   09/07/2022 Patient presents today for 27-month follow-up. Accompanied by her son. Breathing is baseline, no changes since last visit. No recent hospitalizations. No acute respiratory symptoms. She has a chronic dry cough. She will take mucinex occasionally. She uses Trelegy daily and feels it helps. She uses nebulizer in the evening before bed. Needs nebulizer supplies renewed. She wears 2L oxygen during daytime and at night. She does some of her own cooking and is able to bath/dress herself. Daughter helps out around the house. She is looking into getting aid to help. She ambulates with rolling walker. She was getting physical therapy through [facility] but tells me they did not do much. Denies falls.    Allergies  Allergen Reactions   Ivp Dye [Iodinated Contrast Media] Hives and Itching    Immunization History  Administered Date(s) Administered   Fluad Quad(high Dose 65+) 12/22/2020   Influenza Inj Mdck Quad Pf 01/04/2022   Influenza Split 12/17/2016   Influenza, High Dose Seasonal PF 01/03/2017, 12/16/2017, 12/22/2018   Influenza-Unspecified 01/28/2011, 01/10/2012, 03/12/2013, 02/09/2014, 02/04/2015, 01/25/2016, 12/22/2018, 12/20/2019   PFIZER Comirnaty(Gray Top)Covid-19 Tri-Sucrose Vaccine 05/15/2019, 06/12/2019   PFIZER(Purple Top)SARS-COV-2 Vaccination 05/15/2019, 06/12/2019   Pneumococcal Conjugate-13 02/17/2016   Pneumococcal Polysaccharide-23 05/03/2011   Zoster Recombinat (Shingrix) 10/14/2017, 12/16/2017    Past Medical History:  Diagnosis Date   Allergy    Anemia    Arthritis    COPD  (chronic obstructive pulmonary disease) (HCC)    Glaucoma    H/O uvulectomy    Hypertension    Osteoporosis    Ovarian cancer (HCC)    Postherpetic neuralgia    Pulmonary sarcoidosis (HCC)    Sarcoid     Tobacco History: Social History   Tobacco Use  Smoking Status Former   Years: 20   Types: Cigarettes   Quit date: 1988   Years since quitting: 36.4  Smokeless Tobacco Never  Tobacco Comments   Quit smoking between age [age]. 1 pack lasted >2 weeks.   Counseling given: Not Answered Tobacco comments: Quit smoking between age [age]. 1 pack lasted >2 weeks.   Outpatient Medications Prior to Visit  Medication Sig Dispense Refill   acetaminophen (TYLENOL) 500 MG tablet Take 500 mg by mouth every 4 (four) hours as needed for mild pain or moderate pain.     albuterol (VENTOLIN HFA) 108 (90 Base) MCG/ACT inhaler INHALE 2 PUFFS BY MOUTH EVERY 6 HOURS AS NEEDED FOR WHEEZE     amLODipine (NORVASC) 5 MG tablet Take 2.5 mg by mouth daily.     atenolol (TENORMIN) 50 MG tablet Take 50 mg by mouth 2 (two) times daily.      atorvastatin (LIPITOR) 10 MG tablet Take 1 tablet by mouth daily.      cetirizine (ZYRTEC) 10 MG tablet Take 10 mg by mouth as needed for allergies.     fluticasone (FLONASE) 50 MCG/ACT nasal spray Place 2 sprays into both nostrils daily as needed.     Fluticasone-Umeclidin-Vilant (TRELEGY ELLIPTA) 200-62.5-25 MCG/ACT AEPB Inhale 1 puff into the lungs daily. 60 each 11   HYDROcodone-acetaminophen (NORCO) 10-325 MG tablet Take 1  tablet by mouth every 12 (twelve) hours. Must last 30 days. 60 tablet 0   ipratropium-albuterol (DUONEB) 0.5-2.5 (3) MG/3ML SOLN Take 3 mLs by nebulization every 6 (six) hours as needed. 360 mL 0   losartan (COZAAR) 50 MG tablet Take 1 tablet by mouth daily.     meloxicam (MOBIC) 7.5 MG tablet Take 7.5 mg by mouth daily.     mirtazapine (REMERON) 7.5 MG tablet Take 7.5 mg by mouth at bedtime.     ondansetron (ZOFRAN-ODT) 4 MG disintegrating tablet  Take 1 tablet (4 mg total) by mouth every 8 (eight) hours as needed. 20 tablet 0   polyethylene glycol powder (GLYCOLAX/MIRALAX) 17 GM/SCOOP powder Take by mouth.     predniSONE (DELTASONE) 5 MG tablet Take 1 tablet (5 mg total) by mouth daily. 30 tablet 3   Propylene Glycol 0.6 % SOLN Place 1 drop into both eyes daily as needed (dryness).     Romosozumab-aqqg (EVENITY) 105 MG/1. SOSY injection Inject 210 mg into the skin. Once a month     sertraline (ZOLOFT) 25 MG tablet Take 25 mg by mouth daily.  11   Fluticasone-Umeclidin-Vilant (TRELEGY ELLIPTA) 200-62.5-25 MCG/ACT AEPB Inhale 1 puff into the lungs daily. 14 each 0   methylPREDNISolone (MEDROL DOSEPAK) 4 MG TBPK tablet Use as directed. 21 each 0   No facility-administered medications prior to visit.   Review of Systems  Review of Systems  Constitutional: Negative.   Respiratory:  Positive for cough. Negative for chest tightness, shortness of breath and wheezing.   Cardiovascular: Negative.    Physical Exam  BP 124/60 (BP Location: Left Arm, Cuff Size: Normal)   Pulse 74   Temp 97.8 F (36.6 C) (Temporal)   Ht 5\' 7"  (1.702 m)   Wt 109 lb (49.4 kg)   SpO2 99%   BMI 17.07 kg/m  Physical Exam Constitutional:      General: She is not in acute distress.    Appearance: Normal appearance. She is not ill-appearing.  HENT:     Head: Normocephalic and atraumatic.     Mouth/Throat:     Mouth: Mucous membranes are moist.     Pharynx: Oropharynx is clear.  Cardiovascular:     Rate and Rhythm: Normal rate and regular rhythm.     Heart sounds: Murmur heard.  Pulmonary:     Effort: Pulmonary effort is normal.     Breath sounds: No wheezing, rhonchi or rales.     Comments: No overt rales, wheezing or rhonchi  Musculoskeletal:        General: Normal range of motion.  Neurological:     General: No focal deficit present.     Mental Status: She is alert and oriented to person, place, and time. Mental status is at baseline.   Psychiatric:        Mood and Affect: Mood normal.        Behavior: Behavior normal.        Thought Content: Thought content normal.        Judgment: Judgment normal.      Lab Results:  CBC    Component Value Date/Time   WBC 13.9 (H) 06/26/2021 2200   RBC 4.01 06/26/2021 2200   HGB 10.8 (L) 06/26/2021 2200   HGB 9.3 (L) 05/20/2014 0457   HCT 36.0 06/26/2021 2200   HCT 29.8 (L) 05/20/2014 0457   PLT 327 06/26/2021 2200   PLT 401 05/20/2014 0457   MCV 89.8 06/26/2021 2200   MCV 82  05/20/2014 0457   MCH 26.9 06/26/2021 2200   MCHC 30.0 06/26/2021 2200   RDW 13.1 06/26/2021 2200   RDW 13.9 05/20/2014 0457   LYMPHSABS 0.4 (L) 02/09/2018 0552   LYMPHSABS 0.3 (L) 05/20/2014 0457   MONOABS 1.0 02/09/2018 0552   MONOABS 0.9 05/20/2014 0457   EOSABS 0.0 02/09/2018 0552   EOSABS 0.0 05/20/2014 0457   BASOSABS 0.0 02/09/2018 0552   BASOSABS 0.1 05/20/2014 0457    BMET    Component Value Date/Time   NA 138 06/26/2021 2200   NA 138 05/19/2014 0453   K 3.7 06/26/2021 2200   K 4.0 05/19/2014 0453   CL 94 (L) 06/26/2021 2200   CL 97 (L) 05/19/2014 0453   CO2 34 (H) 06/26/2021 2200   CO2 39 (H) 05/19/2014 0453   GLUCOSE 118 (H) 06/26/2021 2200   GLUCOSE 124 (H) 05/19/2014 0453   BUN 29 (H) 06/26/2021 2200   BUN 20 (H) 05/19/2014 0453   CREATININE 0.79 06/26/2021 2200   CREATININE 0.80 05/19/2014 0453   CALCIUM 9.5 06/26/2021 2200   CALCIUM 9.6 05/19/2014 0453   GFRNONAA >60 06/26/2021 2200   GFRNONAA >60 05/19/2014 0453   GFRNONAA >60 12/15/2013 1649   GFRAA >60 02/13/2018 0459   GFRAA >60 05/19/2014 0453   GFRAA >60 12/15/2013 1649    BNP    Component Value Date/Time   BNP 53.0 02/04/2018 1430    ProBNP No results found for: "PROBNP"  Imaging: MM 3D SCREENING MAMMOGRAM BILATERAL BREAST  Result Date: 09/01/2022 CLINICAL DATA:  Screening. EXAM: DIGITAL SCREENING BILATERAL MAMMOGRAM WITH TOMOSYNTHESIS AND CAD TECHNIQUE: Bilateral screening digital craniocaudal  and mediolateral oblique mammograms were obtained. Bilateral screening digital breast tomosynthesis was performed. The images were evaluated with computer-aided detection. COMPARISON:  Previous exam(s). ACR Breast Density Category c: The breasts are heterogeneously dense, which may obscure small masses. FINDINGS: There are no findings suspicious for malignancy. IMPRESSION: No mammographic evidence of malignancy. A result letter of this screening mammogram will be mailed directly to the patient. RECOMMENDATION: Screening mammogram in one year. (Code:SM-B-01Y) BI-RADS CATEGORY  1: Negative. Electronically Signed   By: Edwin Cap M.D.   On: 09/01/2022 10:49     Assessment & Plan:   COPD (chronic obstructive pulmonary disease) (HCC) - Stable; No acute respiratory symptoms. Chronic dry cough.  - Continue Trelegy one puff daily and ipratropium-Albuterol neb q 6 hours as needed for breakthrough shortness of breath/wheezing  - Renew nebulizer supplies - RX tesslon perles 100mg  Q8 hours prn cough  - FU in 6 months or sooner if needed   Pulmonary sarcoidosis (HCC) - CT chest imaging in July 2023 showed stable scarring upper lung fields, new small linear densities in right lower lobe, possibly suggesting progression of fibrosis or new foci of pneumonia - Respiratory symptoms have remained stable over the last 6-12 months, we will hold off on getting repeat chest imaging since she is minimally symptomatic, consider repeating if symptoms worsen   Chronic respiratory failure (HCC) - Stable; Continue 2L oxygen 24/7   Glenford Bayley, NP 09/07/2022

## 2022-09-14 ENCOUNTER — Ambulatory Visit
Payer: Medicare PPO | Attending: Student in an Organized Health Care Education/Training Program | Admitting: Student in an Organized Health Care Education/Training Program

## 2022-09-14 ENCOUNTER — Encounter: Payer: Self-pay | Admitting: Student in an Organized Health Care Education/Training Program

## 2022-09-14 VITALS — BP 155/66 | HR 71 | Temp 97.0°F | Resp 16 | Ht 66.0 in | Wt 109.0 lb

## 2022-09-14 DIAGNOSIS — B0229 Other postherpetic nervous system involvement: Secondary | ICD-10-CM | POA: Diagnosis present

## 2022-09-14 DIAGNOSIS — M25552 Pain in left hip: Secondary | ICD-10-CM | POA: Insufficient documentation

## 2022-09-14 DIAGNOSIS — G894 Chronic pain syndrome: Secondary | ICD-10-CM | POA: Diagnosis present

## 2022-09-14 DIAGNOSIS — M1612 Unilateral primary osteoarthritis, left hip: Secondary | ICD-10-CM | POA: Insufficient documentation

## 2022-09-14 DIAGNOSIS — M47816 Spondylosis without myelopathy or radiculopathy, lumbar region: Secondary | ICD-10-CM | POA: Diagnosis not present

## 2022-09-14 MED ORDER — HYDROCODONE-ACETAMINOPHEN 10-325 MG PO TABS: 1.0000 | ORAL_TABLET | Freq: Two times a day (BID) | ORAL | 0 refills | Status: AC | PRN

## 2022-09-14 NOTE — Progress Notes (Signed)
PROVIDER NOTE: Information contained herein reflects review and annotations entered in association with encounter. Interpretation of such information and data should be left to medically-trained personnel. Information provided to patient can be located elsewhere in the medical record under "Patient Instructions". Document created using STT-dictation technology, any transcriptional errors that may result from process are unintentional.    Patient: Autumn [name]  Service Category: E/M  Provider: Edward Jolly, MD  DOB: March 19, 1946  DOS: 09/14/2022  Specialty: Interventional Pain Management  MRN: [id number]  Setting: Ambulatory outpatient  PCP: Enid Baas, MD  Type: Established Patient    Referring Provider: Enid Baas, MD  Location: Office  Delivery: Face-to-face     HPI  Autumn [name], a 77 y.o. year old female, is here today because of her Post herpetic neuralgia [B02.29]. Autumn [name] primary complain today is Other (Left intercostal post herpetic neuralgia ) and Joint Pain (Generalized arthritic pain. ) Last encounter: My last encounter with her was on 08/11/22  Pertinent problems: Autumn [name] has COPD (chronic obstructive pulmonary disease) (HCC); Post herpetic neuralgia; Pulmonary sarcoidosis (HCC); Chronic pain syndrome; Thoracic radiculopathy; and Spondylosis without myelopathy or radiculopathy, lumbar region on their pertinent problem list. Severity of Chronic pain is reported as a 8 /10. Location: Back Mid, Left/Radaites from mid left back acrodd rib cage to under left breast area. Onset: More than a month ago. Quality: Constant, Burning, Shooting, Sharp. Timing: Constant. Modifying factor(s): Hydrocodone. Vitals:  height is 5\' 6"  (1.676 m) and weight is 109 lb (49.4 kg). Her temporal temperature is 97 F (36.1 C) (abnormal). Her blood pressure is 155/66 (abnormal) and her pulse is 71. Her respiration is 16 and oxygen saturation is 99%.   Reason for encounter: medication  management.    Left chest wall and left intercostal pain related to postherpetic neuralgia, is wondering if there are any treatment options for this. Continues to endorse benefit after her left hip steroid injection, repeat as needed. Discussed weaning hydrocodone and patient is usually taking 10 mg daily.  This is reasonable.  We will adjust prescription to reflect wean.   Pharmacotherapy Assessment  Analgesic Hydrocodone 10 mg BID PRN  Monitoring: [place] PMP: PDMP reviewed during this encounter.       Pharmacotherapy: No side-effects or adverse reactions reported. Compliance: No problems identified. Effectiveness: Clinically acceptable.  Vernie Ammons, RN  09/14/2022  3:27 PM  Sign when Signing Visit Nursing Pain Medication Assessment:  Safety precautions to be maintained throughout the outpatient stay will include: orient to surroundings, keep bed in low position, maintain call bell within reach at all times, provide assistance with transfer out of bed and ambulation.  Medication Inspection Compliance: Pill count conducted under aseptic conditions, in front of the patient. Neither the pills nor the bottle was removed from the patient's sight at any time. Once count was completed pills were immediately returned to the patient in their original bottle.  Medication: Hydrocodone/APAP Pill/Patch Count:  37 of 60 pills remain Pill/Patch Appearance: Markings consistent with prescribed medication Bottle Appearance: Standard pharmacy container. Clearly labeled. Filled Date: 05 / 07 / 2024 Last Medication intake:  Today     UDS:  Summary  Date Value Ref Range Status  06/22/2022 Note  Final    Comment:    ==================================================================== [employer] [facility] (MW) ==================================================================== Test                             Result  Flag       Units  Drug Present and Declared for Prescription Verification    Hydrocodone                    3026         EXPECTED   ng/mg creat   Hydromorphone                  148          EXPECTED   ng/mg creat   Dihydrocodeine                 357          EXPECTED   ng/mg creat   Norhydrocodone                 >3571        EXPECTED   ng/mg creat    Sources of hydrocodone include scheduled prescription medications.    Hydromorphone, dihydrocodeine and norhydrocodone are expected    metabolites of hydrocodone. Hydromorphone and dihydrocodeine are    also available as scheduled prescription medications.  ==================================================================== Test                      Result    Flag   Units      Ref Range   Creatinine              140              mg/dL      >=95 ==================================================================== Declared Medications:  The flagging and interpretation on this report are based on the  following declared medications.  Unexpected results may arise from  inaccuracies in the declared medications.   **Note: The testing scope of this panel includes these medications:   Hydrocodone (Norco)   **Note: The testing scope of this panel does not include the  following reported medications:   Acetaminophen (Tylenol)  Acetaminophen (Norco)  Albuterol (Ventolin HFA)  Albuterol (Duoneb)  Amlodipine (Norvasc)  Atenolol (Tenormin)  Atorvastatin (Lipitor)  Cetirizine (Zyrtec)  Fluticasone (Flonase)  Fluticasone (Trelegy)  Ipratropium (Duoneb)  Losartan (Cozaar)  Methylprednisolone (Medrol)  Mirtazapine (Remeron)  Ondansetron (Zofran)  Polyethylene Glycol  Prednisone (Deltasone)  Romosozumab (Evenity)  Sertraline (Zoloft)  Umeclidinium (Trelegy)  Vilanterol (Trelegy) ==================================================================== For clinical consultation, please call [phone]. ====================================================================      ROS  Constitutional: Denies any fever  or chills Gastrointestinal: No reported hemesis, hematochezia, vomiting, or acute GI distress Musculoskeletal: LBP, left intercostal pain, chest wall pain Neurological: No reported episodes of acute onset apraxia, aphasia, dysarthria, agnosia, amnesia, paralysis, loss of coordination, or loss of consciousness  Medication Review  Fluticasone-Umeclidin-Vilant, HYDROcodone-acetaminophen, Propylene Glycol, Romosozumab-aqqg, acetaminophen, albuterol, amLODipine, atenolol, atorvastatin, benzonatate, cetirizine, fluticasone, ipratropium-albuterol, losartan, mirtazapine, ondansetron, polyethylene glycol powder, predniSONE, and sertraline  History Review  Allergy: Autumn [name] is allergic to ivp dye [iodinated contrast media]. Drug: Autumn [name]  reports no history of drug use. Alcohol:  reports no history of alcohol use. Tobacco:  reports that she quit smoking about 36 years ago. Her smoking use included cigarettes. She has never used smokeless tobacco. Social: Autumn [name]  reports that she quit smoking about 36 years ago. Her smoking use included cigarettes. She has never used smokeless tobacco. She reports that she does not drink alcohol and does not use drugs. Medical:  has a past medical history of Allergy, Anemia, Arthritis, COPD (chronic obstructive pulmonary disease) (HCC), Glaucoma, H/O uvulectomy, Hypertension, Osteoporosis, Ovarian  cancer [facility]), Postherpetic neuralgia, Pulmonary sarcoidosis (HCC), and Sarcoid. Surgical: Autumn [name]  has a past surgical history that includes Abdominal hysterectomy; Dilation and curettage of uterus; Vulvar lesion removal; Diagnostic laparoscopy; Eye surgery; Colonoscopy with propofol (N/A, 07/30/2019); and Breast excisional biopsy (Right). Family: family history includes Breast cancer (age of onset: 30) in her mother; Cancer in her mother; Hypertension in her father.  Laboratory Chemistry Profile   Renal Lab Results  Component Value Date   BUN 29 (H) 06/26/2021    CREATININE 0.79 06/26/2021   GFRAA >60 02/13/2018   GFRNONAA >60 06/26/2021    Hepatic Lab Results  Component Value Date   AST 25 06/26/2021   ALT 23 06/26/2021   ALBUMIN 4.6 06/26/2021   ALKPHOS 42 06/26/2021   LIPASE 32 06/26/2021    Electrolytes Lab Results  Component Value Date   NA 138 06/26/2021   K 3.7 06/26/2021   CL 94 (L) 06/26/2021   CALCIUM 9.5 06/26/2021   MG 2.7 (H) 02/07/2018   PHOS 3.2 02/07/2018    Bone No results found for: "VD25OH", "VD125OH2TOT", "YN8295AO1", "HY8657QI6", "25OHVITD1", "25OHVITD2", "25OHVITD3", "TESTOFREE", "TESTOSTERONE"  Inflammation (CRP: Acute Phase) (ESR: Chronic Phase) Lab Results  Component Value Date   ESRSEDRATE 48 (H) 05/25/2011         Note: Above Lab results reviewed.   Physical Exam  General appearance: Well nourished, well developed, and well hydrated. In no apparent acute distress Mental status: Alert, oriented x 3 (person, place, & time)       Respiratory: No evidence of acute respiratory distress Eyes: PERLA Vitals: BP (!) 155/66 (BP Location: Right Arm, Patient Position: Sitting, Cuff Size: Normal)   Pulse 71   Temp (!) 97 F (36.1 C) (Temporal)   Resp 16   Ht 5\' 6"  (1.676 m)   Wt 109 lb (49.4 kg)   SpO2 99% Comment: 2 liters  BMI 17.59 kg/m  BMI: Estimated body mass index is 17.59 kg/m as calculated from the following:   Height as of this encounter: 5\' 6"  (1.676 m).   Weight as of this encounter: 109 lb (49.4 kg). Ideal: Ideal body weight: 59.3 kg (130 lb 11.7 oz)  Lumbar Spine Area Exam  Skin & Axial Inspection: No masses, redness, or swelling Alignment: Symmetrical Functional ROM: Pain restricted ROM       Stability: No instability detected Muscle Tone/Strength: Functionally intact. No obvious neuro-muscular anomalies detected. Sensory (Neurological): Musculoskeletal pain pattern Palpation: No palpable anomalies       Provocative Tests: Hyperextension/rotation test: (+) bilaterally for facet joint  pain. Left hip pain, worse with weightbearing Gait & Posture Assessment  Ambulation: Limited Gait: Antalgic Posture: WNL  Lower Extremity Exam    Side: Right lower extremity  Side: Left lower extremity  Stability: No instability observed          Stability: No instability observed          Skin & Extremity Inspection: Skin color, temperature, and hair growth are WNL. No peripheral edema or cyanosis. No masses, redness, swelling, asymmetry, or associated skin lesions. No contractures.  Skin & Extremity Inspection: Skin color, temperature, and hair growth are WNL. No peripheral edema or cyanosis. No masses, redness, swelling, asymmetry, or associated skin lesions. No contractures.  Functional ROM: Unrestricted ROM                  Functional ROM: Pain restricted ROM for hip joint          Muscle Tone/Strength: Functionally intact.  No obvious neuro-muscular anomalies detected.  Muscle Tone/Strength: Functionally intact. No obvious neuro-muscular anomalies detected.  Sensory (Neurological): Neurogenic pain pattern        Sensory (Neurological): Musculoskeletal pain pattern        DTR: Patellar: deferred today Achilles: deferred today Plantar: deferred today  DTR: Patellar: deferred today Achilles: deferred today Plantar: deferred today  Palpation: No palpable anomalies  Palpation: No palpable anomalies    Assessment   Status Diagnosis  Persistent Persistent Persistent 1. Post herpetic neuralgia   2. Primary osteoarthritis of left hip   3. Left hip pain   4. Lumbar facet arthropathy   5. Chronic pain syndrome        Plan of Care    Autumn [name] has a current medication list which includes the following long-term medication(s): amlodipine, atenolol, atorvastatin, ipratropium-albuterol, losartan, sertraline, and fluticasone.  1. Post herpetic neuralgia - NEUROLYSIS; Future  2. Primary osteoarthritis of left hip  3. Left hip pain  4. Lumbar facet arthropathy -  HYDROcodone-acetaminophen (NORCO) 10-325 MG tablet; Take 1 tablet by mouth every 12 (twelve) hours as needed for up to 15 days.  Dispense: 30 tablet; Refill: 0  5. Chronic pain syndrome - HYDROcodone-acetaminophen (NORCO) 10-325 MG tablet; Take 1 tablet by mouth every 12 (twelve) hours as needed for up to 15 days.  Dispense: 30 tablet; Refill: 0 - NEUROLYSIS; Future  Discussed Qutenza for postherpetic neuralgia.  She has tried and failed gabapentin and Lyrica in the past.  Unable to tolerate Cymbalta due to side effects.   Pharmacotherapy (Medications Ordered): Meds ordered this encounter  Medications   HYDROcodone-acetaminophen (NORCO) 10-325 MG tablet    Sig: Take 1 tablet by mouth every 12 (twelve) hours as needed for up to 15 days.    Dispense:  30 tablet    Refill:  0    Chronic Pain: STOP Act (Not applicable) Fill 1 day early if closed on refill date. Avoid benzodiazepines within 8 hours of opioids   Left hip intra-articular steroid injection as needed  Follow-up plan:   Return in about 4 weeks (around 10/12/2022) for Qutenza for PHN.    Recent Visits Date Type Provider Dept  08/11/22 Office Visit Edward Jolly, MD [facility]  07/26/22 Procedure visit Edward Jolly, MD [facility]  06/22/22 Office Visit Edward Jolly, MD [facility]  Showing recent visits within past 90 days and meeting all other requirements Today's Visits Date Type Provider Dept  09/14/22 Office Visit Edward Jolly, MD [facility]  Showing today's visits and meeting all other requirements Future Appointments No visits were found meeting these conditions. Showing future appointments within next 90 days and meeting all other requirements  I discussed the assessment and treatment plan with the patient. The patient was provided an opportunity to ask questions and all were answered. The patient agreed with the plan and demonstrated an understanding of the  instructions.  Patient advised to call back or seek an in-person evaluation if the symptoms or condition worsens.  Duration of encounter: .  Note by: Edward Jolly, MD Date: 09/14/2022; Time: 3:44 PM

## 2022-09-14 NOTE — Progress Notes (Signed)
Nursing Pain Medication Assessment:  Safety precautions to be maintained throughout the outpatient stay will include: orient to surroundings, keep bed in low position, maintain call bell within reach at all times, provide assistance with transfer out of bed and ambulation.  Medication Inspection Compliance: Pill count conducted under aseptic conditions, in front of the patient. Neither the pills nor the bottle was removed from the patient's sight at any time. Once count was completed pills were immediately returned to the patient in their original bottle.  Medication: Hydrocodone/APAP Pill/Patch Count:  37 of 60 pills remain Pill/Patch Appearance: Markings consistent with prescribed medication Bottle Appearance: Standard pharmacy container. Clearly labeled. Filled Date: 05 / 07 / 2024 Last Medication intake:  Today

## 2022-09-16 ENCOUNTER — Encounter: Payer: Medicare PPO | Admitting: Student in an Organized Health Care Education/Training Program

## 2022-09-22 NOTE — Progress Notes (Signed)
Agree with the details of the visit as noted by Elizabeth Walsh, NP.  C. Laura Kirstin Kugler, MD  PCCM 

## 2022-10-13 ENCOUNTER — Encounter: Payer: Self-pay | Admitting: Student in an Organized Health Care Education/Training Program

## 2022-10-13 ENCOUNTER — Ambulatory Visit
Payer: Medicare PPO | Attending: Student in an Organized Health Care Education/Training Program | Admitting: Student in an Organized Health Care Education/Training Program

## 2022-10-13 VITALS — BP 177/60 | HR 66 | Temp 97.2°F | Resp 16 | Ht 66.0 in | Wt 108.0 lb

## 2022-10-13 DIAGNOSIS — B0229 Other postherpetic nervous system involvement: Secondary | ICD-10-CM | POA: Insufficient documentation

## 2022-10-13 DIAGNOSIS — G894 Chronic pain syndrome: Secondary | ICD-10-CM | POA: Insufficient documentation

## 2022-10-13 MED ORDER — CAPSAICIN-CLEANSING GEL 8 % EX KIT
2.0000 | PACK | Freq: Once | CUTANEOUS | Status: AC
  Administered 2022-10-13: 2 via TOPICAL
  Filled 2022-10-13: qty 2

## 2022-10-13 NOTE — Progress Notes (Signed)
Safety precautions to be maintained throughout the outpatient stay will include: orient to surroundings, keep bed in low position, maintain call bell within reach at all times, provide assistance with transfer out of bed and ambulation.  

## 2022-10-13 NOTE — Progress Notes (Signed)
PROVIDER NOTE: Interpretation of information contained herein should be left to medically-trained personnel. Specific patient instructions are provided elsewhere under "Patient Instructions" section of medical record. This document was created in part using STT-dictation technology, any transcriptional errors that may result from this process are unintentional.  Patient: Autumn [name] Type: Established DOB: 10/18/1945 MRN: [id number] PCP: Enid Baas, MD  Service: Procedure DOS: 10/13/2022 Setting: Ambulatory Location: Ambulatory outpatient facility Delivery: Face-to-face Provider: Edward Jolly, MD Specialty: Interventional Pain Management Specialty designation: 09 Location: Outpatient facility Ref. Prov.: Enid Baas, MD       Interventional Therapy   Interventional Treatment:           Type: Qutenza Neurolysis #1  Laterality:  Left Area treated: left chest and posterior thoracic region Imaging Guidance: None Anesthesia/analgesia/anxiolysis/sedation: None required Medication (Right): Qutenza (capsaicin 8%) topical system Medication (Left): Qutenza (capsaicin 8%) topical system Date: 10/13/2022 Performed by: Edward Jolly, MD Rationale (medical necessity): procedure needed and proper for the treatment of [name]'s medical symptoms and needs. Indication: Neuropathic pain associated with Post-Herpetic Neuralgia (PHN) (ICD-10-CM: B02.29) severe enough to impact quality of life or function. 1. Post herpetic neuralgia   2. Chronic pain syndrome    NAS-11 Pain score:   Pre-procedure: 7 /10   Post-procedure: 7 /10     Position / Prep / Materials:  Position: Supine  Materials: Qutenza Kit  H&P (Pre-op Assessment):  Autumn [name] is a 77 y.o. (year old), female patient, seen today for interventional treatment. She  has a past surgical history that includes Abdominal hysterectomy; Dilation and curettage of uterus; Vulvar lesion removal; Diagnostic laparoscopy; Eye surgery;  Colonoscopy with propofol (N/A, 07/30/2019); and Breast excisional biopsy (Right). Autumn [name] has a current medication list which includes the following prescription(s): acetaminophen, albuterol, amlodipine, atenolol, atorvastatin, benzonatate, cetirizine, denosumab, trelegy ellipta, [START ON 11/15/2022] hydrocodone-acetaminophen, ipratropium-albuterol, losartan, mirtazapine, ondansetron, polyethylene glycol powder, prednisone, propylene glycol, sertraline, fluticasone, and romosozumab-aqqg. Her primarily concern today is the Abdominal Pain (R/t PHN)  Initial Vital Signs:  Pulse/HCG Rate: 69  Temp: (!) 96.6 F (35.9 C) Resp: 16 BP: (!) 173/81 SpO2: 100 % (2L O2 from home)  BMI: Estimated body mass index is 17.43 kg/m as calculated from the following:   Height as of this encounter: 5\' 6"  (1.676 m).   Weight as of this encounter: 108 lb (49 kg).  Risk Assessment: Allergies: Reviewed. She is allergic to ivp dye [iodinated contrast media].  Allergy Precautions: None required Coagulopathies: Reviewed. None identified.  Blood-thinner therapy: None at this time Active Infection(s): Reviewed. None identified. Autumn [name] is afebrile  Site Confirmation: Autumn [name] was asked to confirm the procedure and laterality before marking the site Procedure checklist: Completed Consent: Before the procedure and under the influence of no sedative(s), amnesic(s), or anxiolytics, the patient was informed of the treatment options, risks and possible complications. To fulfill our ethical and legal obligations, as recommended by the American Medical Association's Code of Ethics, I have informed the patient of my clinical impression; the nature and purpose of the treatment or procedure; the risks, benefits, and possible complications of the intervention; the alternatives, including doing nothing; the risk(s) and benefit(s) of the alternative treatment(s) or procedure(s); and the risk(s) and benefit(s) of doing  nothing. The patient was provided information about the general risks and possible complications associated with the procedure. These may include, but are not limited to: failure to achieve desired goals, infection, bleeding, organ or nerve damage, allergic reactions, paralysis, and death. In addition, the patient  was informed of those risks and complications associated to the procedure, such as failure to decrease pain; infection; bleeding; organ or nerve damage with subsequent damage to sensory, motor, and/or autonomic systems, resulting in permanent pain, numbness, and/or weakness of one or several areas of the body; allergic reactions; (i.e.: anaphylactic reaction); and/or death. Furthermore, the patient was informed of those risks and complications associated with the medications. These include, but are not limited to: allergic reactions (i.e.: anaphylactic or anaphylactoid reaction(s)); adrenal axis suppression; blood sugar elevation that in diabetics may result in ketoacidosis or comma; water retention that in patients with history of congestive heart failure may result in shortness of breath, pulmonary edema, and decompensation with resultant heart failure; weight gain; swelling or edema; medication-induced neural toxicity; particulate matter embolism and blood vessel occlusion with resultant organ, and/or nervous system infarction; and/or aseptic necrosis of one or more joints. Finally, the patient was informed that Medicine is not an exact science; therefore, there is also the possibility of unforeseen or unpredictable risks and/or possible complications that may result in a catastrophic outcome. The patient indicated having understood very clearly. We have given the patient no guarantees and we have made no promises. Enough time was given to the patient to ask questions, all of which were answered to the patient's satisfaction. Autumn [name] has indicated that she wanted to continue with the  procedure. Attestation: I, the ordering provider, attest that I have discussed with the patient the benefits, risks, side-effects, alternatives, likelihood of achieving goals, and potential problems during recovery for the procedure that I have provided informed consent. Date  Time: 10/13/2022  1:06 PM  Pre-Procedure Preparation:  Monitoring: As per clinic protocol. Respiration, ETCO2, SpO2, BP, heart rate and rhythm monitor placed and checked for adequate function Safety Precautions: Patient was assessed for positional comfort and pressure points before starting the procedure. Time-out: I initiated and conducted the "Time-out" before starting the procedure, as per protocol. The patient was asked to participate by confirming the accuracy of the "Time Out" information. Verification of the correct person, site, and procedure were performed and confirmed by me, the nursing staff, and the patient. "Time-out" conducted as per Joint Commission's Universal Protocol (UP.01.01.01). Time: 1325 Start Time: 1335 hrs.  Description/Narrative of Procedure:          Region: left chest wall and left posterior thoracic region Approach: Percutaneous  No./Series: Not applicable  Type: Percutaneous  Purpose: Therapeutic  Region: thoracic   Start Time: 1335 hrs.  Description of the Procedure: Protocol guidelines were followed. The patient was assisted into a comfortable position.  Informed consent was obtained in the patient monitored in the usual manner.  All questions were answered prior to the procedure.  They Qutenza patches were applied to the affected area and then covered with the wrap.  The Patient was kept under observation until the treatment was completed.  The patches were removed and the treated area was inspected.  Vitals:   10/13/22 1315 10/13/22 1446  BP: (!) 173/81 (!) 177/60  Pulse: 69 66  Resp: 16 16  Temp: (!) 96.6 F (35.9 C) (!) 97.2 F (36.2 C)  SpO2: 100% 100%  Weight: 108 lb (49 kg)    Height: 5\' 6"  (1.676 m)      End Time: 1444 hrs.  Imaging Guidance:          Type of Imaging Technique: None used Indication(s): N/A Exposure Time: No patient exposure Contrast: None used. Fluoroscopic Guidance: N/A Ultrasound Guidance: N/A Interpretation:  N/A  Post-operative Assessment:  Post-procedure Vital Signs:  Pulse/HCG Rate: 66  Temp: (!) 97.2 F (36.2 C) Resp: 16 BP: (!) 177/60 SpO2: 100 % (2L O2)  EBL: None  Complications: No immediate post-treatment complications observed by team, or reported by patient.  Note: The patient tolerated the entire procedure well. A repeat set of vitals were taken after the procedure and the patient was kept under observation following institutional policy, for this type of procedure. Post-procedural neurological assessment was performed, showing return to baseline, prior to discharge. The patient was provided with post-procedure discharge instructions, including a section on how to identify potential problems. Should any problems arise concerning this procedure, the patient was given instructions to immediately contact us, at any time, without hesitation. In any case, we plan to contact the patient by telephone for a follow-up status report regarding this interventional procedure.  Comments:  No additional relevant information.  Plan of Care (POC)   Medications ordered for procedure: Meds ordered this encounter  Medications   capsaicin topical system 8 % patch 2 patch   Medications administered: We administered capsaicin topical system.  See the medical record for exact dosing, route, and time of administration.  Follow-up plan:   Return in about 10 weeks (around 12/22/2022) for Post Procedure Evaluation, in person.      Recent Visits Date Type Provider Dept  09/14/22 Office Visit Edward Jolly, MD [facility]  08/11/22 Office Visit Edward Jolly, MD [facility]  07/26/22 Procedure visit Edward Jolly, MD  [facility]  Showing recent visits within past 90 days and meeting all other requirements Today's Visits Date Type Provider Dept  10/13/22 Procedure visit Edward Jolly, MD [facility]  Showing today's visits and meeting all other requirements Future Appointments Date Type Provider Dept  12/22/22 Appointment Edward Jolly, MD [facility]  Showing future appointments within next 90 days and meeting all other requirements  Disposition: Discharge home  Discharge (Date  Time): 10/13/2022; 1446 hrs.   Primary Care Physician: Enid Baas, MD Location: [facility] Note by: Edward Jolly, MD (TTS technology used. I apologize for any typographical errors that were not detected and corrected.) Date: 10/13/2022; Time: 2:55 PM  Disclaimer:  Medicine is not an Visual merchandiser. The only guarantee in medicine is that nothing is guaranteed. It is important to note that the decision to proceed with this intervention was based on the information collected from the patient. The Data and conclusions were drawn from the patient's questionnaire, the interview, and the physical examination. Because the information was provided in large part by the patient, it cannot be guaranteed that it has not been purposely or unconsciously manipulated. Every effort has been made to obtain as much relevant data as possible for this evaluation. It is important to note that the conclusions that lead to this procedure are derived in large part from the available data. Always take into account that the treatment will also be dependent on availability of resources and existing treatment guidelines, considered by other Pain Management Practitioners as being common knowledge and practice, at the time of the intervention. For Medico-Legal purposes, it is also important to point out that variation in procedural techniques and pharmacological choices are the acceptable norm. The  indications, contraindications, technique, and results of the above procedure should only be interpreted and judged by a Board-Certified Interventional Pain Specialist with extensive familiarity and expertise in the same exact procedure and technique.

## 2022-10-19 ENCOUNTER — Telehealth: Payer: Self-pay | Admitting: Student in an Organized Health Care Education/Training Program

## 2022-10-19 NOTE — Telephone Encounter (Signed)
Informed patient that Qutenza may cause a burning sensation. States it feels the same as it did before the Qutenza application. I instructed her to discuss with Dr. Cherylann Ratel at follow-up.

## 2022-10-19 NOTE — Telephone Encounter (Signed)
PT states since she had procedure done she has been having burning sensation feeling under her left breast. Please give patient a call. TY

## 2022-11-18 ENCOUNTER — Other Ambulatory Visit: Payer: Self-pay | Admitting: Neurosurgery

## 2022-11-18 DIAGNOSIS — M419 Scoliosis, unspecified: Secondary | ICD-10-CM

## 2022-11-20 ENCOUNTER — Encounter: Payer: Self-pay | Admitting: Pulmonary Disease

## 2022-11-20 ENCOUNTER — Other Ambulatory Visit: Payer: Self-pay | Admitting: Pulmonary Disease

## 2022-11-22 ENCOUNTER — Ambulatory Visit: Payer: Medicare PPO

## 2022-11-23 ENCOUNTER — Ambulatory Visit
Admission: RE | Admit: 2022-11-23 | Discharge: 2022-11-23 | Disposition: A | Discharge status: Home / Self Care | Payer: Medicare PPO | Source: Ambulatory Visit | Attending: Neurosurgery | Admitting: Neurosurgery

## 2022-11-23 DIAGNOSIS — M419 Scoliosis, unspecified: Secondary | ICD-10-CM | POA: Insufficient documentation

## 2022-12-10 ENCOUNTER — Other Ambulatory Visit: Payer: Self-pay | Admitting: Pulmonary Disease

## 2022-12-15 ENCOUNTER — Other Ambulatory Visit: Payer: Self-pay | Admitting: Pulmonary Disease

## 2022-12-22 ENCOUNTER — Ambulatory Visit: Payer: Medicare PPO | Admitting: Student in an Organized Health Care Education/Training Program

## 2022-12-28 ENCOUNTER — Ambulatory Visit: Payer: Medicare PPO | Admitting: Student in an Organized Health Care Education/Training Program

## 2022-12-30 ENCOUNTER — Ambulatory Visit
Payer: Medicare PPO | Attending: Student in an Organized Health Care Education/Training Program | Admitting: Student in an Organized Health Care Education/Training Program

## 2022-12-30 DIAGNOSIS — M25552 Pain in left hip: Secondary | ICD-10-CM | POA: Diagnosis not present

## 2022-12-30 DIAGNOSIS — M47816 Spondylosis without myelopathy or radiculopathy, lumbar region: Secondary | ICD-10-CM | POA: Diagnosis not present

## 2022-12-30 DIAGNOSIS — M1612 Unilateral primary osteoarthritis, left hip: Secondary | ICD-10-CM

## 2022-12-30 DIAGNOSIS — G894 Chronic pain syndrome: Secondary | ICD-10-CM | POA: Diagnosis not present

## 2022-12-30 DIAGNOSIS — B0229 Other postherpetic nervous system involvement: Secondary | ICD-10-CM

## 2022-12-30 MED ORDER — HYDROCODONE-ACETAMINOPHEN 10-325 MG PO TABS: 1.0000 | ORAL_TABLET | Freq: Every day | ORAL | 0 refills | Status: AC | PRN

## 2022-12-30 NOTE — Progress Notes (Signed)
Patient: Autumn [name]  Service Category: E/M  Provider: Edward Jolly, MD  DOB: [birth year]  DOS: 12/30/2022  Location: Office  MRN: [id number]  Setting: Ambulatory outpatient  Referring Provider: Enid Baas, MD  Type: Established Patient  Specialty: Interventional Pain Management  PCP: Enid Baas, MD  Location: Remote location  Delivery: TeleHealth     Virtual Encounter - Pain Management PROVIDER NOTE: Information contained herein reflects review and annotations entered in association with encounter. Interpretation of such information and data should be left to medically-trained personnel. Information provided to patient can be located elsewhere in the medical record under "Patient Instructions". Document created using STT-dictation technology, any transcriptional errors that may result from process are unintentional.    Contact & Pharmacy Preferred: [phone] Home: [phone] (home) Mobile: [phone] (mobile) E-mail: [email or web address]  [phone] - [place], [place] - [address] [place] [address] Phone: [phone] Fax: [phone]  [id number] [place], [place] - [address] [place] [address] Phone: [phone] Fax: [phone]  [facility][id number] [place], [place] - [address] AT [facility] OF [address] [facility] [address] [place] [address] Phone: [phone] Fax: [phone]  [id number] [address] [place] - [address] [place] [place]. [place]. [place] [address] Phone: [phone] Fax: [phone]  [id number] - [place],  - [address] [place] [address] AT [facility] [address] [place] [address] [place] [address] Phone: [phone] Fax: [phone]  [facility][id number], [place] - [address] AT [facility] [address] [facility] [place] [address] Phone: [phone] Fax:  [phone]   Pre-screening  [name] offered "in-person" vs "virtual" encounter. She indicated preferring virtual for this encounter.   Reason COVID-19*  Social distancing based on CDC and AMA recommendations.   I contacted Johna Roles on 12/30/2022 via telephone.      I clearly identified myself as Edward Jolly, MD. I verified that I was speaking with the correct person using two identifiers (Name: MISHELE WEIK, and date of birth: 12-30-1945).  Consent I sought verbal advanced consent from Johna Roles for virtual visit interactions. I informed Ms. Bresnahan of possible security and privacy concerns, risks, and limitations associated with providing "not-in-person" medical evaluation and management services. I also informed Ms. Kildow of the availability of "in-person" appointments. Finally, I informed her that there would be a charge for the virtual visit and that she could be  personally, fully or partially, financially responsible for it. Ms. Hittinger expressed understanding and agreed to proceed.   Historic Elements   Ms. HARLIN VANDERKOOI is a 77 y.o. year old, female patient evaluated today after our last contact on 10/19/2022. Ms. Lui  has a past medical history of Allergy, Anemia, Arthritis, COPD (chronic obstructive pulmonary disease) (HCC), Glaucoma, H/O uvulectomy, Hypertension, Osteoporosis, Ovarian cancer (HCC), Postherpetic neuralgia, Pulmonary sarcoidosis (HCC), and Sarcoid. She also  has a past surgical history that includes Abdominal hysterectomy; Dilation and curettage of uterus; Vulvar lesion removal; Diagnostic laparoscopy; Eye surgery; Colonoscopy with propofol (N/A, 07/30/2019); and Breast excisional biopsy (Right). Ms. Cadman has a current medication list which includes the following prescription(s): [START ON 01/11/2023] hydrocodone-acetaminophen, acetaminophen, albuterol, albuterol, amlodipine, atenolol, atorvastatin, benzonatate, cetirizine, denosumab, ipratropium-albuterol,  losartan, mirtazapine, ondansetron, polyethylene glycol powder, prednisone, propylene glycol, sertraline, and trelegy ellipta.  She  reports that she quit smoking about 36 years ago. Her smoking use included cigarettes. She started smoking about 56 years ago. She has never used smokeless tobacco. She reports that she does not drink alcohol and does not use drugs. Ms. Hertel is allergic to ivp dye [iodinated contrast media].  BMI: Estimated body mass index is 17.43 kg/m as calculated from the following:   Height as of 10/13/22: 5\' 6"  (1.676 m).   Weight as of 10/13/22: 108 lb (49 kg). Last encounter: 09/14/2022. Last procedure: 10/13/2022.  HPI  Today, she is being contacted for both, medication management and a post-procedure assessment.  Unfortunately, Qutenza was not helpful for paresthesias of bilateral feet. She will also need a refill of her hydrocodone.   Pharmacotherapy Assessment  Analgesic: Hydrocodone 5-10 mg daily prn  Monitoring: [place] PMP: PDMP reviewed during this encounter.       Pharmacotherapy: No side-effects or adverse reactions reported. Compliance: No problems identified. Effectiveness: Clinically acceptable.  UDS:  Summary  Date Value Ref Range Status  06/22/2022 Note  Final    Comment:    ==================================================================== [employer] Select 13 (MW) ==================================================================== Test                             Result       Flag       Units  Drug Present and Declared for Prescription Verification   Hydrocodone                    3026         EXPECTED   ng/mg creat   Hydromorphone                  148          EXPECTED   ng/mg creat   Dihydrocodeine                 357          EXPECTED   ng/mg creat   Norhydrocodone                 >3571        EXPECTED   ng/mg creat    Sources of hydrocodone include scheduled prescription medications.    Hydromorphone, dihydrocodeine and norhydrocodone are  expected    metabolites of hydrocodone. Hydromorphone and dihydrocodeine are    also available as scheduled prescription medications.  ==================================================================== Test                      Result    Flag   Units      Ref Range   Creatinine              140              mg/dL      >=16 ==================================================================== Declared Medications:  The flagging and interpretation on this report are based on the  following declared medications.  Unexpected results may arise from  inaccuracies in the declared medications.   **Note: The testing scope of this panel includes these medications:   Hydrocodone (Norco)   **Note: The testing scope of this panel does not include the  following reported medications:   Acetaminophen (Tylenol)  Acetaminophen (Norco)  Albuterol (Ventolin HFA)  Albuterol (Duoneb)  Amlodipine (Norvasc)  Atenolol (Tenormin)  Atorvastatin (Lipitor)  Cetirizine (Zyrtec)  Fluticasone (Flonase)  Fluticasone (Trelegy)  Ipratropium (Duoneb)  Losartan (Cozaar)  Methylprednisolone (Medrol)  Mirtazapine (Remeron)  Ondansetron (Zofran)  Polyethylene Glycol  Prednisone (Deltasone)  Romosozumab (Evenity)  Sertraline (Zoloft)  Umeclidinium (Trelegy)  Vilanterol (Trelegy) ==================================================================== For clinical consultation, please call [phone]. ====================================================================       Laboratory Chemistry Profile   Renal Lab Results  Component Value Date   BUN 29 (H) 06/26/2021   CREATININE 0.79 06/26/2021   GFRAA >60 02/13/2018   GFRNONAA >60 06/26/2021    Hepatic Lab Results  Component Value Date   AST 25 06/26/2021   ALT 23 06/26/2021   ALBUMIN 4.6 06/26/2021   ALKPHOS 42 06/26/2021   LIPASE 32 06/26/2021    Electrolytes Lab Results  Component Value Date   NA 138 06/26/2021   K 3.7 06/26/2021    CL 94 (L) 06/26/2021   CALCIUM 9.5 06/26/2021   MG 2.7 (H) 02/07/2018   PHOS 3.2 02/07/2018    Bone No results found for: "VD25OH", "VD125OH2TOT", "UJ8119JY7", "WG9562ZH0", "25OHVITD1", "25OHVITD2", "25OHVITD3", "TESTOFREE", "TESTOSTERONE"  Inflammation (CRP: Acute Phase) (ESR: Chronic Phase) Lab Results  Component Value Date   ESRSEDRATE 48 (H) 05/25/2011         Note: Above Lab results reviewed.  Imaging  MR LUMBAR SPINE WO CONTRAST CLINICAL DATA:  Chronic low back pain for 3 years. Pain radiates to the legs. History of scoliosis.  EXAM: MRI LUMBAR SPINE WITHOUT CONTRAST  TECHNIQUE: Multiplanar, multisequence MR imaging of the lumbar spine was performed. No intravenous contrast was administered.  COMPARISON:  05/30/2020  FINDINGS: Segmentation:  Standard.  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 grade 1 L3-4 anterolisthesis unchanged.  Vertebrae:  No fracture, evidence of discitis, or bone lesion.  Conus medullaris and cauda equina: Conus extends to the L2 level. Conus and cauda equina appear normal.  Paraspinal and other soft tissues: Negative.  Disc levels:  The visualized lower thoracic levels are unremarkable.  L1-L2: Small right asymmetric disc bulge. No spinal canal stenosis. Scoliotic curvature contributes to mild left neural foraminal stenosis.  L2-L3: Normal disc space and facet joints. No spinal canal stenosis. No neural foraminal stenosis.  L3-L4: Small right asymmetric disc bulge. No spinal canal stenosis. No neural foraminal stenosis.  L4-L5: Right-greater-than-left facet hypertrophy. Improved appearance of previously seen left foraminal disc extrusion, which is mostly involuted. Mild left lateral recess narrowing. No central spinal canal stenosis. No neural foraminal stenosis.  L5-S1: Small disc bulge. No spinal canal stenosis. No neural foraminal stenosis.  Visualized sacrum: Normal.  IMPRESSION: 1. Improved appearance of  previously seen left foraminal disc extrusion at L4-L5, which has mostly involuted. Mild left lateral recess narrowing at this level. 2. Mild left L1-L2 neural foraminal stenosis due to combination of disc bulge and scoliotic curvature. 3. 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  Electronically Signed   By: Deatra Robinson M.D.   On: 12/06/2022 13:23  Assessment  The primary encounter diagnosis was Chronic pain syndrome. Diagnoses of Lumbar facet arthropathy, Primary osteoarthritis of left hip, Left hip pain, Post herpetic neuralgia, and Spondylosis without myelopathy or radiculopathy, lumbar region were also pertinent to this visit.  Plan of Care  Problem-specific:  No problem-specific Assessment & Plan notes found for this encounter.  Ms. CAMILIA BROUSSARD has a current medication list which includes the following long-term medication(s): albuterol, albuterol, amlodipine, atenolol, atorvastatin, ipratropium-albuterol, losartan, and sertraline.  Pharmacotherapy (Medications Ordered): Meds ordered this encounter  Medications   HYDROcodone-acetaminophen (NORCO) 10-325 MG tablet    Sig: Take 1 tablet by mouth daily as needed for  severe pain. Must last 30 days.    Dispense:  30 tablet    Refill:  0    Chronic Pain: STOP Act (Not applicable) Fill 1 day early if closed on refill date. Avoid benzodiazepines within 8 hours of opioids      Follow-up plan:   Return in about 7 weeks (around 02/17/2023) for MM, F2F.      Recent Visits Date Type Provider Dept  10/13/22 Procedure visit Edward Jolly, MD [facility]  Showing recent visits within past 90 days and meeting all other requirements Today's Visits Date Type Provider Dept  12/30/22 Office Visit Edward Jolly, MD [facility]  Showing today's visits and meeting all other requirements Future Appointments No visits were found meeting these conditions. Showing future appointments within next 90 days and meeting all other  requirements  I discussed the assessment and treatment plan with the patient. The patient was provided an opportunity to ask questions and all were answered. The patient agreed with the plan and demonstrated an understanding of the instructions.  Patient advised to call back or seek an in-person evaluation if the symptoms or condition worsens.  Duration of encounter: .  Note by: Edward Jolly, MD Date: 12/30/2022; Time: 3:06 PM

## 2023-01-10 ENCOUNTER — Telehealth: Payer: Self-pay | Admitting: Pulmonary Disease

## 2023-01-10 ENCOUNTER — Other Ambulatory Visit: Payer: Self-pay | Admitting: Primary Care

## 2023-01-10 MED ORDER — BENZONATATE 100 MG PO CAPS: 100.0000 mg | ORAL_CAPSULE | Freq: Three times a day (TID) | ORAL | 0 refills | Status: DC | PRN

## 2023-01-10 NOTE — Telephone Encounter (Signed)
Patient needs refill of benzonatate.   [facility]acy [facility] [place]

## 2023-01-10 NOTE — Telephone Encounter (Signed)
Tessalon has been sent to preferred pharmacy.  Patient is aware and voiced her understanding.  Nothing further needed

## 2023-03-01 ENCOUNTER — Encounter: Payer: Self-pay | Admitting: Student in an Organized Health Care Education/Training Program

## 2023-03-01 ENCOUNTER — Ambulatory Visit
Payer: Medicare PPO | Attending: Student in an Organized Health Care Education/Training Program | Admitting: Student in an Organized Health Care Education/Training Program

## 2023-03-01 VITALS — BP 169/69 | HR 73 | Temp 99.5°F | Resp 17 | Ht 66.0 in | Wt 110.0 lb

## 2023-03-01 DIAGNOSIS — M47816 Spondylosis without myelopathy or radiculopathy, lumbar region: Secondary | ICD-10-CM | POA: Insufficient documentation

## 2023-03-01 DIAGNOSIS — G894 Chronic pain syndrome: Secondary | ICD-10-CM | POA: Diagnosis present

## 2023-03-01 MED ORDER — HYDROCODONE-ACETAMINOPHEN 5-325 MG PO TABS: 1.0000 | ORAL_TABLET | Freq: Three times a day (TID) | ORAL | 0 refills | Status: AC | PRN

## 2023-03-01 NOTE — Progress Notes (Signed)
PROVIDER NOTE: Information contained herein reflects review and annotations entered in association with encounter. Interpretation of such information and data should be left to medically-trained personnel. Information provided to patient can be located elsewhere in the medical record under "Patient Instructions". Document created using STT-dictation technology, any transcriptional errors that may result from process are unintentional.    Patient: Autumn [name]  Service Category: E/M  Provider: Edward Jolly, MD  DOB: 06-05-1945  DOS: 03/01/2023  Referring Provider: Enid Baas, MD  MRN: [id number]  Specialty: Interventional Pain Management  PCP: Enid Baas, MD  Type: Established Patient  Setting: Ambulatory outpatient    Location: Office  Delivery: Face-to-face     HPI  Ms. KIMALEE REASNER, a 77 y.o. year old female, is here today because of her Chronic pain syndrome [G89.4]. Ms. Bon primary complain today is Pain (PHN left side) and Back Pain (lower)  Pertinent problems: Ms. Gotto has COPD (chronic obstructive pulmonary disease) (HCC); Post herpetic neuralgia; Pulmonary sarcoidosis (HCC); Chronic pain syndrome; Thoracic radiculopathy; and Lumbar facet arthropathy on their pertinent problem list. Pain Assessment: Severity of Chronic pain is reported as a 5 /10. Location: Abdomen Left (left flank)/ . Onset: More than a month ago. Quality: Aching. Timing: Constant. Modifying factor(s): meds. Vitals:  height is 5\' 6"  (1.676 m) and weight is 110 lb (49.9 kg). Her temporal temperature is 99.5 F (37.5 C). Her blood pressure is 169/69 (abnormal) and her pulse is 73. Her respiration is 17 and oxygen saturation is 97%.  BMI: Estimated body mass index is 17.75 kg/m as calculated from the following:   Height as of this encounter: 5\' 6"  (1.676 m).   Weight as of this encounter: 110 lb (49.9 kg). Last encounter: 12/30/2022. Last procedure: 10/13/2022.  Reason for encounter: medication  management.   Patient presents today for medication management.  She is also experiencing increased axial low back pain related to lumbar facet arthropathy and spondylosis.  She is status post bilateral L3, L4, L5 lumbar medial branch nerve block 05/12/2022 that provided her with approximately 80% pain relief for many months.  Given increased of low back pain, we discussed repeating diagnostic lumbar facet medial branch nerve block #2 and then consider lumbar radiofrequency ablation thereafter.  Pharmacotherapy Assessment  Analgesic: Hydrocodone 5 mg TID PRN  Monitoring: [place] PMP: PDMP reviewed during this encounter.       Pharmacotherapy: No side-effects or adverse reactions reported. Compliance: No problems identified. Effectiveness: Clinically acceptable.  Nonah Mattes, RN  03/01/2023  3:08 PM  Sign when Signing Visit Nursing Pain Medication Assessment:  Safety precautions to be maintained throughout the outpatient stay will include: orient to surroundings, keep bed in low position, maintain call bell within reach at all times, provide assistance with transfer out of bed and ambulation.  Medication Inspection Compliance: Pill count conducted under aseptic conditions, in front of the patient. Neither the pills nor the bottle was removed from the patient's sight at any time. Once count was completed pills were immediately returned to the patient in their original bottle.  Medication: Hydrocodone/APAP Pill/Patch Count:  0 of 30 pills remain Pill/Patch Appearance: Markings consistent with prescribed medication Bottle Appearance: Standard pharmacy container. Clearly labeled. Filled Date: 3 / 8 / 2024 Last Medication intake:   pt states she ran out of hydrocodone/APAP 2 weeks ago    No results found for: "CBDTHCR" No results found for: "D8THCCBX" No results found for: "D9THCCBX"  UDS:  Summary  Date Value Ref Range Status  06/22/2022  Note  Final    Comment:     ==================================================================== [facility] 13 (MW) ==================================================================== Test                             Result       Flag       Units  Drug Present and Declared for Prescription Verification   Hydrocodone                    3026         EXPECTED   ng/mg creat   Hydromorphone                  148          EXPECTED   ng/mg creat   Dihydrocodeine                 357          EXPECTED   ng/mg creat   Norhydrocodone                 >3571        EXPECTED   ng/mg creat    Sources of hydrocodone include scheduled prescription medications.    Hydromorphone, dihydrocodeine and norhydrocodone are expected    metabolites of hydrocodone. Hydromorphone and dihydrocodeine are    also available as scheduled prescription medications.  ==================================================================== Test                      Result    Flag   Units      Ref Range   Creatinine              140              mg/dL      >=82 ==================================================================== Declared Medications:  The flagging and interpretation on this report are based on the  following declared medications.  Unexpected results may arise from  inaccuracies in the declared medications.   **Note: The testing scope of this panel includes these medications:   Hydrocodone (Norco)   **Note: The testing scope of this panel does not include the  following reported medications:   Acetaminophen (Tylenol)  Acetaminophen (Norco)  Albuterol (Ventolin HFA)  Albuterol (Duoneb)  Amlodipine (Norvasc)  Atenolol (Tenormin)  Atorvastatin (Lipitor)  Cetirizine (Zyrtec)  Fluticasone (Flonase)  Fluticasone (Trelegy)  Ipratropium (Duoneb)  Losartan (Cozaar)  Methylprednisolone (Medrol)  Mirtazapine (Remeron)  Ondansetron (Zofran)  Polyethylene Glycol  Prednisone (Deltasone)  Romosozumab (Evenity)  Sertraline  (Zoloft)  Umeclidinium (Trelegy)  Vilanterol (Trelegy) ==================================================================== For clinical consultation, please call [phone]. ====================================================================       ROS  Constitutional: Denies any fever or chills Gastrointestinal: No reported hemesis, hematochezia, vomiting, or acute GI distress Musculoskeletal:  +low back pain Neurological: No reported episodes of acute onset apraxia, aphasia, dysarthria, agnosia, amnesia, paralysis, loss of coordination, or loss of consciousness  Medication Review  Fluticasone-Umeclidin-Vilant, HYDROcodone-acetaminophen, Propylene Glycol, acetaminophen, albuterol, amLODipine, atenolol, atorvastatin, benzonatate, cetirizine, denosumab, ipratropium-albuterol, losartan, mirtazapine, ondansetron, polyethylene glycol powder, predniSONE, and sertraline  History Review  Allergy: Ms. Eccleston is allergic to ivp dye [iodinated contrast media]. Drug: Ms. Kronenwetter  reports no history of drug use. Alcohol:  reports no history of alcohol use. Tobacco:  reports that she quit smoking about 36 years ago. Her smoking use included cigarettes. She started smoking about 56 years ago. She has never used smokeless tobacco. Social: Ms. Klemme  reports that she quit smoking about 36 years ago. Her smoking use included cigarettes. She started smoking about 56 years ago. She has never used smokeless tobacco. She reports that she does not drink alcohol and does not use drugs. Medical:  has a past medical history of Allergy, Anemia, Arthritis, COPD (chronic obstructive pulmonary disease) (HCC), Glaucoma, H/O uvulectomy, Hypertension, Osteoporosis, Ovarian cancer (HCC), Postherpetic neuralgia, Pulmonary sarcoidosis (HCC), and Sarcoid. Surgical: Ms. Pasquinelli  has a past surgical history that includes Abdominal hysterectomy; Dilation and curettage of uterus; Vulvar lesion removal; Diagnostic laparoscopy;  Eye surgery; Colonoscopy with propofol (N/A, 07/30/2019); and Breast excisional biopsy (Right). Family: family history includes Breast cancer (age of onset: 94) in her mother; Cancer in her mother; Hypertension in her father.  Laboratory Chemistry Profile   Renal Lab Results  Component Value Date   BUN 29 (H) 06/26/2021   CREATININE 0.79 06/26/2021   GFRAA >60 02/13/2018   GFRNONAA >60 06/26/2021    Hepatic Lab Results  Component Value Date   AST 25 06/26/2021   ALT 23 06/26/2021   ALBUMIN 4.6 06/26/2021   ALKPHOS 42 06/26/2021   LIPASE 32 06/26/2021    Electrolytes Lab Results  Component Value Date   NA 138 06/26/2021   K 3.7 06/26/2021   CL 94 (L) 06/26/2021   CALCIUM 9.5 06/26/2021   MG 2.7 (H) 02/07/2018   PHOS 3.2 02/07/2018    Bone No results found for: "VD25OH", "VD125OH2TOT", "MV7846NG2", "XB2841LK4", "25OHVITD1", "25OHVITD2", "25OHVITD3", "TESTOFREE", "TESTOSTERONE"  Inflammation (CRP: Acute Phase) (ESR: Chronic Phase) Lab Results  Component Value Date   ESRSEDRATE 48 (H) 05/25/2011         Note: Above Lab results reviewed.  Recent Imaging Review  MR LUMBAR SPINE WO CONTRAST CLINICAL DATA:  Chronic low back pain for 3 years. Pain radiates to the legs. History of scoliosis.  EXAM: MRI LUMBAR SPINE WITHOUT CONTRAST  TECHNIQUE: Multiplanar, multisequence MR imaging of the lumbar spine was performed. No intravenous contrast was administered.  COMPARISON:  05/30/2020  FINDINGS: Segmentation:  Standard.  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 grade 1 L3-4 anterolisthesis unchanged.  Vertebrae:  No fracture, evidence of discitis, or bone lesion.  Conus medullaris and cauda equina: Conus extends to the L2 level. Conus and cauda equina appear normal.  Paraspinal and other soft tissues: Negative.  Disc levels:  The visualized lower thoracic levels are unremarkable.  L1-L2: Small right asymmetric disc bulge. No spinal canal stenosis. Scoliotic  curvature contributes to mild left neural foraminal stenosis.  L2-L3: Normal disc space and facet joints. No spinal canal stenosis. No neural foraminal stenosis.  L3-L4: Small right asymmetric disc bulge. No spinal canal stenosis. No neural foraminal stenosis.  L4-L5: Right-greater-than-left facet hypertrophy. Improved appearance of previously seen left foraminal disc extrusion, which is mostly involuted. Mild left lateral recess narrowing. No central spinal canal stenosis. No neural foraminal stenosis.  L5-S1: Small disc bulge. No spinal canal stenosis. No neural foraminal stenosis.  Visualized sacrum: Normal.  IMPRESSION: 1. Improved appearance of previously seen left foraminal disc extrusion at L4-L5, which has mostly involuted. Mild left lateral recess narrowing at this level. 2. Mild left L1-L2 neural foraminal stenosis due to combination of disc bulge and scoliotic curvature. 3. 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  Electronically Signed   By: Deatra Robinson M.D.   On: 12/06/2022 13:23 Note: Reviewed        Physical Exam  General appearance: Well nourished, well developed, and well hydrated. In no apparent acute distress  Mental status: Alert, oriented x 3 (person, place, & time)       Respiratory: No evidence of acute respiratory distress Eyes: PERLA Vitals: BP (!) 169/69   Pulse 73   Temp 99.5 F (37.5 C) (Temporal)   Resp 17 Comment: on 2L O2 from home  Ht 5\' 6"  (1.676 m)   Wt 110 lb (49.9 kg)   SpO2 97%   BMI 17.75 kg/m  BMI: Estimated body mass index is 17.75 kg/m as calculated from the following:   Height as of this encounter: 5\' 6"  (1.676 m).   Weight as of this encounter: 110 lb (49.9 kg). Ideal: Ideal body weight: 59.3 kg (130 lb 11.7 oz)  Low back pain, worse with lumbar extension and facet loading  Assessment   Diagnosis Status  1. Chronic pain syndrome   2. Lumbar facet arthropathy   3. Spondylosis without myelopathy or radiculopathy, lumbar  region    Controlled Having a Flare-up Having a Flare-up   Updated Problems: Problem  Lumbar Facet Arthropathy    Plan of Care   Ms. FOUA MANTZ has a current medication list which includes the following long-term medication(s): albuterol, albuterol, amlodipine, atenolol, atorvastatin, ipratropium-albuterol, losartan, and sertraline.  Pharmacotherapy (Medications Ordered): Meds ordered this encounter  Medications   HYDROcodone-acetaminophen (NORCO/VICODIN) 5-325 MG tablet    Sig: Take 1 tablet by mouth every 8 (eight) hours as needed for severe pain (pain score 7-10). Must last 30 days.    Dispense:  90 tablet    Refill:  0    Chronic Pain: STOP Act (Not applicable) Fill 1 day early if closed on refill date. Avoid benzodiazepines within 8 hours of opioids   HYDROcodone-acetaminophen (NORCO/VICODIN) 5-325 MG tablet    Sig: Take 1 tablet by mouth every 8 (eight) hours as needed for severe pain (pain score 7-10). Must last 30 days.    Dispense:  90 tablet    Refill:  0    Chronic Pain: STOP Act (Not applicable) Fill 1 day early if closed on refill date. Avoid benzodiazepines within 8 hours of opioids   HYDROcodone-acetaminophen (NORCO/VICODIN) 5-325 MG tablet    Sig: Take 1 tablet by mouth every 8 (eight) hours as needed for severe pain (pain score 7-10). Must last 30 days.    Dispense:  90 tablet    Refill:  0    Chronic Pain: STOP Act (Not applicable) Fill 1 day early if closed on refill date. Avoid benzodiazepines within 8 hours of opioids   Orders:  Orders Placed This Encounter  Procedures   LUMBAR FACET(MEDIAL BRANCH NERVE BLOCK) MBNB    Standing Status:   Future    Standing Expiration Date:   06/01/2023    Scheduling Instructions:     Procedure: Lumbar facet block (AKA.: Lumbosacral medial branch nerve block)     Side: Bilateral     Level:  L3-4, L4-5, Facets (L3, L4, L5, Medial Branch)     Sedation: Patient's choice.     Timeframe: ASAA    Order Specific Question:    Where will this procedure be performed?    Answer:   [facility]   Follow-up plan:   Return in about 20 days (around 03/21/2023) for B/L L3, 4, 5 MBNB #2, in clinic [facility].      Recent Visits Date Type Provider Dept  12/30/22 Office Visit Edward Jolly, MD [facility]  Showing recent visits within past 90 days and meeting all other requirements Today's Visits Date  Type Provider Dept  03/01/23 Office Visit Edward Jolly, MD [facility]  Showing today's visits and meeting all other requirements Future Appointments No visits were found meeting these conditions. Showing future appointments within next 90 days and meeting all other requirements  I discussed the assessment and treatment plan with the patient. The patient was provided an opportunity to ask questions and all were answered. The patient agreed with the plan and demonstrated an understanding of the instructions.  Patient advised to call back or seek an in-person evaluation if the symptoms or condition worsens.  Duration of encounter: .  Total time on encounter, as per AMA guidelines included both the face-to-face and non-face-to-face time personally spent by the physician and/or other qualified health care professional(s) on the day of the encounter (includes time in activities that require the physician or other qualified health care professional and does not include time in activities normally performed by clinical staff). Physician's time may include the following activities when performed: Preparing to see the patient (e.g., pre-charting review of records, searching for previously ordered imaging, lab work, and nerve conduction tests) Review of prior analgesic pharmacotherapies. Reviewing PMP Interpreting ordered tests (e.g., lab work, imaging, nerve conduction tests) Performing post-procedure evaluations, including interpretation of diagnostic procedures Obtaining and/or reviewing separately  obtained history Performing a medically appropriate examination and/or evaluation Counseling and educating the patient/family/caregiver Ordering medications, tests, or procedures Referring and communicating with other health care professionals (when not separately reported) Documenting clinical information in the electronic or other health record Independently interpreting results (not separately reported) and communicating results to the patient/ family/caregiver Care coordination (not separately reported)  Note by: Edward Jolly, MD Date: 03/01/2023; Time: 3:29 PM

## 2023-03-01 NOTE — Progress Notes (Signed)
Nursing Pain Medication Assessment:  Safety precautions to be maintained throughout the outpatient stay will include: orient to surroundings, keep bed in low position, maintain call bell within reach at all times, provide assistance with transfer out of bed and ambulation.  Medication Inspection Compliance: Pill count conducted under aseptic conditions, in front of the patient. Neither the pills nor the bottle was removed from the patient's sight at any time. Once count was completed pills were immediately returned to the patient in their original bottle.  Medication: Hydrocodone/APAP Pill/Patch Count:  0 of 30 pills remain Pill/Patch Appearance: Markings consistent with prescribed medication Bottle Appearance: Standard pharmacy container. Clearly labeled. Filled Date: 15 / 8 / 2024 Last Medication intake:   pt states she ran out of hydrocodone/APAP 2 weeks ago

## 2023-03-01 NOTE — Patient Instructions (Signed)
______________________________________________________________________    General Risks and Possible Complications  Patient Responsibilities: It is important that you read this as it is part of your informed consent. It is our duty to inform you of the risks and possible complications associated with treatments offered to you. It is your responsibility as a patient to read this and to ask questions about anything that is not clear or that you believe was not covered in this document.  Patient's Rights: You have the right to refuse treatment. You also have the right to change your mind, even after initially having agreed to have the treatment done. However, under this last option, if you wait until the last second to change your mind, you may be charged for the materials used up to that point.  Introduction: Medicine is not an Visual merchandiser. Everything in Medicine, including the lack of treatment(s), carries the potential for danger, harm, or loss (which is by definition: Risk). In Medicine, a complication is a secondary problem, condition, or disease that can aggravate an already existing one. All treatments carry the risk of possible complications. The fact that a side effects or complications occurs, does not imply that the treatment was conducted incorrectly. It must be clearly understood that these can happen even when everything is done following the highest safety standards.  No treatment: You can choose not to proceed with the proposed treatment alternative. The "PRO(s)" would include: avoiding the risk of complications associated with the therapy. The "CON(s)" would include: not getting any of the treatment benefits. These benefits fall under one of three categories: diagnostic; therapeutic; and/or palliative. Diagnostic benefits include: getting information which can ultimately lead to improvement of the disease or symptom(s). Therapeutic benefits are those associated with the successful  treatment of the disease. Finally, palliative benefits are those related to the decrease of the primary symptoms, without necessarily curing the condition (example: decreasing the pain from a flare-up of a chronic condition, such as incurable terminal cancer).  General Risks and Complications: These are associated to most interventional treatments. They can occur alone, or in combination. They fall under one of the following six (6) categories: no benefit or worsening of symptoms; bleeding; infection; nerve damage; allergic reactions; and/or death. No benefits or worsening of symptoms: In Medicine there are no guarantees, only probabilities. No healthcare provider can ever guarantee that a medical treatment will work, they can only state the probability that it may. Furthermore, there is always the possibility that the condition may worsen, either directly, or indirectly, as a consequence of the treatment. Bleeding: This is more common if the patient is taking a blood thinner, either prescription or over the counter (example: Goody Powders, Fish oil, Aspirin, Garlic, etc.), or if suffering a condition associated with impaired coagulation (example: Hemophilia, cirrhosis of the liver, low platelet counts, etc.). However, even if you do not have one on these, it can still happen. If you have any of these conditions, or take one of these drugs, make sure to notify your treating physician. Infection: This is more common in patients with a compromised immune system, either due to disease (example: diabetes, cancer, human immunodeficiency virus [HIV], etc.), or due to medications or treatments (example: therapies used to treat cancer and rheumatological diseases). However, even if you do not have one on these, it can still happen. If you have any of these conditions, or take one of these drugs, make sure to notify your treating physician. Nerve Damage: This is more common when the treatment is  an invasive one, but it  can also happen with the use of medications, such as those used in the treatment of cancer. The damage can occur to small secondary nerves, or to large primary ones, such as those in the spinal cord and brain. This damage may be temporary or permanent and it may lead to impairments that can range from temporary numbness to permanent paralysis and/or brain death. Allergic Reactions: Any time a substance or material comes in contact with our body, there is the possibility of an allergic reaction. These can range from a mild skin rash (contact dermatitis) to a severe systemic reaction (anaphylactic reaction), which can result in death. Death: In general, any medical intervention can result in death, most of the time due to an unforeseen complication. ______________________________________________________________________      ______________________________________________________________________    Preparing for your procedure  Appointments: If you think you may not be able to keep your appointment, call 24-48 hours in advance to cancel. We need time to make it available to others.  During your procedure appointment there will be: No Prescription Refills. No disability issues to discussed. No medication changes or discussions.  Instructions: Food intake: Avoid eating anything solid for at least 8 hours prior to your procedure. Clear liquid intake: You may take clear liquids such as water up to 2 hours prior to your procedure. (No carbonated drinks. No soda.) Transportation: Unless otherwise stated by your physician, bring a driver. (Driver cannot be a Market researcher, Pharmacist, community, or any other form of public transportation.) Morning Medicines: Except for blood thinners, take all of your other morning medications with a sip of water. Make sure to take your heart and blood pressure medicines. If your blood pressure's lower number is above 100, the case will be rescheduled. Blood thinners: Make sure to stop your blood  thinners as instructed.  If you take a blood thinner, but were not instructed to stop it, call our office [phone] and ask to talk to a nurse. Not stopping a blood thinner prior to certain procedures could lead to serious complications. Diabetics on insulin: Notify the staff so that you can be scheduled 1st case in the morning. If your diabetes requires high dose insulin, take only  of your normal insulin dose the morning of the procedure and notify the staff that you have done so. Preventing infections: Shower with an antibacterial soap the morning of your procedure.  Build-up your immune system: Take 1000 mg of Vitamin C with every meal (3 times a day) the day prior to your procedure. Antibiotics: Inform the nursing staff if you are taking any antibiotics or if you have any conditions that may require antibiotics prior to procedures. (Example: recent joint implants)   Pregnancy: If you are pregnant make sure to notify the nursing staff. Not doing so may result in injury to the fetus, including death.  Sickness: If you have a cold, fever, or any active infections, call and cancel or reschedule your procedure. Receiving steroids while having an infection may result in complications. Arrival: You must be in the facility at least 30 minutes prior to your scheduled procedure. Tardiness: Your scheduled time is also the cutoff time. If you do not arrive at least 15 minutes prior to your procedure, you will be rescheduled.  Children: Do not bring any children with you. Make arrangements to keep them home. Dress appropriately: There is always a possibility that your clothing may get soiled. Avoid long dresses. Valuables: Do not bring any jewelry  or valuables.  Reasons to call and reschedule or cancel your procedure: (Following these recommendations will minimize the risk of a serious complication.) Surgeries: Avoid having procedures within 2 weeks of any surgery. (Avoid for 2 weeks before or after any  surgery). Flu Shots: Avoid having procedures within 2 weeks of a flu shots or . (Avoid for 2 weeks before or after immunizations). Barium: Avoid having a procedure within 7-10 days after having had a radiological study involving the use of radiological contrast. (Myelograms, Barium swallow or enema study). Heart attacks: Avoid any elective procedures or surgeries for the initial 6 months after a "Myocardial Infarction" (Heart Attack). Blood thinners: It is imperative that you stop these medications before procedures. Let us know if you if you take any blood thinner.  Infection: Avoid procedures during or within two weeks of an infection (including chest colds or gastrointestinal problems). Symptoms associated with infections include: Localized redness, fever, chills, night sweats or profuse sweating, burning sensation when voiding, cough, congestion, stuffiness, runny nose, sore throat, diarrhea, nausea, vomiting, cold or Flu symptoms, recent or current infections. It is specially important if the infection is over the area that we intend to treat. Heart and lung problems: Symptoms that may suggest an active cardiopulmonary problem include: cough, chest pain, breathing difficulties or shortness of breath, dizziness, ankle swelling, uncontrolled high or unusually low blood pressure, and/or palpitations. If you are experiencing any of these symptoms, cancel your procedure and contact your primary care physician for an evaluation.  Remember:  Regular Business hours are:  Monday to Thursday 8:00 AM to 4:00 PM  Provider's Schedule: Delano Metz, MD:  Procedure days: Tuesday and Thursday 7:30 AM to 4:00 PM  Edward Jolly, MD:  Procedure days: Monday and Wednesday 7:30 AM to 4:00 PM Last  Updated: 11/30/2022 ______________________________________________________________________     Facet Blocks Patient Information  Description: The facets are joints in the spine between the vertebrae.  Like any  joints in the body, facets can become irritated and painful.  Arthritis can also effect the facets.  By injecting steroids and local anesthetic in and around these joints, we can temporarily block the nerve supply to them.  Steroids act directly on irritated nerves and tissues to reduce selling and inflammation which often leads to decreased pain.  Facet blocks may be done anywhere along the spine from the neck to the low back depending upon the location of your pain.   After numbing the skin with local anesthetic (like Novocaine), a small needle is passed onto the facet joints under x-ray guidance.  You may experience a sensation of pressure while this is being done.  The entire block usually lasts about 15-25 minutes.   Conditions which may be treated by facet blocks:  Low back/buttock pain Neck/shoulder pain Certain types of headaches  Preparation for the injection:  Do not eat any solid food or dairy products within 8 hours of your appointment. You may drink clear liquid up to 3 hours before appointment.  Clear liquids include water, black coffee, juice or soda.  No milk or cream please. You may take your regular medication, including pain medications, with a sip of water before your appointment.  Diabetics should hold regular insulin (if taken separately) and take 1/2 normal NPH dose the morning of the procedure.  Carry some sugar containing items with you to your appointment. A driver must accompany you and be prepared to drive you home after your procedure. Bring all your current medications with you. An  IV may be inserted and sedation may be given at the discretion of the physician. A blood pressure cuff, EKG and other monitors will often be applied during the procedure.  Some patients may need to have extra oxygen administered for a short period. You will be asked to provide medical information, including your allergies and medications, prior to the procedure.  We must know immediately if  you are taking blood thinners (like Coumadin/Warfarin) or if you are allergic to IV iodine contrast (dye).  We must know if you could possible be pregnant.  Possible side-effects:  Bleeding from needle site Infection (rare, may require surgery) Nerve injury (rare) Numbness & tingling (temporary) Difficulty urinating (rare, temporary) Spinal headache (a headache worse with upright posture) Light-headedness (temporary) Pain at injection site (serveral days) Decreased blood pressure (rare, temporary) Weakness in arm/leg (temporary) Pressure sensation in back/neck (temporary)   Call if you experience:  Fever/chills associated with headache or increased back/neck pain Headache worsened by an upright position New onset, weakness or numbness of an extremity below the injection site Hives or difficulty breathing (go to the emergency room) Inflammation or drainage at the injection site(s) Severe back/neck pain greater than usual New symptoms which are concerning to you  Please note:  Although the local anesthetic injected can often make your back or neck feel good for several hours after the injection, the pain will likely return. It takes 3-7 days for steroids to work.  You may not notice any pain relief for at least one week.  If effective, we will often do a series of 2-3 injections spaced 3-6 weeks apart to maximally decrease your pain.  After the initial series, you may be a candidate for a more permanent nerve block of the facets.  If you have any questions, please call #[phone]) [phone] [facility]

## 2023-03-07 ENCOUNTER — Ambulatory Visit (INDEPENDENT_AMBULATORY_CARE_PROVIDER_SITE_OTHER): Payer: Medicare PPO | Admitting: Pulmonary Disease

## 2023-03-07 ENCOUNTER — Encounter: Payer: Self-pay | Admitting: Pulmonary Disease

## 2023-03-07 VITALS — BP 118/68 | HR 69 | Temp 97.3°F | Ht 66.0 in | Wt 109.2 lb

## 2023-03-07 DIAGNOSIS — I272 Pulmonary hypertension, unspecified: Secondary | ICD-10-CM

## 2023-03-07 DIAGNOSIS — J9611 Chronic respiratory failure with hypoxia: Secondary | ICD-10-CM

## 2023-03-07 DIAGNOSIS — D860 Sarcoidosis of lung: Secondary | ICD-10-CM | POA: Diagnosis not present

## 2023-03-07 DIAGNOSIS — I340 Nonrheumatic mitral (valve) insufficiency: Secondary | ICD-10-CM

## 2023-03-07 DIAGNOSIS — M419 Scoliosis, unspecified: Secondary | ICD-10-CM

## 2023-03-07 NOTE — Progress Notes (Unsigned)
Subjective:    Patient ID: Autumn [name] Roles, female    DOB: 1945/08/05, 77 y.o.   MRN: [id number]  Patient Care Team: Enid Baas, MD as PCP - General (Internal Medicine) Salena Saner, MD as Consulting Physician (Pulmonary Disease)  Chief Complaint  Patient presents with   Follow-up    No SOB, wheezing or cough.    BACKGROUND/INTERVAL:77 year old female, former smoker quit 1988.  Past medical history significant for pulmonary hypertension, COPD, pulmonary sarcoidosis, chronic respiratory failure, ovarian cancer.   HPI Discussed the use of AI scribe software for clinical note transcription with the patient, who gave verbal consent to proceed.  History of Present Illness   The patient, diagnosed with Sarcoidosis, COPD, Pulmonary hypertension reports generally stable breathing with occasional wheezing. She manages these episodes with albuterol, which she confirms is effective. She also utilizes supplemental oxygen, with which she reports no issues. She has received both the flu and RSV vaccines and is up-to-date. The patient is currently on Trelegy, which she affirms is working well for her. She also mentions attending a pain management clinic but does not elaborate on the nature or severity of her pain.   She does not endorse any other symptomatology today.  Does not feel overtly short of breath as long as she is wearing her oxygen.  No wheezing or cough reported.  No hemoptysis.  No fevers, chills or sweats.    DATA: Pulmonary function data obtained from [facility] done on 24 Aug 2021 are as follows: SPIROMETRY: FVC was 0.68 Liters, 26% of predicted FEV1 was 0.53 Liters, 27% of predicted FEV1 ratio was 78.26%, 101% of predicted FEF 25-75% Liters per second was 0.44, 25% of predicted DIFFUSION CAPACITY: DLCO was 7.14 mL/min/mmHg VA was 2.90 Liters DLCO/VA was 2.49 mL/min/mmHg/L LUNG VOLUMES: TLC was 3.43 Liters RV was 2.75 Liters Impression Spirometry is c/w  more so of restrictive TLC is mildly decreased DLCO is severely decreased, dlco/va is moderately decreased  Study interpreted by Dr. Ned Clines.   12/22/2021 PFTs [facility]: FEV1 0.61 L or 27% predicted, FVC 1.19 L or 39% predicted, FEV1/FVC 51% lung volumes moderately reduced diffusion capacity severely reduced.  Compared to prior study of 24 Aug 2021 has been some slight improvement on spirometric values but worsening of diffusion capacity. 01/13/2023 echocardiogram: LVEF 70 to 75%, hyperdynamic left ventricular function.  No wall motion abnormalities.  Moderate left ventricular hypertrophy, indeterminate diastolics.  The RV size is normal, there is severely increased right ventricular wall thickness, RV function is normal.  Mildly elevated pulmonary artery systolic pressure.  Estimated right ventricular systolic pressure is 40.2 mmHg.  Mild mitral regurgitation.  Mild to moderate pulmonic valve regurgitation.    Review of Systems A 10 point review of systems was performed and it is as noted above otherwise negative.   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   Depression, prolonged 04/07/2018   Chronic respiratory failure [facility])    Palliative care encounter    Protein-calorie malnutrition, severe 02/06/2018   Acute respiratory failure (HCC) 02/04/2018   COPD (chronic obstructive pulmonary disease) (HCC) 03/22/2017   Post herpetic neuralgia  07/30/2016   Hypercalcemia 02/17/2016   Chronic angle-closure glaucoma of both eyes, severe stage 01/20/2016   Age-related nuclear cataract of both eyes 12/17/2015    Pneumonia 11/17/2015   Long term current use of systemic steroids 02/14/2015   Pulmonary hypertension (HCC) 10/05/2013   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 1 pack lasted >2 weeks.  Substance Use Topics   Alcohol use: No    Allergies  Allergen Reactions   Ivp Dye [Iodinated Contrast Media] Hives and Itching    Current Meds  Medication Sig   acetaminophen (TYLENOL) 500 MG tablet Take 500 mg by mouth every 4 (four) hours as needed for mild pain or moderate pain.   albuterol (PROVENTIL) (2.5 MG/3ML) 0.083% nebulizer solution INHALE 3 MLS BY NEBULIZATION EVERY 6 (SIX) HOURS AS NEEDED FOR WHEEZING   albuterol (VENTOLIN HFA) 108 (90 Base) MCG/ACT inhaler INHALE 2 PUFFS BY MOUTH EVERY 6 HOURS AS NEEDED FOR WHEEZE   amLODipine (NORVASC) 5 MG tablet Take 2.5 mg by mouth daily.   atenolol (TENORMIN) 50 MG tablet Take 50 mg by mouth 2 (two) times daily.    atorvastatin (LIPITOR) 10 MG tablet Take 1 tablet by mouth daily.    benzonatate (TESSALON) 100 MG capsule TAKE 1 CAPSULE BY MOUTH THREE TIMES A DAY AS NEEDED FOR COUGH   benzonatate (TESSALON) 100 MG capsule Take 1 capsule (100 mg total) by mouth 3 (three) times daily as needed for cough.   cetirizine (ZYRTEC) 10 MG tablet Take 10 mg by mouth as needed for allergies.   denosumab (PROLIA) 60 MG/ML SOSY injection Inject 60 mg into the skin every 6 (six) months.   HYDROcodone-acetaminophen (NORCO/VICODIN) 5-325 MG tablet Take 1 tablet by mouth every 8 (eight) hours as needed for  severe pain (pain score 7-10). Must last 30 days.   [START ON 03/31/2023] HYDROcodone-acetaminophen (NORCO/VICODIN) 5-325 MG tablet Take 1 tablet by mouth every 8 (eight) hours as needed for severe pain (pain score 7-10). Must last 30 days.   [START ON 04/30/2023] HYDROcodone-acetaminophen (NORCO/VICODIN) 5-325 MG tablet Take 1 tablet by mouth every 8 (eight) hours as needed for severe pain (pain score 7-10). Must last 30 days.   ipratropium-albuterol (DUONEB) 0.5-2.5 (3) MG/3ML SOLN Take 3 mLs by nebulization every 6 (six) hours as needed.   losartan (COZAAR) 50 MG tablet Take 1 tablet by mouth daily.   mirtazapine (REMERON) 7.5 MG tablet Take 7.5 mg by mouth at bedtime.   ondansetron (ZOFRAN-ODT) 4 MG disintegrating tablet Take 1 tablet (4 mg total) by mouth every 8 (eight) hours as needed.   polyethylene glycol powder (GLYCOLAX/MIRALAX) 17 GM/SCOOP powder Take by mouth.   predniSONE (DELTASONE) 5 MG tablet TAKE 1 TABLET (5 MG TOTAL) BY MOUTH DAILY.   Propylene Glycol 0.6 % SOLN Place 1 drop into both eyes daily as needed (dryness).   sertraline (ZOLOFT) 25 MG tablet Take 25 mg by mouth daily.   TRELEGY ELLIPTA 200-62.5-25 MCG/ACT AEPB TAKE 1  PUFF BY MOUTH EVERY DAY    Immunization History  Administered Date(s) Administered   Fluad Quad(high Dose 65+) 12/22/2020, 02/04/2023   Influenza Inj Mdck Quad Pf 01/04/2022   Influenza Split 12/17/2016   Influenza, High Dose Seasonal PF 01/03/2017, 12/16/2017, 12/22/2018   Influenza-Unspecified 01/28/2011, 01/10/2012, 03/12/2013, 02/09/2014, 02/04/2015, 01/25/2016, 12/22/2018, 12/20/2019   PFIZER Comirnaty(Gray Top)Covid-19 Tri-Sucrose Vaccine 05/15/2019, 06/12/2019   PFIZER(Purple Top)SARS-COV-2 Vaccination 05/15/2019, 06/12/2019   Pneumococcal Conjugate-13 02/17/2016   Pneumococcal Polysaccharide-23 05/03/2011   Zoster Recombinant(Shingrix) 10/14/2017, 12/16/2017        Objective:     BP 118/68 (BP Location: Right Arm, Cuff Size: Normal)    Pulse 69   Temp (!) 97.3 F (36.3 C)   Ht 5\' 6"  (1.676 m)   Wt 109 lb 3.2 oz (49.5 kg)   SpO2 99%   BMI 17.63 kg/m   SpO2: 99 % O2 Device: Nasal cannula O2 Flow Rate (L/min): 2 L/min O2 Type: Pulse O2  GENERAL: Well-groomed woman, presents ambulating with help of a cane.  On supplemental oxygen at 2 L/min. HEAD: Normocephalic, atraumatic.  EYES: Pupils equal, round, reactive to light.  No scleral icterus.  MOUTH: Poor dentition, oral mucosa moist. NECK: Supple. No thyromegaly. Trachea midline. No JVD.  No adenopathy. PULMONARY: Good air entry bilaterally.  No adventitious sounds. CARDIOVASCULAR: S1 and S2. Regular rate and rhythm.  Loud mitral regurgitation murmur. ABDOMEN: Benign. MUSCULOSKELETAL: Significant dextro scoliosis, no clubbing, no edema.  NEUROLOGIC: No overt focal deficit. SKIN: Intact,warm,dry. PSYCH: Flat affect, normal behavior   Assessment & Plan:     ICD-10-CM   1. Pulmonary sarcoidosis (HCC)  D86.0 Pulmonary Function Test [facility] Only    2. Chronic respiratory failure with hypoxia (HCC)  J96.11     3. 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 Pulmonary hypertension (HCC)  I27.20 Pulmonary Function Test [facility] Only    5. Nonrheumatic mitral valve regurgitation  I34.0       Orders Placed This Encounter  Procedures   Pulmonary Function Test [facility] Only    Standing Status:   Future    Standing Expiration Date:   03/06/2024    Order Specific Question:   Full PFT: includes the following: basic spirometry, spirometry pre & post bronchodilator, diffusion capacity (DLCO), lung volumes    Answer:   Full PFT    Order Specific Question:   This test can only be performed at    Answer:   [facility]   Discussion:    Chronic Obstructive Pulmonary Disease (COPD) Breathing well-managed with albuterol and Trelegy. No new symptoms or exacerbations. Emphasized the importance of continuing current medications to manage symptoms and prevent exacerbations. Repeat  pulmonary function tests to be scheduled at the next visit. - Continue albuterol as needed for wheezing - Continue Trelegy as prescribed - Schedule repeat pulmonary function tests at next visit in 4-6 months  General Health Maintenance Received flu and RSV vaccinations. Current guidelines suggest RSV vaccination lasts several years; will monitor for updates. - Ensure annual flu vaccination - Monitor for future RSV vaccination guidelines  Follow-up - Schedule follow-up appointment in 4-6 months.      Gailen Shelter, MD Advanced Bronchoscopy PCCM [facility]    *This note was generated using voice recognition software/Dragon and/or AI transcription program.  Despite best efforts to proofread, errors can occur which can change the meaning. Any transcriptional errors that result from this process are unintentional and may not be fully corrected at the time of  dictation.

## 2023-03-07 NOTE — Patient Instructions (Signed)
VISIT SUMMARY:  During today's visit, we reviewed your respiratory condition and overall health. Your breathing is generally stable with occasional wheezing, which you manage effectively with albuterol. You are also using Trelegy and supplemental oxygen without any issues. We discussed the importance of continuing your current medications and scheduled a repeat pulmonary function test for your next visit. Additionally, we reviewed your vaccination history and general health maintenance.  YOUR PLAN:  -SARCOIDOSIS with COPD features   is a chronic lung condition that makes it hard to breathe. Your symptoms are well-managed with albuterol and Trelegy. Please continue using albuterol as needed for wheezing and take Trelegy as prescribed. We will schedule repeat pulmonary function tests at your next visit in 4-6 months.  -CHRONIC RESPIRATORY FAILURE: continue oxygen use as you are doing.   -GENERAL HEALTH MAINTENANCE: You have received both the flu and RSV vaccines in the past. The RSV vaccine lasts several years, and we will monitor for any updates. Please ensure you get your annual flu vaccination.  INSTRUCTIONS:  Please schedule a follow-up appointment in 4-6 months for repeat pulmonary function tests and to review your condition.

## 2023-03-09 ENCOUNTER — Ambulatory Visit: Payer: Medicare PPO

## 2023-03-19 ENCOUNTER — Other Ambulatory Visit: Payer: Self-pay | Admitting: Pulmonary Disease

## 2023-03-21 ENCOUNTER — Ambulatory Visit
Payer: Medicare PPO | Attending: Student in an Organized Health Care Education/Training Program | Admitting: Student in an Organized Health Care Education/Training Program

## 2023-03-21 ENCOUNTER — Ambulatory Visit
Admission: RE | Admit: 2023-03-21 | Discharge: 2023-03-21 | Disposition: A | Discharge status: Home / Self Care | Payer: Medicare PPO | Source: Ambulatory Visit | Attending: Student in an Organized Health Care Education/Training Program | Admitting: Student in an Organized Health Care Education/Training Program

## 2023-03-21 ENCOUNTER — Encounter: Payer: Self-pay | Admitting: Student in an Organized Health Care Education/Training Program

## 2023-03-21 DIAGNOSIS — M47816 Spondylosis without myelopathy or radiculopathy, lumbar region: Secondary | ICD-10-CM | POA: Diagnosis not present

## 2023-03-21 DIAGNOSIS — G894 Chronic pain syndrome: Secondary | ICD-10-CM | POA: Diagnosis not present

## 2023-03-21 MED ORDER — DEXAMETHASONE SODIUM PHOSPHATE 10 MG/ML IJ SOLN
10.0000 mg | Freq: Once | INTRAMUSCULAR | Status: AC
  Administered 2023-03-21: 10 mg

## 2023-03-21 MED ORDER — LIDOCAINE HCL 2 % IJ SOLN
20.0000 mL | Freq: Once | INTRAMUSCULAR | Status: AC
  Administered 2023-03-21: 400 mg

## 2023-03-21 MED ORDER — DEXAMETHASONE SODIUM PHOSPHATE 10 MG/ML IJ SOLN
INTRAMUSCULAR | Status: AC
Start: 2023-03-21 — End: ?
  Filled 2023-03-21: qty 2

## 2023-03-21 MED ORDER — LIDOCAINE HCL 2 % IJ SOLN
INTRAMUSCULAR | Status: AC
  Filled 2023-03-21: qty 20

## 2023-03-21 MED ORDER — ROPIVACAINE HCL 2 MG/ML IJ SOLN
INTRAMUSCULAR | Status: AC
  Filled 2023-03-21: qty 20

## 2023-03-21 MED ORDER — ROPIVACAINE HCL 2 MG/ML IJ SOLN
18.0000 mL | Freq: Once | INTRAMUSCULAR | Status: AC
  Administered 2023-03-21: 18 mL via PERINEURAL

## 2023-03-21 NOTE — Progress Notes (Signed)
Safety precautions to be maintained throughout the outpatient stay will include: orient to surroundings, keep bed in low position, maintain call bell within reach at all times, provide assistance with transfer out of bed and ambulation.  

## 2023-03-21 NOTE — Patient Instructions (Addendum)
Safety precautions to be maintained throughout the outpatient stay will include: orient to surroundings, keep bed in low position, maintain call bell within reach at all times, provide assistance with transfer out of bed and ambulation. Facet Joint Block The facet joints connect the bones of the spine (vertebrae). They let you bend, twist, and make other movements with your spine. They also keep you from bending too far, twisting too far, and making other extreme movements. A facet joint block is a procedure where a numbing medicine (local anesthesia) is injected into a facet joint. Many times, a medicine for inflammation (steroid) is also injected. A facet joint block may be done: To diagnose neck or back pain. If the pain gets better after a facet joint block, the pain is likely coming from the facet joint. If the pain does not get better, the pain is likely not coming from the facet joint. To treat neck or back pain caused by an inflamed facet joint. To help you with physical therapy or other rehab (rehabilitation) exercises. Tell a health care provider about: Any allergies you have. All medicines you are taking, including vitamins, herbs, eye drops, creams, and over-the-counter medicines. Any problems you or family members have had with anesthesia. Any bleeding problems you have. Any surgeries you have had. Any medical conditions you have or have had. Whether you are pregnant or may be pregnant. What are the risks? Your health care provider will talk with you about risks. These may include: Infection. Allergic reactions to medicines or dyes. Bleeding. Injury to a nerve near where the needle was put in (injection site). Pain at the injection site. Short-term weakness or numbness in areas near the nerves at the injection site. What happens before the procedure? When to stop eating and drinking Follow instructions from your health care provider about what you may eat and  drink. Medicines Ask your health care provider about: Changing or stopping your regular medicines. These include any diabetes medicines or blood thinners you take. Taking medicines such as aspirin and ibuprofen. These medicines can thin your blood. Do not take these medicines unless your health care provider tells you to. Taking over-the-counter medicines, vitamins, herbs, and supplements. General instructions If you will be going home right after the procedure, plan to have a responsible adult: Take you home from the hospital or clinic. You will not be allowed to drive. Care for you for the time you are told. Ask your health care provider: How your injection site will be marked. What steps will be taken to help prevent infection. These may include washing skin with a soap that kills germs. What happens during the procedure?  An IV will be inserted into one of your veins. You will lie on your stomach on an X-ray table. You may be asked to lie in a different position if you will be getting an injection in your neck. Your injection site will be cleaned with a soap that kills germs and then covered with a germ-free (sterile) drape. A local anesthesia will be put in at the injection site. A type of X-ray machine (fluoroscopy) or CT scan will be used to help find your facet joint. A contrast dye may also be injected into your joint to help show if the needle is at the joint. When your provider knows the needle is at your joint, they will inject anesthesia and anti-inflammatory medicine as needed. The needle will be removed. Pressure will be applied to keep your injection site from bleeding.  A bandage (dressing) will be placed over each injection site. The procedure may vary among health care providers and hospitals. What happens after the procedure? Your blood pressure, heart rate, breathing rate, and blood oxygen level will be monitored until you leave the hospital or clinic. This information  is not intended to replace advice given to you by your health care provider. Make sure you discuss any questions you have with your health care provider. Document Revised: 10/09/2021 Document Reviewed: 10/09/2021 Elsevier Patient Education  2024 [employer].

## 2023-03-21 NOTE — Progress Notes (Signed)
PROVIDER NOTE: Interpretation of information contained herein should be left to medically-trained personnel. Specific patient instructions are provided elsewhere under "Patient Instructions" section of medical record. This document was created in part using STT-dictation technology, any transcriptional errors that may result from this process are unintentional.  Patient: Autumn [name] Type: Established DOB: 09-12-1945 MRN: [id number] PCP: Enid Baas, MD  Service: Procedure DOS: 03/21/2023 Setting: Ambulatory Location: Ambulatory outpatient facility Delivery: Face-to-face Provider: Edward Jolly, MD Specialty: Interventional Pain Management Specialty designation: 09 Location: Outpatient facility Ref. Prov.: Edward Jolly, MD   Interventional Therapy     Procedure:           Type: Lumbar Facet, Medial Branch Block(s) #2  Laterality: Bilateral  Level: L3, L4, and L5 Medial Branch Level(s). Injecting these levels blocks the L3-4 and L4-5 lumbar facet joints. Imaging: Fluoroscopic guidance         Anesthesia: Local anesthesia (1-2% Lidocaine) DOS: 03/21/2023 Performed by: Edward Jolly, MD  Primary Purpose: Diagnostic/Therapeutic Indications: Low back pain severe enough to impact quality of life or function. 1. Chronic pain syndrome   2. 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 is a 77 y.o. (year old), female patient, seen  today for interventional treatment. She  has a past surgical history that includes Abdominal hysterectomy; Dilation and curettage of uterus; Vulvar lesion removal; Diagnostic laparoscopy; Eye surgery; Colonoscopy with propofol (N/A, 07/30/2019); and Breast excisional biopsy (Right). [name] has a current medication list which includes the following prescription(s): acetaminophen, albuterol, albuterol, amlodipine, atenolol, atorvastatin, benzonatate, benzonatate, cetirizine, denosumab, hydrocodone-acetaminophen, [START ON 03/31/2023] hydrocodone-acetaminophen, [START ON 04/30/2023] hydrocodone-acetaminophen, ipratropium-albuterol, losartan, mirtazapine, ondansetron, polyethylene glycol powder, prednisone, propylene glycol, sertraline, and trelegy ellipta. Her primarily concern today is the Back Pain  Initial Vital Signs:  Pulse/HCG Rate: 73  Temp: 98.1 F (36.7 C) Resp: 18 BP: (!) 182/77 SpO2: 99 % (2 L of O2)  BMI: Estimated body mass index is 17.23 kg/m as calculated from the following:   Height as of this encounter: 5\' 7"  (1.702 m).   Weight as of this encounter: 110 lb (49.9 kg).  Risk Assessment: Allergies: Reviewed. She is allergic to ivp dye [iodinated contrast media].  Allergy Precautions: None required Coagulopathies: Reviewed. None identified.  Blood-thinner therapy: None at this time Active Infection(s): Reviewed. None identified. [name] is afebrile  Site Confirmation: Ms. Demario was asked to confirm the procedure and laterality before marking the site Procedure checklist: Completed Consent: Before the procedure and under the influence of no sedative(s), amnesic(s), or anxiolytics, the patient was informed of the treatment options, risks and possible complications. To fulfill our ethical and legal obligations, as recommended by the American Medical Association's Code of Ethics, I have informed the patient of my clinical impression; the nature and purpose of the treatment or  procedure; the risks, benefits, and possible complications of the intervention; the alternatives, including doing nothing; the risk(s) and benefit(s)  of the alternative treatment(s) or procedure(s); and the risk(s) and benefit(s) of doing nothing. The patient was provided information about the general risks and possible complications associated with the procedure. These may include, but are not limited to: failure to achieve desired goals, infection, bleeding, organ or nerve damage, allergic reactions, paralysis, and death. In addition, the patient was informed of those risks and complications associated to Spine-related procedures, such as failure to decrease pain; infection (i.e.: Meningitis, epidural or intraspinal abscess); bleeding (i.e.: epidural hematoma, subarachnoid hemorrhage, or any other type of intraspinal or peri-dural bleeding); organ or nerve damage (i.e.: Any type of peripheral nerve, nerve root, or spinal cord injury) with subsequent damage to sensory, motor, and/or autonomic systems, resulting in permanent pain, numbness, and/or weakness of one or several areas of the body; allergic reactions; (i.e.: anaphylactic reaction); and/or death. Furthermore, the patient was informed of those risks and complications associated with the medications. These include, but are not limited to: allergic reactions (i.e.: anaphylactic or anaphylactoid reaction(s)); adrenal axis suppression; blood sugar elevation that in diabetics may result in ketoacidosis or comma; water retention that in patients with history of congestive heart failure may result in shortness of breath, pulmonary edema, and decompensation with resultant heart failure; weight gain; swelling or edema; medication-induced neural toxicity; particulate matter embolism and blood vessel occlusion with resultant organ, and/or nervous system infarction; and/or aseptic necrosis of one or more joints. Finally, the patient was informed that Medicine is  not an exact science; therefore, there is also the possibility of unforeseen or unpredictable risks and/or possible complications that may result in a catastrophic outcome. The patient indicated having understood very clearly. We have given the patient no guarantees and we have made no promises. Enough time was given to the patient to ask questions, all of which were answered to the patient's satisfaction. Ms. Bevington has indicated that she wanted to continue with the procedure. Attestation: I, the ordering provider, attest that I have discussed with the patient the benefits, risks, side-effects, alternatives, likelihood of achieving goals, and potential problems during recovery for the procedure that I have provided informed consent. Date  Time: 03/21/2023 11:00 AM   Pre-Procedure Preparation:  Monitoring: As per clinic protocol. Respiration, ETCO2, SpO2, BP, heart rate and rhythm monitor placed and checked for adequate function Safety Precautions: Patient was assessed for positional comfort and pressure points before starting the procedure. Time-out: I initiated and conducted the "Time-out" before starting the procedure, as per protocol. The patient was asked to participate by confirming the accuracy of the "Time Out" information. Verification of the correct person, site, and procedure were performed and confirmed by me, the nursing staff, and the patient. "Time-out" conducted as per Joint Commission's Universal Protocol (UP.01.01.01). Time: 1133  Description of Procedure:          Laterality: (see above) Targeted Levels: (see above)  Safety Precautions: Aspiration looking for blood return was conducted prior to all injections. At no point did we inject any substances, as a needle was being advanced. Before injecting, the patient was told to immediately notify me if she was experiencing any new onset of "ringing in the ears, or metallic taste in the mouth". No attempts were made at seeking any  paresthesias. Safe injection practices and needle disposal techniques used. Medications properly checked for expiration dates. SDV (single dose vial) medications used. After the completion of the procedure, all disposable equipment used was discarded in the proper designated medical waste containers. Local Anesthesia: Protocol guidelines were followed. The  patient was positioned over the fluoroscopy table. The area was prepped in the usual manner. The time-out was completed. The target area was identified using fluoroscopy. A 12-in long, straight, sterile hemostat was used with fluoroscopic guidance to locate the targets for each level blocked. Once located, the skin was marked with an approved surgical skin marker. Once all sites were marked, the skin (epidermis, dermis, and hypodermis), as well as deeper tissues (fat, connective tissue and muscle) were infiltrated with a small amount of a short-acting local anesthetic, loaded on a 10cc syringe with a 25G, 1.5-in  Needle. An appropriate amount of time was allowed for local anesthetics to take effect before proceeding to the next step. Local Anesthetic: Lidocaine 2.0% The unused portion of the local anesthetic was discarded in the proper designated containers. Technical description of process:  L3 Medial Branch Nerve Block (MBB): The target area for the L3 medial branch is at the junction of the postero-lateral aspect of the superior articular process and the superior, posterior, and medial edge of the transverse process of L4. Under fluoroscopic guidance, a Quincke needle was inserted until contact was made with os over the superior postero-lateral aspect of the pedicular shadow (target area). After negative aspiration for blood, 2mL of the nerve block solution was injected without difficulty or complication. The needle was removed intact. L4 Medial Branch Nerve Block (MBB): The target area for the L4 medial branch is at the junction of the postero-lateral  aspect of the superior articular process and the superior, posterior, and medial edge of the transverse process of L5. Under fluoroscopic guidance, a Quincke needle was inserted until contact was made with os over the superior postero-lateral aspect of the pedicular shadow (target area). After negative aspiration for blood, 2mL of the nerve block solution was injected without difficulty or complication. The needle was removed intact. L5 Medial Branch Nerve Block (MBB): The target area for the L5 medial branch is at the junction of the postero-lateral aspect of the superior articular process and the superior, posterior, and medial edge of the sacral ala. Under fluoroscopic guidance, a Quincke needle was inserted until contact was made with os over the superior postero-lateral aspect of the pedicular shadow (target area). After negative aspiration for blood, 2mL of the nerve block solution was injected without difficulty or complication. The needle was removed intact.   Once the entire procedure was completed, the treated area was cleaned, making sure to leave some of the prepping solution back to take advantage of its long term bactericidal properties.         Illustration of the posterior view of the lumbar spine and the posterior neural structures. Laminae of L2 through S1 are labeled. DPRL5, dorsal primary ramus of L5; DPRS1, dorsal primary ramus of S1; DPR3, dorsal primary ramus of L3; FJ, facet (zygapophyseal) joint L3-L4; I, inferior articular process of L4; LB1, lateral branch of dorsal primary ramus of L1; IAB, inferior articular branches from L3 medial branch (supplies L4-L5 facet joint); IBP, intermediate branch plexus; MB3, medial branch of dorsal primary ramus of L3; NR3, third lumbar nerve root; S, superior articular process of L5; SAB, superior articular branches from L4 (supplies L4-5 facet joint also); TP3, transverse process of L3.  Vitals:   03/21/23 1125 03/21/23 1130 03/21/23 1135  03/21/23 1140  BP: (!) 199/86 (!) 201/84 (!) 210/90   Pulse: 78 75 76 76  Resp: 16 20 16 16   Temp:      TempSrc:  SpO2: 100% 100% 100% 100%  Weight:      Height:         Start Time: 1133 hrs. End Time: 1138 hrs.  Imaging Guidance (Spinal):          Type of Imaging Technique: Fluoroscopy Guidance (Spinal) Indication(s): Assistance in needle guidance and placement for procedures requiring needle placement in or near specific anatomical locations not easily accessible without such assistance. Exposure Time: Please see nurses notes. Contrast: None used. Fluoroscopic Guidance: I was personally present during the use of fluoroscopy. "Tunnel Vision Technique" used to obtain the best possible view of the target area. Parallax error corrected before commencing the procedure. "Direction-depth-direction" technique used to introduce the needle under continuous pulsed fluoroscopy. Once target was reached, antero-posterior, oblique, and lateral fluoroscopic projection used confirm needle placement in all planes. Images permanently stored in EMR. Interpretation: No contrast injected. I personally interpreted the imaging intraoperatively. Adequate needle placement confirmed in multiple planes. Permanent images saved into the patient's record.  Post-operative Assessment:  Post-procedure Vital Signs:  Pulse/HCG Rate: 76  Temp: 98.1 F (36.7 C) Resp: 16 BP: (!) 210/90 SpO2: 100 %  EBL: None  Complications: No immediate post-treatment complications observed by team, or reported by patient.  Note: The patient tolerated the entire procedure well. A repeat set of vitals were taken after the procedure and the patient was kept under observation following institutional policy, for this type of procedure. Post-procedural neurological assessment was performed, showing return to baseline, prior to discharge. The patient was provided with post-procedure discharge instructions, including a section on how to  identify potential problems. Should any problems arise concerning this procedure, the patient was given instructions to immediately contact us, at any time, without hesitation. In any case, we plan to contact the patient by telephone for a follow-up status report regarding this interventional procedure.  Comments:  No additional relevant information.  Plan of Care  Orders:  Orders Placed This Encounter  Procedures   DG PAIN CLIN[facility] C-ARM 1-60 MIN NO REPORT    Intraoperative interpretation by procedural physician at [facility].    Standing Status:   Standing    Number of Occurrences:   1    Order Specific Question:   Reason for exam:    Answer:   Assistance in needle guidance and placement for procedures requiring needle placement in or near specific anatomical locations not easily accessible without such assistance.      Medications ordered for procedure: Meds ordered this encounter  Medications   lidocaine (XYLOCAINE) 2 % (with pres) injection 400 mg   dexamethasone (DECADRON) injection 10 mg   dexamethasone (DECADRON) injection 10 mg   ropivacaine (PF) 2 mg/mL (0.2%) (NAROPIN) injection 18 mL   Medications administered: We administered lidocaine, dexamethasone, dexamethasone, and ropivacaine (PF) 2 mg/mL (0.2%).  See the medical record for exact dosing, route, and time of administration.  Follow-up plan:   Return in about 6 weeks (around 05/02/2023) for F2F PPE.    Recent Visits Date Type Provider Dept  03/01/23 Office Visit Edward Jolly, MD [facility]  12/30/22 Office Visit Edward Jolly, MD [facility]  Showing recent visits within past 90 days and meeting all other requirements Today's Visits Date Type Provider Dept  03/21/23 Procedure visit Edward Jolly, MD [facility]  Showing today's visits and meeting all other requirements Future Appointments No visits were found meeting these conditions. Showing future appointments within  next 90 days and meeting all other requirements  Disposition: Discharge home  Discharge (Date  Time): 03/21/2023;   hrs.   Primary Care Physician: Enid Baas, MD Location: [facility] Note by: Edward Jolly, MD Date: 03/21/2023; Time: 11:41 AM  Disclaimer:  Medicine is not an exact science. The only guarantee in medicine is that nothing is guaranteed. It is important to note that the decision to proceed with this intervention was based on the information collected from the patient. The Data and conclusions were drawn from the patient's questionnaire, the interview, and the physical examination. Because the information was provided in large part by the patient, it cannot be guaranteed that it has not been purposely or unconsciously manipulated. Every effort has been made to obtain as much relevant data as possible for this evaluation. It is important to note that the conclusions that lead to this procedure are derived in large part from the available data. Always take into account that the treatment will also be dependent on availability of resources and existing treatment guidelines, considered by other Pain Management Practitioners as being common knowledge and practice, at the time of the intervention. For Medico-Legal purposes, it is also important to point out that variation in procedural techniques and pharmacological choices are the acceptable norm. The indications, contraindications, technique, and results of the above procedure should only be interpreted and judged by a Board-Certified Interventional Pain Specialist with extensive familiarity and expertise in the same exact procedure and technique.

## 2023-03-21 NOTE — Telephone Encounter (Signed)
Dr. Jayme Cloud, please advise if okay to order? Thanks

## 2023-03-22 ENCOUNTER — Telehealth: Payer: Self-pay | Admitting: *Deleted

## 2023-03-22 NOTE — Telephone Encounter (Signed)
Attempted to call for post procedure follow-up. Message left. 

## 2023-04-20 ENCOUNTER — Inpatient Hospital Stay: Payer: Medicare PPO | Attending: Obstetrics and Gynecology | Admitting: Obstetrics and Gynecology

## 2023-04-20 VITALS — BP 177/77 | HR 70 | Temp 97.6°F | Resp 20 | Wt 111.3 lb

## 2023-04-20 DIAGNOSIS — Z8543 Personal history of malignant neoplasm of ovary: Secondary | ICD-10-CM | POA: Diagnosis not present

## 2023-04-20 DIAGNOSIS — N893 Dysplasia of vagina, unspecified: Secondary | ICD-10-CM | POA: Diagnosis not present

## 2023-04-20 DIAGNOSIS — D869 Sarcoidosis, unspecified: Secondary | ICD-10-CM | POA: Insufficient documentation

## 2023-04-20 DIAGNOSIS — J9610 Chronic respiratory failure, unspecified whether with hypoxia or hypercapnia: Secondary | ICD-10-CM | POA: Diagnosis not present

## 2023-04-20 DIAGNOSIS — Z9221 Personal history of antineoplastic chemotherapy: Secondary | ICD-10-CM | POA: Insufficient documentation

## 2023-04-20 DIAGNOSIS — D071 Carcinoma in situ of vulva: Secondary | ICD-10-CM | POA: Insufficient documentation

## 2023-04-20 DIAGNOSIS — Z9079 Acquired absence of other genital organ(s): Secondary | ICD-10-CM | POA: Diagnosis not present

## 2023-04-20 DIAGNOSIS — Z90710 Acquired absence of both cervix and uterus: Secondary | ICD-10-CM | POA: Insufficient documentation

## 2023-04-20 DIAGNOSIS — Z90722 Acquired absence of ovaries, bilateral: Secondary | ICD-10-CM | POA: Diagnosis not present

## 2023-04-20 NOTE — Progress Notes (Signed)
 Gynecologic Oncology Consult Visit   Referring Provider: Dr Lemmie Oar  Chief Complaint: VIN & Ovarian Cancer (surveillance)  Subjective:  Autumn [name] is a 78 y.o. female who is seen in consultation from Dr. 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   She presents for follow-up Pap smear and pelvic exam. She has no gynecologic complaints. Her non-gyn symptoms are stable. She states her provider told her the perianal disease was unchanged.   03/10/2022 Pap NILM; HRHPV positive   Oncologic & Gynecologic History:  Referred from PCP for pelvic mass in 11/2009 with vaginal discomfort and urinary urgency. She was s/p TAH in early 1990s for benign causes. CT A/P on 12/17/09 showed solid appearing pelvic mass. On 01/07/10 she underwent ex-lap, BSO, infragastric omentectomy, left pelvic and periaortic LN dissection, and peritoneal biopsies.   Final pathology revealed IA, grade 2, endometrioid adenocarcinoma with focal squamous differentiation involving the left ovary. Tumor size 15.6 cm, Tumor removed intact without surface involvement. Negative right ovary, peritoneal bipsies, sidewall biopsies, gutter biopsies, culdesac biopsy, omentum, 20 left pelvic LNs, 2 left periaortic LNs, 3 left common LNs, and washings.   CA 125 at time of surgery was 677.   She received adjuvant chemotherapy with carboplatin and paclitaxel x 3 cycles, from 02/09/10-03/23/10. CA 125 at completion of chemotherapy was 15.   August 2013- CA 125 increased to 96.8 at time she experienced flare of her sarcoidosis. Follow up CA 125 decreased to 26.1 in 12/2011.   12/28/10- dyspareunia and vulvar biopsy revealed VIN 3. She went to OR on 01/23/11 for WLE which returned VIN 3 to cauterized margin. Repeat biopsy 11/15/11 revealed VIN 3.   Pap 05/15/12 returned LSIL. She was evaluated by Dr Lauree as a second opinion for her vulvar dysplasia and  abnormal pap on 05/30/12.   Vaginal biopsy on 05/30/12 returned VaIN-3, lesion was biopsied from the left vaginal fornix at 3:00. At that time she declined to proceed with further resection of her vulva as she desired to postpone surgery until her sarcoid was improved.   On 07/05/12 pt underwent colposcopy, partial skinning vulvectomy/wide local excision and laser ablation of vaginal lesions. The final pathology revealed VIN 3 extending to surgical resection margin.    Pap on 11/28/13 showed LSIL.    12/23/14: pap - LSIL/HPV positive, vulvar biopsy on 12/23/14 with VIN 3. Mild dysplasia on the right and left introitus. Initiated Aldara 01/2015.    07/14/15: Vulva biopsy with VIN 3.    09/26/2015 Patent underwent laser ablation with Dr. Caye    05/31/16: CA 125 - 15.3, colposcopy with no acetowhite changes.   11/29/16: LSIL/HPV(+) vaginal pap, vaginal and vulvar colposcopy unremarkable.   06/13/17: Colposcopy of the vagina, with biopsy of the left vulva consistent with vascular ectasia. CA 125 13.8    12/26/17: Routine surveillance. Colposcopy of the vagina was unremarkable, exam of vulva was normal. Exam of perianal region was notable for hyperpigmented and hypopigmented lesions. CA 125 stable at 14.1. perianal biopsy showed PAIN-3   09/20/18: Mapping anal biopsies with Gen Surg showed PAIN-2   11/06/2018: Vaginal pap LSIL + HR HPV/ + type 16 / negative 18   11/06/18: vulvar/vaginal colposcopy:  Acetowhitening seen on right vulva 7:00-9:00, left vulvar vaginal junction at 4:00, posterior perianal area 5:00-7:00. Diffuse minimal acetowhitening on vaginal mucosa   11/2018: Combined procedure with GSU cancelled due to uncontrolled HTN.   06/2019: clinic visit  with Dr Georgina - colorectal surgery for surveillance of AIN / PaIN. Instructed to start cidofovir.    09/14/19: clinic visit with Dr Georgina - colorectal surgery. Anoscopy was tolerated and showed no abnormalities. External exam : without  significant change to the previously noted L posterior 2x4 cm hypopigmented perianal area.   09/17/19: Clinic visit with Dr Caye - gyn oncology. She reported application of cidofovir to vulva and perianal area 5 days on and 5 days off. Exam findings: Vagina/External genitalia: collection of white raised 1 mm circular lesions at vaginal introitus from 5:00 to 7:00 consistent with previous exam. Rectal: Aceto white change with collected 1 mm round raised lesions 7:00 to 8:00, 2 mm dark lesion at 7:00 in peri anal area. Recommend to continue cidofovir to vulva/perianal areas   12/11/19: Clinic visit with gyn oncology. Colposcopy / biopsies deferred given Dr Caye not in clinic at this time. Vulvar exam reassuring. Small area of white plaque just next to urethra on left. Bilateral skin breakdown on buttocks adjacent to rectum.   12/14/19 Clinic visit with Dr. Georgina - colorectal surgery. Cidofivir held in setting of extreme skin reaction. Plan for follow up in 3 months for surveillance and consideration of restarting cidofivir perianally  03/14/20 Clinic visit with Dr Georgina, transitioned to Aldara  06/2020: discontinued Aldara.   08/11/20. On exam, she had colpo with very mild acetowhite changes along the posterior introitus from 5-7, areas are < 5 mm and not concerning for high grade process. Vagina was atrophic. Otherwise unremarkable.   03/02/21- Saw Dr. Caye at [facility]. Clinically, no evidence of high grade dysplasia or invasive cancer. Pap obtained- LSIL, ASUC- cannot rule out high grade lesion. HPVHR postiive - HPV16+ She had limited colposcopy d/t inability to lay flat in setting of lung disease. She elected for less aggressive follow up and to move care to [facility] after the passing of her husband.         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   Depression, prolonged 04/07/2018   Chronic respiratory failure (HCC)    Palliative care encounter    Protein-calorie malnutrition, severe 02/06/2018   Acute respiratory failure (HCC) 02/04/2018   COPD (chronic obstructive pulmonary disease) (HCC) 03/22/2017   Post herpetic neuralgia 07/30/2016   Hypercalcemia 02/17/2016   Chronic angle-closure glaucoma of both eyes, severe stage 01/20/2016   Age-related nuclear cataract of both eyes 12/17/2015   Pneumonia 11/17/2015   Long term current use of systemic steroids 02/14/2015   Pulmonary hypertension (HCC) 10/05/2013   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   Abnormal Pap smear of vagina 05/31/2012   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   Ovarian cancer (HCC) 12/11/2009   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    Past Medical History: Past Medical History:  Diagnosis Date   Allergy    Anemia    Arthritis    COPD (chronic obstructive pulmonary disease) (HCC)    Glaucoma    H/O uvulectomy    Hypertension    Osteoporosis    Ovarian cancer (HCC)    Postherpetic neuralgia  Pulmonary sarcoidosis (HCC)    Sarcoid     Past Surgical History: Past Surgical History:  Procedure Laterality Date   ABDOMINAL HYSTERECTOMY     BREAST EXCISIONAL BIOPSY Right    benign   COLONOSCOPY WITH PROPOFOL  N/A 07/30/2019   Procedure: COLONOSCOPY WITH PROPOFOL ;  Surgeon: Toledo, Ladell POUR, MD;  Location: [facility];  Service: Gastroenterology;  Laterality: N/A;   DIAGNOSTIC LAPAROSCOPY     DILATION AND CURETTAGE OF UTERUS     EYE SURGERY     VULVAR LESION REMOVAL      Past Gynecologic History:  As per interval history and history  of present illness   OB History: P2 NSVD x 2 OB History  No obstetric history on file.    Family History: Family History  Problem Relation Age of Onset   Breast cancer Mother 60   Cancer Mother    Hypertension Father     Social History: Social History   Socioeconomic History   Marital status: Married    Spouse name: Not on file   Number of children: Not on file   Years of education: Not on file   Highest education level: Not on file  Occupational History   Not on file  Tobacco Use   Smoking status: Former    Current packs/day: 0.00    Types: Cigarettes    Start date: 6    Quit date: 1988    Years since quitting: 37.0   Smokeless tobacco: Never   Tobacco comments:    Quit smoking between age [age]. 1 pack lasted >2 weeks.  Vaping Use   Vaping status: Never Used  Substance and Sexual Activity   Alcohol use: No   Drug use: No   Sexual activity: Not Currently    Birth control/protection: None  Other Topics Concern   Not on file  Social History Narrative   Not on file   Social Drivers of Health   Financial Resource Strain: Not on file  Food Insecurity: Not on file  Transportation Needs: Not on file  Physical Activity: Not on file  Stress: Not on file  Social Connections: Not on file  Intimate Partner Violence: Not on file    Allergies: Allergies  Allergen Reactions   Ivp Dye [Iodinated Contrast Media] Hives and Itching    Current Medications: Current Outpatient Medications  Medication Sig Dispense Refill   acetaminophen  (TYLENOL ) 500 MG tablet Take 500 mg by mouth every 4 (four) hours as needed for mild pain or moderate pain.     albuterol  (PROVENTIL ) (2.5 MG/3ML) 0.083% nebulizer solution INHALE 3 MLS BY NEBULIZATION EVERY 6 (SIX) HOURS AS NEEDED FOR WHEEZING 300 mL 2   albuterol  (VENTOLIN  HFA) 108 (90 Base) MCG/ACT inhaler INHALE 2 PUFFS BY MOUTH EVERY 6 HOURS AS NEEDED FOR WHEEZE 18 each 11   amLODipine  (NORVASC ) 5 MG tablet Take 2.5 mg by  mouth daily.     atenolol  (TENORMIN ) 50 MG tablet Take 50 mg by mouth 2 (two) times daily.      atorvastatin  (LIPITOR) 10 MG tablet Take 1 tablet by mouth daily.      cetirizine (ZYRTEC) 10 MG tablet Take 10 mg by mouth as needed for allergies.     denosumab (PROLIA) 60 MG/ML SOSY injection Inject 60 mg into the skin every 6 (six) months.     HYDROcodone -acetaminophen  (NORCO/VICODIN) 5-325 MG tablet Take 1 tablet by mouth every 8 (eight) hours as needed for severe pain (pain score 7-10). Must last 30 days.  90 tablet 0   [START ON 04/30/2023] HYDROcodone -acetaminophen  (NORCO/VICODIN) 5-325 MG tablet Take 1 tablet by mouth every 8 (eight) hours as needed for severe pain (pain score 7-10). Must last 30 days. 90 tablet 0   ipratropium-albuterol  (DUONEB) 0.5-2.5 (3) MG/3ML SOLN Take 3 mLs by nebulization every 6 (six) hours as needed. 360 mL 0   losartan  (COZAAR ) 50 MG tablet Take 1 tablet by mouth daily.     mirtazapine  (REMERON ) 7.5 MG tablet Take 7.5 mg by mouth at bedtime.     ondansetron  (ZOFRAN -ODT) 4 MG disintegrating tablet Take 1 tablet (4 mg total) by mouth every 8 (eight) hours as needed. 20 tablet 0   polyethylene glycol powder (GLYCOLAX /MIRALAX ) 17 GM/SCOOP powder Take by mouth.     predniSONE  (DELTASONE ) 5 MG tablet TAKE 1 TABLET (5 MG TOTAL) BY MOUTH DAILY. 30 tablet 3   Propylene Glycol 0.6 % SOLN Place 1 drop into both eyes daily as needed (dryness).     sertraline  (ZOLOFT ) 25 MG tablet Take 25 mg by mouth daily.  11   TRELEGY ELLIPTA  200-62.5-25 MCG/ACT AEPB TAKE 1 PUFF BY MOUTH EVERY DAY 60 each 11   benzonatate  (TESSALON ) 100 MG capsule TAKE 1 CAPSULE BY MOUTH THREE TIMES A DAY AS NEEDED FOR COUGH (Patient not taking: Reported on 04/20/2023) 30 capsule 1   benzonatate  (TESSALON ) 100 MG capsule Take 1 capsule (100 mg total) by mouth 3 (three) times daily as needed for cough. (Patient not taking: Reported on 04/20/2023) 30 capsule 0   No current facility-administered medications for this  visit.   Review of Systems:  General: fatigue and weakness  HEENT: no complaints  Lungs: shortness of breath  Cardiac: no complaints  GI: no complaints  GU: no complaints  Musculoskeletal: no complaints  Extremities: no complaints  Skin: no complaints  Neuro: no complaints  Endocrine: no complaints  Psych: no complaints       Objective:  Physical Examination:  BP (!) 177/77   Pulse 70   Temp 97.6 F (36.4 C)   Resp 20   Wt 111 lb 4.8 oz (50.5 kg)   SpO2 100%   BMI 17.43 kg/m     ECOG Performance Status: 3 - Symptomatic, >50% confined to bed  GENERAL: Frail appearing female in no acute distress; using oxygen and a walker to enter clinic HEENT:   Atraumatic normocephalic NODES:  No supraclavicular or inguinal lymphadenopathy palpated.  LUNGS:  using O2, no distress ABDOMEN:  Soft, nontender.  Nondistended and no evidence of ascites or masses.  No organomegaly palpated. EXTREMITIES:  No peripheral edema.   NEURO:  Nonfocal. Well oriented.  Appropriate affect.  Pelvic: Exam Chaperoned by  RN EGBUS: White epithelium along vaginal introitus 6-9 o'clock not appreciated this visit.  Vagina: no lesions, discharge, or bleeding. Pap collected.  Cervix, Uterus, Ovaries- surgically absent    Lab Review No labs on site today.   Radiologic Imaging: No imaging on site    Assessment:  Autumn [name] is a 78 y.o. female with history of stage 1-A, grade 2 endometrioid ovarian cancer s/p 3 cycles of carbo/taxol (completed 03/23/2010). CA 125 was 677 at time of surgery. Elected to no longer check ca 125 values after 10 years. Clinically NED.   Multifocal dysplasia of vulva, vagina, perianal/anus in setting of chronic immunosuppression. VAIN III and VIN III status post partial skinning vulvectomy/wide local excision and laser ablation of vaginal lesions 06/2012. Mild dysplasia on the right and left introitus - biopsy  12/23/14 with recurrent VIN 3. Initiated Aldara 01/2015. VIN-3 on  biopsy taken 07/14/15. Laser ablation 09/26/2015. She was initiated on cidofovir topically 06/2019; discontinued 12/14/19 due to skin breakdown. Pap 11/21- LSIL, + HR HPV, + type 16. Colpo negative. Resumed Aldara 03/2020, discontinued 06/2020. 02/2021 pap- LSIL, ASCUS, cannot exclude HSIL. NPV not performed. Limited colposcopy. She elected for less aggressive follow up and has transitioned care to [facility]. 03/10/2022 Pap NILM/HRHPV+  PAIN & AIN- diagnosed 12/26/17 on perianal biopsy- PAIN3. Follow up gen surg perianal biopsies on 09/30/18 showing pain2. followed by Dr. Georgina.   Chronic immunosuppression for sarcoidosis  Chronic Respiratory failure with baseline oxygen requirement  Postherpetic neuralgias- stable.   Medical co-morbidities complicating care: sarcoidosis, hypertension, osteoporosis, pulmonary hypertension, anal dysplasia, anal dysplasia.  Plan:   Problem List Items Addressed This Visit       Genitourinary   VIN III (vulvar intraepithelial neoplasia III)   Other Visit Diagnoses       VAIN (vaginal intraepithelial neoplasia)    -  Primary     History of ovarian cancer           We will follow-up on the Pap/HPV obtained today.    Recommended that she follow-up with her PCP regarding her non-gyn symptoms and with Dr. Georgina regarding her perianal dysplasia.   She declined genetic testing (recommended given h/o ovarian cancer).   Suggested return to clinic in  1 year.     Monic Engelmann ISIDOR CONSTABLE, MD

## 2023-04-26 LAB — IGP, APTIMA HPV: HPV Aptima: POSITIVE — AB

## 2023-05-02 ENCOUNTER — Encounter: Payer: Self-pay | Admitting: Student in an Organized Health Care Education/Training Program

## 2023-05-02 ENCOUNTER — Ambulatory Visit
Payer: Medicare PPO | Attending: Student in an Organized Health Care Education/Training Program | Admitting: Student in an Organized Health Care Education/Training Program

## 2023-05-02 DIAGNOSIS — G894 Chronic pain syndrome: Secondary | ICD-10-CM

## 2023-05-02 DIAGNOSIS — M47816 Spondylosis without myelopathy or radiculopathy, lumbar region: Secondary | ICD-10-CM

## 2023-05-02 NOTE — Progress Notes (Signed)
Patient: Autumn [name]  Service Category: E/M  Provider: Edward Jolly, MD  DOB: Apr 08, 1946  DOS: 05/02/2023  Location: Office  MRN: [id number]  Setting: Ambulatory outpatient  Referring Provider: Enid Baas, MD  Type: Established Patient  Specialty: Interventional Pain Management  PCP: Enid Baas, MD  Location: Remote location  Delivery: TeleHealth     Virtual Encounter - Pain Management PROVIDER NOTE: Information contained herein reflects review and annotations entered in association with encounter. Interpretation of such information and data should be left to medically-trained personnel. Information provided to patient can be located elsewhere in the medical record under "Patient Instructions". Document created using STT-dictation technology, any transcriptional errors that may result from process are unintentional.    Contact & Pharmacy Preferred: [phone] Home: [phone] (home) Mobile: [phone] (mobile) E-mail: [email or web address]  [id number] - [place], [place] - [address] [place] [address] Phone: [phone] Fax: [phone]  [id number] [place], [place] - [address] AT [facility] OF [address] [facility] [address] [place] [address] Phone: [phone] Fax: [phone]  [id number] - [address] [place] - [address] [place]. [place]. [place] [address] Phone: [phone] Fax: [phone]  [id number] - [place],  - [address] AT [facility] [address] [place] [address] [place] [address] Phone: [phone] Fax: [phone]  [id number], [place] - [address] AT [facility] [address] [facility] [place] [address] Phone: [phone] Fax: [phone]   Pre-screening  Autumn [name] offered "in-person" vs "virtual" encounter. She indicated preferring virtual for this encounter.    Reason COVID-19*  Social distancing based on CDC and AMA recommendations.   I contacted Autumn [name] on 05/02/2023 via telephone.      I clearly identified myself as Edward Jolly, MD. I verified that I was speaking with the correct person using two identifiers (Name: [name], and date of birth: 20-Feb-1946).  Consent I sought verbal advanced consent from Autumn [name] for virtual visit interactions. I informed Ms. Rohlik of possible security and privacy concerns, risks, and limitations associated with providing "not-in-person" medical evaluation and management services. I also informed Ms. Ugalde of the availability of "in-person" appointments. Finally, I informed her that there would be a charge for the virtual visit and that she could be  personally, fully or partially, financially responsible for it. Ms. Hortin expressed understanding and agreed to proceed.   Historic Elements   Autumn [name] is a 78 y.o. year old, female patient evaluated today after our last contact on 03/21/2023. Autumn [name]  has a past medical history of Allergy, Anemia, Arthritis, COPD (chronic obstructive pulmonary disease) (HCC), Glaucoma, H/O uvulectomy, Hypertension, Osteoporosis, Ovarian cancer (HCC), Postherpetic neuralgia, Pulmonary sarcoidosis (HCC), and Sarcoid. She also  has a past surgical history that includes Abdominal hysterectomy; Dilation and curettage of uterus; Vulvar lesion removal; Diagnostic laparoscopy; Eye surgery; Colonoscopy with propofol (N/A, 07/30/2019); and Breast excisional biopsy (Right). Ms. Karlberg has a current medication list which includes the following prescription(s): acetaminophen, albuterol, albuterol, amlodipine, atenolol, atorvastatin, cetirizine, denosumab, hydrocodone-acetaminophen, ipratropium-albuterol, losartan, mirtazapine, ondansetron, polyethylene glycol powder, prednisone, propylene glycol, sertraline, trelegy ellipta, benzonatate, and benzonatate. She  reports that she  quit smoking about 37 years ago. Her smoking use included cigarettes. She started smoking about 57 years ago. She has never used smokeless  tobacco. She reports that she does not drink alcohol and does not use drugs. Autumn [name] is allergic to ivp dye [iodinated contrast media].  BMI: Estimated body mass index is 17.43 kg/m as calculated from the following:   Height as of 03/21/23: 5\' 7"  (1.702 m).   Weight as of 04/20/23: 111 lb 4.8 oz (50.5 kg). Last encounter: 03/01/2023. Last procedure: 03/21/2023.  HPI  Today, she is being contacted for a post-procedure assessment.   Post-procedure evaluation    Type: Lumbar Facet, Medial Branch Block(s) #2  Laterality: Bilateral  Level: L3, L4, and L5 Medial Branch Level(s). Injecting these levels blocks the L3-4 and L4-5 lumbar facet joints. Imaging: Fluoroscopic guidance         Anesthesia: Local anesthesia (1-2% Lidocaine) DOS: 03/21/2023 Performed by: Edward Jolly, MD  Primary Purpose: Diagnostic/Therapeutic Indications: Low back pain severe enough to impact quality of life or function. 1. Chronic pain syndrome   2. Lumbar facet arthropathy   3. Spondylosis without myelopathy or radiculopathy, lumbar region    NAS-11 Pain score:   Pre-procedure: 7 /10   Post-procedure: 7 /10     Effectiveness:  Initial hour after procedure: 50% Subsequent 4-6 hours post-procedure: 50% Analgesia past initial 6 hours:50% for 2 days Ongoing improvement:  Analgesic:  50% for 2 days  Laboratory Chemistry Profile   Renal Lab Results  Component Value Date   BUN 29 (H) 06/26/2021   CREATININE 0.79 06/26/2021   GFRAA >60 02/13/2018   GFRNONAA >60 06/26/2021    Hepatic Lab Results  Component Value Date   AST 25 06/26/2021   ALT 23 06/26/2021   ALBUMIN 4.6 06/26/2021   ALKPHOS 42 06/26/2021   LIPASE 32 06/26/2021    Electrolytes Lab Results  Component Value Date   NA 138 06/26/2021   K 3.7 06/26/2021   CL 94 (L) 06/26/2021   CALCIUM 9.5  06/26/2021   MG 2.7 (H) 02/07/2018   PHOS 3.2 02/07/2018    Bone No results found for: "VD25OH", "VD125OH2TOT", "NW2956OZ3", "YQ6578IO9", "25OHVITD1", "25OHVITD2", "25OHVITD3", "TESTOFREE", "TESTOSTERONE"  Inflammation (CRP: Acute Phase) (ESR: Chronic Phase) Lab Results  Component Value Date   ESRSEDRATE 48 (H) 05/25/2011         Note: Above Lab results reviewed.  Imaging  DG PAIN CLINIC C-ARM 1-60 MIN NO REPORT Fluoro was used, but no Radiologist interpretation will be provided.  Please refer to "NOTES" tab for provider progress note.  Assessment  The primary encounter diagnosis was Lumbar facet arthropathy. Diagnoses of Spondylosis without myelopathy or radiculopathy, lumbar region and Chronic pain syndrome were also pertinent to this visit.  Plan of Care  We discussed next steps which include lumbar radiofrequency ablation since the patient has had 2 positive diagnostic lumbar facet medial branch nerve blocks.  I explained to her what the ablation procedure entails in great detail.  Risks and benefits were reviewed.  Patient states that she would like to think about this a bit further and will contact us if she would like to proceed with a lumbar RFA.  As needed order placed below for lumbar RFA.  Pharmacotherapy (Medications Ordered): No orders of the defined types were placed in this encounter.  Orders:  Orders Placed This Encounter  Procedures   Radiofrequency,Lumbar    Standing Status:   Standing    Number of Occurrences:   1    Next Expected Occurrence:   07/22/2023    Expiration Date:   05/01/2024    Scheduling Instructions:  B/L L3,4,5 RFA     Sedation up to patient     Ensure pt not on blood thiinners     PRN procedure- pt will call to schedule    Where will this procedure be performed?:   [facility]   Follow-up plan:   Return if symptoms worsen or fail to improve.     Recent Visits Date Type Provider Dept  03/21/23 Procedure visit Edward Jolly, MD [facility]  03/01/23 Office Visit Edward Jolly, MD [facility]  Showing recent visits within past 90 days and meeting all other requirements Today's Visits Date Type Provider Dept  05/02/23 Office Visit Edward Jolly, MD [facility]  Showing today's visits and meeting all other requirements Future Appointments No visits were found meeting these conditions. Showing future appointments within next 90 days and meeting all other requirements  I discussed the assessment and treatment plan with the patient. The patient was provided an opportunity to ask questions and all were answered. The patient agreed with the plan and demonstrated an understanding of the instructions.  Patient advised to call back or seek an in-person evaluation if the symptoms or condition worsens.  Duration of encounter: .  Note by: Edward Jolly, MD Date: 05/02/2023; Time: 1:55 PM

## 2023-05-03 ENCOUNTER — Telehealth: Payer: Self-pay

## 2023-05-03 NOTE — Patient Instructions (Signed)
Procedure instructions  Do not eat or drink fluids (other than water) for 6 hours before your procedure  No water for 2 hours before your procedure  Take your blood pressure medicine with a sip of water  Arrive 30 minutes before your appointment  Carefully read the "Preparing for your procedure" detailed instructions  If you have questions call us at [phone]  _____________________________________________________________________    ______________________________________________________________________  Preparing for your procedure  Appointments: If you think you may not be able to keep your appointment, call 24-48 hours in advance to cancel. We need time to make it available to others.  During your procedure appointment there will be: No Prescription Refills. No disability issues to discussed. No medication changes or discussions.  Instructions: Food intake: Avoid eating anything solid for at least 8 hours prior to your procedure. Clear liquid intake: You may take clear liquids such as water up to 2 hours prior to your procedure. (No carbonated drinks. No soda.) Transportation: Unless otherwise stated by your physician, bring a driver. Morning Medicines: Except for blood thinners, take all of your other morning medications with a sip of water. Make sure to take your heart and blood pressure medicines. If your blood pressure's lower number is above 100, the case will be rescheduled. Blood thinners: Make sure to stop your blood thinners as instructed.  If you take a blood thinner, but were not instructed to stop it, call our office [phone] and ask to talk to a nurse. Not stopping a blood thinner prior to certain procedures could lead to serious complications. Diabetics on insulin: Notify the staff so that you can be scheduled 1st case in the morning. If your diabetes requires high dose insulin, take only  of your normal insulin dose the morning of the procedure and  notify the staff that you have done so. Preventing infections: Shower with an antibacterial soap the morning of your procedure.  Build-up your immune system: Take 1000 mg of Vitamin C with every meal (3 times a day) the day prior to your procedure. Antibiotics: Inform the nursing staff if you are taking any antibiotics or if you have any conditions that may require antibiotics prior to procedures. (Example: recent joint implants)   Pregnancy: If you are pregnant make sure to notify the nursing staff. Not doing so may result in injury to the fetus, including death.  Sickness: If you have a cold, fever, or any active infections, call and cancel or reschedule your procedure. Receiving steroids while having an infection may result in complications. Arrival: You must be in the facility at least 30 minutes prior to your scheduled procedure. Tardiness: Your scheduled time is also the cutoff time. If you do not arrive at least 15 minutes prior to your procedure, you will be rescheduled.  Children: Do not bring any children with you. Make arrangements to keep them home. Dress appropriately: There is always a possibility that your clothing may get soiled. Avoid long dresses. Valuables: Do not bring any jewelry or valuables.  Reasons to call and reschedule or cancel your procedure: (Following these recommendations will minimize the risk of a serious complication.) Surgeries: Avoid having procedures within 2 weeks of any surgery. (Avoid for 2 weeks before or after any surgery). Flu Shots: Avoid having procedures within 2 weeks of a flu shots or . (Avoid for 2 weeks before or after immunizations). Barium: Avoid having a procedure within 7-10 days after having had a radiological study involving the use of radiological contrast. (  Myelograms, Barium swallow or enema study). Heart attacks: Avoid any elective procedures or surgeries for the initial 6 months after a "Myocardial Infarction" (Heart Attack). Blood  thinners: It is imperative that you stop these medications before procedures. Let us know if you if you take any blood thinner.  Infection: Avoid procedures during or within two weeks of an infection (including chest colds or gastrointestinal problems). Symptoms associated with infections include: Localized redness, fever, chills, night sweats or profuse sweating, burning sensation when voiding, cough, congestion, stuffiness, runny nose, sore throat, diarrhea, nausea, vomiting, cold or Flu symptoms, recent or current infections. It is specially important if the infection is over the area that we intend to treat. Heart and lung problems: Symptoms that may suggest an active cardiopulmonary problem include: cough, chest pain, breathing difficulties or shortness of breath, dizziness, ankle swelling, uncontrolled high or unusually low blood pressure, and/or palpitations. If you are experiencing any of these symptoms, cancel your procedure and contact your primary care physician for an evaluation.  Remember:  Regular Business hours are:  Monday to Thursday 8:00 AM to 4:00 PM  Provider's Schedule: Francisco Naveira, MD:  Procedure days: Tuesday and Thursday 7:30 AM to 4:00 PM  Bilal Lateef, MD:  Procedure days: Monday and Wednesday 7:30 AM to 4:00 PM 

## 2023-05-03 NOTE — Telephone Encounter (Signed)
Call to patient to discuss pre-procedure instructions. Patient desires to have sedation. Patient verbalized understanding of instructions.

## 2023-05-04 ENCOUNTER — Encounter: Payer: Self-pay | Admitting: Obstetrics and Gynecology

## 2023-05-04 ENCOUNTER — Telehealth: Payer: Self-pay

## 2023-05-04 NOTE — Progress Notes (Signed)
I sent the patient a letter regarding her results below. Given her medical issues colposcopy and directed biopsies are not possible. Plan to repeat Pap/HPV in 6 to 12 months.  [name] Autumn Jungling, MD    Status: Final result     Dx: VAIN (vaginal intraepithelial neoplas...   Test Result Released: No (inaccessible in MyChart)       Component Ref Range & Units (hover) 2 wk ago 1 yr ago  Interpretation EPCA,ASCHB Abnormal  NILM CM  Comment: EPITHELIAL CELL ABNORMALITY. ATYPICAL SQUAMOUS CELLS, CANNOT EXCLUDE HIGH-GRADE SQUAMOUS INTRAEPITHELIAL LESION (ASC-H).  Category ASC-H Abnormal  NIL CM  Comment: Atypical Squamous Cells; Cannot Rule Out High-Grade  Adequacy SDER SDER CM  Comment: Satisfactory for evaluation.  Clinician Provided ICD10 Comment   Comment: N89.3 D07.1  Performed by: Comment Comment CM  Comment: Laneta Simmers, Cytotechnologist (ASCP)  Electronically signed by: Comment   Comment: Romie Jumper, MD, Pathologist  PATHOLOGIST PROVIDED ICD10: Comment

## 2023-06-07 ENCOUNTER — Other Ambulatory Visit: Payer: Self-pay | Admitting: Internal Medicine

## 2023-06-07 DIAGNOSIS — R2242 Localized swelling, mass and lump, left lower limb: Secondary | ICD-10-CM

## 2023-06-09 ENCOUNTER — Ambulatory Visit
Admission: RE | Admit: 2023-06-09 | Discharge: 2023-06-09 | Disposition: A | Discharge status: Home / Self Care | Payer: Medicare PPO | Source: Ambulatory Visit | Attending: Internal Medicine | Admitting: Internal Medicine

## 2023-06-09 DIAGNOSIS — R2242 Localized swelling, mass and lump, left lower limb: Secondary | ICD-10-CM

## 2023-07-07 ENCOUNTER — Ambulatory Visit: Payer: Medicare PPO | Admitting: Pulmonary Disease

## 2023-07-08 ENCOUNTER — Other Ambulatory Visit: Payer: Self-pay | Admitting: Pulmonary Disease

## 2023-07-12 ENCOUNTER — Encounter: Payer: Self-pay | Admitting: Pulmonary Disease

## 2023-07-12 ENCOUNTER — Ambulatory Visit (INDEPENDENT_AMBULATORY_CARE_PROVIDER_SITE_OTHER): Admitting: Pulmonary Disease

## 2023-07-12 VITALS — BP 148/68 | HR 69 | Temp 97.6°F | Ht 67.0 in | Wt 108.8 lb

## 2023-07-12 DIAGNOSIS — I272 Pulmonary hypertension, unspecified: Secondary | ICD-10-CM | POA: Diagnosis not present

## 2023-07-12 DIAGNOSIS — J9611 Chronic respiratory failure with hypoxia: Secondary | ICD-10-CM | POA: Diagnosis not present

## 2023-07-12 DIAGNOSIS — I340 Nonrheumatic mitral (valve) insufficiency: Secondary | ICD-10-CM

## 2023-07-12 DIAGNOSIS — J449 Chronic obstructive pulmonary disease, unspecified: Secondary | ICD-10-CM | POA: Diagnosis not present

## 2023-07-12 DIAGNOSIS — B0229 Other postherpetic nervous system involvement: Secondary | ICD-10-CM

## 2023-07-12 DIAGNOSIS — D860 Sarcoidosis of lung: Secondary | ICD-10-CM

## 2023-07-12 NOTE — Patient Instructions (Signed)
 Your lungs sounded clear today.  We are going to reassess your lung function and your heart function with breathing tests and also with a heart test called echocardiogram.  We will see you in follow-up in 3 to 4 months time call sooner should any new problems arise.

## 2023-07-12 NOTE — Progress Notes (Signed)
 Subjective:    Patient ID: Autumn [name], female    DOB: 01-10-1946, 78 y.o.   MRN: [id number]  Patient Care Team: Enid Baas, MD as PCP - General (Internal Medicine) Salena Saner, MD as Consulting Physician (Pulmonary Disease)  Chief Complaint  Patient presents with  . Follow-up    BACKGROUND/INTERVAL: 78 year old female, former smoker quit 1988.  Past medical history significant for pulmonary hypertension, COPD, pulmonary sarcoidosis, chronic respiratory failure, ovarian cancer.   HPI Discussed the use of AI scribe software for clinical note transcription with the patient, who gave verbal consent to proceed.  History of Present Illness Autumn [name] is a 78 year old female with COPD and sarcoidosis who presents for follow-up of her respiratory condition.  Her breathing has been stable, and her oxygen unit is functioning well. She uses Trelegy once daily, which is effective unless she forgets to use it. She also takes Zyrtec to manage symptoms related to pollen exposure. No significant problems with pollen recently.  Her oxygen levels are reported to be very good. She recalls having an echocardiogram in October 2023 but does not remember the exact date. There have been issues with scheduling breathing tests at the hospital, which are pending.  She experiences significant back pain, which sometimes limits her activity. She is under the care of her primary care doctor and a pain specialist for this issue. 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   DATA: Pulmonary function data obtained from [facility] done on 24 Aug 2021 are as follows: SPIROMETRY: FVC was 0.68 Liters, 26% of predicted FEV1 was 0.53 Liters, 27% of predicted FEV1 ratio was 78.26%, 101% of predicted FEF 25-75% Liters per second was 0.44, 25% of predicted DIFFUSION CAPACITY: DLCO was 7.14 mL/min/mmHg VA was 2.90 Liters DLCO/VA was 2.49  mL/min/mmHg/L LUNG VOLUMES: TLC was 3.43 Liters RV was 2.75 Liters Impression Spirometry is c/w more so of restrictive TLC is mildly decreased DLCO is severely decreased, dlco/va is moderately decreased  Study interpreted by Dr. Ned Clines.   12/22/2021 PFTs [facility]: FEV1 0.61 L or 27% predicted, FVC 1.19 L or 39% predicted, FEV1/FVC 51% lung volumes moderately reduced diffusion capacity severely reduced.  Compared to prior study of 24 Aug 2021 has been some slight improvement on spirometric values but worsening of diffusion capacity. 01/13/2023 echocardiogram: LVEF 70 to 75%, hyperdynamic left ventricular function.  No wall motion abnormalities.  Moderate left ventricular hypertrophy, indeterminate diastolics.  The RV size is normal, there is severely increased right ventricular wall thickness, RV function is normal.  Mildly elevated pulmonary artery systolic pressure.  Estimated right ventricular systolic pressure is 40.2 mmHg.  Mild mitral regurgitation.  Mild to moderate pulmonic valve regurgitation.    Review of Systems A 10 point review of systems was performed and it is as noted above otherwise negative.   Patient Active Problem List   Diagnosis Date Noted  . Hyperlipidemia 05/05/2020  . Hypertension 05/05/2020  . OP (osteoporosis) 05/05/2020  . Pain due to onychomycosis of toenails of both feet 05/05/2020  . Lumbar facet arthropathy 11/06/2019  . Neuropathic pain 12/14/2018  . Chronic use of opiate for therapeutic purpose 12/14/2018  . Chronic pain syndrome 12/14/2018  . Thoracic radiculopathy 12/14/2018  . 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 Depression, prolonged 04/07/2018  . Chronic respiratory failure (HCC)   . Palliative care encounter   . Protein-calorie malnutrition, severe 02/06/2018  . Acute respiratory failure (HCC) 02/04/2018  . COPD (chronic obstructive pulmonary  disease) (HCC) 03/22/2017  . Post herpetic neuralgia 07/30/2016  . Hypercalcemia 02/17/2016  . Chronic  angle-closure glaucoma of both eyes, severe stage 01/20/2016  . Age-related nuclear cataract of both eyes 12/17/2015  . Pneumonia 11/17/2015  . Long term current use of systemic steroids 02/14/2015  . Pulmonary hypertension (HCC) 10/05/2013  . 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 Abnormal Pap smear of vagina 05/31/2012  . 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 Ovarian cancer (HCC) 12/11/2009  . 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    Social History   Tobacco Use  . Smoking status: Former    Current packs/day: 0.00    Types: Cigarettes    Start date: 46    Quit date: 1988    Years since quitting: 37.2  . Smokeless tobacco: Never  . Tobacco comments:    Quit smoking between age [age]. 1 pack lasted >2 weeks.  Substance Use Topics  . Alcohol use: No    Allergies  Allergen Reactions  . Ivp Dye [Iodinated Contrast Media] Hives and Itching    Current Meds  Medication Sig  . acetaminophen (TYLENOL) 500 MG tablet Take 500 mg by mouth every 4 (four) hours as needed for mild pain or moderate pain.  [name] albuterol (PROVENTIL) (2.5 MG/3ML) 0.083% nebulizer solution INHALE 3 MLS BY NEBULIZATION EVERY 6 (SIX) HOURS AS NEEDED FOR WHEEZING  . albuterol (VENTOLIN HFA) 108 (90 Base) MCG/ACT inhaler INHALE 2 PUFFS BY MOUTH EVERY 6 HOURS AS NEEDED FOR WHEEZE  . amLODipine (NORVASC) 5 MG tablet Take 2.5 mg by mouth daily.  [name] atenolol (TENORMIN) 50 MG tablet Take 50 mg by mouth 2 (two) times daily.   [name] atorvastatin (LIPITOR) 10 MG tablet Take 1 tablet by mouth daily.   . cetirizine (ZYRTEC) 10 MG tablet Take 10 mg by mouth as needed for allergies.  [name] denosumab (PROLIA) 60 MG/ML SOSY injection Inject 60 mg into the skin every 6 (six) months.  [name] ipratropium-albuterol (DUONEB) 0.5-2.5 (3) MG/3ML SOLN Take 3 mLs by nebulization every 6 (six) hours as needed.  [name] losartan (COZAAR) 50 MG tablet Take 1 tablet by mouth daily.  . mirtazapine  (REMERON) 7.5 MG tablet Take 7.5 mg by mouth at bedtime.  . ondansetron (ZOFRAN-ODT) 4 MG disintegrating tablet Take 1 tablet (4 mg total) by mouth every 8 (eight) hours as needed.  . polyethylene glycol powder (GLYCOLAX/MIRALAX) 17 GM/SCOOP powder Take by mouth.  . predniSONE (DELTASONE) 5 MG tablet TAKE 1 TABLET (5 MG TOTAL) BY MOUTH DAILY.  [name] Propylene Glycol 0.6 % SOLN Place 1 drop into both eyes daily as needed (dryness).  . sertraline (ZOLOFT) 25 MG tablet Take 25 mg by mouth daily.  Harrel Carina ELLIPTA 200-62.5-25 MCG/ACT AEPB TAKE 1 PUFF BY MOUTH EVERY DAY    Immunization History  Administered Date(s) Administered  . Fluad Quad(high Dose 65+) 12/22/2020, 02/04/2023  . Influenza Inj Mdck Quad Pf 01/04/2022  . Influenza Split 12/17/2016  . Influenza, High Dose Seasonal PF 01/03/2017, 12/16/2017, 12/22/2018  . Influenza-Unspecified 01/28/2011, 01/10/2012, 03/12/2013, 02/09/2014, 02/04/2015, 01/25/2016, 12/22/2018, 12/20/2019  . PFIZER Comirnaty(Gray Top)Covid-19 Tri-Sucrose Vaccine 05/15/2019, 06/12/2019  . PFIZER(Purple Top)SARS-COV-2 Vaccination 05/15/2019, 06/12/2019  . Pneumococcal Conjugate-13 02/17/2016  . Pneumococcal Polysaccharide-23 05/03/2011  . Zoster Recombinant(Shingrix) 10/14/2017, 12/16/2017        Objective:     Pulse 69   SpO2 100%   SpO2: 100 % O2 Device: Nasal cannula O2 Flow Rate (L/min): 2 L/min O2 Type: Pulse O2  GENERAL: Well-groomed woman, presents ambulating with help of a cane.  On supplemental oxygen at 2 L/min. HEAD: Normocephalic, atraumatic.  EYES: Pupils equal, round, reactive to light.  No scleral icterus.  MOUTH: Poor dentition, oral mucosa moist. NECK: Supple. No thyromegaly. Trachea midline. No JVD.  No adenopathy. PULMONARY: Good air entry bilaterally.  No adventitious sounds. CARDIOVASCULAR: S1 and S2. Regular rate and rhythm.  Loud mitral regurgitation murmur. ABDOMEN: Benign. MUSCULOSKELETAL: Significant dextro scoliosis, no  clubbing, no edema.  NEUROLOGIC: No overt focal deficit. SKIN: Intact,warm,dry. PSYCH: Flat affect, normal behavior          Assessment & Plan:   No diagnosis found.  No orders of the defined types were placed in this encounter.   No orders of the defined types were placed in this encounter.   Assessment & Plan Chronic Obstructive Pulmonary Disease (COPD) COPD is well-managed with Trelegy, maintaining good oxygen levels without significant allergen issues. Lung function monitoring is necessary due to previous scheduling issues with pulmonary function tests. - Schedule pulmonary function tests - Continue Trelegy as prescribed  Pulmonary Hypertension Pulmonary hypertension, likely secondary to sarcoidosis and COPD, contributes to dyspnea. Regular monitoring of pulmonary artery pressure is required. An echocardiogram is needed to evaluate heart valve and pulmonary artery pressure. - Order echocardiogram to evaluate heart valve and pulmonary artery pressure  Sarcoidosis Sarcoidosis contributes to pulmonary hypertension and oxygen requirement, necessitating continuous monitoring. - Monitor sarcoidosis in conjunction with pulmonary hypertension management  Postherpetic Neuralgia Significant back pain due to postherpetic neuralgia affects her activity level. She is under the care of her primary care physician and a pain specialist. - Continue management with primary care physician and pain specialist  Follow-up She prefers six-month follow-ups, but due to the need for close monitoring of pulmonary conditions, a shorter interval is recommended. - Schedule follow-up appointment in 3 to 4 months to reassess COPD and pulmonary hypertension      Advised if symptoms do not improve or worsen, to please contact office for sooner follow up or seek emergency care.    I spent xxx minutes of dedicated to the care of this patient on the date of this encounter to include pre-visit review  of records, face-to-face time with the patient discussing conditions above, post visit ordering of testing, clinical documentation with the electronic health record, making appropriate referrals as documented, and communicating necessary findings to members of the patients care team.     C. Danice Goltz, MD Advanced Bronchoscopy P[facility]    *This note was generated using voice recognition software/Dragon and/or AI transcription program.  Despite best efforts to proofread, errors can occur which can change the meaning. Any transcriptional errors that result from this process are unintentional and may not be fully corrected at the time of dictation.

## 2023-07-13 ENCOUNTER — Encounter: Payer: Self-pay | Admitting: Pulmonary Disease

## 2023-07-20 ENCOUNTER — Ambulatory Visit
Admission: RE | Admit: 2023-07-20 | Discharge: 2023-07-20 | Disposition: A | Discharge status: Home / Self Care | Source: Ambulatory Visit | Attending: Internal Medicine | Admitting: Internal Medicine

## 2023-07-20 ENCOUNTER — Other Ambulatory Visit: Payer: Self-pay | Admitting: Internal Medicine

## 2023-07-20 DIAGNOSIS — M7989 Other specified soft tissue disorders: Secondary | ICD-10-CM | POA: Insufficient documentation

## 2023-07-26 ENCOUNTER — Other Ambulatory Visit: Payer: Self-pay | Admitting: Internal Medicine

## 2023-07-26 DIAGNOSIS — Z1231 Encounter for screening mammogram for malignant neoplasm of breast: Secondary | ICD-10-CM

## 2023-08-09 ENCOUNTER — Telehealth: Payer: Self-pay | Admitting: *Deleted

## 2023-08-09 NOTE — Telephone Encounter (Signed)
 Patient called in re; procedure for tomorrow and which medications she should or should not take.  She will take BP meds only in the morning, the ones she normally takes and will hold off on others til after procedure.

## 2023-08-10 ENCOUNTER — Ambulatory Visit
Admission: RE | Admit: 2023-08-10 | Discharge: 2023-08-10 | Disposition: A | Discharge status: Home / Self Care | Source: Ambulatory Visit | Attending: Student in an Organized Health Care Education/Training Program | Admitting: Student in an Organized Health Care Education/Training Program

## 2023-08-10 ENCOUNTER — Ambulatory Visit
Attending: Student in an Organized Health Care Education/Training Program | Admitting: Student in an Organized Health Care Education/Training Program

## 2023-08-10 DIAGNOSIS — M47816 Spondylosis without myelopathy or radiculopathy, lumbar region: Secondary | ICD-10-CM | POA: Diagnosis present

## 2023-08-10 DIAGNOSIS — G894 Chronic pain syndrome: Secondary | ICD-10-CM | POA: Diagnosis not present

## 2023-08-10 MED ORDER — MIDAZOLAM HCL 2 MG/2ML IJ SOLN
0.5000 mg | Freq: Once | INTRAMUSCULAR | Status: AC
  Administered 2023-08-10: 1 mg via INTRAVENOUS
  Filled 2023-08-10: qty 2

## 2023-08-10 MED ORDER — LIDOCAINE HCL 2 % IJ SOLN
20.0000 mL | Freq: Once | INTRAMUSCULAR | Status: AC
  Administered 2023-08-10: 400 mg
  Filled 2023-08-10: qty 40

## 2023-08-10 MED ORDER — LACTATED RINGERS IV SOLN: Freq: Once | INTRAVENOUS | Status: AC

## 2023-08-10 MED ORDER — ROPIVACAINE HCL 2 MG/ML IJ SOLN
18.0000 mL | Freq: Once | INTRAMUSCULAR | Status: AC
  Administered 2023-08-10: 18 mL via PERINEURAL
  Filled 2023-08-10: qty 20

## 2023-08-10 MED ORDER — DEXAMETHASONE SODIUM PHOSPHATE 10 MG/ML IJ SOLN
20.0000 mg | Freq: Once | INTRAMUSCULAR | Status: AC
  Administered 2023-08-10: 20 mg
  Filled 2023-08-10: qty 2

## 2023-08-10 NOTE — Patient Instructions (Signed)

## 2023-08-10 NOTE — Progress Notes (Signed)
 Safety precautions to be maintained throughout the outpatient stay will include: orient to surroundings, keep bed in low position, maintain call bell within reach at all times, provide assistance with transfer out of bed and ambulation.

## 2023-08-10 NOTE — Progress Notes (Signed)
 PROVIDER NOTE: Interpretation of information contained herein should be left to medically-trained personnel. Specific patient instructions are provided elsewhere under "Patient Instructions" section of medical record. This document was created in part using STT-dictation technology, any transcriptional errors that may result from this process are unintentional.  Patient: Autumn [name] Type: Established DOB: [birth year] MRN: [id number] PCP: Rex Castor, MD  Service: Procedure DOS: 08/10/2023 Setting: Ambulatory Location: Ambulatory outpatient facility Delivery: Face-to-face Provider: Cephus Collin, MD Specialty: Interventional Pain Management Specialty designation: 09 Location: Outpatient facility Ref. Prov.: Cephus Collin, MD       Interventional Therapy   Procedure: Lumbar Facet, Medial Branch Radiofrequency Ablation (RFA)  Laterality: Bilateral (-50)  Level: L3, L4, and L5 Medial Branch Level(s). These levels will denervate the L3-4 and L4-5 lumbar facet joints.  Imaging: Fluoroscopy-guided         Anesthesia: Local anesthesia (1-2% Lidocaine ) Sedation: Minimal Sedation                       DOS: 08/10/2023  Performed by: Cephus Collin, MD  Purpose: Therapeutic/Palliative Indications: Low back pain severe enough to impact quality of life or function. Indications: 1. Lumbar facet arthropathy   2. Spondylosis without myelopathy or radiculopathy, lumbar region   3. Chronic pain syndrome    [name] has been dealing with the above chronic pain for longer than three months and has either failed to respond, was unable to tolerate, or simply did not get enough benefit from other more conservative therapies including, but not limited to: 1. Over-the-counter medications 2. Anti-inflammatory medications 3. Muscle relaxants 4. Membrane stabilizers 5. Opioids 6. Physical therapy and/or chiropractic manipulation 7. Modalities (Heat, ice, etc.) 8. Invasive techniques such as nerve  blocks. [name] has attained more than 50% relief of the pain from a series of diagnostic injections conducted in separate occasions.  Pain Score: Pre-procedure: 7 /10 Post-procedure: 0-No pain/10     Position / Prep / Materials:  Position: Prone  Prep solution: ChloraPrep (2% chlorhexidine  gluconate and 70% isopropyl alcohol) Prep Area: Entire Lumbosacral Region (Lower back from mid-thoracic region to end of tailbone and from flank to flank.) Materials:  Tray: RFA (Radiofrequency) tray Needle(s):  Type: RFA (Teflon-coated radiofrequency ablation needles) Gauge (G): 22  Length: Regular (10cm) Qty: 3     H&P (Pre-op Assessment):  [name] is a 78 y.o. (year old), female patient, seen today for interventional treatment. She  has a past surgical history that includes Abdominal hysterectomy; Dilation and curettage of uterus; Vulvar lesion removal; Diagnostic laparoscopy; Eye surgery; Colonoscopy with propofol  (N/A, 07/30/2019); and Breast excisional biopsy (Right). [name] has a current medication list which includes the following prescription(s): acetaminophen , albuterol , albuterol , amlodipine, atenolol , atorvastatin , cetirizine, denosumab, ipratropium-albuterol , losartan, mirtazapine , ondansetron , polyethylene glycol powder, prednisone , propylene glycol, sertraline , and trelegy ellipta , and the following Facility-Administered Medications: lactated ringers. Her primarily concern today is the Back Pain (lower)  Initial Vital Signs:  Pulse/HCG Rate: 66ECG Heart Rate: 70 Temp: 97.6 F (36.4 C) Resp: 18 BP: (!) 178/80 SpO2: 99 % (2L)  BMI: Estimated body mass index is 16.92 kg/m as calculated from the following:   Height as of this encounter: 5\' 7"  (1.702 m).   Weight as of this encounter: 108 lb (49 kg).  Risk Assessment: Allergies: Reviewed. She is allergic to ivp dye [iodinated contrast media].  Allergy Precautions: None required Coagulopathies: Reviewed. None identified.   Blood-thinner therapy: None at this time Active Infection(s): Reviewed. None identified. [name] is  afebrile  Site Confirmation: [name] was asked to confirm the procedure and laterality before marking the site Procedure checklist: Completed Consent: Before the procedure and under the influence of no sedative(s), amnesic(s), or anxiolytics, the patient was informed of the treatment options, risks and possible complications. To fulfill our ethical and legal obligations, as recommended by the American Medical Association's Code of Ethics, I have informed the patient of my clinical impression; the nature and purpose of the treatment or procedure; the risks, benefits, and possible complications of the intervention; the alternatives, including doing nothing; the risk(s) and benefit(s) of the alternative treatment(s) or procedure(s); and the risk(s) and benefit(s) of doing nothing. The patient was provided information about the general risks and possible complications associated with the procedure. These may include, but are not limited to: failure to achieve desired goals, infection, bleeding, organ or nerve damage, allergic reactions, paralysis, and death. In addition, the patient was informed of those risks and complications associated to Spine-related procedures, such as failure to decrease pain; infection (i.e.: Meningitis, epidural or intraspinal abscess); bleeding (i.e.: epidural hematoma, subarachnoid hemorrhage, or any other type of intraspinal or peri-dural bleeding); organ or nerve damage (i.e.: Any type of peripheral nerve, nerve root, or spinal cord injury) with subsequent damage to sensory, motor, and/or autonomic systems, resulting in permanent pain, numbness, and/or weakness of one or several areas of the body; allergic reactions; (i.e.: anaphylactic reaction); and/or death. Furthermore, the patient was informed of those risks and complications associated with the medications. These  include, but are not limited to: allergic reactions (i.e.: anaphylactic or anaphylactoid reaction(s)); adrenal axis suppression; blood sugar elevation that in diabetics may result in ketoacidosis or comma; water retention that in patients with history of congestive heart failure may result in shortness of breath, pulmonary edema, and decompensation with resultant heart failure; weight gain; swelling or edema; medication-induced neural toxicity; particulate matter embolism and blood vessel occlusion with resultant organ, and/or nervous system infarction; and/or aseptic necrosis of one or more joints. Finally, the patient was informed that Medicine is not an exact science; therefore, there is also the possibility of unforeseen or unpredictable risks and/or possible complications that may result in a catastrophic outcome. The patient indicated having understood very clearly. We have given the patient no guarantees and we have made no promises. Enough time was given to the patient to ask questions, all of which were answered to the patient's satisfaction. Ms. Dansereau has indicated that she wanted to continue with the procedure. Attestation: I, the ordering provider, attest that I have discussed with the patient the benefits, risks, side-effects, alternatives, likelihood of achieving goals, and potential problems during recovery for the procedure that I have provided informed consent. Date  Time: 08/10/2023  9:58 AM  Pre-Procedure Preparation:  Monitoring: As per clinic protocol. Respiration, ETCO2, SpO2, BP, heart rate and rhythm monitor placed and checked for adequate function Safety Precautions: Patient was assessed for positional comfort and pressure points before starting the procedure. Time-out: I initiated and conducted the "Time-out" before starting the procedure, as per protocol. The patient was asked to participate by confirming the accuracy of the "Time Out" information. Verification of the correct  person, site, and procedure were performed and confirmed by me, the nursing staff, and the patient. "Time-out" conducted as per Joint Commission's Universal Protocol (UP.01.01.01). Time: 1032 Start Time: 1032 hrs.  Description of Procedure:          Laterality: See above. Levels:  See above. Safety Precautions: Aspiration looking for blood  return was conducted prior to all injections. At no point did we inject any substances, as a needle was being advanced. Before injecting, the patient was told to immediately notify me if she was experiencing any new onset of "ringing in the ears, or metallic taste in the mouth". No attempts were made at seeking any paresthesias. Safe injection practices and needle disposal techniques used. Medications properly checked for expiration dates. SDV (single dose vial) medications used. After the completion of the procedure, all disposable equipment used was discarded in the proper designated medical waste containers. Local Anesthesia: Protocol guidelines were followed. The patient was positioned over the fluoroscopy table. The area was prepped in the usual manner. The time-out was completed. The target area was identified using fluoroscopy. A 12-in long, straight, sterile hemostat was used with fluoroscopic guidance to locate the targets for each level blocked. Once located, the skin was marked with an approved surgical skin marker. Once all sites were marked, the skin (epidermis, dermis, and hypodermis), as well as deeper tissues (fat, connective tissue and muscle) were infiltrated with a small amount of a short-acting local anesthetic, loaded on a 10cc syringe with a 25G, 1.5-in  Needle. An appropriate amount of time was allowed for local anesthetics to take effect before proceeding to the next step. Technical description of process:  Radiofrequency Ablation (RFA)  L3 Medial Branch Nerve RFA: The target area for the L3 medial branch is at the junction of the  postero-lateral aspect of the superior articular process and the superior, posterior, and medial edge of the transverse process of L4. Under fluoroscopic guidance, a Radiofrequency needle was inserted until contact was made with os over the superior postero-lateral aspect of the pedicular shadow (target area). Sensory and motor testing was conducted to properly adjust the position of the needle. Once satisfactory placement of the needle was achieved, the numbing solution was slowly injected after negative aspiration for blood. 2.0 mL of the nerve block solution was injected without difficulty or complication. After waiting for at least 3 minutes, the ablation was performed. Once completed, the needle was removed intact. L4 Medial Branch Nerve RFA: The target area for the L4 medial branch is at the junction of the postero-lateral aspect of the superior articular process and the superior, posterior, and medial edge of the transverse process of L5. Under fluoroscopic guidance, a Radiofrequency needle was inserted until contact was made with os over the superior postero-lateral aspect of the pedicular shadow (target area). Sensory and motor testing was conducted to properly adjust the position of the needle. Once satisfactory placement of the needle was achieved, the numbing solution was slowly injected after negative aspiration for blood. 2.0 mL of the nerve block solution was injected without difficulty or complication. After waiting for at least 3 minutes, the ablation was performed. Once completed, the needle was removed intact. L5 Medial Branch Nerve RFA: The target area for the L5 medial branch is at the junction of the postero-lateral aspect of the superior articular process of S1 and the superior, posterior, and medial edge of the sacral ala. Under fluoroscopic guidance, a Radiofrequency needle was inserted until contact was made with os over the superior postero-lateral aspect of the pedicular shadow (target  area). Sensory and motor testing was conducted to properly adjust the position of the needle. Once satisfactory placement of the needle was achieved, the numbing solution was slowly injected after negative aspiration for blood. 2.0 mL of the nerve block solution was injected without difficulty or complication. After  waiting for at least 3 minutes, the ablation was performed. Once completed, the needle was removed intact.  Radiofrequency lesioning (ablation):  Radiofrequency Generator: Medtronic AccurianTM AG 1000 RF Generator Sensory Stimulation Parameters: 50 Hz was used to locate & identify the nerve, making sure that the needle was positioned such that there was no sensory stimulation below 0.3 V or above 0.7 V. Motor Stimulation Parameters: 2 Hz was used to evaluate the motor component. Care was taken not to lesion any nerves that demonstrated motor stimulation of the lower extremities at an output of less than 2.5 times that of the sensory threshold, or a maximum of 2.0 V. Lesioning Technique Parameters: Standard Radiofrequency settings. (Not bipolar or pulsed.) Temperature Settings: 80 degrees C Lesioning time: 60 seconds Stationary intra-operative compliance: Compliant  Once the entire procedure was completed, the treated area was cleaned, making sure to leave some of the prepping solution back to take advantage of its long term bactericidal properties.    Illustration of the posterior view of the lumbar spine and the posterior neural structures. Laminae of L2 through S1 are labeled. DPRL5, dorsal primary ramus of L5; DPRS1, dorsal primary ramus of S1; DPR3, dorsal primary ramus of L3; FJ, facet (zygapophyseal) joint L3-L4; I, inferior articular process of L4; LB1, lateral branch of dorsal primary ramus of L1; IAB, inferior articular branches from L3 medial branch (supplies L4-L5 facet joint); IBP, intermediate branch plexus; MB3, medial branch of dorsal primary ramus of L3; NR3, third lumbar  nerve root; S, superior articular process of L5; SAB, superior articular branches from L4 (supplies L4-5 facet joint also); TP3, transverse process of L3.  Facet Joint Innervation (* possible contribution)  L1-2 T12, L1 (L2*)  Medial Branch  L2-3 L1, L2 (L3*)         "          "  L3-4 L2, L3 (L4*)         "          "  L4-5 L3, L4 (L5*)         "          "  L5-S1 L4, L5, S1          "          "    Vitals:   08/10/23 1045 08/10/23 1050 08/10/23 1055 08/10/23 1102  BP: (!) 171/78 (!) 163/77 (!) 169/81 (!) 129/59  Pulse:      Resp: (!) 21 (!) 22 19 16   Temp:      SpO2: 100% 100% 100% 99%  Weight:      Height:        Start Time: 1032 hrs. End Time: 1055 hrs.  Imaging Guidance (Spinal):          Type of Imaging Technique: Fluoroscopy Guidance (Spinal) Indication(s): Fluoroscopy guidance for needle placement to enhance accuracy in procedures requiring precise needle localization for targeted delivery of medication in or near specific anatomical locations not easily accessible without such real-time imaging assistance. Exposure Time: Please see nurses notes. Contrast: None used. Fluoroscopic Guidance: I was personally present during the use of fluoroscopy. "Tunnel Vision Technique" used to obtain the best possible view of the target area. Parallax error corrected before commencing the procedure. "Direction-depth-direction" technique used to introduce the needle under continuous pulsed fluoroscopy. Once target was reached, antero-posterior, oblique, and lateral fluoroscopic projection used confirm needle placement in all planes. Images permanently stored in EMR. Interpretation: No contrast injected. I personally interpreted  the imaging intraoperatively. Adequate needle placement confirmed in multiple planes. Permanent images saved into the patient's record.  Antibiotic Prophylaxis:   Anti-infectives (From admission, onward)    None      Indication(s): None  identified  Post-operative Assessment:  Post-procedure Vital Signs:  Pulse/HCG Rate: 6664 Temp: 97.6 F (36.4 C) Resp: 16 BP: (!) 129/59 SpO2: 99 %  EBL: None  Complications: No immediate post-treatment complications observed by team, or reported by patient.  Note: The patient tolerated the entire procedure well. A repeat set of vitals were taken after the procedure and the patient was kept under observation following institutional policy, for this type of procedure. Post-procedural neurological assessment was performed, showing return to baseline, prior to discharge. The patient was provided with post-procedure discharge instructions, including a section on how to identify potential problems. Should any problems arise concerning this procedure, the patient was given instructions to immediately contact us , at any time, without hesitation. In any case, we plan to contact the patient by telephone for a follow-up status report regarding this interventional procedure.  Comments:  No additional relevant information.  Plan of Care (POC)  Orders:  Orders Placed This Encounter  Procedures   DG PAIN [facility] C-ARM 1-60 MIN NO REPORT    Intraoperative interpretation by procedural physician at [facility].    Standing Status:   Standing    Number of Occurrences:   1    Reason for exam::   Assistance in needle guidance and placement for procedures requiring needle placement in or near specific anatomical locations not easily accessible without such assistance.    Medications ordered for procedure: Meds ordered this encounter  Medications   lidocaine  (XYLOCAINE ) 2 % (with pres) injection 400 mg   lactated ringers infusion   midazolam  (VERSED ) injection 0.5-2 mg    Make sure Flumazenil is available in the pyxis when using this medication. If oversedation occurs, administer 0.2 mg IV over 15 sec. If after 45 sec no response, administer 0.2 mg again over 1 min; may repeat at 1 min  intervals; not to exceed 4 doses (1 mg)   ropivacaine  (PF) 2 mg/mL (0.2%) (NAROPIN ) injection 18 mL   dexamethasone  (DECADRON ) injection 20 mg   Medications administered: We administered lidocaine , lactated ringers, midazolam , ropivacaine  (PF) 2 mg/mL (0.2%), and dexamethasone .  See the medical record for exact dosing, route, and time of administration.  Follow-up plan:   Return in about 11 weeks (around 10/26/2023) for PPE, VV.       Recent Visits No visits were found meeting these conditions. Showing recent visits within past 90 days and meeting all other requirements Today's Visits Date Type Provider Dept  08/10/23 Procedure visit Cephus Collin, MD [facility]  Showing today's visits and meeting all other requirements Future Appointments Date Type Provider Dept  10/26/23 Appointment Cephus Collin, MD [facility]  Showing future appointments within next 90 days and meeting all other requirements  Disposition: Discharge home  Discharge (Date  Time): 08/10/2023; 1115 hrs.   Primary Care Physician: Rex Castor, MD Location: [facility] Note by: Cephus Collin, MD (TTS technology used. I apologize for any typographical errors that were not detected and corrected.) Date: 08/10/2023; Time: 11:07 AM  Disclaimer:  Medicine is not an Visual merchandiser. The only guarantee in medicine is that nothing is guaranteed. It is important to note that the decision to proceed with this intervention was based on the information collected from the patient. The Data and  conclusions were drawn from the patient's questionnaire, the interview, and the physical examination. Because the information was provided in large part by the patient, it cannot be guaranteed that it has not been purposely or unconsciously manipulated. Every effort has been made to obtain as much relevant data as possible for this evaluation. It is important to note that the conclusions  that lead to this procedure are derived in large part from the available data. Always take into account that the treatment will also be dependent on availability of resources and existing treatment guidelines, considered by other Pain Management Practitioners as being common knowledge and practice, at the time of the intervention. For Medico-Legal purposes, it is also important to point out that variation in procedural techniques and pharmacological choices are the acceptable norm. The indications, contraindications, technique, and results of the above procedure should only be interpreted and judged by a Board-Certified Interventional Pain Specialist with extensive familiarity and expertise in the same exact procedure and technique.

## 2023-08-11 ENCOUNTER — Telehealth: Payer: Self-pay | Admitting: *Deleted

## 2023-08-11 NOTE — Telephone Encounter (Signed)
 Attempted to call for post procedure follow-up. Message left.

## 2023-08-16 ENCOUNTER — Encounter: Payer: Self-pay | Admitting: Student in an Organized Health Care Education/Training Program

## 2023-08-16 ENCOUNTER — Ambulatory Visit (HOSPITAL_BASED_OUTPATIENT_CLINIC_OR_DEPARTMENT_OTHER): Admitting: Student in an Organized Health Care Education/Training Program

## 2023-08-16 VITALS — BP 158/68 | HR 78 | Temp 96.4°F | Resp 16 | Ht 67.0 in | Wt 108.0 lb

## 2023-08-16 DIAGNOSIS — G894 Chronic pain syndrome: Secondary | ICD-10-CM

## 2023-08-16 DIAGNOSIS — B0229 Other postherpetic nervous system involvement: Secondary | ICD-10-CM

## 2023-08-16 DIAGNOSIS — M47816 Spondylosis without myelopathy or radiculopathy, lumbar region: Secondary | ICD-10-CM

## 2023-08-16 DIAGNOSIS — M1612 Unilateral primary osteoarthritis, left hip: Secondary | ICD-10-CM

## 2023-08-16 DIAGNOSIS — M25552 Pain in left hip: Secondary | ICD-10-CM

## 2023-08-16 MED ORDER — HYDROCODONE-ACETAMINOPHEN 5-325 MG PO TABS: 1.0000 | ORAL_TABLET | Freq: Three times a day (TID) | ORAL | 0 refills | Status: AC | PRN

## 2023-08-16 NOTE — Progress Notes (Signed)
 PROVIDER NOTE: Interpretation of information contained herein should be left to medically-trained personnel. Specific patient instructions are provided elsewhere under "Patient Instructions" section of medical record. This document was created in part using AI and STT-dictation technology, any transcriptional errors that may result from this process are unintentional.  Patient: Autumn [name]  Service: E/M   PCP: Rex Castor, MD  DOB: [birth year]  DOS: 08/16/2023  Provider: Cephus Collin, MD  MRN: [id number]  Delivery: Face-to-face  Specialty: Interventional Pain Management  Type: Established Patient  Setting: Ambulatory outpatient facility  Specialty designation: 09  Referring Prov.: Rex Castor, MD  Location: Outpatient office facility       HPI  [name], a 78 y.o. year old female, is here today because of her Lumbar facet arthropathy [M47.816]. Ms. Yunk primary complain today is Back Pain (Lumbar bilateral ) and Other (PHN left side radiating around to the middle of the back. )  Pertinent problems: [name] has COPD (chronic obstructive pulmonary disease) (HCC); Post herpetic neuralgia; Pulmonary sarcoidosis (HCC); Chronic pain syndrome; Thoracic radiculopathy; and Lumbar facet arthropathy on their pertinent problem list. Pain Assessment: Severity of Chronic pain is reported as a 3 /10. Location: Back (left abdomen) Lower, Left, Right/PHN pain around to the back. Onset: More than a month ago. Quality: Discomfort, Constant, Burning (burns and stings on her side that is affected by PHN). Timing: Constant. Modifying factor(s): biofreeze. Vitals:  height is 5\' 7"  (1.702 m) and weight is 108 lb (49 kg). Her temporal temperature is 96.4 F (35.8 C) (abnormal). Her blood pressure is 158/68 (abnormal) and her pulse is 78. Her respiration is 16 and oxygen saturation is 100%.  BMI: Estimated body mass index is 16.92 kg/m as calculated from the following:   Height as of this  encounter: 5\' 7"  (1.702 m).   Weight as of this encounter: 108 lb (49 kg). Last encounter: 05/02/2023. Last procedure: 08/10/2023.  Reason for encounter: both, medication management and post-procedure evaluation and assessment.    Post-procedure evaluation    Procedure: Lumbar Facet, Medial Branch Radiofrequency Ablation (RFA)  Laterality: Bilateral (-50)  Level: L3, L4, and L5 Medial Branch Level(s). These levels will denervate the L3-4 and L4-5 lumbar facet joints.  Imaging: Fluoroscopy-guided         Anesthesia: Local anesthesia (1-2% Lidocaine ) Sedation: Minimal Sedation                       DOS: 08/10/2023  Performed by: Cephus Collin, MD  Purpose: Therapeutic/Palliative Indications: Low back pain severe enough to impact quality of life or function. Indications: 1. Lumbar facet arthropathy   2. Spondylosis without myelopathy or radiculopathy, lumbar region   3. Chronic pain syndrome    [name] has been dealing with the above chronic pain for longer than three months and has either failed to respond, was unable to tolerate, or simply did not get enough benefit from other more conservative therapies including, but not limited to: 1. Over-the-counter medications 2. Anti-inflammatory medications 3. Muscle relaxants 4. Membrane stabilizers 5. Opioids 6. Physical therapy and/or chiropractic manipulation 7. Modalities (Heat, ice, etc.) 8. Invasive techniques such as nerve blocks. [name] has attained more than 50% relief of the pain from a series of diagnostic injections conducted in separate occasions.  Pain Score: Pre-procedure: 7 /10 Post-procedure: 0-No pain/10   Effectiveness:  Initial hour after procedure: 100 %  Subsequent 4-6 hours post-procedure: 100 %  Analgesia past initial 6 hours:  (patient  is having intermittent pain.) 90% Ongoing improvement:  Analgesic:  90% Function: [name] reports improvement in function ROM: [name] reports improvement in  ROM     Pharmacotherapy Assessment  Analgesic: 06/26/2023 03/01/2023  1 Hydrocodone -Acetamin 5-325 Mg 90.00 30 Bi Lat [id number] Nor (1409) 0/0 15.00 MME Medicare [place]    Monitoring:  PMP: PDMP reviewed during this encounter.       Pharmacotherapy: No side-effects or adverse reactions reported. Compliance: No problems identified. Effectiveness: Clinically acceptable.  Malon Seamen, RN  08/16/2023 11:52 AM  Sign when Signing Visit Nursing Pain Medication Assessment:  Safety precautions to be maintained throughout the outpatient stay will include: orient to surroundings, keep bed in low position, maintain call bell within reach at all times, provide assistance with transfer out of bed and ambulation.  Medication Inspection Compliance: Pill count conducted under aseptic conditions, in front of the patient. Neither the pills nor the bottle was removed from the patient's sight at any time. Once count was completed pills were immediately returned to the patient in their original bottle.  Medication: Hydrocodone /APAP Pill/Patch Count:  0 of 90 pills remain Pill/Patch Appearance:  empty bottle  Bottle Appearance: Standard pharmacy container. Clearly labeled. Filled Date: 03 / 16 / 2025 Last Medication intake:  Today  No results found for: "CBDTHCR" No results found for: "D8THCCBX" No results found for: "D9THCCBX"  UDS:  Summary  Date Value Ref Range Status  06/22/2022 Note  Final    Comment:    ==================================================================== [employer] (MW) ==================================================================== Test                             Result       Flag       Units  Drug Present and Declared for Prescription Verification   Hydrocodone                     3026         EXPECTED   ng/mg creat   Hydromorphone                  148          EXPECTED   ng/mg creat   Dihydrocodeine                 357          EXPECTED   ng/mg creat    Norhydrocodone                 >3571        EXPECTED   ng/mg creat    Sources of hydrocodone  include scheduled prescription medications.    Hydromorphone, dihydrocodeine and norhydrocodone are expected    metabolites of hydrocodone . Hydromorphone and dihydrocodeine are    also available as scheduled prescription medications.  ==================================================================== Test                      Result    Flag   Units      Ref Range   Creatinine              140              mg/dL      >=14 ==================================================================== Declared Medications:  The flagging and interpretation on this report are based on the  following declared medications.  Unexpected results may arise from  inaccuracies in the declared medications.   **  Note: The testing scope of this panel includes these medications:   Hydrocodone  (Norco)   **Note: The testing scope of this panel does not include the  following reported medications:   Acetaminophen  (Tylenol )  Acetaminophen  (Norco)  Albuterol  (Ventolin  HFA)  Albuterol  (Duoneb)  Amlodipine (Norvasc)  Atenolol  (Tenormin )  Atorvastatin  (Lipitor)  Cetirizine (Zyrtec)  Fluticasone  (Flonase)  Fluticasone  (Trelegy)  Ipratropium (Duoneb)  Losartan (Cozaar)  Methylprednisolone  (Medrol )  Mirtazapine  (Remeron )  Ondansetron  (Zofran )  Polyethylene Glycol  Prednisone  (Deltasone )  Romosozumab (Evenity)  Sertraline  (Zoloft )  Umeclidinium (Trelegy)  Vilanterol (Trelegy) ==================================================================== For clinical consultation, please call [phone]. ====================================================================       ROS  Constitutional: Denies any fever or chills Gastrointestinal: No reported hemesis, hematochezia, vomiting, or acute GI distress Musculoskeletal:  Left mid axillary pain related to postherpetic neuralgia Neurological: No reported episodes  of acute onset apraxia, aphasia, dysarthria, agnosia, amnesia, paralysis, loss of coordination, or loss of consciousness  Medication Review  Fluticasone -Umeclidin-Vilant, HYDROcodone -acetaminophen , Propylene Glycol, acetaminophen , albuterol , amLODipine, atenolol , atorvastatin , cetirizine, denosumab, ipratropium-albuterol , losartan, mirtazapine , ondansetron , polyethylene glycol powder, predniSONE , and sertraline   History Review  Allergy: Ms. Grossman is allergic to ivp dye [iodinated contrast media]. Drug: Ms. Zaring  reports no history of drug use. Alcohol:  reports no history of alcohol use. Tobacco:  reports that she quit smoking about 37 years ago. Her smoking use included cigarettes. She started smoking about 57 years ago. She has never used smokeless tobacco. Social: Ms. Deeb  reports that she quit smoking about 37 years ago. Her smoking use included cigarettes. She started smoking about 57 years ago. She has never used smokeless tobacco. She reports that she does not drink alcohol and does not use drugs. Medical:  has a past medical history of Allergy, Anemia, Arthritis, COPD (chronic obstructive pulmonary disease) (HCC), Glaucoma, H/O uvulectomy, Hypertension, Osteoporosis, Ovarian cancer (HCC), Postherpetic neuralgia, Pulmonary sarcoidosis (HCC), and Sarcoid. Surgical: Ms. Betschart  has a past surgical history that includes Abdominal hysterectomy; Dilation and curettage of uterus; Vulvar lesion removal; Diagnostic laparoscopy; Eye surgery; Colonoscopy with propofol  (N/A, 07/30/2019); and Breast excisional biopsy (Right). Family: family history includes Breast cancer (age of onset: 13) in her mother; Cancer in her mother; Hypertension in her father.  Laboratory Chemistry Profile   Renal Lab Results  Component Value Date   BUN 29 (H) 06/26/2021   CREATININE 0.79 06/26/2021   GFRAA >60 02/13/2018   GFRNONAA >60 06/26/2021    Hepatic Lab Results  Component Value Date   AST 25 06/26/2021    ALT 23 06/26/2021   ALBUMIN 4.6 06/26/2021   ALKPHOS 42 06/26/2021   LIPASE 32 06/26/2021    Electrolytes Lab Results  Component Value Date   NA 138 06/26/2021   K 3.7 06/26/2021   CL 94 (L) 06/26/2021   CALCIUM  9.5 06/26/2021   MG 2.7 (H) 02/07/2018   PHOS 3.2 02/07/2018    Bone No results found for: "VD25OH", "VD125OH2TOT", "XB1478GN5", "AO1308MV7", "25OHVITD1", "25OHVITD2", "25OHVITD3", "TESTOFREE", "TESTOSTERONE"  Inflammation (CRP: Acute Phase) (ESR: Chronic Phase) Lab Results  Component Value Date   ESRSEDRATE 48 (H) 05/25/2011         Note: Above Lab results reviewed.  Recent Imaging Review  DG PAIN CLINIC C-ARM 1-60 MIN NO REPORT Fluoro was used, but no Radiologist interpretation will be provided.  Please refer to "NOTES" tab for provider progress note. Note: Reviewed        Physical Exam  General appearance: Well nourished, well developed, and well hydrated. In no apparent acute distress  Mental status: Alert, oriented x 3 (person, place, & time)       Respiratory: No evidence of acute respiratory distress Eyes: PERLA Vitals: BP (!) 158/68 (BP Location: Right Arm, Patient Position: Sitting, Cuff Size: Normal)   Pulse 78   Temp (!) 96.4 F (35.8 C) (Temporal)   Resp 16   Ht 5\' 7"  (1.702 m)   Wt 108 lb (49 kg)   SpO2 100% Comment: 2 liters  BMI 16.92 kg/m  BMI: Estimated body mass index is 16.92 kg/m as calculated from the following:   Height as of this encounter: 5\' 7"  (1.702 m).   Weight as of this encounter: 108 lb (49 kg). Ideal: Ideal body weight: 61.6 kg (135 lb 12.9 oz)  Left mid axillary pain related to postherpetic neuralgia  Improvement in low back pain after lumbar RFA  Assessment   Diagnosis Status  1. Lumbar facet arthropathy   2. Spondylosis without myelopathy or radiculopathy, lumbar region   3. Primary osteoarthritis of left hip   4. Left hip pain   5. Post herpetic neuralgia   6. Chronic pain syndrome     Controlled Controlled Controlled   Updated Problems: No problems updated.  Plan of Care  Excellent response to lumbar RFA, greater than 85% improvement in low back pain Continues to struggle with left postherpetic neuralgia in the left lateral thoracic region that radiates anteriorly to her mid axillary region.  Has tried Qutenza .  Has also tried DRG trial.  Will continue to monitor. Ms. JONELL BARGIEL has a current medication list which includes the following long-term medication(s): albuterol , albuterol , amlodipine, atenolol , atorvastatin , ipratropium-albuterol , losartan, and sertraline .  Pharmacotherapy (Medications Ordered): Meds ordered this encounter  Medications   HYDROcodone -acetaminophen  (NORCO/VICODIN) 5-325 MG tablet    Sig: Take 1 tablet by mouth 3 (three) times daily as needed for severe pain (pain score 7-10). Must last 30 days    Dispense:  90 tablet    Refill:  0    Chronic Pain: STOP Act (Not applicable) Fill 1 day early if closed on refill date. Avoid benzodiazepines within 8 hours of opioids   HYDROcodone -acetaminophen  (NORCO/VICODIN) 5-325 MG tablet    Sig: Take 1 tablet by mouth 3 (three) times daily as needed for severe pain (pain score 7-10). Must last 30 days    Dispense:  90 tablet    Refill:  0    Chronic Pain: STOP Act (Not applicable) Fill 1 day early if closed on refill date. Avoid benzodiazepines within 8 hours of opioids   Orders:  No orders of the defined types were placed in this encounter.  Follow-up plan:   Return for patient will call to schedule F2F appt prn.    Recent Visits Date Type Provider Dept  08/10/23 Procedure visit Cephus Collin, MD [facility]  Showing recent visits within past 90 days and meeting all other requirements Today's Visits Date Type Provider Dept  08/16/23 Office Visit Cephus Collin, MD [facility]  Showing today's visits and meeting all other requirements Future Appointments Date Type Provider  Dept  10/26/23 Appointment Cephus Collin, MD [facility]  Showing future appointments within next 90 days and meeting all other requirements  I discussed the assessment and treatment plan with the patient. The patient was provided an opportunity to ask questions and all were answered. The patient agreed with the plan and demonstrated an understanding of the instructions.  Patient advised to call back or seek an in-person evaluation if the  symptoms or condition worsens.  Duration of encounter: 30 minutes.  Total time on encounter, as per AMA guidelines included both the face-to-face and non-face-to-face time personally spent by the physician and/or other qualified health care professional(s) on the day of the encounter (includes time in activities that require the physician or other qualified health care professional and does not include time in activities normally performed by clinical staff). Physician's time may include the following activities when performed: Preparing to see the patient (e.g., pre-charting review of records, searching for previously ordered imaging, lab work, and nerve conduction tests) Review of prior analgesic pharmacotherapies. Reviewing PMP Interpreting ordered tests (e.g., lab work, imaging, nerve conduction tests) Performing post-procedure evaluations, including interpretation of diagnostic procedures Obtaining and/or reviewing separately obtained history Performing a medically appropriate examination and/or evaluation Counseling and educating the patient/family/caregiver Ordering medications, tests, or procedures Referring and communicating with other health care professionals (when not separately reported) Documenting clinical information in the electronic or other health record Independently interpreting results (not separately reported) and communicating results to the patient/ family/caregiver Care coordination (not separately reported)  Note by:  Cephus Collin, MD (TTS and AI technology used. I apologize for any typographical errors that were not detected and corrected.) Date: 08/16/2023; Time: 12:17 PM

## 2023-08-16 NOTE — Progress Notes (Signed)
 Nursing Pain Medication Assessment:  Safety precautions to be maintained throughout the outpatient stay will include: orient to surroundings, keep bed in low position, maintain call bell within reach at all times, provide assistance with transfer out of bed and ambulation.  Medication Inspection Compliance: Pill count conducted under aseptic conditions, in front of the patient. Neither the pills nor the bottle was removed from the patient's sight at any time. Once count was completed pills were immediately returned to the patient in their original bottle.  Medication: Hydrocodone /APAP Pill/Patch Count:  0 of 90 pills remain Pill/Patch Appearance:  empty bottle  Bottle Appearance: Standard pharmacy container. Clearly labeled. Filled Date: 03 / 16 / 2025 Last Medication intake:  Today

## 2023-08-17 ENCOUNTER — Ambulatory Visit
Admission: RE | Admit: 2023-08-17 | Discharge: 2023-08-17 | Disposition: A | Discharge status: Home / Self Care | Source: Ambulatory Visit | Attending: Pulmonary Disease | Admitting: Pulmonary Disease

## 2023-08-17 DIAGNOSIS — I080 Rheumatic disorders of both mitral and aortic valves: Secondary | ICD-10-CM | POA: Diagnosis not present

## 2023-08-17 DIAGNOSIS — D860 Sarcoidosis of lung: Secondary | ICD-10-CM | POA: Diagnosis not present

## 2023-08-17 DIAGNOSIS — Z8543 Personal history of malignant neoplasm of ovary: Secondary | ICD-10-CM | POA: Diagnosis not present

## 2023-08-17 DIAGNOSIS — Z87891 Personal history of nicotine dependence: Secondary | ICD-10-CM | POA: Diagnosis not present

## 2023-08-17 DIAGNOSIS — B0229 Other postherpetic nervous system involvement: Secondary | ICD-10-CM | POA: Insufficient documentation

## 2023-08-17 DIAGNOSIS — I371 Nonrheumatic pulmonary valve insufficiency: Secondary | ICD-10-CM | POA: Diagnosis not present

## 2023-08-17 DIAGNOSIS — M47816 Spondylosis without myelopathy or radiculopathy, lumbar region: Secondary | ICD-10-CM | POA: Diagnosis not present

## 2023-08-17 DIAGNOSIS — G8929 Other chronic pain: Secondary | ICD-10-CM | POA: Diagnosis not present

## 2023-08-17 DIAGNOSIS — I272 Pulmonary hypertension, unspecified: Secondary | ICD-10-CM | POA: Insufficient documentation

## 2023-08-17 DIAGNOSIS — I3139 Other pericardial effusion (noninflammatory): Secondary | ICD-10-CM | POA: Diagnosis not present

## 2023-08-17 DIAGNOSIS — I119 Hypertensive heart disease without heart failure: Secondary | ICD-10-CM | POA: Diagnosis not present

## 2023-08-17 DIAGNOSIS — M810 Age-related osteoporosis without current pathological fracture: Secondary | ICD-10-CM | POA: Insufficient documentation

## 2023-08-17 DIAGNOSIS — Z79899 Other long term (current) drug therapy: Secondary | ICD-10-CM | POA: Diagnosis not present

## 2023-08-17 LAB — ECHOCARDIOGRAM COMPLETE
AR max vel: 3.19 cm2
AV Area VTI: 3.87 cm2
AV Area mean vel: 3.81 cm2
AV Mean grad: 3 mmHg
AV Peak grad: 5.7 mmHg
Ao pk vel: 1.19 m/s
Area-P 1/2: 5.66 cm2
S' Lateral: 1.4 cm

## 2023-08-17 NOTE — Progress Notes (Signed)
*  PRELIMINARY RESULTS* Echocardiogram 2D Echocardiogram has been performed.  Autumn [name] 08/17/2023, 11:42 AM

## 2023-08-31 ENCOUNTER — Ambulatory Visit
Admission: RE | Admit: 2023-08-31 | Discharge: 2023-08-31 | Disposition: A | Discharge status: Home / Self Care | Source: Ambulatory Visit | Attending: Internal Medicine | Admitting: Internal Medicine

## 2023-08-31 DIAGNOSIS — Z1231 Encounter for screening mammogram for malignant neoplasm of breast: Secondary | ICD-10-CM | POA: Insufficient documentation

## 2023-10-04 ENCOUNTER — Encounter: Payer: Self-pay | Admitting: Nurse Practitioner

## 2023-10-04 ENCOUNTER — Ambulatory Visit: Attending: Student in an Organized Health Care Education/Training Program | Admitting: Nurse Practitioner

## 2023-10-04 VITALS — BP 155/61 | HR 74 | Temp 98.0°F | Ht 67.0 in | Wt 108.0 lb

## 2023-10-04 DIAGNOSIS — M25552 Pain in left hip: Secondary | ICD-10-CM | POA: Diagnosis present

## 2023-10-04 DIAGNOSIS — M1612 Unilateral primary osteoarthritis, left hip: Secondary | ICD-10-CM | POA: Insufficient documentation

## 2023-10-04 DIAGNOSIS — M47894 Other spondylosis, thoracic region: Secondary | ICD-10-CM | POA: Insufficient documentation

## 2023-10-04 DIAGNOSIS — B0229 Other postherpetic nervous system involvement: Secondary | ICD-10-CM | POA: Diagnosis present

## 2023-10-04 DIAGNOSIS — Z79891 Long term (current) use of opiate analgesic: Secondary | ICD-10-CM | POA: Insufficient documentation

## 2023-10-04 DIAGNOSIS — G894 Chronic pain syndrome: Secondary | ICD-10-CM | POA: Insufficient documentation

## 2023-10-04 DIAGNOSIS — M47816 Spondylosis without myelopathy or radiculopathy, lumbar region: Secondary | ICD-10-CM | POA: Diagnosis present

## 2023-10-04 NOTE — Progress Notes (Signed)
 PROVIDER NOTE: Interpretation of information contained herein should be left to medically-trained personnel. Specific patient instructions are provided elsewhere under Patient Instructions section of medical record. This document was created in part using AI and STT-dictation technology, any transcriptional errors that may result from this process are unintentional.  Patient: Autumn [name]  Service: E/M   PCP: Sherial Bail, MD  DOB: 1945/09/02  DOS: 10/04/2023  Provider: Emmy MARLA Blanch, NP  MRN: [id number]  Delivery: Face-to-face  Specialty: Interventional Pain Management  Type: Established Patient  Setting: Ambulatory outpatient facility  Specialty designation: 09  Referring Prov.: Sherial Bail, MD  Location: Outpatient office facility       History of present illness (HPI) Autumn [name], a 78 y.o. year old female, is here today because of her Primary osteoarthritis of left hip [M16.12]. Autumn [name] primary complain today is Leg Pain (left)  Pertinent problems: Autumn [name] has COPD (chronic obstructive pulmonary disease) (HCC); Post herpetic Neuralgia, Pulmonary sarcoidosis (HCC); Chronic pain syndrome; Lumbar facet arthropathy, and Thoracic radiculopathy on their pertinent problem list.   Pain Assessment: Severity of Chronic pain is reported as a 8 /10. Location: Leg Left/denies. Onset: More than a month ago. Quality: Sharp. Timing: Constant. Modifying factor(s): put leg up, Tylenol ; laying down. Vitals:  height is 5' 7 (1.702 m) and weight is 108 lb (49 kg). Her temperature is 98 F (36.7 C). Her blood pressure is 155/61 (abnormal) and her pulse is 74. Her oxygen saturation is 100%.  BMI: Estimated body mass index is 16.92 kg/m as calculated from the following:   Height as of this encounter: 5' 7 (1.702 m).   Weight as of this encounter: 108 lb (49 kg).  Last encounter: 08/16/2023 Last procedure: 08/10/2023  Reason for encounter: evaluation of worsening, or previously  known (established) problem.  The patient present with the complaints of low back pain radiating to the lateral aspect of the left leg, to the level of the knee.   Of note, she received left intra-articular hip steroid injection on July 26, 2022 with excellent response from the procedure.  Given flareup on lower back radiating to the lateral aspect of the left hip and leg, we discussed repeating intra-articular hip injection on the left side of the hip.   Pharmacotherapy Assessment   Analgesic:  Monitoring: [place] PMP: PDMP reviewed during this encounter.       Pharmacotherapy: No side-effects or adverse reactions reported. Compliance: No problems identified. Effectiveness: Clinically acceptable.  Margrette Nathanel PARAS, RN  10/04/2023  2:36 PM  Sign when Signing Visit Safety precautions to be maintained throughout the outpatient stay will include: orient to surroundings, keep bed in low position, maintain call bell within reach at all times, provide assistance with transfer out of bed and ambulation.     UDS:  Summary  Date Value Ref Range Status  06/22/2022 Note  Final    Comment:    ==================================================================== [facility] (MW) ==================================================================== Test                             Result       Flag       Units  Drug Present and Declared for Prescription Verification   Hydrocodone                     3026         EXPECTED   ng/mg creat   Hydromorphone  148          EXPECTED   ng/mg creat   Dihydrocodeine                 357          EXPECTED   ng/mg creat   Norhydrocodone                 >3571        EXPECTED   ng/mg creat    Sources of hydrocodone  include scheduled prescription medications.    Hydromorphone, dihydrocodeine and norhydrocodone are expected    metabolites of hydrocodone . Hydromorphone and dihydrocodeine are    also available as scheduled prescription  medications.  ==================================================================== Test                      Result    Flag   Units      Ref Range   Creatinine              140              mg/dL      >=79 ==================================================================== Declared Medications:  The flagging and interpretation on this report are based on the  following declared medications.  Unexpected results may arise from  inaccuracies in the declared medications.   **Note: The testing scope of this panel includes these medications:   Hydrocodone  (Norco)   **Note: The testing scope of this panel does not include the  following reported medications:   Acetaminophen  (Tylenol )  Acetaminophen  (Norco)  Albuterol  (Ventolin  HFA)  Albuterol  (Duoneb)  Amlodipine (Norvasc)  Atenolol  (Tenormin )  Atorvastatin  (Lipitor)  Cetirizine (Zyrtec)  Fluticasone  (Flonase)  Fluticasone  (Trelegy)  Ipratropium (Duoneb)  Losartan (Cozaar)  Methylprednisolone  (Medrol )  Mirtazapine  (Remeron )  Ondansetron  (Zofran )  Polyethylene Glycol  Prednisone  (Deltasone )  Romosozumab (Evenity)  Sertraline  (Zoloft )  Umeclidinium (Trelegy)  Vilanterol (Trelegy) ==================================================================== For clinical consultation, please call [phone]. ====================================================================     No results found for: CBDTHCR No results found for: D8THCCBX No results found for: D9THCCBX  ROS  Constitutional: Denies any fever or chills Gastrointestinal: No reported hemesis, hematochezia, vomiting, or acute GI distress Musculoskeletal: Lower back pain radiated to left hip to the lateral aspect of the left leg up to the level of the knee Neurological: No reported episodes of acute onset apraxia, aphasia, dysarthria, agnosia, amnesia, paralysis, loss of coordination, or loss of consciousness  Medication Review  Fluticasone -Umeclidin-Vilant,  HYDROcodone -acetaminophen , Propylene Glycol, acetaminophen , albuterol , amLODipine, atenolol , atorvastatin , cetirizine, denosumab, ipratropium-albuterol , losartan, mirtazapine , ondansetron , polyethylene glycol powder, predniSONE , and sertraline   History Review  Allergy: Autumn [name] is allergic to ivp dye [iodinated contrast media]. Drug: Autumn [name]  reports no history of drug use. Alcohol:  reports no history of alcohol use. Tobacco:  reports that she quit smoking about 37 years ago. Her smoking use included cigarettes. She started smoking about 57 years ago. She has never used smokeless tobacco. Social: Autumn [name]  reports that she quit smoking about 37 years ago. Her smoking use included cigarettes. She started smoking about 57 years ago. She has never used smokeless tobacco. She reports that she does not drink alcohol and does not use drugs. Medical:  has a past medical history of Allergy, Anemia, Arthritis, COPD (chronic obstructive pulmonary disease) (HCC), Glaucoma, H/O uvulectomy, Hypertension, Osteoporosis, Ovarian cancer (HCC), Postherpetic neuralgia, Pulmonary sarcoidosis (HCC), and Sarcoid. Surgical: Autumn [name]  has a past surgical history that includes Abdominal hysterectomy; Dilation and curettage of uterus; Vulvar lesion removal;  Diagnostic laparoscopy; Eye surgery; Colonoscopy with propofol  (N/A, 07/30/2019); and Breast excisional biopsy (Right). Family: family history includes Breast cancer (age of onset: 50) in her mother; Cancer in her mother; Hypertension in her father.  Laboratory Chemistry Profile   Renal Lab Results  Component Value Date   BUN 29 (H) 06/26/2021   CREATININE 0.79 06/26/2021   GFRAA >60 02/13/2018   GFRNONAA >60 06/26/2021    Hepatic Lab Results  Component Value Date   AST 25 06/26/2021   ALT 23 06/26/2021   ALBUMIN 4.6 06/26/2021   ALKPHOS 42 06/26/2021   LIPASE 32 06/26/2021    Electrolytes Lab Results  Component Value Date   NA 138 06/26/2021   K  3.7 06/26/2021   CL 94 (L) 06/26/2021   CALCIUM  9.5 06/26/2021   MG 2.7 (H) 02/07/2018   PHOS 3.2 02/07/2018    Bone No results found for: VD25OH, [id number], CI6874NY7, CI7874NY7, 25OHVITD1, 25OHVITD2, 25OHVITD3, TESTOFREE, TESTOSTERONE  Inflammation (CRP: Acute Phase) (ESR: Chronic Phase) Lab Results  Component Value Date   ESRSEDRATE 48 (H) 05/25/2011         Note: Above Lab results reviewed.  Recent Imaging Review  MM 3D SCREENING MAMMOGRAM BILATERAL BREAST CLINICAL DATA:  Screening.  EXAM: DIGITAL SCREENING BILATERAL MAMMOGRAM WITH TOMOSYNTHESIS AND CAD  TECHNIQUE: Bilateral screening digital craniocaudal and mediolateral oblique mammograms were obtained. Bilateral screening digital breast tomosynthesis was performed. The images were evaluated with computer-aided detection.  COMPARISON:  Previous exam(s).  ACR Breast Density Category c: The breasts are heterogeneously dense, which may obscure small masses.  FINDINGS: There are no findings suspicious for malignancy.  IMPRESSION: No mammographic evidence of malignancy. A result letter of this screening mammogram will be mailed directly to the patient.  RECOMMENDATION: Screening mammogram in one year. (Code:SM-B-01Y)  BI-RADS CATEGORY  1: Negative.  Electronically Signed   By: Toribio Agreste M.D.   On: 09/07/2023 09:02 Note: Reviewed        Physical Exam  General appearance: Well nourished, well developed, and well hydrated. In no apparent acute distress Mental status: Alert, oriented x 3 (person, place, & time)       Respiratory: No evidence of acute respiratory distress Eyes: PERLA Vitals: BP (!) 155/61   Pulse 74   Temp 98 F (36.7 C)   Ht 5' 7 (1.702 m)   Wt 108 lb (49 kg)   SpO2 100% Comment: 2L/Mayer  BMI 16.92 kg/m  BMI: Estimated body mass index is 16.92 kg/m as calculated from the following:   Height as of this encounter: 5' 7 (1.702 m).   Weight as of this encounter: 108  lb (49 kg). Ideal: Ideal body weight: 61.6 kg (135 lb 12.9 oz)  Lumbar Spine Area Exam  Skin & Axial Inspection: No masses, redness, or swelling Alignment: Symmetrical Functional ROM: Pain restricted ROM       Stability: No instability detected Muscle Tone/Strength: Functionally intact. No obvious neuro-muscular anomalies detected. Sensory (Neurological): Musculoskeletal pain pattern Palpation: No palpable anomalies       Provocative Tests: Hyperextension/rotation test: (+) bilaterally for facet joint pain. Left hip pain, worse with weightbearing Gait & Posture Assessment  Ambulation: Limited Gait: Antalgic Posture: WNL  Lower Extremity Exam      Side: Right lower extremity   Side: Left lower extremity  Stability: No instability observed           Stability: No instability observed          Skin & Extremity Inspection: Skin  color, temperature, and hair growth are WNL. No peripheral edema or cyanosis. No masses, redness, swelling, asymmetry, or associated skin lesions. No contractures.   Skin & Extremity Inspection: Skin color, temperature, and hair growth are WNL. No peripheral edema or cyanosis. No masses, redness, swelling, asymmetry, or associated skin lesions. No contractures.  Functional ROM: Unrestricted ROM                   Functional ROM: Pain restricted ROM for hip joint          Muscle Tone/Strength: Functionally intact. No obvious neuro-muscular anomalies detected.   Muscle Tone/Strength: Functionally intact. No obvious neuro-muscular anomalies detected.  Sensory (Neurological): Neurogenic pain pattern         Sensory (Neurological): Musculoskeletal pain pattern        DTR: Patellar: deferred today Achilles: deferred today Plantar: deferred today   DTR: Patellar: deferred today Achilles: deferred today Plantar: deferred today  Palpation: No palpable anomalies   Palpation: No palpable anomalies    Assessment   Diagnosis Status  1. Primary osteoarthritis of left hip    2. Left hip pain   3. Lumbar facet arthropathy   4. Chronic pain syndrome   5. Chronic use of opiate for therapeutic purpose   6. Thoracic facet joint syndrome   7. Spondylosis without myelopathy or radiculopathy, lumbar region   8. Post herpetic neuralgia    Persistent Persistent Persistent   Updated Problems: No problems updated.  Plan of Care  Problem-specific:  Assessment and Plan Autumn [name] has a current medication list which includes: Amlodipine, atenolol , atorvastatin , ipratropium-albuterol , sertraline .   Plan: Hip injection : Future   Autumn [name] has a current medication list which includes the following long-term medication(s): albuterol , albuterol , amlodipine, atenolol , atorvastatin , ipratropium-albuterol , losartan, and sertraline .  Pharmacotherapy (Medications Ordered): No orders of the defined types were placed in this encounter.  Orders:  Orders Placed This Encounter  Procedures   HIP INJECTION    Standing Status:   Future    Expiration Date:   01/04/2024    Scheduling Instructions:     Procedure: Intra-articular Hip joint injection     Side: Left-sided     Approach: Lateral     Sedation: Patient's choice.     Timeframe: As soon as schedule allows        No follow-ups on file.    Recent Visits Date Type Provider Dept  08/16/23 Office Visit Marcelino Nurse, MD [facility]  08/10/23 Procedure visit Marcelino Nurse, MD [facility]  Showing recent visits within past 90 days and meeting all other requirements Today's Visits Date Type Provider Dept  10/04/23 Office Visit Oluwadamilola Rosamond K, NP [facility]  Showing today's visits and meeting all other requirements Future Appointments Date Type Provider Dept  10/26/23 Appointment Marcelino Nurse, MD [facility]  Showing future appointments within next 90 days and meeting all other requirements  I discussed the assessment and treatment plan with the patient.  The patient was provided an opportunity to ask questions and all were answered. The patient agreed with the plan and demonstrated an understanding of the instructions.  Patient advised to call back or seek an in-person evaluation if the symptoms or condition worsens.  Duration of encounter: 25 minutes.  Total time on encounter, as per AMA guidelines included both the face-to-face and non-face-to-face time personally spent by the physician and/or other qualified health care professional(s) on the day of the encounter (includes  time in activities that require the physician or other qualified health care professional and does not include time in activities normally performed by clinical staff). Physician's time may include the following activities when performed: Preparing to see the patient (e.g., pre-charting review of records, searching for previously ordered imaging, lab work, and nerve conduction tests) Review of prior analgesic pharmacotherapies. Reviewing PMP Interpreting ordered tests (e.g., lab work, imaging, nerve conduction tests) Performing post-procedure evaluations, including interpretation of diagnostic procedures Obtaining and/or reviewing separately obtained history Performing a medically appropriate examination and/or evaluation Counseling and educating the patient/family/caregiver Ordering medications, tests, or procedures Referring and communicating with other health care professionals (when not separately reported) Documenting clinical information in the electronic or other health record Independently interpreting results (not separately reported) and communicating results to the patient/ family/caregiver Care coordination (not separately reported)  Note by: Zoraya Fiorenza K Hieu Herms, NP (TTS and AI technology used. I apologize for any typographical errors that were not detected and corrected.) Date: 10/04/2023; Time: 3:41 PM

## 2023-10-04 NOTE — Patient Instructions (Signed)

## 2023-10-04 NOTE — Progress Notes (Signed)
 Safety precautions to be maintained throughout the outpatient stay will include: orient to surroundings, keep bed in low position, maintain call bell within reach at all times, provide assistance with transfer out of bed and ambulation.

## 2023-10-10 ENCOUNTER — Ambulatory Visit
Admission: RE | Admit: 2023-10-10 | Discharge: 2023-10-10 | Disposition: A | Discharge status: Home / Self Care | Source: Ambulatory Visit | Attending: Student in an Organized Health Care Education/Training Program | Admitting: Student in an Organized Health Care Education/Training Program

## 2023-10-10 ENCOUNTER — Encounter: Payer: Self-pay | Admitting: Student in an Organized Health Care Education/Training Program

## 2023-10-10 ENCOUNTER — Ambulatory Visit (HOSPITAL_BASED_OUTPATIENT_CLINIC_OR_DEPARTMENT_OTHER): Admitting: Student in an Organized Health Care Education/Training Program

## 2023-10-10 DIAGNOSIS — M1612 Unilateral primary osteoarthritis, left hip: Secondary | ICD-10-CM

## 2023-10-10 DIAGNOSIS — M25552 Pain in left hip: Secondary | ICD-10-CM | POA: Insufficient documentation

## 2023-10-10 MED ORDER — LIDOCAINE HCL 2 % IJ SOLN
INTRAMUSCULAR | Status: AC
Start: 2023-10-10 — End: 2023-10-10
  Filled 2023-10-10: qty 20

## 2023-10-10 MED ORDER — ROPIVACAINE HCL 2 MG/ML IJ SOLN: 4.0000 mL | Freq: Once | INTRAMUSCULAR | Status: DC

## 2023-10-10 MED ORDER — METHYLPREDNISOLONE ACETATE 40 MG/ML IJ SUSP: 40.0000 mg | Freq: Once | INTRAMUSCULAR | Status: DC

## 2023-10-10 MED ORDER — METHYLPREDNISOLONE ACETATE 40 MG/ML IJ SUSP
INTRAMUSCULAR | Status: AC
  Filled 2023-10-10: qty 1

## 2023-10-10 MED ORDER — IOHEXOL 180 MG/ML  SOLN: 10.0000 mL | Freq: Once | INTRAMUSCULAR | Status: DC

## 2023-10-10 MED ORDER — LIDOCAINE HCL 2 % IJ SOLN: 20.0000 mL | Freq: Once | INTRAMUSCULAR | Status: DC

## 2023-10-10 MED ORDER — IOHEXOL 180 MG/ML  SOLN
INTRAMUSCULAR | Status: AC
  Filled 2023-10-10: qty 20

## 2023-10-10 MED ORDER — ROPIVACAINE HCL 2 MG/ML IJ SOLN
INTRAMUSCULAR | Status: AC
  Filled 2023-10-10: qty 20

## 2023-10-10 NOTE — Patient Instructions (Signed)

## 2023-10-10 NOTE — Progress Notes (Signed)
 PROVIDER NOTE: Interpretation of information contained herein should be left to medically-trained personnel. Specific patient instructions are provided elsewhere under Patient Instructions section of medical record. This document was created in part using STT-dictation technology, any transcriptional errors that may result from this process are unintentional.  Patient: Autumn [name] Type: Established DOB: 1945-07-09 MRN: [id number] PCP: Sherial Bail, MD  Service: Procedure DOS: 10/10/2023 Setting: Ambulatory Location: Ambulatory outpatient facility Delivery: Face-to-face Provider: Wallie Sherry, MD Specialty: Interventional Pain Management Specialty designation: 09 Location: Outpatient facility Ref. Prov.: Patel, Seema K, NP       Interventional Therapy   Procedure: Intra-articular hip injection  #1  Laterality: Left (-LT)  Approach: Percutaneous anterior approach. Level: Lower pelvic and hip joint level.  Imaging: Fluoroscopy-guided         Anesthesia: Local anesthesia (1-2% Lidocaine ) DOS: 10/10/2023  Performed by: Wallie Sherry, MD  Purpose: Diagnostic/Therapeutic Indications: Hip pain severe enough to impact quality of life or function. Rationale (medical necessity): procedure needed and proper for the diagnosis and/or treatment of [name]'s medical symptoms and needs. 1. Left hip pain   2. Primary osteoarthritis of left hip    NAS-11 Pain score:   Pre-procedure: 7 /10   Post-procedure: 7 /10      Target: Intra-articular hip joint Region: Hip joint, upper (proximal) femoral region Type of procedure: Percutaneous joint injection   Position / Prep / Materials:  Position: Supine  Prep solution: ChloraPrep (2% chlorhexidine  gluconate and 70% isopropyl alcohol) Prep Area:  Entire Posterolateral hip area. Materials:  Tray: Block tray Needle(s):  Type: Spinal  Gauge (G): 22  Length: 5-in  Qty: 1  H&P (Pre-op Assessment):  Autumn [name] is a 78 y.o. (year old),  female patient, seen today for interventional treatment. She  has a past surgical history that includes Abdominal hysterectomy; Dilation and curettage of uterus; Vulvar lesion removal; Diagnostic laparoscopy; Eye surgery; Colonoscopy with propofol  (N/A, 07/30/2019); and Breast excisional biopsy (Right). Autumn [name] has a current medication list which includes the following prescription(s): acetaminophen , albuterol , albuterol , amlodipine, atenolol , atorvastatin , cetirizine, denosumab, hydrocodone -acetaminophen , ipratropium-albuterol , losartan, mirtazapine , ondansetron , polyethylene glycol powder, prednisone , propylene glycol, sertraline , and trelegy ellipta , and the following Facility-Administered Medications: iohexol , lidocaine , methylprednisolone  acetate, and ropivacaine  (pf) 2 mg/ml (0.2%). Her primarily concern today is the Hip Pain (left)  Initial Vital Signs:  Pulse/HCG Rate: 73  Temp: (!) 97.5 F (36.4 C) Resp: 16 BP: (!) 190/73 SpO2: 100 % (O2 at 2 liters, BNC)  BMI: Estimated body mass index is 17.11 kg/m as calculated from the following:   Height as of this encounter: 5' 6 (1.676 m).   Weight as of this encounter: 106 lb (48.1 kg).  Risk Assessment: Allergies: Reviewed. She is allergic to ivp dye [iodinated contrast media].  Allergy Precautions: None required Coagulopathies: Reviewed. None identified.  Blood-thinner therapy: None at this time Active Infection(s): Reviewed. None identified. Autumn [name] is afebrile  Site Confirmation: Autumn [name] was asked to confirm the procedure and laterality before marking the site Procedure checklist: Completed Consent: Before the procedure and under the influence of no sedative(s), amnesic(s), or anxiolytics, the patient was informed of the treatment options, risks and possible complications. To fulfill our ethical and legal obligations, as recommended by the American Medical Association's Code of Ethics, I have informed the patient of my clinical  impression; the nature and purpose of the treatment or procedure; the risks, benefits, and possible complications of the intervention; the alternatives, including doing nothing; the risk(s) and benefit(s) of the alternative  treatment(s) or procedure(s); and the risk(s) and benefit(s) of doing nothing. The patient was provided information about the general risks and possible complications associated with the procedure. These may include, but are not limited to: failure to achieve desired goals, infection, bleeding, organ or nerve damage, allergic reactions, paralysis, and death. In addition, the patient was informed of those risks and complications associated to the procedure, such as failure to decrease pain; infection; bleeding; organ or nerve damage with subsequent damage to sensory, motor, and/or autonomic systems, resulting in permanent pain, numbness, and/or weakness of one or several areas of the body; allergic reactions; (i.e.: anaphylactic reaction); and/or death. Furthermore, the patient was informed of those risks and complications associated with the medications. These include, but are not limited to: allergic reactions (i.e.: anaphylactic or anaphylactoid reaction(s)); adrenal axis suppression; blood sugar elevation that in diabetics may result in ketoacidosis or comma; water retention that in patients with history of congestive heart failure may result in shortness of breath, pulmonary edema, and decompensation with resultant heart failure; weight gain; swelling or edema; medication-induced neural toxicity; particulate matter embolism and blood vessel occlusion with resultant organ, and/or nervous system infarction; and/or aseptic necrosis of one or more joints. Finally, the patient was informed that Medicine is not an exact science; therefore, there is also the possibility of unforeseen or unpredictable risks and/or possible complications that may result in a catastrophic outcome. The patient  indicated having understood very clearly. We have given the patient no guarantees and we have made no promises. Enough time was given to the patient to ask questions, all of which were answered to the patient's satisfaction. Ms. Enck has indicated that she wanted to continue with the procedure. Attestation: I, the ordering provider, attest that I have discussed with the patient the benefits, risks, side-effects, alternatives, likelihood of achieving goals, and potential problems during recovery for the procedure that I have provided informed consent. Date  Time: 10/10/2023 11:41 AM  Pre-Procedure Preparation:  Monitoring: As per clinic protocol. Respiration, ETCO2, SpO2, BP, heart rate and rhythm monitor placed and checked for adequate function Safety Precautions: Patient was assessed for positional comfort and pressure points before starting the procedure. Time-out: I initiated and conducted the Time-out before starting the procedure, as per protocol. The patient was asked to participate by confirming the accuracy of the Time Out information. Verification of the correct person, site, and procedure were performed and confirmed by me, the nursing staff, and the patient. Time-out conducted as per Joint Commission's Universal Protocol (UP.01.01.01). Time: 1230 Start Time: 1230 hrs.  Description/Narrative of Procedure:          Rationale (medical necessity): procedure needed and proper for the diagnosis and/or treatment of the patient's medical symptoms and needs. Procedural Technique Safety Precautions: Aspiration looking for blood return was conducted prior to all injections. At no point did we inject any substances, as a needle was being advanced. No attempts were made at seeking any paresthesias. Safe injection practices and needle disposal techniques used. Medications properly checked for expiration dates. SDV (single dose vial) medications used. Description of the Procedure: Protocol  guidelines were followed. The patient was assisted into a comfortable position. The target area was identified and the area prepped in the usual manner. Skin & deeper tissues infiltrated with local anesthetic. Appropriate amount of time allowed to pass for local anesthetics to take effect. The procedure needles were then advanced to the target area. Proper needle placement secured. Negative aspiration confirmed. Solution injected in intermittent fashion, asking  for systemic symptoms every 0.5cc of injectate. The needles were then removed and the area cleansed, making sure to leave some of the prepping solution back to take advantage of its long term bactericidal properties.  Technical description of procedure:  Skin & deeper tissues infiltrated with local anesthetic. Appropriate amount of time allowed to pass for local anesthetics to take effect. The procedure needles were then advanced to the target area. Proper needle placement secured. Negative aspiration confirmed. Solution injected in intermittent fashion, asking for systemic symptoms every 0.5cc of injectate. The needles were then removed and the area cleansed, making sure to leave some of the prepping solution back to take advantage of its long term bactericidal properties.           Vitals:   10/10/23 1201 10/10/23 1230 10/10/23 1239  BP: (!) 190/73 (!) 210/93 (!) 199/94  Pulse: 73 90 88  Resp: 16 17 16   Temp: (!) 97.5 F (36.4 C)    TempSrc: Temporal    SpO2: 100% 100% 100%  Weight: 106 lb (48.1 kg)    Height: 5' 6 (1.676 m)       Start Time: 1230 hrs. End Time: 1233 hrs.  Imaging Guidance (Non-Spinal):          Type of Imaging Technique: Fluoroscopy Guidance (Non-Spinal) Indication(s): Fluoroscopy guidance for needle placement to enhance accuracy in procedures requiring precise needle localization for targeted delivery of medication in or near specific anatomical locations not easily accessible without such real-time imaging  assistance. Exposure Time: Please see nurses notes. Contrast: Before injecting any contrast, we confirmed that the patient did not have an allergy to iodine, shellfish, or radiological contrast. Once satisfactory needle placement was completed at the desired level, radiological contrast was injected. Contrast injected under live fluoroscopy. No contrast complications. See chart for type and volume of contrast used. Fluoroscopic Guidance: I was personally present during the use of fluoroscopy. Tunnel Vision Technique used to obtain the best possible view of the target area. Parallax error corrected before commencing the procedure. Direction-depth-direction technique used to introduce the needle under continuous pulsed fluoroscopy. Once target was reached, antero-posterior, oblique, and lateral fluoroscopic projection used confirm needle placement in all planes. Images permanently stored in EMR. Interpretation: I personally interpreted the imaging intraoperatively. Adequate needle placement confirmed in multiple planes. Appropriate spread of contrast into desired area was observed. No evidence of afferent or efferent intravascular uptake. Permanent images saved into the patient's record.  Post-operative Assessment:  Post-procedure Vital Signs:  Pulse/HCG Rate: 88  Temp: (!) 97.5 F (36.4 C) Resp: 16 BP: (!) 199/94 SpO2: 100 %  EBL: None  Complications: No immediate post-treatment complications observed by team, or reported by patient.  Note: The patient tolerated the entire procedure well. A repeat set of vitals were taken after the procedure and the patient was kept under observation following institutional policy, for this type of procedure. Post-procedural neurological assessment was performed, showing return to baseline, prior to discharge. The patient was provided with post-procedure discharge instructions, including a section on how to identify potential problems. Should any problems arise  concerning this procedure, the patient was given instructions to immediately contact us , at any time, without hesitation. In any case, we plan to contact the patient by telephone for a follow-up status report regarding this interventional procedure.  Comments:  No additional relevant information.  Plan of Care (POC)  Orders:  Orders Placed This Encounter  Procedures   DG PAIN CLINIC C-ARM 1-60 MIN NO REPORT  Intraoperative interpretation by procedural physician at [facility].    Standing Status:   Standing    Number of Occurrences:   1    Reason for exam::   Assistance in needle guidance and placement for procedures requiring needle placement in or near specific anatomical locations not easily accessible without such assistance.     Medications ordered for procedure: Meds ordered this encounter  Medications   iohexol  (OMNIPAQUE ) 180 MG/ML injection 10 mL    Must be Myelogram-compatible. If not available, you may substitute with a water-soluble, non-ionic, hypoallergenic, myelogram-compatible radiological contrast medium.   lidocaine  (XYLOCAINE ) 2 % (with pres) injection 400 mg   ropivacaine  (PF) 2 mg/mL (0.2%) (NAROPIN ) injection 4 mL   methylPREDNISolone  acetate (DEPO-MEDROL ) injection 40 mg   Medications administered: Jannely I. Siegrist had no medications administered during this visit.  See the medical record for exact dosing, route, and time of administration.    Follow-up plan:   Return in about 4 weeks (around 11/07/2023) for PPE in person.     Recent Visits Date Type Provider Dept  10/04/23 Office Visit Patel, Seema K, NP [facility]  08/16/23 Office Visit Marcelino Nurse, MD [facility]  08/10/23 Procedure visit Marcelino Nurse, MD [facility]  Showing recent visits within past 90 days and meeting all other requirements Today's Visits Date Type Provider Dept  10/10/23 Procedure visit Marcelino Nurse, MD [facility]   Showing today's visits and meeting all other requirements Future Appointments Date Type Provider Dept  11/08/23 Appointment Marcelino Nurse, MD [facility]  Showing future appointments within next 90 days and meeting all other requirements   Disposition: Discharge home  Discharge (Date  Time): 10/10/2023; 1242 hrs.   Primary Care Physician: Sherial Bail, MD Location: [facility] Note by: Nurse Marcelino, MD (TTS technology used. I apologize for any typographical errors that were not detected and corrected.) Date: 10/10/2023; Time: 12:51 PM  Disclaimer:  Medicine is not an Visual merchandiser. The only guarantee in medicine is that nothing is guaranteed. It is important to note that the decision to proceed with this intervention was based on the information collected from the patient. The Data and conclusions were drawn from the patient's questionnaire, the interview, and the physical examination. Because the information was provided in large part by the patient, it cannot be guaranteed that it has not been purposely or unconsciously manipulated. Every effort has been made to obtain as much relevant data as possible for this evaluation. It is important to note that the conclusions that lead to this procedure are derived in large part from the available data. Always take into account that the treatment will also be dependent on availability of resources and existing treatment guidelines, considered by other Pain Management Practitioners as being common knowledge and practice, at the time of the intervention. For Medico-Legal purposes, it is also important to point out that variation in procedural techniques and pharmacological choices are the acceptable norm. The indications, contraindications, technique, and results of the above procedure should only be interpreted and judged by a Board-Certified Interventional Pain Specialist with extensive familiarity and expertise  in the same exact procedure and technique.

## 2023-10-11 ENCOUNTER — Telehealth: Payer: Self-pay | Admitting: *Deleted

## 2023-10-11 NOTE — Telephone Encounter (Signed)
 Attempted to call for post procedure follow-up. Message left.

## 2023-10-13 ENCOUNTER — Ambulatory Visit: Admitting: Pulmonary Disease

## 2023-10-13 ENCOUNTER — Telehealth: Payer: Self-pay | Admitting: Student in an Organized Health Care Education/Training Program

## 2023-10-13 ENCOUNTER — Encounter

## 2023-10-13 NOTE — Telephone Encounter (Signed)
Spoke with patient and answered her questions. Patient verbalized understanding.

## 2023-10-13 NOTE — Telephone Encounter (Signed)
 PT called stated that she started to have pain on Wed, and is still having pain. PT stated she didn't having any pain on Monday or Tues. Please give patient a call. TY

## 2023-10-17 ENCOUNTER — Other Ambulatory Visit: Payer: Self-pay

## 2023-10-17 ENCOUNTER — Emergency Department

## 2023-10-17 ENCOUNTER — Emergency Department
Admission: EM | Admit: 2023-10-17 | Discharge: 2023-10-18 | Disposition: A | Discharge status: Other Acute Inpatient Hospital | Source: Ambulatory Visit | Attending: Emergency Medicine | Admitting: Emergency Medicine

## 2023-10-17 ENCOUNTER — Encounter (HOSPITAL_COMMUNITY): Payer: Self-pay

## 2023-10-17 DIAGNOSIS — Y92009 Unspecified place in unspecified non-institutional (private) residence as the place of occurrence of the external cause: Secondary | ICD-10-CM | POA: Insufficient documentation

## 2023-10-17 DIAGNOSIS — N3001 Acute cystitis with hematuria: Secondary | ICD-10-CM | POA: Diagnosis not present

## 2023-10-17 DIAGNOSIS — D72829 Elevated white blood cell count, unspecified: Secondary | ICD-10-CM | POA: Diagnosis not present

## 2023-10-17 DIAGNOSIS — D649 Anemia, unspecified: Secondary | ICD-10-CM | POA: Diagnosis not present

## 2023-10-17 DIAGNOSIS — J449 Chronic obstructive pulmonary disease, unspecified: Secondary | ICD-10-CM | POA: Diagnosis not present

## 2023-10-17 DIAGNOSIS — S0012XA Contusion of left eyelid and periocular area, initial encounter: Secondary | ICD-10-CM | POA: Diagnosis not present

## 2023-10-17 DIAGNOSIS — A419 Sepsis, unspecified organism: Secondary | ICD-10-CM | POA: Insufficient documentation

## 2023-10-17 DIAGNOSIS — S0592XA Unspecified injury of left eye and orbit, initial encounter: Secondary | ICD-10-CM | POA: Diagnosis present

## 2023-10-17 DIAGNOSIS — W19XXXA Unspecified fall, initial encounter: Secondary | ICD-10-CM | POA: Insufficient documentation

## 2023-10-17 DIAGNOSIS — R748 Abnormal levels of other serum enzymes: Secondary | ICD-10-CM | POA: Diagnosis not present

## 2023-10-17 DIAGNOSIS — I10 Essential (primary) hypertension: Secondary | ICD-10-CM | POA: Diagnosis not present

## 2023-10-17 DIAGNOSIS — I615 Nontraumatic intracerebral hemorrhage, intraventricular: Secondary | ICD-10-CM | POA: Insufficient documentation

## 2023-10-17 DIAGNOSIS — R4182 Altered mental status, unspecified: Secondary | ICD-10-CM

## 2023-10-17 LAB — URINALYSIS, ROUTINE W REFLEX MICROSCOPIC
Bilirubin Urine: NEGATIVE
Glucose, UA: NEGATIVE mg/dL
Hgb urine dipstick: NEGATIVE
Ketones, ur: NEGATIVE mg/dL
Nitrite: NEGATIVE
Protein, ur: 100 mg/dL — AB
Specific Gravity, Urine: 1.019 (ref 1.005–1.030)
pH: 5 (ref 5.0–8.0)

## 2023-10-17 LAB — CBC
HCT: 21.7 % — ABNORMAL LOW (ref 36.0–46.0)
Hemoglobin: 6.4 g/dL — ABNORMAL LOW (ref 12.0–15.0)
MCH: 27.5 pg (ref 26.0–34.0)
MCHC: 29.5 g/dL — ABNORMAL LOW (ref 30.0–36.0)
MCV: 93.1 fL (ref 80.0–100.0)
Platelets: 422 K/uL — ABNORMAL HIGH (ref 150–400)
RBC: 2.33 MIL/uL — ABNORMAL LOW (ref 3.87–5.11)
RDW: 14.6 % (ref 11.5–15.5)
WBC: 15 K/uL — ABNORMAL HIGH (ref 4.0–10.5)
nRBC: 0 % (ref 0.0–0.2)

## 2023-10-17 LAB — COMPREHENSIVE METABOLIC PANEL WITH GFR
ALT: 23 U/L (ref 0–44)
AST: 58 U/L — ABNORMAL HIGH (ref 15–41)
Albumin: 3.8 g/dL (ref 3.5–5.0)
Alkaline Phosphatase: 31 U/L — ABNORMAL LOW (ref 38–126)
Anion gap: 10 (ref 5–15)
BUN: 43 mg/dL — ABNORMAL HIGH (ref 8–23)
CO2: 31 mmol/L (ref 22–32)
Calcium: 9.2 mg/dL (ref 8.9–10.3)
Chloride: 100 mmol/L (ref 98–111)
Creatinine, Ser: 0.87 mg/dL (ref 0.44–1.00)
GFR, Estimated: >60 mL/min (ref >60)
Glucose, Bld: 111 mg/dL — ABNORMAL HIGH (ref 70–99)
Potassium: 3.4 mmol/L — ABNORMAL LOW (ref 3.5–5.1)
Sodium: 141 mmol/L (ref 135–145)
Total Bilirubin: 0.9 mg/dL (ref 0.0–1.2)
Total Protein: 7.3 g/dL (ref 6.5–8.1)

## 2023-10-17 LAB — ABO/RH: ABO/RH(D): O POS

## 2023-10-17 LAB — CK: Total CK: 1826 U/L — ABNORMAL HIGH (ref 38–234)

## 2023-10-17 LAB — PREPARE RBC (CROSSMATCH)

## 2023-10-17 MED ORDER — SODIUM CHLORIDE 0.9 % IV BOLUS
1000.0000 mL | Freq: Once | INTRAVENOUS | Status: AC
  Administered 2023-10-17: 1000 mL via INTRAVENOUS

## 2023-10-17 MED ORDER — SODIUM CHLORIDE 0.9 % IV SOLN
2.0000 g | Freq: Once | INTRAVENOUS | Status: AC
  Administered 2023-10-17: 2 g via INTRAVENOUS
  Filled 2023-10-17: qty 20

## 2023-10-17 MED ORDER — SODIUM CHLORIDE 0.9 % IV SOLN: 10.0000 mL/h | Freq: Once | INTRAVENOUS | Status: DC

## 2023-10-17 MED ORDER — HYDROXYZINE HCL 25 MG PO TABS: 25.0000 mg | ORAL_TABLET | Freq: Once | ORAL | Status: DC

## 2023-10-17 MED ORDER — ACETAMINOPHEN 500 MG PO TABS
1000.0000 mg | ORAL_TABLET | Freq: Once | ORAL | Status: AC
  Administered 2023-10-17: 1000 mg via ORAL
  Filled 2023-10-17: qty 2

## 2023-10-17 MED ORDER — PROPARACAINE HCL 0.5 % OP SOLN
1.0000 [drp] | Freq: Once | OPHTHALMIC | Status: AC
  Administered 2023-10-17: 1 [drp] via OPHTHALMIC
  Filled 2023-10-17: qty 15

## 2023-10-17 MED ORDER — FLUORESCEIN SODIUM 1 MG OP STRP
1.0000 | ORAL_STRIP | Freq: Once | OPHTHALMIC | Status: AC
  Administered 2023-10-17: 1 via OPHTHALMIC
  Filled 2023-10-17: qty 1

## 2023-10-17 MED ORDER — METHYLPREDNISOLONE SODIUM SUCC 40 MG IJ SOLR
40.0000 mg | Freq: Once | INTRAMUSCULAR | Status: AC
  Administered 2023-10-17: 40 mg via INTRAVENOUS
  Filled 2023-10-17: qty 1

## 2023-10-17 MED ORDER — MIDAZOLAM HCL 2 MG/2ML IJ SOLN
1.0000 mg | Freq: Once | INTRAMUSCULAR | Status: AC
  Administered 2023-10-17: 1 mg via INTRAVENOUS
  Filled 2023-10-17: qty 2

## 2023-10-17 MED ORDER — DIPHENHYDRAMINE HCL 25 MG PO CAPS: 50.0000 mg | ORAL_CAPSULE | Freq: Once | ORAL | Status: AC

## 2023-10-17 MED ORDER — DIPHENHYDRAMINE HCL 50 MG/ML IJ SOLN
50.0000 mg | Freq: Once | INTRAMUSCULAR | Status: AC
Start: 2023-10-18 — End: 2023-10-17
  Administered 2023-10-17: 50 mg via INTRAVENOUS
  Filled 2023-10-17: qty 1

## 2023-10-17 MED ORDER — NICARDIPINE HCL IN NACL 20-0.86 MG/200ML-% IV SOLN
3.0000 mg/h | INTRAVENOUS | Status: DC
  Administered 2023-10-17: 5 mg/h via INTRAVENOUS
  Filled 2023-10-17: qty 200

## 2023-10-17 NOTE — ED Notes (Signed)
 Called to [facility] per MD Malvina @857pm [place] to [employer].

## 2023-10-17 NOTE — ED Triage Notes (Signed)
 Pt arrives via POV with c/o weakness for 1 week and left leg pain for 1 week. Per pt family member pt lives alone and they are uncertain if pt has had any recent falls. Family states that they found pt in the floor yesterday and pt told family that she had been in the floor all night because they couldn't get their left leg to work and get into the bed. Pt denies falling, hitting their leg, any arm/shoulder/neck/back pain. Pt is A&Ox3 during triage.

## 2023-10-17 NOTE — ED Notes (Signed)
 Rate adjusted due to BP

## 2023-10-17 NOTE — Progress Notes (Signed)
 Upon arrival Autumn [name] was being supported by her two children. Autumn [name] was able to engage and offered words of humor. Her children are aware of her sudden recent changes. Listen presence and validation was offered to daughter. Prayer was offered with [facility].     10/17/23 2200  Spiritual Encounters  Type of Visit Initial  Referral source Nurse (RN/NT/LPN)  Reason for visit Urgent spiritual support  OnCall Visit Yes

## 2023-10-17 NOTE — ED Notes (Signed)
 This RN attempted x2 IV sticks. Able to place 20g in R AC.

## 2023-10-17 NOTE — ED Notes (Signed)
 This RN placed pt on 2L [place]. Family states she wears 2L PRN. Pleth was verified at 98% then dropped to 89%. Breathing unlabored. NAD.

## 2023-10-17 NOTE — Progress Notes (Signed)
 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 It was reported that she was awake and able to follow commands. Right now I do not think she needs and EVD given her exam and CT scan. Admit to [facility] at [facility] as she has other medical issues as well right now.

## 2023-10-17 NOTE — ED Provider Notes (Addendum)
 [facility] Provider Note    Event Date/Time   First MD Initiated Contact with Patient 10/17/23 1716     (approximate)   History   Weakness   HPI Autumn [name] is a 78 y.o. female with history of COPD, HTN, HLD presenting today for weakness.  Patient reportedly was found down on the ground at home by family member today.  She reports she has been on the ground since yesterday and unable to get up due to pain in her hip and weakness.  Daughter at bedside noted she has been more confused over the past several days.  Patient reports only symptom of being diarrhea.  She otherwise denies chest pain, shortness of breath, cough, congestion, abdominal pain, nausea, vomiting, dysuria.  However, she does not fully know why she is here and does not remember falling.  Denies melanotic stools or blood in her stools.     Physical Exam   Triage Vital Signs: ED Triage Vitals  Encounter Vitals Group     BP 10/17/23 1441 (!) 135/45     Girls Systolic BP Percentile --      Girls Diastolic BP Percentile --      Boys Systolic BP Percentile --      Boys Diastolic BP Percentile --      Pulse Rate 10/17/23 1441 76     Resp 10/17/23 1441 16     Temp 10/17/23 1441 99.3 F (37.4 C)     Temp Source 10/17/23 1441 Oral     SpO2 10/17/23 1441 100 %     Weight 10/17/23 1443 103 lb (46.7 kg)     Height 10/17/23 1443 5' 6 (1.676 m)     Head Circumference --      Peak Flow --      Pain Score 10/17/23 1442 0     Pain Loc --      Pain Education --      Exclude from Growth Chart --     Most recent vital signs: Vitals:   10/17/23 2245 10/17/23 2315  BP: (!) 96/59 (!) 162/65  Pulse: 92 86  Resp: (!) 25 (!) 21  Temp:  (!) 100.5 F (38.1 C)  SpO2: 100%    Physical Exam: I have reviewed the vital signs and nursing notes. General: Awake, alert, no acute distress.  Nontoxic appearing.  Oriented to name and place but not year which is different from her baseline.  Does not  remember why she is here. Head:  Atraumatic, normocephalic.  No obvious hematomas to scalp. ENT:  EOM intact, PERRL. Oral mucosa is pink and moist with no lesions.  Periorbital hematoma around left eye which is nontender. Neck: Neck is supple with full range of motion, no C-spine tenderness palpation Cardiovascular:  RRR, No murmurs. Peripheral pulses palpable and equal bilaterally. Respiratory:  Symmetrical chest wall expansion.  No rhonchi, rales, or wheezes.  Good air movement throughout.  No use of accessory muscles.   Musculoskeletal:  No cyanosis or edema. Moving extremities with full ROM.  No tenderness palpation throughout bilateral upper extremities.  Mild tenderness palpation to left hip but full range of motion present.  Nontender to T and L-spine. Abdomen:  Soft, nontender, nondistended. Neuro:  GCS 14, moving all four extremities, interacting appropriately. Speech clear.  Cranial nerves II through XII intact.  Only disoriented to place but also has mild confusion and not remembering events from yesterday.  No obvious sensation or weakness deficits throughout bilateral  upper or lower extremities. Psych:  Calm, appropriate.   Skin:  Warm, dry, no rash.    ED Results / Procedures / Treatments   Labs (all labs ordered are listed, but only abnormal results are displayed) Labs Reviewed  COMPREHENSIVE METABOLIC PANEL WITH GFR - Abnormal; Notable for the following components:      Result Value   Potassium 3.4 (*)    Glucose, Bld 111 (*)    BUN 43 (*)    AST 58 (*)    Alkaline Phosphatase 31 (*)    All other components within normal limits  CBC - Abnormal; Notable for the following components:   WBC 15.0 (*)    RBC 2.33 (*)    Hemoglobin 6.4 (*)    HCT 21.7 (*)    MCHC 29.5 (*)    Platelets 422 (*)    All other components within normal limits  URINALYSIS, ROUTINE W REFLEX MICROSCOPIC - Abnormal; Notable for the following components:   Color, Urine YELLOW (*)    APPearance HAZY  (*)    Protein, ur 100 (*)    Leukocytes,Ua MODERATE (*)    Bacteria, UA MANY (*)    All other components within normal limits  CK - Abnormal; Notable for the following components:   Total CK 1,826 (*)    All other components within normal limits  CBG MONITORING, ED  TYPE AND SCREEN  PREPARE RBC (CROSSMATCH)  ABO/RH     EKG My EKG interpretation: Rate of 76, normal sinus rhythm, normal axis, normal intervals.  No acute ST elevations or depressions   RADIOLOGY Independently interpreted CT head showing concerns for new intraventricular hemorrhage with mild hydrocephalus.  Independently interpreted C-spine and max face CT with no acute pathology.  Independently interpreted CT abdomen/pelvis with possible hematoma to her left gluteal region.  X-ray of left hip negative   PROCEDURES:  Critical Care performed: Yes, see critical care procedure note(s)  .Critical Care  Performed by: Malvina Alm DASEN, MD Authorized by: Malvina Alm DASEN, MD   Critical care provider statement:    Critical care time (minutes):  60   Critical care was necessary to treat or prevent imminent or life-threatening deterioration of the following conditions: Intraventricular hemorrhage.   Critical care was time spent personally by me on the following activities:  Development of treatment plan with patient or surrogate, discussions with consultants, evaluation of patient's response to treatment, examination of patient, ordering and review of laboratory studies, ordering and review of radiographic studies, ordering and performing treatments and interventions, pulse oximetry, re-evaluation of patient's condition and review of old charts   I assumed direction of critical care for this patient from another provider in my specialty: no     Care discussed with: admitting provider      MEDICATIONS ORDERED IN ED: Medications  0.9 %  sodium chloride  infusion (10 mL/hr Intravenous Not Given 10/17/23 2114)  proparacaine   (ALCAINE ) 0.5 % ophthalmic solution 1 drop (has no administration in time range)  nicardipine  (CARDENE ) 20mg  in 0.86% saline IV infusion (0.1 mg/ml) (2.5 mg/hr Intravenous Infusion Verify 10/17/23 2334)  cefTRIAXone  (ROCEPHIN ) 2 g in sodium chloride  0.9 % 100 mL IVPB (0 g Intravenous Stopped 10/17/23 1930)  sodium chloride  0.9 % bolus 1,000 mL (0 mLs Intravenous Stopped 10/17/23 2315)  midazolam  (VERSED ) injection 1 mg (1 mg Intravenous Given 10/17/23 1951)  fluorescein  ophthalmic strip 1 strip (1 strip Left Eye Given 10/17/23 1953)  methylPREDNISolone  sodium succinate (SOLU-MEDROL ) 40 mg/mL injection 40  mg (40 mg Intravenous Given 10/17/23 2121)  diphenhydrAMINE  (BENADRYL ) capsule 50 mg ( Oral See Alternative 10/17/23 2336)    Or  diphenhydrAMINE  (BENADRYL ) injection 50 mg (50 mg Intravenous Given 10/17/23 2336)  acetaminophen  (TYLENOL ) tablet 1,000 mg (1,000 mg Oral Given 10/17/23 2334)     IMPRESSION / MDM / ASSESSMENT AND PLAN / ED COURSE  I reviewed the triage vital signs and the nursing notes.                              Differential diagnosis includes, but is not limited to, UTI, pneumonia, sepsis, AKI, dehydration, intra-abdominal trauma, ICH, cervical spine injury, facial fracture  Patient's presentation is most consistent with acute presentation with potential threat to life or bodily function.  Patient is a 78 year old female presenting today for weakness after being found down on the ground.  She is confused from her baseline here and does not remember why she is here or the year but can remember her name and the place.  Physical exam shows periorbital hematoma along the left eye but no other hematomas to the scalp.  Nontender throughout her spine.  Does have mild tenderness of her left hip so we will get x-ray along with CT imaging of her head and neck.  She was found on laboratory work to have profound anemia which is new with a hemoglobin of 6.4.  Hemoccult at this time shows no blood in  her stools and no black stools present.  Although there is high concern for GI bleed.  Will get CT abdomen/pelvis for further evaluation and rule out bleed anywhere else in the abdomen.  Mild leukocytosis present and she does have a UTI likely contributing to her weakness and altered mental status so we will give ceftriaxone .  CMP mostly reassuring.  Will give 1 L of fluids as well as 1 unit PRBC for anemia.  CT imaging of head shows new intraventricular hemorrhage with mild hydrocephalus.  Spoke with neurosurgery team here at [facility] who recommends transfer to [facility] for higher level of care with neurosurgery and ICU.  Blood pressure goal less than 160.  Patient's UA is positive for UTI and with her white count is meeting sepsis criteria.  Given ceftriaxone .  CK elevated at 1826 likely from laying down for prolonged period.  CT imaging of cervical spine and max/face negative.  X-ray left hip negative.  Separately, her CT abdomen/pelvis shows possible hematoma around her left gluteal region but otherwise negative.  Discussed case with ICU team and neurosurgery team at [facility].  They agree with transfer to ICU.  Patient started on nicardipine  given blood pressure gradually trending upwards to keep goal less than 160 systolic.  Was found to have a fever and given Tylenol .  Separately CT imaging also noted possible foreign body to the left eye.  Do not see anything obvious visual inspection.  Fluorescein  stain of the eye is Seidel test negative for open globe and no other corneal abrasion present.  Message was sent to ICU team at [facility] for ophthalmology evaluation in the morning.  Patient was signed out to oncoming provider pending transport to [facility].  She otherwise remained relatively hemodynamically stable with no significant tachycardia or hypotension.  The patient is on the cardiac monitor to evaluate for evidence of arrhythmia and/or significant heart rate changes. Clinical Course as of  10/17/23 2357  Mon Oct 17, 2023  1718 Urinalysis, Routine  w reflex microscopic -Urine, Clean Catch(!) Positive UTI [DW]  1941 Radiology - moderate volume IVH with mild hydrocephalus. C-spine fine. Left globe with metallic density present. [DW]  1953 Neurosurgery - recommends transfer to [facility] for IVH. SBP < 160.  [DW]  2231 Dr. Layman with [facility] to accept [DW]  2346 Seidel test negative at bedside for open globe or obvious abrasions.  Ophthalmology consultation in the morning and was given. [DW]    Clinical Course User Index [DW] Malvina Alm DASEN, MD     FINAL CLINICAL IMPRESSION(S) / ED DIAGNOSES   Final diagnoses:  Intraventricular hemorrhage (HCC)  Sepsis, due to unspecified organism, unspecified whether acute organ dysfunction present [facility])  Acute cystitis with hematuria  Altered mental status, unspecified altered mental status type  Anemia, unspecified type  Elevated CK     Rx / DC Orders   ED Discharge Orders     None        Note:  This document was prepared using Dragon voice recognition software and may include unintentional dictation errors.   Malvina Alm DASEN, MD 10/17/23 TEDDI    Malvina Alm DASEN, MD 10/17/23 [phone]

## 2023-10-17 NOTE — ED Notes (Addendum)
 Pt returned from CT at this time.

## 2023-10-17 NOTE — H&P (Signed)
 NAME:  Autumn [name], MRN:  [id number], DOB:  [birth year], LOS: 0 ADMISSION DATE:  10/18/2023 CONSULTATION DATE:  10/18/2023 REFERRING MD:  Malvina - [facility] EDP, CHIEF COMPLAINT: IVH, anemia  History of Present Illness:  78 year old woman who presented to [facility] 7/8 as a transfer from [facility] ED for IVH, anemia. PMHx significant for HTN, HLD, COPD (on 2LNC PRN), pulmonary sarcoidosis, anemia (baseline Hgb ~10), ovarian CA, glaucoma, OA.  Patient initially presented to [facility] ED with c/o weakness and LLE pain x 1 week. Unknown if patient has had any recent falls; however patient was reportedly found on the floor by family because she was unable to get her L leg to work. Some recent confusion per family but A&Ox3 on arrival to [facility]. Denies fever/chills, CP/SOB, n/v/d. Denies melena/hematochezia. Endorses weakness/dizziness, L leg pain (dull alternating with sharp, radiating from hip down to outside of her foot), denies numbness/paresthesias. Denies significant dysuria/hematuria.  On ED arrival, patient was afebrile (temp 99.30F) with HR 76, BP 135/45, RR 16, SpO2 100% on 2LNC. Labs were notable for WBC 15, Hgb 6.4 (baseline ~10), Plt 422. Na 141, K 3.4, CO2 21, BUN/Cr 43/0.87. AST 58 (25), ALT 23. Alk Phos 31, Tbili 0.9. CK 1826. UA +protein, moderate leuks, negative Hgb/glucose/nitrites. FOBT negative. CT Head/Maxillofacial/C-spine with moderate volume acute IVH, suspected mild hydrocephalus, 3mm metallic density anterior L globe (anterior chamber) c/f foreign body, no acute facial/cervical fractures. CT A/P demonstrated asymmetric expansion of the L sartorius/gluteus muscles with overlying soft tissue stranding c/f IM hematomas, ?trauma; ileus, nonobstructing renal stones. XR L Hip negative. Neurosurgery was consulted with recommendation for close monitoring, no immediate need for EVD. 1U PRBCs, 1L LR administered and broad-spectrum antibiotics initiated.  PCCM was consulted for ICU admission and transfer to [facility] for  further neurologic/neurosurgical care.  Pertinent Medical History:   Past Medical History:  Diagnosis Date   Allergy    Anemia    Arthritis    COPD (chronic obstructive pulmonary disease) (HCC)    Glaucoma    H/O uvulectomy    Hypertension    Osteoporosis    Ovarian cancer (HCC)    Postherpetic neuralgia    Pulmonary sarcoidosis (HCC)    Sarcoid    Significant Hospital Events: Including procedures, antibiotic start and stop dates in addition to other pertinent events   7/7 - Presented to [facility] ED for weakness/LLE pain x 1 week. Mild AMS/confusion for a few days per family. WBC 15, Hgb 6.4. CT Head with IVH, ?L eye FB. CT A/P with ?IM hematomas of L sartorius/L gluteus c/f trauma. NSGY consulted, holding on EVD. Recommend admission to ICU.  7/8 - PCCM consulted for admission and transfer.  Interim History / Subjective:  PCCM was consulted for ICU admission and transfer to [facility] for further neurologic/neurosurgical care.  Objective:  Blood pressure (!) 143/62, pulse 77, temperature 99.1 F (37.3 C), temperature source Oral, resp. rate (!) 21, SpO2 99%.       No intake or output data in the 24 hours ending 10/18/23 0340  There were no vitals filed for this visit.  Physical Examination: General: Chronically ill-appearing elderly woman in NAD. Pleasant and conversant. HEENT: Normocephapic, anicteric sclera, PERRL 3mm, +periorbital ecchymosis on L, moist mucous membranes. Neuro: Awake, oriented x 3. Responds to verbal stimuli. Following commands consistently. Moves all 4 extremities spontaneously. Relatively decreased strength LLE due to pain.  CV: RRR, +IV/VI blowing systolic murmur heard best at LLSB. PULM: Breathing even and unlabored on 2LNC. Lung fields  diminished throughout. GI: Soft, nontender, nondistended. Normoactive bowel sounds. Extremities: No LE edema noted. Mild TTP over L hip, lateral proximal thigh. Skin: Warm/dry, no rashes.  Resolved Hospital Problem List:     Assessment & Plan:  Acute intraventricular hemorrhage with mild hydrocephalus CT Head with moderate volume acute IVH, suspected mild hydrocephalus, no acute facial/cervical fractures.  - Admit to ICU for close monitoring - NSGY consulted, no indication for urgent EVD - Goal SBP < 160 - Cardene  gtt titrated to goal SBP - Repeat imaging per NSGY; no MRI due to metallic FB in L eye - Frequent neuro checks - Neuroprotective measures: HOB > 30 degrees, normoglycemia, normothermia, electrolytes WNL - PT/OT/SLP as able  Anemia, suspected ABLA on a background of chronic anemia - Trend H&H Q6H until stabilized - Monitor for signs of active bleeding - Transfuse for Hgb < 7.0 or hemodynamically significant bleeding  Intramuscular hematomas of L sartorius, L gluteus CT A/P 7/7 with asymmetric expansion of the L sartorius/gluteus muscles with overlying soft tissue stranding c/f IM hematomas. - Trend H&H as above - Check coags  ?UTI - Fluid resuscitation as tolerated - Trend WBC, fever curve - F/u Cx data - Continue broad-spectrum antibiotics (ceftriaxone , narrow as able)  Possible L anterior chamber metallic foreign body of the eye CT Head/Maxillofacial demonstrated 3mm metallic density anterior L globe (anterior chamber) c/f foreign body. - Ophthalmology evaluation 7/8AM - NO MRIs  COPD Pulmonary sarcoidosis - Supplemental O2 support for goal SpO2 88-92% - Bronchodilators (Brovana /Yupelri /Pulmicort  in place of home Trelegy, albuterol /DuoNebs PRN) - Continue home prednisone  5mg  daily - Pulmonary hygiene  HTN HLD Home medications: amlodipine , atenolol , losartan , atorvastatin . - Cardene  gtt as above - Resume home antihypertensives as clinically indicated - Resume statin - Cardiac monitoring  History of ovarian CA - Surveillance/outpatient monitoring per protocol  Anxiety - Continue home Zoloft , Remeron   Best Practice: (right click and Reselect all SmartList Selections  daily)   Diet/type: NPO for now DVT prophylaxis: SCDs GI prophylaxis: N/A Lines: N/A Foley:  N/A Code Status:  full code Last date of multidisciplinary goals of care discussion [Pending]  Labs:  CBC: Recent Labs  Lab 10/17/23 1445  WBC 15.0*  HGB 6.4*  HCT 21.7*  MCV 93.1  PLT 422*   Basic Metabolic Panel: Recent Labs  Lab 10/17/23 1445  NA 141  K 3.4*  CL 100  CO2 31  GLUCOSE 111*  BUN 43*  CREATININE 0.87  CALCIUM  9.2   GFR: Estimated Creatinine Clearance: 38.7 mL/min (by C-G formula based on SCr of 0.87 mg/dL). Recent Labs  Lab 10/17/23 1445  WBC 15.0*   Liver Function Tests: Recent Labs  Lab 10/17/23 1445  AST 58*  ALT 23  ALKPHOS 31*  BILITOT 0.9  PROT 7.3  ALBUMIN 3.8   No results for input(s): LIPASE, AMYLASE in the last 168 hours. No results for input(s): AMMONIA in the last 168 hours.  ABG:    Component Value Date/Time   PHART 7.46 (H) 02/08/2018 0500   PCO2ART 56 (H) 02/08/2018 0500   PO2ART 280 (H) 02/08/2018 0500   HCO3 39.8 (H) 02/08/2018 0500   O2SAT 99.9 02/08/2018 0500    Coagulation Profile: No results for input(s): INR, PROTIME in the last 168 hours.  Cardiac Enzymes: Recent Labs  Lab 10/17/23 1445  CKTOTAL 1,826*   HbA1C: No results found for: HGBA1C  CBG: Recent Labs  Lab 10/18/23 0332  GLUCAP 113*    Review of Systems:   Review of systems  completed with pertinent positives/negatives outlined in above HPI.  Past Medical History:  She,  has a past medical history of Allergy, Anemia, Arthritis, COPD (chronic obstructive pulmonary disease) (HCC), Glaucoma, H/O uvulectomy, Hypertension, Osteoporosis, Ovarian cancer (HCC), Postherpetic neuralgia, Pulmonary sarcoidosis (HCC), and Sarcoid.   Surgical History:   Past Surgical History:  Procedure Laterality Date   ABDOMINAL HYSTERECTOMY     BREAST EXCISIONAL BIOPSY Right    benign   COLONOSCOPY WITH PROPOFOL  N/A 07/30/2019   Procedure: COLONOSCOPY  WITH PROPOFOL ;  Surgeon: Toledo, Ladell POUR, MD;  Location: [facility] ENDOSCOPY;  Service: Gastroenterology;  Laterality: N/A;   DIAGNOSTIC LAPAROSCOPY     DILATION AND CURETTAGE OF UTERUS     EYE SURGERY     VULVAR LESION REMOVAL     Social History:   reports that she quit smoking about 37 years ago. Her smoking use included cigarettes. She started smoking about 57 years ago. She has never used smokeless tobacco. She reports that she does not drink alcohol and does not use drugs.   Family History:  Her family history includes Breast cancer (age of onset: 23) in her mother; Cancer in her mother; Hypertension in her father.   Allergies: Allergies  Allergen Reactions   Ivp Dye [Iodinated Contrast Media] Hives and Itching   Home Medications: Prior to Admission medications   Medication Sig Start Date End Date Taking? Authorizing Provider  acetaminophen  (TYLENOL ) 500 MG tablet Take 500 mg by mouth every 4 (four) hours as needed for mild pain or moderate pain.    [provider]  albuterol  (PROVENTIL ) (2.5 MG/3ML) 0.083% nebulizer solution INHALE 3 MLS BY NEBULIZATION EVERY 6 (SIX) HOURS AS NEEDED FOR WHEEZING 12/16/22   Tamea Dedra CROME, MD  albuterol  (VENTOLIN  HFA) 108 [phone] Base) MCG/ACT inhaler INHALE 2 PUFFS BY MOUTH EVERY 6 HOURS AS NEEDED FOR WHEEZE 12/16/22   Tamea Dedra CROME, MD  amLODipine  (NORVASC ) 5 MG tablet Take 2.5 mg by mouth daily.    [provider]  atenolol  (TENORMIN ) 50 MG tablet Take 50 mg by mouth 2 (two) times daily.     [provider]  atorvastatin  (LIPITOR) 10 MG tablet Take 1 tablet by mouth daily.     [provider]  cetirizine (ZYRTEC) 10 MG tablet Take 10 mg by mouth as needed for allergies.    [provider]  denosumab (PROLIA) 60 MG/ML SOSY injection Inject 60 mg into the skin every 6 (six) months.    [provider]  ipratropium-albuterol  (DUONEB) 0.5-2.5 (3) MG/3ML SOLN Take 3 mLs by nebulization every 6 (six)  hours as needed. 02/15/18   Sherial Bail, MD  losartan  (COZAAR ) 50 MG tablet Take 1 tablet by mouth daily. 04/20/21   [provider]  mirtazapine  (REMERON ) 7.5 MG tablet Take 7.5 mg by mouth at bedtime. 12/21/21   [provider]  ondansetron  (ZOFRAN -ODT) 4 MG disintegrating tablet Take 1 tablet (4 mg total) by mouth every 8 (eight) hours as needed. 06/27/21   Claudene Rover, MD  polyethylene glycol powder (GLYCOLAX /MIRALAX ) 17 GM/SCOOP powder Take by mouth. 09/12/20   [provider]  predniSONE  (DELTASONE ) 5 MG tablet TAKE 1 TABLET (5 MG TOTAL) BY MOUTH DAILY. 07/08/23   Tamea Dedra CROME, MD  Propylene Glycol 0.6 % SOLN Place 1 drop into both eyes daily as needed (dryness).    [provider]  sertraline  (ZOLOFT ) 25 MG tablet Take 25 mg by mouth daily. 02/16/17   [provider]  TRELEGY ELLIPTA  200-62.5-25  MCG/ACT AEPB TAKE 1 PUFF BY MOUTH EVERY DAY 12/10/22   Tamea Dedra CROME, MD    Critical care time:    The patient is critically ill with multiple organ system failure and requires high complexity decision making for assessment and support, frequent evaluation and titration of therapies, advanced monitoring, review of radiographic studies and interpretation of complex data.   Critical Care Time devoted to patient care services, exclusive of separately billable procedures, described in this note is 39 minutes.  Corean CHRISTELLA Jimmie Rueter, PA-C [facility] 10/18/23 3:40 AM  Please see [email or web address] for pager details.  From 7A-7P if no response, please call [phone] After hours, please call ELink [phone]

## 2023-10-18 ENCOUNTER — Inpatient Hospital Stay (HOSPITAL_COMMUNITY)
Admit: 2023-10-18 | Discharge: 2023-11-11 | DRG: 064 | Disposition: E | Source: Other Acute Inpatient Hospital | Attending: Internal Medicine | Admitting: Internal Medicine

## 2023-10-18 ENCOUNTER — Inpatient Hospital Stay (HOSPITAL_COMMUNITY)

## 2023-10-18 DIAGNOSIS — G928 Other toxic encephalopathy: Secondary | ICD-10-CM | POA: Diagnosis present

## 2023-10-18 DIAGNOSIS — R64 Cachexia: Secondary | ICD-10-CM | POA: Diagnosis present

## 2023-10-18 DIAGNOSIS — S0012XA Contusion of left eyelid and periocular area, initial encounter: Secondary | ICD-10-CM | POA: Diagnosis present

## 2023-10-18 DIAGNOSIS — I615 Nontraumatic intracerebral hemorrhage, intraventricular: Secondary | ICD-10-CM | POA: Diagnosis present

## 2023-10-18 DIAGNOSIS — Z91041 Radiographic dye allergy status: Secondary | ICD-10-CM

## 2023-10-18 DIAGNOSIS — Z79899 Other long term (current) drug therapy: Secondary | ICD-10-CM

## 2023-10-18 DIAGNOSIS — F32A Depression, unspecified: Secondary | ICD-10-CM | POA: Diagnosis present

## 2023-10-18 DIAGNOSIS — E876 Hypokalemia: Secondary | ICD-10-CM | POA: Diagnosis present

## 2023-10-18 DIAGNOSIS — J449 Chronic obstructive pulmonary disease, unspecified: Secondary | ICD-10-CM | POA: Diagnosis present

## 2023-10-18 DIAGNOSIS — G9340 Encephalopathy, unspecified: Secondary | ICD-10-CM | POA: Diagnosis not present

## 2023-10-18 DIAGNOSIS — G9389 Other specified disorders of brain: Secondary | ICD-10-CM | POA: Diagnosis present

## 2023-10-18 DIAGNOSIS — E785 Hyperlipidemia, unspecified: Secondary | ICD-10-CM | POA: Diagnosis present

## 2023-10-18 DIAGNOSIS — R569 Unspecified convulsions: Secondary | ICD-10-CM | POA: Diagnosis not present

## 2023-10-18 DIAGNOSIS — D860 Sarcoidosis of lung: Secondary | ICD-10-CM | POA: Diagnosis present

## 2023-10-18 DIAGNOSIS — Z9221 Personal history of antineoplastic chemotherapy: Secondary | ICD-10-CM

## 2023-10-18 DIAGNOSIS — N179 Acute kidney failure, unspecified: Secondary | ICD-10-CM | POA: Diagnosis present

## 2023-10-18 DIAGNOSIS — W19XXXA Unspecified fall, initial encounter: Secondary | ICD-10-CM | POA: Diagnosis present

## 2023-10-18 DIAGNOSIS — J9611 Chronic respiratory failure with hypoxia: Secondary | ICD-10-CM | POA: Diagnosis present

## 2023-10-18 DIAGNOSIS — Z87891 Personal history of nicotine dependence: Secondary | ICD-10-CM

## 2023-10-18 DIAGNOSIS — R531 Weakness: Secondary | ICD-10-CM | POA: Diagnosis present

## 2023-10-18 DIAGNOSIS — R54 Age-related physical debility: Secondary | ICD-10-CM | POA: Diagnosis present

## 2023-10-18 DIAGNOSIS — Z66 Do not resuscitate: Secondary | ICD-10-CM | POA: Diagnosis present

## 2023-10-18 DIAGNOSIS — E43 Unspecified severe protein-calorie malnutrition: Secondary | ICD-10-CM | POA: Diagnosis present

## 2023-10-18 DIAGNOSIS — R1310 Dysphagia, unspecified: Secondary | ICD-10-CM | POA: Diagnosis present

## 2023-10-18 DIAGNOSIS — D649 Anemia, unspecified: Secondary | ICD-10-CM | POA: Diagnosis present

## 2023-10-18 DIAGNOSIS — G919 Hydrocephalus, unspecified: Secondary | ICD-10-CM | POA: Diagnosis present

## 2023-10-18 DIAGNOSIS — R9082 White matter disease, unspecified: Secondary | ICD-10-CM | POA: Diagnosis present

## 2023-10-18 DIAGNOSIS — N390 Urinary tract infection, site not specified: Secondary | ICD-10-CM | POA: Diagnosis present

## 2023-10-18 DIAGNOSIS — I4891 Unspecified atrial fibrillation: Secondary | ICD-10-CM | POA: Diagnosis present

## 2023-10-18 DIAGNOSIS — H0520 Unspecified exophthalmos: Secondary | ICD-10-CM | POA: Diagnosis present

## 2023-10-18 DIAGNOSIS — Z7952 Long term (current) use of systemic steroids: Secondary | ICD-10-CM

## 2023-10-18 DIAGNOSIS — I639 Cerebral infarction, unspecified: Secondary | ICD-10-CM | POA: Diagnosis not present

## 2023-10-18 DIAGNOSIS — Z681 Body mass index (BMI) 19 or less, adult: Secondary | ICD-10-CM | POA: Diagnosis not present

## 2023-10-18 DIAGNOSIS — Z90710 Acquired absence of both cervix and uterus: Secondary | ICD-10-CM

## 2023-10-18 DIAGNOSIS — M810 Age-related osteoporosis without current pathological fracture: Secondary | ICD-10-CM | POA: Diagnosis present

## 2023-10-18 DIAGNOSIS — I10 Essential (primary) hypertension: Secondary | ICD-10-CM | POA: Diagnosis not present

## 2023-10-18 DIAGNOSIS — H409 Unspecified glaucoma: Secondary | ICD-10-CM | POA: Diagnosis present

## 2023-10-18 DIAGNOSIS — L8990 Pressure ulcer of unspecified site, unspecified stage: Secondary | ICD-10-CM | POA: Insufficient documentation

## 2023-10-18 DIAGNOSIS — T796XXA Traumatic ischemia of muscle, initial encounter: Secondary | ICD-10-CM | POA: Diagnosis present

## 2023-10-18 DIAGNOSIS — I161 Hypertensive emergency: Secondary | ICD-10-CM | POA: Diagnosis present

## 2023-10-18 DIAGNOSIS — R29810 Facial weakness: Secondary | ICD-10-CM | POA: Diagnosis present

## 2023-10-18 DIAGNOSIS — Z8249 Family history of ischemic heart disease and other diseases of the circulatory system: Secondary | ICD-10-CM

## 2023-10-18 DIAGNOSIS — Z9981 Dependence on supplemental oxygen: Secondary | ICD-10-CM

## 2023-10-18 DIAGNOSIS — R748 Abnormal levels of other serum enzymes: Secondary | ICD-10-CM | POA: Diagnosis present

## 2023-10-18 DIAGNOSIS — I272 Pulmonary hypertension, unspecified: Secondary | ICD-10-CM | POA: Diagnosis present

## 2023-10-18 DIAGNOSIS — R000 Tachycardia, unspecified: Secondary | ICD-10-CM | POA: Diagnosis present

## 2023-10-18 DIAGNOSIS — H5461 Unqualified visual loss, right eye, normal vision left eye: Secondary | ICD-10-CM | POA: Diagnosis present

## 2023-10-18 DIAGNOSIS — S300XXA Contusion of lower back and pelvis, initial encounter: Secondary | ICD-10-CM | POA: Diagnosis present

## 2023-10-18 DIAGNOSIS — H02401 Unspecified ptosis of right eyelid: Secondary | ICD-10-CM | POA: Diagnosis present

## 2023-10-18 DIAGNOSIS — M1990 Unspecified osteoarthritis, unspecified site: Secondary | ICD-10-CM | POA: Diagnosis present

## 2023-10-18 DIAGNOSIS — R9089 Other abnormal findings on diagnostic imaging of central nervous system: Secondary | ICD-10-CM | POA: Diagnosis not present

## 2023-10-18 DIAGNOSIS — Y92009 Unspecified place in unspecified non-institutional (private) residence as the place of occurrence of the external cause: Secondary | ICD-10-CM | POA: Diagnosis not present

## 2023-10-18 DIAGNOSIS — Z7951 Long term (current) use of inhaled steroids: Secondary | ICD-10-CM

## 2023-10-18 DIAGNOSIS — Z7189 Other specified counseling: Secondary | ICD-10-CM | POA: Diagnosis not present

## 2023-10-18 DIAGNOSIS — Z8543 Personal history of malignant neoplasm of ovary: Secondary | ICD-10-CM

## 2023-10-18 LAB — COMPREHENSIVE METABOLIC PANEL WITH GFR
ALT: 25 U/L (ref 0–44)
AST: 46 U/L — ABNORMAL HIGH (ref 15–41)
Albumin: 3.3 g/dL — ABNORMAL LOW (ref 3.5–5.0)
Alkaline Phosphatase: 30 U/L — ABNORMAL LOW (ref 38–126)
Anion gap: 9 (ref 5–15)
BUN: 24 mg/dL — ABNORMAL HIGH (ref 8–23)
CO2: 28 mmol/L (ref 22–32)
Calcium: 8.6 mg/dL — ABNORMAL LOW (ref 8.9–10.3)
Chloride: 104 mmol/L (ref 98–111)
Creatinine, Ser: 0.71 mg/dL (ref 0.44–1.00)
GFR, Estimated: >60 mL/min (ref >60)
Glucose, Bld: 118 mg/dL — ABNORMAL HIGH (ref 70–99)
Potassium: 3.6 mmol/L (ref 3.5–5.1)
Sodium: 141 mmol/L (ref 135–145)
Total Bilirubin: 1.3 mg/dL — ABNORMAL HIGH (ref 0.0–1.2)
Total Protein: 6.4 g/dL — ABNORMAL LOW (ref 6.5–8.1)

## 2023-10-18 LAB — CBC
HCT: 24.1 % — ABNORMAL LOW (ref 36.0–46.0)
HCT: 26.6 % — ABNORMAL LOW (ref 36.0–46.0)
HCT: 24.6 % — ABNORMAL LOW (ref 36.0–46.0)
Hemoglobin: 7.8 g/dL — ABNORMAL LOW (ref 12.0–15.0)
Hemoglobin: 8.7 g/dL — ABNORMAL LOW (ref 12.0–15.0)
Hemoglobin: 7.9 g/dL — ABNORMAL LOW (ref 12.0–15.0)
MCH: 29 pg (ref 26.0–34.0)
MCH: 29.5 pg (ref 26.0–34.0)
MCH: 29.4 pg (ref 26.0–34.0)
MCHC: 32.4 g/dL (ref 30.0–36.0)
MCHC: 32.7 g/dL (ref 30.0–36.0)
MCHC: 32.1 g/dL (ref 30.0–36.0)
MCV: 89.6 fL (ref 80.0–100.0)
MCV: 90.2 fL (ref 80.0–100.0)
MCV: 91.4 fL (ref 80.0–100.0)
Platelets: 392 K/uL (ref 150–400)
Platelets: 442 K/uL — ABNORMAL HIGH (ref 150–400)
Platelets: 424 K/uL — ABNORMAL HIGH (ref 150–400)
RBC: 2.69 MIL/uL — ABNORMAL LOW (ref 3.87–5.11)
RBC: 2.95 MIL/uL — ABNORMAL LOW (ref 3.87–5.11)
RBC: 2.69 MIL/uL — ABNORMAL LOW (ref 3.87–5.11)
RDW: 14.6 % (ref 11.5–15.5)
RDW: 14.7 % (ref 11.5–15.5)
RDW: 14.7 % (ref 11.5–15.5)
WBC: 11.6 K/uL — ABNORMAL HIGH (ref 4.0–10.5)
WBC: 13.5 K/uL — ABNORMAL HIGH (ref 4.0–10.5)
WBC: 12.9 K/uL — ABNORMAL HIGH (ref 4.0–10.5)
nRBC: 0 % (ref 0.0–0.2)
nRBC: 0.1 % (ref 0.0–0.2)
nRBC: 0.2 % (ref 0.0–0.2)

## 2023-10-18 LAB — BPAM RBC
Blood Product Expiration Date: 202507152359
ISSUE DATE / TIME: 202507072055
Unit Type and Rh: 5100

## 2023-10-18 LAB — BASIC METABOLIC PANEL WITH GFR
Anion gap: 11 (ref 5–15)
BUN: 26 mg/dL — ABNORMAL HIGH (ref 8–23)
CO2: 30 mmol/L (ref 22–32)
Calcium: 9.1 mg/dL (ref 8.9–10.3)
Chloride: 101 mmol/L (ref 98–111)
Creatinine, Ser: 0.73 mg/dL (ref 0.44–1.00)
GFR, Estimated: >60 mL/min (ref >60)
Glucose, Bld: 103 mg/dL — ABNORMAL HIGH (ref 70–99)
Potassium: 3.6 mmol/L (ref 3.5–5.1)
Sodium: 142 mmol/L (ref 135–145)

## 2023-10-18 LAB — TYPE AND SCREEN
ABO/RH(D): O POS
ABO/RH(D): O POS
Antibody Screen: NEGATIVE
Antibody Screen: NEGATIVE
Unit division: 0

## 2023-10-18 LAB — MRSA NEXT GEN BY PCR, NASAL: MRSA by PCR Next Gen: NOT DETECTED

## 2023-10-18 LAB — CK: Total CK: 1151 U/L — ABNORMAL HIGH (ref 38–234)

## 2023-10-18 LAB — PHOSPHORUS: Phosphorus: 3 mg/dL (ref 2.5–4.6)

## 2023-10-18 LAB — PROTIME-INR
INR: 1 (ref 0.8–1.2)
Prothrombin Time: 13.9 s (ref 11.4–15.2)

## 2023-10-18 LAB — GLUCOSE, CAPILLARY
Glucose-Capillary: 105 mg/dL — ABNORMAL HIGH (ref 70–99)
Glucose-Capillary: 111 mg/dL — ABNORMAL HIGH (ref 70–99)
Glucose-Capillary: 113 mg/dL — ABNORMAL HIGH (ref 70–99)
Glucose-Capillary: 131 mg/dL — ABNORMAL HIGH (ref 70–99)
Glucose-Capillary: 80 mg/dL (ref 70–99)
Glucose-Capillary: 84 mg/dL (ref 70–99)
Glucose-Capillary: 88 mg/dL (ref 70–99)

## 2023-10-18 LAB — MAGNESIUM: Magnesium: 2.3 mg/dL (ref 1.7–2.4)

## 2023-10-18 MED ORDER — BUDESONIDE 0.25 MG/2ML IN SUSP
0.2500 mg | Freq: Two times a day (BID) | RESPIRATORY_TRACT | Status: DC
  Administered 2023-10-18 – 2023-10-27 (×20): 0.25 mg via RESPIRATORY_TRACT
  Filled 2023-10-18 (×20): qty 2

## 2023-10-18 MED ORDER — ALBUTEROL SULFATE (2.5 MG/3ML) 0.083% IN NEBU
2.5000 mg | INHALATION_SOLUTION | RESPIRATORY_TRACT | Status: DC | PRN
  Administered 2023-10-22 – 2023-10-24 (×2): 2.5 mg via RESPIRATORY_TRACT
  Filled 2023-10-18 (×2): qty 3

## 2023-10-18 MED ORDER — HYDRALAZINE HCL 20 MG/ML IJ SOLN
10.0000 mg | Freq: Once | INTRAMUSCULAR | Status: AC
  Administered 2023-10-18: 10 mg via INTRAVENOUS
  Filled 2023-10-18: qty 1

## 2023-10-18 MED ORDER — IPRATROPIUM-ALBUTEROL 0.5-2.5 (3) MG/3ML IN SOLN: 3.0000 mL | RESPIRATORY_TRACT | Status: DC | PRN

## 2023-10-18 MED ORDER — ATORVASTATIN CALCIUM 10 MG PO TABS
10.0000 mg | ORAL_TABLET | Freq: Every day | ORAL | Status: DC
  Administered 2023-10-18: 10 mg via ORAL
  Filled 2023-10-18: qty 1

## 2023-10-18 MED ORDER — AMLODIPINE BESYLATE 5 MG PO TABS: 2.5000 mg | ORAL_TABLET | Freq: Every day | ORAL | Status: DC

## 2023-10-18 MED ORDER — REVEFENACIN 175 MCG/3ML IN SOLN
175.0000 ug | Freq: Every day | RESPIRATORY_TRACT | Status: DC
  Administered 2023-10-18 – 2023-10-27 (×10): 175 ug via RESPIRATORY_TRACT
  Filled 2023-10-18 (×10): qty 3

## 2023-10-18 MED ORDER — SODIUM CHLORIDE 0.9 % IV SOLN
2.0000 g | INTRAVENOUS | Status: DC
  Administered 2023-10-18: 2 g via INTRAVENOUS
  Filled 2023-10-18: qty 20

## 2023-10-18 MED ORDER — ONDANSETRON HCL 4 MG/2ML IJ SOLN: 4.0000 mg | Freq: Four times a day (QID) | INTRAMUSCULAR | Status: DC | PRN

## 2023-10-18 MED ORDER — CHLORHEXIDINE GLUCONATE CLOTH 2 % EX PADS
6.0000 | MEDICATED_PAD | Freq: Every day | CUTANEOUS | Status: DC
  Administered 2023-10-18 – 2023-10-25 (×8): 6 via TOPICAL

## 2023-10-18 MED ORDER — DOCUSATE SODIUM 100 MG PO CAPS
100.0000 mg | ORAL_CAPSULE | Freq: Two times a day (BID) | ORAL | Status: DC | PRN
  Administered 2023-10-21: 100 mg via ORAL
  Filled 2023-10-18: qty 1

## 2023-10-18 MED ORDER — LABETALOL HCL 5 MG/ML IV SOLN
10.0000 mg | Freq: Once | INTRAVENOUS | Status: AC
  Administered 2023-10-18: 10 mg via INTRAVENOUS
  Filled 2023-10-18: qty 4

## 2023-10-18 MED ORDER — AMLODIPINE BESYLATE 5 MG PO TABS
5.0000 mg | ORAL_TABLET | Freq: Every day | ORAL | Status: DC
  Administered 2023-10-18: 5 mg via ORAL
  Filled 2023-10-18: qty 1

## 2023-10-18 MED ORDER — PREDNISONE 5 MG PO TABS
5.0000 mg | ORAL_TABLET | Freq: Every day | ORAL | Status: DC
  Administered 2023-10-18 – 2023-10-23 (×6): 5 mg via ORAL
  Filled 2023-10-18 (×7): qty 1

## 2023-10-18 MED ORDER — HYDRALAZINE HCL 20 MG/ML IJ SOLN
10.0000 mg | INTRAMUSCULAR | Status: DC | PRN
  Administered 2023-10-19 – 2023-10-27 (×10): 10 mg via INTRAVENOUS
  Filled 2023-10-18 (×11): qty 1

## 2023-10-18 MED ORDER — LOSARTAN POTASSIUM 50 MG PO TABS
25.0000 mg | ORAL_TABLET | Freq: Every day | ORAL | Status: DC
  Administered 2023-10-18 – 2023-10-19 (×2): 25 mg via ORAL
  Filled 2023-10-18 (×2): qty 1

## 2023-10-18 MED ORDER — LABETALOL HCL 5 MG/ML IV SOLN
20.0000 mg | INTRAVENOUS | Status: DC | PRN
  Administered 2023-10-20 – 2023-10-25 (×6): 20 mg via INTRAVENOUS
  Filled 2023-10-18 (×8): qty 4

## 2023-10-18 MED ORDER — LABETALOL HCL 5 MG/ML IV SOLN
10.0000 mg | INTRAVENOUS | Status: DC | PRN
  Administered 2023-10-18: 10 mg via INTRAVENOUS
  Filled 2023-10-18 (×2): qty 4

## 2023-10-18 MED ORDER — LACTATED RINGERS IV SOLN: INTRAVENOUS | Status: AC

## 2023-10-18 MED ORDER — AMLODIPINE BESYLATE 5 MG PO TABS
10.0000 mg | ORAL_TABLET | Freq: Every day | ORAL | Status: DC
  Administered 2023-10-19 – 2023-10-23 (×5): 10 mg via ORAL
  Filled 2023-10-18: qty 1
  Filled 2023-10-18: qty 2
  Filled 2023-10-18 (×2): qty 1
  Filled 2023-10-18: qty 2
  Filled 2023-10-18: qty 1

## 2023-10-18 MED ORDER — ORAL CARE MOUTH RINSE: 15.0000 mL | OROMUCOSAL | Status: DC | PRN

## 2023-10-18 MED ORDER — ACETAMINOPHEN 325 MG PO TABS
650.0000 mg | ORAL_TABLET | ORAL | Status: DC | PRN
  Administered 2023-10-19 – 2023-10-27 (×9): 650 mg via ORAL
  Filled 2023-10-18 (×9): qty 2

## 2023-10-18 MED ORDER — ARFORMOTEROL TARTRATE 15 MCG/2ML IN NEBU
15.0000 ug | INHALATION_SOLUTION | Freq: Two times a day (BID) | RESPIRATORY_TRACT | Status: DC
  Administered 2023-10-18 – 2023-10-27 (×20): 15 ug via RESPIRATORY_TRACT
  Filled 2023-10-18 (×20): qty 2

## 2023-10-18 MED ORDER — INSULIN ASPART 100 UNIT/ML IJ SOLN
0.0000 [IU] | INTRAMUSCULAR | Status: DC
  Administered 2023-10-19 – 2023-10-24 (×10): 1 [IU] via SUBCUTANEOUS
  Administered 2023-10-24: 2 [IU] via SUBCUTANEOUS
  Administered 2023-10-24 – 2023-10-26 (×6): 1 [IU] via SUBCUTANEOUS
  Administered 2023-10-26: 2 [IU] via SUBCUTANEOUS
  Administered 2023-10-27: 1 [IU] via SUBCUTANEOUS
  Administered 2023-10-27: 2 [IU] via SUBCUTANEOUS
  Administered 2023-10-27 (×3): 1 [IU] via SUBCUTANEOUS

## 2023-10-18 MED ORDER — NICARDIPINE HCL IN NACL 20-0.86 MG/200ML-% IV SOLN
0.0000 mg/h | INTRAVENOUS | Status: DC
  Administered 2023-10-18: 5 mg/h via INTRAVENOUS
  Filled 2023-10-18 (×2): qty 200

## 2023-10-18 MED ORDER — POLYETHYLENE GLYCOL 3350 17 G PO PACK
17.0000 g | PACK | Freq: Every day | ORAL | Status: DC | PRN
  Administered 2023-10-21: 17 g via ORAL
  Filled 2023-10-18: qty 1

## 2023-10-18 MED ORDER — MIRTAZAPINE 15 MG PO TABS
7.5000 mg | ORAL_TABLET | Freq: Every day | ORAL | Status: DC
  Administered 2023-10-18 – 2023-10-27 (×8): 7.5 mg via ORAL
  Filled 2023-10-18 (×9): qty 1

## 2023-10-18 MED ORDER — SERTRALINE HCL 50 MG PO TABS
25.0000 mg | ORAL_TABLET | Freq: Every day | ORAL | Status: DC
  Administered 2023-10-18 – 2023-10-23 (×6): 25 mg via ORAL
  Filled 2023-10-18 (×8): qty 1

## 2023-10-18 NOTE — Progress Notes (Signed)
 Brief Progress Note:  Brief HPI:   78 year old female with history of pulmonary sarcoidosis, hip pain, COPD, and HTN who presented to [facility] with encephalopathy after being found down by daughter. She was found to have intraventricular hemorrhage, transferred to [facility] hospital for evaluation by neurosurgery.   Subjective    Patient is confused and disoriented, but awake and follows commands. She reports thigh pain.  Objective    Blood pressure (!) 149/128, pulse 93, temperature 99.4 F (37.4 C), temperature source Oral, resp. rate (!) 30, weight 48.8 kg, SpO2 100%.    FiO2 (%):  [28 %] 28 %   Intake/Output Summary (Last 24 hours) at 10/18/2023 1633 Last data filed at 10/18/2023 1600 Gross per 24 hour  Intake 399.76 ml  Output 800 ml  Net -400.24 ml    Examination: Physical Exam Constitutional:      General: She is not in acute distress.    Appearance: She is ill-appearing.  Cardiovascular:     Rate and Rhythm: Normal rate and regular rhythm.     Pulses: Normal pulses.     Heart sounds: Normal heart sounds.  Pulmonary:     Effort: Pulmonary effort is normal.     Breath sounds: Rales present.     Comments: Decreased air entry biltaterally Neurological:     Mental Status: She is alert. She is disoriented.    Labs:    Latest Ref Rng & Units 10/18/2023   10:17 AM 10/18/2023    5:30 AM 10/17/2023    2:45 PM  CBC  WBC 4.0 - 10.5 K/uL 13.5  11.6  15.0   Hemoglobin 12.0 - 15.0 g/dL 8.7  7.8  6.4   Hematocrit 36.0 - 46.0 % 26.6  24.1  21.7   Platelets 150 - 400 K/uL 442  392  422       Latest Ref Rng & Units 10/18/2023   10:17 AM 10/18/2023    5:30 AM 10/17/2023    2:45 PM  BMP  Glucose 70 - 99 mg/dL 896  881  888   BUN 8 - 23 mg/dL 26  24  43   Creatinine 0.44 - 1.00 mg/dL 9.26  9.28  9.12   Sodium 135 - 145 mmol/L 142  141  141   Potassium 3.5 - 5.1 mmol/L 3.6  3.6  3.4   Chloride 98 - 111 mmol/L 101  104  100   CO2 22 - 32 mmol/L 30  28  31    Calcium  8.9 - 10.3 mg/dL 9.1  8.6   9.2       Latest Ref Rng & Units 10/18/2023    5:30 AM 10/17/2023    2:45 PM 06/26/2021   10:00 PM  Hepatic Function  Total Protein 6.5 - 8.1 g/dL 6.4  7.3  7.4   Albumin 3.5 - 5.0 g/dL 3.3  3.8  4.6   AST 15 - 41 U/L 46  58  25   ALT 0 - 44 U/L 25  23  23    Alk Phosphatase 38 - 126 U/L 30  31  42   Total Bilirubin 0.0 - 1.2 mg/dL 1.3  0.9  0.7      Assessment and Plan   78 year old female presenting with altered mental status secondary to interventricular hemorrhage. She is admitted for frequent neuro-checks, neurosurgical consultation as well as blood pressure control. She is also being managed for rhabdomyolysis and intramuscular hematoma.  #Toxic Metabolic Encephalopathy #Intraventricular hemorrhage #Hypertensive Emergency #  UTI #Pulmonary Sarcoidosis  #Chronic Prednisone  Use #Rhabdomyolysis  Neuro - encephalopathy secondary to intraventricular hemorrhage. Neurosurgery team consulted, no intervention recommended. Will continue with frequent neuro-checks, with stat head CT as indicated should her mental status decline. My main concern is the potential development of obstructive hydrocephalus from the blood clots which could necessitate an EVD. Appreciate input from ophthalmology. Will continue with q2hour neurochecks for now. Repeat CT head in AM.  CV - HTN emergency managed initially with nicardipine  gtt, now with uptitration of oral medications (amlodipine , losartan ) and PRN IV Labetalol  and hydralazine . Goal SBP < 160. Does have a previous TTE showing elevated PASP, which could have been secondary to her underlying sarcoidosis vs HFpEF. Monitor for the development of pulmonary edema and heart failure with fluids.  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 On  at baseline requirement. Continued on home nebulizers (? COPD with history of smoking) and home prednisone . Monitor for pulmonary  edema.  Renal - Kidney function at baseline, despite development of rhabdomyolysis after being found down. Continue gentle IV fluids for rhabdo.  Endo - ICU glycemic protocol  Hem/Onc - intramuscular hematoma, monitoring CBC's regularly. Off anticoagulation  ID - Ceftriaxone  for treatment of UTI. Urine cultures sent.   Belva November, MD [facility] 10/18/2023 4:36 PM    The patient is critically ill due to intraventricular hemorrhage, encephalopathy.  Critical care was necessary to treat or prevent imminent or life-threatening deterioration. Critical care time was spent by me on the following activities: development of a treatment plan with the patient and/or surrogate as well as nursing, discussions with consultants, evaluation of the patient's response to treatment, examination of the patient, obtaining a history from the patient or surrogate, ordering and performing treatments and interventions, ordering and review of laboratory studies, ordering and review of radiographic studies, review of telemetry data including pulse oximetry, re-evaluation of patient's condition and participation in multidisciplinary rounds.   I personally spent 55 minutes providing critical care not including any separately billable procedures.

## 2023-10-18 NOTE — Progress Notes (Addendum)
 eLink Physician-Brief Progress Note Patient Name: Autumn [name] DOB: 1945/12/07 MRN: [id number]   Date of Service  10/18/2023  HPI/Events of Note  eICU Brief new admit note: 11 F with hx of Severe COPD, Pulmonary HTN, PEM, Chronic respiratory failure, sarcoidosis, long term use of steroids, HTN, depression, ovarian cancer Transferred from [facility] for   IVB-ICB. No need for EVD per neurosurgery input from [facility].  Anemia: FOBT neg. 1 PRBC, follow levels. CT abd: suspected intramuscular hematoma. If not better consider CTA abdomen.   FB-metallic in eye. Opthal evaluation   Chronic respiratory failure from Severe COPD/sarcoidosis/pulmonary HTN, on nasal o2. Steroids.     Data: CT reviewed which shows intraventricular hemorrhage with mild communicating hydrocephalus  K at 3.4, cr normal AST 58 CK: lying down for unknown time: elevated 1826 Wbc 15 K, anemia at 6.4 ( baseline 10). UA abnormal: LE/wbc/bacteria +.  CT abd/pelvis w/o contrast: 1. Asymmetric expansion of the left sartorius and gluteus muscles with overlying subcutaneous soft tissue stranding, suspicious for intramuscular hematomas in the setting of possible trauma. 2. Gas-filled mildly dilated rectum, which may reflect ileus. 3. Bilateral punctate nonobstructing renal stones. 4.  Aortic Atherosclerosis (ICD10-I70.0).   CT head, neck: . Moderate volume of acute intraventricular hemorrhage, detailed above. 2. Suspected mild hydrocephalus. 3. Approximately 3 mm metallic density in the anterior left globe at the anterior chamber, suspicious for foreign body. Recommend correlation with direct inspection and ophthalmology consultation. 4. No facial fracture. 5. No evidence of acute fracture or traumatic in the cervical spine.  Camera: On nasal o2. Resting On cardene  drip HR 83. MAP > 65. Sats good.    eICU Interventions  Close neuro check Aspiration and sz precautions CBG goals < 180 If worsening , consider repeat  CT head, if increasing bleed, re consult neurosurgery team for EVD. Keep SBP < 160 or lower. Follow CT head per protocol. Follow Hg post PRBC, fluids.  Able to protect airways.  VTE: SCD.      Intervention Category Major Interventions: Other:;Change in mental status - evaluation and management Intermediate Interventions: Bleeding - evaluation and treatment with blood products Evaluation Type: New Patient Evaluation  [name] 10/18/2023, 1:31 AM

## 2023-10-18 NOTE — ED Notes (Signed)
 ..  EMTALA: REQUIRED DOCUMENTATION COMPLETED AND REVIEWED BY WRITER PRIOR TO PT TRANSFER MD REASSESSMENT EMTALA RN SECTION TRANSFER E-SIGN VS WITHIN REQUIRED TIME

## 2023-10-18 NOTE — Progress Notes (Signed)
 Reviewed latest CT head, no significant change. There is no role for neurosurgery at this time as she does not need an EVD. Discussed plan of care with Dr. Onetha and he agrees.

## 2023-10-18 NOTE — TOC CM/SW Note (Addendum)
 Transition of Care [facility]) - Inpatient Brief Assessment   Patient Details  Name: [name] MRN: [id number] Date of Birth: 05/02/1945  Transition of Care [facility]) CM/SW Contact:    Lauraine FORBES Saa, LCSW Phone Number: 10/18/2023, 8:57 AM   Clinical Narrative:  8:57 AM Per chart review, patient resides at home alone. Patient has a PCP and insurance. Patient has SNF history at [facility]. Patient does not have HH history. Patient has DME (nebulizer supplies) history with [facility]. Patient does not have SDOH needs (as of March 20th, 2025). Patient's preferred pharmacy's are [facility] [address] Ar[facility] [address], [facility] [address] [facility] [address], and [place] [address] [place]. No TOC needs were identified at this time. TOC will continue to follow and be available to assist.   Transition of Care Asessment: Insurance and Status: Insurance coverage has been reviewed Patient has primary care physician: Yes Home environment has been reviewed: Private Residence Prior level of function:: N/A Prior/Current Home Services: No current home services Social Drivers of Health Review: SDOH reviewed no interventions necessary (As of March 20th, 2025) Readmission risk has been reviewed: Yes Transition of care needs: no transition of care needs at this time

## 2023-10-18 NOTE — ED Notes (Signed)
 [facility] Rep Tammy called in with bed assignment @ 1154PM/ 80M BED 6/Report Number @ [phone]/Accepting is Dr. JINNY Na.

## 2023-10-18 NOTE — Consult Note (Signed)
 BRIEF CONSULT NOTE  I was consulted to evaluate for incidental metallic intraocular foreign body noted on CT scan. I reviewed her medical records and imaging. 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 I was able to determine from the coronal and sagittal cuts that the foreign body in question is consistent with shunt placement at [facility]. No further ophthalmic intervention is indicated at this time. I do recommend outpatient follow up with her regular ophthalmologist. Please feel free to call me if there are any ongoing concerns.  JONELLE Glendia Gaudy, MD [phone]

## 2023-10-19 ENCOUNTER — Inpatient Hospital Stay (HOSPITAL_COMMUNITY)

## 2023-10-19 ENCOUNTER — Encounter (HOSPITAL_COMMUNITY)

## 2023-10-19 DIAGNOSIS — G928 Other toxic encephalopathy: Secondary | ICD-10-CM | POA: Diagnosis not present

## 2023-10-19 DIAGNOSIS — T796XXA Traumatic ischemia of muscle, initial encounter: Secondary | ICD-10-CM

## 2023-10-19 DIAGNOSIS — I615 Nontraumatic intracerebral hemorrhage, intraventricular: Secondary | ICD-10-CM | POA: Diagnosis not present

## 2023-10-19 DIAGNOSIS — N179 Acute kidney failure, unspecified: Secondary | ICD-10-CM | POA: Diagnosis not present

## 2023-10-19 DIAGNOSIS — G919 Hydrocephalus, unspecified: Secondary | ICD-10-CM | POA: Diagnosis not present

## 2023-10-19 DIAGNOSIS — G9340 Encephalopathy, unspecified: Secondary | ICD-10-CM

## 2023-10-19 DIAGNOSIS — R569 Unspecified convulsions: Secondary | ICD-10-CM

## 2023-10-19 DIAGNOSIS — L8990 Pressure ulcer of unspecified site, unspecified stage: Secondary | ICD-10-CM | POA: Insufficient documentation

## 2023-10-19 LAB — CBC
HCT: 25.5 % — ABNORMAL LOW (ref 36.0–46.0)
HCT: 26.2 % — ABNORMAL LOW (ref 36.0–46.0)
Hemoglobin: 7.8 g/dL — ABNORMAL LOW (ref 12.0–15.0)
Hemoglobin: 8.2 g/dL — ABNORMAL LOW (ref 12.0–15.0)
MCH: 28.6 pg (ref 26.0–34.0)
MCH: 29.1 pg (ref 26.0–34.0)
MCHC: 30.6 g/dL (ref 30.0–36.0)
MCHC: 31.3 g/dL (ref 30.0–36.0)
MCV: 93.4 fL (ref 80.0–100.0)
MCV: 92.9 fL (ref 80.0–100.0)
Platelets: 449 K/uL — ABNORMAL HIGH (ref 150–400)
Platelets: 441 K/uL — ABNORMAL HIGH (ref 150–400)
RBC: 2.73 MIL/uL — ABNORMAL LOW (ref 3.87–5.11)
RBC: 2.82 MIL/uL — ABNORMAL LOW (ref 3.87–5.11)
RDW: 15 % (ref 11.5–15.5)
RDW: 14.7 % (ref 11.5–15.5)
WBC: 10.5 K/uL (ref 4.0–10.5)
WBC: 11.8 K/uL — ABNORMAL HIGH (ref 4.0–10.5)
nRBC: 0 % (ref 0.0–0.2)
nRBC: 0.3 % — ABNORMAL HIGH (ref 0.0–0.2)

## 2023-10-19 LAB — BASIC METABOLIC PANEL WITH GFR
Anion gap: 9 (ref 5–15)
BUN: 21 mg/dL (ref 8–23)
CO2: 29 mmol/L (ref 22–32)
Calcium: 8.7 mg/dL — ABNORMAL LOW (ref 8.9–10.3)
Chloride: 103 mmol/L (ref 98–111)
Creatinine, Ser: 0.82 mg/dL (ref 0.44–1.00)
GFR, Estimated: >60 mL/min (ref >60)
Glucose, Bld: 90 mg/dL (ref 70–99)
Potassium: 3.3 mmol/L — ABNORMAL LOW (ref 3.5–5.1)
Sodium: 141 mmol/L (ref 135–145)

## 2023-10-19 LAB — GLUCOSE, CAPILLARY
Glucose-Capillary: 80 mg/dL (ref 70–99)
Glucose-Capillary: 86 mg/dL (ref 70–99)
Glucose-Capillary: 91 mg/dL (ref 70–99)
Glucose-Capillary: 123 mg/dL — ABNORMAL HIGH (ref 70–99)
Glucose-Capillary: 115 mg/dL — ABNORMAL HIGH (ref 70–99)
Glucose-Capillary: 75 mg/dL (ref 70–99)

## 2023-10-19 LAB — CK: Total CK: 683 U/L — ABNORMAL HIGH (ref 38–234)

## 2023-10-19 MED ORDER — LEVETIRACETAM 500 MG PO TABS: 500.0000 mg | ORAL_TABLET | Freq: Two times a day (BID) | ORAL | Status: DC

## 2023-10-19 MED ORDER — FENTANYL CITRATE PF 50 MCG/ML IJ SOSY
12.5000 ug | PREFILLED_SYRINGE | Freq: Once | INTRAMUSCULAR | Status: AC
  Administered 2023-10-19: 12.5 ug via INTRAVENOUS
  Filled 2023-10-19: qty 1

## 2023-10-19 MED ORDER — SODIUM CHLORIDE 0.9 % IV SOLN
1.0000 g | INTRAVENOUS | Status: AC
  Administered 2023-10-19 – 2023-10-20 (×2): 1 g via INTRAVENOUS
  Filled 2023-10-19 (×2): qty 10

## 2023-10-19 MED ORDER — LOSARTAN POTASSIUM 50 MG PO TABS
50.0000 mg | ORAL_TABLET | Freq: Every day | ORAL | Status: DC
  Administered 2023-10-20 – 2023-10-23 (×4): 50 mg via ORAL
  Filled 2023-10-19 (×5): qty 1

## 2023-10-19 MED ORDER — POTASSIUM CHLORIDE CRYS ER 20 MEQ PO TBCR
40.0000 meq | EXTENDED_RELEASE_TABLET | Freq: Once | ORAL | Status: AC
  Administered 2023-10-19: 40 meq via ORAL
  Filled 2023-10-19: qty 2

## 2023-10-19 MED ORDER — LEVETIRACETAM (KEPPRA) 500 MG/5 ML ADULT IV PUSH
500.0000 mg | Freq: Two times a day (BID) | INTRAVENOUS | Status: DC
  Administered 2023-10-19: 500 mg via INTRAVENOUS
  Filled 2023-10-19: qty 5

## 2023-10-19 MED ORDER — LOSARTAN POTASSIUM 50 MG PO TABS
25.0000 mg | ORAL_TABLET | Freq: Once | ORAL | Status: AC
  Administered 2023-10-19: 25 mg via ORAL
  Filled 2023-10-19: qty 1

## 2023-10-19 MED ORDER — BUTALBITAL-APAP-CAFFEINE 50-325-40 MG PO TABS
1.0000 | ORAL_TABLET | Freq: Three times a day (TID) | ORAL | Status: DC | PRN
  Administered 2023-10-19 – 2023-10-21 (×4): 1 via ORAL
  Filled 2023-10-19 (×5): qty 1

## 2023-10-19 MED ORDER — GADOBUTROL 1 MMOL/ML IV SOLN
4.0000 mL | Freq: Once | INTRAVENOUS | Status: AC | PRN
  Administered 2023-10-19: 4 mL via INTRAVENOUS

## 2023-10-19 MED ORDER — ATENOLOL 25 MG PO TABS
50.0000 mg | ORAL_TABLET | Freq: Two times a day (BID) | ORAL | Status: DC
  Administered 2023-10-19 – 2023-10-23 (×10): 50 mg via ORAL
  Filled 2023-10-19 (×3): qty 1
  Filled 2023-10-19 (×2): qty 2
  Filled 2023-10-19 (×2): qty 1
  Filled 2023-10-19: qty 2
  Filled 2023-10-19: qty 1
  Filled 2023-10-19: qty 2
  Filled 2023-10-19 (×2): qty 1

## 2023-10-19 NOTE — Consult Note (Signed)
 NEUROLOGY CONSULT NOTE   Date of service: October 19, 2023 Patient Name: Autumn [name] MRN:  [id number] DOB:  1945-07-26 Chief Complaint: Possible seizure activity in the setting of IVH Requesting Provider: Isadora Hose, MD  History of Present Illness  Autumn [name] is a 78 y.o. female with a PMHx of arthritis, COPD, glaucoma, HTN, osteoporosis, ovarian cancer, postherpetic neuralgia and pulmonary sarcoidosis who presented to the [facility] ED on 7/7 after being found on the floor by family on 7/6. The patient had told family that she had been on the floor all night due to not being able to move her left leg enough to be able to get into bed. Family had also endorsed increased confusion over the past several days, but she was A&Ox3 on presentation to the ED. CT head revealed  acute intraventricular hemorrhage involving both lateral ventricles, with associated ventriculomegaly. She was transferred to the ICU at [facility] on 7/8 for further management. Neurosurgery reviewed the patient's head CT on 7/8 and felt that there was no indication for surgical intervention at that time. She has been encephalopathic during her stay here.   Early this AM, she was noted to have hand shaking movements. Repeat CT head was then obtained, which was stable. She was loaded with Keppra  and LTM was ordered. Neurology has been consulted to further evaluate for possible focal seizure activity.     ROS  As per HPI. Unable to obtain a comprehensive ROS due to apathy and lethargy.   Past History   Past Medical History:  Diagnosis Date   Allergy    Anemia    Arthritis    COPD (chronic obstructive pulmonary disease) (HCC)    Glaucoma    H/O uvulectomy    Hypertension    Osteoporosis    Ovarian cancer (HCC)    Postherpetic neuralgia    Pulmonary sarcoidosis (HCC)    Sarcoid     Past Surgical History:  Procedure Laterality Date   ABDOMINAL HYSTERECTOMY     BREAST EXCISIONAL BIOPSY Right    benign   COLONOSCOPY WITH  PROPOFOL  N/A 07/30/2019   Procedure: COLONOSCOPY WITH PROPOFOL ;  Surgeon: Toledo, Ladell POUR, MD;  Location: [facility] ENDOSCOPY;  Service: Gastroenterology;  Laterality: N/A;   DIAGNOSTIC LAPAROSCOPY     DILATION AND CURETTAGE OF UTERUS     EYE SURGERY     VULVAR LESION REMOVAL      Family History: Family History  Problem Relation Age of Onset   Breast cancer Mother 61   Cancer Mother    Hypertension Father     Social History  reports that she quit smoking about 37 years ago. Her smoking use included cigarettes. She started smoking about 57 years ago. She has never used smokeless tobacco. She reports that she does not drink alcohol and does not use drugs.  Allergies  Allergen Reactions   Ivp Dye [Iodinated Contrast Media] Hives and Itching    Medications   Current Facility-Administered Medications:    acetaminophen  (TYLENOL ) tablet 650 mg, 650 mg, Oral, Q4H PRN, Ilah Krabbe M, PA-C, 650 mg at 10/19/23 9761   albuterol  (PROVENTIL ) (2.5 MG/3ML) 0.083% nebulizer solution 2.5 mg, 2.5 mg, Nebulization, Q4H PRN, Ilah, Stephanie M, PA-C   amLODipine  (NORVASC ) tablet 10 mg, 10 mg, Oral, Daily, Dgayli, Khabib, MD   arformoterol  (BROVANA ) nebulizer solution 15 mcg, 15 mcg, Nebulization, BID, 15 mcg at 10/18/23 2048 **AND** revefenacin  (YUPELRI ) nebulizer solution 175 mcg, 175 mcg, Nebulization, Daily, 175 mcg at 10/18/23 0846 **AND**  budesonide  (PULMICORT ) nebulizer solution 0.25 mg, 0.25 mg, Nebulization, BID, Ilah Krabbe M, PA-C, 0.25 mg at 10/18/23 2048   cefTRIAXone  (ROCEPHIN ) 2 g in sodium chloride  0.9 % 100 mL IVPB, 2 g, Intravenous, Q24H, Hindel, Leah, MD, Stopped at 10/18/23 1823   Chlorhexidine  Gluconate Cloth 2 % PADS 6 each, 6 each, Topical, Daily, Layman Raisin, DO, 6 each at 10/18/23 1002   docusate sodium  (COLACE) capsule 100 mg, 100 mg, Oral, BID PRN, Ilah Krabbe M, PA-C   hydrALAZINE  (APRESOLINE ) injection 10 mg, 10 mg, Intravenous, Q4H PRN, Isadora Hose, MD,  10 mg at 10/19/23 0121   insulin  aspart (novoLOG ) injection 0-9 Units, 0-9 Units, Subcutaneous, Q4H, Ilah Krabbe M, PA-C, 1 Units at 10/19/23 0028   ipratropium-albuterol  (DUONEB) 0.5-2.5 (3) MG/3ML nebulizer solution 3 mL, 3 mL, Nebulization, Q4H PRN, Ilah, Stephanie M, PA-C   labetalol  (NORMODYNE ) injection 20 mg, 20 mg, Intravenous, Q2H PRN, Isadora Hose, MD   lactated ringers  infusion, , Intravenous, Continuous, Hindel, Leah, MD, Last Rate: 75 mL/hr at 10/19/23 0200, Infusion Verify at 10/19/23 0200   levETIRAcetam  (KEPPRA ) undiluted injection 500 mg, 500 mg, Intravenous, Q12H, Ogan, Okoronkwo U, MD   losartan  (COZAAR ) tablet 25 mg, 25 mg, Oral, Daily, Hindel, Leah, MD, 25 mg at 10/18/23 1200   mirtazapine  (REMERON ) tablet 7.5 mg, 7.5 mg, Oral, QHS, Reese, Stephanie M, PA-C, 7.5 mg at 10/18/23 2115   nicardipine  (CARDENE ) 20mg  in 0.86% saline 200ml IV infusion (0.1 mg/ml), 0-15 mg/hr, Intravenous, Continuous, Hindel, Leah, MD, Stopped at 10/18/23 1231   ondansetron  (ZOFRAN ) injection 4 mg, 4 mg, Intravenous, Q6H PRN, Ilah Krabbe HERO, PA-C   Oral care mouth rinse, 15 mL, Mouth Rinse, PRN, Layman Raisin, DO   polyethylene glycol (MIRALAX  / GLYCOLAX ) packet 17 g, 17 g, Oral, Daily PRN, Ilah, Stephanie M, PA-C   predniSONE  (DELTASONE ) tablet 5 mg, 5 mg, Oral, Daily, Ilah, Stephanie M, PA-C, 5 mg at 10/18/23 1004   sertraline  (ZOLOFT ) tablet 25 mg, 25 mg, Oral, Daily, Ilah Krabbe M, PA-C, 25 mg at 10/18/23 1005  Vitals   Vitals:   10/19/23 0200 10/19/23 0215 10/19/23 0230 10/19/23 0245  BP: (!) 139/55 (!) 145/62 137/65   Pulse: 91  90 92  Resp: (!) 21 (!) 22 19 (!) 21  Temp:      TempSrc:      SpO2: 100%  100% 94%  Weight:        Body mass index is 17.36 kg/m.   Physical Exam   Constitutional: Frail-appearing elderly female  Psych: Flattened affect and apathy Eyes: No scleral injection.  HENT: No OP obstruction.  Head: Left periorbital bruising Respiratory:  Effort normal, non-labored breathing.    Neurologic Examination   Mental Status: Mildly lethargic with decreased level of alertness. Mild abulia. Speech is sparse but fluent with intact naming and comprehension. Partially oriented to time and place. Thinks that she is at home. Cooperative with exam. Able to follow all commands without difficulty but is somewhat apathetic. Cranial Nerves: II: Temporal visual fields intact with no extinction to DSS. PERRL  III,IV, VI: No ptosis. EOMI.  V: Temp sensation equal bilaterally  VII: Smile symmetric VIII: Hearing intact to voice IX,X: No hoarseness XI: Symmetric shoulder shrug XII: Midline tongue extension Motor: BUE 4+/5 proximally and distally BLE with poor effort. Able to move distally against resistance.   No pronator drift.  No jerking, twitching or posturing noted.  Sensory: Light touch intact throughout, bilaterally. No extinction to DSS.  Deep Tendon Reflexes:  1+ and symmetric throughout Cerebellar: No ataxia with FNF bilaterally, but with slow movements. No tremor noted.   Gait: Deferred  Labs/Imaging/Neurodiagnostic studies   CBC:  Recent Labs  Lab 2023-10-24 1017 10-24-23 1827  WBC 13.5* 12.9*  HGB 8.7* 7.9*  HCT 26.6* 24.6*  MCV 90.2 91.4  PLT 442* 424*   Basic Metabolic Panel:  Lab Results  Component Value Date   NA 142 2023/10/24   K 3.6 10-24-2023   CO2 30 24-Oct-2023   GLUCOSE 103 (H) 2023/10/24   BUN 26 (H) 10-24-23   CREATININE 0.73 10-24-2023   CALCIUM  9.1 2023/10/24   GFRNONAA >60 October 24, 2023   GFRAA >60 02/13/2018     Lab Results  Component Value Date   INR 1.0 2023-10-24     ASSESSMENT  Elsi I Vajda is a 78 y.o. female admitted with IVH, LLE weakness and encephalopathy. Early this AM, she was noted to have hand shaking movements. Repeat CT head was then obtained, which was stable. She was loaded with Keppra  and LTM was ordered. Neurology has been consulted to further evaluate for possible focal  seizure activity.  - Exam reveals mild lethargy and poor orientation to time and place. No lateralized weakness, but is diffusely weak. No jerking, twitching, posturing or other seizure-like activity seen.  - CT head: No significant interval change in acute intraventricular hemorrhage involving both lateral ventricles. Associated ventriculomegaly, not appreciably changed. No midline shift or ventricular trapping. No other new acute intracranial abnormality. - Na normal. Mildly hypokalemic. Ca 8.7 in the context of low albumin. BUN now normalized. Cr normal. CK elevated at 683. WBC normal. Hgb low at 7.8. Platelets elevated at 449. Glucose 90. U/A unremarkable.  - Impression: Right hand movements suspicious for possible seizure activity per CCM. It is felt that the movements are most likely due to fidgeting based on the description provided by nursing staff, but will further evaluate with EEG.   RECOMMENDATIONS  - LTM EEG is pending.  - Continue Keppra  at 500 mg BID for now. May discontinue if EEG is negative for epileptiform discharges.  - Encourage hydration - Management of IVH per Neurosurgery ______________________________________________________________________    Bonney SHARK, Devereaux Grayson, MD [facility]

## 2023-10-19 NOTE — Progress Notes (Signed)
 EEG complete, results are pending.

## 2023-10-19 NOTE — Evaluation (Signed)
 Physical Therapy Evaluation Patient Details Name: Autumn [name] MRN: [id number] DOB: 1945-12-11 Today's Date: 10/19/2023  History of Present Illness  Patient is a 78 y/o female admitted 10/18/23 due to L LE weakness, encephalopathy found to have IVH  both lateral ventricles.  PMH positive for COPD, arthritis, glaucoma, HTN, ovarian cancer, postherpetic neuralgia, pulmonary sarcoidosis.  Clinical Impression  Patient presents with decreased mobility due to generalized weakness, decreased balance, decreased cognition, decreased activity tolerance and decreased safety awareness.  Previously she was living mainly on her own with some help for higher level IADL's and using cane vs walker occasionally.  Currently she needs +2 A for up to recliner then back to bed using RW.  She will continue to benefit from skilled PT in the acute setting and from post-acute inpatient rehab (<3 hours/day) prior to d/c home.         If plan is discharge home, recommend the following: A lot of help with bathing/dressing/bathroom;Two people to help with walking and/or transfers;Assist for transportation;Supervision due to cognitive status   Can travel by private vehicle   No    Equipment Recommendations Other (comment) (TBA)  Recommendations for Other Services       Functional Status Assessment Patient has had a recent decline in their functional status and demonstrates the ability to make significant improvements in function in a reasonable and predictable amount of time.     Precautions / Restrictions Precautions Precautions: Fall Precaution/Restrictions Comments: SBP<160      Mobility  Bed Mobility Overal bed mobility: Needs Assistance Bed Mobility: Supine to Sit, Sit to Supine     Supine to sit: Mod assist, HOB elevated, Used rails, +2 for safety/equipment Sit to supine: +2 for physical assistance, Mod assist   General bed mobility comments: cues for reaching to rail and bringing legs off EOB with A  and A to lift trunk; to supine assist for legs and trunk    Transfers Overall transfer level: Needs assistance Equipment used: Rolling walker (2 wheels) Transfers: Sit to/from Stand, Bed to chair/wheelchair/BSC Sit to Stand: Mod assist, +2 physical assistance   Step pivot transfers: Mod assist, +2 safety/equipment, +2 physical assistance       General transfer comment: cues for hand placement, lifting help to stand, assist for lines and walker management and cues for direction stepping to recliner, assisted back to bed as RN reports transport to take pt to MRI; assisted to step to bed with pt more flexed and more help returning to supine    Ambulation/Gait                  Stairs            Wheelchair Mobility     Tilt Bed    Modified Rankin (Stroke Patients Only) Modified Rankin (Stroke Patients Only) Pre-Morbid Rankin Score: Slight disability Modified Rankin: Severe disability     Balance Overall balance assessment: Needs assistance   Sitting balance-Leahy Scale: Fair Sitting balance - Comments: able to sit EOB with supervision   Standing balance support: Bilateral upper extremity supported, Reliant on assistive device for balance, During functional activity Standing balance-Leahy Scale: Poor                               Pertinent Vitals/Pain Pain Assessment Pain Assessment: Faces Faces Pain Scale: Hurts little more Pain Location: headache Pain Descriptors / Indicators: Headache Pain Intervention(s): Monitored during session  Home Living Family/patient expects to be discharged to:: Private residence Living Arrangements: Alone Available Help at Discharge: Family;Available PRN/intermittently Type of Home: House Home Access: Stairs to enter Entrance Stairs-Rails: Right Entrance Stairs-Number of Steps: 3   Home Layout: One level Home Equipment: Grab bars - tub/shower;Hand held shower head;Shower seat;Rolling Environmental consultant (2 wheels)       Prior Function Prior Level of Function : Independent/Modified Independent;History of Falls (last six months)             Mobility Comments: using cane or walker intermittently ADLs Comments: daughter helped with some meals and pt had housekeeper     Extremity/Trunk Assessment   Upper Extremity Assessment Upper Extremity Assessment: Generalized weakness    Lower Extremity Assessment Lower Extremity Assessment: Generalized weakness;LLE deficits/detail;RLE deficits/detail RLE Deficits / Details: AAROM WFL, strength hip flexion 3/5, knee extension 4/5, ankle DF at least 3/5 LLE Deficits / Details: AAROM WFL, strength hip flexion 2+/5, knee extension 3+/5, ankle DF 3-/5    Cervical / Trunk Assessment Cervical / Trunk Assessment: Kyphotic  Communication   Communication Communication: No apparent difficulties    Cognition Arousal: Lethargic Behavior During Therapy: Flat affect   PT - Cognitive impairments: Orientation, Attention, Initiation, Problem solving, Safety/Judgement   Orientation impairments: Place, Time, Situation                   PT - Cognition Comments: stated at home and not sure why she is here when oriented to hospital, cues for eyes open and attention forward and to find chair then bed when transferring, slow processing Following commands: Impaired Following commands impaired: Follows one step commands inconsistently, Follows one step commands with increased time     Cueing       General Comments General comments (skin integrity, edema, etc.): BP stable with mobility; daughter arrived during session, reports they hope to hire help at home once pt finishes rehab    Exercises     Assessment/Plan    PT Assessment Patient needs continued PT services  PT Problem List Decreased strength;Decreased activity tolerance;Decreased balance;Decreased mobility;Decreased knowledge of precautions;Decreased safety awareness;Decreased cognition       PT  Treatment Interventions DME instruction;Gait training;Functional mobility training;Therapeutic activities;Therapeutic exercise;Balance training;Neuromuscular re-education;Cognitive remediation;Patient/family education    PT Goals (Current goals can be found in the Care Plan section)  Acute Rehab PT Goals Patient Stated Goal: return home PT Goal Formulation: With patient/family Time For Goal Achievement: 11/02/23 Potential to Achieve Goals: Good    Frequency Min 2X/week     Co-evaluation               AM-PAC PT 6 Clicks Mobility  Outcome Measure Help needed turning from your back to your side while in a flat bed without using bedrails?: A Lot Help needed moving from lying on your back to sitting on the side of a flat bed without using bedrails?: Total Help needed moving to and from a bed to a chair (including a wheelchair)?: Total Help needed standing up from a chair using your arms (e.g., wheelchair or bedside chair)?: Total Help needed to walk in hospital room?: Total Help needed climbing 3-5 steps with a railing? : Total 6 Click Score: 7    End of Session Equipment Utilized During Treatment: Gait belt Activity Tolerance: Patient limited by fatigue Patient left: in bed;with call bell/phone within reach;with family/visitor present;with nursing/sitter in room   PT Visit Diagnosis: Other abnormalities of gait and mobility (R26.89);Other symptoms and signs involving the nervous  system (R29.898);Muscle weakness (generalized) (M62.81);History of falling (Z91.81)    Time: 8451-8386 PT Time Calculation (min) (ACUTE ONLY): 25 min   Charges:   PT Evaluation $PT Eval Moderate Complexity: 1 Mod PT Treatments $Therapeutic Activity: 8-22 mins PT General Charges $$ ACUTE PT VISIT: 1 Visit         Micheline Portal, PT Acute Rehabilitation Services [phone] 10/19/2023   Montie Portal 10/19/2023, 6:05 PM

## 2023-10-19 NOTE — Progress Notes (Signed)
 NAME:  Autumn [name], MRN:  [id number], DOB:  [birth year], LOS: 1 ADMISSION DATE:  10/18/2023  History of Present Illness:  78 year old female with history of pulmonary sarcoidosis, hip pain, COPD (on 2L Robinson PRN), HTN presented initially to [facility] with encephalopathy after being found down at home by family.  Found to have IVH and was transferred to [facility] for evaluation by neurosurgery.  Pertinent  Medical History  Also anemia (baseline Hgb ~10), ovarian cancer, glaucoma, OA  Significant Hospital Events: Including procedures, antibiotic start and stop dates in addition to other pertinent events   7/7 - Presented to [facility] ED for weakness/LLE pain x 1 week. Mild AMS/confusion for a few days per family. WBC 15, Hgb 6.4. CT Head with IVH, ?L eye FB. CT A/P with ?IM hematomas of L sartorius/L gluteus c/f trauma. NSGY consulted, holding on EVD. Recommend admission to ICU.  7/8 - PCCM consulted for admission and transfer, NSG recommended no intervention.  Started nicardipine  drip for HTN emergency, pressures improved and this was stopped, oral agents started (losartan , amlodipine ) 7/9 -R hand tremor, CT head stable, spot EEG negative, started Keppra , neuro consult  Interim History / Subjective:  Headache on the way to CT with right hand tremor, loaded with Keppra , placed on cEEG and neurology consulted.  Head CT stable  Objective    Blood pressure (!) 146/57, pulse 80, temperature 99 F (37.2 C), temperature source Oral, resp. rate (!) 21, weight 43.9 kg, SpO2 100%.        Intake/Output Summary (Last 24 hours) at 10/19/2023 0659 Last data filed at 10/19/2023 0600 Gross per 24 hour  Intake 1513.28 ml  Output 1050 ml  Net 463.28 ml   Filed Weights   10/18/23 0500 10/19/23 0448  Weight: 48.8 kg 43.9 kg    Examination: General: Sitting up in bed, awake and alert, somewhat confused but no acute distress HENT: MMM, normocephalic and atraumatic Lungs: CTAB, normal WOB on  [facility] Cardiovascular: RRR, normal S1/S2, no M/R/G Abdomen: Normoactive bowel sounds, soft, nontender, nondistended Extremities: SCDs in place, warm, well-perfused Neuro: Oriented to self, location, not date, asking where family members are repeatedly.  Moving all extremities spontaneously.  No focal deficits  Resolved problem list   Assessment and Plan  NEURO #Toxic metabolic encephalopathy #Intraventricular hemorrhage Encephalopathy improved today.  Possible seizure activity earlier this morning, CT head stable.  S/p Keppra  load, neurology consulted.  Overnight neurochecks stable.  Hemoglobin stable at 7.8. -- Appreciate neuro recommendations  --spot EEG with mild diffuse encephalopathy, no seizure or epileptiform discharges  --Brain MRI with and without contrast  --Added atenolol  50 mg twice daily -- Continue Keppra  500 mg twice daily --CTM with daily CBC --Likely will need repeat CT head in a.m. -- Stable for transfer to progressive care  CARDIOVASCULAR #Hypertensive emergency Off nicardipine  drip since 1230 yesterday.  Got hydralazine  10 mg x 1 overnight for SBP 176.  Pressures at goal (<160) this morning. --Amlodipine  10 mg daily --Losartan  25 mg daily  INFECTIOUS #UTI WBC normalized this a.m. and afebrile overnight.  Do not have urine culture because preantibiotic UA collected at outside facility.  Patient asymptomatic today -- 1g CTX x2 more days for empiric coverage  PULMONOLOGY #Pulmonary sarcoidosis/chronic prednisone  use -- Stable on home oxygen supplementation --Continue 5 mg prednisone  daily  RENAL #Rhabdomyolysis Creatinine remains WNL.  AM CK 683 from 1151 --CTM -- Continue LR 75 mg/h  HEME/ONC # LLE intramuscular hematoma, stable Hgb stable as above --CTM  Best Practice (right click and Reselect all SmartList Selections daily)   Diet/type: Regular consistency (see orders) DVT prophylaxis SCD Pressure ulcer(s): N/A GI prophylaxis: N/A Lines:  N/A Foley:  N/A Code Status:  full code Last date of multidisciplinary goals of care discussion [pending]  Labs   CBC: Recent Labs  Lab 10/17/23 1445 10/18/23 0530 10/18/23 1017 10/18/23 1827 10/19/23 0558  WBC 15.0* 11.6* 13.5* 12.9* 10.5  HGB 6.4* 7.8* 8.7* 7.9* 7.8*  HCT 21.7* 24.1* 26.6* 24.6* 25.5*  MCV 93.1 89.6 90.2 91.4 93.4  PLT 422* 392 442* 424* 449*    Basic Metabolic Panel: Recent Labs  Lab 10/17/23 1445 10/18/23 0530 10/18/23 1017  NA 141 141 142  K 3.4* 3.6 3.6  CL 100 104 101  CO2 31 28 30   GLUCOSE 111* 118* 103*  BUN 43* 24* 26*  CREATININE 0.87 0.71 0.73  CALCIUM  9.2 8.6* 9.1  MG  --  2.3  --   PHOS  --  3.0  --    GFR: Estimated Creatinine Clearance: 39.5 mL/min (by C-G formula based on SCr of 0.73 mg/dL). Recent Labs  Lab 10/18/23 0530 10/18/23 1017 10/18/23 1827 10/19/23 0558  WBC 11.6* 13.5* 12.9* 10.5    Liver Function Tests: Recent Labs  Lab 10/17/23 1445 10/18/23 0530  AST 58* 46*  ALT 23 25  ALKPHOS 31* 30*  BILITOT 0.9 1.3*  PROT 7.3 6.4*  ALBUMIN 3.8 3.3*   No results for input(s): LIPASE, AMYLASE in the last 168 hours. No results for input(s): AMMONIA in the last 168 hours.  ABG    Component Value Date/Time   PHART 7.46 (H) 02/08/2018 0500   PCO2ART 56 (H) 02/08/2018 0500   PO2ART 280 (H) 02/08/2018 0500   HCO3 39.8 (H) 02/08/2018 0500   O2SAT 99.9 02/08/2018 0500     Coagulation Profile: Recent Labs  Lab 10/18/23 0530  INR 1.0    Cardiac Enzymes: Recent Labs  Lab 10/17/23 1445 10/18/23 1017  CKTOTAL 1,826* 1,151*    HbA1C: No results found for: HGBA1C  CBG: Recent Labs  Lab 10/18/23 1144 10/18/23 1502 10/18/23 1933 10/18/23 2338 10/19/23 0322  GLUCAP 84 80 88 131* 80    Review of Systems:   none  Past Medical History:  She,  has a past medical history of Allergy, Anemia, Arthritis, COPD (chronic obstructive pulmonary disease) (HCC), Glaucoma, H/O uvulectomy, Hypertension,  Osteoporosis, Ovarian cancer (HCC), Postherpetic neuralgia, Pulmonary sarcoidosis (HCC), and Sarcoid.   Surgical History:   Past Surgical History:  Procedure Laterality Date   ABDOMINAL HYSTERECTOMY     BREAST EXCISIONAL BIOPSY Right    benign   COLONOSCOPY WITH PROPOFOL  N/A 07/30/2019   Procedure: COLONOSCOPY WITH PROPOFOL ;  Surgeon: Toledo, Ladell POUR, MD;  Location: [facility] ENDOSCOPY;  Service: Gastroenterology;  Laterality: N/A;   DIAGNOSTIC LAPAROSCOPY     DILATION AND CURETTAGE OF UTERUS     EYE SURGERY     VULVAR LESION REMOVAL       Social History:   reports that she quit smoking about 37 years ago. Her smoking use included cigarettes. She started smoking about 57 years ago. She has never used smokeless tobacco. She reports that she does not drink alcohol and does not use drugs.   Family History:  Her family history includes Breast cancer (age of onset: 68) in her mother; Cancer in her mother; Hypertension in her father.   Allergies Allergies  Allergen Reactions   Ivp Dye [Iodinated Contrast Media] Hives and Itching  Home Medications  Prior to Admission medications   Medication Sig Start Date End Date Taking? Authorizing Provider  acetaminophen  (TYLENOL ) 500 MG tablet Take 500 mg by mouth every 4 (four) hours as needed for mild pain or moderate pain.    [provider]  albuterol  (PROVENTIL ) (2.5 MG/3ML) 0.083% nebulizer solution INHALE 3 MLS BY NEBULIZATION EVERY 6 (SIX) HOURS AS NEEDED FOR WHEEZING 12/16/22   Tamea Dedra CROME, MD  albuterol  (VENTOLIN  HFA) 108 (90 Base) MCG/ACT inhaler INHALE 2 PUFFS BY MOUTH EVERY 6 HOURS AS NEEDED FOR WHEEZE 12/16/22   Tamea Dedra CROME, MD  amLODipine  (NORVASC ) 5 MG tablet Take 2.5 mg by mouth daily.    [provider]  atenolol  (TENORMIN ) 50 MG tablet Take 50 mg by mouth 2 (two) times daily.     [provider]  atorvastatin  (LIPITOR) 10 MG tablet Take 1 tablet by mouth daily.     [provider]   cetirizine (ZYRTEC) 10 MG tablet Take 10 mg by mouth as needed for allergies.    [provider]  denosumab (PROLIA) 60 MG/ML SOSY injection Inject 60 mg into the skin every 6 (six) months.    [provider]  ipratropium-albuterol  (DUONEB) 0.5-2.5 (3) MG/3ML SOLN Take 3 mLs by nebulization every 6 (six) hours as needed. 02/15/18   Sherial Bail, MD  losartan  (COZAAR ) 50 MG tablet Take 1 tablet by mouth daily. 04/20/21   [provider]  mirtazapine  (REMERON ) 7.5 MG tablet Take 7.5 mg by mouth at bedtime. 12/21/21   [provider]  ondansetron  (ZOFRAN -ODT) 4 MG disintegrating tablet Take 1 tablet (4 mg total) by mouth every 8 (eight) hours as needed. 06/27/21   Claudene Rover, MD  polyethylene glycol powder (GLYCOLAX /MIRALAX ) 17 GM/SCOOP powder Take by mouth. 09/12/20   [provider]  predniSONE  (DELTASONE ) 5 MG tablet TAKE 1 TABLET (5 MG TOTAL) BY MOUTH DAILY. 07/08/23   Tamea Dedra CROME, MD  Propylene Glycol 0.6 % SOLN Place 1 drop into both eyes daily as needed (dryness).    [provider]  sertraline  (ZOLOFT ) 25 MG tablet Take 25 mg by mouth daily. 02/16/17   [provider]  TRELEGY ELLIPTA  200-62.5-25 MCG/ACT AEPB TAKE 1 PUFF BY MOUTH EVERY DAY 12/10/22   Tamea Dedra CROME, MD     Critical care time: 30 min

## 2023-10-19 NOTE — Progress Notes (Addendum)
 STROKE TEAM PROGRESS NOTE   SIGNIFICANT HOSPITAL EVENTS 7/9:  - Reported hand shaking events in the ICU overnight, Repeat CTH stable, routine EEG ordered, loaded with Keppra .  - Routine EEG suggestive of mild diffuse encephalopathy without seizures or epileptiform discharges.  - On exam this morning, patient complaining of severe 9/10 throbbing frontal headache with significant associated hypertension. Given a single dose of IV Fentanyl  for pain control.   INTERIM HISTORY/SUBJECTIVE Glaucoma shunt in right eye, okay for MRI per CCM team after discussion with MRI technician yesterday, MRI brain with and without contrast ordered to evaluate for possible intracranial lesion. Patient complaining of headache on exam this morning, throbbing, frontal, 9/10 in severity.  No further hand shaking movements appreciated on exam this morning.  OBJECTIVE CBC    Component Value Date/Time   WBC 10.5 10/19/2023 0558   RBC 2.73 (L) 10/19/2023 0558   HGB 7.8 (L) 10/19/2023 0558   HGB 9.3 (L) 05/20/2014 0457   HCT 25.5 (L) 10/19/2023 0558   HCT 29.8 (L) 05/20/2014 0457   PLT 449 (H) 10/19/2023 0558   PLT 401 05/20/2014 0457   MCV 93.4 10/19/2023 0558   MCV 82 05/20/2014 0457   MCH 28.6 10/19/2023 0558   MCHC 30.6 10/19/2023 0558   RDW 15.0 10/19/2023 0558   RDW 13.9 05/20/2014 0457   LYMPHSABS 0.4 (L) 02/09/2018 0552   LYMPHSABS 0.3 (L) 05/20/2014 0457   MONOABS 1.0 02/09/2018 0552   MONOABS 0.9 05/20/2014 0457   EOSABS 0.0 02/09/2018 0552   EOSABS 0.0 05/20/2014 0457   BASOSABS 0.0 02/09/2018 0552   BASOSABS 0.1 05/20/2014 0457   BMET    Component Value Date/Time   NA 141 10/19/2023 0558   NA 138 05/19/2014 0453   K 3.3 (L) 10/19/2023 0558   K 4.0 05/19/2014 0453   CL 103 10/19/2023 0558   CL 97 (L) 05/19/2014 0453   CO2 29 10/19/2023 0558   CO2 39 (H) 05/19/2014 0453   GLUCOSE 90 10/19/2023 0558   GLUCOSE 124 (H) 05/19/2014 0453   BUN 21 10/19/2023 0558   BUN 20 (H) 05/19/2014  0453   CREATININE 0.82 10/19/2023 0558   CREATININE 0.80 05/19/2014 0453   CALCIUM  8.7 (L) 10/19/2023 0558   CALCIUM  9.6 05/19/2014 0453   GFRNONAA >60 10/19/2023 0558   GFRNONAA >60 05/19/2014 0453   GFRNONAA >60 12/15/2013 1649   No results found for: HGBA1C  No results found for: CHOL, HDL, LDLCALC, LDLDIRECT, TRIG, CHOLHDL  Drugs of Abuse  No results found for: LABOPIA, COCAINSCRNUR, LABBENZ, AMPHETMU, THCU, LABBARB   Urinalysis    Component Value Date/Time   COLORURINE YELLOW (A) 10/17/2023 1600   APPEARANCEUR HAZY (A) 10/17/2023 1600   APPEARANCEUR Clear 12/15/2013 1649   LABSPEC 1.019 10/17/2023 1600   LABSPEC 1.010 12/15/2013 1649   PHURINE 5.0 10/17/2023 1600   GLUCOSEU NEGATIVE 10/17/2023 1600   GLUCOSEU Negative 12/15/2013 1649   HGBUR NEGATIVE 10/17/2023 1600   BILIRUBINUR NEGATIVE 10/17/2023 1600   BILIRUBINUR Negative 12/15/2013 1649   KETONESUR NEGATIVE 10/17/2023 1600   PROTEINUR 100 (A) 10/17/2023 1600   NITRITE NEGATIVE 10/17/2023 1600   LEUKOCYTESUR MODERATE (A) 10/17/2023 1600   LEUKOCYTESUR Trace 12/15/2013 1649   IMAGING past 24 hours CT HEAD WO CONTRAST ( ) Result Date: 10/19/2023 CLINICAL DATA:  Follow-up examination for intracranial hemorrhage. EXAM: CT HEAD WITHOUT CONTRAST TECHNIQUE: Contiguous axial images were obtained from the base of the skull through the vertex without intravenous contrast. RADIATION DOSE REDUCTION: This exam  was performed according to the departmental dose-optimization program which includes automated exposure control, adjustment of the mA and/or kV according to patient size and/or use of iterative reconstruction technique. COMPARISON:  CT from 10/18/2023 FINDINGS: Brain: Acute intraventricular hemorrhage involving both lateral ventricles again seen, not significantly changed in appearance of or volume since previous. Trace extension into the third ventricle noted. Associated ventriculomegaly, not  appreciably changed. No significant midline shift. No ventricular trapping. No new intracranial hemorrhage. No acute large vessel territory infarct. No mass lesion or midline shift. No extra-axial fluid collection. Chronic microvascular ischemic disease again noted. Vascular: No abnormal hyperdense vessel. Skull: Scalp soft tissues and calvarium demonstrate no new finding. Sinuses/Orbits: Globes and orbital soft tissues demonstrate no new finding. Paranasal sinuses are largely clear. No mastoid effusion. Other: None. IMPRESSION: 1. No significant interval change in acute intraventricular hemorrhage involving both lateral ventricles. Associated ventriculomegaly, not appreciably changed. No midline shift or ventricular trapping. 2. No other new acute intracranial abnormality. Electronically Signed   By: Morene Hoard M.D.   On: 10/19/2023 02:35   CT HEAD WO CONTRAST ( ) Result Date: 10/18/2023 EXAM: CT HEAD WITHOUT 10/18/2023 TECHNIQUE: CT of the head was performed without the administration of intravenous contrast. Automated exposure control, iterative reconstruction, and/or weight based adjustment of the mA/kV was utilized to reduce the radiation dose to as low as reasonably achievable. COMPARISON: CT head without contrast 10/17/2023. CLINICAL HISTORY: Follow up intraventricular hemorrhage. FINDINGS: BRAIN AND VENTRICLES: Bilateral intraventricular hemorrhage is stable in size since the prior exam. Ventricular enlargement is stable as well. Layering blood is present in the posterior horns bilaterally. Mild generalized atrophy and white matter disease is again noted. No acute infarcts are present. ORBITS: No acute abnormality. SINUSES AND MASTOIDS: No acute abnormality. SOFT TISSUES AND SKULL: No acute skull fracture. No acute soft tissue abnormality. Atherosclerotic calcifications are present within the cavernous internal carotid arteries bilaterally. No hyperdense vessel is present. Metallic fragment is  again noted in the left lobe. Morphine replacement is present. IMPRESSION: 1. Stable bilateral intraventricular hemorrhage with layering blood in the posterior horns bilaterally. 2. Stable ventricular enlargement. 3. Mild generalized atrophy and white matter disease. No acute infarcts. Electronically signed by: Lonni Necessary MD 10/18/2023 12:41 PM EDT RP Workstation: [id number]   Vitals:   10/19/23 0600 10/19/23 0630 10/19/23 0700 10/19/23 0731  BP: (!) 156/55 (!) 146/57 (!) 140/59   Pulse: 79 80 79   Resp: 20 (!) 21 20   Temp:    98.9 F (37.2 C)  TempSrc:    Oral  SpO2: 100% 100% 100%   Weight:       PHYSICAL EXAM General: Lethargic, briefly opens eyes to voice.  Appears uncomfortable with complaints of headache. Psych:  Mood and affect appropriate for situation, calm and cooperative with exam. CV: Regular rate and rhythm on monitor Respiratory:  Regular, unlabored respirations on room air.  No respiratory distress. GI: Abdomen soft and nontender  NEURO:  Mental Status: Asleep initially, drowsy though opens eyes briefly to voice.  She is able to state her name correctly and that she is in the hospital.  She initially states that she is 71 but quickly corrects to her current age of 89.  She is unable to state the current month or year.  She is unable to recall details leading to hospitalization.  She follows simple commands.  No aphasia or dysarthria.  Cranial Nerves:  II: Right surgical pupil with corneal opacity, right pupil 3 mm nonreactive to light.  Left pupil 4 mm and briskly reactive to light. III, IV, VI: Right ptosis, no gaze palsy.  Right hemianopia, minimal vision from the right eye at baseline.  V: Sensation is intact to light touch and symmetrical to face.  VII: Mild left facial asymmetry VIII: Hearing intact to voice. IX, X: Palate elevates symmetrically. Phonation is normal.  XI: Shoulder shrug 5/5. XII: Tongue protrudes midline Motor: Bilateral upper extremities  elevate antigravity without vertical drift.  Bilateral lower extremities elevate antigravity also without vertical drift though without full elevation.  She is able to sustain antigravity position of bilateral lower extremities with examiner passive elevation.  She does complain of some left thigh pain with left lower extremity elevation. Tone: is normal and bulk is normal Sensation: Intact to light touch bilaterally. Extinction absent to light touch to DSS.   Coordination: FTN intact bilaterally with some limitation of the right upper extremity due to PIV placement, HKS: no ataxia in BLE. Gait: Deferred  ASSESSMENT/PLAN Autumn [name] is a 78 y.o. female with history of HTN, HLD, COPD on home O2 PRN, pulmonary sarcoidosis, anemia, ovarian cancer, glaucoma, OA presenting from [facility] ED 7/7 with LLE weakness after being found on the floor by family on 7/6 with reports of several days of increasing confusion per family. CT imaging revealed IVH in both lateral ventricles with ventriculometry and was admitted to the ICU.   Bilateral IVH with mild hydrocephalus - etiology:  Unclear, work up pending   CT Head 7/7: Moderate volume of acute intraventricular hemorrhage.  Suspected mild hydrocephalus.  Approximately 3 mm metallic density in the anterior left globe at the anterior chamber, suspicious for foreign body.  Recommend correlation with direct inspection and ophthalmology consultation.  No facial fracture.  No evidence of acute fracture or trauma in the cervical spine. Repeat CT head 7/8: Stable bilateral intraventricular hemorrhage with layering blood in the posterior horns bilaterally.  Stable ventricular enlargement.  Mild generalized atrophy and white matter disease.  No acute infarcts. Repeat CT head 7/9: No significant interval change in acute intraventricular hemorrhage involving both lateral ventricles.  Associated ventriculomegaly, not appreciably changed.  No midline shift or ventricular  trapping.  No other acute intracranial abnormality. MRI brain with and without contrast pending (previous concern for right eye foreign body, after discussion with MRI technician, glaucoma shunt is compatible with MRI imaging and subsequent MRI scans ordered) Will also consider CTA head and neck later 2D Echo 08/17/23 LVEF 60-65% LDL pending HgbA1c pending  Per NSGY, no intervention indicated at this time.  VTE prophylaxis - SCDs due to IVH No antithrombotic prior to admission, now on No antithrombotic due to IVH Therapy recommendations:  Pending Disposition:  pending  Hand-shaking event 7/9 AM: fidgeting versus ? Seizure-like activity No patient history of seizures Routine EEG with mild diffuse encephalopathy without seizures or epileptiform discharges Loaded with Keppra  one-time dose Description felt to be most consistent with fidgeting per evaluating neurologist Will defer maintenance AED dosing for now unless further concern arises  Hypertension Home meds:  Norvasc , atenolol , losartan   Stable on the high end On home norvasc  10, atenolol  50 bid and losartan  50 PRN hydralazine  used once overnight Blood pressure elevation in association with pain this morning during exam, treated pain, PRN antihypertensives (hydralazine , labetalol ) for blood pressure management  Blood Pressure Goal: SBP less than 160   Hyperlipidemia Home meds:  atorvastatin  10 LDL pending, goal < 70 May consider restart lipitor on discharge  Other Stroke Risk Factors Advanced  age  Other Active Problems Anemia, improving  Baseline Hgb ~ 10 Hgb 6.4 > 7.8 > 8.7 > 7.9 > 7.8 > 8.2 UTI on Ceftriaxone  UA with WBC 21-50 TMAX 24 hours 99.4  WBC 15 > 11.6 > 13.5 > 12.9 > 10.5 > 11.8 CK elevation, improving 1826, 1151 > 683 Depression Continue home medications  COPD Arthritis Glaucoma Seen by Dr. 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 Pt was seen and examined.   No family is at the bedside. Pt is lethargic but briefly open eyes on voice, pt is alert, orientated to age, place, time. No aphasia, paucity of speech but answer questions appropriately. Following all simple commands. Able to name and repeat. No gaze palsy but right ptosis, left eye pupil 3mm, sluggish to light, right eye cloudy pupil 2.1mm, not reactive to light. 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 Seems to have mild left facial asymmetry. Tongue midline. Bilateral UEs 4/5, no drift. Bilaterally LEs 3/5, no drift. Sensation symmetrical bilaterally, b/l FTN intact although slow, gait not tested.   For detailed assessment and plan, please refer to above as I have made changes wherever appropriate.   Ary Cummins, MD PhD Stroke Neurology 10/19/2023 3:59 PM  This patient is critically ill due to IVH and hydrocephalus, hypertensive emergency, seizure like activity and at significant risk of neurological worsening, death form obstructive hydrocephalus, brain edema, encephalopathy, seizure and status epilepticus. This patient's care requires constant monitoring of vital signs, hemodynamics, respiratory and cardiac monitoring, review of multiple databases, neurological assessment, discussion with family, other specialists and medical decision making of high  complexity. I spent 30 minutes of neurocritical care time in the care of this patient.    To contact Stroke Continuity provider, please refer to [email or web address]. After hours, contact General Neurology

## 2023-10-19 NOTE — Progress Notes (Signed)
 0230- Pt stated she had a headache on the way to CT. Pt's right hand started tremoring. Pt still only alert and oriented to self. Elink notified.

## 2023-10-19 NOTE — Progress Notes (Signed)
 eLink Physician-Brief Progress Note Patient Name: Autumn [name] DOB: [birth year] MRN: [id number]   Date of Service  10/19/2023  HPI/Events of Note  CT scan result reviewed. Stable ICH. Patient reportedly with hand shaking.   eICU Interventions  Keppra  load, cEEG, Neurology consult.        Bettye Sitton U Jerral Mccauley 10/19/2023, 2:59 AM

## 2023-10-19 NOTE — Procedures (Signed)
 Patient Name: Autumn [name]  MRN: [id number]  Epilepsy Attending: Arlin MALVA Krebs  Referring Physician/Provider: Adele Song, MD  Date: 10/19/2023 Duration: 24.16 mins  Patient history: 79 y.o. female admitted with IVH, LLE weakness and encephalopathy. Early this AM, she was noted to have hand shaking movements. Repeat CT head was then obtained, which was stable. EEG to evaluate for seizure  Level of alertness: Awake, drowsy  AEDs during EEG study: LEV  Technical aspects: This EEG study was done with scalp electrodes positioned according to the 10-20 International system of electrode placement. Electrical activity was reviewed with band pass filter of 1-70Hz , sensitivity of 7 uV/mm, display speed of 59mm/sec with a 60Hz  notched filter applied as appropriate. EEG data were recorded continuously and digitally stored.  Video monitoring was available and reviewed as appropriate.  Description: The posterior dominant rhythm consists of 8-9 Hz activity of moderate voltage (25-35 uV) seen predominantly in posterior head regions, symmetric and reactive to eye opening and eye closing. Drowsiness was characterized by attenuation of the posterior background rhythm. EEG showed intermittent generalized 3 to 6 Hz theta-delta slowing. Hyperventilation and photic stimulation were not performed.     ABNORMALITY - Intermittent slow, generalized  IMPRESSION: This study is suggestive of mild diffuse encephalopathy. No seizures or epileptiform discharges were seen throughout the recording.   Dshawn Mcnay O Christa Fasig

## 2023-10-20 ENCOUNTER — Inpatient Hospital Stay (HOSPITAL_COMMUNITY)

## 2023-10-20 DIAGNOSIS — R569 Unspecified convulsions: Secondary | ICD-10-CM | POA: Diagnosis not present

## 2023-10-20 DIAGNOSIS — R9089 Other abnormal findings on diagnostic imaging of central nervous system: Secondary | ICD-10-CM

## 2023-10-20 DIAGNOSIS — G919 Hydrocephalus, unspecified: Secondary | ICD-10-CM | POA: Diagnosis not present

## 2023-10-20 DIAGNOSIS — G9340 Encephalopathy, unspecified: Secondary | ICD-10-CM | POA: Diagnosis not present

## 2023-10-20 DIAGNOSIS — I615 Nontraumatic intracerebral hemorrhage, intraventricular: Secondary | ICD-10-CM | POA: Diagnosis not present

## 2023-10-20 LAB — LIPID PANEL
Cholesterol: 168 mg/dL (ref 0–200)
HDL: 56 mg/dL (ref >40)
LDL Cholesterol: 97 mg/dL (ref 0–99)
Total CHOL/HDL Ratio: 3 ratio
Triglycerides: 73 mg/dL (ref <150)
VLDL: 15 mg/dL (ref 0–40)

## 2023-10-20 LAB — BASIC METABOLIC PANEL WITH GFR
Anion gap: 8 (ref 5–15)
BUN: 15 mg/dL (ref 8–23)
CO2: 32 mmol/L (ref 22–32)
Calcium: 9.2 mg/dL (ref 8.9–10.3)
Chloride: 101 mmol/L (ref 98–111)
Creatinine, Ser: 0.8 mg/dL (ref 0.44–1.00)
GFR, Estimated: >60 mL/min (ref >60)
Glucose, Bld: 87 mg/dL (ref 70–99)
Potassium: 4 mmol/L (ref 3.5–5.1)
Sodium: 141 mmol/L (ref 135–145)

## 2023-10-20 LAB — CBC
HCT: 27.1 % — ABNORMAL LOW (ref 36.0–46.0)
Hemoglobin: 8.5 g/dL — ABNORMAL LOW (ref 12.0–15.0)
MCH: 29.2 pg (ref 26.0–34.0)
MCHC: 31.4 g/dL (ref 30.0–36.0)
MCV: 93.1 fL (ref 80.0–100.0)
Platelets: 497 K/uL — ABNORMAL HIGH (ref 150–400)
RBC: 2.91 MIL/uL — ABNORMAL LOW (ref 3.87–5.11)
RDW: 14.7 % (ref 11.5–15.5)
WBC: 12.2 K/uL — ABNORMAL HIGH (ref 4.0–10.5)
nRBC: 0 % (ref 0.0–0.2)

## 2023-10-20 LAB — GLUCOSE, CAPILLARY
Glucose-Capillary: 100 mg/dL — ABNORMAL HIGH (ref 70–99)
Glucose-Capillary: 131 mg/dL — ABNORMAL HIGH (ref 70–99)
Glucose-Capillary: 83 mg/dL (ref 70–99)
Glucose-Capillary: 90 mg/dL (ref 70–99)
Glucose-Capillary: 97 mg/dL (ref 70–99)
Glucose-Capillary: 98 mg/dL (ref 70–99)

## 2023-10-20 LAB — HEMOGLOBIN A1C
Hgb A1c MFr Bld: 5.3 % (ref 4.8–5.6)
Mean Plasma Glucose: 105 mg/dL

## 2023-10-20 MED ORDER — LACTATED RINGERS IV SOLN: INTRAVENOUS | Status: DC

## 2023-10-20 MED ORDER — ENSURE PLUS HIGH PROTEIN PO LIQD
237.0000 mL | Freq: Two times a day (BID) | ORAL | Status: DC
  Administered 2023-10-20 – 2023-10-23 (×6): 237 mL via ORAL

## 2023-10-20 NOTE — NC FL2 (Signed)
 [place]  MEDICAID FL2 LEVEL OF CARE FORM     IDENTIFICATION  Patient Name: Autumn [name] Birthdate: March 21, 1946 Sex: female Admission Date (Current Location): 10/18/2023  [facility] and [place] Number:  Chiropodist and Address:  [facility] [place]. [facility], [address] [place], [place] [address]      Provider Number: [id number]  Attending Physician Name and Address:  Madelyne Owen LABOR, MD  Relative Name and Phone Number:  Shona Mulch; Daughter; [phone]    Current Level of Care: Hospital Recommended Level of Care: Skilled Nursing Facility Prior Approval Number:    Date Approved/Denied:   PASRR Number: [id number]  Discharge Plan: SNF    Current Diagnoses: Patient Active Problem List   Diagnosis Date Noted   Toxic metabolic encephalopathy 10/19/2023   Traumatic rhabdomyolysis (HCC) 10/19/2023   AKI (acute kidney injury) (HCC) 10/19/2023   Pressure injury of skin 10/19/2023   Intraventricular hemorrhage (HCC) 10/18/2023   Hyperlipidemia 05/05/2020   Hypertension 05/05/2020   OP (osteoporosis) 05/05/2020   Pain due to onychomycosis of toenails of both feet 05/05/2020   Lumbar facet arthropathy 11/06/2019   Neuropathic pain 12/14/2018   Chronic use of opiate for therapeutic purpose 12/14/2018   Chronic pain syndrome 12/14/2018   Thoracic radiculopathy 12/14/2018   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   Depression, prolonged 04/07/2018   Chronic respiratory failure (HCC)    Palliative care encounter    Protein-calorie malnutrition, severe 02/06/2018   Acute respiratory failure (HCC) 02/04/2018   COPD (chronic obstructive pulmonary disease) (HCC) 03/22/2017   Post herpetic neuralgia 07/30/2016   Hypercalcemia 02/17/2016   Chronic angle-closure glaucoma of both eyes, severe stage 01/20/2016   Age-related nuclear cataract of both eyes 12/17/2015   Pneumonia 11/17/2015   Long term current use of systemic steroids 02/14/2015   Pulmonary hypertension (HCC)  10/05/2013   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   Abnormal Pap smear of vagina 05/31/2012   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   Ovarian cancer (HCC) 12/11/2009   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    Orientation RESPIRATION BLADDER Height & Weight     Self  O2 (2L nasal cannula) Incontinent, External catheter Weight: 96 lb 9 oz (43.8 kg) Height:     BEHAVIORAL SYMPTOMS/MOOD NEUROLOGICAL BOWEL NUTRITION STATUS      Continent Diet (Please see discharge summary)  AMBULATORY STATUS COMMUNICATION OF NEEDS Skin   Extensive Assist Verbally PU Stage and Appropriate Care (Pressure Injury Vertebral column Lower Stage 2 - Partial thickness loss of dermis presenting as a shallow open injury with a red, pink wound bed without slough.)                       Personal Care Assistance Level of Assistance  Bathing, Dressing, Feeding Bathing Assistance: Maximum assistance Feeding assistance: Maximum assistance Dressing Assistance: Maximum assistance     Functional Limitations Info             SPECIAL CARE FACTORS FREQUENCY  PT (By licensed PT), OT (By licensed OT)     PT Frequency: 5x OT Frequency: 5x            Contractures Contractures Info: Not present    Additional Factors Info  Code Status, Allergies Code Status Info: Full Code Allergies Info: Ivp Dye (Iodinated Contrast Media)           Current Medications (10/20/2023):  This is the current hospital active medication list Current Facility-Administered Medications  Medication Dose Route Frequency Provider Last Rate Last Admin   acetaminophen  (TYLENOL ) tablet 650 mg  650 mg Oral Q4H PRN Ilah Corean HERO, PA-C   650 mg at 10/19/23 9761   albuterol  (PROVENTIL ) (2.5 MG/3ML) 0.083% nebulizer solution 2.5 mg  2.5 mg Nebulization Q4H PRN Ilah Corean M, PA-C       amLODipine  (NORVASC ) tablet 10 mg  10 mg Oral Daily Isadora Hose, MD   10 mg at 10/20/23 0941    arformoterol  (BROVANA ) nebulizer solution 15 mcg  15 mcg Nebulization BID Ilah Corean HERO, PA-C   15 mcg at 10/20/23 9188   And   revefenacin  (YUPELRI ) nebulizer solution 175 mcg  175 mcg Nebulization Daily Ilah Corean HERO, PA-C   175 mcg at 10/20/23 0811   And   budesonide  (PULMICORT ) nebulizer solution 0.25 mg  0.25 mg Nebulization BID Ilah Corean M, PA-C   0.25 mg at 10/20/23 9188   atenolol  (TENORMIN ) tablet 50 mg  50 mg Oral BID Jerri Pfeiffer, MD   50 mg at 10/20/23 0941   butalbital -acetaminophen -caffeine  (FIORICET ) 50-325-40 MG per tablet 1 tablet  1 tablet Oral Q8H PRN Jerri Pfeiffer, MD   1 tablet at 10/19/23 2350   cefTRIAXone  (ROCEPHIN ) 1 g in sodium chloride  0.9 % 100 mL IVPB  1 g Intravenous Q24 Hr x 2 Hindel, Leah, MD   Stopped at 10/19/23 1854   Chlorhexidine  Gluconate Cloth 2 % PADS 6 each  6 each Topical Daily Layman Raisin, DO   6 each at 10/20/23 0944   docusate sodium  (COLACE) capsule 100 mg  100 mg Oral BID PRN Ilah Corean HERO, PA-C       feeding supplement (ENSURE PLUS HIGH PROTEIN) liquid 237 mL  237 mL Oral BID BM Regalado, Belkys A, MD   237 mL at 10/20/23 0941   hydrALAZINE  (APRESOLINE ) injection 10 mg  10 mg Intravenous Q4H PRN Isadora Hose, MD   10 mg at 10/20/23 0334   insulin  aspart (novoLOG ) injection 0-9 Units  0-9 Units Subcutaneous Q4H Ilah Corean HERO, PA-C   1 Units at 10/19/23 1617   ipratropium-albuterol  (DUONEB) 0.5-2.5 (3) MG/3ML nebulizer solution 3 mL  3 mL Nebulization Q4H PRN Ilah Corean M, PA-C       labetalol  (NORMODYNE ) injection 20 mg  20 mg Intravenous Q2H PRN Isadora Hose, MD       lactated ringers  infusion   Intravenous Continuous Hindel, Rea, MD 75 mL/hr at 10/20/23 0936 New Bag at 10/20/23 0936   losartan  (COZAAR ) tablet 50 mg  50 mg Oral Daily Hindel, Leah, MD   50 mg at 10/20/23 0941   mirtazapine  (REMERON ) tablet 7.5 mg  7.5 mg Oral QHS Ilah Corean M, PA-C   7.5 mg at 10/19/23 2104   ondansetron  (ZOFRAN ) injection  4 mg  4 mg Intravenous Q6H PRN Ilah Corean HERO, PA-C       Oral care mouth rinse  15 mL Mouth Rinse PRN Layman Raisin, DO       polyethylene glycol (MIRALAX  / GLYCOLAX ) packet 17 g  17 g Oral Daily PRN Ilah Corean M, PA-C       predniSONE  (DELTASONE ) tablet 5 mg  5 mg Oral Daily Ilah Corean M, PA-C   5 mg at 10/20/23 0940   sertraline  (ZOLOFT ) tablet 25 mg  25 mg Oral Daily Ilah Corean M, PA-C   25 mg at 10/20/23 9058     Discharge Medications: Please see discharge summary for a list of discharge medications.  Relevant Imaging Results:  Relevant Lab Results:   Additional Information SSN: [id number]  Lauraine FORBES Saa, LCSW

## 2023-10-20 NOTE — Progress Notes (Signed)
 STROKE TEAM PROGRESS NOTE   SIGNIFICANT HOSPITAL EVENTS 7/9:  - Reported hand shaking events in the ICU overnight, Repeat CTH stable, routine EEG ordered, loaded with Keppra .  - Routine EEG suggestive of mild diffuse encephalopathy without seizures or epileptiform discharges.  - On exam this morning, patient complaining of severe 9/10 throbbing frontal headache with significant associated hypertension. Given a single dose of IV Fentanyl  for pain control.  - MRI brain w and wo contrast: Moderate volume acute IVH with associated hydrocephalus, relatively stable from prior.  Small focus of enhancement embedded within this hemorrhage felt to be consistent with a small vessel.    INTERIM HISTORY/SUBJECTIVE No family present at bedside today, patient denies current headache, blood pressure stable overnight.  PRN Fioricet  used for headache overnight per bedside RN, headache is worse and abrupt with position changes.  Asterixis appearing hand movements appreciated today bilaterally.  MRI brain with and without contrast completed overnight, as above.   After discussion with Dr. Onetha and neuroradiology, will allow IVH to absorb and pursue repeat MRI brain imaging in 4-5 weeks for further evaluation of possible underlying ventricular abnormality (possible mass versus AVM with fluid level abnormality seen within hemorrhage in neuroimaging). Appropriate for transfer out of ICU from neurologic standpoint  OBJECTIVE CBC    Component Value Date/Time   WBC 11.8 (H) 10/19/2023 1137   RBC 2.82 (L) 10/19/2023 1137   HGB 8.2 (L) 10/19/2023 1137   HGB 9.3 (L) 05/20/2014 0457   HCT 26.2 (L) 10/19/2023 1137   HCT 29.8 (L) 05/20/2014 0457   PLT 441 (H) 10/19/2023 1137   PLT 401 05/20/2014 0457   MCV 92.9 10/19/2023 1137   MCV 82 05/20/2014 0457   MCH 29.1 10/19/2023 1137   MCHC 31.3 10/19/2023 1137   RDW 14.7 10/19/2023 1137   RDW 13.9 05/20/2014 0457   LYMPHSABS 0.4 (L) 02/09/2018 0552   LYMPHSABS 0.3  (L) 05/20/2014 0457   MONOABS 1.0 02/09/2018 0552   MONOABS 0.9 05/20/2014 0457   EOSABS 0.0 02/09/2018 0552   EOSABS 0.0 05/20/2014 0457   BASOSABS 0.0 02/09/2018 0552   BASOSABS 0.1 05/20/2014 0457   BMET    Component Value Date/Time   NA 141 10/20/2023 0204   NA 138 05/19/2014 0453   K 4.0 10/20/2023 0204   K 4.0 05/19/2014 0453   CL 101 10/20/2023 0204   CL 97 (L) 05/19/2014 0453   CO2 32 10/20/2023 0204   CO2 39 (H) 05/19/2014 0453   GLUCOSE 87 10/20/2023 0204   GLUCOSE 124 (H) 05/19/2014 0453   BUN 15 10/20/2023 0204   BUN 20 (H) 05/19/2014 0453   CREATININE 0.80 10/20/2023 0204   CREATININE 0.80 05/19/2014 0453   CALCIUM  9.2 10/20/2023 0204   CALCIUM  9.6 05/19/2014 0453   GFRNONAA >60 10/20/2023 0204   GFRNONAA >60 05/19/2014 0453   GFRNONAA >60 12/15/2013 1649   No results found for: HGBA1C  Lab Results  Component Value Date   CHOL 168 10/20/2023   HDL 56 10/20/2023   LDLCALC 97 10/20/2023   TRIG 73 10/20/2023   CHOLHDL 3.0 10/20/2023    Drugs of Abuse  No results found for: LABOPIA, COCAINSCRNUR, LABBENZ, AMPHETMU, THCU, LABBARB   Urinalysis    Component Value Date/Time   COLORURINE YELLOW (A) 10/17/2023 1600   APPEARANCEUR HAZY (A) 10/17/2023 1600   APPEARANCEUR Clear 12/15/2013 1649   LABSPEC 1.019 10/17/2023 1600   LABSPEC 1.010 12/15/2013 1649   PHURINE 5.0 10/17/2023 1600   GLUCOSEU  NEGATIVE 10/17/2023 1600   GLUCOSEU Negative 12/15/2013 1649   HGBUR NEGATIVE 10/17/2023 1600   BILIRUBINUR NEGATIVE 10/17/2023 1600   BILIRUBINUR Negative 12/15/2013 1649   KETONESUR NEGATIVE 10/17/2023 1600   PROTEINUR 100 (A) 10/17/2023 1600   NITRITE NEGATIVE 10/17/2023 1600   LEUKOCYTESUR MODERATE (A) 10/17/2023 1600   LEUKOCYTESUR Trace 12/15/2013 1649   IMAGING past 24 hours MR BRAIN W WO CONTRAST Result Date: 10/19/2023 CLINICAL DATA:  Initial evaluation for acute neuro deficit, stroke suspected. EXAM: MRI HEAD WITHOUT AND WITH CONTRAST  TECHNIQUE: Multiplanar, multiecho pulse sequences of the brain and surrounding structures were obtained without and with intravenous contrast. CONTRAST:  4mL GADAVIST  GADOBUTROL  1 MMOL/ML IV SOLN COMPARISON:  CT from earlier the same day. FINDINGS: Brain: Mild age-related cerebral atrophy. Patchy and confluent T2/FLAIR hyperintensity involving the periventricular deep white matter both cerebral hemispheres, most characteristic of chronic microvascular ischemic disease, moderately advanced in nature. Patchy involvement of the pons noted. No evidence for acute or subacute infarct. Gray-white matter differentiation maintained. No areas of chronic cortical infarction. Moderate volume acute hemorrhage again seen within the lateral ventricles. Associated blood seen layering within the occipital horns bilaterally. Associated hydrocephalus, relatively stable from prior. Probable mild transependymal flow about the frontal and occipital horns of both lateral ventricles. Following contrast administration, small focus of enhancement within the central aspect of the hemorrhage is felt to be consistent with a small vessel (series 11, image 30). No other underlying mass lesion or other vascular abnormality. No other mass lesion, midline shift or mass effect. No extra-axial fluid collection. Pituitary gland suprasellar region within normal limits. No other abnormal enhancement. Vascular: Major intracranial vascular flow voids are maintained. Skull and upper cervical spine: Craniocervical junction within normal limits. Bone marrow signal intensity normal. No scalp soft tissue abnormality. Sinuses/Orbits: Prior ocular lens replacement with glaucoma shunt at the left globe. Globes and orbital soft tissues demonstrate no other acute finding. Paranasal sinuses are clear. No mastoid effusion. Other: None. IMPRESSION: 1. Moderate volume acute intraventricular hemorrhage with associated hydrocephalus, relatively stable from prior. Small  focus of enhancement imbedded within this hemorrhage felt to be consistent with a small vessel. No other underlying lesion or other vascular abnormality. 2. No other acute intracranial abnormality. 3. Underlying age-related cerebral atrophy with moderate chronic microvascular ischemic disease. Electronically Signed   By: Morene Hoard M.D.   On: 10/19/2023 20:51   EEG adult Result Date: 10/19/2023 Shelton Arlin KIDD, MD     10/19/2023 11:00 AM Patient Name: Autumn [name] MRN: [id number] Epilepsy Attending: Arlin KIDD Shelton Referring Physician/Provider: Adele Song, MD Date: 10/19/2023 Duration: 24.16 mins Patient history: 78 y.o. female admitted with IVH, LLE weakness and encephalopathy. Early this AM, she was noted to have hand shaking movements. Repeat CT head was then obtained, which was stable. EEG to evaluate for seizure Level of alertness: Awake, drowsy AEDs during EEG study: LEV Technical aspects: This EEG study was done with scalp electrodes positioned according to the 10-20 International system of electrode placement. Electrical activity was reviewed with band pass filter of 1-70Hz , sensitivity of 7 uV/mm, display speed of 9mm/sec with a 60Hz  notched filter applied as appropriate. EEG data were recorded continuously and digitally stored.  Video monitoring was available and reviewed as appropriate. Description: The posterior dominant rhythm consists of 8-9 Hz activity of moderate voltage (25-35 uV) seen predominantly in posterior head regions, symmetric and reactive to eye opening and eye closing. Drowsiness was characterized by attenuation of the posterior background rhythm. EEG  showed intermittent generalized 3 to 6 Hz theta-delta slowing. Hyperventilation and photic stimulation were not performed.   ABNORMALITY - Intermittent slow, generalized IMPRESSION: This study is suggestive of mild diffuse encephalopathy. No seizures or epileptiform discharges were seen throughout the recording. Priyanka MALVA Krebs   Vitals:   10/20/23 0630 10/20/23 0700 10/20/23 0718 10/20/23 0800  BP: (!) 143/65 133/84  129/61  Pulse: 84 85  86  Resp: (!) 21 (!) 23  (!) 22  Temp:   98.5 F (36.9 C)   TempSrc:   Oral   SpO2: 100% 100%  100%  Weight:       PHYSICAL EXAM General: Lethargic, briefly opens eyes to voice.  Restless movements/repositioning as she reports my bum hurts  Psych:  Mood and affect appropriate for situation, calm and cooperative with exam. CV: Regular rate and rhythm on monitor Respiratory:  Regular, unlabored respirations on 2L .  No respiratory distress. GI: Abdomen soft and nontender  NEURO:  Mental Status: Asleep initially, drowsy though opens eyes briefly to voice, appears slightly more drowsy than exam yesterday.  She is able to state her name correctly and that she is in the hospital but states that she does not know why she is here.  She states the year is 2024 and is unable to recall the month or her age.   She is unable to recall details leading to hospitalization.  She follows simple commands.  No aphasia or dysarthria.  Cranial Nerves:  II: Right pupil with corneal opacity, irregular, round and nonreactive- 3 mm.  Left pupil round, 4 mm, and reactive to light. Right eye blind, left eye right hemianopia III, IV, VI: Significant right ptosis, no gaze palsy.  Right hemianopia, minimal vision from the right eye at baseline. Left eye periorbital ecchymosis appreciated as well as right upper lid slight edema. V: Sensation is intact to light touch and symmetrical to face.  VII: Mild left facial asymmetry VIII: Hearing intact to voice. IX, X: Palate elevates symmetrically. Phonation is normal.  XI: Shoulder shrug 5/5. XII: Tongue protrudes midline Motor: Bilateral upper extremities elevate antigravity with right > left vertical drift though she is more drowsy today and requires coaching for sustained elevation, drift is felt to be related to level of alertness rather than true  weakness 4-/5 BUE.  Bilateral lower extremities move equally and spontaneously with brief antigravity movements 3/5 strength. No overt unilateral weakness appreciated in extremities throughout.  Bilateral hand asterixis tremor noted.  Tone: is normal and bulk is normal Sensation: Intact to light touch bilaterally. Extinction absent to light touch to DSS.   Coordination: FTN intact bilaterally with some limitation of the right upper extremity due to PIV placement, slowed pursuits Gait: Deferred  ASSESSMENT/PLAN Autumn [name] is a 78 y.o. female with history of HTN, HLD, COPD on home O2 PRN, pulmonary sarcoidosis, anemia, ovarian cancer, glaucoma, OA presenting from [facility] ED 7/7 with LLE weakness after being found on the floor by family on 7/6 with reports of several days of increasing confusion per family. CT imaging revealed IVH in both lateral ventricles with ventriculometry and was admitted to the ICU.   Bilateral IVH with mild hydrocephalus - etiology:  Unclear, ? 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  Suspected mild hydrocephalus.   Repeat CT head 7/8: Stable bilateral intraventricular hemorrhage with layering blood in the posterior horns bilaterally.  Stable ventricular enlargement.   Repeat CT head 7/9: No  significant interval change in acute intraventricular hemorrhage involving both lateral ventricles.  Associated ventriculomegaly, not appreciably changed.   MRI brain with and without contrast: Moderate volume acute IVH with associated hydrocephalus, relatively stable from prior.  Small focus of enhancement embedded within this hemorrhage felt to be consistent with a small vessel.  On my review, there is an atypical shape between b/l lateral ventricles, ? Ependymoma vs. AVM, less likely metastasis from history of ovarian cancer. Discussed with radiology and Dr. Onetha, recommend repeat MRI brain with and without contrast after  IVH absorption (4-5 weeks) for further evaluation. MRA head pending Carotid doppler pending 2D Echo 08/17/23 LVEF 60-65% LDL 97 HgbA1c pending  Per NSGY, no intervention indicated at this time.  VTE prophylaxis - SCDs due to IVH No antithrombotic prior to admission, now on No antithrombotic due to IVH Therapy recommendations:  CIR Disposition:  pending  Headache associated with IVH One time dose of Fentanyl  12.5 mcg given 10/19/23 Fioricet  PRN for short term management Headache is positional consistent with cerebral hemorrhage/irriation Now improved  Hand-shaking event 7/9 AM: fidgeting versus ? Seizure-like activity No patient history of seizures Routine EEG with mild diffuse encephalopathy without seizures or epileptiform discharges Loaded with Keppra  one-time dose Description felt to be most consistent with fidgeting per evaluating neurologist Will defer maintenance AED dosing for now unless further concern arises Negative myoclonus briefly appreciated on exam this morning 7/10 without further reported concern for seizure-like activity  Hypertension Home meds:  Norvasc , atenolol , losartan   Stable  On home norvasc  10, atenolol  50 bid and losartan  50 PRN hydralazine  used once overnight 7/8 and 7/9 Blood pressure elevation in association with pain 7/9 during exam, treated pain, PRN antihypertensives (hydralazine , labetalol ) for blood pressure management  Blood Pressure Goal: SBP less than 160   Hyperlipidemia Home meds:  atorvastatin  10 LDL 97, goal < 70 Increasing atorvastatin  to 20 mg PO for initiation at discharge  Other Stroke Risk Factors Advanced age  Other Active Problems Anemia, improving  Baseline Hgb ~ 10 Transfused 7/7 1 unit PRBC Hgb 6.4 > 7.8 > 8.7 > 7.9 > 7.8 > 8.2 UTI on Ceftriaxone  UA with WBC 21-50 TMAX 24 hours 99.1  WBC 15 > 11.6 > 13.5 > 12.9 > 10.5 > 11.8 CK elevation, improving 1826, 1151 > 683 Depression Continue home medications   COPD Arthritis Glaucoma Seen by Dr. 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 Pt was seen and examined.   No MD at bedside.  Patient still lethargic, however open eyes on voice, orientated x 3.  Still has right proptosis, left facial droop, right eye blind, left eye questionable right hemianopia, moving all extremities.  MRI with and without contrast done overnight shows stable IVH and mild hydrocephalus, however there is an atypical shape in between bilateral lateral ventricles, concerning for questionable ependymoma versus AVM.  Discussed with radiology and neurosurgery Dr. Onetha, recommend repeat MRI with and without contrast in 4 to 5 weeks after hemorrhage absorption.  Continue headache  management.  Will follow  For detailed assessment and plan, please refer to above as I have made changes wherever appropriate.   Ary Cummins, MD PhD Stroke Neurology 10/20/2023 6:42 PM    To contact Stroke Continuity provider, please refer to [email or web address]. After hours, contact General Neurology

## 2023-10-20 NOTE — Progress Notes (Signed)
 PROGRESS NOTE    Autumn [name]  FMW:[id number] DOB: August 10, 1945 DOA: 10/18/2023 PCP: Sherial Bail, MD   Brief Narrative: 78 year old with past medical history significant for pulmonary sarcoidosis, COPD on 2 L nasal cannula as needed, hypertension presented to [facility] with encephalopathy after being found down at home by family.  She was found to have IVH and was transferred to [facility] for further evaluation by neurosurgery.  Patient was evaluated by neurosurgery who recommended no intervention.  Patient was started on nicardipine  drip for hypertensive emergency.  She subsequently was transitioned to oral medications.  Patient developed right hand tremor, CT head was stable a spot EEG was negative.  She was started on Keppra  and neurology was consulted.  She was also found to have left sartorius left gluteus hematoma post fall.  Care transitioned to [facility]  on 7/10.   Assessment & Plan:   Principal Problem:   Intraventricular hemorrhage (HCC) Active Problems:   Toxic metabolic encephalopathy   Traumatic rhabdomyolysis (HCC)   AKI (acute kidney injury) (HCC)   Pressure injury of skin  1-Intraventricular hemorrhage, mild hydrocephalus Acute toxic metabolic encephalopathy -7/7: CT head: Moderate volume of intraventricular hemorrhage.  Mild hydrocephalus -7/09 CT head: No significant interval change in intraventricular hemorrhage involving both lateral ventricles.  Associated ventriculomegaly, no apparently change - MRI brain: Moderate volume of acute intraventricular hemorrhage with associated hydrocephalus, relatively stable from prior. -she is alert, follows command, generalized weak.    Hand Shaking:  -fidgeting vs seizure -EEG negative -She was loaded with keppra .  -Hold on AED for now No further movement noticed.   Hypertensive emergency - Treated with Nicardipine  gtt.  -She has been transition to: Norvasc , losartan , atenolol  Monitor BP  UTI: She was not treated  empirically with IV ceftriaxone .  History of pulmonary sarcoidosis Chronic prednisone  use -Continue 5 mg of prednisone  - Oxygen as needed - Continue Brovana , Yupelri  and Pulmicort   Rhabdomyolysis: Presented with a CK level of 11,000 Treated with IV fluids  Left lower extremity intramuscular hematoma CT abdomen pelvis:  Asymmetric expansion of the left sartorius and gluteus muscles with overlying subcutaneous soft tissue stranding, suspicious for intramuscular hematomas -Hemoglobin stable. Monitor.   Glaucoma: -CT noted 3 mm metallic density in the anterior left lobe at the anterior chamber, suspicious for foreign body. -Evaluated by Dr Octavia, the foreign body in question is consistent with shunt placement at [facility]. Patient has a history of trabeculectomy with Ex-Press shunt in the left eye in 2017   History of Ovarian Ca,  S/p total abdominal hysterectomy 1990s Ex-lap BSO, infragastric omentectomy, left pelvic and periaortic LN dissection, and peritoneal biopsies 01/07/2010 Estimated body mass index is 15.59 kg/m as calculated from the following:   Height as of 10/17/23: 5' 6 (1.676 m).   Weight as of this encounter: 43.8 kg.   DVT prophylaxis: SCD Code Status: Full code Family Communication:No family at bedside Disposition Plan:  Status is: Inpatient Remains inpatient appropriate because: management of IVH    Consultants:  Neurology Neurosurgery CCM  Procedures:    Antimicrobials:    Subjective: She is alert, she relates sleep fair last night. Report mild headaches this am. Pain is not worse.   Objective: Vitals:   10/20/23 0334 10/20/23 0400 10/20/23 0430 10/20/23 0500  BP: (!) 172/59 (!) 120/52 (!) 102/50 (!) 120/49  Pulse:  76 76 76  Resp:  (!) 23 17 (!) 23  Temp:      TempSrc:      SpO2:  100% 100%  100%  Weight:    43.8 kg    Intake/Output Summary (Last 24 hours) at 10/20/2023 0636 Last data filed at 10/20/2023 0500 Gross per 24 hour  Intake 1552.03  ml  Output 2250 ml  Net -697.97 ml   Filed Weights   10/18/23 0500 10/19/23 0448 10/20/23 0500  Weight: 48.8 kg 43.9 kg 43.8 kg    Examination:  General exam: Appears calm and comfortable  Respiratory system: Clear to auscultation. Respiratory effort normal. Cardiovascular system: S1 & S2 heard, RRR.  Gastrointestinal system: Abdomen is nondistended, soft and nontender. No organomegaly or masses felt. Normal bowel sounds heard. Central nervous system: Alert and oriented. Generalized weakness, follows some command Extremities: BL LE weakness    Data Reviewed: I have personally reviewed following labs and imaging studies  CBC: Recent Labs  Lab 10/18/23 0530 10/18/23 1017 10/18/23 1827 10/19/23 0558 10/19/23 1137  WBC 11.6* 13.5* 12.9* 10.5 11.8*  HGB 7.8* 8.7* 7.9* 7.8* 8.2*  HCT 24.1* 26.6* 24.6* 25.5* 26.2*  MCV 89.6 90.2 91.4 93.4 92.9  PLT 392 442* 424* 449* 441*   Basic Metabolic Panel: Recent Labs  Lab 10/17/23 1445 10/18/23 0530 10/18/23 1017 10/19/23 0558 10/20/23 0204  NA 141 141 142 141 141  K 3.4* 3.6 3.6 3.3* 4.0  CL 100 104 101 103 101  CO2 31 28 30 29  32  GLUCOSE 111* 118* 103* 90 87  BUN 43* 24* 26* 21 15  CREATININE 0.87 0.71 0.73 0.82 0.80  CALCIUM  9.2 8.6* 9.1 8.7* 9.2  MG  --  2.3  --   --   --   PHOS  --  3.0  --   --   --    GFR: Estimated Creatinine Clearance: 39.4 mL/min (by C-G formula based on SCr of 0.8 mg/dL). Liver Function Tests: Recent Labs  Lab 10/17/23 1445 10/18/23 0530  AST 58* 46*  ALT 23 25  ALKPHOS 31* 30*  BILITOT 0.9 1.3*  PROT 7.3 6.4*  ALBUMIN 3.8 3.3*   No results for input(s): LIPASE, AMYLASE in the last 168 hours. No results for input(s): AMMONIA in the last 168 hours. Coagulation Profile: Recent Labs  Lab 10/18/23 0530  INR 1.0   Cardiac Enzymes: Recent Labs  Lab 10/17/23 1445 10/18/23 1017 10/19/23 0558  CKTOTAL 1,826* 1,151* 683*   BNP (last 3 results) No results for input(s):  PROBNP in the last 8760 hours. HbA1C: No results for input(s): HGBA1C in the last 72 hours. CBG: Recent Labs  Lab 10/19/23 1140 10/19/23 1454 10/19/23 1924 10/19/23 2326 10/20/23 0329  GLUCAP 91 123* 115* 75 83   Lipid Profile: Recent Labs    10/20/23 0204  CHOL 168  HDL 56  LDLCALC 97  TRIG 73  CHOLHDL 3.0   Thyroid Function Tests: No results for input(s): TSH, T4TOTAL, FREET4, T3FREE, THYROIDAB in the last 72 hours. Anemia Panel: No results for input(s): VITAMINB12, FOLATE, FERRITIN, TIBC, IRON, RETICCTPCT in the last 72 hours. Sepsis Labs: No results for input(s): PROCALCITON, LATICACIDVEN in the last 168 hours.  Recent Results (from the past 240 hours)  MRSA Next Gen by PCR, Nasal     Status: None   Collection Time: 10/18/23  1:28 AM   Specimen: Nasal Mucosa; Nasal Swab  Result Value Ref Range Status   MRSA by PCR Next Gen NOT DETECTED NOT DETECTED Final    Comment: (NOTE) The GeneXpert MRSA Assay (FDA approved for NASAL specimens only), is one component of a comprehensive MRSA colonization surveillance  program. It is not intended to diagnose MRSA infection nor to guide or monitor treatment for MRSA infections. Test performance is not FDA approved in patients less than 66 years old. Performed at [facility], [address], [place], [place] [address]          Radiology Studies: MR BRAIN W WO CONTRAST Result Date: 10/19/2023 CLINICAL DATA:  Initial evaluation for acute neuro deficit, stroke suspected. EXAM: MRI HEAD WITHOUT AND WITH CONTRAST TECHNIQUE: Multiplanar, multiecho pulse sequences of the brain and surrounding structures were obtained without and with intravenous contrast. CONTRAST:  4mL GADAVIST  GADOBUTROL  1 MMOL/ML IV SOLN COMPARISON:  CT from earlier the same day. FINDINGS: Brain: Mild age-related cerebral atrophy. Patchy and confluent T2/FLAIR hyperintensity involving the periventricular deep white matter both  cerebral hemispheres, most characteristic of chronic microvascular ischemic disease, moderately advanced in nature. Patchy involvement of the pons noted. No evidence for acute or subacute infarct. Gray-white matter differentiation maintained. No areas of chronic cortical infarction. Moderate volume acute hemorrhage again seen within the lateral ventricles. Associated blood seen layering within the occipital horns bilaterally. Associated hydrocephalus, relatively stable from prior. Probable mild transependymal flow about the frontal and occipital horns of both lateral ventricles. Following contrast administration, small focus of enhancement within the central aspect of the hemorrhage is felt to be consistent with a small vessel (series 11, image 30). No other underlying mass lesion or other vascular abnormality. No other mass lesion, midline shift or mass effect. No extra-axial fluid collection. Pituitary gland suprasellar region within normal limits. No other abnormal enhancement. Vascular: Major intracranial vascular flow voids are maintained. Skull and upper cervical spine: Craniocervical junction within normal limits. Bone marrow signal intensity normal. No scalp soft tissue abnormality. Sinuses/Orbits: Prior ocular lens replacement with glaucoma shunt at the left globe. Globes and orbital soft tissues demonstrate no other acute finding. Paranasal sinuses are clear. No mastoid effusion. Other: None. IMPRESSION: 1. Moderate volume acute intraventricular hemorrhage with associated hydrocephalus, relatively stable from prior. Small focus of enhancement imbedded within this hemorrhage felt to be consistent with a small vessel. No other underlying lesion or other vascular abnormality. 2. No other acute intracranial abnormality. 3. Underlying age-related cerebral atrophy with moderate chronic microvascular ischemic disease. Electronically Signed   By: Morene Hoard M.D.   On: 10/19/2023 20:51   EEG  adult Result Date: 10/19/2023 Shelton Arlin KIDD, MD     10/19/2023 11:00 AM Patient Name: Autumn [name] MRN: [id number] Epilepsy Attending: Arlin KIDD Shelton Referring Physician/Provider: Adele Song, MD Date: 10/19/2023 Duration: 24.16 mins Patient history: 78 y.o. female admitted with IVH, LLE weakness and encephalopathy. Early this AM, she was noted to have hand shaking movements. Repeat CT head was then obtained, which was stable. EEG to evaluate for seizure Level of alertness: Awake, drowsy AEDs during EEG study: LEV Technical aspects: This EEG study was done with scalp electrodes positioned according to the 10-20 International system of electrode placement. Electrical activity was reviewed with band pass filter of 1-70Hz , sensitivity of 7 uV/mm, display speed of 40mm/sec with a 60Hz  notched filter applied as appropriate. EEG data were recorded continuously and digitally stored.  Video monitoring was available and reviewed as appropriate. Description: The posterior dominant rhythm consists of 8-9 Hz activity of moderate voltage (25-35 uV) seen predominantly in posterior head regions, symmetric and reactive to eye opening and eye closing. Drowsiness was characterized by attenuation of the posterior background rhythm. EEG showed intermittent generalized 3 to 6 Hz theta-delta slowing. Hyperventilation  and photic stimulation were not performed.   ABNORMALITY - Intermittent slow, generalized IMPRESSION: This study is suggestive of mild diffuse encephalopathy. No seizures or epileptiform discharges were seen throughout the recording. Priyanka MALVA Krebs   CT HEAD WO CONTRAST ( ) Result Date: 10/19/2023 CLINICAL DATA:  Follow-up examination for intracranial hemorrhage. EXAM: CT HEAD WITHOUT CONTRAST TECHNIQUE: Contiguous axial images were obtained from the base of the skull through the vertex without intravenous contrast. RADIATION DOSE REDUCTION: This exam was performed according to the departmental dose-optimization  program which includes automated exposure control, adjustment of the mA and/or kV according to patient size and/or use of iterative reconstruction technique. COMPARISON:  CT from 10/18/2023 FINDINGS: Brain: Acute intraventricular hemorrhage involving both lateral ventricles again seen, not significantly changed in appearance of or volume since previous. Trace extension into the third ventricle noted. Associated ventriculomegaly, not appreciably changed. No significant midline shift. No ventricular trapping. No new intracranial hemorrhage. No acute large vessel territory infarct. No mass lesion or midline shift. No extra-axial fluid collection. Chronic microvascular ischemic disease again noted. Vascular: No abnormal hyperdense vessel. Skull: Scalp soft tissues and calvarium demonstrate no new finding. Sinuses/Orbits: Globes and orbital soft tissues demonstrate no new finding. Paranasal sinuses are largely clear. No mastoid effusion. Other: None. IMPRESSION: 1. No significant interval change in acute intraventricular hemorrhage involving both lateral ventricles. Associated ventriculomegaly, not appreciably changed. No midline shift or ventricular trapping. 2. No other new acute intracranial abnormality. Electronically Signed   By: Morene Hoard M.D.   On: 10/19/2023 02:35   CT HEAD WO CONTRAST ( ) Result Date: 10/18/2023 EXAM: CT HEAD WITHOUT 10/18/2023 TECHNIQUE: CT of the head was performed without the administration of intravenous contrast. Automated exposure control, iterative reconstruction, and/or weight based adjustment of the mA/kV was utilized to reduce the radiation dose to as low as reasonably achievable. COMPARISON: CT head without contrast 10/17/2023. CLINICAL HISTORY: Follow up intraventricular hemorrhage. FINDINGS: BRAIN AND VENTRICLES: Bilateral intraventricular hemorrhage is stable in size since the prior exam. Ventricular enlargement is stable as well. Layering blood is present in the  posterior horns bilaterally. Mild generalized atrophy and white matter disease is again noted. No acute infarcts are present. ORBITS: No acute abnormality. SINUSES AND MASTOIDS: No acute abnormality. SOFT TISSUES AND SKULL: No acute skull fracture. No acute soft tissue abnormality. Atherosclerotic calcifications are present within the cavernous internal carotid arteries bilaterally. No hyperdense vessel is present. Metallic fragment is again noted in the left lobe. Morphine replacement is present. IMPRESSION: 1. Stable bilateral intraventricular hemorrhage with layering blood in the posterior horns bilaterally. 2. Stable ventricular enlargement. 3. Mild generalized atrophy and white matter disease. No acute infarcts. Electronically signed by: Lonni Necessary MD 10/18/2023 12:41 PM EDT RP Workstation: [id number]        Scheduled Meds:  amLODipine   10 mg Oral Daily   arformoterol   15 mcg Nebulization BID   And   revefenacin   175 mcg Nebulization Daily   And   budesonide  (PULMICORT ) nebulizer solution  0.25 mg Nebulization BID   atenolol   50 mg Oral BID   Chlorhexidine  Gluconate Cloth  6 each Topical Daily   insulin  aspart  0-9 Units Subcutaneous Q4H   losartan   50 mg Oral Daily   mirtazapine   7.5 mg Oral QHS   predniSONE   5 mg Oral Daily   sertraline   25 mg Oral Daily   Continuous Infusions:  cefTRIAXone  (ROCEPHIN )  IV Stopped (10/19/23 1854)   lactated ringers  75 mL/hr at 10/20/23 0500  LOS: 2 days    Time spent: 35 minutes   Kutler Vanvranken A Jaziel Bennett, MD [facility]   If 7PM-7AM, please contact night-coverage [email or web address]  10/20/2023, 6:36 AM

## 2023-10-20 NOTE — TOC Initial Note (Signed)
 Transition of Care [facility]) - Initial/Assessment Note    Patient Details  Name: Autumn [name] MRN: [id number] Date of Birth: [birth year]  Transition [facility] CM/SW Contact:    Lauraine FORBES Saa, LCSW Phone Number: 10/20/2023, 11:13 AM  Clinical Narrative:                  11:13 AM CSW introduced self and role to patient's daughter, Autumn (patient is not currently fully oriented). CSW informed Autumn of physical therapy's recommendation of patient discharging to SNF. Autumn was agreeable with recommendation and consented CSW to send referrals to SNFs within [facility].   Expected Discharge Plan: Skilled Nursing Facility Barriers to Discharge: Continued Medical Work up, English as a second language teacher, SNF Pending bed offer   Patient Goals and CMS Choice            Expected Discharge Plan and Services In-house Referral: Clinical Social Work   Post Acute Care Choice: Skilled Nursing Facility Living arrangements for the past 2 months: Single Family Home                                      Prior Living Arrangements/Services Living arrangements for the past 2 months: Single Family Home Lives with:: Self Patient language and need for interpreter reviewed:: Yes        Need for Family Participation in Patient Care: Yes (Comment)     Criminal Activity/Legal Involvement Pertinent to Current Situation/Hospitalization: No - Comment as needed  Activities of Daily Living      Permission Sought/Granted Permission sought to share information with : Family Supports, Oceanographer granted to share information with : No (Contact information on chart)  Share Information with NAME: Autumn [name]  Permission granted to share info w AGENCY: SNF  Permission granted to share info w Relationship: Daughter  Permission granted to share info w Contact Information: [phone]  Emotional Assessment Appearance:: Appears stated  age Attitude/Demeanor/Rapport: Unable to Assess Affect (typically observed): Unable to Assess Orientation: : Oriented to Self Alcohol / Substance Use: Not Applicable Psych Involvement: No (comment)  Admission diagnosis:  Intraventricular hemorrhage (HCC) [I61.5] Patient Active Problem List   Diagnosis Date Noted   Toxic metabolic encephalopathy 10/19/2023   Traumatic rhabdomyolysis (HCC) 10/19/2023   AKI (acute kidney injury) (HCC) 10/19/2023   Pressure injury of skin 10/19/2023   Intraventricular hemorrhage (HCC) 10/18/2023   Hyperlipidemia 05/05/2020   Hypertension 05/05/2020   OP (osteoporosis) 05/05/2020   Pain due to onychomycosis of toenails of both feet 05/05/2020   Lumbar facet arthropathy 11/06/2019   Neuropathic pain 12/14/2018   Chronic use of opiate for therapeutic purpose 12/14/2018   Chronic pain syndrome 12/14/2018   Thoracic radiculopathy 12/14/2018   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   Depression, prolonged 04/07/2018   Chronic respiratory failure (HCC)    Palliative care encounter    Protein-calorie malnutrition, severe 02/06/2018   Acute respiratory failure (HCC) 02/04/2018   COPD (chronic obstructive pulmonary disease) (HCC) 03/22/2017   Post herpetic neuralgia 07/30/2016   Hypercalcemia 02/17/2016   Chronic angle-closure glaucoma of both eyes, severe stage 01/20/2016   Age-related nuclear cataract of both eyes 12/17/2015   Pneumonia 11/17/2015   Long term current use of systemic steroids 02/14/2015   Pulmonary hypertension (HCC) 10/05/2013   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 [place] [address] Phone: [phone] Fax: [phone]  [id number] [place], [place] - [address] AT [facility] OF [address] [facility] [address] [place] [address] Phone: [phone] Fax: [phone]  [id number] - [address], [place] - [address] [place] [place] [place]. [place] [address] Phone: [phone] Fax: [phone]  [id number] - [place], [place] - [address] AT [facility] [address] [place] [address] Phone: [phone] Fax: [phone]  [id number], [place] - [address] [facility] [address] AT [facility] OF [address] [facility] [place] [address] Phone: [phone] Fax: [phone]     Social Drivers of Health (SDOH) Social History: SDOH Screenings   Food Insecurity: No Food Insecurity (06/30/2023)   Received from [facility]  Housing: Low Risk  (06/30/2023)   Received from [facility]  Transportation Needs: No Transportation Needs (06/30/2023)   Received from [facility]  Utilities: Not At Risk (06/30/2023)   Received from [facility]  Depression [phone]): Low Risk  (10/10/2023)  Financial Resource Strain: Low Risk  (06/30/2023)   Received from [facility]  Tobacco Use: Medium Risk (10/17/2023)   SDOH Interventions:     Readmission Risk Interventions     No data to display

## 2023-10-21 ENCOUNTER — Inpatient Hospital Stay (HOSPITAL_COMMUNITY)

## 2023-10-21 DIAGNOSIS — G919 Hydrocephalus, unspecified: Secondary | ICD-10-CM | POA: Diagnosis not present

## 2023-10-21 DIAGNOSIS — I615 Nontraumatic intracerebral hemorrhage, intraventricular: Secondary | ICD-10-CM | POA: Diagnosis not present

## 2023-10-21 DIAGNOSIS — R9089 Other abnormal findings on diagnostic imaging of central nervous system: Secondary | ICD-10-CM | POA: Diagnosis not present

## 2023-10-21 DIAGNOSIS — G9340 Encephalopathy, unspecified: Secondary | ICD-10-CM | POA: Diagnosis not present

## 2023-10-21 LAB — CBC
HCT: 23 % — ABNORMAL LOW (ref 36.0–46.0)
HCT: 26.8 % — ABNORMAL LOW (ref 36.0–46.0)
Hemoglobin: 7.2 g/dL — ABNORMAL LOW (ref 12.0–15.0)
Hemoglobin: 8.4 g/dL — ABNORMAL LOW (ref 12.0–15.0)
MCH: 29.1 pg (ref 26.0–34.0)
MCH: 29.2 pg (ref 26.0–34.0)
MCHC: 31.3 g/dL (ref 30.0–36.0)
MCHC: 31.3 g/dL (ref 30.0–36.0)
MCV: 93.1 fL (ref 80.0–100.0)
MCV: 93.1 fL (ref 80.0–100.0)
Platelets: 444 K/uL — ABNORMAL HIGH (ref 150–400)
Platelets: 530 K/uL — ABNORMAL HIGH (ref 150–400)
RBC: 2.47 MIL/uL — ABNORMAL LOW (ref 3.87–5.11)
RBC: 2.88 MIL/uL — ABNORMAL LOW (ref 3.87–5.11)
RDW: 14.8 % (ref 11.5–15.5)
RDW: 15 % (ref 11.5–15.5)
WBC: 10.6 K/uL — ABNORMAL HIGH (ref 4.0–10.5)
WBC: 12.5 K/uL — ABNORMAL HIGH (ref 4.0–10.5)
nRBC: 0 % (ref 0.0–0.2)
nRBC: 0 % (ref 0.0–0.2)

## 2023-10-21 LAB — GLUCOSE, CAPILLARY
Glucose-Capillary: 83 mg/dL (ref 70–99)
Glucose-Capillary: 95 mg/dL (ref 70–99)
Glucose-Capillary: 83 mg/dL (ref 70–99)
Glucose-Capillary: 103 mg/dL — ABNORMAL HIGH (ref 70–99)
Glucose-Capillary: 130 mg/dL — ABNORMAL HIGH (ref 70–99)
Glucose-Capillary: 122 mg/dL — ABNORMAL HIGH (ref 70–99)
Glucose-Capillary: 133 mg/dL — ABNORMAL HIGH (ref 70–99)

## 2023-10-21 LAB — BASIC METABOLIC PANEL WITH GFR
Anion gap: 11 (ref 5–15)
BUN: 18 mg/dL (ref 8–23)
CO2: 30 mmol/L (ref 22–32)
Calcium: 8.8 mg/dL — ABNORMAL LOW (ref 8.9–10.3)
Chloride: 100 mmol/L (ref 98–111)
Creatinine, Ser: 0.73 mg/dL (ref 0.44–1.00)
GFR, Estimated: >60 mL/min (ref >60)
Glucose, Bld: 90 mg/dL (ref 70–99)
Potassium: 3.8 mmol/L (ref 3.5–5.1)
Sodium: 141 mmol/L (ref 135–145)

## 2023-10-21 MED ORDER — ORAL CARE MOUTH RINSE: 15.0000 mL | OROMUCOSAL | Status: DC | PRN

## 2023-10-21 MED ORDER — NYSTATIN 100000 UNIT/ML MT SUSP
5.0000 mL | Freq: Four times a day (QID) | OROMUCOSAL | Status: DC
  Administered 2023-10-21 – 2023-10-27 (×18): 500000 [IU] via ORAL
  Filled 2023-10-21 (×20): qty 5

## 2023-10-21 MED ORDER — HEPARIN SODIUM (PORCINE) 5000 UNIT/ML IJ SOLN: 5000.0000 [IU] | Freq: Three times a day (TID) | INTRAMUSCULAR | Status: DC

## 2023-10-21 MED ORDER — ADULT MULTIVITAMIN W/MINERALS CH
1.0000 | ORAL_TABLET | Freq: Every day | ORAL | Status: DC
  Administered 2023-10-21 – 2023-10-27 (×5): 1 via ORAL
  Filled 2023-10-21 (×6): qty 1

## 2023-10-21 MED ORDER — POLYETHYLENE GLYCOL 3350 17 G PO PACK
17.0000 g | PACK | Freq: Every day | ORAL | Status: DC
  Administered 2023-10-22 – 2023-10-27 (×4): 17 g via ORAL
  Filled 2023-10-21 (×5): qty 1

## 2023-10-21 MED ORDER — SENNA 8.6 MG PO TABS
1.0000 | ORAL_TABLET | Freq: Every day | ORAL | Status: DC
  Administered 2023-10-21 – 2023-10-27 (×5): 8.6 mg via ORAL
  Filled 2023-10-21 (×6): qty 1

## 2023-10-21 MED ORDER — HYDROCODONE-ACETAMINOPHEN 5-325 MG PO TABS
1.0000 | ORAL_TABLET | Freq: Three times a day (TID) | ORAL | Status: DC | PRN
  Administered 2023-10-21 – 2023-10-27 (×3): 1 via ORAL
  Filled 2023-10-21 (×3): qty 1

## 2023-10-21 NOTE — Progress Notes (Addendum)
 PROGRESS NOTE    Autumn [name]  FMW:[id number] DOB: [birth year] DOA: 10/18/2023 PCP: Sherial Bail, MD   Brief Narrative: 78 year old with past medical history significant for pulmonary sarcoidosis, COPD on 2 L nasal cannula as needed, hypertension presented to [facility] with encephalopathy after being found down at home by family.  She was found to have IVH and was transferred to [facility] for further evaluation by neurosurgery.  Patient was evaluated by neurosurgery who recommended no intervention.  Patient was started on nicardipine  drip for hypertensive emergency.  She subsequently was transitioned to oral medications.  Patient developed right hand tremor, CT head was stable a spot EEG was negative.  She was started on Keppra  and neurology was consulted.  She was also found to have left sartorius left gluteus hematoma post fall.  Care transitioned to [facility]  on 7/10.   Assessment & Plan:   Principal Problem:   Intraventricular hemorrhage (HCC) Active Problems:   Toxic metabolic encephalopathy   Traumatic rhabdomyolysis (HCC)   AKI (acute kidney injury) (HCC)   Pressure injury of skin  1-Intraventricular hemorrhage, mild hydrocephalus Acute toxic metabolic encephalopathy -7/7: CT head: Moderate volume of intraventricular hemorrhage.  Mild hydrocephalus -7/09 CT head: No significant interval change in intraventricular hemorrhage involving both lateral ventricles.  Associated ventriculomegaly, no apparently change - MRI brain: Moderate volume of acute intraventricular hemorrhage with associated hydrocephalus, relatively stable from prior. 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 -MRA angio head: Normal intracranial MRA. No large vessel occlusion, hemodynamically significant stenosis, or other vascular abnormality. No aneurysm. Acute IVH with associated hydrocephalus, not appreciably  changed from prior. -Remain stable, answer questions, confuse.  -No antithrombotic due to IVH.  -Carotid doppler: Pending.    Hand Shaking:  -Fidgeting vs seizure -EEG negative -She was loaded with keppra .  -Hold on AED for now No further movement noticed.   Hypertensive emergency - Treated with Nicardipine  gtt.  -She has been transition to: Norvasc , losartan , atenolol  Monitor BP  UTI: She was not treated empirically with IV ceftriaxone .  History of pulmonary sarcoidosis Chronic prednisone  use -Continue 5 mg of prednisone  - Oxygen as needed - Continue Brovana , Yupelri  and Pulmicort   Rhabdomyolysis: Presented with a CK level of 11,000 Treated with IV fluids  Left lower extremity intramuscular hematoma CT abdomen pelvis:  Asymmetric expansion of the left sartorius and gluteus muscles with overlying subcutaneous soft tissue stranding, suspicious for intramuscular hematomas -Hemoglobin down to 7, monitor later. Hold DVT prophylaxis for now  Glaucoma: -CT noted 3 mm metallic density in the anterior left lobe at the anterior chamber, suspicious for foreign body. -Evaluated by Dr Octavia, the foreign body in question is consistent with shunt placement at [facility]. Patient has a history of trabeculectomy with Ex-Press shunt in the left eye in 2017   History of Ovarian Ca,  S/p total abdominal hysterectomy 1990s Ex-lap BSO, infragastric omentectomy, left pelvic and periaortic LN dissection, and peritoneal biopsies 01/07/2010 Estimated body mass index is 15.59 kg/m as calculated from the following:   Height as of 10/17/23: 5' 6 (1.676 m).   Weight as of this encounter: 43.8 kg.   DVT prophylaxis: SCD Code Status: Full code Family Communication: Daughter over phone.  Disposition Plan:  Status is: Inpatient Remains inpatient appropriate because: management of IVH    Consultants:  Neurology Neurosurgery CCM  Procedures:    Antimicrobials:    Subjective: She is alert,  and conversant,  answer some questions.   Objective: Vitals:   10/21/23 0738 10/21/23 0913 10/21/23 1031 10/21/23 1111  BP:  135/84 126/66   Pulse:   84   Resp:      Temp: 98.9 F (37.2 C)   98.9 F (37.2 C)  TempSrc: Axillary   Oral  SpO2:      Weight:        Intake/Output Summary (Last 24 hours) at 10/21/2023 1117 Last data filed at 10/21/2023 0600 Gross per 24 hour  Intake 1034.56 ml  Output 900 ml  Net 134.56 ml   Filed Weights   10/19/23 0448 10/20/23 0500 10/21/23 0330  Weight: 43.9 kg 43.8 kg 43.8 kg    Examination:  General exam: NAD Respiratory system: CTA Cardiovascular system: S 1, S 2 RRR Gastrointestinal system: BS present, NT Central nervous system: alert, follows command Extremities: BL LE weakness    Data Reviewed: I have personally reviewed following labs and imaging studies  CBC: Recent Labs  Lab 10/18/23 1827 10/19/23 0558 10/19/23 1137 10/20/23 1104 10/21/23 0844  WBC 12.9* 10.5 11.8* 12.2* 10.6*  HGB 7.9* 7.8* 8.2* 8.5* 7.2*  HCT 24.6* 25.5* 26.2* 27.1* 23.0*  MCV 91.4 93.4 92.9 93.1 93.1  PLT 424* 449* 441* 497* 444*   Basic Metabolic Panel: Recent Labs  Lab 10/18/23 0530 10/18/23 1017 10/19/23 0558 10/20/23 0204 10/21/23 0544  NA 141 142 141 141 141  K 3.6 3.6 3.3* 4.0 3.8  CL 104 101 103 101 100  CO2 28 30 29  32 30  GLUCOSE 118* 103* 90 87 90  BUN 24* 26* 21 15 18   CREATININE 0.71 0.73 0.82 0.80 0.73  CALCIUM  8.6* 9.1 8.7* 9.2 8.8*  MG 2.3  --   --   --   --   PHOS 3.0  --   --   --   --    GFR: Estimated Creatinine Clearance: 39.4 mL/min (by C-G formula based on SCr of 0.73 mg/dL). Liver Function Tests: Recent Labs  Lab 10/17/23 1445 10/18/23 0530  AST 58* 46*  ALT 23 25  ALKPHOS 31* 30*  BILITOT 0.9 1.3*  PROT 7.3 6.4*  ALBUMIN 3.8 3.3*   No results for input(s): LIPASE, AMYLASE in the last 168 hours. No results for input(s): AMMONIA in the last 168 hours. Coagulation Profile: Recent Labs  Lab  10/18/23 0530  INR 1.0   Cardiac Enzymes: Recent Labs  Lab 10/17/23 1445 10/18/23 1017 10/19/23 0558  CKTOTAL 1,826* 1,151* 683*   BNP (last 3 results) No results for input(s): PROBNP in the last 8760 hours. HbA1C: Recent Labs    10/20/23 0204  HGBA1C 5.3   CBG: Recent Labs  Lab 10/20/23 1920 10/20/23 2346 10/21/23 0316 10/21/23 0737 10/21/23 1110  GLUCAP 97 90 83 95 83   Lipid Profile: Recent Labs    10/20/23 0204  CHOL 168  HDL 56  LDLCALC 97  TRIG 73  CHOLHDL 3.0   Thyroid Function Tests: No results for input(s): TSH, T4TOTAL, FREET4, T3FREE, THYROIDAB in the last 72 hours. Anemia Panel: No results for input(s): VITAMINB12, FOLATE, FERRITIN, TIBC, IRON, RETICCTPCT in the last 72 hours. Sepsis Labs: No results for input(s): PROCALCITON, LATICACIDVEN in the last 168 hours.  Recent Results (from the past 240 hours)  MRSA Next Gen by PCR, Nasal     Status: None   Collection Time: 10/18/23  1:28 AM   Specimen: Nasal Mucosa; Nasal Swab  Result Value Ref Range Status   MRSA by PCR  Next Gen NOT DETECTED NOT DETECTED Final    Comment: (NOTE) The GeneXpert MRSA Assay (FDA approved for NASAL specimens only), is one component of a comprehensive MRSA colonization surveillance program. It is not intended to diagnose MRSA infection nor to guide or monitor treatment for MRSA infections. Test performance is not FDA approved in patients less than 9 years old. Performed at [facility], [address] [place], [place] [address]          Radiology Studies: MR ANGIO HEAD WO CONTRAST Result Date: 10/21/2023 CLINICAL DATA:  Follow-up examination for hemorrhagic stroke. EXAM: MRA HEAD WITHOUT CONTRAST TECHNIQUE: Angiographic images of the Circle of Willis were acquired using MRA technique without intravenous contrast. COMPARISON:  Prior MRI from 10/19/2023 FINDINGS: Anterior circulation: Both internal carotid arteries are patent  through the siphons without hemodynamically significant stenosis or other abnormality. A1 segments patent bilaterally. Normal anterior communicating artery complex. Anterior cerebral arteries patent without significant stenosis. No M1 stenosis or occlusion. Distal MCA branches perfused and symmetric. No beading or irregularity. Posterior circulation: Visualized distal V4 segments patent without stenosis. Neither PICA origin visualized. Basilar patent without stenosis. Superior cerebellar and posterior cerebral arteries widely patent bilaterally. No beading or irregularity. Anatomic variants: None significant. Other: No intracranial aneurysm or vascular malformation. Previously identified acute IVH with associated hydrocephalus noted, grossly similar to prior. IMPRESSION: 1. Normal intracranial MRA. No large vessel occlusion, hemodynamically significant stenosis, or other vascular abnormality. No aneurysm. 2. Acute IVH with associated hydrocephalus, not appreciably changed from prior. Electronically Signed   By: Morene Hoard M.D.   On: 10/21/2023 05:56   MR BRAIN W WO CONTRAST Result Date: 10/19/2023 CLINICAL DATA:  Initial evaluation for acute neuro deficit, stroke suspected. EXAM: MRI HEAD WITHOUT AND WITH CONTRAST TECHNIQUE: Multiplanar, multiecho pulse sequences of the brain and surrounding structures were obtained without and with intravenous contrast. CONTRAST:  4mL GADAVIST  GADOBUTROL  1 MMOL/ML IV SOLN COMPARISON:  CT from earlier the same day. FINDINGS: Brain: Mild age-related cerebral atrophy. Patchy and confluent T2/FLAIR hyperintensity involving the periventricular deep white matter both cerebral hemispheres, most characteristic of chronic microvascular ischemic disease, moderately advanced in nature. Patchy involvement of the pons noted. No evidence for acute or subacute infarct. Gray-white matter differentiation maintained. No areas of chronic cortical infarction. Moderate volume acute  hemorrhage again seen within the lateral ventricles. Associated blood seen layering within the occipital horns bilaterally. Associated hydrocephalus, relatively stable from prior. Probable mild transependymal flow about the frontal and occipital horns of both lateral ventricles. Following contrast administration, small focus of enhancement within the central aspect of the hemorrhage is felt to be consistent with a small vessel (series 11, image 30). No other underlying mass lesion or other vascular abnormality. No other mass lesion, midline shift or mass effect. No extra-axial fluid collection. Pituitary gland suprasellar region within normal limits. No other abnormal enhancement. Vascular: Major intracranial vascular flow voids are maintained. Skull and upper cervical spine: Craniocervical junction within normal limits. Bone marrow signal intensity normal. No scalp soft tissue abnormality. Sinuses/Orbits: Prior ocular lens replacement with glaucoma shunt at the left globe. Globes and orbital soft tissues demonstrate no other acute finding. Paranasal sinuses are clear. No mastoid effusion. Other: None. IMPRESSION: 1. Moderate volume acute intraventricular hemorrhage with associated hydrocephalus, relatively stable from prior. Small focus of enhancement imbedded within this hemorrhage felt to be consistent with a small vessel. No other underlying lesion or other vascular abnormality. 2. No other acute intracranial abnormality. 3. Underlying age-related cerebral  atrophy with moderate chronic microvascular ischemic disease. Electronically Signed   By: Morene Hoard M.D.   On: 10/19/2023 20:51        Scheduled Meds:  amLODipine   10 mg Oral Daily   arformoterol   15 mcg Nebulization BID   And   revefenacin   175 mcg Nebulization Daily   And   budesonide  (PULMICORT ) nebulizer solution  0.25 mg Nebulization BID   atenolol   50 mg Oral BID   Chlorhexidine  Gluconate Cloth  6 each Topical Daily   feeding  supplement  237 mL Oral BID BM   insulin  aspart  0-9 Units Subcutaneous Q4H   losartan   50 mg Oral Daily   mirtazapine   7.5 mg Oral QHS   predniSONE   5 mg Oral Daily   sertraline   25 mg Oral Daily   Continuous Infusions:  lactated ringers  75 mL/hr at 10/21/23 0600     LOS: 3 days    Time spent: 35 minutes   Wretha Laris A Jovanni Eckhart, MD [facility]   If 7PM-7AM, please contact night-coverage [email or web address]  10/21/2023, 11:17 AM

## 2023-10-21 NOTE — Evaluation (Signed)
 Clinical/Bedside Swallow Evaluation Patient Details  Name: Autumn [name] MRN: [id number] Date of Birth: 1945-06-05  Today's Date: 10/21/2023 Time: SLP Start Time (ACUTE ONLY): 1251 SLP Stop Time (ACUTE ONLY): 1315 SLP Time Calculation (min) (ACUTE ONLY): 24 min  Past Medical History:  Past Medical History:  Diagnosis Date   Allergy    Anemia    Arthritis    COPD (chronic obstructive pulmonary disease) (HCC)    Glaucoma    H/O uvulectomy    Hypertension    Osteoporosis    Ovarian cancer (HCC)    Postherpetic neuralgia    Pulmonary sarcoidosis (HCC)    Sarcoid    Past Surgical History:  Past Surgical History:  Procedure Laterality Date   ABDOMINAL HYSTERECTOMY     BREAST EXCISIONAL BIOPSY Right    benign   COLONOSCOPY WITH PROPOFOL  N/A 07/30/2019   Procedure: COLONOSCOPY WITH PROPOFOL ;  Surgeon: Toledo, Ladell POUR, MD;  Location: [facility] ENDOSCOPY;  Service: Gastroenterology;  Laterality: N/A;   DIAGNOSTIC LAPAROSCOPY     DILATION AND CURETTAGE OF UTERUS     EYE SURGERY     VULVAR LESION REMOVAL     HPI:  78yo female admitted 10/18/23 found down at home. Pt with encephalopathy and IVH. PMH: pulmonary sarcoidosis, COPD on 2L [place], HTN. 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    Assessment / Plan / Recommendation  Clinical Impression  Limited assessment today, given pt positioning, weakness/lethargy. Pt with lunch tray on table. Minimal bites/sips taken. Difficulty with positioning upright for CN exam, however, pt speech is largely intelligible, but adversely affected by significantly low vocal intensity. Clear voice quality. Pt accepted trials of thin liquid, puree, and solid textures. Extended oral prep of solid consistencies noted. Liquid wash utilized to clear oral cavity. No overt s/s aspiration following PO trials or PO meds with water  given by RN. Recommend downgrading diet to Dys2/thin liquids for energy conservation, given significantly extended oral prep and level of  fatigue. Anticipate advancing diet with improved mentation/alertness. ST will follow to assess diet tolerance and readiness to advance textures. RN/MD informed.  SLP Visit Diagnosis: Dysphagia, unspecified (R13.10)    Aspiration Risk  Risk for inadequate nutrition/hydration;Mild aspiration risk    Diet Recommendation Dysphagia 2 (Fine chop);Thin liquid    Liquid Administration via: Cup;Straw Medication Administration: Whole meds with liquid Supervision: Patient able to self feed;Staff to assist with self feeding;Intermittent supervision to cue for compensatory strategies Compensations: Minimize environmental distractions Postural Changes: Seated upright at 90 degrees;Remain upright for at least 30 minutes after po intake    Other  Recommendations Oral Care Recommendations: Oral care BID     Assistance Recommended at Discharge  TBD  Functional Status Assessment Patient has had a recent decline in their functional status and/or demonstrates limited ability to make significant improvements in function in a reasonable and predictable amount of time  Frequency and Duration min 1 x/week  2 weeks;1 week       Prognosis Prognosis for improved oropharyngeal function: Good      Swallow Study   General Date of Onset: 10/18/23 HPI: 78yo female admitted 10/18/23 found down at home. Pt with encephalopathy and IVH. PMH: pulmonary sarcoidosis, COPD on 2L [place], HTN. 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 Type of Study: Bedside Swallow Evaluation Previous Swallow Assessment: none Diet Prior to this Study: Regular;Thin liquids (Level 0) Temperature Spikes Noted: No Respiratory Status: Room air History of Recent Intubation: No Behavior/Cognition: Requires cueing;Lethargic/Drowsy Oral Cavity Assessment: Within Functional Limits  Oral Care Completed by SLP: No Oral Cavity - Dentition: Dentures, bottom;Dentures, top Vision:  (unable to assess) Self-Feeding Abilities: Total assist Patient  Positioning: Upright in chair Baseline Vocal Quality: Low vocal intensity Volitional Cough: Weak Volitional Swallow: Able to elicit    Oral/Motor/Sensory Function Overall Oral Motor/Sensory Function: Generalized oral weakness   Ice Chips Ice chips: Not tested   Thin Liquid Thin Liquid: Within functional limits Presentation: Straw    Nectar Thick Nectar Thick Liquid: Not tested   Honey Thick Honey Thick Liquid: Not tested   Puree Puree: Within functional limits Presentation: Spoon   Solid     Solid: Impaired Oral Phase Functional Implications: Prolonged oral transit;Other (comment) (extended oral prep)     Autumn [name], MSP, CCC-SLP Speech Language Pathologist Office: [phone]  Autumn [name] 10/21/2023,1:28 PM

## 2023-10-21 NOTE — Progress Notes (Addendum)
 Physical Therapy Treatment Patient Details Name: Autumn [name] MRN: [id number] DOB: Aug 11, 1945 Today's Date: 10/21/2023   History of Present Illness Patient is a 78 y/o female admitted 10/18/23 due to L LE weakness, encephalopathy found to have IVH  both lateral ventricles.  PMH positive for COPD, arthritis, glaucoma, HTN, ovarian cancer, postherpetic neuralgia, pulmonary sarcoidosis.    PT Comments  Seen with OT for progression for gait.  Patient with kyphosis present PTA per daughter but difficulty sustaining improvements despite visual cues due to limited vision.  Difficulty with self feeding noted cognitive issues and holding bolus for awhile.  MD to consult SLP.  Patient remains appropriate for inpatient rehab at d/c.   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     Mobility  Bed Mobility Overal bed mobility: Needs Assistance Bed Mobility: Supine to Sit     Supine to sit: Mod assist, HOB elevated, Used rails, +2 for safety/equipment     General bed mobility comments: cues for reaching to rail and bringing legs off EOB with A and A to lift trunk    Transfers Overall transfer level: Needs assistance Equipment used: Rolling walker (2 wheels) Transfers: Sit to/from Stand Sit to Stand: Mod assist, +2 physical assistance           General transfer comment: cues for hand placement, lifting help to stand, assist for lines and walker management and cues for direction stepping to  recliner, as well as a few extra steps forward. L lateral lean and kyphotic posture noted    Ambulation/Gait Ambulation/Gait assistance: Mod assist, +2 safety/equipment Gait Distance (Feet): 3 Feet Assistive device: Rolling walker (2 wheels) Gait Pattern/deviations: Step-to pattern, Decreased stride length, Shuffle, Trunk flexed       General Gait Details: cues for forward gaze, upright posture, assist to turn walker, manage lines and for balance, chair brought under her end of sessil   Stairs             Wheelchair Mobility     Tilt Bed    Modified Rankin (Stroke Patients Only)       Balance Overall balance assessment: Needs assistance Sitting-balance support: Feet supported, Bilateral upper extremity supported Sitting balance-Leahy Scale: Fair Sitting balance - Comments: able to sit EOB with supervision   Standing balance support: Bilateral upper extremity supported, Reliant on assistive device for balance, During functional activity                                Communication Communication Communication: No apparent difficulties Factors Affecting Communication: Difficulty expressing self;Reduced clarity of speech  Cognition Arousal: Lethargic, Alert Behavior During Therapy: Flat affect   PT - Cognitive impairments: Orientation, Attention, Initiation, Problem solving, Safety/Judgement   Orientation impairments: Place, Time, Situation                     Following commands: Impaired Following commands impaired: Follows one step commands inconsistently, Follows one step commands with increased time    Cueing  Cueing Techniques: Verbal cues, Gestural cues, Tactile cues, Visual cues  Exercises      General Comments General comments (skin integrity, edema, etc.): seen with OT, patient unable to self feed with direction/cues, MD messaged for SLP consult for speech/language and for swallowing      Pertinent Vitals/Pain Pain  Assessment Pain Assessment: Faces Faces Pain Scale: Hurts little more Pain Location: headache Pain Descriptors / Indicators: Headache Pain Intervention(s): Monitored during session    Home Living Family/patient expects to be discharged to:: Private residence Living Arrangements: Alone Available Help at Discharge: Family;Available PRN/intermittently Type of Home: House Home Access: Stairs to enter Entrance Stairs-Rails: Right Entrance Stairs-Number of Steps: 3   Home Layout: One level Home Equipment: Grab bars - tub/shower;Hand held shower head;Shower Counsellor (2 wheels)      Prior Function            PT Goals (current goals can now be found in the care plan section) Progress towards PT goals: Progressing toward goals    Frequency    Min 2X/week      PT Plan      Co-evaluation PT/OT/SLP Co-Evaluation/Treatment: Yes Reason for Co-Treatment: Complexity of the patient's impairments (multi-system involvement);For patient/therapist safety;To address functional/ADL transfers PT goals addressed during session: Mobility/safety with mobility;Balance OT goals addressed during session: ADL's and self-care;Proper use of Adaptive equipment and DME      AM-PAC PT 6 Clicks Mobility   Outcome Measure  Help needed turning from your back to your side while in a flat bed without using bedrails?: A Lot Help needed moving from lying on your back to sitting on the side of a flat bed without using bedrails?: Total Help needed moving to and from a bed to a chair (including a wheelchair)?: Total Help needed standing up from a chair using your arms (e.g., wheelchair or bedside chair)?: Total Help needed to walk in hospital room?: Total Help needed climbing 3-5 steps with a railing? : Total 6 Click Score: 7    End of Session Equipment Utilized During Treatment: Gait belt Activity Tolerance: Patient limited by fatigue Patient left: in chair;with call bell/phone within  reach;with chair alarm set Nurse Communication: Mobility status PT Visit Diagnosis: Other abnormalities of gait and mobility (R26.89);Other symptoms and signs involving the nervous system (R29.898);Muscle weakness (generalized) (M62.81);History of falling (Z91.81)     Time: 1211-1230 PT Time Calculation (min) (ACUTE ONLY): 19 min  Charges:    $Therapeutic Activity: 8-22 mins PT General Charges $$ ACUTE PT VISIT: 1 Visit                     Autumn [name], PT Acute Rehabilitation Services Office:[phone] 10/21/2023    Autumn [name] 10/21/2023, 3:16 PM

## 2023-10-21 NOTE — Evaluation (Signed)
 Occupational Therapy Evaluation Patient Details Name: Autumn [name] MRN: [id number] DOB: 05-Nov-1945 Today's Date: 10/21/2023   History of Present Illness   Patient is a 78 y/o female admitted 10/18/23 due to L LE weakness, encephalopathy found to have IVH  both lateral ventricles.  PMH positive for COPD, arthritis, glaucoma, HTN, ovarian cancer, postherpetic neuralgia, pulmonary sarcoidosis.     Clinical Impressions PTA, pt lives alone, typically Modified Independent with ADLs/mobility using cane vs walker, has assist for IADLs as needed. Pt presents now with deficits in cognition, vision, standing balance and strength. Pt A&Ox1 during session. Pt requires Mod A x 2 for bed mobility and transfer to chair using RW with consistent cues needed to sequence. Pt requires Max-Total A for all ADLs, including self feeding with noted difficulty locating food items on tray table. Based on current deficits, recommend continued inpatient follow up therapy, <3 hours/day at DC.      If plan is discharge home, recommend the following:   Two people to help with walking and/or transfers;A lot of help with walking and/or transfers;A lot of help with bathing/dressing/bathroom;Two people to help with bathing/dressing/bathroom     Functional Status Assessment   Patient has had a recent decline in their functional status and demonstrates the ability to make significant improvements in function in a reasonable and predictable amount of time.     Equipment Recommendations   Other (comment) (TBD)     Recommendations for Other Services         Precautions/Restrictions   Precautions Precautions: Fall Recall of Precautions/Restrictions: Impaired Precaution/Restrictions Comments: SBP<160 Restrictions Weight Bearing Restrictions Per Provider Order: No     Mobility Bed Mobility Overal bed mobility: Needs Assistance Bed Mobility: Supine to Sit     Supine to sit: Mod assist, HOB elevated, Used  rails, +2 for safety/equipment     General bed mobility comments: cues for reaching to rail and bringing legs off EOB with A and A to lift trunk    Transfers Overall transfer level: Needs assistance Equipment used: Rolling walker (2 wheels) Transfers: Sit to/from Stand, Bed to chair/wheelchair/BSC Sit to Stand: Mod assist, +2 physical assistance     Step pivot transfers: Mod assist, +2 safety/equipment, +2 physical assistance     General transfer comment: cues for hand placement, lifting help to stand, assist for lines and walker management and cues for direction stepping to recliner, as well as a few extra steps forward. L lateral lean and kyphotic posture noted      Balance Overall balance assessment: Needs assistance Sitting-balance support: Feet supported, Bilateral upper extremity supported Sitting balance-Leahy Scale: Fair     Standing balance support: Bilateral upper extremity supported, Reliant on assistive device for balance, During functional activity Standing balance-Leahy Scale: Poor                             ADL either performed or assessed with clinical judgement   ADL Overall ADL's : Needs assistance/impaired Eating/Feeding: Maximal assistance;Sitting Eating/Feeding Details (indicate cue type and reason): assist to locate cups, food on tray. able to grasp spoon with R hand but unable to use functionally. able to hold cup but required assist to bring to mouth. noted to chew for a long time- required cues for use of drink to assist in swallowing food. PT/OT asked MD for SLP eval. Grooming: Maximal assistance;Sitting   Upper Body Bathing: Maximal assistance;Sitting   Lower Body Bathing: Maximal assistance;+2 for physical  assistance;+2 for safety/equipment;Sit to/from stand   Upper Body Dressing : Maximal assistance   Lower Body Dressing: Maximal assistance;+2 for physical assistance;+2 for safety/equipment;Sit to/from stand   Toilet Transfer:  Moderate assistance;+2 for physical assistance;+2 for safety/equipment;BSC/3in1   Toileting- Clothing Manipulation and Hygiene: Maximal assistance;+2 for physical assistance;+2 for safety/equipment;Sitting/lateral lean;Sit to/from stand               Vision Baseline Vision/History: 1 Wears glasses;3 Glaucoma Ability to See in Adequate Light: 3 Highly impaired Patient Visual Report: Other (comment) (appears worse than baseline- difficult to assess due to impaired cognition) Vision Assessment?: Vision impaired- to be further tested in functional context Additional Comments: appears worse than baseline- difficult to assess due to impaired cognition. significant difficulty in attempting to locate items on food tray. unable to look at self in mirror to find reflection, etc. pt reports wearing glasses but when asked if reading or seeing glasses, pt unable to report     Perception Perception: Impaired Preception Impairment Details: Inattention/Neglect Perception-Other Comments: L inattention, to be further assessed   Praxis         Pertinent Vitals/Pain Pain Assessment Pain Assessment: Faces Faces Pain Scale: Hurts little more Pain Location: headache Pain Descriptors / Indicators: Headache Pain Intervention(s): Monitored during session, Limited activity within patient's tolerance     Extremity/Trunk Assessment Upper Extremity Assessment Upper Extremity Assessment: Right hand dominant;Generalized weakness;RUE deficits/detail;LUE deficits/detail RUE Deficits / Details: able to hold cup when placed in hand. able to lift UE to 60*, AAROM WFL. impaired coordination. fair grasp RUE Coordination: decreased fine motor;decreased gross motor LUE Deficits / Details: somewhat inattentive to this UE. though able to squeeze hand. lifting UE similarly as RUE with AAROM WFL. LUE Coordination: decreased fine motor;decreased gross motor   Lower Extremity Assessment Lower Extremity Assessment:  Defer to PT evaluation   Cervical / Trunk Assessment Cervical / Trunk Assessment: Kyphotic   Communication Communication Communication: Impaired Factors Affecting Communication: Difficulty expressing self;Reduced clarity of speech   Cognition Arousal: Lethargic, Alert Behavior During Therapy: Flat affect Cognition: Difficult to assess, Cognition impaired Difficult to assess due to: Impaired communication, Level of arousal Orientation impairments: Situation, Time, Place Awareness: Intellectual awareness impaired, Online awareness impaired Memory impairment (select all impairments): Short-term memory, Working Civil Service fast streamer, Conservation officer, historic buildings, Non-declarative long-term memory Attention impairment (select first level of impairment): Focused attention, Sustained attention Executive functioning impairment (select all impairments): Initiation, Organization, Reasoning, Sequencing, Problem solving OT - Cognition Comments: answering yes/no questions, somewhat lethargic. flat affect, unable to report current location- unaware of being in the hospital. slow, inconsistent command following, poor overall awareness.                 Following commands: Impaired Following commands impaired: Follows one step commands inconsistently, Follows one step commands with increased time     Cueing  General Comments   Cueing Techniques: Verbal cues;Gestural cues;Tactile cues;Visual cues      Exercises     Shoulder Instructions      Home Living Family/patient expects to be discharged to:: Private residence Living Arrangements: Alone Available Help at Discharge: Family;Available PRN/intermittently Type of Home: House Home Access: Stairs to enter [employer] of Steps: 3 Entrance Stairs-Rails: Right Home Layout: One level     Bathroom Shower/Tub: Tub/shower unit;Walk-in shower   Bathroom Toilet: Standard     Home Equipment: Grab bars - tub/shower;Hand held shower head;Shower  seat;Rolling Environmental consultant (2 wheels)          Prior Functioning/Environment Prior Level  of Function : Independent/Modified Independent;History of Falls (last six months)             Mobility Comments: using cane or walker intermittently ADLs Comments: daughter helped with some meals and pt had housekeeper but otherwise, able to manage ADLs around the home without assist    OT Problem List: Decreased strength;Decreased activity tolerance;Impaired balance (sitting and/or standing);Decreased cognition;Decreased coordination;Impaired vision/perception;Decreased safety awareness;Decreased knowledge of use of DME or AE;Decreased knowledge of precautions;Impaired UE functional use   OT Treatment/Interventions: Self-care/ADL training;Therapeutic exercise;Energy conservation;DME and/or AE instruction;Therapeutic activities;Patient/family education;Balance training      OT Goals(Current goals can be found in the care plan section)   Acute Rehab OT Goals Patient Stated Goal: none stated OT Goal Formulation: With patient Time For Goal Achievement: 11/04/23 Potential to Achieve Goals: Fair   OT Frequency:  Min 2X/week    Co-evaluation PT/OT/SLP Co-Evaluation/Treatment: Yes Reason for Co-Treatment: For patient/therapist safety;To address functional/ADL transfers   OT goals addressed during session: ADL's and self-care;Proper use of Adaptive equipment and DME      AM-PAC OT 6 Clicks Daily Activity     Outcome Measure Help from another person eating meals?: A Lot Help from another person taking care of personal grooming?: A Lot Help from another person toileting, which includes using toliet, bedpan, or urinal?: Total Help from another person bathing (including washing, rinsing, drying)?: A Lot Help from another person to put on and taking off regular upper body clothing?: A Lot Help from another person to put on and taking off regular lower body clothing?: Total 6 Click Score: 10   End  of Session Equipment Utilized During Treatment: Gait belt;Rolling walker (2 wheels) Nurse Communication: Mobility status  Activity Tolerance: Patient tolerated treatment well Patient left: in chair;with call bell/phone within reach;with chair alarm set  OT Visit Diagnosis: Unsteadiness on feet (R26.81);Other abnormalities of gait and mobility (R26.89);Muscle weakness (generalized) (M62.81)                Time: 8788-8762 OT Time Calculation (min): 26 min Charges:  OT General Charges $OT Visit: 1 Visit OT Evaluation $OT Eval Moderate Complexity: 1 Mod  Mliss NOVAK, OTR/L Acute Rehab Services Office: [phone]   Mliss Fish 10/21/2023, 1:23 PM

## 2023-10-21 NOTE — Progress Notes (Signed)
 STROKE TEAM PROGRESS NOTE   INTERIM HISTORY/SUBJECTIVE Daughter and son in law are at the bedside. Pt reclining in chair, lethargic but neuro stable, orientated X3, conversing well. Complaining of bottom pain probably from the LLE and gluteus hematoma. She stated that she has not had BM yet. Decreased appetite.   OBJECTIVE CBC    Component Value Date/Time   WBC 10.6 (H) 10/21/2023 0844   RBC 2.47 (L) 10/21/2023 0844   HGB 7.2 (L) 10/21/2023 0844   HGB 9.3 (L) 05/20/2014 0457   HCT 23.0 (L) 10/21/2023 0844   HCT 29.8 (L) 05/20/2014 0457   PLT 444 (H) 10/21/2023 0844   PLT 401 05/20/2014 0457   MCV 93.1 10/21/2023 0844   MCV 82 05/20/2014 0457   MCH 29.1 10/21/2023 0844   MCHC 31.3 10/21/2023 0844   RDW 14.8 10/21/2023 0844   RDW 13.9 05/20/2014 0457   LYMPHSABS 0.4 (L) 02/09/2018 0552   LYMPHSABS 0.3 (L) 05/20/2014 0457   MONOABS 1.0 02/09/2018 0552   MONOABS 0.9 05/20/2014 0457   EOSABS 0.0 02/09/2018 0552   EOSABS 0.0 05/20/2014 0457   BASOSABS 0.0 02/09/2018 0552   BASOSABS 0.1 05/20/2014 0457   BMET    Component Value Date/Time   NA 141 10/21/2023 0544   NA 138 05/19/2014 0453   K 3.8 10/21/2023 0544   K 4.0 05/19/2014 0453   CL 100 10/21/2023 0544   CL 97 (L) 05/19/2014 0453   CO2 30 10/21/2023 0544   CO2 39 (H) 05/19/2014 0453   GLUCOSE 90 10/21/2023 0544   GLUCOSE 124 (H) 05/19/2014 0453   BUN 18 10/21/2023 0544   BUN 20 (H) 05/19/2014 0453   CREATININE 0.73 10/21/2023 0544   CREATININE 0.80 05/19/2014 0453   CALCIUM  8.8 (L) 10/21/2023 0544   CALCIUM  9.6 05/19/2014 0453   GFRNONAA >60 10/21/2023 0544   GFRNONAA >60 05/19/2014 0453   GFRNONAA >60 12/15/2013 1649   Lab Results  Component Value Date   HGBA1C 5.3 10/20/2023    Lab Results  Component Value Date   CHOL 168 10/20/2023   HDL 56 10/20/2023   LDLCALC 97 10/20/2023   TRIG 73 10/20/2023   CHOLHDL 3.0 10/20/2023    Drugs of Abuse  No results found for: LABOPIA, COCAINSCRNUR,  LABBENZ, AMPHETMU, THCU, LABBARB   Urinalysis    Component Value Date/Time   COLORURINE YELLOW (A) 10/17/2023 1600   APPEARANCEUR HAZY (A) 10/17/2023 1600   APPEARANCEUR Clear 12/15/2013 1649   LABSPEC 1.019 10/17/2023 1600   LABSPEC 1.010 12/15/2013 1649   PHURINE 5.0 10/17/2023 1600   GLUCOSEU NEGATIVE 10/17/2023 1600   GLUCOSEU Negative 12/15/2013 1649   HGBUR NEGATIVE 10/17/2023 1600   BILIRUBINUR NEGATIVE 10/17/2023 1600   BILIRUBINUR Negative 12/15/2013 1649   KETONESUR NEGATIVE 10/17/2023 1600   PROTEINUR 100 (A) 10/17/2023 1600   NITRITE NEGATIVE 10/17/2023 1600   LEUKOCYTESUR MODERATE (A) 10/17/2023 1600   LEUKOCYTESUR Trace 12/15/2013 1649   IMAGING past 24 hours MR ANGIO HEAD WO CONTRAST Result Date: 10/21/2023 CLINICAL DATA:  Follow-up examination for hemorrhagic stroke. EXAM: MRA HEAD WITHOUT CONTRAST TECHNIQUE: Angiographic images of the Circle of Willis were acquired using MRA technique without intravenous contrast. COMPARISON:  Prior MRI from 10/19/2023 FINDINGS: Anterior circulation: Both internal carotid arteries are patent through the siphons without hemodynamically significant stenosis or other abnormality. A1 segments patent bilaterally. Normal anterior communicating artery complex. Anterior cerebral arteries patent without significant stenosis. No M1 stenosis or occlusion. Distal MCA branches perfused and symmetric. No  beading or irregularity. Posterior circulation: Visualized distal V4 segments patent without stenosis. Neither PICA origin visualized. Basilar patent without stenosis. Superior cerebellar and posterior cerebral arteries widely patent bilaterally. No beading or irregularity. Anatomic variants: None significant. Other: No intracranial aneurysm or vascular malformation. Previously identified acute IVH with associated hydrocephalus noted, grossly similar to prior. IMPRESSION: 1. Normal intracranial MRA. No large vessel occlusion, hemodynamically  significant stenosis, or other vascular abnormality. No aneurysm. 2. Acute IVH with associated hydrocephalus, not appreciably changed from prior. Electronically Signed   By: Morene Hoard M.D.   On: 10/21/2023 05:56   Vitals:   10/21/23 1031 10/21/23 1100 10/21/23 1111 10/21/23 1151  BP: 126/66 (!) 161/67  (!) 181/69  Pulse: 84 81  80  Resp:  (!) 24  (!) 24  Temp:   98.9 F (37.2 C)   TempSrc:   Oral   SpO2:  100%  95%  Weight:       PHYSICAL EXAM General: Lethargic, but not in distress  CV: Regular rate and rhythm on monitor Respiratory:  Regular, unlabored respirations on 2L [place].  No respiratory distress. GI: Abdomen soft and nontender  NEURO:  Pt is lethargic but briefly open eyes on voice, pt is alert, orientated to age, place, time. No aphasia, paucity of speech but answer questions appropriately. Following all simple commands. Able to name and repeat. No gaze palsy but right ptosis, left eye pupil 3mm, sluggish to light, right eye cloudy pupil 2.86mm, not reactive to light. 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 Seems to have mild left facial asymmetry. Tongue midline. Bilateral UEs 4/5, no drift. Bilaterally LEs 3/5, no drift. Sensation symmetrical bilaterally, b/l FTN intact although slow, gait not tested.   ASSESSMENT/PLAN Ms. TRACEE MCCREERY is a 78 y.o. female with history of HTN, HLD, COPD on home O2 PRN, pulmonary sarcoidosis, anemia, ovarian cancer, glaucoma, OA presenting from [facility] ED 7/7 with LLE weakness after being found on the floor by family on 7/6 with reports of several days of increasing confusion per family. CT imaging revealed IVH in both lateral ventricles with ventriculometry and was admitted to the ICU.   Bilateral IVH with mild hydrocephalus - etiology:  Unclear, ? 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  Suspected mild  hydrocephalus.   Repeat CT head 7/8: Stable bilateral intraventricular hemorrhage with layering blood in the posterior horns bilaterally.  Stable ventricular enlargement.   Repeat CT head 7/9: No significant interval change in acute intraventricular hemorrhage involving both lateral ventricles.  Associated ventriculomegaly, not appreciably changed.   MRI brain with and without contrast: Moderate volume acute IVH with associated hydrocephalus, relatively stable from prior.  Small focus of enhancement embedded within this hemorrhage felt to be consistent with a small vessel.  On my review, there is an atypical shape between b/l lateral ventricles, ? Ependymoma vs. AVM, less likely metastasis from history of ovarian cancer. Discussed with radiology and Dr. Onetha, recommend repeat MRI brain with and without contrast after IVH absorption (4-5 weeks) for further evaluation. MRA head unremarkable Carotid doppler pending 2D Echo 08/17/23 LVEF 60-65% LDL 97 HgbA1c 5.3 Per NSGY, no intervention indicated at this time.  VTE prophylaxis - heparin  subq No antithrombotic prior to admission, now on No antithrombotic due to IVH Therapy recommendations:  CIR Disposition:  pending  Headache associated with IVH One time dose of Fentanyl  12.5 mcg  given 10/19/23 Fioricet  PRN for short term management Headache is positional consistent with cerebral hemorrhage/irriation Now improved  Hand-shaking event 7/9 AM: fidgeting versus ? Seizure-like activity No patient history of seizures Routine EEG with mild diffuse encephalopathy without seizures or epileptiform discharges Loaded with Keppra  one-time dose Description felt to be most consistent with fidgeting per evaluating neurologist Will defer maintenance AED dosing for now unless further concern arises Negative myoclonus briefly appreciated on exam this morning 7/10 without further reported concern for seizure-like activity  Hypertension Home meds:  Norvasc ,  atenolol , losartan   Stable  On home norvasc  10, atenolol  50 bid and losartan  50 PRN hydralazine  used once overnight 7/8 and 7/9 Blood pressure elevation in association with pain 7/9 during exam, treated pain, PRN antihypertensives (hydralazine , labetalol ) for blood pressure management  Blood Pressure Goal: SBP less than 160   Hyperlipidemia Home meds:  atorvastatin  10 LDL 97, goal < 70 Increasing atorvastatin  to 20 mg PO for initiation at discharge  Other Stroke Risk Factors Advanced age  Other Active Problems Anemia and bottom pain Baseline Hgb ~ 10 Transfused 7/7 1 unit PRBC Hgb 6.4 > 7.8 > 8.7 > 7.9 > 7.8 > 8.2 > 7.2 CBC monitoring  CT abdomen pelvis:  Asymmetric expansion of the left sartorius and gluteus muscles with overlying subcutaneous soft tissue stranding, suspicious for intramuscular hematomas  Oxycodone per primary team UTI UA with WBC 21-50 Afebrile  WBC 15 > 11.6 > 13.5 > 12.9 > 10.5 > 11.8 >10.6 Off rocephin  CK elevation, improving 1826 >1151 > 683 Depression Continue home medications  COPD Arthritis Glaucoma Seen by Dr. 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 Please call with questions. Pt will follow up with Dr Rosemarie at [facility] in about 4 weeks. Thanks for the consult.   Ary Cummins, MD PhD Stroke Neurology 10/21/2023 2:09 PM    To contact Stroke Continuity provider, please refer to [email or web address]. After hours, contact General Neurology

## 2023-10-21 NOTE — Progress Notes (Signed)
 Initial Nutrition Assessment  DOCUMENTATION CODES: Severe malnutrition in context of chronic illness, Underweight  INTERVENTION:  Continue diet order as recommended per SLP Dysphagia 2, thin liquid Ensure Plus High Protein po BID, each supplement provides 350 kcal and 20 grams of protein. Magic cup TID with meals, each supplement provides 290 kcal and 9 grams of protein MVI with minerals daily Recommend consideration of Cortrak placement for supplemental nutrition if patient remains in acute care and adequate oral intake unable to be achieved; discussed with MD  NUTRITION DIAGNOSIS:  Severe Malnutrition related to chronic illness as evidenced by severe fat depletion, severe muscle depletion, percent weight loss.  GOAL:  Patient will meet greater than or equal to 90% of their needs  MONITOR:  PO intake, Supplement acceptance, Labs, Weight trends  REASON FOR ASSESSMENT:  Rounds    ASSESSMENT:  Pt presented to [facility] with encephalopathy after being found down at home. Pt found to have IVH and was transferred to [facility] for further evaluation by neurosurgery. PMH significant for pulmonary sarcoidosis, COPD, HTN, ovarian cancer s/p hysterectomy (1990s).  7/7: CT head- moderate volume IVH, mild hydrocephalus 7/9: CT head- no significant change of IVH; MRI brain- moderate volume acute IVH with associated hydrocephalus 7/11: s/p bedside swallow evaluation- diet downgraded to dysphagia 2 d/t level of fatigue  TOC working on SNF placement.   Spoke with pt at bedside. Nutrition history limited d/t lethargy and headache. Pt unable to recall receiving breakfast this morning.   She endorses eating well prior to admission though mentions that she has had difficulty trying to gain weight. Reports that she has significantly lost weight which she felt has been rapid.   Per RN in interdisciplinary rounds, concern for poor oral intake. Pt had received oatmeal which she only had consumed about 3-4  spoonfuls. Observed Ensure protein supplement on bedside table which pt had not consumed.   Pt appears to have white patches covering tongue. Reached out to MD.  Pt also without BM since PTA, unable to recall last one. Denies abdominal discomfort. Abdomen remains soft, non-distended.   Reviewed documented weight history within the last year.  Since January, it appears pt's weight has declined by 13.3% which is clinically significant for time frame.   Medications: SSI 0-9 units q4h, nystatin , miralax  daily, prednisone , senna daily  Labs:  CBG's 83-131 x24 hours  NUTRITION - FOCUSED PHYSICAL EXAM: Flowsheet Row Most Recent Value  Orbital Region Severe depletion  Upper Arm Region Severe depletion  Thoracic and Lumbar Region Severe depletion  Buccal Region Mild depletion  Temple Region Moderate depletion  Clavicle Bone Region Severe depletion  Clavicle and Acromion Bone Region Severe depletion  Scapular Bone Region Severe depletion  Dorsal Hand Severe depletion  Patellar Region Severe depletion  Anterior Thigh Region Severe depletion  Posterior Calf Region Severe depletion  Edema (RD Assessment) None  Hair Reviewed  Eyes Reviewed  Mouth Other (Comment)  [white patchy tongue]  Skin Reviewed  Nails Reviewed    Diet Order:   Diet Order             DIET DYS 2 Room service appropriate? Yes with Assist; Fluid consistency: Thin  Diet effective now                   EDUCATION NEEDS:  No education needs have been identified at this time  Skin:  Skin Assessment: Skin Integrity Issues: Skin Integrity Issues:: Stage II Stage II: lower vertebral column  Last BM:  unknown/PTA  Height:  Ht Readings from Last 1 Encounters:  10/17/23 5' 6 (1.676 m)    Weight:  Wt Readings from Last 1 Encounters:  10/21/23 43.8 kg   BMI:  Body mass index is 15.59 kg/m.  Estimated Nutritional Needs:   Kcal:  1400-1600  Protein:  70-85g  Fluid:  >/=1.5L  Royce Maris, RDN,  LDN Clinical Nutrition See AMiON for contact information.

## 2023-10-21 NOTE — TOC Progression Note (Signed)
 Transition of Care [facility]) - Progression Note    Patient Details  Name: Autumn [name] MRN: [id number] Date of Birth: 03/14/1946  Transition of Care [facility]) CM/SW Contact  Lauraine FORBES Saa, LCSW Phone Number: 10/21/2023, 11:50 AM  Clinical Narrative:     11:50 AM CSW left printed SNF options ([facility], [facility], [facility], [facility]) with their quarterly Medicare ratings at patient's bedside for patient's daughter, Shona, to review, as requested by Shona (patient is not fully oriented).  Expected Discharge Plan: Skilled Nursing Facility Barriers to Discharge: Continued Medical Work up, English as a second language teacher, SNF Pending bed offer  Expected Discharge Plan and Services In-house Referral: Clinical Social Work   Post Acute Care Choice: Skilled Nursing Facility Living arrangements for the past 2 months: Single Family Home                                       Social Determinants of Health (SDOH) Interventions SDOH Screenings   Food Insecurity: No Food Insecurity (06/30/2023)   Received from [facility]  Housing: Low Risk  (06/30/2023)   Received from [facility]  Transportation Needs: No Transportation Needs (06/30/2023)   Received from [facility]  Utilities: Not At Risk (06/30/2023)   Received from [facility]  Depression [phone]): Low Risk  (10/10/2023)  Financial Resource Strain: Low Risk  (06/30/2023)   Received from [facility]  Tobacco Use: Medium Risk (10/17/2023)    Readmission Risk Interventions     No data to display

## 2023-10-22 ENCOUNTER — Inpatient Hospital Stay (HOSPITAL_COMMUNITY)

## 2023-10-22 DIAGNOSIS — I615 Nontraumatic intracerebral hemorrhage, intraventricular: Secondary | ICD-10-CM | POA: Diagnosis not present

## 2023-10-22 DIAGNOSIS — I639 Cerebral infarction, unspecified: Secondary | ICD-10-CM | POA: Diagnosis not present

## 2023-10-22 LAB — CBC
HCT: 26.3 % — ABNORMAL LOW (ref 36.0–46.0)
Hemoglobin: 8.1 g/dL — ABNORMAL LOW (ref 12.0–15.0)
MCH: 28.7 pg (ref 26.0–34.0)
MCHC: 30.8 g/dL (ref 30.0–36.0)
MCV: 93.3 fL (ref 80.0–100.0)
Platelets: 555 K/uL — ABNORMAL HIGH (ref 150–400)
RBC: 2.82 MIL/uL — ABNORMAL LOW (ref 3.87–5.11)
RDW: 15 % (ref 11.5–15.5)
WBC: 14.1 K/uL — ABNORMAL HIGH (ref 4.0–10.5)
nRBC: 0 % (ref 0.0–0.2)

## 2023-10-22 LAB — GLUCOSE, CAPILLARY
Glucose-Capillary: 99 mg/dL (ref 70–99)
Glucose-Capillary: 113 mg/dL — ABNORMAL HIGH (ref 70–99)
Glucose-Capillary: 127 mg/dL — ABNORMAL HIGH (ref 70–99)
Glucose-Capillary: 143 mg/dL — ABNORMAL HIGH (ref 70–99)
Glucose-Capillary: 110 mg/dL — ABNORMAL HIGH (ref 70–99)
Glucose-Capillary: 100 mg/dL — ABNORMAL HIGH (ref 70–99)

## 2023-10-22 LAB — BASIC METABOLIC PANEL WITH GFR
Anion gap: 10 (ref 5–15)
BUN: 23 mg/dL (ref 8–23)
CO2: 30 mmol/L (ref 22–32)
Calcium: 9.5 mg/dL (ref 8.9–10.3)
Chloride: 98 mmol/L (ref 98–111)
Creatinine, Ser: 0.69 mg/dL (ref 0.44–1.00)
GFR, Estimated: >60 mL/min (ref >60)
Glucose, Bld: 112 mg/dL — ABNORMAL HIGH (ref 70–99)
Potassium: 4 mmol/L (ref 3.5–5.1)
Sodium: 138 mmol/L (ref 135–145)

## 2023-10-22 MED ORDER — HYDRALAZINE HCL 10 MG PO TABS
10.0000 mg | ORAL_TABLET | Freq: Two times a day (BID) | ORAL | Status: DC
  Administered 2023-10-22 – 2023-10-23 (×4): 10 mg via ORAL
  Filled 2023-10-22 (×6): qty 1

## 2023-10-22 MED ORDER — FERROUS SULFATE 325 (65 FE) MG PO TABS
325.0000 mg | ORAL_TABLET | Freq: Every day | ORAL | Status: DC
  Administered 2023-10-22 – 2023-10-23 (×2): 325 mg via ORAL
  Filled 2023-10-22 (×3): qty 1

## 2023-10-22 NOTE — Progress Notes (Signed)
 PROGRESS NOTE    Autumn [name]  FMW:[id number] DOB: 07-16-1945 DOA: 10/18/2023 PCP: Sherial Bail, MD   Brief Narrative: 78 year old with past medical history significant for pulmonary sarcoidosis, COPD on 2 L nasal cannula as needed, hypertension presented to [facility] with encephalopathy after being found down at home by family.  She was found to have IVH and was transferred to [facility] for further evaluation by neurosurgery.  Patient was evaluated by neurosurgery who recommended no intervention.  Patient was started on nicardipine  drip for hypertensive emergency.  She subsequently was transitioned to oral medications.  Patient developed right hand tremor, CT head was stable a spot EEG was negative.  She was started on Keppra  and neurology was consulted.  She was also found to have left sartorius left gluteus hematoma post fall.  Care transitioned to [facility]  on 7/10.   Assessment & Plan:   Principal Problem:   Intraventricular hemorrhage (HCC) Active Problems:   Toxic metabolic encephalopathy   Traumatic rhabdomyolysis (HCC)   AKI (acute kidney injury) (HCC)   Pressure injury of skin  1-Intraventricular hemorrhage, mild hydrocephalus Acute toxic metabolic encephalopathy -7/7: CT head: Moderate volume of intraventricular hemorrhage.  Mild hydrocephalus -7/09 CT head: No significant interval change in intraventricular hemorrhage involving both lateral ventricles.  Associated ventriculomegaly, no apparently change - MRI brain: Moderate volume of acute intraventricular hemorrhage with associated hydrocephalus, relatively stable from prior. 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 -MRA angio head: Normal intracranial MRA. No large vessel occlusion, hemodynamically significant stenosis, or other vascular abnormality. No aneurysm. Acute IVH with associated hydrocephalus, not appreciably  changed from prior. -Remain stable, answer questions, confuse.  -No antithrombotic due to IVH.  -Carotid doppler: Pending.  Stable , awaiting placement.   Hand Shaking:  -Fidgeting vs seizure -EEG negative -She was loaded with keppra .  -Hold on AED for now No further movement noticed.   Hypertensive emergency - Treated with Nicardipine  gtt.  -She has been transition to: Norvasc , losartan , atenolol  -Will add low dose hydralazine .   UTI: She was treated empirically with IV ceftriaxone .  History of pulmonary sarcoidosis Chronic prednisone  use -Continue 5 mg of prednisone  - Oxygen as needed - Continue Brovana , Yupelri  and Pulmicort   Rhabdomyolysis: Presented with a CK level of 11,000 Treated with IV fluids  Left lower extremity intramuscular hematoma CT abdomen pelvis:  Asymmetric expansion of the left sartorius and gluteus muscles with overlying subcutaneous soft tissue stranding, suspicious for intramuscular hematomas -hb stable at 8  Glaucoma: -CT noted 3 mm metallic density in the anterior left lobe at the anterior chamber, suspicious for foreign body. -Evaluated by Dr Octavia, the foreign body in question is consistent with shunt placement at [facility]. 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   Weight as of this encounter: 44 kg.   DVT prophylaxis: SCD Code Status: Full code Family Communication: Daughter over phone 7/11.  Disposition Plan:  Status is: Inpatient Remains inpatient appropriate because: management of IVH    Consultants:  Neurology Neurosurgery CCM  Procedures:    Antimicrobials:    Subjective: She is alert,  denies  pain.   Objective: Vitals:   10/22/23 0728 10/22/23 0731 10/22/23 0732 10/22/23 0800  BP: (!) 136/52   (!) 154/59  Pulse: 82   80  Resp: (!) 22   (!) 21  Temp:  (!) 97.5 F (36.4 C)    TempSrc:  Axillary    SpO2: 100% 100% 100% 100%  Weight:        Intake/Output Summary (Last 24 hours) at 10/22/2023 0810 Last data filed at 10/22/2023 9266 Gross per 24 hour  Intake 45.69 ml  Output 1500 ml  Net -1454.31 ml   Filed Weights   10/20/23 0500 10/21/23 0330 10/22/23 0700  Weight: 43.8 kg 43.8 kg 44 kg    Examination:  General exam: NAD Respiratory system: CTA Cardiovascular system: \S 1, S 2 RRR Gastrointestinal system: BS present, soft nt Central nervous system: Alert, follows command Extremities: BL LE weakness    Data Reviewed: I have personally reviewed following labs and imaging studies  CBC: Recent Labs  Lab 10/19/23 0558 10/19/23 1137 10/20/23 1104 10/21/23 0844 10/21/23 1812  WBC 10.5 11.8* 12.2* 10.6* 12.5*  HGB 7.8* 8.2* 8.5* 7.2* 8.4*  HCT 25.5* 26.2* 27.1* 23.0* 26.8*  MCV 93.4 92.9 93.1 93.1 93.1  PLT 449* 441* 497* 444* 530*   Basic Metabolic Panel: Recent Labs  Lab 10/18/23 0530 10/18/23 1017 10/19/23 0558 10/20/23 0204 10/21/23 0544 10/22/23 0410  NA 141 142 141 141 141 138  K 3.6 3.6 3.3* 4.0 3.8 4.0  CL 104 101 103 101 100 98  CO2 28 30 29  32 30 30  GLUCOSE 118* 103* 90 87 90 112*  BUN 24* 26* 21 15 18 23   CREATININE 0.71 0.73 0.82 0.80 0.73 0.69  CALCIUM  8.6* 9.1 8.7* 9.2 8.8* 9.5  MG 2.3  --   --   --   --   --   PHOS 3.0  --   --   --   --   --    GFR: Estimated Creatinine Clearance: 39.6 mL/min (by C-G formula based on SCr of 0.69 mg/dL). Liver Function Tests: Recent Labs  Lab 10/17/23 1445 10/18/23 0530  AST 58* 46*  ALT 23 25  ALKPHOS 31* 30*  BILITOT 0.9 1.3*  PROT 7.3 6.4*  ALBUMIN 3.8 3.3*   No results for input(s): LIPASE, AMYLASE in the last 168 hours. No results for input(s): AMMONIA in the last  168 hours. Coagulation Profile: Recent Labs  Lab 10/18/23 0530  INR 1.0   Cardiac Enzymes: Recent Labs  Lab 10/17/23 1445 10/18/23 1017 10/19/23 0558  CKTOTAL 1,826* 1,151* 683*   BNP (last 3 results) No results for input(s): PROBNP in the last 8760 hours. HbA1C: Recent Labs    10/20/23 0204  HGBA1C 5.3   CBG: Recent Labs  Lab 10/21/23 1809 10/21/23 1935 10/21/23 2319 10/22/23 0318 10/22/23 0729  GLUCAP 130* 122* 133* 99 113*   Lipid Profile: Recent Labs    10/20/23 0204  CHOL 168  HDL 56  LDLCALC 97  TRIG 73  CHOLHDL 3.0   Thyroid Function Tests: No results for input(s): TSH, T4TOTAL, FREET4, T3FREE, THYROIDAB in the last 72 hours. Anemia Panel: No results for input(s): VITAMINB12, FOLATE, FERRITIN, TIBC, IRON, RETICCTPCT in the last 72 hours. Sepsis Labs: No results for input(s): PROCALCITON, LATICACIDVEN in the last 168 hours.  Recent Results (from the past 240 hours)  MRSA Next Gen by PCR, Nasal     Status: None   Collection Time: 10/18/23  1:28 AM  Specimen: Nasal Mucosa; Nasal Swab  Result Value Ref Range Status   MRSA by PCR Next Gen NOT DETECTED NOT DETECTED Final    Comment: (NOTE) The GeneXpert MRSA Assay (FDA approved for NASAL specimens only), is one component of a comprehensive MRSA colonization surveillance program. It is not intended to diagnose MRSA infection nor to guide or monitor treatment for MRSA infections. Test performance is not FDA approved in patients less than 83 years old. Performed at [facility], [address] [place], [place] [address]          Radiology Studies: MR ANGIO HEAD WO CONTRAST Result Date: 10/21/2023 CLINICAL DATA:  Follow-up examination for hemorrhagic stroke. EXAM: MRA HEAD WITHOUT CONTRAST TECHNIQUE: Angiographic images of the Circle of Willis were acquired using MRA technique without intravenous contrast. COMPARISON:  Prior MRI from 10/19/2023 FINDINGS:  Anterior circulation: Both internal carotid arteries are patent through the siphons without hemodynamically significant stenosis or other abnormality. A1 segments patent bilaterally. Normal anterior communicating artery complex. Anterior cerebral arteries patent without significant stenosis. No M1 stenosis or occlusion. Distal MCA branches perfused and symmetric. No beading or irregularity. Posterior circulation: Visualized distal V4 segments patent without stenosis. Neither PICA origin visualized. Basilar patent without stenosis. Superior cerebellar and posterior cerebral arteries widely patent bilaterally. No beading or irregularity. Anatomic variants: None significant. Other: No intracranial aneurysm or vascular malformation. Previously identified acute IVH with associated hydrocephalus noted, grossly similar to prior. IMPRESSION: 1. Normal intracranial MRA. No large vessel occlusion, hemodynamically significant stenosis, or other vascular abnormality. No aneurysm. 2. Acute IVH with associated hydrocephalus, not appreciably changed from prior. Electronically Signed   By: Morene Hoard M.D.   On: 10/21/2023 05:56        Scheduled Meds:  amLODipine   10 mg Oral Daily   arformoterol   15 mcg Nebulization BID   And   revefenacin   175 mcg Nebulization Daily   And   budesonide  (PULMICORT ) nebulizer solution  0.25 mg Nebulization BID   atenolol   50 mg Oral BID   Chlorhexidine  Gluconate Cloth  6 each Topical Daily   feeding supplement  237 mL Oral BID BM   ferrous sulfate   325 mg Oral Q breakfast   insulin  aspart  0-9 Units Subcutaneous Q4H   losartan   50 mg Oral Daily   mirtazapine   7.5 mg Oral QHS   multivitamin with minerals  1 tablet Oral Daily   nystatin   5 mL Oral QID   polyethylene glycol  17 g Oral Daily   predniSONE   5 mg Oral Daily   senna  1 tablet Oral Daily   sertraline   25 mg Oral Daily   Continuous Infusions:     LOS: 4 days    Time spent: 35 minutes   Madia Carvell A  Chasitee Zenker, MD [facility]   If 7PM-7AM, please contact night-coverage [email or web address]  10/22/2023, 8:10 AM

## 2023-10-22 NOTE — Progress Notes (Signed)
 VASCULAR LAB    Bilateral lower extremity venous duplex has been performed.  See CV proc for preliminary results.   Maecyn Panning, RVT 10/22/2023, 10:47 AM

## 2023-10-22 NOTE — TOC Progression Note (Addendum)
 Transition of Care [facility]) - Progression Note    Patient Details  Name: Autumn [name] MRN: [id number] Date of Birth: [birth year]  Transition of [facility]) CM/SW Contact  Isaiah Public, LCSWA Phone Number: 10/22/2023, 2:14 PM  Clinical Narrative:     Due to patients current orientation CSW spoke with patients daughter Shona regarding patients SNF choice. Shona accepted SNF bed offer with [facility] for patient. CSW awaiting to hear back from [facility] to confirm SNF bed for patient. CSW following to start insurance authorization for patient closer to patient being medically ready for dc. CSW will continue to follow.  [facility] spoke with [facility] who informed CSW that admissions will be back on Monday. CSW to follow up with facility on Monday on bed offer and bed availability.  Expected Discharge Plan: Skilled Nursing Facility Barriers to Discharge: Continued Medical Work up, English as a second language teacher, SNF Pending bed offer  Expected Discharge Plan and Services In-house Referral: Clinical Social Work   Post Acute Care Choice: Skilled Nursing Facility Living arrangements for the past 2 months: Single Family Home                                       Social Determinants of Health (SDOH) Interventions SDOH Screenings   Food Insecurity: No Food Insecurity (06/30/2023)   Received from [facility]  Housing: Low Risk  (06/30/2023)   Received from [facility]  Transportation Needs: No Transportation Needs (06/30/2023)   Received from [facility]  Utilities: Not At Risk (06/30/2023)   Received from [facility]  Depression [phone]): Low Risk  (10/10/2023)  Financial Resource Strain: Low Risk  (06/30/2023)   Received from [facility]  Tobacco Use: Medium Risk (10/17/2023)    Readmission Risk Interventions     No data to display

## 2023-10-23 DIAGNOSIS — I615 Nontraumatic intracerebral hemorrhage, intraventricular: Secondary | ICD-10-CM | POA: Diagnosis not present

## 2023-10-23 LAB — GLUCOSE, CAPILLARY
Glucose-Capillary: 116 mg/dL — ABNORMAL HIGH (ref 70–99)
Glucose-Capillary: 118 mg/dL — ABNORMAL HIGH (ref 70–99)
Glucose-Capillary: 110 mg/dL — ABNORMAL HIGH (ref 70–99)
Glucose-Capillary: 123 mg/dL — ABNORMAL HIGH (ref 70–99)
Glucose-Capillary: 109 mg/dL — ABNORMAL HIGH (ref 70–99)

## 2023-10-23 MED ORDER — ENSURE PLUS HIGH PROTEIN PO LIQD
237.0000 mL | Freq: Three times a day (TID) | ORAL | Status: DC
  Administered 2023-10-23 – 2023-10-26 (×2): 237 mL via ORAL

## 2023-10-23 NOTE — Plan of Care (Signed)

## 2023-10-23 NOTE — Progress Notes (Signed)
 She transferred from room 14 to room 6. Pending UA. New set up for [facility] on her with cupper reporting nurse.

## 2023-10-23 NOTE — TOC Progression Note (Signed)
 Transition of Care [facility]) - Progression Note    Patient Details  Name: Autumn [name] MRN: [id number] Date of Birth: 07-Jun-1945  [facility]) CM/SW Contact  Dino CHRISTELLA Au, LCSWA Phone Number: 10/23/2023, 1:57 PM  Clinical Narrative:     SW spoke with [facility] [phone]) shara started. Ref # [id number] Clinicals faxed to: [phone]    Expected Discharge Plan: Skilled Nursing Facility Barriers to Discharge: Continued Medical Work up, English as a second language teacher, SNF Pending bed offer  Expected Discharge Plan and Services In-house Referral: Clinical Social Work   Post Acute Care Choice: Skilled Nursing Facility Living arrangements for the past 2 months: Single Family Home                                       Social Determinants of Health (SDOH) Interventions SDOH Screenings   Food Insecurity: No Food Insecurity (06/30/2023)   Received from [facility]  Housing: Low Risk  (06/30/2023)   Received from [facility]  Transportation Needs: No Transportation Needs (06/30/2023)   Received from [facility]  Utilities: Not At Risk (06/30/2023)   Received from [facility]  Depression [phone]): Low Risk  (10/10/2023)  Financial Resource Strain: Low Risk  (06/30/2023)   Received from [facility]  Tobacco Use: Medium Risk (10/17/2023)    Readmission Risk Interventions     No data to display

## 2023-10-23 NOTE — Progress Notes (Addendum)
 PROGRESS NOTE    Autumn [name]  FMW:[id number] DOB: [birth year] DOA: 10/18/2023 PCP: Sherial Bail, MD   Brief Narrative: 78 year old with past medical history significant for pulmonary sarcoidosis, COPD on 2 L nasal cannula as needed, hypertension presented to [facility] with encephalopathy after being found down at home by family.  She was found to have IVH and was transferred to [facility] for further evaluation by neurosurgery.  Patient was evaluated by neurosurgery who recommended no intervention.  Patient was started on nicardipine  drip for hypertensive emergency.  She subsequently was transitioned to oral medications.  Patient developed right hand tremor, CT head was stable a spot EEG was negative.  She was started on Keppra  and neurology was consulted.  She was also found to have left sartorius left gluteus hematoma post fall.  Care transitioned to [facility]  on 7/10.   Assessment & Plan:   Principal Problem:   Intraventricular hemorrhage (HCC) Active Problems:   Toxic metabolic encephalopathy   Traumatic rhabdomyolysis (HCC)   AKI (acute kidney injury) (HCC)   Pressure injury of skin  1-Intraventricular hemorrhage, mild hydrocephalus Acute toxic metabolic encephalopathy -7/7: CT head: Moderate volume of intraventricular hemorrhage.  Mild hydrocephalus -7/09 CT head: No significant interval change in intraventricular hemorrhage involving both lateral ventricles.  Associated ventriculomegaly, no apparently change - MRI brain: Moderate volume of acute intraventricular hemorrhage with associated hydrocephalus, relatively stable from prior. 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 -MRA angio head: Normal intracranial MRA. No large vessel occlusion, hemodynamically significant stenosis, or other vascular abnormality. No aneurysm. Acute IVH with associated hydrocephalus, not appreciably  changed from prior. -Remain stable, answer questions, confuse.  -No antithrombotic due to IVH.  -Carotid doppler: left ICA are consistent with a 1-39%  stenosis. Bilateral vertebral arteries demonstrate antegrade flow. Unable to adequately interrogate the right ICA or ECA  secondary  Stable , awaiting placement.   Dysphagia:  On dysphagia diet.  Plan was to monitor oral intake over weekend.  Continue with ensure.   Leukocytosis; follow trend.   Hand Shaking:  -Fidgeting vs seizure -EEG negative -She was loaded with keppra .  -Hold on AED for now No further movement noticed.   Hypertensive emergency - Treated with Nicardipine  gtt.  -She has been transition to: Norvasc , losartan , atenolol  -Added hydralazine .   UTI: She was treated empirically with IV ceftriaxone .  History of pulmonary sarcoidosis Chronic prednisone  use -Continue 5 mg of prednisone  - Oxygen as needed - Continue Brovana , Yupelri  and Pulmicort   Rhabdomyolysis: Presented with a CK level of 11,000 Treated with IV fluids  Left lower extremity intramuscular hematoma CT abdomen pelvis:  Asymmetric expansion of the left sartorius and gluteus muscles with overlying subcutaneous soft tissue stranding, suspicious for intramuscular hematomas -hb stable at 8  Glaucoma: -CT noted 3 mm metallic density in the anterior left lobe at the anterior chamber, suspicious for foreign body. -Evaluated by Dr Octavia, the foreign body in question is consistent with shunt placement at [facility]. 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   Weight as of this encounter: 43.7 kg.   DVT prophylaxis: SCD  Code  Status: Full code Family Communication: Daughter over phone 7/11.  Disposition Plan:  Status is: Inpatient Remains inpatient appropriate because: management of IVH. Monitor oral intake. Might be able to discharge if eating more.     Consultants:  Neurology Neurosurgery CCM  Procedures:    Antimicrobials:    Subjective: She open eyes , denies pain.  Ate 35 % of breakfast---50 % of lunch today    Objective: Vitals:   10/23/23 0711 10/23/23 0823 10/23/23 0827 10/23/23 0828  BP: (!) 147/65 (!) 147/65    Pulse: 82 84    Resp: 20 (!) 21    Temp: 98.3 F (36.8 C)     TempSrc: Oral     SpO2: 100% 100% 100% 100%  Weight:        Intake/Output Summary (Last 24 hours) at 10/23/2023 1521 Last data filed at 10/23/2023 1422 Gross per 24 hour  Intake 75 ml  Output 600 ml  Net -525 ml   Filed Weights   10/21/23 0330 10/22/23 0700 10/23/23 0500  Weight: 43.8 kg 44 kg 43.7 kg    Examination:  General exam: NAD Respiratory system: CTA Cardiovascular system: S 1, S 2 RRR Gastrointestinal system: BS present, soft nt Central nervous system: alert, follows some command Extremities: BL LE weakness    Data Reviewed: I have personally reviewed following labs and imaging studies  CBC: Recent Labs  Lab 10/19/23 1137 10/20/23 1104 10/21/23 0844 10/21/23 1812 10/22/23 1503  WBC 11.8* 12.2* 10.6* 12.5* 14.1*  HGB 8.2* 8.5* 7.2* 8.4* 8.1*  HCT 26.2* 27.1* 23.0* 26.8* 26.3*  MCV 92.9 93.1 93.1 93.1 93.3  PLT 441* 497* 444* 530* 555*   Basic Metabolic Panel: Recent Labs  Lab 10/18/23 0530 10/18/23 1017 10/19/23 0558 10/20/23 0204 10/21/23 0544 10/22/23 0410  NA 141 142 141 141 141 138  K 3.6 3.6 3.3* 4.0 3.8 4.0  CL 104 101 103 101 100 98  CO2 28 30 29  32 30 30  GLUCOSE 118* 103* 90 87 90 112*  BUN 24* 26* 21 15 18 23   CREATININE 0.71 0.73 0.82 0.80 0.73 0.69  CALCIUM  8.6* 9.1 8.7* 9.2 8.8* 9.5  MG 2.3  --   --   --   --   --   PHOS 3.0  --   --   --   --   --     GFR: Estimated Creatinine Clearance: 39.3 mL/min (by C-G formula based on SCr of 0.69 mg/dL). Liver Function Tests: Recent Labs  Lab 10/17/23 1445 10/18/23 0530  AST 58* 46*  ALT 23 25  ALKPHOS 31* 30*  BILITOT 0.9 1.3*  PROT 7.3 6.4*  ALBUMIN 3.8 3.3*   No results for input(s): LIPASE, AMYLASE in the last 168 hours. No results for input(s): AMMONIA in the last 168 hours. Coagulation Profile: Recent Labs  Lab 10/18/23 0530  INR 1.0   Cardiac Enzymes: Recent Labs  Lab 10/17/23 1445 10/18/23 1017 10/19/23 0558  CKTOTAL 1,826* 1,151* 683*   BNP (last 3 results) No results for input(s): PROBNP in the last 8760 hours. HbA1C: No results for input(s): HGBA1C in the last 72 hours.  CBG: Recent Labs  Lab 10/22/23 1930 10/22/23 2306 10/23/23 0348 10/23/23 0712 10/23/23 1216  GLUCAP 110* 100* 116* 118* 110*   Lipid Profile: No results for input(s): CHOL, HDL, LDLCALC, TRIG, CHOLHDL, LDLDIRECT in the last 72 hours.  Thyroid Function Tests: No results for input(s): TSH, T4TOTAL, FREET4, T3FREE, THYROIDAB in the last 72  hours. Anemia Panel: No results for input(s): VITAMINB12, FOLATE, FERRITIN, TIBC, IRON, RETICCTPCT in the last 72 hours. Sepsis Labs: No results for input(s): PROCALCITON, LATICACIDVEN in the last 168 hours.  Recent Results (from the past 240 hours)  MRSA Next Gen by PCR, Nasal     Status: None   Collection Time: 10/18/23  1:28 AM   Specimen: Nasal Mucosa; Nasal Swab  Result Value Ref Range Status   MRSA by PCR Next Gen NOT DETECTED NOT DETECTED Final    Comment: (NOTE) The GeneXpert MRSA Assay (FDA approved for NASAL specimens only), is one component of a comprehensive MRSA colonization surveillance program. It is not intended to diagnose MRSA infection nor to guide or monitor treatment for MRSA infections. Test performance is not FDA approved in patients less than 53 years old. Performed at  [facility], [address] [place], [place] [address]          Radiology Studies: VAS US  CAROTID Result Date: 10/23/2023 Carotid Arterial Duplex Study Patient Name:  CHARIDY CAPPELLETTI  Date of Exam:   10/22/2023 Medical Rec #: [id number]       Accession #:    [id number] Date of Birth: Apr 04, 1946       Patient Gender: F Patient Age:   63 years Exam Location:  [facility] Procedure:      VAS US  CAROTID Referring Phys: ARY XU --------------------------------------------------------------------------------  Indications:       CVA (IVH). Risk Factors:      Hypertension, hyperlipidemia. Other Factors:     Pulmonary hypertension, pulmonary sarcoidosis,. Limitations        Today's exam was limited due to the body habitus of the                    patient and the patient's inability or unwillingness to                    cooperate. Patient also complaining of left neck pain. Comparison Study:  No prior study on file Performing Technologist: Alberta Lis RVS  Examination Guidelines: A complete evaluation includes B-mode imaging, spectral Doppler, color Doppler, and power Doppler as needed of all accessible portions of each vessel. Bilateral testing is considered an integral part of a complete examination. Limited examinations for reoccurring indications may be performed as noted.  Right Carotid Findings: +----------+--------+--------+--------+------------------+------------------+           PSV cm/sEDV cm/sStenosisPlaque DescriptionComments           +----------+--------+--------+--------+------------------+------------------+ CCA Prox  128     26                                intimal thickening +----------+--------+--------+--------+------------------+------------------+ CCA Distal119     18                                intimal thickening +----------+--------+--------+--------+------------------+------------------+ ICA Prox                                             Not visualized     +----------+--------+--------+--------+------------------+------------------+ ICA Mid  Not visualized     +----------+--------+--------+--------+------------------+------------------+ ICA Distal                                          Not visualized     +----------+--------+--------+--------+------------------+------------------+ ECA                                                 Not visualized     +----------+--------+--------+--------+------------------+------------------+ +----------+--------+-------+--------+-------------------+           PSV cm/sEDV cmsDescribeArm Pressure (mmHG) +----------+--------+-------+--------+-------------------+ [id number]                                         +----------+--------+-------+--------+-------------------+ +---------+--------+---+--------+--+ VertebralPSV cm/s129EDV cm/s17 +---------+--------+---+--------+--+  Left Carotid Findings: +----------+--------+--------+--------+------------------+------------------+           PSV cm/sEDV cm/sStenosisPlaque DescriptionComments           +----------+--------+--------+--------+------------------+------------------+ CCA Prox  126     27                                intimal thickening +----------+--------+--------+--------+------------------+------------------+ CCA Distal104     17                                intimal thickening +----------+--------+--------+--------+------------------+------------------+ ICA Prox  137     24      1-39%   calcific                             +----------+--------+--------+--------+------------------+------------------+ ICA Mid   134     27                                                   +----------+--------+--------+--------+------------------+------------------+ ICA Distal133     33                                                    +----------+--------+--------+--------+------------------+------------------+ ECA       80      3                                                    +----------+--------+--------+--------+------------------+------------------+ +----------+--------+--------+--------+-------------------+           PSV cm/sEDV cm/sDescribeArm Pressure (mmHG) +----------+--------+--------+--------+-------------------+ [id number]             Stenotic                    +----------+--------+--------+--------+-------------------+ +---------+--------+---+--------+--+ VertebralPSV cm/s122EDV cm/s27 +---------+--------+---+--------+--+   Summary: Right Carotid: Unable to adequately interrogate the right ICA or ECA secondary  to above stated limitations. Left Carotid: Velocities in the left ICA are consistent with a 1-39% stenosis. Vertebrals:  Bilateral vertebral arteries demonstrate antegrade flow. Subclavians: Left subclavian artery was stenotic. Normal flow hemodynamics were              seen in the right subclavian artery. *See table(s) above for measurements and observations.  Electronically signed by Eather Popp MD on 10/23/2023 at 11:59:30 AM.    Final         Scheduled Meds:  amLODipine   10 mg Oral Daily   arformoterol   15 mcg Nebulization BID   And   revefenacin   175 mcg Nebulization Daily   And   budesonide  (PULMICORT ) nebulizer solution  0.25 mg Nebulization BID   atenolol   50 mg Oral BID   Chlorhexidine  Gluconate Cloth  6 each Topical Daily   feeding supplement  237 mL Oral BID BM   ferrous sulfate   325 mg Oral Q breakfast   hydrALAZINE   10 mg Oral BID   insulin  aspart  0-9 Units Subcutaneous Q4H   losartan   50 mg Oral Daily   mirtazapine   7.5 mg Oral QHS   multivitamin with minerals  1 tablet Oral Daily   nystatin   5 mL Oral QID   polyethylene glycol  17 g Oral Daily   predniSONE   5 mg Oral Daily   senna  1 tablet Oral Daily   sertraline   25 mg Oral Daily    Continuous Infusions:     LOS: 5 days    Time spent: 35 minutes   Trevonne Nyland A Reilyn Nelson, MD [facility]   If 7PM-7AM, please contact night-coverage [email or web address]  10/23/2023, 3:21 PM

## 2023-10-24 ENCOUNTER — Inpatient Hospital Stay (HOSPITAL_COMMUNITY)

## 2023-10-24 DIAGNOSIS — I615 Nontraumatic intracerebral hemorrhage, intraventricular: Secondary | ICD-10-CM | POA: Diagnosis not present

## 2023-10-24 LAB — GLUCOSE, CAPILLARY
Glucose-Capillary: 104 mg/dL — ABNORMAL HIGH (ref 70–99)
Glucose-Capillary: 113 mg/dL — ABNORMAL HIGH (ref 70–99)
Glucose-Capillary: 135 mg/dL — ABNORMAL HIGH (ref 70–99)
Glucose-Capillary: 138 mg/dL — ABNORMAL HIGH (ref 70–99)
Glucose-Capillary: 140 mg/dL — ABNORMAL HIGH (ref 70–99)
Glucose-Capillary: 158 mg/dL — ABNORMAL HIGH (ref 70–99)

## 2023-10-24 LAB — URINALYSIS, ROUTINE W REFLEX MICROSCOPIC
Bilirubin Urine: NEGATIVE
Glucose, UA: NEGATIVE mg/dL
Hgb urine dipstick: NEGATIVE
Ketones, ur: NEGATIVE mg/dL
Leukocytes,Ua: NEGATIVE
Nitrite: NEGATIVE
Protein, ur: 30 mg/dL — AB
Specific Gravity, Urine: 1.021 (ref 1.005–1.030)
pH: 6 (ref 5.0–8.0)

## 2023-10-24 LAB — PREPARE RBC (CROSSMATCH)

## 2023-10-24 LAB — BASIC METABOLIC PANEL WITH GFR
Anion gap: 10 (ref 5–15)
BUN: 31 mg/dL — ABNORMAL HIGH (ref 8–23)
CO2: 31 mmol/L (ref 22–32)
Calcium: 9.2 mg/dL (ref 8.9–10.3)
Chloride: 96 mmol/L — ABNORMAL LOW (ref 98–111)
Creatinine, Ser: 0.67 mg/dL (ref 0.44–1.00)
GFR, Estimated: >60 mL/min (ref >60)
Glucose, Bld: 111 mg/dL — ABNORMAL HIGH (ref 70–99)
Potassium: 4.2 mmol/L (ref 3.5–5.1)
Sodium: 137 mmol/L (ref 135–145)

## 2023-10-24 LAB — CBC
HCT: 21 % — ABNORMAL LOW (ref 36.0–46.0)
Hemoglobin: 6.6 g/dL — CL (ref 12.0–15.0)
MCH: 29.5 pg (ref 26.0–34.0)
MCHC: 31.4 g/dL (ref 30.0–36.0)
MCV: 93.8 fL (ref 80.0–100.0)
Platelets: 534 K/uL — ABNORMAL HIGH (ref 150–400)
RBC: 2.24 MIL/uL — ABNORMAL LOW (ref 3.87–5.11)
RDW: 14.9 % (ref 11.5–15.5)
WBC: 19 K/uL — ABNORMAL HIGH (ref 4.0–10.5)
nRBC: 0 % (ref 0.0–0.2)

## 2023-10-24 LAB — HEMOGLOBIN AND HEMATOCRIT, BLOOD
HCT: 28.3 % — ABNORMAL LOW (ref 36.0–46.0)
Hemoglobin: 9.3 g/dL — ABNORMAL LOW (ref 12.0–15.0)

## 2023-10-24 MED ORDER — SODIUM CHLORIDE 0.9% IV SOLUTION: Freq: Once | INTRAVENOUS | Status: AC

## 2023-10-24 NOTE — Progress Notes (Signed)
 [facility] night cross cover note:   I was notified by the patient's RN of the patient hemoglobin level of 6.6 this morning, which is relative to most recent prior hemoglobin level of 8.1 drawn on the morning of 10/22/2023.  Most recent systolic blood pressures in the 150s mmHg, while HR's are in the 80's.   I subsequently ordered an updated type and screen as well as placed orders for transfusion of 1 unit PRBC over 3 hours, along with orders for repeat H&H to be checked after completion of transfusion of this 1 unit PRBC.     Eva Pore, DO Hospitalist

## 2023-10-24 NOTE — Progress Notes (Signed)
 Speech Language Pathology Treatment: Dysphagia  Patient Details Name: Autumn [name] MRN: [id number] DOB: [birth year] Today's Date: 10/24/2023 Time: 9169-9142 SLP Time Calculation (min) (ACUTE ONLY): 27 min  Assessment / Plan / Recommendation Clinical Impression  Pt seen at bedside for follow up after BSE 10/21/23. Pt sleeping in bed. No family present. Pt awakened easily with gentle verbal and tactile stim, but required ongoing stimulation to maintain alertness. Breakfast tray in room, barely touched. Suction was set up, and oral care completed. Oral holding of breakfast items suctioned from oral cavity. Pt able to verbalize name, month/date of birth. Unable to provide birth year, current year, or location. Formal cog/com eval deferred due to mentation.  Recommend downgrade to dys1(puree)/ thin liquids with meds crushed in puree, to be given ONLY WHEN PT IS FULLY AWAKE AND ALERT. Oral care with suction after meals to clear oral cavity of residue/holding. Risk of aspiration is significant if pt is fed when poorly alert. Safe swallow precautions posted at [facility].   Also recommend Palliative Care consult to facilitate establishment of appropriate goals of care. Medical team informed of recommendations. ST will follow.   HPI HPI: 78yo female admitted 10/18/23 found down at home. Pt with encephalopathy and IVH. PMH: pulmonary sarcoidosis, COPD on 2L [place], HTN. 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      SLP Plan  Continue with current plan of care          Recommendations  Diet recommendations: Dysphagia 1 (puree);Thin liquid (ONLY WHEN PT IS FULLY AWAKE AND ALERT) Liquids provided via: Teaspoon;Cup;Straw (ONLY WHEN PT IS FULLY AWAKE AND ALERT) Medication Administration: Crushed with puree (ONLY WHEN PT IS FULLY AWAKE AND ALERT) Supervision: Full supervision/cueing for compensatory strategies;Trained caregiver to feed patient Compensations: Minimize environmental distractions Postural  Changes and/or Swallow Maneuvers: Seated upright 90 degrees;Upright 30-60 min after meal                  Oral care before and after PO     Dysphagia, unspecified (R13.10)     Continue with current plan of care    Caine Barfield B. Dory, MSP, CCC-SLP Speech Language Pathologist Office: [phone]  Dory Caprice Daring 10/24/2023, 9:05 AM

## 2023-10-24 NOTE — Progress Notes (Signed)
 VAST consult received to obtain IV access. Another nurse was able to obtain access prior to VAST arrival.

## 2023-10-24 NOTE — Progress Notes (Signed)
 T wave elevated per CCMD. Deliliah Room, MD notified. EKG obtained per order.

## 2023-10-24 NOTE — Progress Notes (Signed)
 Pt lethargic, but able to state name. Falls back to sleep easily. Unable to swallow PO meds at this time. Deliliah Room, MD notified. Shelby rapid Charity fundraiser to bedside. See new orders.

## 2023-10-24 NOTE — Progress Notes (Signed)
   10/24/23 0952  Assess: MEWS Score  Temp 98.4 F (36.9 C)  BP (!) 172/65  MAP (mmHg) 97  Pulse Rate (!) 101  Resp (!) 30  Level of Consciousness Responds to Voice  SpO2 93 %  O2 Device Nasal Cannula  O2 Flow Rate (L/min) 3 L/min  Assess: MEWS Score  MEWS Temp 0  MEWS Systolic 0  MEWS Pulse 1  MEWS RR 2  MEWS LOC 1  MEWS Score 4  MEWS Score Color Red  Assess: if the MEWS score is Yellow or Red  Were vital signs accurate and taken at a resting state? Yes  Does the patient meet 2 or more of the SIRS criteria? Yes  Does the patient have a confirmed or suspected source of infection? No  MEWS guidelines implemented  Yes, red  Treat  MEWS Interventions Considered administering scheduled or prn medications/treatments as ordered  Take Vital Signs  Increase Vital Sign Frequency  Red: Q1hr x2, continue Q4hrs until patient remains green for 12hrs  Escalate  MEWS: Escalate Red: Discuss with charge nurse and notify provider. Consider notifying RRT. If remains red for 2 hours consider need for higher level of care  Notify: Charge Nurse/RN  Name of Charge Nurse/RN Notified [place], RN  Provider Notification  Provider Name/Title Deliliah Room MD  Date Provider Notified 10/24/23  Time Provider Notified [phone]  Method of Notification Page  Notification Reason Change in status  Provider response See new orders  Date of Provider Response 10/24/23  Time of Provider Response 0920  Notify: Rapid Response  Name of Rapid Response RN Notified Leonor, RN  Date Rapid Response Notified 10/24/23  Time Rapid Response Notified 0912  Assess: SIRS CRITERIA  SIRS Temperature  0  SIRS Respirations  1  SIRS Pulse 1  SIRS WBC 0  SIRS Score Sum  2

## 2023-10-24 NOTE — Progress Notes (Signed)
 Progress Note   Patient: Autumn [name] FMW:[id number] DOB: 1945/05/23 DOA: 10/18/2023     6 DOS: the patient was seen and examined on 10/24/2023   Brief hospital course: 78 year old with past medical history significant for pulmonary sarcoidosis, COPD on 2 L nasal cannula as needed, hypertension presented to [facility] with encephalopathy after being found down at home by family. She was found to have IVH and was transferred to [facility] for further evaluation by neurosurgery. Patient was evaluated by neurosurgery who recommended no intervention. Patient was started on nicardipine  drip for hypertensive emergency. She subsequently was transitioned to oral medications. Patient developed right hand tremor, CT head was stable a spot EEG was negative. She was started on Keppra  and neurology was consulted. She was also found to have left sartorius left gluteus hematoma post fall. Care transitioned to [facility] on 7/10.  Patient has worsening mental status now and is at high risk of aspiration.   Assessment and Plan:  1-Intraventricular hemorrhage, mild hydrocephalus Acute toxic metabolic encephalopathy:  - CT scan of the head was done 04/19/2023 which showed moderate volume of intraventricular hemorrhage.  Mild hydrocephalus -Repeat CT was done on 10/19/2023: No significant interval change in intraventricular hemorrhage involving both lateral ventricles.  Associated ventriculomegaly, no apparently change - MRI brain: Moderate volume of acute intraventricular hemorrhage with associated hydrocephalus, relatively stable from prior. 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 -MRA angio head: Normal intracranial MRA. No large vessel occlusion, hemodynamically significant stenosis, or other vascular abnormality. No aneurysm. Acute IVH with associated hydrocephalus, not appreciably changed from prior. -No  anticoagulation therapy due to IVH.  -Carotid doppler: left ICA are consistent with a 1-39%  stenosis. Bilateral vertebral arteries demonstrate antegrade flow. Unable to adequately interrogate the right ICA or ECA  secondary  Patient has been intermittently confused and lethargic. Worsening encephalopathy on 10/24/2023 so repeat CT of the head without contrast has been ordered.  Patient will be kept n.p.o. high risk of breast -Palliative care consulted   Dysphagia:  Aspiration high risk.  Repeat assessment with speech therapist done on 10/24/2023.  Patient was orally holding breakfast items which were suctioned from the oral cavity.  Case discussed in length with the speech therapist at the bedside.   Leukocytosis; no acute issues     Hypertensive emergency - Treated with Nicardipine  gtt.  - As needed intravenous hydralazine .  Holding oral antihypertensives because of high risk of aspiration and worsening encephalopathy   Acute uncomplicated UTI: Treated and resolved   History of pulmonary sarcoidosis Chronic prednisone  use -Continue 5 mg of prednisone  - Oxygen as needed - Continue Brovana , Yupelri  and Pulmicort    Rhabdomyolysis: Presented with a CK level of 11,000.  Downtrending CPK levels Treated with IV fluids   Left lower extremity intramuscular hematoma CT abdomen pelvis:  Asymmetric expansion of the left sartorius and gluteus muscles with overlying subcutaneous soft tissue stranding, suspicious for intramuscular hematomas   Glaucoma: Altered -Evaluated by Dr Octavia, the foreign body in question is consistent with shunt placement at [facility]. 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  She was  appearing lethargic.  She was  not able to open her eyes and was only saying 1 word responses.  Speech therapist was also present at the bedside.  Patient was pocketing her entire food in her mouth and is at high risk of aspiration.  Physical Exam: Vitals:   10/24/23 0500 10/24/23 0735 10/24/23 0821 10/24/23 0900  BP:  (!) 145/76    Pulse:  (!) 101 94 86  Resp:    20  Temp:  98 F (36.7 C)    TempSrc:  Oral    SpO2:  (P) 100%    Weight: 42.6 kg      Constitutional: Altered mentation, unable to communicate effectively Eyes: PERRL, lids and conjunctivae normal ENMT: Mucous membranes are moist. Posterior pharynx clear of any exudate or lesions.Normal dentition.  Neck: normal, supple, no masses, no thyromegaly Respiratory: clear to auscultation bilaterally, no wheezing, no crackles. Normal respiratory effort. No accessory muscle use.  Cardiovascular: Regular rate and rhythm, no murmurs / rubs / gallops. No extremity edema. 2+ pedal pulses. No carotid bruits.  Abdomen: no tenderness, no masses palpated. No hepatosplenomegaly. Bowel sounds positive.  Musculoskeletal: no clubbing / cyanosis. No joint deformity upper and lower extremities. Skin: no rashes, lesions, ulcers. No induration Neurologic: Altered patient, unable to communicate effectively, unable to examine motor and sensory systems. Psychiatric: Altered mentation Data Reviewed:  There are no new results to review at this time.  Family Communication: None at the bedside  Disposition: Status is: Inpatient Remains inpatient appropriate because: Altered mentation, recent intraventricular hemorrhage  Planned Discharge Destination: Skilled nursing facility    Time spent: 45 minutes  Author: Deliliah Room, MD 10/24/2023 9:20 AM  For on call review [email or web address].

## 2023-10-24 NOTE — Plan of Care (Signed)
  Problem: Clinical Measurements: Goal: Will remain free from infection Outcome: Progressing Goal: Respiratory complications will improve Outcome: Progressing Goal: Cardiovascular complication will be avoided Outcome: Progressing   Problem: Education: Goal: Knowledge of General Education information will improve Description: Including pain rating scale, medication(s)/side effects and non-pharmacologic comfort measures Outcome: Not Progressing   Problem: Health Behavior/Discharge Planning: Goal: Ability to manage health-related needs will improve Outcome: Not Progressing   Problem: Clinical Measurements: Goal: Ability to maintain clinical measurements within normal limits will improve Outcome: Not Progressing Goal: Diagnostic test results will improve Outcome: Not Progressing   Problem: Activity: Goal: Risk for activity intolerance will decrease Outcome: Not Progressing   Problem: Nutrition: Goal: Adequate nutrition will be maintained Outcome: Not Progressing   Problem: Coping: Goal: Level of anxiety will decrease Outcome: Not Progressing   Problem: Elimination: Goal: Will not experience complications related to bowel motility Outcome: Not Progressing Goal: Will not experience complications related to urinary retention Outcome: Not Progressing   Problem: Pain Managment: Goal: General experience of comfort will improve and/or be controlled Outcome: Not Progressing   Problem: Safety: Goal: Ability to remain free from injury will improve Outcome: Not Progressing   Problem: Skin Integrity: Goal: Risk for impaired skin integrity will decrease Outcome: Not Progressing

## 2023-10-24 NOTE — Significant Event (Addendum)
 Rapid Response Event Note   Reason for Call :  Lethargic  Initial Focused Assessment:  Pt in bed, resting. Responds to voice. Keeps her eyes closed, but continues to mumble. Tells me her full name, does not answer additional orientation questions. Withdrawals to pain in all extremities. Movements are equal bilaterally. Pupils 4mm, EOMI. Noted to be tachypneic after CT scan, no distress. Clear breath sounds. Skin is warm, dry. Denies pain.   VS: T 98.84F, BP 160/70, HR 100, RR 31, SpO2 96% on 3LNC CBG: 158  Interventions:  -STAT CT head, per MD -Supplemental oxygen increased from [facility] to [facility] -IV access placed for PRBC transfusion ordered, Hgb 6.6 this morning  Plan of Care:    Event Summary:  MD Notified: per RN Call Time: 0912 Arrival Time: 9084 End Time: 1000  Leonor LITTIE Danker, RN

## 2023-10-24 NOTE — TOC Progression Note (Addendum)
 Transition of Care [facility]) - Progression Note    Patient Details  Name: Autumn [name] MRN: [id number] Date of Birth: 1945/12/12  Transition of [facility]) CM/SW Contact  Isaiah Public, LCSWA Phone Number: 10/24/2023, 3:26 PM  Clinical Narrative:     Patients insurance authorization has been approved Plan Auth ID# [id number] Auth ID# [id number]. Insurance authorization has been approved from 7/14-7/16. CSW spoke with Therisa with [facility] in [place] who confirmed SNF bed for patient. Therisa with [facility] confirmed she can accept patient when medically ready for dc. CSW informed MD.  Expected Discharge Plan: Skilled Nursing Facility Barriers to Discharge: Continued Medical Work up, English as a second language teacher, SNF Pending bed offer  Expected Discharge Plan and Services In-house Referral: Clinical Social Work   Post Acute Care Choice: Skilled Nursing Facility Living arrangements for the past 2 months: Single Family Home                                       Social Determinants of Health (SDOH) Interventions SDOH Screenings   Food Insecurity: Patient Unable To Answer (10/23/2023)  Housing: Unknown (10/23/2023)  Transportation Needs: Patient Unable To Answer (10/23/2023)  Utilities: Patient Unable To Answer (10/23/2023)  Depression (PHQ2-9): Low Risk  (10/10/2023)  Financial Resource Strain: Low Risk  (06/30/2023)   Received from [facility]  Social Connections: Patient Unable To Answer (10/23/2023)  Tobacco Use: Medium Risk (10/17/2023)    Readmission Risk Interventions     No data to display

## 2023-10-24 NOTE — Progress Notes (Signed)
 PT Cancellation Note  Patient Details Name: Autumn [name] MRN: [id number] DOB: May 29, 1945   Cancelled Treatment:    Reason Eval/Treat Not Completed: Medical issues which prohibited therapy (discussed with RN; pt hgb 6.6, currently receiving blood transfusion).  Aleck Daring, PT, DPT Acute Rehabilitation Services Office [phone]    Alayne ONEIDA Daring 10/24/2023, 2:29 PM

## 2023-10-24 NOTE — Plan of Care (Signed)
  Problem: Pain Managment: Goal: General experience of comfort will improve and/or be controlled Outcome: Progressing   Problem: Safety: Goal: Ability to remain free from injury will improve Outcome: Progressing   Problem: Skin Integrity: Goal: Risk for impaired skin integrity will decrease Outcome: Progressing

## 2023-10-25 DIAGNOSIS — Z7189 Other specified counseling: Secondary | ICD-10-CM | POA: Diagnosis not present

## 2023-10-25 DIAGNOSIS — I615 Nontraumatic intracerebral hemorrhage, intraventricular: Secondary | ICD-10-CM | POA: Diagnosis not present

## 2023-10-25 DIAGNOSIS — R1310 Dysphagia, unspecified: Secondary | ICD-10-CM | POA: Diagnosis not present

## 2023-10-25 LAB — TYPE AND SCREEN
ABO/RH(D): O POS
Antibody Screen: NEGATIVE
Unit division: 0

## 2023-10-25 LAB — CBC WITH DIFFERENTIAL/PLATELET
Abs Immature Granulocytes: 0.12 K/uL — ABNORMAL HIGH (ref 0.00–0.07)
Basophils Absolute: 0 K/uL (ref 0.0–0.1)
Basophils Relative: 0 %
Eosinophils Absolute: 0 K/uL (ref 0.0–0.5)
Eosinophils Relative: 0 %
HCT: 27.4 % — ABNORMAL LOW (ref 36.0–46.0)
Hemoglobin: 8.7 g/dL — ABNORMAL LOW (ref 12.0–15.0)
Immature Granulocytes: 1 %
Lymphocytes Relative: 1 %
Lymphs Abs: 0.2 K/uL — ABNORMAL LOW (ref 0.7–4.0)
MCH: 28.5 pg (ref 26.0–34.0)
MCHC: 31.8 g/dL (ref 30.0–36.0)
MCV: 89.8 fL (ref 80.0–100.0)
Monocytes Absolute: 1.7 K/uL — ABNORMAL HIGH (ref 0.1–1.0)
Monocytes Relative: 9 %
Neutro Abs: 18.4 K/uL — ABNORMAL HIGH (ref 1.7–7.7)
Neutrophils Relative %: 89 %
Platelets: 442 K/uL — ABNORMAL HIGH (ref 150–400)
RBC: 3.05 MIL/uL — ABNORMAL LOW (ref 3.87–5.11)
RDW: 16.7 % — ABNORMAL HIGH (ref 11.5–15.5)
WBC: 20.6 K/uL — ABNORMAL HIGH (ref 4.0–10.5)
nRBC: 0.2 % (ref 0.0–0.2)

## 2023-10-25 LAB — GLUCOSE, CAPILLARY
Glucose-Capillary: 113 mg/dL — ABNORMAL HIGH (ref 70–99)
Glucose-Capillary: 113 mg/dL — ABNORMAL HIGH (ref 70–99)
Glucose-Capillary: 117 mg/dL — ABNORMAL HIGH (ref 70–99)
Glucose-Capillary: 120 mg/dL — ABNORMAL HIGH (ref 70–99)
Glucose-Capillary: 128 mg/dL — ABNORMAL HIGH (ref 70–99)
Glucose-Capillary: 145 mg/dL — ABNORMAL HIGH (ref 70–99)

## 2023-10-25 LAB — BPAM RBC
Blood Product Expiration Date: 202507212359
ISSUE DATE / TIME: 202507141108
Unit Type and Rh: 5100

## 2023-10-25 NOTE — Care Management Important Message (Signed)
 Important Message  Patient Details  Name: Autumn [name] MRN: [id number] Date of Birth: 1946/02/01   Important Message Given:  Yes - Medicare IM     Claretta Deed 10/25/2023, 4:02 PM

## 2023-10-25 NOTE — Consult Note (Signed)
 Consultation Note Date: 10/25/2023   Patient Name: Autumn [name]  DOB: 1945/06/27  MRN: [id number]  Age / Sex: 78 y.o., female  PCP: Sherial Bail, MD Referring Physician: Dino Antu, MD  Reason for Consultation:  encephalopathy, unable to tolerate po, high risk of aspiration, intraventricular hemorrhage  HPI/Patient Profile: 78 y.o. female  with past medical history of pulmonary sarcoidosis, COPD, HTN admitted on 10/18/2023 after falling at home and being found by her daughter with AMS. Workup revealed IVH. Neurosurgery had no recommended interventions. Also found to have L sartorius and left gluteus hematoma. Recovery has been complicated by worsening mental status on 7/14. CT scan did not show any acute changes. Palliative medicine consulted for goals of care.   Primary Decision Maker NEXT OF KIN- daughter and son together  Discussion: Chart reviewed including labs, progress notes, imaging from this and previous encounters.  Reviewed initial MRI showing IVH.  SLP note reviewed - pt diet down graded due to significant risk of aspiration given her poor mental status.  Discussed with bedside RN. She has not been awake enough to eat.  On eval she responds verbally - able to tell me her name. When asked if she is in pain she said no. Otherwise mumbles unintelligibly. Opens her eyes briefly.  I met outside her room with her daughter, Autumn [name].   Prior to admission Ramon was living at home alone, able to complete her own ADLs. Enjoyed going to church with her daughter. She did not drive. She was married- her spouse died a few years ago.  We discussed Shakiera's current medical state. Reviewed her IVH and worsening of her mental status.  We discussed best case scenario post brain injury which would include Lorien improving well and rehabilitation. We discussed the worst case which is little improvement and  continued complications. I shared that often patient end up somewhere in the middle requiring a new level of care often for the rest of their life.  Advanced care planning- 30 mins With Rhonda's permission we discussed ACP for patient.  Autumn is hopeful that patient's mental status will improve. She understands that patient is going to require a higher level of care and assistance than she previously needed.  We discussed how her mental status is affecting her ability to eat.  Artificial feeding, life support, and code status were discussed.  Autumn believes patient would want temporary feeding placed in efforts to give her mom more time for mental status to improve. If mental status doesn't improve we discussed that options would be PEG vs transition to comfort/hospice care.  Code status was discussed- Autumn does not think that patient would want attempts made at resuscitation and agrees with DNR order, however, she needs to discuss this first with her brother.    SUMMARY OF RECOMMENDATIONS -New IVH- worsening mental status- continue current interventions, daughter agrees with temporary feeding tube, understands if mental status doesn't show improvement then will need to discuss PEG vs transition to comfort/hospice -Code status- daughter agrees with DNR status  but needs to discuss with her brother first -PMT will continue to follow    Code Status/Advance Care Planning:   Code Status: Full Code    Prognosis:   Unable to determine  Discharge Planning: To Be Determined  Primary Diagnoses: Present on Admission: **None**   Review of Systems  Unable to perform ROS: Mental status change    Physical Exam Vitals and nursing note reviewed.  Constitutional:      Comments: Very frail, thin  Eyes:     Comments: Left eye bruise  Skin:    General: Skin is warm and dry.  Neurological:     Mental Status: She is disoriented.     Vital Signs: BP (!) 154/62   Pulse 91   Temp 99.1 F  (37.3 C) (Oral)   Resp 17   Wt 38.4 kg   SpO2 100%   BMI 13.66 kg/m  Pain Scale: PAINAD   Pain Score: 0-No pain   SpO2: SpO2: 100 % O2 Device:SpO2: 100 % O2 Flow Rate: .O2 Flow Rate (L/min): 2 L/min  IO: Intake/output summary:  Intake/Output Summary (Last 24 hours) at 10/25/2023 1601 Last data filed at 10/24/2023 1930 Gross per 24 hour  Intake --  Output 300 ml  Net -300 ml    LBM: Last BM Date : 10/21/23 Baseline Weight: Weight: 48.8 kg Most recent weight: Weight: 38.4 kg       Thank you for this consult. Palliative medicine will continue to follow and assist as needed.   Signed by: Cassondra Stain, AGNP-C Palliative Medicine  Time includes:   Preparing to see the patient (e.g., review of tests) Obtaining and/or reviewing separately obtained history Performing a medically necessary appropriate examination and/or evaluation Counseling and educating the patient/family/caregiver Ordering medications, tests, or procedures Referring and communicating with other health care professionals (when not reported separately) Documenting clinical information in the electronic or other health record Independently interpreting results (not reported separately) and communicating results to the patient/family/caregiver Care coordination (not reported separately) Clinical documentation   Please contact Palliative Medicine Team phone at [phone] for questions and concerns.  For individual provider: See Tracey

## 2023-10-25 NOTE — Progress Notes (Signed)
 Progress Note   Patient: Autumn [name] FMW:[id number] DOB: December 19, 1945 DOA: 10/18/2023     7 DOS: the patient was seen and examined on 10/25/2023   Brief hospital course: 78 year old with past medical history significant for pulmonary sarcoidosis, COPD on 2 L nasal cannula as needed, hypertension presented to [facility] with encephalopathy after being found down at home by family. She was found to have IVH and was transferred to [facility] for further evaluation by neurosurgery. Patient was evaluated by neurosurgery who recommended no intervention. Patient was started on nicardipine  drip for hypertensive emergency. She subsequently was transitioned to oral medications. Patient developed right hand tremor, CT head was stable a spot EEG was negative. She was started on Keppra  and neurology was consulted. She was also found to have left sartorius left gluteus hematoma post fall. Care transitioned to [facility]  hospitalist on 7/10.  Patient has worsening mental status now and is at high risk of aspiration.   Assessment and Plan:  1-Intraventricular hemorrhage, mild hydrocephalus Acute toxic metabolic encephalopathy:  worsening mentation - CT scan of the head was done 04/19/2023 which showed moderate volume of intraventricular hemorrhage.  Mild hydrocephalus -Repeat CT was done on 10/19/2023: No significant interval change in intraventricular hemorrhage involving both lateral ventricles.  Associated ventriculomegaly, no apparently change - MRI brain: Moderate volume of acute intraventricular hemorrhage with associated hydrocephalus, relatively stable from prior. 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 -MRA angio head: Normal intracranial MRA. No large vessel occlusion, hemodynamically significant stenosis, or other vascular abnormality. No aneurysm. Acute IVH with associated hydrocephalus, not appreciably changed  from prior. -No anticoagulation therapy due to IVH.  -Carotid doppler: left ICA are consistent with a 1-39%  stenosis. Bilateral vertebral arteries demonstrate antegrade flow. Unable to adequately interrogate the right ICA or ECA  secondary  Worsening encephalopathy on 10/24/2023 so CT of the head without contrast was done which didn't show any new findings.  Patient will be kept n.p.o. because of high risk of aspiration -Palliative care consulted  Normocytic anemia: Received one unit of PRBC on 7/14. Hb stable. No evidence of active bleeding.   Dysphagia:  Extremely high risk of Aspiration.  Repeat assessment with speech therapist done on 10/24/2023.  Patient was orally holding breakfast items which were suctioned from the oral cavity.  NPO until awake and alert Not a good candidate for PEG as her overall condition is not expected to improve.   Leukocytosis; no acute issues     Hypertensive emergency - Treated with Nicardipine  gtt.  - As needed intravenous hydralazine .  Holding oral antihypertensives    Acute uncomplicated UTI: Treated and resolved   History of pulmonary sarcoidosis Chronic prednisone  use -Continue 5 mg of prednisone , held now because of altered mentation. - Oxygen as needed - Continue Brovana , Yupelri  and Pulmicort    Rhabdomyolysis: Presented with a CK level of 11,000.  Downtrending CPK levels Treated with IV fluids   Left lower extremity intramuscular hematoma CT abdomen pelvis:  Asymmetric expansion of the left sartorius and gluteus muscles with overlying subcutaneous soft tissue stranding, suspicious for intramuscular hematomas   Glaucoma: Altered -Evaluated by Dr Octavia, the foreign body in question is consistent with shunt placement at [facility]. 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  Unable to communicate. She was mumbling incomprehensibly.   Physical Exam: Vitals:   10/25/23 0448 10/25/23 0745 10/25/23 0833 10/25/23 0836  BP:  (!) 149/62    Pulse:  100    Resp:  16    Temp:  99.3 F (37.4 C)    TempSrc:  Oral    SpO2:  100% 98% 98%  Weight: 38.4 kg      Constitutional: Altered mentation, unable to communicate effectively Eyes: PERRL, lids and conjunctivae normal ENMT: Mucous membranes are moist. Posterior pharynx clear of any exudate or lesions  Neck: normal, supple, no masses, no thyromegaly Respiratory: clear to auscultation bilaterally, no wheezing, no crackles. Normal respiratory effort. No accessory muscle use.  Cardiovascular: Regular rate and rhythm, no murmurs / rubs / gallops. No extremity edema. 2+ pedal pulses. No carotid bruits.  Abdomen: no tenderness, no masses palpated. No hepatosplenomegaly. Bowel sounds positive.  Musculoskeletal: no clubbing / cyanosis. No joint deformity upper and lower extremities. Skin: no rashes, lesions, ulcers. No induration Neurologic: Altered patient, unable to communicate effectively, unable to examine motor and sensory systems. Psychiatric: Altered mentation Data Reviewed:  There are no new results to review at this time.  Family Communication: None at the bedside  Disposition: Status is: Inpatient Remains inpatient appropriate because: Altered mentation, recent intraventricular hemorrhage  Planned Discharge Destination: Skilled nursing facility    Time spent: 46 minutes  Author: Deliliah Room, MD 10/25/2023 9:30 AM  For on call review [email or web address].

## 2023-10-25 NOTE — Plan of Care (Signed)
  Problem: Pain Managment: Goal: General experience of comfort will improve and/or be controlled Outcome: Progressing   Problem: Safety: Goal: Ability to remain free from injury will improve Outcome: Progressing   Problem: Skin Integrity: Goal: Risk for impaired skin integrity will decrease Outcome: Progressing

## 2023-10-25 NOTE — Progress Notes (Addendum)
 Occupational Therapy Treatment Patient Details Name: Autumn [name] MRN: [id number] DOB: [birth year] Today's Date: 10/25/2023   History of present illness Patient is a 78 y/o female admitted 10/18/23 due to L LE weakness, encephalopathy found to have IVH  both lateral ventricles.  PMH positive for COPD, arthritis, glaucoma, HTN, ovarian cancer, postherpetic neuralgia, pulmonary sarcoidosis.   OT comments  Pt with no progress towards OT goals this session and presenting with functional decline since initial evaluation. Pt requiring up to Total A x 2 for bed mobility, Total A for sitting balance and overall poor command following. Pt with decreased appropriate responses to questions though did assist to initiate one basic ADL task (washing face) during session. Eyes remained closed for most of session. Will continue to follow acutely.      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 increased assist to return to supine    Transfers                   General transfer  comment: deferred due to limited command following and poor sitting balance     Balance Overall balance assessment: Needs assistance Sitting-balance support: Feet supported, Single extremity supported, No upper extremity supported Sitting balance-Leahy Scale: Zero Sitting balance - Comments: sitting EOB with max A and no noted righting reactions                                   ADL either performed or assessed with clinical judgement   ADL Overall ADL's : Needs assistance/impaired Eating/Feeding: Bed level;Total assistance Eating/Feeding Details (indicate cue type and reason): total assist to sip from cup seated in bed with HOB elevated. Grooming: Moderate assistance;Bed level;Wash/dry face               Lower Body Dressing: Total assistance;Bed level Lower Body Dressing Details (indicate cue type and reason): purewick pulled out and underwear pulled to knees on entry, Total A to pull back up in bed                    Extremity/Trunk Assessment Upper Extremity Assessment Upper Extremity Assessment: Difficult to assess due to impaired cognition;Right hand dominant   Lower Extremity Assessment Lower Extremity Assessment: Defer to PT evaluation        Vision   Vision Assessment?: Vision impaired- to be further tested in functional context Additional Comments: glaucoma at baseline, per chart, blind in R eye but unsure of vision in L eye   Perception     Praxis     Communication Communication Communication: No  apparent difficulties Factors Affecting Communication: Difficulty expressing self;Reduced clarity of speech   Cognition Arousal: Lethargic Behavior During Therapy: Flat affect Cognition: Difficult to assess, Cognition impaired Difficult to assess due to: Impaired communication, Level of arousal Orientation impairments: Situation, Time, Place Awareness: Intellectual awareness impaired, Online awareness impaired Memory impairment (select all  impairments): Short-term memory, Working [employer] fast streamer, Conservation officer, historic buildings, Non-declarative long-term memory Attention impairment (select first level of impairment): Focused attention Executive functioning impairment (select all impairments): Initiation, Organization, Reasoning, Sequencing, Problem solving OT - Cognition Comments: less responsive with answering questions, often repeating same phrase though nonsensical. poor command following aside from one instance of fair initiation to wash face when cued in bed and when cued to open eyes to look outside to determine if it was sunny or raining with pt accurate in this observation. No responses to location, time of day, etc                 Following commands: Impaired Following commands impaired: Follows one step commands inconsistently      Cueing   Cueing Techniques: Verbal cues, Gestural cues, Tactile cues, Visual cues  Exercises      Shoulder Instructions       General Comments      Pertinent Vitals/ Pain       Pain Assessment Pain Assessment: Faces Faces Pain Scale: Hurts even more Pain Location: L hip? due to pt reaching to this area but unable to report Pain Descriptors / Indicators: Grimacing, Guarding, Moaning Pain Intervention(s): Monitored during session, Limited activity within patient's tolerance  Home Living                                          Prior Functioning/Environment              Frequency  Min 1X/week        Progress Toward Goals  OT Goals(current goals can now be found in the care plan section)  Progress towards OT goals: Not progressing toward goals - comment  Acute Rehab OT Goals Patient Stated Goal: none stated OT Goal Formulation: With patient Time For Goal Achievement: 11/04/23 Potential to Achieve Goals: Fair ADL Goals Pt Will Perform Eating: with min assist;sitting Pt Will Perform Grooming: with min assist;sitting Pt Will Transfer to Toilet: with  min assist;stand pivot transfer;bedside commode Additional ADL Goal #1: Pt to demonstrate ability to scan L/R to identify 5/5 ADL items  Plan      Co-evaluation    PT/OT/SLP Co-Evaluation/Treatment: Yes Reason for Co-Treatment: Complexity of the patient's impairments (multi-system involvement);For patient/therapist safety;To address functional/ADL transfers PT goals addressed during session: Mobility/safety with mobility;Balance OT goals addressed during session: ADL's and self-care;Proper use of Adaptive equipment and DME      AM-PAC OT 6 Clicks Daily Activity     Outcome Measure   Help from another person eating meals?: A Lot Help from another person taking care of personal grooming?: A Lot Help from another person toileting, which includes using toliet, bedpan, or urinal?: Total Help from another person bathing (including washing, rinsing, drying)?: A Lot Help from another person to put on and taking off regular upper body clothing?: A Lot Help from another person to put on and taking off regular lower body clothing?: Total 6 Click Score: 10    End of Session    OT Visit Diagnosis: Unsteadiness on feet (R26.81);Other abnormalities of  gait and mobility (R26.89);Muscle weakness (generalized) (M62.81)   Activity Tolerance Patient limited by lethargy;Patient limited by fatigue   Patient Left in bed;with call bell/phone within reach;with bed alarm set   Nurse Communication          Time: 8985-8961 OT Time Calculation (min): 24 min  Charges: OT General Charges $OT Visit: 1 Visit OT Treatments $Self Care/Home Management : 8-22 mins  Mliss NOVAK, OTR/L Acute Rehab Services Office: [phone]   Mliss Fish 10/25/2023, 12:03 PM

## 2023-10-25 NOTE — Progress Notes (Signed)
 Physical Therapy Treatment Patient Details Name: Autumn [name] MRN: [id number] DOB: Jun 01, 1945 Today's Date: 10/25/2023   History of Present Illness Patient is a 78 y/o female admitted 10/18/23 due to L LE weakness, encephalopathy found to have IVH  both lateral ventricles.  PMH positive for COPD, arthritis, glaucoma, HTN, ovarian cancer, postherpetic neuralgia, pulmonary sarcoidosis.    PT Comments  Pt received in supine and demonstrates decreased alertness and awareness this session. Pt requires max-total A +2 for bed mobility and max A for sitting balance due to posterior lean. Pt unable to correct balance with cues and does not follow commands for exercises. Pt holds LLE and moans with attempts to mobilize, but unable to report location of pain. Pt continues to benefit from PT services to progress toward functional mobility goals.    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 increased assist to return to supine    Transfers                   General transfer  comment: deferred due to limited command following and poor sitting balance    Ambulation/Gait                   Stairs             Wheelchair Mobility     Tilt Bed    Modified Rankin (Stroke Patients Only) Modified Rankin (Stroke Patients Only) Pre-Morbid Rankin Score: Slight disability Modified Rankin: Severe disability     Balance Overall balance assessment: Needs assistance Sitting-balance support: Feet supported, Single extremity supported, No upper extremity supported Sitting balance-Leahy Scale: Poor Sitting balance - Comments: sitting EOB with max A and no noted righting reactions       Standing balance comment: nt                            Communication Communication Communication: No apparent difficulties Factors Affecting Communication: Difficulty expressing self;Reduced clarity of speech  Cognition Arousal: Lethargic Behavior During Therapy: Flat affect   PT - Cognitive impairments: Orientation, Attention, Initiation, Problem solving, Safety/Judgement                       PT - Cognition Comments: Pt mumbling throughout session and does not open eyes despite cues Following commands: Impaired Following commands impaired: Follows one step commands inconsistently    Cueing Cueing Techniques: Verbal cues, Gestural cues, Tactile cues, Visual cues  Exercises      General Comments        Pertinent Vitals/Pain Pain Assessment Pain Assessment: Faces  Faces Pain Scale: Hurts little more Pain Location: LLE Pain Descriptors / Indicators: Grimacing, Guarding, Moaning Pain Intervention(s): Limited activity within patient's tolerance, Monitored during session, Repositioned     PT Goals (current goals can now be found in the care plan section) Acute Rehab PT Goals Patient Stated Goal: return home PT Goal Formulation: With patient/family Time For Goal Achievement: 11/02/23 Progress towards PT goals: Not progressing toward  goals - comment (limited by impaired cognition and command following)    Frequency    Min 2X/week      PT Plan      Co-evaluation PT/OT/SLP Co-Evaluation/Treatment: Yes Reason for Co-Treatment: Complexity of the patient's impairments (multi-system involvement);For patient/therapist safety;To address functional/ADL transfers PT goals addressed during session: Mobility/safety with mobility;Balance        AM-PAC PT 6 Clicks Mobility   Outcome Measure  Help needed turning from your back to your side while in a flat bed without using bedrails?: A Lot Help needed moving from lying on your back to sitting on the side of a flat bed without using bedrails?: Total Help needed moving to and from a bed to a chair (including a wheelchair)?: Total Help needed standing up from a chair using your arms (e.g., wheelchair or bedside chair)?: Total Help needed to walk in hospital room?: Total Help needed climbing 3-5 steps with a railing? : Total 6 Click Score: 7    End of Session   Activity Tolerance: Patient limited by fatigue;Other (comment) (limited command following) Patient left: in bed;with call bell/phone within reach;with bed alarm set Nurse Communication: Mobility status PT Visit Diagnosis: Other abnormalities of gait and mobility (R26.89);Other symptoms and signs involving the nervous system (R29.898);Muscle weakness (generalized) (M62.81);History of falling (Z91.81)     Time: 1014-1030 PT Time Calculation (min) (ACUTE ONLY): 16 min  Charges:    $Therapeutic Activity: 8-22 mins PT General Charges $$ ACUTE PT VISIT: 1 Visit                    Darryle George, PTA Acute Rehabilitation Services Secure Chat Preferred  [phone]    Darryle George 10/25/2023, 11:54 AM

## 2023-10-26 ENCOUNTER — Inpatient Hospital Stay (HOSPITAL_COMMUNITY)

## 2023-10-26 ENCOUNTER — Telehealth: Admitting: Student in an Organized Health Care Education/Training Program

## 2023-10-26 DIAGNOSIS — G928 Other toxic encephalopathy: Secondary | ICD-10-CM | POA: Diagnosis not present

## 2023-10-26 DIAGNOSIS — I615 Nontraumatic intracerebral hemorrhage, intraventricular: Secondary | ICD-10-CM | POA: Diagnosis not present

## 2023-10-26 DIAGNOSIS — Z7189 Other specified counseling: Secondary | ICD-10-CM | POA: Diagnosis not present

## 2023-10-26 LAB — CBC WITH DIFFERENTIAL/PLATELET
Abs Immature Granulocytes: 0.13 K/uL — ABNORMAL HIGH (ref 0.00–0.07)
Basophils Absolute: 0 K/uL (ref 0.0–0.1)
Basophils Relative: 0 %
Eosinophils Absolute: 0 K/uL (ref 0.0–0.5)
Eosinophils Relative: 0 %
HCT: 25.5 % — ABNORMAL LOW (ref 36.0–46.0)
Hemoglobin: 8.1 g/dL — ABNORMAL LOW (ref 12.0–15.0)
Immature Granulocytes: 1 %
Lymphocytes Relative: 2 %
Lymphs Abs: 0.4 K/uL — ABNORMAL LOW (ref 0.7–4.0)
MCH: 28.7 pg (ref 26.0–34.0)
MCHC: 31.8 g/dL (ref 30.0–36.0)
MCV: 90.4 fL (ref 80.0–100.0)
Monocytes Absolute: 1.5 K/uL — ABNORMAL HIGH (ref 0.1–1.0)
Monocytes Relative: 8 %
Neutro Abs: 15.8 K/uL — ABNORMAL HIGH (ref 1.7–7.7)
Neutrophils Relative %: 89 %
Platelets: 506 K/uL — ABNORMAL HIGH (ref 150–400)
RBC: 2.82 MIL/uL — ABNORMAL LOW (ref 3.87–5.11)
RDW: 15.6 % — ABNORMAL HIGH (ref 11.5–15.5)
WBC: 17.8 K/uL — ABNORMAL HIGH (ref 4.0–10.5)
nRBC: 0 % (ref 0.0–0.2)

## 2023-10-26 LAB — BASIC METABOLIC PANEL WITH GFR
Anion gap: 12 (ref 5–15)
BUN: 51 mg/dL — ABNORMAL HIGH (ref 8–23)
CO2: 32 mmol/L (ref 22–32)
Calcium: 9.2 mg/dL (ref 8.9–10.3)
Chloride: 99 mmol/L (ref 98–111)
Creatinine, Ser: 0.81 mg/dL (ref 0.44–1.00)
GFR, Estimated: >60 mL/min (ref >60)
Glucose, Bld: 118 mg/dL — ABNORMAL HIGH (ref 70–99)
Potassium: 3.8 mmol/L (ref 3.5–5.1)
Sodium: 143 mmol/L (ref 135–145)

## 2023-10-26 LAB — GLUCOSE, CAPILLARY
Glucose-Capillary: 112 mg/dL — ABNORMAL HIGH (ref 70–99)
Glucose-Capillary: 120 mg/dL — ABNORMAL HIGH (ref 70–99)
Glucose-Capillary: 130 mg/dL — ABNORMAL HIGH (ref 70–99)
Glucose-Capillary: 131 mg/dL — ABNORMAL HIGH (ref 70–99)
Glucose-Capillary: 135 mg/dL — ABNORMAL HIGH (ref 70–99)
Glucose-Capillary: 147 mg/dL — ABNORMAL HIGH (ref 70–99)
Glucose-Capillary: 160 mg/dL — ABNORMAL HIGH (ref 70–99)

## 2023-10-26 LAB — LACTIC ACID, PLASMA
Lactic Acid, Venous: 1.3 mmol/L (ref 0.5–1.9)
Lactic Acid, Venous: 1.2 mmol/L (ref 0.5–1.9)

## 2023-10-26 LAB — PROCALCITONIN: Procalcitonin: 2 ng/mL

## 2023-10-26 MED ORDER — FREE WATER
75.0000 mL | Status: DC
  Administered 2023-10-26 – 2023-10-27 (×6): 75 mL

## 2023-10-26 MED ORDER — SODIUM CHLORIDE 0.9 % IV SOLN: INTRAVENOUS | Status: AC

## 2023-10-26 MED ORDER — THIAMINE MONONITRATE 100 MG PO TABS
100.0000 mg | ORAL_TABLET | Freq: Every day | ORAL | Status: DC
  Administered 2023-10-26 – 2023-10-27 (×2): 100 mg
  Filled 2023-10-26 (×2): qty 1

## 2023-10-26 MED ORDER — OSMOLITE 1.2 CAL PO LIQD
1000.0000 mL | ORAL | Status: DC
  Administered 2023-10-26 – 2023-10-27 (×2): 1000 mL

## 2023-10-26 MED ORDER — PROSOURCE TF20 ENFIT COMPATIBL EN LIQD
60.0000 mL | Freq: Every day | ENTERAL | Status: DC
  Administered 2023-10-26 – 2023-10-27 (×2): 60 mL
  Filled 2023-10-26 (×2): qty 60

## 2023-10-26 MED ORDER — METOCLOPRAMIDE HCL 5 MG/5ML PO SOLN
10.0000 mg | Freq: Four times a day (QID) | ORAL | Status: DC
Start: 2023-10-26 — End: 2023-10-28
  Administered 2023-10-26 – 2023-10-27 (×6): 10 mg
  Filled 2023-10-26 (×6): qty 10

## 2023-10-26 MED ORDER — OSMOLITE 1.2 CAL PO LIQD: 1000.0000 mL | ORAL | Status: DC

## 2023-10-26 MED ORDER — SODIUM CHLORIDE 0.9 % IV SOLN
2.0000 g | INTRAVENOUS | Status: DC
  Administered 2023-10-26 – 2023-10-27 (×2): 2 g via INTRAVENOUS
  Filled 2023-10-26 (×2): qty 20

## 2023-10-26 MED ORDER — SODIUM CHLORIDE 0.9 % IV SOLN: 1.0000 g | INTRAVENOUS | Status: DC

## 2023-10-26 NOTE — Progress Notes (Signed)
 Daily Progress Note   Patient Name: Autumn [name]       Date: 10/26/2023 DOB: November 07, 1945  Age: 78 y.o. MRN#: [id number] Attending Physician: Dino Antu, MD Primary Care Physician: Sherial Bail, MD Admit Date: 10/18/2023  Reason for Consultation/Follow-up: Establishing goals of care  Patient Profile/HPI:   78 y.o. female  with past medical history of pulmonary sarcoidosis, COPD, HTN admitted on 10/18/2023 after falling at home and being found by her daughter with AMS. Workup revealed IVH. Neurosurgery had no recommended interventions. Also found to have L sartorius and left gluteus hematoma. Recovery has been complicated by worsening mental status on 7/14. CT scan did not show any acute changes. Palliative medicine consulted for goals of care.   Subjective: Chart reviewed including labs, progress notes, imaging from this and previous encounters.  Cortrak has been placed.  On eval patient did not respond to my questions. Mumbling incoherently.  I spoke with patient's daughter. She had discussion with her brother and they have decided to change patient's code status to DNR.  I discussed with attending Dr. Dino and bedside RN Graciella.   Review of Systems  Unable to perform ROS: Mental status change     Physical Exam Vitals and nursing note reviewed.  Constitutional:      Appearance: She is ill-appearing.     Comments: cachectic  Eyes:     Comments: Bruising around L eye  Cardiovascular:     Rate and Rhythm: Normal rate.  Skin:    General: Skin is warm and dry.  Psychiatric:     Comments: Mumbling, did not respond to questions             Vital Signs: BP 134/66 (BP Location: Right Arm)   Pulse (!) 103   Temp 98.2 F (36.8 C) (Oral)   Resp 16   Wt 38.4 kg   SpO2 100%    BMI 13.66 kg/m  SpO2: SpO2: 100 % O2 Device: O2 Device: Nasal Cannula O2 Flow Rate: O2 Flow Rate (L/min): 3 L/min  Intake/output summary:  Intake/Output Summary (Last 24 hours) at 10/26/2023 1242 Last data filed at 10/26/2023 1155 Gross per 24 hour  Intake 125 ml  Output --  Net 125 ml   LBM: Last BM Date : 10/21/23 Baseline Weight: Weight: 48.8 kg Most recent weight: Weight:  38.4 kg       Palliative Assessment/Data: PPS: 20% (cortrak)      Patient Active Problem List   Diagnosis Date Noted   Toxic metabolic encephalopathy 10/19/2023   Traumatic rhabdomyolysis (HCC) 10/19/2023   AKI (acute kidney injury) (HCC) 10/19/2023   Pressure injury of skin 10/19/2023   Intraventricular hemorrhage (HCC) 10/18/2023   Hyperlipidemia 05/05/2020   Hypertension 05/05/2020   OP (osteoporosis) 05/05/2020   Pain due to onychomycosis of toenails of both feet 05/05/2020   Lumbar facet arthropathy 11/06/2019   Neuropathic pain 12/14/2018   Chronic use of opiate for therapeutic purpose 12/14/2018   Chronic pain syndrome 12/14/2018   Thoracic radiculopathy 12/14/2018   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   Depression, prolonged 04/07/2018   Chronic respiratory failure (HCC)    Palliative care encounter    Protein-calorie malnutrition, severe 02/06/2018   Acute respiratory failure (HCC) 02/04/2018   COPD (chronic obstructive pulmonary disease) (HCC) 03/22/2017   Post herpetic neuralgia 07/30/2016   Hypercalcemia 02/17/2016   Chronic angle-closure glaucoma of both eyes, severe stage 01/20/2016   Age-related nuclear cataract of both eyes 12/17/2015   Pneumonia 11/17/2015   Long term current use of systemic steroids 02/14/2015   Pulmonary hypertension (HCC) 10/05/2013   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   Abnormal Pap smear of vagina 05/31/2012   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   Ovarian cancer (HCC) 12/11/2009   Pulmonary sarcoidosis  (HCC) 10/11/1999    Palliative Care Assessment & Plan    Assessment/Recommendations/Plan  IVH - Continue current interventions Daughter is hopeful for improvement with initiation of temporary feeding tube Code status changed to DNR/DNI Continue time for outcomes- if patient fails to improve will revisit comfort measures with daughter    Code Status:   Code Status: Limited: Do not attempt resuscitation (DNR) -DNR-LIMITED -Do Not Intubate/DNI    Prognosis:  Unable to determine  Discharge Planning: To Be Determined  Care plan was discussed with patient's daughter, attending MD and bedside RN.   Thank you for allowing the Palliative Medicine Team to assist in the care of this patient.   Time includes:   Preparing to see the patient (e.g., review of tests) Obtaining and/or reviewing separately obtained history Performing a medically necessary appropriate examination and/or evaluation Counseling and educating the patient/family/caregiver Ordering medications, tests, or procedures Referring and communicating with other health care professionals (when not reported separately) Documenting clinical information in the electronic or other health record Independently interpreting results (not reported separately) and communicating results to the patient/family/caregiver Care coordination (not reported separately) Clinical documentation  Cassondra Stain, AGNP-C Palliative Medicine   Please contact Palliative Medicine Team phone at [phone] for questions and concerns.

## 2023-10-26 NOTE — Progress Notes (Signed)
 Speech Language Pathology Treatment: Dysphagia  Patient Details Name: Autumn [name] MRN: [id number] DOB: 1945-11-03 Today's Date: 10/26/2023 Time: 9099-9072 SLP Time Calculation (min) (ACUTE ONLY): 27 min  Assessment / Plan / Recommendation Clinical Impression  Pt seen at bedside for skilled ST intervention targeting goals for swallow safety. Pt awake, mumbling incoherently (she was not doing this prior to today). Pt oriented to name and DOB, hospital setting. Voice quality clear. Pt accepted individual ice chips with good oral response and adequate pharyngeal elevation per palpation. Pt also accepted boluses of magic cup. Oral holding noted with both ice chips and puree, but no overt s/s aspiration. Oral cavity clear when checked with suction.   Pt tolerating minimal PO trials - insufficient to meet nutrition/hydration needs at this time. SLP will continue to follow to assess tolerance of PO intake and readiness for changes. Continue puree/thin when FULLY AWAKE, ALERT, and UPRIGHT. ORAL CARE before and after PO trials. Safe swallow precautions posted at [facility].   HPI HPI: 78yo female admitted 10/18/23 found down at home. Pt with encephalopathy and IVH. PMH: pulmonary sarcoidosis, COPD on 2L [place], HTN. 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      SLP Plan  Continue with current plan of care          Recommendations  Diet recommendations: Dysphagia 1 (puree);Thin liquid (ONLY WHEN PT IS FULLY AWAKE AND ALERT) Liquids provided via: Teaspoon;Cup;Straw (ONLY WHEN PT IS FULLY AWAKE AND ALERT) Medication Administration: Crushed with puree (ONLY WHEN PT IS FULLY AWAKE AND ALERT) Supervision: Full supervision/cueing for compensatory strategies;Trained caregiver to feed patient Compensations: Minimize environmental distractions;Slow rate;Small sips/bites;Other (Comment) (check oral cavity for clearing after ALL PO TRIALS) Postural Changes and/or Swallow Maneuvers: Seated upright 90  degrees;Upright 30-60 min after meal        Oral care before and after PO   Frequent or constant Supervision/Assistance Dysphagia, unspecified (R13.10)     Continue with current plan of care    Autumn [name] B. Autumn, MSP, CCC-SLP Speech Language Pathologist Office: [phone]  Autumn [name] 10/26/2023, 9:27 AM

## 2023-10-26 NOTE — TOC Progression Note (Signed)
 Transition of Care [facility]) - Progression Note    Patient Details  Name: Autumn [name] MRN: [id number] Date of Birth: January 20, 1946  [facility] of [facility]) CM/SW Contact  Autumn [name], Autumn [name] Phone Number: 10/26/2023, 10:25 AM  Clinical Narrative:   CSW following for discharge to SNF. Authorization expires today, but patient not medically stable; workup ongoing, cortrak placed today. CSW provided brief update to [employer]. CSW to follow.    Expected Discharge Plan: Skilled Nursing Facility Barriers to Discharge: Continued Medical Work up, English as a second language teacher, SNF Pending bed offer  Expected Discharge Plan and Services In-house Referral: Clinical Social Work   Post Acute Care Choice: Skilled Nursing Facility Living arrangements for the past 2 months: Single Family Home                                       Social Determinants of Health (SDOH) Interventions SDOH Screenings   Food Insecurity: Patient Unable To Answer (10/23/2023)  Housing: Unknown (10/23/2023)  Transportation Needs: Patient Unable To Answer (10/23/2023)  Utilities: Patient Unable To Answer (10/23/2023)  Depression (PHQ2-9): Low Risk  (10/10/2023)  Financial Resource Strain: Low Risk  (06/30/2023)   Received from [facility]  Social Connections: Patient Unable To Answer (10/23/2023)  Tobacco Use: Medium Risk (10/17/2023)    Readmission Risk Interventions     No data to display

## 2023-10-26 NOTE — Plan of Care (Signed)
  Problem: Clinical Measurements: Goal: Will remain free from infection Outcome: Progressing   Problem: Nutrition: Goal: Adequate nutrition will be maintained Outcome: Not Progressing   Problem: Elimination: Goal: Will not experience complications related to bowel motility Outcome: Progressing

## 2023-10-26 NOTE — Progress Notes (Signed)
 Cortrak Tube Team Note:  Asked to advance cortrak tube after x-ray. Cortrak tube remains in the left nare now at cm marking 76 after tube advancement.   No x-ray is required. RN may begin using tube.   If the tube becomes dislodged please keep the tube and contact the Cortrak team at [email or web address] for replacement.  If after hours and replacement cannot be delayed, place a NG tube and confirm placement with an abdominal x-ray.    Olivia Kenning, RD Registered Dietitian  See Amion for more information

## 2023-10-26 NOTE — Progress Notes (Signed)
 Progress Note   Patient: Autumn [name] FMW:[id number] DOB: 07-Mar-1946 DOA: 10/18/2023     8 DOS: the patient was seen and examined on 10/26/2023   Brief hospital course: 78 year old with past medical history significant for pulmonary sarcoidosis, COPD on 2 L nasal cannula as needed, hypertension presented to [facility] with encephalopathy after being found down at home by family. She was found to have IVH and was transferred to [facility] for further evaluation by neurosurgery. Patient was evaluated by neurosurgery who recommended no intervention. Patient was started on nicardipine  drip for hypertensive emergency. She subsequently was transitioned to oral medications. Patient developed right hand tremor, CT head was stable a spot EEG was negative. She was started on Keppra  and neurology was consulted. She was also found to have left sartorius left gluteus hematoma post fall. [facility] transitioned to [facility] on 7/10.  Patient has worsening mental status now and is at high risk of aspiration.   Assessment and Plan:  1-Intraventricular hemorrhage, mild hydrocephalus Acute toxic metabolic encephalopathy:  worsening mentation - CT scan of the head was done 04/19/2023 which showed moderate volume of intraventricular hemorrhage.  Mild hydrocephalus -Repeat CT was done on 10/19/2023: No significant interval change in intraventricular hemorrhage involving both lateral ventricles.  Associated ventriculomegaly, no apparently change - MRI brain: Moderate volume of acute intraventricular hemorrhage with associated hydrocephalus, relatively stable from prior. 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 -MRA angio head: Normal intracranial MRA. No large vessel occlusion, hemodynamically significant stenosis, or other vascular abnormality. No aneurysm. Acute IVH with associated hydrocephalus, not appreciably changed  from prior. -No anticoagulation therapy due to IVH.  -Carotid doppler: left ICA are consistent with a 1-39%  stenosis. Bilateral vertebral arteries demonstrate antegrade flow. Unable to adequately interrogate the right ICA or ECA  secondary  Worsening encephalopathy on 10/24/2023 so CT of the head without contrast was done which didn't show any new findings.  Patient will be kept n.p.o. because of high risk of aspiration -Palliative care consulted-family is leaning towards comfort/hospice if no improvement seen in her clinical status  Possible Sepsis: Fever of 100.5 today associated with tachycardia and AMS on 7/16. Ordered blood cultures, lactic acid and procalcitonin levels. Started empirically on IV Ceftriaxone . Etiology is unclear.Ordered a CXR. Ordered IVF with NS at 75 cc/hr.  Normocytic anemia: Received one unit of PRBC on 7/14. Hb stable. No evidence of active bleeding.   Dysphagia:  Extremely high risk of Aspiration.  Repeat assessment with speech therapist done on 10/24/2023.  Patient was orally holding breakfast items which were suctioned from the oral cavity.  NPO until awake and alert Will order a small bore feeding tube. Not a good candidate for PEG as her overall condition is not expected to improve.   Hypertensive emergency - Treated with Nicardipine  gtt.  - As needed intravenous hydralazine .  Holding oral antihypertensives    Acute uncomplicated UTI: Treated and resolved   History of pulmonary sarcoidosis Chronic prednisone  use -Continue 5 mg of prednisone , held now because of altered mentation. - Oxygen as needed - Continue Brovana , Yupelri  and Pulmicort    Rhabdomyolysis: Presented with a CK level of 11,000.  Downtrending CPK levels Treated with IV fluids   Left lower extremity intramuscular hematoma CT abdomen pelvis:  Asymmetric expansion of the left sartorius and gluteus muscles with overlying subcutaneous soft tissue stranding, suspicious for intramuscular  hematomas   Glaucoma: Altered -Evaluated by  Dr Octavia, the foreign body in question is consistent with shunt placement at [facility]. 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     Subjective: Patient was seen and examined at the bedside.  Appears more alert today and mumbling incomprehensibly. Appears anxious and uncomfortable.  Physical Exam: Vitals:   10/26/23 0008 10/26/23 0340 10/26/23 0739 10/26/23 0815  BP: (!) 163/54 (!) 177/69 (!) 164/70 (!) 125/98  Pulse: 99 (!) 107 (!) 101 (!) 105  Resp: 19 18 16    Temp: 98.3 F (36.8 C) 98.8 F (37.1 C) (!) 100.5 F (38.1 C) 99.6 F (37.6 C)  TempSrc: Oral Axillary Oral Axillary  SpO2: 93% 91% 100% 100%  Weight:       Constitutional: Altered mentation, anxious, unable to communicate effectively Eyes: PERRL, lids and conjunctivae normal ENMT: Mucous membranes are moist. Posterior pharynx clear of any exudate or lesions  Neck: normal, supple, no masses, no thyromegaly Respiratory: clear to auscultation bilaterally, no wheezing, no crackles. Normal respiratory effort. No accessory muscle use.  Cardiovascular: tachycardia, no murmurs / rubs / gallops. No extremity edema. 2+ pedal pulses. No carotid bruits.  Abdomen: no tenderness, no masses palpated. No hepatosplenomegaly. Bowel sounds positive.  Musculoskeletal: no clubbing / cyanosis. No joint deformity upper and lower extremities. Skin: no rashes, lesions, ulcers. No induration Neurologic: Altered patient, unable to communicate effectively, unable to examine motor and sensory systems. Psychiatric: Altered mentation Data Reviewed:  There are no new results to review at this time.  Family Communication: None at the  bedside  Disposition: Status is: Inpatient Remains inpatient appropriate because: Altered mentation, recent intraventricular hemorrhage  Planned Discharge Destination: Skilled nursing facility    Time spent: 44 minutes  Author: Deliliah Room, MD 10/26/2023 8:44 AM  For on call review [email or web address].

## 2023-10-26 NOTE — Procedures (Signed)
 Cortrak  Person Inserting Tube:  Kirk Sampley, Olivia SAUNDERS, RD Tube Type:  Cortrak - 43 inches Tube Size:  10 Tube Location:  Left nare Secured by: Bridle Technique Used to Measure Tube Placement:  Marking at nare/corner of mouth Cortrak Secured At:  60 cm   Cortrak Tube Team Note:  Consult received to place a Cortrak feeding tube.   X-ray is required. RN may begin using tube after confirmed tube placement.    If the tube becomes dislodged please keep the tube and contact the Cortrak team at [email or web address] for replacement.  If after hours and replacement cannot be delayed, place a NG tube and confirm placement with an abdominal x-ray.    Olivia Kenning, RD Registered Dietitian  See Amion for more information

## 2023-10-26 NOTE — Progress Notes (Signed)
 Nutrition Follow-up  DOCUMENTATION CODES:  Severe malnutrition in context of chronic illness, Underweight  INTERVENTION:  Initiate tube feeding via cortrak: Osmolite 1.2 at 45 ml/h (1080 ml per day) Start at 25 and advance by 10mL every 12 hours to reach goal Prosource TF20 60 ml 1x/d Free water : 75mL every 4 hours Provides 1376 kcal, 80 gm protein, 886 ml free water  daily (1336 mL free water , TF+flush) Pt is at risk for refeeding syndrome given severe malnutrition and underweight BMI. Monitor magnesium and phosphorus daily x 4 days, MD to replete as needed. 100mg  thiamine  x 7 days Continue current diet per SLP recommendations Encourage PO intake Feeding assistance when awake   NUTRITION DIAGNOSIS:  Severe Malnutrition related to chronic illness as evidenced by severe fat depletion, severe muscle depletion, percent weight loss. - remains applicable  GOAL:  Patient will meet greater than or equal to 90% of their needs - progressing   MONITOR:  PO intake, Supplement acceptance, Labs, Weight trends  REASON FOR ASSESSMENT:  Consult Enteral/tube feeding initiation and management  ASSESSMENT:  Pt presented to [facility] with encephalopathy after being found down at home. Pt found to have IVH and was transferred to [facility] for further evaluation by neurosurgery. PMH significant for pulmonary sarcoidosis, COPD, HTN, ovarian cancer s/p hysterectomy (1990s).  7/7: Presented to [facility] ED, imaging showed acute IVH 7/8 - Transferred to [facility] 7/11: s/p bedside swallow evaluation- diet downgraded to dysphagia 2 d/t level of fatigue 7/14 - SLP BSE, DYS 1, thin when pt fully awake 7/16 - cortrak tube placed  Pt resting in bed at the time of assessment. Mumbling - but not able to provide any meaningful responses. Cortrak placed this AM. X-ray obtained confirms gastric placement, RN gathering supplies to initiate feeds.  Pt will be at risk for refeeding due to her severe malnutrition and  underweight BMI. Will start and advance slowly to monitor labs for electrolyte abnormalities  PMT working with family, made DNR today with all other interventions being continued to monitor for improvement.   Admit weight: 48.8 kg  ? accuracy Current weight: 38.4 kg    Average Meal Intake: 7/12-7/13: 28% intake x 3 recorded meals  Nutritionally Relevant Medications: Scheduled Meds:  Ensure Plus High Protein  237 mL Oral TID BM   insulin  aspart  0-9 Units Subcutaneous Q4H   metoCLOPramide   10 mg Per Tube Q6H   mirtazapine   7.5 mg Oral QHS   multivitamin with minerals  1 tablet Oral Daily   polyethylene glycol  17 g Oral Daily   senna  1 tablet Oral Daily   Continuous Infusions:  sodium chloride      cefTRIAXone  (ROCEPHIN )  IV     feeding supplement (OSMOLITE 1.2 CAL)     PRN Meds: docusate sodium , ondansetron   Labs Reviewed: CBG ranges from 112-131 mg/dL over the last 24 hours HgbA1c 5.3% (7/10)  NUTRITION - FOCUSED PHYSICAL EXAM: Flowsheet Row Most Recent Value  Orbital Region Severe depletion  Upper Arm Region Severe depletion  Thoracic and Lumbar Region Severe depletion  Buccal Region Mild depletion  Temple Region Moderate depletion  Clavicle Bone Region Severe depletion  Clavicle and Acromion Bone Region Severe depletion  Scapular Bone Region Severe depletion  Dorsal Hand Severe depletion  Patellar Region Severe depletion  Anterior Thigh Region Severe depletion  Posterior Calf Region Severe depletion  Edema (RD Assessment) None  Hair Reviewed  Eyes Reviewed  Mouth Other (Comment)  [white patchy tongue]  Skin Reviewed  Nails Reviewed  Diet Order:   Diet Order             DIET - DYS 1 Room service appropriate? Yes; Fluid consistency: Thin  Diet effective now                   EDUCATION NEEDS:  No education needs have been identified at this time  Skin:  Skin Assessment: Skin Integrity Issues: Skin Integrity Issues:: Stage II Stage II: lower  vertebral column (2x2 cm)  Last BM:  7/16 - type 3  Height:  Ht Readings from Last 1 Encounters:  10/17/23 5' 6 (1.676 m)    Weight:  Wt Readings from Last 1 Encounters:  10/25/23 38.4 kg    Ideal Body Weight:  59.1 kg  BMI:  Body mass index is 13.66 kg/m.  Estimated Nutritional Needs:  Kcal:  1400-1600 Protein:  70-85g Fluid:  >/=1.5L    Vernell Lukes, RD, LDN, CNSC Registered Dietitian II Please reach out via secure chat

## 2023-10-27 DIAGNOSIS — I615 Nontraumatic intracerebral hemorrhage, intraventricular: Secondary | ICD-10-CM | POA: Diagnosis not present

## 2023-10-27 LAB — CBC WITH DIFFERENTIAL/PLATELET
Abs Immature Granulocytes: 0.12 K/uL — ABNORMAL HIGH (ref 0.00–0.07)
Basophils Absolute: 0 K/uL (ref 0.0–0.1)
Basophils Relative: 0 %
Eosinophils Absolute: 0 K/uL (ref 0.0–0.5)
Eosinophils Relative: 0 %
HCT: 25.1 % — ABNORMAL LOW (ref 36.0–46.0)
Hemoglobin: 7.5 g/dL — ABNORMAL LOW (ref 12.0–15.0)
Immature Granulocytes: 1 %
Lymphocytes Relative: 2 %
Lymphs Abs: 0.3 K/uL — ABNORMAL LOW (ref 0.7–4.0)
MCH: 28.3 pg (ref 26.0–34.0)
MCHC: 29.9 g/dL — ABNORMAL LOW (ref 30.0–36.0)
MCV: 94.7 fL (ref 80.0–100.0)
Monocytes Absolute: 1.7 K/uL — ABNORMAL HIGH (ref 0.1–1.0)
Monocytes Relative: 12 %
Neutro Abs: 12.6 K/uL — ABNORMAL HIGH (ref 1.7–7.7)
Neutrophils Relative %: 85 %
Platelets: 498 K/uL — ABNORMAL HIGH (ref 150–400)
RBC: 2.65 MIL/uL — ABNORMAL LOW (ref 3.87–5.11)
RDW: 15.6 % — ABNORMAL HIGH (ref 11.5–15.5)
WBC: 14.8 K/uL — ABNORMAL HIGH (ref 4.0–10.5)
nRBC: 0.2 % (ref 0.0–0.2)

## 2023-10-27 LAB — GLUCOSE, CAPILLARY
Glucose-Capillary: 129 mg/dL — ABNORMAL HIGH (ref 70–99)
Glucose-Capillary: 149 mg/dL — ABNORMAL HIGH (ref 70–99)
Glucose-Capillary: 168 mg/dL — ABNORMAL HIGH (ref 70–99)
Glucose-Capillary: 149 mg/dL — ABNORMAL HIGH (ref 70–99)
Glucose-Capillary: 147 mg/dL — ABNORMAL HIGH (ref 70–99)

## 2023-10-27 LAB — BASIC METABOLIC PANEL WITH GFR
Anion gap: 10 (ref 5–15)
BUN: 62 mg/dL — ABNORMAL HIGH (ref 8–23)
CO2: 31 mmol/L (ref 22–32)
Calcium: 9 mg/dL (ref 8.9–10.3)
Chloride: 106 mmol/L (ref 98–111)
Creatinine, Ser: 0.94 mg/dL (ref 0.44–1.00)
GFR, Estimated: >60 mL/min (ref >60)
Glucose, Bld: 143 mg/dL — ABNORMAL HIGH (ref 70–99)
Potassium: 4.1 mmol/L (ref 3.5–5.1)
Sodium: 147 mmol/L — ABNORMAL HIGH (ref 135–145)

## 2023-10-27 LAB — PHOSPHORUS: Phosphorus: 2.9 mg/dL (ref 2.5–4.6)

## 2023-10-27 LAB — MAGNESIUM: Magnesium: 3 mg/dL — ABNORMAL HIGH (ref 1.7–2.4)

## 2023-10-27 MED ORDER — OXYMETAZOLINE HCL 0.05 % NA SOLN
1.0000 | Freq: Two times a day (BID) | NASAL | Status: DC
  Filled 2023-10-27: qty 30

## 2023-10-27 MED ORDER — ACETAMINOPHEN 325 MG PO TABS
650.0000 mg | ORAL_TABLET | Freq: Once | ORAL | Status: AC
  Administered 2023-10-27: 650 mg via ORAL
  Filled 2023-10-27: qty 2

## 2023-10-27 MED ORDER — FREE WATER
125.0000 mL | Status: DC
  Administered 2023-10-27 (×3): 125 mL

## 2023-10-27 MED ORDER — SODIUM CHLORIDE 0.9 % IV SOLN: INTRAVENOUS | Status: DC

## 2023-10-31 LAB — CULTURE, BLOOD (ROUTINE X 2)
Culture: NO GROWTH
Culture: NO GROWTH
Special Requests: ADEQUATE
Special Requests: ADEQUATE

## 2023-11-08 ENCOUNTER — Ambulatory Visit: Admitting: Student in an Organized Health Care Education/Training Program

## 2023-11-11 NOTE — Progress Notes (Signed)
 Progress Note   Patient: Autumn [name] FMW:[id number] DOB: Aug 15, 1945 DOA: 10/18/2023     9 DOS: the patient was seen and examined on November 25, 2023   Brief hospital course: 78 year old with past medical history significant for pulmonary sarcoidosis, COPD on 2 L nasal cannula as needed, hypertension presented to [facility] with encephalopathy after being found down at home by family. She was found to have IVH and was transferred to [facility] for further evaluation by neurosurgery. Patient was evaluated by neurosurgery who recommended no intervention. Patient was started on nicardipine  drip for hypertensive emergency. She subsequently was transitioned to oral medications. Patient developed right hand tremor, CT head was stable a spot EEG was negative. She was started on Keppra  and neurology was consulted. She was also found to have left sartorius left gluteus hematoma post fall. Care transitioned to [facility] on 7/10.  Patient has worsening mental status now and is at high risk of aspiration.   Assessment and Plan:  1-Intraventricular hemorrhage, mild hydrocephalus Acute toxic metabolic encephalopathy:  worsening mentation - CT scan of the head was done 04/19/2023 which showed moderate volume of intraventricular hemorrhage.  Mild hydrocephalus -Repeat CT was done on 10/19/2023: No significant interval change in intraventricular hemorrhage involving both lateral ventricles.  Associated ventriculomegaly, no apparently change - MRI brain: Moderate volume of acute intraventricular hemorrhage with associated hydrocephalus, relatively stable from prior. 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 -MRA angio head: Normal intracranial MRA. No large vessel occlusion, hemodynamically significant stenosis, or other vascular abnormality. No aneurysm. Acute IVH with associated hydrocephalus, not appreciably changed  from prior. -No anticoagulation therapy due to IVH.  -Carotid doppler: left ICA are consistent with a 1-39%  stenosis. Bilateral vertebral arteries demonstrate antegrade flow. Unable to adequately interrogate the right ICA or ECA  secondary  Worsening encephalopathy on 10/24/2023 so CT of the head without contrast was done which didn't show any new findings.  Patient will be kept n.p.o. because of high risk of aspiration -Palliative care consulted-family is leaning towards comfort/hospice if no improvement seen in her clinical status  Possible Sepsis: Fever of 100.5 today associated with tachycardia and AMS on 7/16. Ordered blood cultures, lactic acid and procalcitonin levels. Started empirically on IV Ceftriaxone . Etiology is unclear.Ordered a CXR. Ordered IVF with NS at 75 cc/hr.  Normocytic anemia: Received one unit of PRBC on 7/14. Hb stable. No evidence of active bleeding.   Dysphagia:  Extremely high risk of Aspiration.  Repeat assessment with speech therapist done on 10/24/2023.  Patient was orally holding breakfast items which were suctioned from the oral cavity.  Cortrack placed on 10/26/2023 and patient has been started on tube feeds with Osmolite at 35 cc/h. Not a good candidate for PEG as her overall condition is not expected to improve.   Hypertensive emergency - Treated with Nicardipine  gtt.  - As needed intravenous hydralazine .  Holding oral antihypertensives    Acute uncomplicated UTI: Treated and resolved   History of pulmonary sarcoidosis Chronic prednisone  use -Continue 5 mg of prednisone , held now because of altered mentation. - Oxygen as needed - Continue Brovana , Yupelri  and Pulmicort    Rhabdomyolysis: Presented with a CK level of 11,000.  Downtrending CPK levels Treated with IV fluids   Left lower extremity intramuscular hematoma CT abdomen pelvis:  Asymmetric expansion of the left sartorius and gluteus muscles with overlying subcutaneous soft tissue stranding,  suspicious for intramuscular hematomas  Glaucoma: Altered -Evaluated by Dr Octavia, the foreign body in question is consistent with shunt placement at [facility]. 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  CODE STATUS: DNR/DNI.  Overall prognosis is guarded.     Subjective: Patient has low-grade temperature and has been persistently tachycardic.  Cortrack in place and patient is receiving Osmolite at 35 cc/h.  Physical Exam: Vitals:   10/26/23 2342 11-21-2023 0354 2023-11-21 0723 2023/11/21 0851  BP: (!) 181/66 (!) 155/57 (!) 157/64   Pulse: (!) 124 (!) 125 (!) 115   Resp:  18 20   Temp: 99.1 F (37.3 C) 98 F (36.7 C) 99.9 F (37.7 C)   TempSrc: Oral Oral Oral   SpO2: 91% 97% 100% 100%  Weight:       Constitutional: Altered mentation, unable to communicate effectively, responds with one-word occasionally Eyes: PERRL, slight left-sided periorbital bruising ENMT: Mucous membranes are moist. Posterior pharynx clear of any exudate or lesions  Neck: normal, supple, no masses, no thyromegaly Respiratory: clear to auscultation bilaterally, no wheezing, no crackles. Normal respiratory effort. No accessory muscle use.  Cardiovascular: tachycardia, pansystolic murmur best heard over the apical area. No extremity edema. 2+ pedal pulses. No carotid bruits.  Abdomen: no tenderness, no masses palpated. No hepatosplenomegaly. Bowel sounds positive.  Musculoskeletal: no clubbing / cyanosis. No joint deformity upper and lower extremities. Skin: no rashes, lesions, ulcers. No induration Neurologic: Altered mentation, unable to communicate effectively, unable to examine motor and sensory systems. Psychiatric: Altered mentation Data  Reviewed:  There are no new results to review at this time.  Family Communication: None at the bedside  Disposition: Status is: Inpatient Remains inpatient appropriate because: Altered mentation, recent intraventricular hemorrhage  Planned Discharge Destination: Skilled nursing facility    Time spent: 43 minutes  Author: Deliliah Room, MD November 21, 2023 9:37 AM  For on call review [email or web address].

## 2023-11-11 NOTE — Plan of Care (Addendum)
  I was paged/reported by patient's RN stating that telemetry called and informed patient has asystole at 9:50 pm.  Patient being evaluated at the bedside.  Unable to appreciate any pulse, heart sound and no lung sound.  Bilateral pupils nonreactive to light.  Unable to obtain any blood pressure and pulse ox.  Patient has been pronounced death at 10:05 PM 2023-11-19. Family has been updated by rapid response RN.   Latravious Levitt, MD [facility] 11-19-2023, 11:30 PM

## 2023-11-11 NOTE — Death Summary Note (Signed)
 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 She was found to have IVH and was transferred to [facility] for further evaluation by neurosurgery. Patient was evaluated by neurosurgery who recommended no intervention. Patient was started on nicardipine  drip for hypertensive emergency. She subsequently was transitioned to oral medications. Patient developed right hand tremor, CT head was stable and spot EEG was negative. She was started on Keppra  and neurology was consulted. She was also found to have left sartorius left gluteus hematoma post fall. Care transitioned to [facility]  hospitalist on 7/10   Intraventricular hemorrhage, mild hydrocephalus Acute toxic metabolic encephalopathy:  worsening mentation - CT scan of the head was done 04/19/2023 which showed moderate volume of intraventricular hemorrhage.  Mild hydrocephalus -Repeat CT was done on 10/19/2023: No significant interval change in intraventricular hemorrhage involving both lateral ventricles.  Associated ventriculomegaly, no apparently change - MRI brain: Moderate volume of acute  intraventricular hemorrhage with associated hydrocephalus, relatively stable from prior. 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 No large vessel occlusion, hemodynamically significant stenosis, or other vascular abnormality. No aneurysm. Acute IVH with associated hydrocephalus, not appreciably changed from prior. -No anticoagulation therapy due to IVH.  -Carotid doppler: left ICA are consistent with a 1-39%  stenosis. Bilateral vertebral arteries demonstrate antegrade flow.   Worsening encephalopathy on 10/24/2023 so CT of the head without contrast was done which didn't show any new findings. Patient was kept n.p.o. because of high risk of aspiration . Cortrack placed on 10/26/2023 and patient was started on tube feeds with Osmolite   Clinical condition continued to get worse and palliative care was consulted. Patient was made DNR/DNI after discussion with her daughter.  No heart sounds, fixed dilated pupils and no spontaneous breathing.She was pronounced dead at 10:05 pm on 2023-11-16. Family was informed by Rapid response RN.       Procedures: None  Consultations: Neurosurgery, Speech, Palliative, Neurology  The results of significant diagnostics from this hospitalization (including imaging, microbiology, ancillary and laboratory) are listed below for reference.   Significant Diagnostic Studies: DG Chest Port 1 View Result Date: 10/26/2023 CLINICAL DATA:  Dyspnea EXAM: PORTABLE CHEST 1 VIEW COMPARISON:  Chest x-ray performed October 26, 2023 FINDINGS: Similar appearance of upper lobe scarring and hilar retraction. Patient is rotated. Heart mediastinum are not significantly changed. An enteric tube is present which terminates in the upper abdomen. IMPRESSION: 1. Enteric tube is present which terminates in the upper abdomen. 2. Otherwise, similar appearance of the lungs. Electronically Signed   By: Maude Naegeli M.D.   On: 10/26/2023 12:48   DG CHEST PORT 1 VIEW Result Date: 10/26/2023 CLINICAL DATA:  Shortness of breath, sepsis EXAM: PORTABLE CHEST 1 VIEW COMPARISON:  02/12/2018.  CT 10/26/2021. FINDINGS: Heart is normal size. Aortic atherosclerosis. Bilateral upper lobe scarring with superior retraction of the hila bilaterally, unchanged. No definite acute confluent airspace opacities or effusions. No acute bony abnormality. IMPRESSION: Stable bilateral upper lobe scarring with retraction of the hila superiorly. No active disease. Electronically Signed   By: Franky Crease M.D.   On: 10/26/2023 11:38   DG Abd Portable 1V Result Date: 10/26/2023 CLINICAL DATA:  Feeding tube placement EXAM: PORTABLE ABDOMEN - 1 VIEW COMPARISON:  None Available. FINDINGS: Feeding tube coils in the fundus of the stomach. Nonobstructive bowel gas pattern. Visualized lung bases clear. IMPRESSION: Feeding tube coils in the fundus of the stomach. Electronically Signed   By: Franky Crease M.D.   On: 10/26/2023 11:20   CT HEAD WO CONTRAST ( ) Result Date: 10/24/2023 CLINICAL DATA:  Initial evaluation for acute encephalopathy. EXAM: CT HEAD WITHOUT CONTRAST TECHNIQUE: Contiguous axial images were obtained from the base of the skull through the vertex without intravenous contrast. RADIATION DOSE REDUCTION: This exam was performed according to the departmental dose-optimization program which includes automated exposure control, adjustment of the mA and/or kV according to patient size and/or use of iterative reconstruction technique. COMPARISON:  Comparison made with prior study from 10/19/2023 and earlier. FINDINGS: Brain: Persistent moderate volume intraventricular hemorrhage about the septum pellucidum and layering within the occipital horns of both lateral ventricles, overall slightly decreased from prior. Associated hydrocephalus again seen, not significantly changed or worsened by CT. No ventricular trapping. Mild diffuse loss of  cortical sulcation noted, consistent with a degree of associated cerebral edema. Basilar cisterns remain patent. No transtentorial herniation. No new intracranial hemorrhage. No other acute large vessel territory infarct. No mass lesion or midline shift. No extra-axial fluid collection. Vascular: No abnormal hyperdense vessel. Skull: Scalp soft tissues and calvarium demonstrate no new finding. Sinuses/Orbits: Globes orbital soft tissues demonstrate no new finding. 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 Paranasal sinuses are largely clear. No significant mastoid effusion. Other: None. IMPRESSION: 1. Persistent moderate volume intraventricular hemorrhage, overall slightly decreased from prior. Associated hydrocephalus, not significantly changed or worsened by CT. 2. Mild diffuse loss of cortical sulcation, consistent with a degree of associated cerebral edema, similar. Basilar cisterns remain patent. No transtentorial herniation. 3. No other new acute intracranial abnormality. Electronically Signed   By: Morene Hoard M.D.   On: 10/24/2023 09:52   VAS US  CAROTID Result Date: 10/23/2023 Carotid Arterial Duplex Study Patient Name:  HAILEYANN STAIGER  Date of Exam:   10/22/2023 Medical Rec #: [id number]       Accession #:    [id number] Date of Birth: Jul 19, 1945       Patient Gender: F Patient Age:   10 years Exam Location:  [facility] Procedure:      VAS US  CAROTID Referring Phys: ARY XU --------------------------------------------------------------------------------  Indications:       CVA (IVH). Risk Factors:      Hypertension, hyperlipidemia. Other Factors:     Pulmonary hypertension, pulmonary sarcoidosis,. Limitations        Today's exam was limited due to the body habitus of the                    patient and the patient's inability or unwillingness to                    cooperate. Patient also complaining of left neck  pain. Comparison Study:  No prior study on file Performing Technologist:  Alberta Lis RVS  Examination Guidelines: A complete evaluation includes B-mode imaging, spectral Doppler, color Doppler, and power Doppler as needed of all accessible portions of each vessel. Bilateral testing is considered an integral part of a complete examination. Limited examinations for reoccurring indications may be performed as noted.  Right Carotid Findings: +----------+--------+--------+--------+------------------+------------------+           PSV cm/sEDV cm/sStenosisPlaque DescriptionComments           +----------+--------+--------+--------+------------------+------------------+ CCA Prox  128     26                                intimal thickening +----------+--------+--------+--------+------------------+------------------+ CCA Distal119     18                                intimal thickening +----------+--------+--------+--------+------------------+------------------+ ICA Prox                                            Not visualized     +----------+--------+--------+--------+------------------+------------------+ ICA Mid                                             Not visualized     +----------+--------+--------+--------+------------------+------------------+ ICA Distal                                          Not visualized     +----------+--------+--------+--------+------------------+------------------+ ECA                                                 Not visualized     +----------+--------+--------+--------+------------------+------------------+ +----------+--------+-------+--------+-------------------+           PSV cm/sEDV cmsDescribeArm Pressure (mmHG) +----------+--------+-------+--------+-------------------+ [id number]                                         +----------+--------+-------+--------+-------------------+ +---------+--------+---+--------+--+ VertebralPSV cm/s129EDV cm/s17 +---------+--------+---+--------+--+   Left Carotid Findings: +----------+--------+--------+--------+------------------+------------------+           PSV cm/sEDV cm/sStenosisPlaque DescriptionComments           +----------+--------+--------+--------+------------------+------------------+ CCA Prox  126     27                                intimal thickening +----------+--------+--------+--------+------------------+------------------+ CCA Distal104     17                                intimal thickening +----------+--------+--------+--------+------------------+------------------+ ICA Prox  137     24      1-39%   calcific                             +----------+--------+--------+--------+------------------+------------------+  ICA Mid   134     27                                                   +----------+--------+--------+--------+------------------+------------------+ ICA Distal133     33                                                   +----------+--------+--------+--------+------------------+------------------+ ECA       80      3                                                    +----------+--------+--------+--------+------------------+------------------+ +----------+--------+--------+--------+-------------------+           PSV cm/sEDV cm/sDescribeArm Pressure (mmHG) +----------+--------+--------+--------+-------------------+ [id number]             Stenotic                    +----------+--------+--------+--------+-------------------+ +---------+--------+---+--------+--+ VertebralPSV cm/s122EDV cm/s27 +---------+--------+---+--------+--+   Summary: Right Carotid: Unable to adequately interrogate the right ICA or ECA secondary                to above stated limitations. Left Carotid: Velocities in the left ICA are consistent with a 1-39% stenosis. Vertebrals:  Bilateral vertebral arteries demonstrate antegrade flow. Subclavians: Left subclavian artery was stenotic. Normal flow  hemodynamics were              seen in the right subclavian artery. *See table(s) above for measurements and observations.  Electronically signed by Eather Popp MD on 10/23/2023 at 11:59:30 AM.    Final    MR ANGIO HEAD WO CONTRAST Result Date: 10/21/2023 CLINICAL DATA:  Follow-up examination for hemorrhagic stroke. EXAM: MRA HEAD WITHOUT CONTRAST TECHNIQUE: Angiographic images of the Circle of Willis were acquired using MRA technique without intravenous contrast. COMPARISON:  Prior MRI from 10/19/2023 FINDINGS: Anterior circulation: Both internal carotid arteries are patent through the siphons without hemodynamically significant stenosis or other abnormality. A1 segments patent bilaterally. Normal anterior communicating artery complex. Anterior cerebral arteries patent without significant stenosis. No M1 stenosis or occlusion. Distal MCA branches perfused and symmetric. No beading or irregularity. Posterior circulation: Visualized distal V4 segments patent without stenosis. Neither PICA origin visualized. Basilar patent without stenosis. Superior cerebellar and posterior cerebral arteries widely patent bilaterally. No beading or irregularity. Anatomic variants: None significant. Other: No intracranial aneurysm or vascular malformation. Previously identified acute IVH with associated hydrocephalus noted, grossly similar to prior. IMPRESSION: 1. Normal intracranial MRA. No large vessel occlusion, hemodynamically significant stenosis, or other vascular abnormality. No aneurysm. 2. Acute IVH with associated hydrocephalus, not appreciably changed from prior. Electronically Signed   By: Morene Hoard M.D.   On: 10/21/2023 05:56   MR BRAIN W WO CONTRAST Result Date: 10/19/2023 CLINICAL DATA:  Initial evaluation for acute neuro deficit, stroke suspected. EXAM: MRI HEAD WITHOUT AND WITH CONTRAST TECHNIQUE: Multiplanar, multiecho pulse sequences of the brain and surrounding structures were obtained without and  with intravenous contrast. CONTRAST:  4mL GADAVIST  GADOBUTROL  1 MMOL/ML IV SOLN COMPARISON:  CT  from earlier the same day. FINDINGS: Brain: Mild age-related cerebral atrophy. Patchy and confluent T2/FLAIR hyperintensity involving the periventricular deep white matter both cerebral hemispheres, most characteristic of chronic microvascular ischemic disease, moderately advanced in nature. Patchy involvement of the pons noted. No evidence for acute or subacute infarct. Gray-white matter differentiation maintained. No areas of chronic cortical infarction. Moderate volume acute hemorrhage again seen within the lateral ventricles. Associated blood seen layering within the occipital horns bilaterally. Associated hydrocephalus, relatively stable from prior. Probable mild transependymal flow about the frontal and occipital horns of both lateral ventricles. Following contrast administration, small focus of enhancement within the central aspect of the hemorrhage is felt to be consistent with a small vessel (series 11, image 30). No other underlying mass lesion or other vascular abnormality. No other mass lesion, midline shift or mass effect. No extra-axial fluid collection. Pituitary gland suprasellar region within normal limits. No other abnormal enhancement. Vascular: Major intracranial vascular flow voids are maintained. Skull and upper cervical spine: Craniocervical junction within normal limits. Bone marrow signal intensity normal. No scalp soft tissue abnormality. Sinuses/Orbits: Prior ocular lens replacement with glaucoma shunt at the left globe. Globes and orbital soft tissues demonstrate no other acute finding. Paranasal sinuses are clear. No mastoid effusion. Other: None. IMPRESSION: 1. Moderate volume acute intraventricular hemorrhage with associated hydrocephalus, relatively stable from prior. Small focus of enhancement imbedded within this hemorrhage felt to be consistent with a small vessel. No other underlying  lesion or other vascular abnormality. 2. No other acute intracranial abnormality. 3. Underlying age-related cerebral atrophy with moderate chronic microvascular ischemic disease. Electronically Signed   By: Morene Hoard M.D.   On: 10/19/2023 20:51   EEG adult Result Date: 10/19/2023 Shelton Arlin KIDD, MD     10/19/2023 11:00 AM Patient Name: CHEN HOLZMAN MRN: [id number] Epilepsy Attending: Arlin KIDD Shelton Referring Physician/Provider: Adele Song, MD Date: 10/19/2023 Duration: 24.16 mins Patient history: 78 y.o. female admitted with IVH, LLE weakness and encephalopathy. Early this AM, she was noted to have hand shaking movements. Repeat CT head was then obtained, which was stable. EEG to evaluate for seizure Level of alertness: Awake, drowsy AEDs during EEG study: LEV Technical aspects: This EEG study was done with scalp electrodes positioned according to the 10-20 International system of electrode placement. Electrical activity was reviewed with band pass filter of 1-70Hz , sensitivity of 7 uV/mm, display speed of 84mm/sec with a 60Hz  notched filter applied as appropriate. EEG data were recorded continuously and digitally stored.  Video monitoring was available and reviewed as appropriate. Description: The posterior dominant rhythm consists of 8-9 Hz activity of moderate voltage (25-35 uV) seen predominantly in posterior head regions, symmetric and reactive to eye opening and eye closing. Drowsiness was characterized by attenuation of the posterior background rhythm. EEG showed intermittent generalized 3 to 6 Hz theta-delta slowing. Hyperventilation and photic stimulation were not performed.   ABNORMALITY - Intermittent slow, generalized IMPRESSION: This study is suggestive of mild diffuse encephalopathy. No seizures or epileptiform discharges were seen throughout the recording. Priyanka KIDD Shelton   CT HEAD WO CONTRAST ( ) Result Date: 10/19/2023 CLINICAL DATA:  Follow-up examination for intracranial  hemorrhage. EXAM: CT HEAD WITHOUT CONTRAST TECHNIQUE: Contiguous axial images were obtained from the base of the skull through the vertex without intravenous contrast. RADIATION DOSE REDUCTION: This exam was performed according to the departmental dose-optimization program which includes automated exposure control, adjustment of the mA and/or kV according to patient size and/or use of iterative reconstruction technique. COMPARISON:  CT from 10/18/2023 FINDINGS: Brain: Acute intraventricular hemorrhage involving both lateral ventricles again seen, not significantly changed in appearance of or volume since previous. Trace extension into the third ventricle noted. Associated ventriculomegaly, not appreciably changed. No significant midline shift. No ventricular trapping. No new intracranial hemorrhage. No acute large vessel territory infarct. No mass lesion or midline shift. No extra-axial fluid collection. Chronic microvascular ischemic disease again noted. Vascular: No abnormal hyperdense vessel. Skull: Scalp soft tissues and calvarium demonstrate no new finding. Sinuses/Orbits: Globes and orbital soft tissues demonstrate no new finding. Paranasal sinuses are largely clear. No mastoid effusion. Other: None. IMPRESSION: 1. No significant interval change in acute intraventricular hemorrhage involving both lateral ventricles. Associated ventriculomegaly, not appreciably changed. No midline shift or ventricular trapping. 2. No other new acute intracranial abnormality. Electronically Signed   By: Morene Hoard M.D.   On: 10/19/2023 02:35   CT HEAD WO CONTRAST ( ) Result Date: 10/18/2023 EXAM: CT HEAD WITHOUT 10/18/2023 TECHNIQUE: CT of the head was performed without the administration of intravenous contrast. Automated exposure control, iterative reconstruction, and/or weight based adjustment of the mA/kV was utilized to reduce the radiation dose to as low as reasonably achievable. COMPARISON: CT head without  contrast 10/17/2023. CLINICAL HISTORY: Follow up intraventricular hemorrhage. FINDINGS: BRAIN AND VENTRICLES: Bilateral intraventricular hemorrhage is stable in size since the prior exam. Ventricular enlargement is stable as well. Layering blood is present in the posterior horns bilaterally. Mild generalized atrophy and white matter disease is again noted. No acute infarcts are present. ORBITS: No acute abnormality. SINUSES AND MASTOIDS: No acute abnormality. SOFT TISSUES AND SKULL: No acute skull fracture. No acute soft tissue abnormality. Atherosclerotic calcifications are present within the cavernous internal carotid arteries bilaterally. No hyperdense vessel is present. Metallic fragment is again noted in the left lobe. Morphine replacement is present. IMPRESSION: 1. Stable bilateral intraventricular hemorrhage with layering blood in the posterior horns bilaterally. 2. Stable ventricular enlargement. 3. Mild generalized atrophy and white matter disease. No acute infarcts. Electronically signed by: Lonni Necessary MD 10/18/2023 12:41 PM EDT RP Workstation: [id number]   CT ABDOMEN PELVIS WO CONTRAST Result Date: 10/17/2023 CLINICAL DATA:  One-week history of generalized weakness, generalized abdominal pain and leukocytosis. Patient found down. EXAM: CT ABDOMEN AND PELVIS WITHOUT CONTRAST TECHNIQUE: Multidetector CT imaging of the abdomen and pelvis was performed following the standard protocol without IV contrast. RADIATION DOSE REDUCTION: This exam was performed according to the departmental dose-optimization program which includes automated exposure control, adjustment of the mA and/or kV according to patient size and/or use of iterative reconstruction technique. COMPARISON:  CT abdomen and pelvis dated 06/27/2021 FINDINGS: Lower chest: No focal consolidation or pulmonary nodule in the lung bases. No pleural effusion or pneumothorax demonstrated. Partially imaged heart size is normal. Hepatobiliary: No  focal hepatic lesions. No intra or extrahepatic biliary ductal dilation. Normal gallbladder. Pancreas: No focal lesions or main ductal dilation. Spleen: Normal in size. Peripheral splenic calcification, which may reflect sequela of prior insult. Adrenals/Urinary Tract: No definite adrenal nodules. No suspicious renal mass on this noncontrast enhanced examination or hydrocephalus. Bilateral punctate nonobstructing stones. Right upper pole subcentimeter hypodensity, likely a hemorrhagic/proteinaceous cyst. Exophytic left lower pole simple cyst measures 1.4 cm (2:31). No focal bladder wall thickening. Stomach/Bowel: Normal appearance of the stomach. No abnormal mural thickening. Gas-filled mildly dilated rectum. Appendix is not discretely seen. Vascular/Lymphatic: Aortic atherosclerosis. No enlarged abdominal or pelvic lymph nodes. Reproductive: No adnexal masses. Other: Trace pelvic free fluid.  No free air or fluid collection.  Musculoskeletal: No acute or abnormal lytic or blastic osseous lesions. Asymmetrically expanded left sartorius muscle contains a heterogeneous mass measuring 5.0 x 3.3 cm (2:76) and asymmetric expansion and heterogeneity of the left gluteus musculature (2:77). Overlying subcutaneous soft tissue stranding. IMPRESSION: 1. Asymmetric expansion of the left sartorius and gluteus muscles with overlying subcutaneous soft tissue stranding, suspicious for intramuscular hematomas in the setting of possible trauma. 2. Gas-filled mildly dilated rectum, which may reflect ileus. 3. Bilateral punctate nonobstructing renal stones. 4.  Aortic Atherosclerosis (ICD10-I70.0). Electronically Signed   By: Limin  Xu M.D.   On: 10/17/2023 20:24   CT Head Wo Contrast Result Date: 10/17/2023 CLINICAL DATA:  Ground-level fall with suspected head injury and confusion, evaluate for ICH; Ground-level fall with head and neck injury along with confusion evaluating for cervical spine trauma; Ground-level fall with left eye  periorbital hematoma and confusion, evaluate for any facial fractures EXAM: CT HEAD WITHOUT CONTRAST CT MAXILLOFACIAL WITHOUT CONTRAST CT CERVICAL SPINE WITHOUT CONTRAST TECHNIQUE: Multidetector CT imaging of the head, cervical spine, and maxillofacial structures were performed using the standard protocol without intravenous contrast. Multiplanar CT image reconstructions of the cervical spine and maxillofacial structures were also generated. RADIATION DOSE REDUCTION: This exam was performed according to the departmental dose-optimization program which includes automated exposure control, adjustment of the mA and/or kV according to patient size and/or use of iterative reconstruction technique. COMPARISON:  None Available. FINDINGS: CT HEAD FINDINGS Brain: Moderate volume acute intraventricular hemorrhage including hemorrhage/clot in the upper third ventricle, foramen of Monro and lateral ventricles bilaterally. Suspected mild hydrocephalus with mild rounding of the temporal. No evidence of acute intraparenchymal hemorrhage acute large vascular territory infarct, mass lesion, or midline shift. Patchy white matter hypodensities are nonspecific but compatible with microvascular ischemic disease. Vascular: Calcific atherosclerosis. Skull: No acute fracture. Other: No mastoid effusions. CT MAXILLOFACIAL FINDINGS Osseous: No fracture or mandibular dislocation. No destructive process. Orbits: Approximately 3 mm metallic density along the anterior left globe and in the right aspect of the anterior chamber, concerning for foreign body. The globes are otherwise unremarkable. Normal appearance of the retro bulbar fat extraocular muscles and lacrimal glands. Sinuses: Mostly clear sinuses.  No air-fluid levels. Soft tissues: Negative. CT CERVICAL SPINE FINDINGS Alignment: Reversal of the normal cervical lordosis. Anterolisthesis C3 on C4, likely degenerative given marked facet arthropathy. Otherwise, no substantial sagittal  subluxation. Skull base and vertebrae: No acute fracture. No primary bone lesion or focal pathologic process. Soft tissues and spinal canal: No prevertebral fluid or swelling. No visible canal hematoma. Disc levels: Moderate to severe degenerative change. Upper chest: Partially imaged biapical scarring. IMPRESSION: 1. Moderate volume of acute intraventricular hemorrhage, detailed above. 2. Suspected mild hydrocephalus. 3. Approximately 3 mm metallic density in the anterior left globe at the anterior chamber, suspicious for foreign body. Recommend correlation with direct inspection and ophthalmology consultation. 4. No facial fracture. 5. No evidence of acute fracture or traumatic in the cervical spine. Findings discussed with Dr. Malvina via telephone at 7:43 p.m. Electronically Signed   By: Gilmore GORMAN Molt M.D.   On: 10/17/2023 19:44   CT Cervical Spine Wo Contrast Result Date: 10/17/2023 CLINICAL DATA:  Ground-level fall with suspected head injury and confusion, evaluate for ICH; Ground-level fall with head and neck injury along with confusion evaluating for cervical spine trauma; Ground-level fall with left eye periorbital hematoma and confusion, evaluate for any facial fractures EXAM: CT HEAD WITHOUT CONTRAST CT MAXILLOFACIAL WITHOUT CONTRAST CT CERVICAL SPINE WITHOUT CONTRAST TECHNIQUE: Multidetector CT imaging of  the head, cervical spine, and maxillofacial structures were performed using the standard protocol without intravenous contrast. Multiplanar CT image reconstructions of the cervical spine and maxillofacial structures were also generated. RADIATION DOSE REDUCTION: This exam was performed according to the departmental dose-optimization program which includes automated exposure control, adjustment of the mA and/or kV according to patient size and/or use of iterative reconstruction technique. COMPARISON:  None Available. FINDINGS: CT HEAD FINDINGS Brain: Moderate volume acute intraventricular hemorrhage  including hemorrhage/clot in the upper third ventricle, foramen of Monro and lateral ventricles bilaterally. Suspected mild hydrocephalus with mild rounding of the temporal. No evidence of acute intraparenchymal hemorrhage acute large vascular territory infarct, mass lesion, or midline shift. Patchy white matter hypodensities are nonspecific but compatible with microvascular ischemic disease. Vascular: Calcific atherosclerosis. Skull: No acute fracture. Other: No mastoid effusions. CT MAXILLOFACIAL FINDINGS Osseous: No fracture or mandibular dislocation. No destructive process. Orbits: Approximately 3 mm metallic density along the anterior left globe and in the right aspect of the anterior chamber, concerning for foreign body. The globes are otherwise unremarkable. Normal appearance of the retro bulbar fat extraocular muscles and lacrimal glands. Sinuses: Mostly clear sinuses.  No air-fluid levels. Soft tissues: Negative. CT CERVICAL SPINE FINDINGS Alignment: Reversal of the normal cervical lordosis. Anterolisthesis C3 on C4, likely degenerative given marked facet arthropathy. Otherwise, no substantial sagittal subluxation. Skull base and vertebrae: No acute fracture. No primary bone lesion or focal pathologic process. Soft tissues and spinal canal: No prevertebral fluid or swelling. No visible canal hematoma. Disc levels: Moderate to severe degenerative change. Upper chest: Partially imaged biapical scarring. IMPRESSION: 1. Moderate volume of acute intraventricular hemorrhage, detailed above. 2. Suspected mild hydrocephalus. 3. Approximately 3 mm metallic density in the anterior left globe at the anterior chamber, suspicious for foreign body. Recommend correlation with direct inspection and ophthalmology consultation. 4. No facial fracture. 5. No evidence of acute fracture or traumatic in the cervical spine. Findings discussed with Dr. Malvina via telephone at 7:43 p.m. Electronically Signed   By: Gilmore GORMAN Molt  M.D.   On: 10/17/2023 19:44   CT Maxillofacial Wo Contrast Result Date: 10/17/2023 CLINICAL DATA:  Ground-level fall with suspected head injury and confusion, evaluate for ICH; Ground-level fall with head and neck injury along with confusion evaluating for cervical spine trauma; Ground-level fall with left eye periorbital hematoma and confusion, evaluate for any facial fractures EXAM: CT HEAD WITHOUT CONTRAST CT MAXILLOFACIAL WITHOUT CONTRAST CT CERVICAL SPINE WITHOUT CONTRAST TECHNIQUE: Multidetector CT imaging of the head, cervical spine, and maxillofacial structures were performed using the standard protocol without intravenous contrast. Multiplanar CT image reconstructions of the cervical spine and maxillofacial structures were also generated. RADIATION DOSE REDUCTION: This exam was performed according to the departmental dose-optimization program which includes automated exposure control, adjustment of the mA and/or kV according to patient size and/or use of iterative reconstruction technique. COMPARISON:  None Available. FINDINGS: CT HEAD FINDINGS Brain: Moderate volume acute intraventricular hemorrhage including hemorrhage/clot in the upper third ventricle, foramen of Monro and lateral ventricles bilaterally. Suspected mild hydrocephalus with mild rounding of the temporal. No evidence of acute intraparenchymal hemorrhage acute large vascular territory infarct, mass lesion, or midline shift. Patchy white matter hypodensities are nonspecific but compatible with microvascular ischemic disease. Vascular: Calcific atherosclerosis. Skull: No acute fracture. Other: No mastoid effusions. CT MAXILLOFACIAL FINDINGS Osseous: No fracture or mandibular dislocation. No destructive process. Orbits: Approximately 3 mm metallic density along the anterior left globe and in the right aspect of the anterior chamber, concerning for  foreign body. The globes are otherwise unremarkable. Normal appearance of the retro bulbar fat  extraocular muscles and lacrimal glands. Sinuses: Mostly clear sinuses.  No air-fluid levels. Soft tissues: Negative. CT CERVICAL SPINE FINDINGS Alignment: Reversal of the normal cervical lordosis. Anterolisthesis C3 on C4, likely degenerative given marked facet arthropathy. Otherwise, no substantial sagittal subluxation. Skull base and vertebrae: No acute fracture. No primary bone lesion or focal pathologic process. Soft tissues and spinal canal: No prevertebral fluid or swelling. No visible canal hematoma. Disc levels: Moderate to severe degenerative change. Upper chest: Partially imaged biapical scarring. IMPRESSION: 1. Moderate volume of acute intraventricular hemorrhage, detailed above. 2. Suspected mild hydrocephalus. 3. Approximately 3 mm metallic density in the anterior left globe at the anterior chamber, suspicious for foreign body. Recommend correlation with direct inspection and ophthalmology consultation. 4. No facial fracture. 5. No evidence of acute fracture or traumatic in the cervical spine. Findings discussed with Dr. Malvina via telephone at 7:43 p.m. Electronically Signed   By: Gilmore GORMAN Molt M.D.   On: 10/17/2023 19:44   DG Hip Unilat W or Wo Pelvis 2-3 Views Left Result Date: 10/17/2023 CLINICAL DATA:  Fall, left hip pain EXAM: DG HIP (WITH OR WITHOUT PELVIS) 2-3V LEFT COMPARISON:  None Available. FINDINGS: Hip joints and SI joints symmetric. No acute bony abnormality. Specifically, no fracture, subluxation, or dislocation. IMPRESSION: No acute bony abnormality. Electronically Signed   By: Franky Crease M.D.   On: 10/17/2023 19:11   DG PAIN CLINIC C-ARM 1-60 MIN NO REPORT Result Date: 10/10/2023 Fluoro was used, but no Radiologist interpretation will be provided. Please refer to NOTES tab for provider progress note.   Microbiology: Recent Results (from the past 240 hours)  Culture, blood (Routine X 2) w Reflex to ID Panel     Status: None (Preliminary result)   Collection Time:  10/26/23  9:17 AM   Specimen: BLOOD LEFT ARM  Result Value Ref Range Status   Specimen Description BLOOD LEFT ARM  Final   Special Requests   Final    BOTTLES DRAWN AEROBIC AND ANAEROBIC Blood Culture adequate volume   Culture   Final    NO GROWTH 2 DAYS Performed at [facility], [address], [place], [place] [address]    Report Status PENDING  Incomplete  Culture, blood (Routine X 2) w Reflex to ID Panel     Status: None (Preliminary result)   Collection Time: 10/26/23  9:19 AM   Specimen: BLOOD RIGHT ARM  Result Value Ref Range Status   Specimen Description BLOOD RIGHT ARM  Final   Special Requests   Final    BOTTLES DRAWN AEROBIC AND ANAEROBIC Blood Culture adequate volume   Culture   Final    NO GROWTH 2 DAYS Performed at [facility], [address], [place], [place] [address]    Report Status PENDING  Incomplete    Time spent: 43 minutes  Signed: Deliliah Room, MD 10/28/23

## 2023-11-11 NOTE — Progress Notes (Signed)
 Physical Therapy Treatment Patient Details Name: Autumn [name] MRN: [id number] DOB: [birth year] Today's Date: 11-22-2023   History of Present Illness Patient is a 78 y/o female admitted 10/18/23 due to L LE weakness, encephalopathy found to have IVH  both lateral ventricles.  PMH positive for COPD, arthritis, glaucoma, HTN, ovarian cancer, postherpetic neuralgia, pulmonary sarcoidosis.    PT Comments  Pt received in supine and agreeable to session. Pt responds appropriately to questions intermittently, however mostly mumbles unintelligibly throughout session.  Pt able to tolerate sitting EOB for ~8 mins with min-mod A for balance. Pt unable to follow any cues while sitting EOB for exercises. Pt continues to benefit from PT services to progress toward functional mobility goals.    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 Restrictions Weight Bearing Restrictions Per Provider Order: No     Mobility  Bed Mobility Overal bed mobility: Needs Assistance Bed Mobility: Supine to Sit, Sit to Supine     Supine to sit: HOB elevated, Max assist Sit to supine: Total assist, HOB elevated   General bed mobility comments: max-total A due to very limited command following    Transfers                   General transfer comment: deferred due to limited command following and poor sitting balance    Ambulation/Gait                   Stairs             Wheelchair  Mobility     Tilt Bed    Modified Rankin (Stroke Patients Only) Modified Rankin (Stroke Patients Only) Pre-Morbid Rankin Score: Slight disability Modified Rankin: Severe disability     Balance Overall balance assessment: Needs assistance Sitting-balance support: Feet supported, Single extremity supported, No upper extremity supported Sitting balance-Leahy Scale: Poor Sitting balance - Comments: min-mod A required throughout with some righting responses noted with posterior lean, but pt requires assist to correct       Standing balance comment: nt                            Communication Communication Communication: No apparent difficulties Factors Affecting Communication: Difficulty expressing self;Reduced clarity of speech  Cognition Arousal: Lethargic Behavior During Therapy: Flat affect   PT - Cognitive impairments: Orientation, Attention, Initiation, Problem solving, Safety/Judgement                       PT - Cognition Comments: Pt mumbling throughout session and does not open eyes despite cues Following commands: Impaired Following commands impaired: Follows one step commands inconsistently    Cueing Cueing Techniques: Verbal cues, Gestural cues, Tactile cues, Visual cues  Exercises      General Comments        Pertinent Vitals/Pain Pain Assessment Pain Assessment: No/denies pain     PT Goals (current goals can now be found in the care plan section) Acute Rehab PT Goals Patient Stated  Goal: return home PT Goal Formulation: With patient/family Time For Goal Achievement: 11/02/23 Progress towards PT goals: Not progressing toward goals - comment (limited by impaired cognition and command following)    Frequency    Min 2X/week       AM-PAC PT 6 Clicks Mobility   Outcome Measure  Help needed turning from your back to your side while in a flat bed without using bedrails?: A Lot Help needed moving from lying on your back to  sitting on the side of a flat bed without using bedrails?: Total Help needed moving to and from a bed to a chair (including a wheelchair)?: Total Help needed standing up from a chair using your arms (e.g., wheelchair or bedside chair)?: Total Help needed to walk in hospital room?: Total Help needed climbing 3-5 steps with a railing? : Total 6 Click Score: 7    End of Session   Activity Tolerance: Patient limited by fatigue;Other (comment) (limited command following) Patient left: in bed;with call bell/phone within reach;with bed alarm set Nurse Communication: Mobility status PT Visit Diagnosis: Other abnormalities of gait and mobility (R26.89);Other symptoms and signs involving the nervous system (R29.898);Muscle weakness (generalized) (M62.81);History of falling (Z91.81)     Time: 8958-8943 PT Time Calculation (min) (ACUTE ONLY): 15 min  Charges:    $Therapeutic Activity: 8-22 mins PT General Charges $$ ACUTE PT VISIT: 1 Visit                     Darryle George, PTA Acute Rehabilitation Services Secure Chat Preferred  [phone]    Darryle George Nov 24, 2023, 12:17 PM

## 2023-11-11 NOTE — Plan of Care (Signed)
   Problem: Nutrition: Goal: Adequate nutrition will be maintained Outcome: Progressing   Problem: Elimination: Goal: Will not experience complications related to bowel motility Outcome: Progressing Goal: Will not experience complications related to urinary retention Outcome: Progressing   Problem: Skin Integrity: Goal: Risk for impaired skin integrity will decrease Outcome: Progressing

## 2023-11-11 NOTE — Plan of Care (Signed)

## 2023-11-11 NOTE — Progress Notes (Signed)
   Nov 18, 2023 1836  Provider Notification  Provider Name/Title Deliliah Room, MD  Date Provider Notified 11/18/2023  Time Provider Notified 1836  Method of Notification Page  Notification Reason Change in status (Tachypnoea, tachycardia, less responsive)  Provider response No new orders  Date of Provider Response Nov 18, 2023  Time of Provider Response [phone]

## 2023-11-11 DEATH — deceased

## 2023-11-29 ENCOUNTER — Ambulatory Visit: Admitting: Pulmonary Disease

## 2023-11-29 ENCOUNTER — Encounter

## 2024-04-25 ENCOUNTER — Ambulatory Visit: Payer: Medicare PPO
